# Patient Record
Sex: Male | Born: 1945
Health system: Southern US, Community
[De-identification: ages and names within clinical notes are randomized; demographics above are authoritative.]

## PROBLEM LIST (undated history)

## (undated) DIAGNOSIS — T783XXA Angioneurotic edema, initial encounter: Secondary | ICD-10-CM

## (undated) DIAGNOSIS — N400 Enlarged prostate without lower urinary tract symptoms: Secondary | ICD-10-CM

## (undated) DIAGNOSIS — H9190 Unspecified hearing loss, unspecified ear: Secondary | ICD-10-CM

## (undated) DIAGNOSIS — G8929 Other chronic pain: Secondary | ICD-10-CM

## (undated) DIAGNOSIS — M1990 Unspecified osteoarthritis, unspecified site: Secondary | ICD-10-CM

## (undated) DIAGNOSIS — Z9289 Personal history of other medical treatment: Secondary | ICD-10-CM

## (undated) DIAGNOSIS — Z9889 Other specified postprocedural states: Secondary | ICD-10-CM

## (undated) DIAGNOSIS — I639 Cerebral infarction, unspecified: Secondary | ICD-10-CM

## (undated) DIAGNOSIS — I10 Essential (primary) hypertension: Secondary | ICD-10-CM

## (undated) DIAGNOSIS — G629 Polyneuropathy, unspecified: Secondary | ICD-10-CM

## (undated) DIAGNOSIS — M549 Dorsalgia, unspecified: Secondary | ICD-10-CM

## (undated) DIAGNOSIS — I219 Acute myocardial infarction, unspecified: Secondary | ICD-10-CM

## (undated) DIAGNOSIS — N2889 Other specified disorders of kidney and ureter: Secondary | ICD-10-CM

## (undated) DIAGNOSIS — F419 Anxiety disorder, unspecified: Secondary | ICD-10-CM

## (undated) DIAGNOSIS — Z9689 Presence of other specified functional implants: Secondary | ICD-10-CM

## (undated) HISTORY — PX: ELBOW SURGERY: SHX618

## (undated) HISTORY — PX: COLONOSCOPY: SHX174

## (undated) HISTORY — PX: JOINT REPLACEMENT: SHX530

## (undated) HISTORY — PX: HERNIA REPAIR: SHX51

## (undated) HISTORY — PX: TONSILLECTOMY: SUR1361

## (undated) HISTORY — PX: VASECTOMY: SHX75

---
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
## 2002-07-25 ENCOUNTER — Encounter: Admission: RE | Admit: 2002-07-25 | Discharge: 2002-10-23 | Payer: Self-pay | Admitting: Anesthesiology

## 2012-12-13 DIAGNOSIS — Z79899 Other long term (current) drug therapy: Secondary | ICD-10-CM | POA: Insufficient documentation

## 2012-12-13 DIAGNOSIS — Z79891 Long term (current) use of opiate analgesic: Secondary | ICD-10-CM | POA: Insufficient documentation

## 2012-12-13 DIAGNOSIS — M1990 Unspecified osteoarthritis, unspecified site: Secondary | ICD-10-CM | POA: Insufficient documentation

## 2012-12-13 DIAGNOSIS — G894 Chronic pain syndrome: Secondary | ICD-10-CM | POA: Insufficient documentation

## 2012-12-30 DIAGNOSIS — M5136 Other intervertebral disc degeneration, lumbar region: Secondary | ICD-10-CM | POA: Insufficient documentation

## 2012-12-30 DIAGNOSIS — M47814 Spondylosis without myelopathy or radiculopathy, thoracic region: Secondary | ICD-10-CM | POA: Insufficient documentation

## 2014-08-14 ENCOUNTER — Other Ambulatory Visit (HOSPITAL_COMMUNITY): Payer: Self-pay | Admitting: Anesthesiology

## 2014-08-14 ENCOUNTER — Ambulatory Visit (HOSPITAL_COMMUNITY)
Admission: RE | Admit: 2014-08-14 | Discharge: 2014-08-14 | Disposition: A | Discharge status: Home / Self Care | Payer: Medicare Other | Source: Ambulatory Visit | Attending: Anesthesiology | Admitting: Anesthesiology

## 2014-08-14 DIAGNOSIS — Z4589 Encounter for adjustment and management of other implanted devices: Secondary | ICD-10-CM | POA: Diagnosis present

## 2014-08-14 DIAGNOSIS — I700 Atherosclerosis of aorta: Secondary | ICD-10-CM | POA: Diagnosis not present

## 2014-08-14 DIAGNOSIS — Z9689 Presence of other specified functional implants: Secondary | ICD-10-CM

## 2014-08-14 DIAGNOSIS — M5135 Other intervertebral disc degeneration, thoracolumbar region: Secondary | ICD-10-CM | POA: Diagnosis not present

## 2014-08-14 DIAGNOSIS — M4855XA Collapsed vertebra, not elsewhere classified, thoracolumbar region, initial encounter for fracture: Secondary | ICD-10-CM | POA: Diagnosis not present

## 2014-09-27 ENCOUNTER — Encounter (HOSPITAL_COMMUNITY): Payer: Self-pay | Admitting: *Deleted

## 2014-09-27 MED ORDER — CEFAZOLIN SODIUM-DEXTROSE 2-3 GM-% IV SOLR
2.0000 g | INTRAVENOUS | Status: AC
  Administered 2014-09-28: 2 g via INTRAVENOUS
  Filled 2014-09-27: qty 50

## 2014-09-27 MED ORDER — DEXAMETHASONE SODIUM PHOSPHATE 4 MG/ML IJ SOLN
8.0000 mg | Freq: Once | INTRAMUSCULAR | Status: AC
  Administered 2014-09-28: 8 mg via INTRAVENOUS
  Filled 2014-09-27: qty 2

## 2014-09-27 MED ORDER — MUPIROCIN 2 % EX OINT
1.0000 "application " | TOPICAL_OINTMENT | Freq: Once | CUTANEOUS | Status: AC
  Administered 2014-09-28: 1 via TOPICAL
  Filled 2014-09-27: qty 22

## 2014-09-27 MED ORDER — ACETAMINOPHEN 10 MG/ML IV SOLN: 1000.0000 mg | Freq: Four times a day (QID) | INTRAVENOUS | Status: DC

## 2014-09-27 NOTE — Progress Notes (Signed)
[place] and requested EKG that was done recently. They will fax it to [place].  Pt's PCP is Dr. [name]. Pt states medical clearance was sent to Dr. Rolena Infante' office.  Pt denies cardiac history, chest pain or sob. States he had a stress test "years ago" and it was normal.

## 2014-09-27 NOTE — Anesthesia Preprocedure Evaluation (Addendum)
Anesthesia Evaluation  Patient identified by MRN, date of birth, ID band Patient awake    Reviewed: Allergy & Precautions, NPO status , Patient's Chart, lab work & pertinent test results, reviewed documented beta blocker date and time   Airway Mallampati: II       Dental  (+) Teeth Intact   Pulmonary Current Smoker,          Cardiovascular hypertension, Pt. on medications Rhythm:Regular     Neuro/Psych Anxiety    GI/Hepatic negative GI ROS, Neg liver ROS,   Endo/Other  negative endocrine ROS  Renal/GU negative Renal ROS     Musculoskeletal   Abdominal (+)  Abdomen: soft.    Peds  Hematology   Anesthesia Other Findings   Reproductive/Obstetrics                            Anesthesia Physical Anesthesia Plan  ASA: II  Anesthesia Plan: General   Post-op Pain Management:    Induction: Intravenous  Airway Management Planned: Oral ETT  Additional Equipment:   Intra-op Plan:   Post-operative Plan: Extubation in OR  Informed Consent: I have reviewed the patients History and Physical, chart, labs and discussed the procedure including the risks, benefits and alternatives for the proposed anesthesia with the patient or authorized representative who has indicated his/her understanding and acceptance.     Plan Discussed with:   Anesthesia Plan Comments: (Check AM Labs and Am EKG)        Anesthesia Quick Evaluation

## 2014-09-28 ENCOUNTER — Encounter (HOSPITAL_COMMUNITY)
Admission: RE | Disposition: A | Discharge status: Home / Self Care | Payer: Medicare Other | Source: Ambulatory Visit | Attending: Orthopedic Surgery

## 2014-09-28 ENCOUNTER — Ambulatory Visit (HOSPITAL_COMMUNITY): Payer: Medicare Other | Admitting: Anesthesiology

## 2014-09-28 ENCOUNTER — Observation Stay (HOSPITAL_COMMUNITY)
Admission: RE | Admit: 2014-09-28 | Discharge: 2014-09-29 | Disposition: A | Discharge status: Home / Self Care | Payer: Medicare Other | Source: Ambulatory Visit | Attending: Orthopedic Surgery | Admitting: Orthopedic Surgery

## 2014-09-28 ENCOUNTER — Encounter (HOSPITAL_COMMUNITY): Payer: Self-pay | Admitting: *Deleted

## 2014-09-28 ENCOUNTER — Ambulatory Visit (HOSPITAL_COMMUNITY): Payer: Medicare Other

## 2014-09-28 DIAGNOSIS — M1990 Unspecified osteoarthritis, unspecified site: Secondary | ICD-10-CM | POA: Diagnosis not present

## 2014-09-28 DIAGNOSIS — Z87891 Personal history of nicotine dependence: Secondary | ICD-10-CM | POA: Insufficient documentation

## 2014-09-28 DIAGNOSIS — F419 Anxiety disorder, unspecified: Secondary | ICD-10-CM | POA: Insufficient documentation

## 2014-09-28 DIAGNOSIS — M545 Low back pain: Secondary | ICD-10-CM | POA: Diagnosis not present

## 2014-09-28 DIAGNOSIS — M79604 Pain in right leg: Secondary | ICD-10-CM | POA: Insufficient documentation

## 2014-09-28 DIAGNOSIS — G894 Chronic pain syndrome: Principal | ICD-10-CM | POA: Insufficient documentation

## 2014-09-28 DIAGNOSIS — I10 Essential (primary) hypertension: Secondary | ICD-10-CM | POA: Insufficient documentation

## 2014-09-28 DIAGNOSIS — G629 Polyneuropathy, unspecified: Secondary | ICD-10-CM | POA: Insufficient documentation

## 2014-09-28 DIAGNOSIS — M79605 Pain in left leg: Secondary | ICD-10-CM | POA: Insufficient documentation

## 2014-09-28 DIAGNOSIS — N400 Enlarged prostate without lower urinary tract symptoms: Secondary | ICD-10-CM | POA: Diagnosis not present

## 2014-09-28 DIAGNOSIS — Z419 Encounter for procedure for purposes other than remedying health state, unspecified: Secondary | ICD-10-CM

## 2014-09-28 DIAGNOSIS — G8929 Other chronic pain: Secondary | ICD-10-CM | POA: Diagnosis present

## 2014-09-28 HISTORY — DX: Unspecified osteoarthritis, unspecified site: M19.90

## 2014-09-28 HISTORY — DX: Anxiety disorder, unspecified: F41.9

## 2014-09-28 HISTORY — DX: Personal history of other medical treatment: Z92.89

## 2014-09-28 HISTORY — DX: Essential (primary) hypertension: I10

## 2014-09-28 HISTORY — DX: Polyneuropathy, unspecified: G62.9

## 2014-09-28 HISTORY — PX: SPINAL CORD STIMULATOR INSERTION: SHX5378

## 2014-09-28 HISTORY — DX: Benign prostatic hyperplasia without lower urinary tract symptoms: N40.0

## 2014-09-28 LAB — CBC
HCT: 39 % (ref 39.0–52.0)
Hemoglobin: 13.3 g/dL (ref 13.0–17.0)
MCH: 29.9 pg (ref 26.0–34.0)
MCHC: 34.1 g/dL (ref 30.0–36.0)
MCV: 87.6 fL (ref 78.0–100.0)
PLATELETS: 206 10*3/uL (ref 150–400)
RBC: 4.45 MIL/uL (ref 4.22–5.81)
RDW: 12.8 % (ref 11.5–15.5)
WBC: 7.2 10*3/uL (ref 4.0–10.5)

## 2014-09-28 LAB — BASIC METABOLIC PANEL
Anion gap: 10 (ref 5–15)
BUN: 14 mg/dL (ref 6–23)
CO2: 26 mmol/L (ref 19–32)
CREATININE: 1.1 mg/dL (ref 0.50–1.35)
Calcium: 9.2 mg/dL (ref 8.4–10.5)
Chloride: 102 mEq/L (ref 96–112)
GFR calc Af Amer: 79 mL/min — ABNORMAL LOW (ref >90)
GFR calc non Af Amer: 68 mL/min — ABNORMAL LOW (ref >90)
GLUCOSE: 102 mg/dL — AB (ref 70–99)
Potassium: 4 mmol/L (ref 3.5–5.1)
Sodium: 138 mmol/L (ref 135–145)

## 2014-09-28 LAB — SURGICAL PCR SCREEN
MRSA, PCR: NEGATIVE
Staphylococcus aureus: NEGATIVE

## 2014-09-28 SURGERY — INSERTION, SPINAL CORD STIMULATOR, LUMBAR
Anesthesia: General | Site: Back

## 2014-09-28 MED ORDER — DOCUSATE SODIUM 100 MG PO CAPS: 100.0000 mg | ORAL_CAPSULE | Freq: Three times a day (TID) | ORAL | Status: DC | PRN

## 2014-09-28 MED ORDER — ROCURONIUM BROMIDE 50 MG/5ML IV SOLN
INTRAVENOUS | Status: AC
  Filled 2014-09-28: qty 1

## 2014-09-28 MED ORDER — METHOCARBAMOL 500 MG PO TABS: 500.0000 mg | ORAL_TABLET | Freq: Three times a day (TID) | ORAL | Status: DC | PRN

## 2014-09-28 MED ORDER — GABAPENTIN 600 MG PO TABS
1200.0000 mg | ORAL_TABLET | Freq: Every day | ORAL | Status: DC
  Administered 2014-09-28: 1200 mg via ORAL
  Filled 2014-09-28 (×2): qty 2

## 2014-09-28 MED ORDER — PHENOL 1.4 % MT LIQD: 1.0000 | OROMUCOSAL | Status: DC | PRN

## 2014-09-28 MED ORDER — PROMETHAZINE HCL 25 MG/ML IJ SOLN: 6.2500 mg | INTRAMUSCULAR | Status: DC | PRN

## 2014-09-28 MED ORDER — FENTANYL CITRATE 0.05 MG/ML IJ SOLN
INTRAMUSCULAR | Status: AC
  Filled 2014-09-28: qty 2

## 2014-09-28 MED ORDER — SODIUM CHLORIDE 0.9 % IJ SOLN
3.0000 mL | Freq: Two times a day (BID) | INTRAMUSCULAR | Status: DC
  Administered 2014-09-28 (×2): 3 mL via INTRAVENOUS

## 2014-09-28 MED ORDER — FENTANYL CITRATE 0.05 MG/ML IJ SOLN
25.0000 ug | INTRAMUSCULAR | Status: DC | PRN
  Administered 2014-09-28 (×3): 50 ug via INTRAVENOUS

## 2014-09-28 MED ORDER — METHOCARBAMOL 500 MG PO TABS
500.0000 mg | ORAL_TABLET | Freq: Four times a day (QID) | ORAL | Status: DC | PRN
  Administered 2014-09-28 – 2014-09-29 (×2): 500 mg via ORAL
  Filled 2014-09-28 (×2): qty 1

## 2014-09-28 MED ORDER — SODIUM CHLORIDE 0.9 % IJ SOLN: 3.0000 mL | INTRAMUSCULAR | Status: DC | PRN

## 2014-09-28 MED ORDER — MIDAZOLAM HCL 5 MG/5ML IJ SOLN
INTRAMUSCULAR | Status: DC | PRN
  Administered 2014-09-28: 2 mg via INTRAVENOUS

## 2014-09-28 MED ORDER — POLYETHYLENE GLYCOL 3350 17 GM/SCOOP PO POWD: 17.0000 g | Freq: Every day | ORAL | Status: DC

## 2014-09-28 MED ORDER — EPHEDRINE SULFATE 50 MG/ML IJ SOLN
INTRAMUSCULAR | Status: DC | PRN
  Administered 2014-09-28: 10 mg via INTRAVENOUS

## 2014-09-28 MED ORDER — PROPOFOL 10 MG/ML IV BOLUS
INTRAVENOUS | Status: AC
  Filled 2014-09-28: qty 20

## 2014-09-28 MED ORDER — NEOSTIGMINE METHYLSULFATE 10 MG/10ML IV SOLN
INTRAVENOUS | Status: AC
  Filled 2014-09-28: qty 1

## 2014-09-28 MED ORDER — BUPIVACAINE-EPINEPHRINE (PF) 0.25% -1:200000 IJ SOLN
INTRAMUSCULAR | Status: AC
  Filled 2014-09-28: qty 30

## 2014-09-28 MED ORDER — NEOSTIGMINE METHYLSULFATE 10 MG/10ML IV SOLN
INTRAVENOUS | Status: DC | PRN
  Administered 2014-09-28: 3 mg via INTRAVENOUS

## 2014-09-28 MED ORDER — ACETAMINOPHEN 10 MG/ML IV SOLN
INTRAVENOUS | Status: AC
  Filled 2014-09-28: qty 100

## 2014-09-28 MED ORDER — LACTATED RINGERS IV SOLN: INTRAVENOUS | Status: DC

## 2014-09-28 MED ORDER — FENTANYL CITRATE 0.05 MG/ML IJ SOLN
INTRAMUSCULAR | Status: AC
  Filled 2014-09-28: qty 5

## 2014-09-28 MED ORDER — LACTATED RINGERS IV SOLN
INTRAVENOUS | Status: DC | PRN
  Administered 2014-09-28 (×2): via INTRAVENOUS

## 2014-09-28 MED ORDER — MEPERIDINE HCL 25 MG/ML IJ SOLN: 6.2500 mg | INTRAMUSCULAR | Status: DC | PRN

## 2014-09-28 MED ORDER — GLYCOPYRROLATE 0.2 MG/ML IJ SOLN
INTRAMUSCULAR | Status: AC
  Filled 2014-09-28: qty 1

## 2014-09-28 MED ORDER — ONDANSETRON HCL 4 MG/2ML IJ SOLN
INTRAMUSCULAR | Status: AC
  Filled 2014-09-28: qty 2

## 2014-09-28 MED ORDER — AMLODIPINE BESYLATE 5 MG PO TABS
5.0000 mg | ORAL_TABLET | Freq: Every day | ORAL | Status: DC
  Administered 2014-09-29: 5 mg via ORAL
  Filled 2014-09-28: qty 1

## 2014-09-28 MED ORDER — BENAZEPRIL HCL 20 MG PO TABS
20.0000 mg | ORAL_TABLET | Freq: Every day | ORAL | Status: DC
  Administered 2014-09-28 – 2014-09-29 (×2): 20 mg via ORAL
  Filled 2014-09-28 (×2): qty 1

## 2014-09-28 MED ORDER — PROPOFOL 10 MG/ML IV BOLUS
INTRAVENOUS | Status: DC | PRN
  Administered 2014-09-28: 130 mg via INTRAVENOUS

## 2014-09-28 MED ORDER — ARTIFICIAL TEARS OP OINT
TOPICAL_OINTMENT | OPHTHALMIC | Status: AC
  Filled 2014-09-28: qty 3.5

## 2014-09-28 MED ORDER — DEXAMETHASONE SODIUM PHOSPHATE 4 MG/ML IJ SOLN
4.0000 mg | Freq: Four times a day (QID) | INTRAMUSCULAR | Status: DC
  Administered 2014-09-28: 4 mg via INTRAVENOUS
  Filled 2014-09-28 (×4): qty 1

## 2014-09-28 MED ORDER — GLYCOPYRROLATE 0.2 MG/ML IJ SOLN
INTRAMUSCULAR | Status: AC
  Filled 2014-09-28: qty 2

## 2014-09-28 MED ORDER — BUPIVACAINE-EPINEPHRINE 0.25% -1:200000 IJ SOLN
INTRAMUSCULAR | Status: DC | PRN
  Administered 2014-09-28: 10 mL

## 2014-09-28 MED ORDER — ROCURONIUM BROMIDE 100 MG/10ML IV SOLN
INTRAVENOUS | Status: DC | PRN
  Administered 2014-09-28: 30 mg via INTRAVENOUS

## 2014-09-28 MED ORDER — MIDAZOLAM HCL 2 MG/2ML IJ SOLN
INTRAMUSCULAR | Status: AC
  Filled 2014-09-28: qty 2

## 2014-09-28 MED ORDER — ONDANSETRON HCL 4 MG/2ML IJ SOLN: 4.0000 mg | INTRAMUSCULAR | Status: DC | PRN

## 2014-09-28 MED ORDER — FENTANYL CITRATE 0.05 MG/ML IJ SOLN
INTRAMUSCULAR | Status: DC | PRN
  Administered 2014-09-28 (×5): 50 ug via INTRAVENOUS

## 2014-09-28 MED ORDER — LIDOCAINE HCL (CARDIAC) 20 MG/ML IV SOLN
INTRAVENOUS | Status: DC | PRN
  Administered 2014-09-28: 100 mg via INTRAVENOUS

## 2014-09-28 MED ORDER — SODIUM CHLORIDE 0.9 % IJ SOLN
INTRAMUSCULAR | Status: AC
  Filled 2014-09-28: qty 10

## 2014-09-28 MED ORDER — DEXTROSE 5 % IV SOLN: 500.0000 mg | Freq: Four times a day (QID) | INTRAVENOUS | Status: DC | PRN

## 2014-09-28 MED ORDER — PHENYLEPHRINE HCL 10 MG/ML IJ SOLN
INTRAMUSCULAR | Status: DC | PRN
  Administered 2014-09-28 (×4): 80 ug via INTRAVENOUS
  Administered 2014-09-28 (×2): 40 ug via INTRAVENOUS

## 2014-09-28 MED ORDER — EPHEDRINE SULFATE 50 MG/ML IJ SOLN
INTRAMUSCULAR | Status: AC
  Filled 2014-09-28: qty 1

## 2014-09-28 MED ORDER — THROMBIN 20000 UNITS EX SOLR
CUTANEOUS | Status: AC
  Filled 2014-09-28: qty 20000

## 2014-09-28 MED ORDER — ARTIFICIAL TEARS OP OINT
TOPICAL_OINTMENT | OPHTHALMIC | Status: DC | PRN
  Administered 2014-09-28: 1 via OPHTHALMIC

## 2014-09-28 MED ORDER — OXYCODONE HCL 5 MG PO TABS
10.0000 mg | ORAL_TABLET | ORAL | Status: DC | PRN
  Administered 2014-09-28 – 2014-09-29 (×3): 10 mg via ORAL
  Filled 2014-09-28 (×3): qty 2

## 2014-09-28 MED ORDER — ONDANSETRON HCL 4 MG/2ML IJ SOLN
INTRAMUSCULAR | Status: DC | PRN
  Administered 2014-09-28: 4 mg via INTRAVENOUS

## 2014-09-28 MED ORDER — CEFAZOLIN SODIUM 1-5 GM-% IV SOLN
1.0000 g | Freq: Three times a day (TID) | INTRAVENOUS | Status: AC
  Administered 2014-09-28 (×2): 1 g via INTRAVENOUS
  Filled 2014-09-28 (×2): qty 50

## 2014-09-28 MED ORDER — MENTHOL 3 MG MT LOZG: 1.0000 | LOZENGE | OROMUCOSAL | Status: DC | PRN

## 2014-09-28 MED ORDER — DEXAMETHASONE 4 MG PO TABS
4.0000 mg | ORAL_TABLET | Freq: Four times a day (QID) | ORAL | Status: DC
  Administered 2014-09-28 – 2014-09-29 (×3): 4 mg via ORAL
  Filled 2014-09-28 (×7): qty 1

## 2014-09-28 MED ORDER — GELATIN ABSORBABLE 100 EX MISC
CUTANEOUS | Status: DC | PRN
  Administered 2014-09-28: 20 mL via TOPICAL

## 2014-09-28 MED ORDER — ACETAMINOPHEN 10 MG/ML IV SOLN
1000.0000 mg | Freq: Four times a day (QID) | INTRAVENOUS | Status: AC
  Administered 2014-09-28 – 2014-09-29 (×4): 1000 mg via INTRAVENOUS
  Filled 2014-09-28 (×5): qty 100

## 2014-09-28 MED ORDER — GLYCOPYRROLATE 0.2 MG/ML IJ SOLN
INTRAMUSCULAR | Status: DC | PRN
  Administered 2014-09-28: 0.4 mg via INTRAVENOUS

## 2014-09-28 MED ORDER — DEXAMETHASONE SODIUM PHOSPHATE 4 MG/ML IJ SOLN
INTRAMUSCULAR | Status: AC
  Filled 2014-09-28: qty 1

## 2014-09-28 MED ORDER — ONDANSETRON HCL 4 MG PO TABS: 4.0000 mg | ORAL_TABLET | Freq: Three times a day (TID) | ORAL | Status: DC | PRN

## 2014-09-28 MED ORDER — GABAPENTIN 600 MG PO TABS
600.0000 mg | ORAL_TABLET | Freq: Three times a day (TID) | ORAL | Status: DC
  Administered 2014-09-28 – 2014-09-29 (×3): 600 mg via ORAL
  Filled 2014-09-28 (×6): qty 1

## 2014-09-28 MED ORDER — LIDOCAINE HCL (CARDIAC) 20 MG/ML IV SOLN
INTRAVENOUS | Status: AC
  Filled 2014-09-28: qty 5

## 2014-09-28 MED ORDER — MORPHINE SULFATE 2 MG/ML IJ SOLN
1.0000 mg | INTRAMUSCULAR | Status: DC | PRN
  Administered 2014-09-28: 2 mg via INTRAVENOUS
  Filled 2014-09-28: qty 1

## 2014-09-28 MED ORDER — ACETAMINOPHEN 10 MG/ML IV SOLN
INTRAVENOUS | Status: DC | PRN
  Administered 2014-09-28: 1000 mg via INTRAVENOUS

## 2014-09-28 MED ORDER — HYDROMORPHONE HCL 1 MG/ML IJ SOLN
INTRAMUSCULAR | Status: AC
  Filled 2014-09-28: qty 1

## 2014-09-28 MED ORDER — HYDROMORPHONE HCL 1 MG/ML IJ SOLN
0.5000 mg | INTRAMUSCULAR | Status: AC | PRN
  Administered 2014-09-28 (×2): 0.5 mg via INTRAVENOUS

## 2014-09-28 MED ORDER — CITALOPRAM HYDROBROMIDE 20 MG PO TABS
20.0000 mg | ORAL_TABLET | Freq: Every day | ORAL | Status: DC
  Administered 2014-09-29: 20 mg via ORAL
  Filled 2014-09-28: qty 1

## 2014-09-28 SURGICAL SUPPLY — 56 items
CANISTER SUCTION 2500CC (MISCELLANEOUS) ×2 IMPLANT
CLSR STERI-STRIP ANTIMIC 1/2X4 (GAUZE/BANDAGES/DRESSINGS) ×4 IMPLANT
CORDS BIPOLAR (ELECTRODE) ×2 IMPLANT
COVER PROBE W GEL 5X96 (DRAPES) IMPLANT
DRAPE C-ARM 42X72 X-RAY (DRAPES) ×2 IMPLANT
DRAPE INCISE IOBAN 85X60 (DRAPES) ×2 IMPLANT
DRAPE SURG 17X23 STRL (DRAPES) ×2 IMPLANT
DRAPE U-SHAPE 47X51 STRL (DRAPES) ×2 IMPLANT
DRSG MEPILEX BORDER 4X4 (GAUZE/BANDAGES/DRESSINGS) ×2 IMPLANT
DRSG MEPILEX BORDER 4X8 (GAUZE/BANDAGES/DRESSINGS) ×2 IMPLANT
DURAPREP 26ML APPLICATOR (WOUND CARE) ×2 IMPLANT
ELECT CAUTERY BLADE 6.4 (BLADE) ×2 IMPLANT
ELECT PENCIL ROCKER SW 15FT (MISCELLANEOUS) ×2 IMPLANT
ELECT REM PT RETURN 9FT ADLT (ELECTROSURGICAL) ×2
ELECTRODE REM PT RTRN 9FT ADLT (ELECTROSURGICAL) ×1 IMPLANT
GLOVE BIOGEL PI IND STRL 8 (GLOVE) ×1 IMPLANT
GLOVE BIOGEL PI IND STRL 8.5 (GLOVE) ×1 IMPLANT
GLOVE BIOGEL PI INDICATOR 8 (GLOVE) ×1
GLOVE BIOGEL PI INDICATOR 8.5 (GLOVE) ×1
GLOVE ECLIPSE 8.5 STRL (GLOVE) ×4 IMPLANT
GLOVE ORTHO TXT STRL SZ7.5 (GLOVE) ×2 IMPLANT
GOWN STRL REUS W/ TWL XL LVL3 (GOWN DISPOSABLE) ×2 IMPLANT
GOWN STRL REUS W/TWL 2XL LVL3 (GOWN DISPOSABLE) ×4 IMPLANT
GOWN STRL REUS W/TWL XL LVL3 (GOWN DISPOSABLE) ×2
IPG PROTEGE MRI (Neuro Prosthesis/Implant) ×2 IMPLANT
KIT BASIN OR (CUSTOM PROCEDURE TRAY) ×2 IMPLANT
KIT ROOM TURNOVER OR (KITS) ×2 IMPLANT
LAMI NARROW PRIPOLE 16CH (Orthopedic Implant) ×2 IMPLANT
NDL SUT 6 .5 CRC .975X.05 MAYO (NEEDLE) ×1 IMPLANT
NEEDLE 22X1 1/2 (OR ONLY) (NEEDLE) ×2 IMPLANT
NEEDLE MAYO TAPER (NEEDLE) ×1
NEEDLE SPNL 18GX3.5 QUINCKE PK (NEEDLE) ×6 IMPLANT
NS IRRIG 1000ML POUR BTL (IV SOLUTION) ×2 IMPLANT
PACK LAMINECTOMY ORTHO (CUSTOM PROCEDURE TRAY) ×2 IMPLANT
PACK UNIVERSAL I (CUSTOM PROCEDURE TRAY) ×2 IMPLANT
PAD ARMBOARD 7.5X6 YLW CONV (MISCELLANEOUS) ×4 IMPLANT
PATIENT PROGRAMMER PROTEGE MRI (MISCELLANEOUS) ×2 IMPLANT
SPONGE LAP 4X18 X RAY DECT (DISPOSABLE) IMPLANT
SPONGE SURGIFOAM ABS GEL 100 (HEMOSTASIS) ×2 IMPLANT
STAPLER VISISTAT 35W (STAPLE) ×2 IMPLANT
SURGIFLO TRUKIT (HEMOSTASIS) IMPLANT
SUT BONE WAX W31G (SUTURE) ×2 IMPLANT
SUT FIBERWIRE #2 38 REV NDL BL (SUTURE) ×2
SUT MON AB 3-0 SH 27 (SUTURE) ×2
SUT MON AB 3-0 SH27 (SUTURE) ×2 IMPLANT
SUT VIC AB 1 CT1 27 (SUTURE) ×3
SUT VIC AB 1 CT1 27XBRD ANBCTR (SUTURE) ×3 IMPLANT
SUT VIC AB 2-0 CT1 18 (SUTURE) ×2 IMPLANT
SUTURE FIBERWR#2 38 REV NDL BL (SUTURE) ×1 IMPLANT
SYR BULB IRRIGATION 50ML (SYRINGE) ×2 IMPLANT
SYR CONTROL 10ML LL (SYRINGE) ×2 IMPLANT
SYSTEM CHARGING PRODIGY (MISCELLANEOUS) ×2 IMPLANT
TOWEL OR 17X24 6PK STRL BLUE (TOWEL DISPOSABLE) ×2 IMPLANT
TOWEL OR 17X26 10 PK STRL BLUE (TOWEL DISPOSABLE) ×2 IMPLANT
TRAY FOLEY CATH 16FRSI W/METER (SET/KITS/TRAYS/PACK) IMPLANT
WATER STERILE IRR 1000ML POUR (IV SOLUTION) ×2 IMPLANT

## 2014-09-28 NOTE — H&P (Signed)
History of Present Illness  The patient is a 69 year old male who comes in today for a preoperative History and Physical. The patient is scheduled for a spinal cord stim placement to be performed by Dr. [name] D. Rolena Infante, MD at [facility] on 09-28-14 . Please see the hospital record for complete dictated history and physical.  Allergies  No Known Drug Allergies12/15/2015  Family History  Heart Disease Father. Cerebrovascular Accident Mother. Cancer Maternal Grandfather.  Social History  No history of drug/alcohol rehab Marital status married Tobacco / smoke exposure 08/29/2014: no Under pain contract Tobacco use Former smoker. 08/29/2014: smoke(d) 1 pack(s) per day Current work status disabled Children 3 Exercise Exercises rarely Living situation live with spouse Former drinker 08/29/2014: In the past drank  Medication History AmLODIPine Besylate (5MG  Tablet, Oral) Active. Benazepril HCl (20MG  Tablet, Oral) Active. Citalopram Hydrobromide (20MG  Tablet, Oral) Active. Gabapentin (600MG  Tablet, Oral) Active. Ibuprofen (800MG  Tablet, Oral) Active. Nucynta ER (200MG  Tablet ER 12HR, Oral) Active. Medications Reconciled  Past Surgical History Total Knee Replacement left Vasectomy Total Hip Replacement right Carpal Tunnel Repair right Other Orthopaedic Surgery  Other Problems Osteoarthritis Peripheral Neuropathy Anxiety Disorder High blood pressure  Vitals  09/25/2014 7:52 AM Weight: 188 lb Height: 68.5in Body Surface Area: 2 m Body Mass Index: 28.17 kg/m  Temp.: 64F(Oral)  Pulse: 100 (Regular)  BP: 122/96 (Sitting, Left Arm, Standard)  Physical Exam  General Mental Status -Alert and cooperative. General Appearance-Very pleasant.  Chest and Lung Exam Palpation Palpation of the chest reveals - Non-tender. Auscultation Breath sounds - Normal.  Cardiovascular Auscultation Rhythm - Regular.  Peripheral  Vascular Lower Extremity Palpation - Pulses - Bilateral - diminished. Calf - Bilateral - soft/supple to palpation, appearance is not suggestive of DVT.  Neurologic Neurologic evaluation reveals -alert and oriented x 3 with no impairment of recent or remote memory. Motor Strength - 4/5 reduced muscle strength - All Muscles(genralized weakness due to pain). Lower Extremity - Bilateral - Motor function is diminished in the lower extremity. Sensation Lower Extremity - Bilateral - sensation is diminished to light touch in the lower extremity. Reflexes 1/2 Decreased - All. Babinski - Bilateral - Babinski not present. Ankle Clonus - Bilateral - Ankle clonus not present. Clonus - Bilateral - clonus not present. Testing Hoffman's Sign - No Hoffman's sign present.   Thoracic MRI: no significant spinal stenosis Multilevel moderate to significant facet arthrosis No contraindication with placement  Assessment & Plan  Note:Plan on SCS placement for chronic pain T8 placement during trial MRI of T-spine shows multilevel facet arthrosis and mild stenosis No contra-indication to SCS placement Plan on T9 laminectomy for T8 placement  All of their questions were addressed. The risks include infection, bleeding, nerve damage, death, stroke, paralysis, migration, blood clots, ongoing or worsening pain, the need for removal of revision of the implants. All questions were encouraged and addressed. We will get preoperative medical clearance from his primary care physician and we will plan on doing surgery in the near future.

## 2014-09-28 NOTE — Transfer of Care (Signed)
Immediate Anesthesia Transfer of Care Note  Patient: William Jordan  Procedure(s) Performed: Procedure(s): LUMBAR SPINAL CORD STIMULATOR INSERTION (N/A)  Patient Location: PACU  Anesthesia Type:General  Level of Consciousness: awake, alert , oriented and sedated  Airway & Oxygen Therapy: Patient Spontanous Breathing and Patient connected to nasal cannula oxygen  Post-op Assessment: Report given to PACU RN, Post -op Vital signs reviewed and stable and Patient moving all extremities  Post vital signs: Reviewed and stable  Complications: No apparent anesthesia complications

## 2014-09-28 NOTE — Op Note (Signed)
NAMENICHOLAS, TROMPETER NO.:  [id number]  MEDICAL RECORD NO.:  [id number]  LOCATION:  3C08C                        FACILITY:  [facility]  PHYSICIAN:  Dahlia Bailiff, MD    DATE OF BIRTH:  [birth year]  DATE OF PROCEDURE:  09/28/2014 DATE OF DISCHARGE:                              OPERATIVE REPORT   PREOPERATIVE DIAGNOSIS:  Chronic pain syndrome lower back and legs.  POSTOPERATIVE DIAGNOSIS:  Chronic pain syndrome lower back and legs.  OPERATIVE PROCEDURES:  Implantation of spinal cord stimulator.  This is a St. Jude implant with a Tripole lead placed through a T9 laminotomy with right-sided battery placement.  COMPLICATIONS:  None.  CONDITION:  Stable.  HISTORY:  This is a very pleasant 69 year old gentleman, who has had chronic back, buttock, and bilateral leg pain.  Despite long-term pain medical management and conservative care, he continued to suffer.  As a result, the trial lead was placed and he had excellent response.  As a result of the positive response, we elected to convert him to a permanent implant.  All appropriate risks, benefits, and alternatives to surgery were discussed with the patient and his wife, and consent was obtained.  OPERATIVE NOTE:  The patient was brought to the operating room, placed supine on the operating table.  After successful induction of general anesthesia and endotracheal intubation, TEDs, SCDs were applied.  He was turned prone onto the Wilson frame and all bony prominences were well padded.  The back was prepped and draped in a standard fashion.  A time- out was taken, confirming the patient procedure and all other pertinent important data.  Once this was completed, I then used the C-arm to account up in the lateral plane from L5 up to T9.  Once identified the T10 vertebral body, I then infiltrated the midline from T9 to T11 and then with a battery.  The skin was actually infiltrated with 0.25% Marcaine.  A midline  incision was made.  Sharp dissection was carried out down to the deep fascia.  Deep fascia was sharply incised and I stripped the paraspinal muscles to expose the spinous process and lamina of T9, T10, and a portion of that at T11.  I then recounted up from L5 using lateral fluoroscopy to confirm this T9 level.  Once this was confirmed, I then used a double-action Leksell rongeur to remove the inferior portion of the T9 spinous process.  I then used a small curette to get a plane to develop a plane underneath the T9 lamina and then using a 2 mm Kerrison rongeur, I performed a laminotomy of T9.  I then used my Penfield 4 to gently dissect through the epidural fat to expose the dorsal surface of the thecal sac.  Once I had this exposed, I then used my trial dura spatula and was able to easily pass superiorly to the T7 vertebral body.  The patient did have a scoliotic deformity, but I was able to easily pass the dura spatula.  I then obtained the actual implant and gently inserted it.  He was able to freely pass it without any tension or difficulty.  I then confirmed  with x-ray that it was properly positioned.  Once this was done, I then secured the lead directly to the spinous process of T9 using FiberWire. I secured it through bone holes.  I then wrapped it around it.  I then dissected the T10 and T11 interspinous ligament and wrapped the leads around the spinous process of T9 up T10.  At this point, they were secured and the likelihood of migration was low now.  I then made a second incision over the gluteal site, dissected down 2.5 cm and created a pocket.  I then used the submuscular Passer to pass a tube from the thoracic to the gluteal incision and then I passed the leads.  I then secured them to the battery and then placed the battery into the pocket that I created.  The excess amount of leads was wrapped underneath the battery.  I then secured the battery to the deep fascia with  interrupted #1 Vicryl sutures.  The battery of course was tested by the [facility]. Jude's rep and it was functioning without any issues.  Both wounds were now copiously irrigated with normal saline in the deep fascia.  The deep fascia of the battery site was closed with #1 interrupted Vicryl suture, and then superficial with 2-0 Vicryl sutures, and a 3-0 Monocryl.  At the thoracic wound, it was irrigated. Hemostasis was obtained using bipolar electrocautery.  There was no active bleeding noted.  I then used the #1 __________ barbed suture to close the deep fascia.  I then used a 2-0 Vicryl suture and a 3-0 Monocryl in the skin.  Steri-Strips and a dry dressings were applied and the patient was extubated and transferred to the PACU without incident. At the end of the case, all needle and sponge counts were correct. There was no adverse intraoperative events.     Dahlia Bailiff, MD     DDB/MEDQ  D:  09/28/2014  T:  09/28/2014  Job:  [id number]

## 2014-09-28 NOTE — Progress Notes (Signed)
PT Cancellation Note  Patient Details Name: William Jordan MRN: [id number] DOB: 1945-12-27   Cancelled Treatment:    Reason Eval/Treat Not Completed: Other (comment) (pt wanted to wait until after lunch.  Return as able. )   Irwin Brakeman F 09/28/2014, 12:04 PM  [facility] [phone] (pager)

## 2014-09-28 NOTE — Anesthesia Postprocedure Evaluation (Signed)
  Anesthesia Post-op Note  Patient: William Jordan Section  Procedure(s) Performed: Procedure(s): LUMBAR SPINAL CORD STIMULATOR INSERTION (N/A)  Patient Location: PACU  Anesthesia Type:General  Level of Consciousness: awake and alert   Airway and Oxygen Therapy: Patient Spontanous Breathing and Patient connected to nasal cannula oxygen  Post-op Pain: mild  Post-op Assessment: Post-op Vital signs reviewed, Patient's Cardiovascular Status Stable, Respiratory Function Stable, Patent Airway and No signs of Nausea or vomiting  Post-op Vital Signs: Reviewed and stable  Last Vitals:  Filed Vitals:   09/28/14 1047  BP: 137/82  Pulse: 91  Temp: 36.6 C  Resp: 18    Complications: No apparent anesthesia complications

## 2014-09-28 NOTE — Plan of Care (Signed)
Problem: Consults Goal: Diagnosis - Spinal Surgery Outcome: Completed/Met Date Met:  09/28/14 LUMBAR SPINAL CORD STIMULATOR INSERTION

## 2014-09-28 NOTE — Brief Op Note (Signed)
09/28/2014  9:29 AM  PATIENT:  William Jordan  69 y.o. male  PRE-OPERATIVE DIAGNOSIS:  chronic lower extermity back pain  POST-OPERATIVE DIAGNOSIS:  chronic lower extermity back pain  PROCEDURE:  Procedure(s): LUMBAR SPINAL CORD STIMULATOR INSERTION (N/A)  SURGEON:  Surgeon(s) and Role:    * Melina Schools, MD - Primary  PHYSICIAN ASSISTANT:   ASSISTANTS: none   ANESTHESIA:   general  EBL:  Total I/O In: 1000 [I.V.:1000] Out: -   BLOOD ADMINISTERED:none  DRAINS: none   LOCAL MEDICATIONS USED:  MARCAINE     SPECIMEN:  No Specimen  DISPOSITION OF SPECIMEN:  N/A  COUNTS:  YES  TOURNIQUET:  * No tourniquets in log *  DICTATION: .Other Dictation: Dictation Number [id number]  PLAN OF CARE: Admit for overnight observation  PATIENT DISPOSITION:  PACU - hemodynamically stable.

## 2014-09-28 NOTE — Progress Notes (Signed)
   09/28/14 0631  OBSTRUCTIVE SLEEP APNEA  Have you ever been diagnosed with sleep apnea through a sleep study? No  Do you snore loudly (loud enough to be heard through closed doors)?  0  Do you often feel tired, fatigued, or sleepy during the daytime? 0  Has anyone observed you stop breathing during your sleep? 0  Do you have, or are you being treated for high blood pressure? 1  BMI more than 35 kg/m2? 0  Age over 67 years old? 1  Neck circumference greater than 40 cm/16 inches? 1  Gender: 1  Obstructive Sleep Apnea Score 4  Score 4 or greater  Results sent to PCP

## 2014-09-29 ENCOUNTER — Encounter (HOSPITAL_COMMUNITY): Payer: Self-pay | Admitting: Orthopedic Surgery

## 2014-09-29 DIAGNOSIS — G609 Hereditary and idiopathic neuropathy, unspecified: Secondary | ICD-10-CM | POA: Insufficient documentation

## 2014-09-29 DIAGNOSIS — G894 Chronic pain syndrome: Secondary | ICD-10-CM | POA: Diagnosis not present

## 2014-09-29 MED ORDER — OXYCODONE-ACETAMINOPHEN 10-325 MG PO TABS: 1.0000 | ORAL_TABLET | ORAL | Status: DC | PRN

## 2014-09-29 MED FILL — Thrombin For Soln 20000 Unit: CUTANEOUS | Qty: 1 | Status: AC

## 2014-09-29 NOTE — Progress Notes (Signed)
PT Cancellation Note  Patient Details Name: William Jordan MRN: [id number] DOB: 08/07/1946   Cancelled Treatment:    Reason Eval/Treat Not Completed: PT screened, no needs identified, will sign off.  Thanks.   Irwin Brakeman F 09/29/2014, 11:15 AM [facility] [phone] (pager)

## 2014-09-29 NOTE — Progress Notes (Signed)
IV Placement not documented. IV taken out this am location LT. Wrist. By nurse tech . Tomma Rakers RN

## 2014-09-29 NOTE — Discharge Summary (Signed)
Patient ID: William Jordan MRN: [id number] DOB/AGE: 67-20-1947 67 y.o.  Admit date: 09/28/2014 Discharge date: 09/29/2014  Admission Diagnoses:  Active Problems:   Chronic pain   Discharge Diagnoses:  Active Problems:   Chronic pain  status post Procedure(s): LUMBAR SPINAL CORD STIMULATOR INSERTION  Past Medical History  Diagnosis Date  . Hypertension   . Anxiety     takes Celexa  . Enlarged prostate   . Neuropathy   . Arthritis   . History of blood transfusion     Surgeries: Procedure(s): LUMBAR SPINAL CORD STIMULATOR INSERTION on 09/28/2014   Consultants:    Discharged Condition: Improved  Hospital Course: William Jordan is an 69 y.o. male who was admitted 09/28/2014 for operative treatment of chronic pain - placement of SCS. Patient failed conservative treatments (please see the history and physical for the specifics) and had severe unremitting pain that affects sleep, daily activities and work/hobbies. After pre-op clearance, the patient was taken to the operating room on 09/28/2014 and underwent  Procedure(s): Aten.    Patient was given perioperative antibiotics: Anti-infectives    Start     Dose/Rate Route Frequency Ordered Stop   09/28/14 1145  ceFAZolin (ANCEF) IVPB 1 g/50 mL premix     1 g100 mL/hr over 30 Minutes Intravenous Every 8 hours 09/28/14 1122 09/28/14 1941   09/27/14 1425  ceFAZolin (ANCEF) IVPB 2 g/50 mL premix     2 g100 mL/hr over 30 Minutes Intravenous 30 min pre-op 09/27/14 1425 09/28/14 0752       Patient was given sequential compression devices and early ambulation to prevent DVT.   Patient benefited maximally from hospital stay and there were no complications. At the time of discharge, the patient was urinating/moving their bowels without difficulty, tolerating a regular diet, pain is controlled with oral pain medications and they have been cleared by PT/OT.   Recent vital signs: Patient Vitals for the past  24 hrs:  BP Temp Temp src Pulse Resp SpO2  09/29/14 0812 (!) 148/85 mmHg 97.8 F (36.6 C) Oral 77 20 98 %  09/29/14 0411 130/78 mmHg 98.2 F (36.8 C) Oral 78 20 96 %  09/29/14 0027 112/63 mmHg 98.3 F (36.8 C) Oral 78 18 97 %  09/28/14 2028 125/76 mmHg 98.6 F (37 C) Oral 86 18 94 %  09/28/14 1545 122/72 mmHg 98.5 F (36.9 C) - 76 18 95 %  09/28/14 1240 132/76 mmHg 97.8 F (36.6 C) Oral 82 20 98 %  09/28/14 1047 137/82 mmHg 97.8 F (36.6 C) Axillary 91 18 99 %  09/28/14 1034 - 97 F (36.1 C) - 95 - 98 %  09/28/14 1031 131/90 mmHg - - 85 11 98 %  09/28/14 1030 - - - 88 15 95 %  09/28/14 1022 (!) 139/112 mmHg - - 90 20 96 %  09/28/14 1015 - - - 93 15 (!) 86 %  09/28/14 1010 - - - 88 13 92 %  09/28/14 1007 128/75 mmHg - - 89 (!) 24 93 %  09/28/14 1005 - - - 89 (!) 23 91 %  09/28/14 1000 - - - 91 16 92 %  09/28/14 0952 (!) 119/105 mmHg - - 92 (!) 24 95 %  09/28/14 0951 - 98 F (36.7 C) - 95 19 99 %     Recent laboratory studies:  Recent Labs  09/28/14 0628  WBC 7.2  HGB 13.3  HCT 39.0  PLT 206  NA 138  K 4.0  CL 102  CO2 26  BUN 14  CREATININE 1.10  GLUCOSE 102*  CALCIUM 9.2     Discharge Medications:     Medication List    STOP taking these medications        citalopram 20 MG tablet  Commonly known as:  CELEXA     ibuprofen 800 MG tablet  Commonly known as:  ADVIL,MOTRIN      TAKE these medications        amLODipine 5 MG tablet  Commonly known as:  NORVASC  Take 5 mg by mouth daily.     benazepril 20 MG tablet  Commonly known as:  LOTENSIN  Take 20 mg by mouth daily.     docusate sodium 100 MG capsule  Commonly known as:  COLACE  Take 1 capsule (100 mg total) by mouth 3 (three) times daily as needed for mild constipation.     gabapentin 600 MG tablet  Commonly known as:  NEURONTIN  Take 600-1,200 mg by mouth 4 (four) times daily -  before meals and at bedtime. 600mg  with meals and 1200mg  at bedtime     methocarbamol 500 MG tablet    Commonly known as:  ROBAXIN  Take 1 tablet (500 mg total) by mouth 3 (three) times daily as needed for muscle spasms.     NUCYNTA ER 200 MG Tb12  Generic drug:  Tapentadol HCl  Take 200 mg by mouth every 12 (twelve) hours.     ondansetron 4 MG tablet  Commonly known as:  ZOFRAN  Take 1 tablet (4 mg total) by mouth every 8 (eight) hours as needed for nausea or vomiting.     oxyCODONE-acetaminophen 10-325 MG per tablet  Commonly known as:  PERCOCET  Take 1 tablet by mouth every 4 (four) hours as needed for pain.     polyethylene glycol powder powder  Commonly known as:  GLYCOLAX  Take 17 g by mouth daily.     psyllium 58.6 % packet  Commonly known as:  METAMUCIL  Take 1 packet by mouth 2 (two) times daily.        Diagnostic Studies: Dg Thoracic Spine 2 View  09/28/2014   CLINICAL DATA:  T9 laminectomies for T10 stimulator placement  EXAM: DG C-ARM 61-120 MIN; THORACIC SPINE - 2 VIEW  TECHNIQUE: Two fluoroscopic spot images  FLUOROSCOPY TIME:  32 seconds  COMPARISON:  08/14/2014  FINDINGS: Spinal stimulator projects over the lower thoracic spine.  IMPRESSION: Stimulator placement   Electronically Signed   By: Skipper Cliche M.D.   On: 09/28/2014 09:51   Dg C-arm 1-60 Min  09/28/2014   CLINICAL DATA:  T9 laminectomies for T10 stimulator placement  EXAM: DG C-ARM 61-120 MIN; THORACIC SPINE - 2 VIEW  TECHNIQUE: Two fluoroscopic spot images  FLUOROSCOPY TIME:  32 seconds  COMPARISON:  08/14/2014  FINDINGS: Spinal stimulator projects over the lower thoracic spine.  IMPRESSION: Stimulator placement   Electronically Signed   By: Skipper Cliche M.D.   On: 09/28/2014 09:51          Follow-up Information    Follow up with Melina Schools D, MD. Schedule an appointment as soon as possible for a visit in 2 weeks.   Specialty:  Orthopedic Surgery   Why:  If symptoms worsen, For suture removal, For wound re-check   Contact information:   [address] [place]  [address] [phone]       Discharge Plan:  discharge to home    Disposition: stable    Signed: Daila Elbert D for Dr. Melina Schools [facility] [phone] 09/29/2014, 8:26 AM

## 2014-09-29 NOTE — Clinical Social Work Note (Signed)
Clinical Social Worker received referral for possible ST-SNF placement. Chart reviewed. PT/OT have not evaluated patient, however discharge summary is complete. Spoke with RN Case Manager who will follow up with patient to discuss home health needs if needed prior to discharge.   CSW signing off - please re consult if social work needs arise.  Barbette Or, [place]

## 2014-09-29 NOTE — Progress Notes (Signed)
Occupational Therapy Evaluation Patient Details Name: William Jordan MRN: [id number] DOB: March 09, 1946 Today's Date: 09/29/2014    History of Present Illness s/p spinal simulator placement   Clinical Impression   Completed all education. Pt verbalized understanding. Pt ready for D/C when medically stable.     Follow Up Recommendations  No OT follow up;Supervision - Intermittent    Equipment Recommendations  None recommended by OT    Recommendations for Other Services       Precautions / Restrictions Precautions Precautions: Fall Restrictions Weight Bearing Restrictions: No      Mobility Bed Mobility Overal bed mobility: Independent                Transfers Overall transfer level: Independent                    Balance Overall balance assessment: No apparent balance deficits (not formally assessed)                                          ADL Overall ADL's : At baseline                                       General ADL Comments: Educated on back precautions for comfort. discussed home safety and reducing risk of falls. Pt with apparent leg length discrepancy. Pt will address this on his own. Family will assist as needed.      Vision                     Perception     Praxis      Pertinent Vitals/Pain Pain Assessment: 0-10 Pain Score: 3  Pain Location: incision Pain Descriptors / Indicators: Aching Pain Intervention(s): Limited activity within patient's tolerance;Monitored during session     Hand Dominance Right   Extremity/Trunk Assessment Upper Extremity Assessment Upper Extremity Assessment: Overall WFL for tasks assessed   Lower Extremity Assessment Lower Extremity Assessment: Overall WFL for tasks assessed (prior weakness/numbness BLE)   Cervical / Trunk Assessment Cervical / Trunk Assessment: Normal   Communication Communication Communication: No difficulties   Cognition  Arousal/Alertness: Awake/alert Behavior During Therapy: WFL for tasks assessed/performed Overall Cognitive Status: Within Functional Limits for tasks assessed                     General Comments       Exercises       Shoulder Instructions      Home Living Family/patient expects to be discharged to:: Private residence Living Arrangements: Spouse/significant other Available Help at Discharge: Family;Available 24 hours/day Type of Home: House Home Access: Stairs to enter [employer] of Steps: 6 Entrance Stairs-Rails: Right;Left;Can reach both Home Layout: One level     Bathroom Shower/Tub: Occupational psychologist: Handicapped height Bathroom Accessibility: Yes How Accessible: Accessible via walker Home Equipment: [place] - 2 wheels;Cane - single point;Shower seat - built in;Grab bars - tub/shower;Hand held shower head          Prior Functioning/Environment Level of Independence: Independent with assistive device(s)             OT Diagnosis: Acute pain   OT Problem List: Decreased activity tolerance   OT Treatment/Interventions:      OT Goals(Current goals can be  found in the care plan section)    OT Frequency:     Barriers to D/C:            Co-evaluation              End of Session Nurse Communication: Mobility status  Activity Tolerance: Patient tolerated treatment well Patient left: in bed;with call bell/phone within reach;with family/visitor present   Time: 3662-9476 OT Time Calculation (min): 12 min Charges:  OT General Charges $OT Visit: 1 Procedure OT Evaluation $Initial OT Evaluation Tier I: 1 Procedure G-Codes: OT G-codes **NOT FOR INPATIENT CLASS** Functional Assessment Tool Used: clinical judgement Functional Limitation: Self care Self Care Current Status (L4650): At least 1 percent but less than 20 percent impaired, limited or restricted Self Care Goal Status (P5465): At least 1 percent but less than  20 percent impaired, limited or restricted Self Care Discharge Status [phone]): At least 1 percent but less than 20 percent impaired, limited or restricted  [facility] 09/29/2014, 11:21 AM   [facility], OTR/L  [phone] 09/29/2014

## 2014-09-29 NOTE — Progress Notes (Signed)
    Subjective: Procedure(s) (LRB): LUMBAR SPINAL CORD STIMULATOR INSERTION (N/A) 1 Day Post-Op  Patient reports pain as 3 on 0-10 scale.  Reports decreased leg pain reports incisional back pain   Positive void Negative bowel movement Positive flatus Negative chest pain or shortness of breath  Objective: Vital signs in last 24 hours: Temp:  [97 F (36.1 C)-98.6 F (37 C)] 97.8 F (36.6 C) (01/15 0812) Pulse Rate:  [76-95] 77 (01/15 0812) Resp:  [11-24] 20 (01/15 0812) BP: (112-148)/(63-112) 148/85 mmHg (01/15 0812) SpO2:  [86 %-99 %] 98 % (01/15 0812)  Intake/Output from previous day: 01/14 0701 - 01/15 0700 In: 3010 [P.O.:1260; I.V.:1750] Out: 35 [Blood:35]  Labs:  Recent Labs  09/28/14 0628  WBC 7.2  RBC 4.45  HCT 39.0  PLT 206    Recent Labs  09/28/14 0628  NA 138  K 4.0  CL 102  CO2 26  BUN 14  CREATININE 1.10  GLUCOSE 102*  CALCIUM 9.2   No results for input(s): LABPT, INR in the last 72 hours.  Physical Exam: Neurologically intact ABD soft Neurovascular intact Intact pulses distally Incision: dressing C/D/I Compartment soft  Assessment/Plan: Patient stable  xrays n/a Continue mobilization with physical therapy Continue care  Advance diet Up with therapy  D/c after stimulator has been activated   Melina Schools, MD [place] [phone],

## 2014-09-29 NOTE — Progress Notes (Signed)
Patient alert and oriented, mae's well, voiding adequate amount of urine, swallowing without difficulty, no c/o pain. Patient discharged home with family. Script and discharged instructions given to patient. Patient and family stated understanding of d/c instructions given and has an appointment with MD. Aisha Mallissa Lorenzen RN 

## 2015-04-03 DIAGNOSIS — M5440 Lumbago with sciatica, unspecified side: Secondary | ICD-10-CM | POA: Insufficient documentation

## 2015-04-03 DIAGNOSIS — Z96649 Presence of unspecified artificial hip joint: Secondary | ICD-10-CM | POA: Insufficient documentation

## 2015-05-24 ENCOUNTER — Encounter (HOSPITAL_COMMUNITY): Payer: Self-pay

## 2015-05-24 ENCOUNTER — Emergency Department (HOSPITAL_COMMUNITY): Payer: Medicare Other

## 2015-05-24 ENCOUNTER — Emergency Department (HOSPITAL_COMMUNITY)
Admission: EM | Admit: 2015-05-24 | Discharge: 2015-05-24 | Disposition: A | Discharge status: Home / Self Care | Payer: Medicare Other | Attending: Emergency Medicine | Admitting: Emergency Medicine

## 2015-05-24 DIAGNOSIS — M1990 Unspecified osteoarthritis, unspecified site: Secondary | ICD-10-CM | POA: Diagnosis not present

## 2015-05-24 DIAGNOSIS — Z720 Tobacco use: Secondary | ICD-10-CM | POA: Diagnosis not present

## 2015-05-24 DIAGNOSIS — Z79899 Other long term (current) drug therapy: Secondary | ICD-10-CM | POA: Insufficient documentation

## 2015-05-24 DIAGNOSIS — N410 Acute prostatitis: Secondary | ICD-10-CM

## 2015-05-24 DIAGNOSIS — F419 Anxiety disorder, unspecified: Secondary | ICD-10-CM | POA: Insufficient documentation

## 2015-05-24 DIAGNOSIS — I10 Essential (primary) hypertension: Secondary | ICD-10-CM | POA: Insufficient documentation

## 2015-05-24 DIAGNOSIS — N419 Inflammatory disease of prostate, unspecified: Secondary | ICD-10-CM | POA: Diagnosis not present

## 2015-05-24 DIAGNOSIS — G629 Polyneuropathy, unspecified: Secondary | ICD-10-CM | POA: Insufficient documentation

## 2015-05-24 DIAGNOSIS — R531 Weakness: Secondary | ICD-10-CM | POA: Diagnosis present

## 2015-05-24 LAB — COMPREHENSIVE METABOLIC PANEL
ALT: 27 U/L (ref 17–63)
ANION GAP: 10 (ref 5–15)
AST: 46 U/L — AB (ref 15–41)
Albumin: 3.7 g/dL (ref 3.5–5.0)
Alkaline Phosphatase: 103 U/L (ref 38–126)
BILIRUBIN TOTAL: 0.9 mg/dL (ref 0.3–1.2)
BUN: 8 mg/dL (ref 6–20)
CHLORIDE: 96 mmol/L — AB (ref 101–111)
CO2: 26 mmol/L (ref 22–32)
Calcium: 9.1 mg/dL (ref 8.9–10.3)
Creatinine, Ser: 0.78 mg/dL (ref 0.61–1.24)
GFR calc Af Amer: >60 mL/min (ref >60)
Glucose, Bld: 129 mg/dL — ABNORMAL HIGH (ref 65–99)
POTASSIUM: 3.7 mmol/L (ref 3.5–5.1)
Sodium: 132 mmol/L — ABNORMAL LOW (ref 135–145)
Total Protein: 6.8 g/dL (ref 6.5–8.1)

## 2015-05-24 LAB — URINALYSIS, ROUTINE W REFLEX MICROSCOPIC
Bilirubin Urine: NEGATIVE
GLUCOSE, UA: NEGATIVE mg/dL
Hgb urine dipstick: NEGATIVE
KETONES UR: 40 mg/dL — AB
LEUKOCYTES UA: NEGATIVE
Nitrite: NEGATIVE
PROTEIN: NEGATIVE mg/dL
Specific Gravity, Urine: 1.017 (ref 1.005–1.030)
Urobilinogen, UA: 1 mg/dL (ref 0.0–1.0)
pH: 8.5 — ABNORMAL HIGH (ref 5.0–8.0)

## 2015-05-24 LAB — CBC WITH DIFFERENTIAL/PLATELET
BASOS ABS: 0 10*3/uL (ref 0.0–0.1)
Basophils Relative: 0 % (ref 0–1)
Eosinophils Absolute: 0 10*3/uL (ref 0.0–0.7)
Eosinophils Relative: 0 % (ref 0–5)
HEMATOCRIT: 35.1 % — AB (ref 39.0–52.0)
Hemoglobin: 11.7 g/dL — ABNORMAL LOW (ref 13.0–17.0)
LYMPHS ABS: 0.8 10*3/uL (ref 0.7–4.0)
Lymphocytes Relative: 10 % — ABNORMAL LOW (ref 12–46)
MCH: 29 pg (ref 26.0–34.0)
MCHC: 33.3 g/dL (ref 30.0–36.0)
MCV: 87.1 fL (ref 78.0–100.0)
Monocytes Absolute: 0.7 10*3/uL (ref 0.1–1.0)
Monocytes Relative: 9 % (ref 3–12)
NEUTROS PCT: 81 % — AB (ref 43–77)
Neutro Abs: 5.9 10*3/uL (ref 1.7–7.7)
PLATELETS: 279 10*3/uL (ref 150–400)
RBC: 4.03 MIL/uL — ABNORMAL LOW (ref 4.22–5.81)
RDW: 14 % (ref 11.5–15.5)
WBC: 7.3 10*3/uL (ref 4.0–10.5)

## 2015-05-24 MED ORDER — CIPROFLOXACIN HCL 500 MG PO TABS: 500.0000 mg | ORAL_TABLET | Freq: Two times a day (BID) | ORAL | Status: DC

## 2015-05-24 MED ORDER — ONDANSETRON 4 MG PO TBDP: ORAL_TABLET | ORAL | Status: DC

## 2015-05-24 MED ORDER — CIPROFLOXACIN HCL 500 MG PO TABS
500.0000 mg | ORAL_TABLET | Freq: Once | ORAL | Status: AC
  Administered 2015-05-24: 500 mg via ORAL
  Filled 2015-05-24: qty 1

## 2015-05-24 MED ORDER — HYDROMORPHONE HCL 1 MG/ML IJ SOLN
1.0000 mg | Freq: Once | INTRAMUSCULAR | Status: AC
  Administered 2015-05-24: 1 mg via INTRAVENOUS
  Filled 2015-05-24: qty 1

## 2015-05-24 MED ORDER — SODIUM CHLORIDE 0.9 % IV BOLUS (SEPSIS)
1000.0000 mL | Freq: Once | INTRAVENOUS | Status: AC
  Administered 2015-05-24: 1000 mL via INTRAVENOUS

## 2015-05-24 MED ORDER — LORAZEPAM 2 MG/ML IJ SOLN
0.5000 mg | Freq: Once | INTRAMUSCULAR | Status: AC
  Administered 2015-05-24: 0.5 mg via INTRAVENOUS
  Filled 2015-05-24: qty 1

## 2015-05-24 NOTE — Progress Notes (Signed)
[facility] received phone call from patient's wife Judeen Hammans reporting zofran prescription not covered by patient's insurance.  EDCM called EDP Jacobowitz who ordered reglan10mg  tabs evry six hours prn 12 tablets no refills.  [facility] called prescription into [place] [phone] and spoke to pharmacist Lidia who reports reglan has been approved.  EDCM called patient's wife and informed her of above.  Patient's wife thankful for services.  No further EDCM needs at this time.

## 2015-05-24 NOTE — ED Provider Notes (Signed)
CSN: [id number]     Arrival date & time 05/24/15  1127 History   First MD Initiated Contact with Patient 05/24/15 1213     No chief complaint on file.    (Consider location/radiation/quality/duration/timing/severity/associated sxs/prior Treatment) Patient is a 69 y.o. male presenting with weakness. The history is provided by the patient (Patient complains of weakness. Also he has groin pain. Patient has not been eating or drinking much lately feels dehydrated. No fever chills).  Weakness This is a new problem. The current episode started 3 to 5 hours ago. The problem occurs constantly. The problem has not changed since onset.Pertinent negatives include no chest pain, no abdominal pain and no headaches. Nothing aggravates the symptoms. Nothing relieves the symptoms.    Past Medical History  Diagnosis Date  . Hypertension   . Anxiety     takes Celexa  . Enlarged prostate   . Neuropathy   . Arthritis   . History of blood transfusion    Past Surgical History  Procedure Laterality Date  . Joint replacement Left     knee, x 2  . Joint replacement Right     hip  . Joint replacement Left     partial shoulder  . Elbow surgery Right   . Hernia repair      umbilical  . Tonsillectomy    . Vasectomy    . Colonoscopy      one polyp removed - benign  . Spinal cord stimulator insertion N/A 09/28/2014    Procedure: LUMBAR SPINAL CORD STIMULATOR INSERTION;  Surgeon: Melina Schools, MD;  Location: [place];  Service: Orthopedics;  Laterality: N/A;   Family History  Problem Relation Age of Onset  . Heart disease Father    Social History  Substance Use Topics  . Smoking status: Current Every Day Smoker    Types: E-cigarettes  . Smokeless tobacco: Former Systems developer    Types: Snuff     Comment: does not use any tobacco products any more  . Alcohol Use: No    Review of Systems  Constitutional: Negative for appetite change and fatigue.  HENT: Negative for congestion, ear discharge and sinus  pressure.   Eyes: Negative for discharge.  Respiratory: Negative for cough.   Cardiovascular: Negative for chest pain.  Gastrointestinal: Negative for abdominal pain and diarrhea.  Genitourinary: Negative for frequency and hematuria.       Groin pain  Musculoskeletal: Negative for back pain.  Skin: Negative for rash.  Neurological: Positive for weakness. Negative for seizures and headaches.  Psychiatric/Behavioral: Negative for hallucinations.      Allergies  Review of patient's allergies indicates no known allergies.  Home Medications   Prior to Admission medications   Medication Sig Start Date End Date Taking? Authorizing Provider  amLODipine (NORVASC) 5 MG tablet Take 5 mg by mouth daily.    Historical Provider, MD  benazepril (LOTENSIN) 20 MG tablet Take 20 mg by mouth daily.    Historical Provider, MD  docusate sodium (COLACE) 100 MG capsule Take 1 capsule (100 mg total) by mouth 3 (three) times daily as needed for mild constipation. 09/28/14   Melina Schools, MD  gabapentin (NEURONTIN) 600 MG tablet Take 600-1,200 mg by mouth 4 (four) times daily -  before meals and at bedtime. 600mg  with meals and 1200mg  at bedtime    Historical Provider, MD  methocarbamol (ROBAXIN) 500 MG tablet Take 1 tablet (500 mg total) by mouth 3 (three) times daily as needed for muscle spasms. 09/28/14  Melina Schools, MD  ondansetron (ZOFRAN) 4 MG tablet Take 1 tablet (4 mg total) by mouth every 8 (eight) hours as needed for nausea or vomiting. 09/28/14   Melina Schools, MD  oxyCODONE-acetaminophen (PERCOCET) 10-325 MG per tablet Take 1 tablet by mouth every 4 (four) hours as needed for pain. 09/29/14   Melina Schools, MD  polyethylene glycol powder (GLYCOLAX) powder Take 17 g by mouth daily. 09/28/14   Melina Schools, MD  psyllium (METAMUCIL) 58.6 % packet Take 1 packet by mouth 2 (two) times daily.    Historical Provider, MD  Tapentadol HCl (NUCYNTA ER) 200 MG TB12 Take 200 mg by mouth every 12 (twelve) hours.     Historical Provider, MD   BP 133/85 mmHg  Pulse 104  Temp(Src) 97.9 F (36.6 C) (Oral)  Resp 22  Ht 5\' 9"  (1.753 m)  SpO2 93% Physical Exam  Constitutional: He is oriented to person, place, and time. He appears well-developed.  HENT:  Head: Normocephalic.  Mucous membranes dry  Eyes: Conjunctivae and EOM are normal. No scleral icterus.  Neck: Neck supple. No thyromegaly present.  Cardiovascular: Normal rate and regular rhythm.  Exam reveals no gallop and no friction rub.   No murmur heard. Pulmonary/Chest: No stridor. He has no wheezes. He has no rales. He exhibits no tenderness.  Abdominal: He exhibits no distension. There is no tenderness. There is no rebound.  Genitourinary:  Rectal,  Show tender prostate and enlarged prostate  Musculoskeletal: Normal range of motion. He exhibits no edema.  Lymphadenopathy:    He has no cervical adenopathy.  Neurological: He is oriented to person, place, and time. He exhibits normal muscle tone. Coordination normal.  Skin: No rash noted. No erythema.  Psychiatric: He has a normal mood and affect. His behavior is normal.    ED Course  Procedures (including critical care time) Labs Review Labs Reviewed  CBC WITH DIFFERENTIAL/PLATELET  COMPREHENSIVE METABOLIC PANEL  URINALYSIS, ROUTINE W REFLEX MICROSCOPIC (NOT AT [facility])    Imaging Review No results found. I have personally reviewed and evaluated these images and lab results as part of my medical decision-making.   EKG Interpretation None      MDM   Final diagnoses:  None    Diagnosis prostatitis will treat patient with Cipro and Zofran. Patient has been hydrated with 2 L of fluids. He will follow up with urology next week    Milton Ferguson, MD 05/24/15 1513

## 2015-05-24 NOTE — ED Notes (Signed)
Pt presents with 5 day h/o decreased appetite, no PO intake and generalized pain.  Pt was seen at [facility] for inability to void, had foley placed to empty bladder.  Pt denies any difficulty voiding since then, reports no energy, general malaise.

## 2015-05-24 NOTE — Discharge Instructions (Signed)
Follow up with [facility] in 1 week

## 2015-05-24 NOTE — ED Provider Notes (Signed)
Called by case management. Zofran not covered by Medicare. Switch prescription to Reglan 10 mg every 6 hours when necessary nausea dispensed 12 tablets no refills  Orlie Dakin, MD 05/24/15 1829

## 2015-05-25 ENCOUNTER — Telehealth (HOSPITAL_BASED_OUTPATIENT_CLINIC_OR_DEPARTMENT_OTHER): Payer: Self-pay | Admitting: Emergency Medicine

## 2015-06-05 ENCOUNTER — Inpatient Hospital Stay (HOSPITAL_COMMUNITY): Payer: Medicare Other

## 2015-06-05 ENCOUNTER — Emergency Department (HOSPITAL_COMMUNITY): Payer: Medicare Other

## 2015-06-05 ENCOUNTER — Inpatient Hospital Stay (HOSPITAL_COMMUNITY)
Admission: EM | Admit: 2015-06-05 | Discharge: 2015-06-12 | DRG: 871 | Disposition: A | Discharge status: Home / Self Care | Payer: Medicare Other | Attending: Internal Medicine | Admitting: Internal Medicine

## 2015-06-05 ENCOUNTER — Encounter (HOSPITAL_COMMUNITY): Payer: Self-pay | Admitting: *Deleted

## 2015-06-05 DIAGNOSIS — F17210 Nicotine dependence, cigarettes, uncomplicated: Secondary | ICD-10-CM | POA: Diagnosis present

## 2015-06-05 DIAGNOSIS — R4182 Altered mental status, unspecified: Secondary | ICD-10-CM | POA: Diagnosis present

## 2015-06-05 DIAGNOSIS — G894 Chronic pain syndrome: Secondary | ICD-10-CM | POA: Diagnosis present

## 2015-06-05 DIAGNOSIS — Z79899 Other long term (current) drug therapy: Secondary | ICD-10-CM

## 2015-06-05 DIAGNOSIS — Z8249 Family history of ischemic heart disease and other diseases of the circulatory system: Secondary | ICD-10-CM | POA: Diagnosis not present

## 2015-06-05 DIAGNOSIS — N179 Acute kidney failure, unspecified: Secondary | ICD-10-CM | POA: Diagnosis present

## 2015-06-05 DIAGNOSIS — Z781 Physical restraint status: Secondary | ICD-10-CM

## 2015-06-05 DIAGNOSIS — R918 Other nonspecific abnormal finding of lung field: Secondary | ICD-10-CM | POA: Diagnosis not present

## 2015-06-05 DIAGNOSIS — M79671 Pain in right foot: Secondary | ICD-10-CM | POA: Diagnosis not present

## 2015-06-05 DIAGNOSIS — I10 Essential (primary) hypertension: Secondary | ICD-10-CM | POA: Diagnosis present

## 2015-06-05 DIAGNOSIS — M109 Gout, unspecified: Secondary | ICD-10-CM | POA: Diagnosis present

## 2015-06-05 DIAGNOSIS — E860 Dehydration: Secondary | ICD-10-CM | POA: Diagnosis present

## 2015-06-05 DIAGNOSIS — F1120 Opioid dependence, uncomplicated: Secondary | ICD-10-CM | POA: Diagnosis present

## 2015-06-05 DIAGNOSIS — J189 Pneumonia, unspecified organism: Secondary | ICD-10-CM | POA: Diagnosis present

## 2015-06-05 DIAGNOSIS — R338 Other retention of urine: Secondary | ICD-10-CM | POA: Diagnosis present

## 2015-06-05 DIAGNOSIS — F329 Major depressive disorder, single episode, unspecified: Secondary | ICD-10-CM | POA: Diagnosis present

## 2015-06-05 DIAGNOSIS — Z96612 Presence of left artificial shoulder joint: Secondary | ICD-10-CM | POA: Diagnosis present

## 2015-06-05 DIAGNOSIS — E876 Hypokalemia: Secondary | ICD-10-CM | POA: Diagnosis present

## 2015-06-05 DIAGNOSIS — N401 Enlarged prostate with lower urinary tract symptoms: Secondary | ICD-10-CM | POA: Diagnosis present

## 2015-06-05 DIAGNOSIS — R509 Fever, unspecified: Secondary | ICD-10-CM

## 2015-06-05 DIAGNOSIS — G9340 Encephalopathy, unspecified: Secondary | ICD-10-CM | POA: Diagnosis not present

## 2015-06-05 DIAGNOSIS — K5900 Constipation, unspecified: Secondary | ICD-10-CM | POA: Diagnosis present

## 2015-06-05 DIAGNOSIS — M25571 Pain in right ankle and joints of right foot: Secondary | ICD-10-CM | POA: Diagnosis not present

## 2015-06-05 DIAGNOSIS — Z96641 Presence of right artificial hip joint: Secondary | ICD-10-CM | POA: Diagnosis present

## 2015-06-05 DIAGNOSIS — F419 Anxiety disorder, unspecified: Secondary | ICD-10-CM | POA: Diagnosis present

## 2015-06-05 DIAGNOSIS — M47814 Spondylosis without myelopathy or radiculopathy, thoracic region: Secondary | ICD-10-CM | POA: Diagnosis present

## 2015-06-05 DIAGNOSIS — M5136 Other intervertebral disc degeneration, lumbar region: Secondary | ICD-10-CM | POA: Diagnosis present

## 2015-06-05 DIAGNOSIS — T40605A Adverse effect of unspecified narcotics, initial encounter: Secondary | ICD-10-CM | POA: Diagnosis present

## 2015-06-05 DIAGNOSIS — G92 Toxic encephalopathy: Secondary | ICD-10-CM | POA: Diagnosis present

## 2015-06-05 DIAGNOSIS — A419 Sepsis, unspecified organism: Secondary | ICD-10-CM | POA: Diagnosis present

## 2015-06-05 DIAGNOSIS — E875 Hyperkalemia: Secondary | ICD-10-CM | POA: Diagnosis present

## 2015-06-05 DIAGNOSIS — Y95 Nosocomial condition: Secondary | ICD-10-CM | POA: Diagnosis present

## 2015-06-05 DIAGNOSIS — R52 Pain, unspecified: Secondary | ICD-10-CM

## 2015-06-05 DIAGNOSIS — G928 Other toxic encephalopathy: Secondary | ICD-10-CM | POA: Diagnosis present

## 2015-06-05 DIAGNOSIS — G8929 Other chronic pain: Secondary | ICD-10-CM | POA: Diagnosis not present

## 2015-06-05 DIAGNOSIS — R410 Disorientation, unspecified: Secondary | ICD-10-CM | POA: Diagnosis not present

## 2015-06-05 DIAGNOSIS — A4101 Sepsis due to Methicillin susceptible Staphylococcus aureus: Secondary | ICD-10-CM | POA: Diagnosis present

## 2015-06-05 DIAGNOSIS — M51369 Other intervertebral disc degeneration, lumbar region without mention of lumbar back pain or lower extremity pain: Secondary | ICD-10-CM | POA: Diagnosis present

## 2015-06-05 LAB — URINALYSIS, ROUTINE W REFLEX MICROSCOPIC
GLUCOSE, UA: NEGATIVE mg/dL
Glucose, UA: NEGATIVE mg/dL
HGB URINE DIPSTICK: NEGATIVE
Hgb urine dipstick: NEGATIVE
Ketones, ur: NEGATIVE mg/dL
Ketones, ur: NEGATIVE mg/dL
LEUKOCYTES UA: NEGATIVE
Leukocytes, UA: NEGATIVE
NITRITE: NEGATIVE
NITRITE: NEGATIVE
PH: 5 (ref 5.0–8.0)
PH: 5 (ref 5.0–8.0)
PROTEIN: NEGATIVE mg/dL
Protein, ur: NEGATIVE mg/dL
SPECIFIC GRAVITY, URINE: 1.022 (ref 1.005–1.030)
Specific Gravity, Urine: 1.021 (ref 1.005–1.030)
Urobilinogen, UA: 0.2 mg/dL (ref 0.0–1.0)
Urobilinogen, UA: 0.2 mg/dL (ref 0.0–1.0)

## 2015-06-05 LAB — CBC WITH DIFFERENTIAL/PLATELET
BASOS ABS: 0 10*3/uL (ref 0.0–0.1)
Basophils Absolute: 0 10*3/uL (ref 0.0–0.1)
Basophils Relative: 0 %
Basophils Relative: 0 %
EOS ABS: 0.1 10*3/uL (ref 0.0–0.7)
EOS PCT: 1 %
Eosinophils Absolute: 0.2 10*3/uL (ref 0.0–0.7)
Eosinophils Relative: 1 %
HCT: 29.4 % — ABNORMAL LOW (ref 39.0–52.0)
HCT: 27.9 % — ABNORMAL LOW (ref 39.0–52.0)
Hemoglobin: 9.7 g/dL — ABNORMAL LOW (ref 13.0–17.0)
Hemoglobin: 8.9 g/dL — ABNORMAL LOW (ref 13.0–17.0)
Lymphocytes Relative: 6 %
Lymphocytes Relative: 8 %
Lymphs Abs: 1.1 10*3/uL (ref 0.7–4.0)
Lymphs Abs: 1.4 10*3/uL (ref 0.7–4.0)
MCH: 29.7 pg (ref 26.0–34.0)
MCH: 28.6 pg (ref 26.0–34.0)
MCHC: 33 g/dL (ref 30.0–36.0)
MCHC: 31.9 g/dL (ref 30.0–36.0)
MCV: 89.9 fL (ref 78.0–100.0)
MCV: 89.7 fL (ref 78.0–100.0)
Monocytes Absolute: 1.7 10*3/uL — ABNORMAL HIGH (ref 0.1–1.0)
Monocytes Absolute: 1.3 10*3/uL — ABNORMAL HIGH (ref 0.1–1.0)
Monocytes Relative: 10 %
Monocytes Relative: 8 %
Neutro Abs: 14.5 10*3/uL — ABNORMAL HIGH (ref 1.7–7.7)
Neutro Abs: 13.8 10*3/uL — ABNORMAL HIGH (ref 1.7–7.7)
Neutrophils Relative %: 83 %
Neutrophils Relative %: 83 %
PLATELETS: 274 10*3/uL (ref 150–400)
Platelets: 346 10*3/uL (ref 150–400)
RBC: 3.27 MIL/uL — ABNORMAL LOW (ref 4.22–5.81)
RBC: 3.11 MIL/uL — AB (ref 4.22–5.81)
RDW: 14.3 % (ref 11.5–15.5)
RDW: 14.4 % (ref 11.5–15.5)
WBC: 17.5 10*3/uL — ABNORMAL HIGH (ref 4.0–10.5)
WBC: 16.7 10*3/uL — AB (ref 4.0–10.5)

## 2015-06-05 LAB — BASIC METABOLIC PANEL
ANION GAP: 6 (ref 5–15)
ANION GAP: 8 (ref 5–15)
BUN: 40 mg/dL — ABNORMAL HIGH (ref 6–20)
BUN: 36 mg/dL — ABNORMAL HIGH (ref 6–20)
CALCIUM: 7.9 mg/dL — AB (ref 8.9–10.3)
CHLORIDE: 102 mmol/L (ref 101–111)
CHLORIDE: 101 mmol/L (ref 101–111)
CO2: 25 mmol/L (ref 22–32)
CO2: 23 mmol/L (ref 22–32)
CREATININE: 2.87 mg/dL — AB (ref 0.61–1.24)
CREATININE: 2.41 mg/dL — AB (ref 0.61–1.24)
Calcium: 8 mg/dL — ABNORMAL LOW (ref 8.9–10.3)
GFR calc non Af Amer: 21 mL/min — ABNORMAL LOW (ref >60)
GFR calc non Af Amer: 26 mL/min — ABNORMAL LOW (ref >60)
GFR, EST AFRICAN AMERICAN: 25 mL/min — AB (ref >60)
GFR, EST AFRICAN AMERICAN: 30 mL/min — AB (ref >60)
Glucose, Bld: 39 mg/dL — CL (ref 65–99)
Glucose, Bld: 110 mg/dL — ABNORMAL HIGH (ref 65–99)
Potassium: 5.1 mmol/L (ref 3.5–5.1)
Potassium: 5.4 mmol/L — ABNORMAL HIGH (ref 3.5–5.1)
SODIUM: 133 mmol/L — AB (ref 135–145)
SODIUM: 132 mmol/L — AB (ref 135–145)

## 2015-06-05 LAB — PROTIME-INR
INR: 1.13 (ref 0.00–1.49)
Prothrombin Time: 14.7 seconds (ref 11.6–15.2)

## 2015-06-05 LAB — COMPREHENSIVE METABOLIC PANEL
ALBUMIN: 3.8 g/dL (ref 3.5–5.0)
ALT: 16 U/L — ABNORMAL LOW (ref 17–63)
ANION GAP: 11 (ref 5–15)
AST: 26 U/L (ref 15–41)
Alkaline Phosphatase: 90 U/L (ref 38–126)
BUN: 42 mg/dL — ABNORMAL HIGH (ref 6–20)
CHLORIDE: 96 mmol/L — AB (ref 101–111)
CO2: 23 mmol/L (ref 22–32)
Calcium: 8.6 mg/dL — ABNORMAL LOW (ref 8.9–10.3)
Creatinine, Ser: 3.8 mg/dL — ABNORMAL HIGH (ref 0.61–1.24)
GFR calc Af Amer: 17 mL/min — ABNORMAL LOW (ref >60)
GFR calc non Af Amer: 15 mL/min — ABNORMAL LOW (ref >60)
GLUCOSE: 105 mg/dL — AB (ref 65–99)
POTASSIUM: 6.9 mmol/L — AB (ref 3.5–5.1)
Sodium: 130 mmol/L — ABNORMAL LOW (ref 135–145)
Total Bilirubin: 0.4 mg/dL (ref 0.3–1.2)
Total Protein: 6.7 g/dL (ref 6.5–8.1)

## 2015-06-05 LAB — CREATININE, URINE, RANDOM: Creatinine, Urine: 233.21 mg/dL

## 2015-06-05 LAB — APTT: aPTT: 31 seconds (ref 24–37)

## 2015-06-05 LAB — LACTIC ACID, PLASMA: LACTIC ACID, VENOUS: 0.6 mmol/L (ref 0.5–2.0)

## 2015-06-05 LAB — CBG MONITORING, ED: GLUCOSE-CAPILLARY: 59 mg/dL — AB (ref 65–99)

## 2015-06-05 LAB — PROCALCITONIN: Procalcitonin: 0.11 ng/mL

## 2015-06-05 LAB — SODIUM, URINE, RANDOM: Sodium, Ur: 40 mmol/L

## 2015-06-05 LAB — I-STAT CG4 LACTIC ACID, ED: Lactic Acid, Venous: 1.02 mmol/L (ref 0.5–2.0)

## 2015-06-05 MED ORDER — ALUM & MAG HYDROXIDE-SIMETH 200-200-20 MG/5ML PO SUSP: 30.0000 mL | Freq: Four times a day (QID) | ORAL | Status: DC | PRN

## 2015-06-05 MED ORDER — SODIUM CHLORIDE 0.9 % IJ SOLN
3.0000 mL | Freq: Two times a day (BID) | INTRAMUSCULAR | Status: DC
  Administered 2015-06-06 – 2015-06-12 (×12): 3 mL via INTRAVENOUS

## 2015-06-05 MED ORDER — DEXTROSE 5 % IV SOLN
1.0000 g | Freq: Once | INTRAVENOUS | Status: AC
  Administered 2015-06-05: 1 g via INTRAVENOUS
  Filled 2015-06-05: qty 10

## 2015-06-05 MED ORDER — ONDANSETRON HCL 4 MG/2ML IJ SOLN: 4.0000 mg | Freq: Four times a day (QID) | INTRAMUSCULAR | Status: DC | PRN

## 2015-06-05 MED ORDER — SODIUM CHLORIDE 0.9 % IV BOLUS (SEPSIS)
1000.0000 mL | Freq: Once | INTRAVENOUS | Status: AC
  Administered 2015-06-05: 1000 mL via INTRAVENOUS

## 2015-06-05 MED ORDER — ONDANSETRON HCL 4 MG PO TABS: 4.0000 mg | ORAL_TABLET | Freq: Four times a day (QID) | ORAL | Status: DC | PRN

## 2015-06-05 MED ORDER — SODIUM POLYSTYRENE SULFONATE 15 GM/60ML PO SUSP: 15.0000 g | ORAL | Status: DC | PRN

## 2015-06-05 MED ORDER — HEPARIN SODIUM (PORCINE) 5000 UNIT/ML IJ SOLN
5000.0000 [IU] | Freq: Three times a day (TID) | INTRAMUSCULAR | Status: DC
  Administered 2015-06-06 – 2015-06-12 (×20): 5000 [IU] via SUBCUTANEOUS
  Filled 2015-06-05 (×22): qty 1

## 2015-06-05 MED ORDER — IOHEXOL 300 MG/ML  SOLN
50.0000 mL | INTRAMUSCULAR | Status: DC
  Administered 2015-06-05: 50 mL via ORAL

## 2015-06-05 MED ORDER — ACETAMINOPHEN 325 MG PO TABS
650.0000 mg | ORAL_TABLET | Freq: Four times a day (QID) | ORAL | Status: DC | PRN
  Administered 2015-06-08: 650 mg via ORAL
  Filled 2015-06-05: qty 2

## 2015-06-05 MED ORDER — INSULIN ASPART 100 UNIT/ML ~~LOC~~ SOLN
5.0000 [IU] | Freq: Once | SUBCUTANEOUS | Status: DC
  Filled 2015-06-05: qty 1

## 2015-06-05 MED ORDER — ACETAMINOPHEN 650 MG RE SUPP
650.0000 mg | Freq: Four times a day (QID) | RECTAL | Status: DC | PRN
  Administered 2015-06-05: 650 mg via RECTAL
  Filled 2015-06-05: qty 1

## 2015-06-05 MED ORDER — PIPERACILLIN-TAZOBACTAM 3.375 G IVPB 30 MIN
3.3750 g | Freq: Once | INTRAVENOUS | Status: AC
  Administered 2015-06-05: 3.375 g via INTRAVENOUS
  Filled 2015-06-05: qty 50

## 2015-06-05 MED ORDER — SODIUM BICARBONATE 8.4 % IV SOLN
50.0000 meq | Freq: Once | INTRAVENOUS | Status: AC
  Administered 2015-06-05: 50 meq via INTRAVENOUS
  Filled 2015-06-05: qty 50

## 2015-06-05 MED ORDER — VANCOMYCIN HCL IN DEXTROSE 1-5 GM/200ML-% IV SOLN: 1000.0000 mg | INTRAVENOUS | Status: DC

## 2015-06-05 MED ORDER — SODIUM POLYSTYRENE SULFONATE 15 GM/60ML PO SUSP
15.0000 g | Freq: Once | ORAL | Status: AC
  Administered 2015-06-05: 15 g via ORAL
  Filled 2015-06-05: qty 60

## 2015-06-05 MED ORDER — DEXTROSE 50 % IV SOLN
50.0000 mL | Freq: Once | INTRAVENOUS | Status: AC
  Administered 2015-06-05: 50 mL via INTRAVENOUS
  Filled 2015-06-05: qty 50

## 2015-06-05 MED ORDER — MORPHINE SULFATE (PF) 2 MG/ML IV SOLN
2.0000 mg | INTRAVENOUS | Status: DC | PRN
  Administered 2015-06-06 – 2015-06-10 (×5): 2 mg via INTRAVENOUS
  Filled 2015-06-05 (×5): qty 1

## 2015-06-05 MED ORDER — INSULIN ASPART 100 UNIT/ML IV SOLN
5.0000 [IU] | Freq: Once | INTRAVENOUS | Status: AC
  Administered 2015-06-05: 5 [IU] via INTRAVENOUS
  Filled 2015-06-05: qty 0.05

## 2015-06-05 MED ORDER — SODIUM CHLORIDE 0.9 % IV SOLN
1.0000 g | Freq: Once | INTRAVENOUS | Status: AC
  Administered 2015-06-05: 1 g via INTRAVENOUS
  Filled 2015-06-05: qty 10

## 2015-06-05 MED ORDER — PIPERACILLIN-TAZOBACTAM 3.375 G IVPB
3.3750 g | Freq: Three times a day (TID) | INTRAVENOUS | Status: DC
  Administered 2015-06-06 – 2015-06-07 (×5): 3.375 g via INTRAVENOUS
  Filled 2015-06-05 (×5): qty 50

## 2015-06-05 MED ORDER — SODIUM CHLORIDE 0.9 % IV SOLN
INTRAVENOUS | Status: DC
  Administered 2015-06-06 – 2015-06-09 (×6): via INTRAVENOUS

## 2015-06-05 MED ORDER — SODIUM CHLORIDE 0.9 % IV SOLN
INTRAVENOUS | Status: DC
  Administered 2015-06-05: 17:00:00 via INTRAVENOUS

## 2015-06-05 MED ORDER — VANCOMYCIN HCL IN DEXTROSE 1-5 GM/200ML-% IV SOLN
1000.0000 mg | Freq: Once | INTRAVENOUS | Status: AC
  Administered 2015-06-05: 1000 mg via INTRAVENOUS
  Filled 2015-06-05: qty 200

## 2015-06-05 MED ORDER — DEXTROSE 5 % IV SOLN
500.0000 mg | Freq: Once | INTRAVENOUS | Status: AC
  Administered 2015-06-05: 500 mg via INTRAVENOUS
  Filled 2015-06-05: qty 500

## 2015-06-05 MED ORDER — DEXTROSE 50 % IV SOLN
25.0000 mL | Freq: Once | INTRAVENOUS | Status: AC
  Administered 2015-06-05: 25 mL via INTRAVENOUS
  Filled 2015-06-05: qty 50

## 2015-06-05 MED ORDER — OXYCODONE HCL 5 MG PO TABS
5.0000 mg | ORAL_TABLET | ORAL | Status: DC | PRN
  Administered 2015-06-06: 5 mg via ORAL
  Filled 2015-06-05: qty 1

## 2015-06-05 MED ORDER — IOHEXOL 300 MG/ML  SOLN: 50.0000 mL | Freq: Once | INTRAMUSCULAR | Status: DC | PRN

## 2015-06-05 NOTE — ED Notes (Signed)
Wife states that patient was treated for UTI and started Cipro on 9/7 - pt was then seen by urology on 9/13 and was told urine was clear and to stop taking Cipro.  Wife reports pt had onset of fever and altered mental status this morning.  Wife reports that pt took motrin 800mg  this AM.

## 2015-06-05 NOTE — ED Notes (Addendum)
Judeen Hammans patient's wife number is [phone]. She would like to be called when patient is called.

## 2015-06-05 NOTE — ED Notes (Addendum)
Pt able to tell me his name and birthday month. He reports to me that he is in a hospital in [place]. Pt sleepy during this RN's assessment and has to be repeatedly woken up.

## 2015-06-05 NOTE — ED Notes (Signed)
Spoke to [facility] on 2w she reports their beds are full. She is contacting [facility] about situation since we were  Notified bed was ready.

## 2015-06-05 NOTE — Progress Notes (Signed)
ANTIBIOTIC CONSULT NOTE - INITIAL  Pharmacy Consult for Vancomycin & Zosyn Indication: Sepsis  No Known Allergies  Patient Measurements: Weight: 185 lb (83.915 kg)  Vital Signs: Temp: 101.4 F (38.6 C) (09/20 2000) Temp Source: Core (Comment) (09/20 1920) BP: 136/66 mmHg (09/20 1930) Pulse Rate: 110 (09/20 2000) Intake/Output from previous day:   Intake/Output from this shift:    Labs:  Recent Labs  06/05/15 1323 06/05/15 1806  WBC 17.5*  --   HGB 9.7*  --   PLT 346  --   CREATININE 3.80* 2.87*   Estimated Creatinine Clearance: 25 mL/min (by C-G formula based on Cr of 2.87). No results for input(s): VANCOTROUGH, VANCOPEAK, VANCORANDOM, GENTTROUGH, GENTPEAK, GENTRANDOM, TOBRATROUGH, TOBRAPEAK, TOBRARND, AMIKACINPEAK, AMIKACINTROU, AMIKACIN in the last 72 hours.   Microbiology: Recent Results (from the past 720 hour(s))  Culture, blood (routine x 2)     Status: None (Preliminary result)   Collection Time: 06/05/15 12:50 PM  Result Value Ref Range Status   Specimen Description   Final    BLOOD LEFT FOREARM Performed at [facility]    Special Requests BOTTLES DRAWN AEROBIC AND ANAEROBIC [facility]  Final   Culture PENDING  Incomplete   Report Status PENDING  Incomplete    Medical History: Past Medical History  Diagnosis Date  . Hypertension   . Anxiety     takes Celexa  . Enlarged prostate   . Neuropathy   . Arthritis   . History of blood transfusion     Medications:  Scheduled:   Infusions:  . sodium chloride 150 mL/hr at 06/05/15 1720  . piperacillin-tazobactam    . vancomycin     Assessment:  69 yr male with altered mental status.  Recently on ciprofloxacin for prostatitis.  Received Azithromycin & Ceftriaxone x 1 in ED  Patient to be admitted with sepsis with likely urinary source.  Pharmacy consulted to dose Vancomycin & Zosyn for sepsis.  CrCl ~ 25 ml/min  Blood and urine cultures ordered  Goal of Therapy:  Vancomycin trough  level 15-20 mcg/ml  Plan:  Measure antibiotic drug levels at steady state Follow up culture results  Vancomycin 1gm IV q24h Zosyn 3.375gm IV q8h Follow renal function and adjust dosages as needed  Everette Rank, PharmD 06/05/2015,8:02 PM

## 2015-06-05 NOTE — ED Notes (Addendum)
Per ems pt is from home, ems called for possible stroke, when ems arrived pt febrile, confused, dizzy, denied pain. Wife said pt was taken on 9/11 Sunday to [facility], in and out cath, was told urine was fine, followed up with pcp on Tuesday 9/13, everything was fine. On 9/8 pt was prescribed Cipro by Dr Roderic Palau, pt told ems "I had an infection", wife did not know about abx and abx half full, so pt did not complete abx.  Increased confusion, smoking an e cigarette that is not actually present, talking out of head, febrile, repetitively asking same questions.  For ems temp 102.4 oral, 18 G R AC, 512ml NS infused. Positive orthostatic vital signs 112/50 to 90/42  Pt takes 20mg  morphine BID, took dose around 0400. Wife said last time pt forgot to take meds he acted like this and was withdrawing. Pt febrile at present

## 2015-06-05 NOTE — ED Notes (Signed)
Pt AD  Erin on 2W she will call me back in  Regards to bed availability.  Will continue to monitor.

## 2015-06-05 NOTE — H&P (Signed)
[facility] History and Physical  William Jordan NAT:[id number] DOB: 19-Nov-1945 DOA: 06/05/2015  Referring physician:  PCP: [name], MD   Chief Complaint: Altered mental status  HPI: William Jordan is a 69 y.o. male with a past medical history chronic pain syndrome on narcotic analgesics, hypertension, neuropathy Who was brought to the emergency department for mental status changes. On 05/24/2015 he was seen in the emergency department where he was given a prescription for ciprofloxacin for prostatitis. Then his wife reported that he was taken to [facility] on 05/27/2015 for urinary retention where he underwent in and out catheterization. He followed up with urology on 05/29/2015 where he was instructed to discontinue ciprofloxacin. Patient having a significant decline  this morning as he developed significant mental status changes characterized by acute confusion along with significant weakness. His wife called EMS  where he was found to have a temperature of 102.4 on the field. In the emergency department he had a temperature of 101.5,   with labs showing the development of acute kidney injury with creatinine of 3.8 and BUN of 42. His potassium was also elevated at 6.9 and had a white count of 17,500. Patient having abdominal discomfort on physical exam for which emergency room provider ordered CT scan of abdomen and pelvis. Results are pending at the time of this dictation. Family members were not present, patient could not provide history. Above history was obtained from emergency room staff and medical records.                                                 Review of Systems:  I could not obtain a reliable review of systems given acute toxic metabolic encephalopathy   Past Medical History  Diagnosis Date  . Hypertension   . Anxiety     takes Celexa  . Enlarged prostate   . Neuropathy   . Arthritis   . History of blood transfusion    Past Surgical History  Procedure Laterality  Date  . Joint replacement Left     knee, x 2  . Joint replacement Right     hip  . Joint replacement Left     partial shoulder  . Elbow surgery Right   . Hernia repair      umbilical  . Tonsillectomy    . Vasectomy    . Colonoscopy      one polyp removed - benign  . Spinal cord stimulator insertion N/A 09/28/2014    Procedure: LUMBAR SPINAL CORD STIMULATOR INSERTION;  Surgeon: Melina Schools, MD;  Location: [place];  Service: Orthopedics;  Laterality: N/A;   Social History:  reports that he has been smoking E-cigarettes.  He has quit using smokeless tobacco. His smokeless tobacco use included Snuff. He reports that he does not drink alcohol or use illicit drugs.  No Known Allergies  Family History  Problem Relation Age of Onset  . Heart disease Father      Prior to Admission medications   Medication Sig Start Date End Date Taking? Authorizing Provider  amLODipine (NORVASC) 5 MG tablet Take 5 mg by mouth daily.   Yes Historical Provider, MD  benazepril (LOTENSIN) 20 MG tablet Take 20 mg by mouth daily.   Yes Historical Provider, MD  citalopram (CELEXA) 10 MG tablet Take 20 mg by mouth daily.   Yes Historical Provider,  MD  docusate sodium (COLACE) 100 MG capsule Take 1 capsule (100 mg total) by mouth 3 (three) times daily as needed for mild constipation. 09/28/14  Yes Melina Schools, MD  gabapentin (NEURONTIN) 600 MG tablet Take 600-1,200 mg by mouth 4 (four) times daily -  before meals and at bedtime. 600mg  with meals and 1200mg  at bedtime   Yes Historical Provider, MD  morphine (MS CONTIN) 60 MG 12 hr tablet Take 60 mg by mouth every 8 (eight) hours.   Yes Historical Provider, MD  psyllium (METAMUCIL) 58.6 % packet Take 1 packet by mouth 2 (two) times daily.   Yes Historical Provider, MD  ciprofloxacin (CIPRO) 500 MG tablet Take 1 tablet (500 mg total) by mouth 2 (two) times daily. Patient not taking: Reported on 06/05/2015 05/24/15   Milton Ferguson, MD  methocarbamol (ROBAXIN) 500 MG  tablet Take 1 tablet (500 mg total) by mouth 3 (three) times daily as needed for muscle spasms. Patient not taking: Reported on 06/05/2015 09/28/14   Melina Schools, MD  ondansetron (ZOFRAN ODT) 4 MG disintegrating tablet 4mg  ODT q4 hours prn nausea/vomit Patient not taking: Reported on 06/05/2015 05/24/15   Milton Ferguson, MD  ondansetron (ZOFRAN) 4 MG tablet Take 1 tablet (4 mg total) by mouth every 8 (eight) hours as needed for nausea or vomiting. Patient not taking: Reported on 06/05/2015 09/28/14   Melina Schools, MD  oxyCODONE-acetaminophen (PERCOCET) 10-325 MG per tablet Take 1 tablet by mouth every 4 (four) hours as needed for pain. Patient not taking: Reported on 06/05/2015 09/29/14   Melina Schools, MD  polyethylene glycol powder (GLYCOLAX) powder Take 17 g by mouth daily. Patient not taking: Reported on 06/05/2015 09/28/14   Melina Schools, MD   Physical Exam: Filed Vitals:   06/05/15 1315 06/05/15 1330 06/05/15 1345 06/05/15 1600  BP: 116/92 120/91 129/97 101/64  Pulse: 103 104 101 107  Temp:    101.5 F (38.6 C)  TempSrc:    Core (Comment)  Resp: 16 21 16 22   Weight:      SpO2: 88% 96% 99% 93%    Wt Readings from Last 3 Encounters:  06/05/15 83.915 kg (185 lb)  09/28/14 83.915 kg (185 lb)    General:  patient is toxic-appearing, confused, disoriented, and able to provide history. He is unable to follow commands or speak coherently. Eyes: PERRL, normal lids, irises & conjunctiva, no scleral icterus ENT: grossly normal hearing, lips & tongue, dry oral mucosa Neck: no LAD, masses or thyromegaly Cardiovascular: RRR, no m/r/g. No LE edema. Telemetry: SR, no arrhythmias  Respiratory: CTA bilaterally, no w/r/r. Normal respiratory effort. Abdomen He appears to have tenderness to palpation across generalized abdomen, positive bowel sounds, no guarding or rebound tenderness  Skin: no rash or induration seen on limited exam Musculoskeletal: grossly normal tone BUE/BLE Psychiatric: patient is  encephalopathic, unable to speak coherently or provide history.  Neurologic: I could not obtain a reliable neurologic examination because of toxic encephalopathy. He cannot follow commands or participate in neurologic exam.           Labs on Admission:  Basic Metabolic Panel:  Recent Labs Lab 06/05/15 1323  NA 130*  K 6.9*  CL 96*  CO2 23  GLUCOSE 105*  BUN 42*  CREATININE 3.80*  CALCIUM 8.6*   Liver Function Tests:  Recent Labs Lab 06/05/15 1323  AST 26  ALT 16*  ALKPHOS 90  BILITOT 0.4  PROT 6.7  ALBUMIN 3.8   No results for input(s): LIPASE,  AMYLASE in the last 168 hours. No results for input(s): AMMONIA in the last 168 hours. CBC:  Recent Labs Lab 06/05/15 1323  WBC 17.5*  NEUTROABS 14.5*  HGB 9.7*  HCT 29.4*  MCV 89.9  PLT 346   Cardiac Enzymes: No results for input(s): CKTOTAL, CKMB, CKMBINDEX, TROPONINI in the last 168 hours.  BNP (last 3 results) No results for input(s): BNP in the last 8760 hours.  ProBNP (last 3 results) No results for input(s): PROBNP in the last 8760 hours.  CBG: No results for input(s): GLUCAP in the last 168 hours.  Radiological Exams on Admission: Dg Chest Port 1 View  06/05/2015   CLINICAL DATA:  Sepsis  EXAM: PORTABLE CHEST - 1 VIEW  COMPARISON:  05/24/2015  FINDINGS: There is vascular congestion. Low lung volumes with bibasilar atelectasis. No effusions. Heart is upper limits normal in size. No acute bony abnormality.  IMPRESSION: Vascular congestion with low volumes and bibasilar atelectasis.   Electronically Signed   By: Rolm Baptise M.D.   On: 06/05/2015 15:18    EKG: Independently reviewed.   Assessment/Plan Principal Problem:   Sepsis Active Problems:   Toxic metabolic encephalopathy   AKI (acute kidney injury)   Polypharmacy   Hyperkalemia   1. Sepsis. Present on admission, evidenced by temperature of 101.4, heart rate of 103,  white count of 17,500, department of acute kidney injury with creatinine of  3.8 and BUN of 42, presenting with toxic metabolic encephalopathy. I suspect that the source of infection may be from urinary tract infection. This may have resulted from urinary retention which prompted a recent emergency room visit. In the ER after placement of a Foley catheter 500 ML's of urine were drained. Interestingly urinalysis performed in the emergency room did not show the presence of leukocyte esterase or nitrates. I think would be prudent to repeat this. Because of abdominal pain emergency room provider has ordered a CT scan of abdomen and pelvis. This may be helpful to assess for hydronephrosis or perhaps for acute intra-abdominal process. will treat with broad-spectrum empiric IV antimicrobial therapy with vancomycin and Zosyn.  2. Acute kidney injury. Patient presenting with a creatinine of 3.8 with BUN of 42, increased from 0.78 and BUN respectively on 05/24/2015. Likely secondary to combination of critical illness, profound dehydration along with ACE inhibitor use. Will provide aggressive IV fluid resuscitation, monitor ins and outs, and discontinue his benazepril. I'm also suspicious that post obstructive nephropathy is a possibility given prior issues with urinary retention. Awaiting CT scan of abdomen and pelvis at the time of this dictation.   3. Hyperkalemia. Labs showing potassium of 6.9. Case discussed with emergency room provider, will administer calcium gluconate, insulin plus D50, Kayexalate. Will repeat BMP in several hours. 4.  Delirium.  Patient presenting with acute mental status changes likely secondary to an underlying infectious process. He presents with sepsis criteria. Admitting to the step down unit. 5.  Urinary retention. I suspect this may precipitated acute pyelonephritis, status post Foley catheter placement in the emergency room with 500 mL's of urine drained. Providing empiric IV antibiotic therapy with vancomycin and Zosyn. Will follow-up on CT scan of abdomen and  pelvis.  6. History of hypertension. He presents with sepsis criteria, will hold antihypertensives agents on admission particularly ACE inhibitor therapy. He presents with acute renal failure and hyperkalemia. Blood pressures holding in the emergency department with IV fluid resuscitation.    7.  DVT prophylaxis. Subcutaneous heparin   Code Status: full code  Family Communication: family not present Disposition Plan: will admit patient to the step down unit, dyspnea require greater than 2 night hospitalization   Time spent: 70 min  Kelvin Cellar [facility] Pager [phone]

## 2015-06-05 NOTE — Consult Note (Signed)
Renal Service Consult Note [facility]  William Jordan 06/05/2015 William Jordan Requesting Physician:  Dr Coralyn Pear  Reason for Consult:  Acute renal failure, high K+ HPI: The patient is a 69 y.o. year-old with hx of HTN, anxiety, enlarged prostate, neuropathy and DJD (left knee TKR x 2, right THR, partial shoulder replacement on left).  Patient was brought to ED from home for possible stroke. On arrival by EMS pt was confused, febrile, dizzy and denied pain.  Wife noted on 9.11 pt was taken to [facility] for urinary retention, I/O cath and was told urine was "fine". He followed up on 9/13 and was doing well.  On 9/ 8 pt was prescribed Cipro, he says he " had an infection". He did not complete abx , has taken about half to date. Per ED notes the abx were stopped by the urologist after urine results were back.  Per wife pt has been very confused , smoking e-cigarette that is not there, repeating same questions over and over, febrile at home. Temp in ED 10.2.4 deg F. Pt takes po morphine 20 bid.  Wife said he acts like this when he is withdrawing.    Labs in ED showed K 6.9, EKG normal w/o QRS widening, and creat 3.80 w BUN 42.  CO2 23, Na 130.     Home meds are > norvasc, lotensin (benazepril), Celexa, colace, neurontin, MS contin, metamucil, cipro, Robaxin, zofran, Percocet, Glycolax powder  Creat two weeks ago on 05/24/15 was 0.78.   Patient very confused, not providing much helpful history.   Past Medical History  Past Medical History  Diagnosis Date  . Hypertension   . Anxiety     takes Celexa  . Enlarged prostate   . Neuropathy   . Arthritis   . History of blood transfusion    Past Surgical History  Past Surgical History  Procedure Laterality Date  . Joint replacement Left     knee, x 2  . Joint replacement Right     hip  . Joint replacement Left     partial shoulder  . Elbow surgery Right   . Hernia repair      umbilical  . Tonsillectomy    . Vasectomy     . Colonoscopy      one polyp removed - benign  . Spinal cord stimulator insertion N/A 09/28/2014    Procedure: LUMBAR SPINAL CORD STIMULATOR INSERTION;  Surgeon: Melina Schools, MD;  Location: [place];  Service: Orthopedics;  Laterality: N/A;   Family History  Family History  Problem Relation Age of Onset  . Heart disease Father    Social History  reports that he has been smoking E-cigarettes.  He has quit using smokeless tobacco. His smokeless tobacco use included Snuff. He reports that he does not drink alcohol or use illicit drugs. Allergies No Known Allergies Home medications Prior to Admission medications   Medication Sig Start Date End Date Taking? Authorizing Provider  amLODipine (NORVASC) 5 MG tablet Take 5 mg by mouth daily.   Yes Historical Provider, MD  benazepril (LOTENSIN) 20 MG tablet Take 20 mg by mouth daily.   Yes Historical Provider, MD  citalopram (CELEXA) 10 MG tablet Take 20 mg by mouth daily.   Yes Historical Provider, MD  docusate sodium (COLACE) 100 MG capsule Take 1 capsule (100 mg total) by mouth 3 (three) times daily as needed for mild constipation. 09/28/14  Yes Melina Schools, MD  gabapentin (NEURONTIN) 600 MG tablet Take  600-1,200 mg by mouth 4 (four) times daily -  before meals and at bedtime. 600mg  with meals and 1200mg  at bedtime   Yes Historical Provider, MD  morphine (MS CONTIN) 60 MG 12 hr tablet Take 60 mg by mouth every 8 (eight) hours.   Yes Historical Provider, MD  psyllium (METAMUCIL) 58.6 % packet Take 1 packet by mouth 2 (two) times daily.   Yes Historical Provider, MD  ciprofloxacin (CIPRO) 500 MG tablet Take 1 tablet (500 mg total) by mouth 2 (two) times daily. Patient not taking: Reported on 06/05/2015 05/24/15   Milton Ferguson, MD  methocarbamol (ROBAXIN) 500 MG tablet Take 1 tablet (500 mg total) by mouth 3 (three) times daily as needed for muscle spasms. Patient not taking: Reported on 06/05/2015 09/28/14   Melina Schools, MD  ondansetron (ZOFRAN ODT) 4  MG disintegrating tablet 4mg  ODT q4 hours prn nausea/vomit Patient not taking: Reported on 06/05/2015 05/24/15   Milton Ferguson, MD  ondansetron (ZOFRAN) 4 MG tablet Take 1 tablet (4 mg total) by mouth every 8 (eight) hours as needed for nausea or vomiting. Patient not taking: Reported on 06/05/2015 09/28/14   Melina Schools, MD  oxyCODONE-acetaminophen (PERCOCET) 10-325 MG per tablet Take 1 tablet by mouth every 4 (four) hours as needed for pain. Patient not taking: Reported on 06/05/2015 09/29/14   Melina Schools, MD  polyethylene glycol powder (GLYCOLAX) powder Take 17 g by mouth daily. Patient not taking: Reported on 06/05/2015 09/28/14   Melina Schools, MD   Liver Function Tests  Recent Labs Lab 06/05/15 1323  AST 26  ALT 16*  ALKPHOS 90  BILITOT 0.4  PROT 6.7  ALBUMIN 3.8   No results for input(s): LIPASE, AMYLASE in the last 168 hours. CBC  Recent Labs Lab 06/05/15 1323  WBC 17.5*  NEUTROABS 14.5*  HGB 9.7*  HCT 29.4*  MCV 89.9  PLT 536   Basic Metabolic Panel  Recent Labs Lab 06/05/15 1323  NA 130*  K 6.9*  CL 96*  CO2 23  GLUCOSE 105*  BUN 42*  CREATININE 3.80*  CALCIUM 8.6*    Filed Vitals:   06/05/15 1315 06/05/15 1330 06/05/15 1345 06/05/15 1600  BP: 116/92 120/91 129/97 101/64  Pulse: 103 104 101 107  Temp:    101.5 F (38.6 C)  TempSrc:    Core (Comment)  Resp: 16 21 16 22   Weight:      SpO2: 88% 96% 99% 93%   Exam Confused , a bit agitated, grasping for railings of stretcher No rash, cyanosis or gangrene Sclera anicteric, throat quite dry No jvd Chest is clear bilat RRR no MRG Abd is soft ntnd no mass or ascites GU normal male, foley in place draining large amount dark amber urine, clear No joint effusions No rash, no ulcers or lesions No LE edema or UE edema Neuro is alert, nf, Ox 1, confused, tremulous and possible myoclonus as well  UA > amber, 5.0, 1.022, no rbc/ wbc's CXR > low lung volumes, bibasilar atx, ?vasc  congestion  Assessment: 1. Acute renal failure - UA benign, +ACEi and vol depletion on exam. Suspect AKI functional due to vol depletion/ hypotension/ ACEi effect.  Recommend renal US, IVF"s,urine lytes,  f/u creat in am.   2. Hyperkalemia - no EKG changes, use low K renal diet when able to eat, and if pt can swallow tonight would give Kayexalate po, see orders.  If cannot swallow would just follow K. Use telemetry.  3. AMS - suspect  neurontin toxicity as he is on high doses and with AKI they will not be excreted. Also consider narcotic excess or withdrawal. Could have infection underlying [facility]). Doubt uremia with creat mid 3's.  4. HTN on norvasc/ acei at home, would hold for now 5. BPH - check [place] for hydro 6.  7. Chronic pain 8. DJD w hx multiple joint replacement surgeries    Plan- as above  Kelly Splinter MD (pgr) [phone]    ([phone] 06/05/2015, 4:18 PM

## 2015-06-05 NOTE — ED Notes (Signed)
Bed: WA03 Expected date:  Expected time:  Means of arrival:  Comments: Ems- possible sepsis

## 2015-06-05 NOTE — ED Notes (Signed)
Critical K+ = 6.9. Dr. Doy Mince notified.

## 2015-06-05 NOTE — ED Notes (Signed)
Report given to Erin RN

## 2015-06-05 NOTE — ED Provider Notes (Signed)
CSN: [id number]     Arrival date & time 06/05/15  1229 History   First MD Initiated Contact with Patient 06/05/15 1316     Chief Complaint  Patient presents with  . Altered Mental Status     (Consider location/radiation/quality/duration/timing/severity/associated sxs/prior Treatment) Patient is a 69 y.o. male presenting with altered mental status.  Altered Mental Status Presenting symptoms: confusion   Severity:  Severe Most recent episode:  Today Episode history:  Single Timing:  Constant Progression:  Unchanged Chronicity:  New Context comment:  Seen in ED about a week ago and st arted on Cipro for prostatitis.  Seen by Urology subsequently and it was discontinued.  He did well for about a week until today when he was confused Associated symptoms: abdominal pain, fever (today) and weakness   Associated symptoms: no nausea     Past Medical History  Diagnosis Date  . Hypertension   . Anxiety     takes Celexa  . Enlarged prostate   . Neuropathy   . Arthritis   . History of blood transfusion    Past Surgical History  Procedure Laterality Date  . Joint replacement Left     knee, x 2  . Joint replacement Right     hip  . Joint replacement Left     partial shoulder  . Elbow surgery Right   . Hernia repair      umbilical  . Tonsillectomy    . Vasectomy    . Colonoscopy      one polyp removed - benign  . Spinal cord stimulator insertion N/A 09/28/2014    Procedure: LUMBAR SPINAL CORD STIMULATOR INSERTION;  Surgeon: Melina Schools, MD;  Location: [place];  Service: Orthopedics;  Laterality: N/A;   Family History  Problem Relation Age of Onset  . Heart disease Father    Social History  Substance Use Topics  . Smoking status: Current Every Day Smoker    Types: E-cigarettes  . Smokeless tobacco: Former Systems developer    Types: Snuff     Comment: does not use any tobacco products any more  . Alcohol Use: No    Review of Systems  Constitutional: Positive for fever (today).   Gastrointestinal: Positive for abdominal pain. Negative for nausea.  Neurological: Positive for weakness.  Psychiatric/Behavioral: Positive for confusion.  All other systems reviewed and are negative.     Allergies  Review of patient's allergies indicates no known allergies.  Home Medications   Prior to Admission medications   Medication Sig Start Date End Date Taking? Authorizing Provider  amLODipine (NORVASC) 5 MG tablet Take 5 mg by mouth daily.   Yes Historical Provider, MD  benazepril (LOTENSIN) 20 MG tablet Take 20 mg by mouth daily.   Yes Historical Provider, MD  citalopram (CELEXA) 10 MG tablet Take 20 mg by mouth daily.   Yes Historical Provider, MD  docusate sodium (COLACE) 100 MG capsule Take 1 capsule (100 mg total) by mouth 3 (three) times daily as needed for mild constipation. 09/28/14  Yes Melina Schools, MD  gabapentin (NEURONTIN) 600 MG tablet Take 600-1,200 mg by mouth 4 (four) times daily -  before meals and at bedtime. 600mg  with meals and 1200mg  at bedtime   Yes Historical Provider, MD  morphine (MS CONTIN) 60 MG 12 hr tablet Take 60 mg by mouth every 8 (eight) hours.   Yes Historical Provider, MD  psyllium (METAMUCIL) 58.6 % packet Take 1 packet by mouth 2 (two) times daily.   Yes Historical  Provider, MD  ciprofloxacin (CIPRO) 500 MG tablet Take 1 tablet (500 mg total) by mouth 2 (two) times daily. Patient not taking: Reported on 06/05/2015 05/24/15   Milton Ferguson, MD  methocarbamol (ROBAXIN) 500 MG tablet Take 1 tablet (500 mg total) by mouth 3 (three) times daily as needed for muscle spasms. Patient not taking: Reported on 06/05/2015 09/28/14   Melina Schools, MD  ondansetron (ZOFRAN ODT) 4 MG disintegrating tablet 4mg  ODT q4 hours prn nausea/vomit Patient not taking: Reported on 06/05/2015 05/24/15   Milton Ferguson, MD  ondansetron (ZOFRAN) 4 MG tablet Take 1 tablet (4 mg total) by mouth every 8 (eight) hours as needed for nausea or vomiting. Patient not taking: Reported  on 06/05/2015 09/28/14   Melina Schools, MD  oxyCODONE-acetaminophen (PERCOCET) 10-325 MG per tablet Take 1 tablet by mouth every 4 (four) hours as needed for pain. Patient not taking: Reported on 06/05/2015 09/29/14   Melina Schools, MD  polyethylene glycol powder (GLYCOLAX) powder Take 17 g by mouth daily. Patient not taking: Reported on 06/05/2015 09/28/14   Melina Schools, MD   BP 129/97 mmHg  Pulse 101  Temp(Src) 101.4 F (38.6 C) (Rectal)  Resp 16  Wt 185 lb (83.915 kg)  SpO2 99% Physical Exam  Constitutional: He is oriented to person, place, and time. He appears well-developed and well-nourished. No distress.  HENT:  Head: Normocephalic and atraumatic.  Mouth/Throat: Mucous membranes are dry.  Eyes: Conjunctivae are normal. Pupils are equal, round, and reactive to light. No scleral icterus.  Neck: Neck supple.  Cardiovascular: Normal rate, regular rhythm, normal heart sounds and intact distal pulses.   No murmur heard. Pulmonary/Chest: Effort normal and breath sounds normal. No stridor. No respiratory distress. He has no wheezes. He has no rales.  Abdominal: Soft. He exhibits no distension. There is tenderness in the suprapubic area. There is no rigidity, no rebound, no guarding and no CVA tenderness.  Musculoskeletal: Normal range of motion. He exhibits no edema.  Neurological: He is alert and oriented to person, place, and time. GCS eye subscore is 4. GCS verbal subscore is 5. GCS motor subscore is 6.  Occasional myoclonus  Skin: Skin is warm and dry. No rash noted.  Psychiatric: He has a normal mood and affect. His behavior is normal.  Nursing note and vitals reviewed.   ED Course  CRITICAL CARE Performed by: Serita Grit Authorized by: Serita Grit Total critical care time: 40 minutes Critical care time was exclusive of separately billable procedures and treating other patients. Critical care was necessary to treat or prevent imminent or life-threatening deterioration of the  following conditions: renal failure and sepsis. Critical care was time spent personally by me on the following activities: development of treatment plan with patient or surrogate, discussions with consultants, evaluation of patient's response to treatment, examination of patient, obtaining history from patient or surrogate, ordering and performing treatments and interventions, ordering and review of laboratory studies, ordering and review of radiographic studies, pulse oximetry, re-evaluation of patient's condition and review of old charts.   (including critical care time) Labs Review Labs Reviewed  COMPREHENSIVE METABOLIC PANEL - Abnormal; Notable for the following:    Sodium 130 (*)    Potassium 6.9 (*)    Chloride 96 (*)    Glucose, Bld 105 (*)    BUN 42 (*)    Creatinine, Ser 3.80 (*)    Calcium 8.6 (*)    ALT 16 (*)    GFR calc non Af Amer 15 (*)  GFR calc Af Amer 17 (*)    All other components within normal limits  CBC WITH DIFFERENTIAL/PLATELET - Abnormal; Notable for the following:    WBC 17.5 (*)    RBC 3.27 (*)    Hemoglobin 9.7 (*)    HCT 29.4 (*)    Neutro Abs 14.5 (*)    Monocytes Absolute 1.7 (*)    All other components within normal limits  URINALYSIS, ROUTINE W REFLEX MICROSCOPIC (NOT AT [facility]) - Abnormal; Notable for the following:    Color, Urine AMBER (*)    APPearance CLOUDY (*)    Bilirubin Urine SMALL (*)    All other components within normal limits  CULTURE, BLOOD (ROUTINE X 2)  CULTURE, BLOOD (ROUTINE X 2)  URINE CULTURE  URINALYSIS, ROUTINE W REFLEX MICROSCOPIC (NOT AT [facility])  I-STAT CG4 LACTIC ACID, ED  I-STAT CG4 LACTIC ACID, ED    Imaging Review Dg Chest Port 1 View  06/05/2015   CLINICAL DATA:  Sepsis  EXAM: PORTABLE CHEST - 1 VIEW  COMPARISON:  05/24/2015  FINDINGS: There is vascular congestion. Low lung volumes with bibasilar atelectasis. No effusions. Heart is upper limits normal in size. No acute bony abnormality.  IMPRESSION: Vascular  congestion with low volumes and bibasilar atelectasis.   Electronically Signed   By: Rolm Baptise M.D.   On: 06/05/2015 15:18   I have personally reviewed and evaluated these images and lab results as part of my medical decision-making.   EKG Interpretation   Date/Time:  Tuesday June 05 2015 14:27:11 EDT Ventricular Rate:  103 PR Interval:  185 QRS Duration: 82 QT Interval:  328 QTC Calculation: 429 R Axis:   89 Text Interpretation:  Sinus tachycardia Borderline right axis deviation  Borderline low voltage, extremity leads rate increased compared to prior.  Reconfirmed by [facility]  MD, TREY [phone]) on 06/06/2015 4:17:20 PM      MDM   Final diagnoses:  Sepsis  acute renal failure Hyperkalemia   69 yo male presenting with fever and altered mental status.  Very dry on exam.  Has acute renal failure on labwork with hyperkalemia.  No peaked t waves on ekg.  Therefore, required treatment with aggressive IV fluids and close monitoring of telemetry to monitor for worsening/cardiac arryhthmias.  Will require admission to stepdown unit.      Serita Grit, MD 06/06/15 [phone]

## 2015-06-06 ENCOUNTER — Inpatient Hospital Stay (HOSPITAL_COMMUNITY): Payer: Medicare Other

## 2015-06-06 DIAGNOSIS — K5900 Constipation, unspecified: Secondary | ICD-10-CM

## 2015-06-06 DIAGNOSIS — N179 Acute kidney failure, unspecified: Secondary | ICD-10-CM | POA: Insufficient documentation

## 2015-06-06 LAB — COMPREHENSIVE METABOLIC PANEL
ALBUMIN: 3.1 g/dL — AB (ref 3.5–5.0)
ALK PHOS: 72 U/L (ref 38–126)
ALT: 16 U/L — ABNORMAL LOW (ref 17–63)
ANION GAP: 4 — AB (ref 5–15)
AST: 23 U/L (ref 15–41)
BILIRUBIN TOTAL: 0.9 mg/dL (ref 0.3–1.2)
BUN: 28 mg/dL — ABNORMAL HIGH (ref 6–20)
CALCIUM: 7.6 mg/dL — AB (ref 8.9–10.3)
CO2: 26 mmol/L (ref 22–32)
Chloride: 100 mmol/L — ABNORMAL LOW (ref 101–111)
Creatinine, Ser: 1.33 mg/dL — ABNORMAL HIGH (ref 0.61–1.24)
GFR calc non Af Amer: 54 mL/min — ABNORMAL LOW (ref >60)
GLUCOSE: 104 mg/dL — AB (ref 65–99)
POTASSIUM: 4.7 mmol/L (ref 3.5–5.1)
SODIUM: 130 mmol/L — AB (ref 135–145)
TOTAL PROTEIN: 5.9 g/dL — AB (ref 6.5–8.1)

## 2015-06-06 LAB — URINE CULTURE: CULTURE: NO GROWTH

## 2015-06-06 LAB — URINALYSIS, ROUTINE W REFLEX MICROSCOPIC
BILIRUBIN URINE: NEGATIVE
Glucose, UA: NEGATIVE mg/dL
KETONES UR: NEGATIVE mg/dL
NITRITE: NEGATIVE
PH: 5.5 (ref 5.0–8.0)
Protein, ur: NEGATIVE mg/dL
Specific Gravity, Urine: 1.016 (ref 1.005–1.030)
UROBILINOGEN UA: 0.2 mg/dL (ref 0.0–1.0)

## 2015-06-06 LAB — CBC
HCT: 26 % — ABNORMAL LOW (ref 39.0–52.0)
Hemoglobin: 8.5 g/dL — ABNORMAL LOW (ref 13.0–17.0)
MCH: 29.7 pg (ref 26.0–34.0)
MCHC: 32.7 g/dL (ref 30.0–36.0)
MCV: 90.9 fL (ref 78.0–100.0)
PLATELETS: 310 10*3/uL (ref 150–400)
RBC: 2.86 MIL/uL — ABNORMAL LOW (ref 4.22–5.81)
RDW: 14.6 % (ref 11.5–15.5)
WBC: 16.4 10*3/uL — AB (ref 4.0–10.5)

## 2015-06-06 LAB — LACTIC ACID, PLASMA: Lactic Acid, Venous: 0.9 mmol/L (ref 0.5–2.0)

## 2015-06-06 LAB — URINE MICROSCOPIC-ADD ON

## 2015-06-06 LAB — MRSA PCR SCREENING: MRSA by PCR: NEGATIVE

## 2015-06-06 MED ORDER — LORAZEPAM 2 MG/ML IJ SOLN
1.0000 mg | Freq: Once | INTRAMUSCULAR | Status: AC
  Administered 2015-06-06: 1 mg via INTRAVENOUS
  Filled 2015-06-06: qty 1

## 2015-06-06 MED ORDER — HALOPERIDOL LACTATE 5 MG/ML IJ SOLN
INTRAMUSCULAR | Status: AC
  Filled 2015-06-06: qty 1

## 2015-06-06 MED ORDER — HALOPERIDOL LACTATE 5 MG/ML IJ SOLN
5.0000 mg | Freq: Once | INTRAMUSCULAR | Status: AC
  Administered 2015-06-06: 5 mg via INTRAVENOUS

## 2015-06-06 MED ORDER — MORPHINE SULFATE ER 30 MG PO TBCR
60.0000 mg | EXTENDED_RELEASE_TABLET | Freq: Three times a day (TID) | ORAL | Status: DC
  Administered 2015-06-06 – 2015-06-12 (×17): 60 mg via ORAL
  Filled 2015-06-06 (×17): qty 2

## 2015-06-06 MED ORDER — HALOPERIDOL LACTATE 5 MG/ML IJ SOLN
10.0000 mg | Freq: Four times a day (QID) | INTRAMUSCULAR | Status: DC | PRN
  Administered 2015-06-07: 10 mg via INTRAVENOUS
  Filled 2015-06-06: qty 2

## 2015-06-06 MED ORDER — LORAZEPAM 2 MG/ML IJ SOLN
0.5000 mg | Freq: Once | INTRAMUSCULAR | Status: AC
  Administered 2015-06-06: 0.5 mg via INTRAVENOUS
  Filled 2015-06-06: qty 1

## 2015-06-06 MED ORDER — DOCUSATE SODIUM 100 MG PO CAPS: 100.0000 mg | ORAL_CAPSULE | Freq: Three times a day (TID) | ORAL | Status: DC | PRN

## 2015-06-06 MED ORDER — HYDROXYZINE HCL 25 MG PO TABS
25.0000 mg | ORAL_TABLET | Freq: Three times a day (TID) | ORAL | Status: DC | PRN
  Administered 2015-06-06: 25 mg via ORAL
  Filled 2015-06-06: qty 1

## 2015-06-06 MED ORDER — MORPHINE SULFATE ER 30 MG PO TBCR
30.0000 mg | EXTENDED_RELEASE_TABLET | Freq: Two times a day (BID) | ORAL | Status: DC
  Administered 2015-06-06: 30 mg via ORAL
  Filled 2015-06-06: qty 1

## 2015-06-06 MED ORDER — POLYETHYLENE GLYCOL 3350 17 G PO PACK
17.0000 g | PACK | Freq: Every day | ORAL | Status: DC
  Administered 2015-06-06: 17 g via ORAL
  Filled 2015-06-06: qty 1

## 2015-06-06 MED ORDER — AMLODIPINE BESYLATE 5 MG PO TABS
5.0000 mg | ORAL_TABLET | Freq: Every day | ORAL | Status: DC
  Administered 2015-06-06 – 2015-06-08 (×3): 5 mg via ORAL
  Filled 2015-06-06 (×3): qty 1

## 2015-06-06 MED ORDER — POLYETHYLENE GLYCOL 3350 17 GM/SCOOP PO POWD
17.0000 g | Freq: Every day | ORAL | Status: DC
  Filled 2015-06-06: qty 255

## 2015-06-06 MED ORDER — CITALOPRAM HYDROBROMIDE 20 MG PO TABS
20.0000 mg | ORAL_TABLET | Freq: Every day | ORAL | Status: DC
  Administered 2015-06-06 – 2015-06-12 (×6): 20 mg via ORAL
  Filled 2015-06-06 (×7): qty 1

## 2015-06-06 MED ORDER — VANCOMYCIN HCL IN DEXTROSE 1-5 GM/200ML-% IV SOLN
1000.0000 mg | Freq: Two times a day (BID) | INTRAVENOUS | Status: DC
  Administered 2015-06-06 (×2): 1000 mg via INTRAVENOUS
  Filled 2015-06-06 (×2): qty 200

## 2015-06-06 MED ORDER — MORPHINE SULFATE 15 MG PO TABS
15.0000 mg | ORAL_TABLET | ORAL | Status: DC | PRN
  Administered 2015-06-06: 15 mg via ORAL
  Filled 2015-06-06: qty 1

## 2015-06-06 MED ORDER — LIP MEDEX EX OINT
TOPICAL_OINTMENT | CUTANEOUS | Status: AC
  Administered 2015-06-06: 1
  Filled 2015-06-06: qty 7

## 2015-06-06 MED ORDER — HALOPERIDOL LACTATE 5 MG/ML IJ SOLN
5.0000 mg | Freq: Four times a day (QID) | INTRAMUSCULAR | Status: DC | PRN
  Administered 2015-06-06: 5 mg via INTRAVENOUS
  Filled 2015-06-06: qty 1

## 2015-06-06 MED ORDER — METHOCARBAMOL 500 MG PO TABS: 500.0000 mg | ORAL_TABLET | Freq: Three times a day (TID) | ORAL | Status: DC | PRN

## 2015-06-06 NOTE — Progress Notes (Signed)
[facility] PROGRESS NOTE  William Jordan:[id number] DOB: July 20, 1946 DOA: 06/05/2015 PCP: [name], MD  Assessment/Plan: #1 sepsis Patient presenting with a fever with a temp of 101.4, heart rate of 103, white count of 17.5 and in acute renal failure with a creatinine of 3.8 BUN of 42 with confusion and also noted to be borderline hypotensive. Cells of infection unknown. Chest x-ray was negative for any acute infiltrate. Urinalysis was negative. Blood cultures pending. Urine cultures pending. Will repeat a chest x-ray today. Repeat UA with cultures and sensitivities. Continue empiric IV vancomycin and IV Zosyn. Follow.  #2 acute renal failure Questionable etiology. May be secondary to a prerenal azotemia in the setting of ACE inhibitor and possible postobstructive nephropathy as patient does have a history of BPH. CT abdomen and pelvis was negative for hydronephrosis. Foley catheter in place. Recently good urine output. Renal function improving. Creatinine on admission was 3.8 from 0.78 on 05/24/2015. Creatinine today is 1.33. Follow. Nephrology following and appreciate input and recommendations. Decrease IV fluids to 125 mL per hour.  #3 hyperkalemia Patient is status post calcium gluconate, insulin and D50, Kayexalate with resolution of hyperkalemia. ACE inhibitor on hold.  #4 urinary retention May be secondary to BPH. Patient status post Foley catheter placement. Repeat UA with cultures and sensitivities. Patient on empiric IV vancomycin and Zosyn.  #5 constipation Per CT abdomen and pelvis. Will place on a soapsuds enema.Will need bowel regimen.  #6 acute encephalopathy May be secondary to sepsis versus secondary to medication induced secondary to Neurontin and pain medications in the setting of acute renal failure. Some improvement clinically. Repeat UA with cultures and sensitivities. Repeat chest x-ray. Continue hydration with IV fluids. Continue empiric IV antibiotics. Will  follow.  #7 hypertension BP meds on hold. Follow for now.  #8 chronic pain Will place on a decreased dose of home regimen of long-acting MS Contin of 30 mg twice daily. MSIR for breakthrough pain. Will hold Neurontin for now.  #9 prophylaxis Heparin for DVT prophylaxis.   Code Status: Full Family Communication: Updated wife at bedside. Disposition Plan: Remain in the step down unit.   Consultants:  Nephrology: Dr. Jonnie Finner 06/05/2015  Procedures:  CT abdomen and pelvis 06/05/2015  Chest x-ray 06/05/2015  Antibiotics:  IV Rocephin 06/05/2015>>> 06/05/2015  IV azithromycin 06/05/2015>>>>06/05/2015  IV Zosyn 06/05/2015  IV vancomycin 06/05/2015  HPI/Subjective: Patient sitting up in bed alert and following commands. Patient is alert to self. Patient thinks he's in [facility]. Patient thinks its 2017.  Objective: Filed Vitals:   06/06/15 0829  BP: 117/67  Pulse:   Temp: 99.3 F (37.4 C)  Resp: 19    Intake/Output Summary (Last 24 hours) at 06/06/15 1004 Last data filed at 06/06/15 0500  Gross per 24 hour  Intake      0 ml  Output   1802 ml  Net  -1802 ml   Filed Weights   06/05/15 1250 06/05/15 2252 06/06/15 0500  Weight: 83.915 kg (185 lb) 90.1 kg (198 lb 10.2 oz) 88.7 kg (195 lb 8.8 oz)    Exam:   General:  NAD  Cardiovascular: RRR  Respiratory: CTAB  Abdomen: Soft, nontender, nondistended, positive bowel sounds.   Musculoskeletal: No clubbing cyanosis or edema.  Data Reviewed: Basic Metabolic Panel:  Recent Labs Lab 06/05/15 1323 06/05/15 1806 06/05/15 2053 06/06/15 0600  NA 130* 133* 132* 130*  K 6.9* 5.1 5.4* 4.7  CL 96* 102 101 100*  CO2 23 25 23 26   GLUCOSE  105* 39* 110* 104*  BUN 42* 40* 36* 28*  CREATININE 3.80* 2.87* 2.41* 1.33*  CALCIUM 8.6* 8.0* 7.9* 7.6*   Liver Function Tests:  Recent Labs Lab 06/05/15 1323 06/06/15 0600  AST 26 23  ALT 16* 16*  ALKPHOS 90 72  BILITOT 0.4 0.9  PROT 6.7 5.9*  ALBUMIN 3.8  3.1*   No results for input(s): LIPASE, AMYLASE in the last 168 hours. No results for input(s): AMMONIA in the last 168 hours. CBC:  Recent Labs Lab 06/05/15 1323 06/05/15 2053 06/06/15 0220  WBC 17.5* 16.7* 16.4*  NEUTROABS 14.5* 13.8*  --   HGB 9.7* 8.9* 8.5*  HCT 29.4* 27.9* 26.0*  MCV 89.9 89.7 90.9  PLT 346 274 310   Cardiac Enzymes: No results for input(s): CKTOTAL, CKMB, CKMBINDEX, TROPONINI in the last 168 hours. BNP (last 3 results) No results for input(s): BNP in the last 8760 hours.  ProBNP (last 3 results) No results for input(s): PROBNP in the last 8760 hours.  CBG:  Recent Labs Lab 06/05/15 1912  GLUCAP 59*    Recent Results (from the past 240 hour(s))  Culture, blood (routine x 2)     Status: None (Preliminary result)   Collection Time: 06/05/15 12:50 PM  Result Value Ref Range Status   Specimen Description   Final    BLOOD LEFT FOREARM Performed at [facility]    Special Requests BOTTLES DRAWN AEROBIC AND ANAEROBIC [facility]  Final   Culture PENDING  Incomplete   Report Status PENDING  Incomplete  Urine culture     Status: None (Preliminary result)   Collection Time: 06/05/15  2:15 PM  Result Value Ref Range Status   Specimen Description URINE, CATHETERIZED  Final   Special Requests NONE  Final   Culture   Final    NO GROWTH < 24 HOURS Performed at [facility]    Report Status PENDING  Incomplete  MRSA PCR Screening     Status: None   Collection Time: 06/05/15 11:51 PM  Result Value Ref Range Status   MRSA by PCR NEGATIVE NEGATIVE Final    Comment:        The GeneXpert MRSA Assay (FDA approved for NASAL specimens only), is one component of a comprehensive MRSA colonization surveillance program. It is not intended to diagnose MRSA infection nor to guide or monitor treatment for MRSA infections.      Studies: Ct Abdomen Pelvis Wo Contrast  06/05/2015   CLINICAL DATA:  Possible stroke. Patient is febrile, confused,  and dizzy. Recent urinary retention.  EXAM: CT ABDOMEN AND PELVIS WITHOUT CONTRAST  TECHNIQUE: Multidetector CT imaging of the abdomen and pelvis was performed following the standard protocol without IV contrast.  COMPARISON:  None.  FINDINGS: Lower chest: Mild bibasilar atelectasis, right greater than left. Significant coronary artery disease. Heart size is normal.  Upper abdomen: No focal abnormality within the liver, spleen, pancreas, or adrenal glands. Within the lower pole of the right kidney there is a hyperdense 1.9 cm lesion possibly representing a cyst but not characterized on this noncontrast exam. A 1.2 cm low-attenuation lesion is identified in the upper pole left kidney, likely representing a cyst. The gallbladder is present.  Gastrointestinal tract: The stomach and small bowel loops are normal in appearance. Large stool burden. The appendix is not seen.  Pelvis: There is significant streak artifact from right hip orthopedic hardware an electronic device in the right buttock for spinal stimulator. A Foley catheter is identified  within the urinary bladder, decompressing the bladder. There is no free pelvic fluid.  Retroperitoneum: Significant atherosclerotic calcification of the abdominal aorta. No aneurysm. No retroperitoneal or mesenteric adenopathy.  Abdominal wall: Unremarkable.  Osseous structures: Right hip arthroplasty. Wedge compression deformity of L2, L1, and T12. Significant degenerative changes throughout the spine. No suspicious lytic or blastic lesions are identified.  IMPRESSION: 1.  No evidence for acute  abnormality. 2. Significant coronary artery disease. 3. Hyperdense lesion within the lower pole of the right kidney, not characterized on this exam. Consider further evaluation with contrast-enhanced exam. MRI should be performed when the patient is clinically stable and able to follow breath holding instructions (usually best performed on an outpatient basis). 4. Large stool burden. 5.  Appendix not seen. 6. Foley catheter decompresses the bladder. 7. Numerous wedge compression fractures in the lower thoracic and lumbar spine.   Electronically Signed   By: Nolon Nations M.D.   On: 06/05/2015 18:48   Dg Chest Port 1 View  06/05/2015   CLINICAL DATA:  Sepsis  EXAM: PORTABLE CHEST - 1 VIEW  COMPARISON:  05/24/2015  FINDINGS: There is vascular congestion. Low lung volumes with bibasilar atelectasis. No effusions. Heart is upper limits normal in size. No acute bony abnormality.  IMPRESSION: Vascular congestion with low volumes and bibasilar atelectasis.   Electronically Signed   By: Rolm Baptise M.D.   On: 06/05/2015 15:18    Scheduled Meds: . heparin  5,000 Units Subcutaneous 3 times per day  . piperacillin-tazobactam (ZOSYN)  IV  3.375 g Intravenous Q8H  . sodium chloride  3 mL Intravenous Q12H  . vancomycin  1,000 mg Intravenous Q24H   Continuous Infusions: . sodium chloride 150 mL/hr at 06/06/15 9326    Principal Problem:   Sepsis Active Problems:   Chronic pain   DDD (degenerative disc disease), lumbar   Polypharmacy   Degenerative arthritis of thoracic spine   Toxic metabolic encephalopathy   AKI (acute kidney injury)   Hyperkalemia   Constipation    Time spent: 62 mins    [facility] MD [facility] Pager [phone]. If 7PM-7AM, please contact night-coverage at [email or web address], password [facility] 06/06/2015, 10:04 AM  LOS: 1 day

## 2015-06-06 NOTE — Progress Notes (Signed)
Pt restless, bed alarm on, getting oob, continues to pull at tubing. Mittens on for safety, prn Ativan given. Will continue to monitor. Jeanie Sewer, RN 7:17 AM 06/06/2015

## 2015-06-06 NOTE — Care Management Note (Signed)
Case Management Note  Patient Details  Name: William Jordan MRN: [id number] Date of Birth: 1946-09-03  Subjective/Objective:                 sepsis   Action/Plan:Date:  Sept. 21, 2016 U.R. performed for needs and level of care. Will continue to follow for Case Management needs.  Velva Harman, RN, BSN, [place]   [phone]   Expected Discharge Date:   (unknown)               Expected Discharge Plan:  Home/Self Care  In-House Referral:  NA  Discharge planning Services  CM Consult  Post Acute Care Choice:  NA Choice offered to:  NA  DME Arranged:    DME Agency:     HH Arranged:    HH Agency:     Status of Service:  In process, will continue to follow  Medicare Important Message Given:    Date Medicare IM Given:    Medicare IM give by:    Date Additional Medicare IM Given:    Additional Medicare Important Message give by:     If discussed at [place] of Stay Meetings, dates discussed:    Additional Comments:  Leeroy Cha, RN 06/06/2015, 10:54 AM

## 2015-06-06 NOTE — Progress Notes (Addendum)
ANTIBIOTIC CONSULT NOTE - FOLLOW UP  Pharmacy Consult for Vancomycin, Zosyn Indication: rule out sepsis  No Known Allergies  Patient Measurements: Height: 6\' 5"  (195.6 cm) Weight: 195 lb 8.8 oz (88.7 kg) IBW/kg (Calculated) : 89.1  Vital Signs: Temp: 99.1 F (37.3 C) (09/21 1306) BP: 147/67 mmHg (09/21 1306) Pulse Rate: 98 (09/21 1306) Intake/Output from previous day: 09/20 0701 - 09/21 0700 In: -  Out: 1802 [Urine:1800; Stool:2] Intake/Output from this shift: Total I/O In: -  Out: 851 [Urine:850; Stool:1]  Labs:  Recent Labs  06/05/15 1323 06/05/15 1543 06/05/15 1806 06/05/15 2053 06/06/15 0220 06/06/15 0600  WBC 17.5*  --   --  16.7* 16.4*  --   HGB 9.7*  --   --  8.9* 8.5*  --   PLT 346  --   --  274 310  --   LABCREA  --  233.21  --   --   --   --   CREATININE 3.80*  --  2.87* 2.41*  --  1.33*   Estimated Creatinine Clearance: 67.6 mL/min (by C-G formula based on Cr of 1.33). No results for input(s): VANCOTROUGH, VANCOPEAK, VANCORANDOM, GENTTROUGH, GENTPEAK, GENTRANDOM, TOBRATROUGH, TOBRAPEAK, TOBRARND, AMIKACINPEAK, AMIKACINTROU, AMIKACIN in the last 72 hours.   Microbiology: Recent Results (from the past 720 hour(s))  Culture, blood (routine x 2)     Status: None (Preliminary result)   Collection Time: 06/05/15 12:50 PM  Result Value Ref Range Status   Specimen Description BLOOD LEFT FOREARM  Final   Special Requests BOTTLES DRAWN AEROBIC AND ANAEROBIC 5CC EACH  Final   Culture   Final    NO GROWTH < 24 HOURS Performed at [facility]    Report Status PENDING  Incomplete  Culture, blood (routine x 2)     Status: None (Preliminary result)   Collection Time: 06/05/15  1:00 PM  Result Value Ref Range Status   Specimen Description BLOOD RIGHT HAND  Final   Special Requests BOTTLES DRAWN AEROBIC ONLY [place]  Final   Culture   Final    NO GROWTH < 24 HOURS Performed at [facility]    Report Status PENDING  Incomplete  Urine culture      Status: None (Preliminary result)   Collection Time: 06/05/15  2:15 PM  Result Value Ref Range Status   Specimen Description URINE, CATHETERIZED  Final   Special Requests NONE  Final   Culture   Final    NO GROWTH < 24 HOURS Performed at [facility]    Report Status PENDING  Incomplete  MRSA PCR Screening     Status: None   Collection Time: 06/05/15 11:51 PM  Result Value Ref Range Status   MRSA by PCR NEGATIVE NEGATIVE Final    Comment:        The GeneXpert MRSA Assay (FDA approved for NASAL specimens only), is one component of a comprehensive MRSA colonization surveillance program. It is not intended to diagnose MRSA infection nor to guide or monitor treatment for MRSA infections.     Anti-infectives    Start     Dose/Rate Route Frequency Ordered Stop   06/06/15 2100  vancomycin (VANCOCIN) IVPB 1000 mg/200 mL premix  Status:  Discontinued     1,000 mg 200 mL/hr over 60 Minutes Intravenous Every 24 hours 06/05/15 2014 06/06/15 1318   06/06/15 1330  vancomycin (VANCOCIN) IVPB 1000 mg/200 mL premix     1,000 mg 200 mL/hr over 60 Minutes Intravenous Every  12 hours 06/06/15 1318     06/06/15 0400  piperacillin-tazobactam (ZOSYN) IVPB 3.375 g     3.375 g 12.5 mL/hr over 240 Minutes Intravenous Every 8 hours 06/05/15 2013     06/05/15 2000  piperacillin-tazobactam (ZOSYN) IVPB 3.375 g     3.375 g 100 mL/hr over 30 Minutes Intravenous  Once 06/05/15 1959 06/05/15 2107   06/05/15 2000  vancomycin (VANCOCIN) IVPB 1000 mg/200 mL premix     1,000 mg 200 mL/hr over 60 Minutes Intravenous  Once 06/05/15 1959 06/05/15 2222   06/05/15 1345  cefTRIAXone (ROCEPHIN) 1 g in dextrose 5 % 50 mL IVPB     1 g 100 mL/hr over 30 Minutes Intravenous  Once 06/05/15 1335 06/05/15 1449   06/05/15 1345  azithromycin (ZITHROMAX) 500 mg in dextrose 5 % 250 mL IVPB     500 mg 250 mL/hr over 60 Minutes Intravenous  Once 06/05/15 1335 06/05/15 1611     Assessment: 69 yr male with altered  mental status.  Recently on ciprofloxacin for prostatitis.  Patient to be admitted with sepsis with likely urinary source.  Pharmacy consulted to dose Vancomycin & Zosyn for sepsis.  9/20 >>Ceftriaxone x 1 >>9/20 9/20 >>Azithromycin x 1 >> 9/20 9/20 >> Vanc >> 9/20 >> Zosyn >>   9/20 blood: ngtd 9/20 urine: ngtd 9/21 urine repeat: IP  Today, 06/06/2015: Temp: now afebrile WBC: elevated; sl improved Renal: AKI improving; CrCl 68 CG PCT/LA unremarkable   Goal of Therapy:  Vancomycin trough level 15-20 mcg/ml  Eradication of infection Appropriate antibiotic dosing for indication and renal function  Plan:  Day 2 antibiotics Increase vancomycin to 1000 mg IV q12 hr with improved renal function; still with leukocytosis and recent fevers Measure vancomycin trough levels at steady state as indicated Continue Zosyn as ordered  Follow clinical course, renal function, culture results as available  Follow for de-escalation of antibiotics and LOT   Reuel Boom, PharmD, BCPS Pager: [phone] 06/06/2015, 1:19 PM

## 2015-06-06 NOTE — Progress Notes (Signed)
  [facility] Progress Note   Subjective: more alert, still confused, creat down to 1.33.  Doesn't recall any recent history. Family not sure of what meds he has taken recently. Jerking is better.   Filed Vitals:   06/06/15 0600 06/06/15 0800 06/06/15 0829 06/06/15 1306  BP: 127/65  117/67 147/67  Pulse: 89 102  98  Temp: 100 F (37.8 C) 99.5 F (37.5 C) 99.3 F (37.4 C) 99.1 F (37.3 C)  TempSrc:      Resp: 17 21 19 22   Height:      Weight:      SpO2: 91% 98%  98%   Exam: Groggy, still agitated but much less, confused, responsive No jvd Chest is clear bilat RRR no MRG Abd is soft ntnd no mass or ascites GU foley in place No LE edema or UE edema Neuro is alert, nf, Ox 1, myoclonic jerking resolved  UA > amber, 5.0, 1.022, no rbc/ wbc's CXR > low lung volumes, bibasilar atx, ?vasc congestion CT abd > multiple vertebral comp fractures (lower thor/ lumbar), no acute changes, large stool burden, no hydro Assessment: 1. Acute renal failure - functional AKI due to vol depletion/ hypotension/ ACEi effect. UA neg, CT no hydro. Much better with IVF's.  2. Hyperkalemia - resolved 3. AMS - neurontin and/or narcotic toxicity (accumulates in dehydration/ renal failure) suspected; improved, myoclonus resolved 4. HTN holding norvasc/ Lotensin. Would avoid ACEi in this patient for 6-8 weeks, use alternate agents for BP control as needed.  5. Chronic pain / spinal stimulator/ chronic narcotic dependence 6. DJD w hx multiple joint replacement surgeries  Plan - avoid ACEi, cont IVF"s. Expect continued improvement, will sign off.     Kelly Splinter MD  pager [phone]    cell [phone]  06/06/2015, 2:58 PM     Recent Labs Lab 06/05/15 1806 06/05/15 2053 06/06/15 0600  NA 133* 132* 130*  K 5.1 5.4* 4.7  CL 102 101 100*  CO2 25 23 26   GLUCOSE 39* 110* 104*  BUN 40* 36* 28*  CREATININE 2.87* 2.41* 1.33*  CALCIUM 8.0* 7.9* 7.6*    Recent Labs Lab 06/05/15 1323  06/06/15 0600  AST 26 23  ALT 16* 16*  ALKPHOS 90 72  BILITOT 0.4 0.9  PROT 6.7 5.9*  ALBUMIN 3.8 3.1*    Recent Labs Lab 06/05/15 1323 06/05/15 2053 06/06/15 0220  WBC 17.5* 16.7* 16.4*  NEUTROABS 14.5* 13.8*  --   HGB 9.7* 8.9* 8.5*  HCT 29.4* 27.9* 26.0*  MCV 89.9 89.7 90.9  PLT 346 274 310   . citalopram  20 mg Oral Daily  . heparin  5,000 Units Subcutaneous 3 times per day  . morphine  30 mg Oral Q12H  . piperacillin-tazobactam (ZOSYN)  IV  3.375 g Intravenous Q8H  . sodium chloride  3 mL Intravenous Q12H  . vancomycin  1,000 mg Intravenous Q12H   . sodium chloride 125 mL/hr at 06/06/15 1028   acetaminophen **OR** acetaminophen, alum & mag hydroxide-simeth, docusate sodium, hydrOXYzine, morphine, morphine injection, ondansetron **OR** ondansetron (ZOFRAN) IV, sodium polystyrene

## 2015-06-07 ENCOUNTER — Inpatient Hospital Stay (HOSPITAL_COMMUNITY): Payer: Medicare Other

## 2015-06-07 DIAGNOSIS — A4101 Sepsis due to Methicillin susceptible Staphylococcus aureus: Secondary | ICD-10-CM | POA: Diagnosis present

## 2015-06-07 DIAGNOSIS — G9340 Encephalopathy, unspecified: Secondary | ICD-10-CM | POA: Diagnosis present

## 2015-06-07 DIAGNOSIS — R509 Fever, unspecified: Secondary | ICD-10-CM

## 2015-06-07 DIAGNOSIS — R410 Disorientation, unspecified: Secondary | ICD-10-CM

## 2015-06-07 LAB — CBC WITH DIFFERENTIAL/PLATELET
BASOS ABS: 0 10*3/uL (ref 0.0–0.1)
Basophils Relative: 0 %
Eosinophils Absolute: 0.1 10*3/uL (ref 0.0–0.7)
Eosinophils Relative: 1 %
HEMATOCRIT: 27.1 % — AB (ref 39.0–52.0)
Hemoglobin: 9.1 g/dL — ABNORMAL LOW (ref 13.0–17.0)
LYMPHS PCT: 8 %
Lymphs Abs: 1.3 10*3/uL (ref 0.7–4.0)
MCH: 29.8 pg (ref 26.0–34.0)
MCHC: 33.6 g/dL (ref 30.0–36.0)
MCV: 88.9 fL (ref 78.0–100.0)
MONO ABS: 1.8 10*3/uL — AB (ref 0.1–1.0)
Monocytes Relative: 11 %
NEUTROS ABS: 12.9 10*3/uL — AB (ref 1.7–7.7)
Neutrophils Relative %: 80 %
Platelets: 260 10*3/uL (ref 150–400)
RBC: 3.05 MIL/uL — AB (ref 4.22–5.81)
RDW: 13.9 % (ref 11.5–15.5)
WBC: 16.1 10*3/uL — AB (ref 4.0–10.5)

## 2015-06-07 LAB — URINE CULTURE: Culture: NO GROWTH

## 2015-06-07 LAB — BASIC METABOLIC PANEL
ANION GAP: 8 (ref 5–15)
BUN: 10 mg/dL (ref 6–20)
CHLORIDE: 100 mmol/L — AB (ref 101–111)
CO2: 24 mmol/L (ref 22–32)
Calcium: 8.1 mg/dL — ABNORMAL LOW (ref 8.9–10.3)
Creatinine, Ser: 0.69 mg/dL (ref 0.61–1.24)
GFR calc Af Amer: >60 mL/min (ref >60)
GFR calc non Af Amer: >60 mL/min (ref >60)
GLUCOSE: 103 mg/dL — AB (ref 65–99)
POTASSIUM: 4 mmol/L (ref 3.5–5.1)
Sodium: 132 mmol/L — ABNORMAL LOW (ref 135–145)

## 2015-06-07 LAB — MAGNESIUM: Magnesium: 1.5 mg/dL — ABNORMAL LOW (ref 1.7–2.4)

## 2015-06-07 LAB — VANCOMYCIN, TROUGH: Vancomycin Tr: 8 ug/mL — ABNORMAL LOW (ref 10.0–20.0)

## 2015-06-07 MED ORDER — DEXTROSE 5 % IV SOLN
2.0000 g | INTRAVENOUS | Status: AC
  Administered 2015-06-07: 2 g via INTRAVENOUS
  Filled 2015-06-07: qty 2

## 2015-06-07 MED ORDER — GABAPENTIN 300 MG PO CAPS
600.0000 mg | ORAL_CAPSULE | Freq: Every day | ORAL | Status: DC
  Administered 2015-06-07 – 2015-06-11 (×5): 600 mg via ORAL
  Filled 2015-06-07 (×8): qty 2

## 2015-06-07 MED ORDER — DEXMEDETOMIDINE BOLUS VIA INFUSION
1.0000 ug/kg | Freq: Once | INTRAVENOUS | Status: AC
  Administered 2015-06-07: 87.7 ug via INTRAVENOUS
  Filled 2015-06-07: qty 88

## 2015-06-07 MED ORDER — VANCOMYCIN HCL IN DEXTROSE 1-5 GM/200ML-% IV SOLN
1000.0000 mg | Freq: Three times a day (TID) | INTRAVENOUS | Status: DC
  Administered 2015-06-07: 1000 mg via INTRAVENOUS
  Filled 2015-06-07: qty 200

## 2015-06-07 MED ORDER — MAGNESIUM SULFATE 4 GM/100ML IV SOLN
4.0000 g | Freq: Once | INTRAVENOUS | Status: AC
  Administered 2015-06-07: 4 g via INTRAVENOUS
  Filled 2015-06-07: qty 100

## 2015-06-07 MED ORDER — DEXMEDETOMIDINE HCL IN NACL 200 MCG/50ML IV SOLN
0.4000 ug/kg/h | INTRAVENOUS | Status: DC
  Administered 2015-06-07: 0.6 ug/kg/h via INTRAVENOUS
  Administered 2015-06-07: 0.5 ug/kg/h via INTRAVENOUS
  Administered 2015-06-07 (×2): 0.4 ug/kg/h via INTRAVENOUS
  Administered 2015-06-07 (×3): 0.999 ug/kg/h via INTRAVENOUS
  Administered 2015-06-08 (×2): 0.8 ug/kg/h via INTRAVENOUS
  Administered 2015-06-08: 0.5 ug/kg/h via INTRAVENOUS
  Filled 2015-06-07 (×11): qty 50

## 2015-06-07 MED ORDER — DEXTROSE 5 % IV SOLN: 10.0000 mg/kg | Freq: Three times a day (TID) | INTRAVENOUS | Status: DC

## 2015-06-07 MED ORDER — GABAPENTIN 600 MG PO TABS: 300.0000 mg | ORAL_TABLET | Freq: Three times a day (TID) | ORAL | Status: DC

## 2015-06-07 MED ORDER — DEXTROSE 5 % IV SOLN
2.0000 g | Freq: Three times a day (TID) | INTRAVENOUS | Status: DC
  Administered 2015-06-08 – 2015-06-09 (×5): 2 g via INTRAVENOUS
  Filled 2015-06-07 (×7): qty 2

## 2015-06-07 MED ORDER — ACYCLOVIR SODIUM 50 MG/ML IV SOLN
10.0000 mg/kg | INTRAVENOUS | Status: DC
  Filled 2015-06-07: qty 17.5

## 2015-06-07 MED ORDER — VANCOMYCIN HCL IN DEXTROSE 1-5 GM/200ML-% IV SOLN
1000.0000 mg | Freq: Three times a day (TID) | INTRAVENOUS | Status: DC
  Administered 2015-06-07 – 2015-06-09 (×6): 1000 mg via INTRAVENOUS
  Filled 2015-06-07 (×6): qty 200

## 2015-06-07 MED ORDER — GABAPENTIN 300 MG PO CAPS
300.0000 mg | ORAL_CAPSULE | Freq: Three times a day (TID) | ORAL | Status: DC
  Administered 2015-06-07 – 2015-06-12 (×16): 300 mg via ORAL
  Filled 2015-06-07 (×25): qty 1

## 2015-06-07 NOTE — Progress Notes (Signed)
[place] Progress Note Patient Name: William Jordan DOB: [birth year] MRN: [id number]   Date of Service  06/07/2015  HPI/Events of Note  Severe agitated delirium refractory to haloperidol  eICU Interventions  dexmedetomidine ordered     Intervention Category Major Interventions: Delirium, psychosis, severe agitation - evaluation and management  Merton Border 06/07/2015, 5:27 AM

## 2015-06-07 NOTE — Progress Notes (Signed)
[facility] PROGRESS NOTE  William Jordan CZY:[id number] DOB: October 28, 1945 DOA: 06/05/2015 PCP: [name], MD  Assessment/Plan: #1 sepsis Patient presenting with a fever with a temp of 101.4, heart rate of 103, white count of 17.5 and in acute renal failure with a creatinine of 3.8 BUN of 42 with confusion and also noted to be borderline hypotensive. Source of infection unknown. Chest x-ray was negative for any acute infiltrate. Urinalysis was negative. Blood cultures pending. Urine cultures pending.  Repeat chest x-ray yesterday was negative. Repeat urinalysis with small leukocytes negative nitrites. Urine cultures pending.  Patient with low-grade fever this morning and slight improvement with leukocytosis. Continue empiric IV vancomycin and IV Zosyn. ID consult for further evaluation and rcxs. Follow.  #2 acute renal failure Questionable etiology. May be secondary to a prerenal azotemia in the setting of ACE inhibitor and possible postobstructive nephropathy as patient does have a history of BPH. CT abdomen and pelvis was negative for hydronephrosis. Foley catheter in place.  Patient with good urine output and had 3.34 liters urine output yesterday. Renal function improving. Creatinine on admission was 3.8 from 0.78 on 05/24/2015. Creatinine today is  0.69. Follow. Nephrology following and appreciate input and recommendations. Decrease IV fluids to 75 mL per hour.  #3 hyperkalemia/ hypomagnesemia Patient is status post calcium gluconate, insulin and D50, Kayexalate with resolution of hyperkalemia. ACE inhibitor on hold. Resolved. Replete magnesium and keep greater than 2.  #4 urinary retention May be secondary to BPH. Patient status post Foley catheter placement. Repeat UA with cultures and sensitivities pending. Patient on empiric IV vancomycin and Zosyn.  #5 constipation Per CT abdomen and pelvis.  Patient status post soapsuds enema with good bowel movement yesterday. Will need bowel  regimen.  #6 acute encephalopathy May be secondary to  Withdrawal from MS Contin and Neurontin as patient's wife stated that patient suddenly stopped taking all his medications 1-2 weeks prior to admission.  Also on the differential includes sepsis versus secondary to medication induced secondary to Neurontin and pain medications in the setting of acute renal failure. Patient very agitated yesterday and delirious and confused and had to be placed in soft restraints. Patient with no improvement with soft restraints and was placed on Ativan as well as Haldol with no significant improvement with his agitation and delirium. Repeat chest x-ray negative for any acute infiltrate. Repeat urinalysis  With small leukocytes nitrite negative. Urine cultures pending. Continue empiric IV antibiotics and IV fluids. Continue Precedex drip that patient was started on overnight secondary to his delirium and continued agitation. Will consult with critical care medicine to help with management of Precedex drip until further evaluation. Will get a head CT.  #7 hypertension  Decrease IV fluids. Continue Norvasc.  #8 chronic pain Continue home regimen of long-acting MS Contin of 60 mg every 8 hours. Resume neurontin at 1/2 home dose.    #9 leukocytosis  questionable etiology. Patient has been pancultured and results are pending. Patient still with low-grade fever this morning. Continue empiric IV vancomycin and IV Zosyn.  #10 prophylaxis Heparin for DVT prophylaxis.   Code Status: Full Family Communication: Updated wife at bedside. Disposition Plan: Remain in the step down unit.   Consultants:  Nephrology: Dr. Jonnie Finner 06/05/2015  Procedures:  CT abdomen and pelvis 06/05/2015  Chest x-ray 06/05/2015, 06/06/2015  Antibiotics:  IV Rocephin 06/05/2015>>> 06/05/2015  IV azithromycin 06/05/2015>>>>06/05/2015  IV Zosyn 06/05/2015  IV vancomycin 06/05/2015  HPI/Subjective:  events overnight noted. Patient  was severely agitated and delirious  Ativan and Haldol and patient was subsequently placed on the Precedex drip per critical care.  Objective: Filed Vitals:   06/07/15 0830  BP: 168/78  Pulse: 60  Temp: 98.6 F (37 C)  Resp: 21    Intake/Output Summary (Last 24 hours) at 06/07/15 0915 Last data filed at 06/07/15 0657  Gross per 24 hour  Intake 1807.54 ml  Output   3342 ml  Net -1534.46 ml   Filed Weights   06/05/15 2252 06/06/15 0500 06/07/15 0400  Weight: 90.1 kg (198 lb 10.2 oz) 88.7 kg (195 lb 8.8 oz) 87.7 kg (193 lb 5.5 oz)    Exam:   General:  Sleeping.  Cardiovascular: RRR  Respiratory: CTAB anterior lung fields  Abdomen: Soft, nontender, nondistended, positive bowel sounds.   Musculoskeletal: No clubbing cyanosis or edema.  Data Reviewed: Basic Metabolic Panel:  Recent Labs Lab 06/05/15 1323 06/05/15 1806 06/05/15 2053 06/06/15 0600 06/07/15 0410  NA 130* 133* 132* 130* 132*  K 6.9* 5.1 5.4* 4.7 4.0  CL 96* 102 101 100* 100*  CO2 23 25 23 26 24   GLUCOSE 105* 39* 110* 104* 103*  BUN 42* 40* 36* 28* 10  CREATININE 3.80* 2.87* 2.41* 1.33* 0.69  CALCIUM 8.6* 8.0* 7.9* 7.6* 8.1*  MG  --   --   --   --  1.5*   Liver Function Tests:  Recent Labs Lab 06/05/15 1323 06/06/15 0600  AST 26 23  ALT 16* 16*  ALKPHOS 90 72  BILITOT 0.4 0.9  PROT 6.7 5.9*  ALBUMIN 3.8 3.1*   No results for input(s): LIPASE, AMYLASE in the last 168 hours. No results for input(s): AMMONIA in the last 168 hours. CBC:  Recent Labs Lab 06/05/15 1323 06/05/15 2053 06/06/15 0220 06/07/15 0410  WBC 17.5* 16.7* 16.4* 16.1*  NEUTROABS 14.5* 13.8*  --  12.9*  HGB 9.7* 8.9* 8.5* 9.1*  HCT 29.4* 27.9* 26.0* 27.1*  MCV 89.9 89.7 90.9 88.9  PLT 346 274 310 260   Cardiac Enzymes: No results for input(s): CKTOTAL, CKMB, CKMBINDEX, TROPONINI in the last 168 hours. BNP (last 3 results) No results for input(s): BNP in the last 8760 hours.  ProBNP (last 3 results) No  results for input(s): PROBNP in the last 8760 hours.  CBG:  Recent Labs Lab 06/05/15 1912  GLUCAP 59*    Recent Results (from the past 240 hour(s))  Culture, blood (routine x 2)     Status: None (Preliminary result)   Collection Time: 06/05/15 12:50 PM  Result Value Ref Range Status   Specimen Description BLOOD LEFT FOREARM  Final   Special Requests BOTTLES DRAWN AEROBIC AND ANAEROBIC 5CC EACH  Final   Culture   Final    NO GROWTH < 24 HOURS Performed at [facility]    Report Status PENDING  Incomplete  Culture, blood (routine x 2)     Status: None (Preliminary result)   Collection Time: 06/05/15  1:00 PM  Result Value Ref Range Status   Specimen Description BLOOD RIGHT HAND  Final   Special Requests BOTTLES DRAWN AEROBIC ONLY [place]  Final   Culture   Final    NO GROWTH < 24 HOURS Performed at [facility]    Report Status PENDING  Incomplete  Urine culture     Status: None   Collection Time: 06/05/15  2:15 PM  Result Value Ref Range Status   Specimen Description URINE, CATHETERIZED  Final   Special Requests NONE  Final  Culture   Final    NO GROWTH 1 DAY Performed at [facility]    Report Status 06/06/2015 FINAL  Final  MRSA PCR Screening     Status: None   Collection Time: 06/05/15 11:51 PM  Result Value Ref Range Status   MRSA by PCR NEGATIVE NEGATIVE Final    Comment:        The GeneXpert MRSA Assay (FDA approved for NASAL specimens only), is one component of a comprehensive MRSA colonization surveillance program. It is not intended to diagnose MRSA infection nor to guide or monitor treatment for MRSA infections.      Studies: Ct Abdomen Pelvis Wo Contrast  06/05/2015   CLINICAL DATA:  Possible stroke. Patient is febrile, confused, and dizzy. Recent urinary retention.  EXAM: CT ABDOMEN AND PELVIS WITHOUT CONTRAST  TECHNIQUE: Multidetector CT imaging of the abdomen and pelvis was performed following the standard protocol without IV  contrast.  COMPARISON:  None.  FINDINGS: Lower chest: Mild bibasilar atelectasis, right greater than left. Significant coronary artery disease. Heart size is normal.  Upper abdomen: No focal abnormality within the liver, spleen, pancreas, or adrenal glands. Within the lower pole of the right kidney there is a hyperdense 1.9 cm lesion possibly representing a cyst but not characterized on this noncontrast exam. A 1.2 cm low-attenuation lesion is identified in the upper pole left kidney, likely representing a cyst. The gallbladder is present.  Gastrointestinal tract: The stomach and small bowel loops are normal in appearance. Large stool burden. The appendix is not seen.  Pelvis: There is significant streak artifact from right hip orthopedic hardware an electronic device in the right buttock for spinal stimulator. A Foley catheter is identified within the urinary bladder, decompressing the bladder. There is no free pelvic fluid.  Retroperitoneum: Significant atherosclerotic calcification of the abdominal aorta. No aneurysm. No retroperitoneal or mesenteric adenopathy.  Abdominal wall: Unremarkable.  Osseous structures: Right hip arthroplasty. Wedge compression deformity of L2, L1, and T12. Significant degenerative changes throughout the spine. No suspicious lytic or blastic lesions are identified.  IMPRESSION: 1.  No evidence for acute  abnormality. 2. Significant coronary artery disease. 3. Hyperdense lesion within the lower pole of the right kidney, not characterized on this exam. Consider further evaluation with contrast-enhanced exam. MRI should be performed when the patient is clinically stable and able to follow breath holding instructions (usually best performed on an outpatient basis). 4. Large stool burden. 5. Appendix not seen. 6. Foley catheter decompresses the bladder. 7. Numerous wedge compression fractures in the lower thoracic and lumbar spine.   Electronically Signed   By: Nolon Nations M.D.   On:  06/05/2015 18:48   Dg Chest Port 1 View  06/06/2015   CLINICAL DATA:  Sepsis.  Altered mental status.  EXAM: PORTABLE CHEST - 1 VIEW  COMPARISON:  06/05/2015  FINDINGS: Dorsal column stimulator remains in place.  Left humeral prosthesis.  Atherosclerotic aortic arch. Borderline enlargement of the cardiopericardial silhouette upper zone pulmonary vascular prominence may be due to positioning.  Right mid clavicular deformity from old fracture. Degenerative right glenohumeral arthropathy.  Thoracic spondylosis. Low lung volumes are present, causing crowding of the pulmonary vasculature.  IMPRESSION: 1. Borderline enlargement of the cardiopericardial silhouette. Cephalization of blood flow could be from pulmonary venous hypertension or supine positioning. 2. Atherosclerotic aortic arch. 3. Low lung volumes.   Electronically Signed   By: Van Clines M.D.   On: 06/06/2015 10:46   Dg Chest [facility]  06/05/2015   CLINICAL DATA:  Sepsis  EXAM: PORTABLE CHEST - 1 VIEW  COMPARISON:  05/24/2015  FINDINGS: There is vascular congestion. Low lung volumes with bibasilar atelectasis. No effusions. Heart is upper limits normal in size. No acute bony abnormality.  IMPRESSION: Vascular congestion with low volumes and bibasilar atelectasis.   Electronically Signed   By: Rolm Baptise M.D.   On: 06/05/2015 15:18    Scheduled Meds: . amLODipine  5 mg Oral Daily  . citalopram  20 mg Oral Daily  . gabapentin  300 mg Oral TID AC  . gabapentin  600 mg Oral QHS  . heparin  5,000 Units Subcutaneous 3 times per day  . magnesium sulfate 1 - 4 g bolus IVPB  4 g Intravenous Once  . morphine  60 mg Oral Q8H  . piperacillin-tazobactam (ZOSYN)  IV  3.375 g Intravenous Q8H  . polyethylene glycol  17 g Oral Daily  . sodium chloride  3 mL Intravenous Q12H  . vancomycin  1,000 mg Intravenous Q12H   Continuous Infusions: . sodium chloride 100 mL/hr (06/07/15 0816)  . dexmedetomidine 0.999 mcg/kg/hr (06/07/15 2395)     Principal Problem:   Sepsis Active Problems:   Chronic pain   DDD (degenerative disc disease), lumbar   Polypharmacy   Degenerative arthritis of thoracic spine   Toxic metabolic encephalopathy   AKI (acute kidney injury)   Hyperkalemia   Constipation   Acute renal failure syndrome    Time spent: 90 mins    [facility] MD [facility] Pager [phone]. If 7PM-7AM, please contact night-coverage at [email or web address], password [facility] 06/07/2015, 9:15 AM  LOS: 2 days

## 2015-06-07 NOTE — Consult Note (Signed)
Name: William Jordan MRN: [id number] DOB: 10/31/1945    ADMISSION DATE:  06/05/2015 CONSULTATION DATE:  06/07/15  REFERRING MD :  Dr. Grandville Silos   CHIEF COMPLAINT:  Agitated Delirium   BRIEF PATIENT DESCRIPTION: 69 y/o M, former smoker, with a PMH of HTN, anxiety / depression, chronic pain on narcotics who was admitted on 9/20 with altered mental status.  He was found to have leukocytosis (17.5), fever (102.4) and acute kidney injury.  He was empirically treated for sepsis and has been culture negative thus far.  He developed worsening of agitated delirium and was placed on Precedex.  PCCM consulted for evaluation.    SIGNIFICANT EVENTS  9/20  Admit with altered mental status, fever, leukocytosis, and AKI 9/22  Worsening delirium, Rx'd with precedex   STUDIES:  9/20  CT ABD/Pelvis w/o >> no acute abnormality, no CAD, hyperdense lesion in lower pole of the R kidney, large stool burden, numerous compression fractures in the lower thoracic & lumbar spine 9/21  CXR >> mild vascular congestion, low lung volumes   HISTORY OF PRESENT ILLNESS:  69 y/o M, former smoker, with a PMH of HTN, anxiety / depression, chronic pain on narcotics who was admitted on 9/20 with altered mental status.    The patients wife reports he "has not been himself" over the past three weeks.  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  She notes he may have mixed up his medications at home, possibly forgetting doses.  His behavior at home was erratic with unusual behavior - leaving the back door open, faucet running until it almost ran out into the floor, forgetting his wifes name etc.    On 9/8, he was seen in the ER and treated with CIPRO for prostatitis.  He later was seen on 9/11 at [facility] for urinary retention and underwent in/out catheterization. He followed up with Urology and was instructed to discontinue ciprofloxacin.  He continued to have intermittent confusion and weakness.  On the am  of presentation, his wife noted him to be acutely worse and EMS found him to have a temp of 102.4.  Further work up was notable for leukocytosis (17.5), fever and acute kidney injury.  He was empirically treated for sepsis and has been culture negative thus far.  He developed worsening of agitated delirium and was placed on Precedex.  PCCM consulted for evaluation.  PAST MEDICAL HISTORY :   has a past medical history of Hypertension; Anxiety; Enlarged prostate; Neuropathy; Arthritis; and History of blood transfusion.  has past surgical history that includes Joint replacement (Left); Joint replacement (Right); Joint replacement (Left); Elbow surgery (Right); Hernia repair; Tonsillectomy; Vasectomy; Colonoscopy; and Spinal cord stimulator insertion (N/A, 09/28/2014).   Prior to Admission medications   Medication Sig Start Date End Date Taking? Authorizing Provider  amLODipine (NORVASC) 5 MG tablet Take 5 mg by mouth daily.   Yes Historical Provider, MD  benazepril (LOTENSIN) 20 MG tablet Take 20 mg by mouth daily.   Yes Historical Provider, MD  citalopram (CELEXA) 10 MG tablet Take 20 mg by mouth daily.   Yes Historical Provider, MD  docusate sodium (COLACE) 100 MG capsule Take 1 capsule (100 mg total) by mouth 3 (three) times daily as needed for mild constipation. 09/28/14  Yes Melina Schools, MD  gabapentin (NEURONTIN) 600 MG tablet Take 600-1,200 mg by mouth 4 (four) times daily -  before meals and at bedtime. 600mg  with meals and 1200mg  at bedtime  Yes Historical Provider, MD  morphine (MS CONTIN) 60 MG 12 hr tablet Take 60 mg by mouth every 8 (eight) hours.   Yes Historical Provider, MD  psyllium (METAMUCIL) 58.6 % packet Take 1 packet by mouth 2 (two) times daily.   Yes Historical Provider, MD  ciprofloxacin (CIPRO) 500 MG tablet Take 1 tablet (500 mg total) by mouth 2 (two) times daily. Patient not taking: Reported on 06/05/2015 05/24/15   Milton Ferguson, MD  methocarbamol (ROBAXIN) 500 MG tablet  Take 1 tablet (500 mg total) by mouth 3 (three) times daily as needed for muscle spasms. Patient not taking: Reported on 06/05/2015 09/28/14   Melina Schools, MD  ondansetron (ZOFRAN ODT) 4 MG disintegrating tablet 4mg  ODT q4 hours prn nausea/vomit Patient not taking: Reported on 06/05/2015 05/24/15   Milton Ferguson, MD  ondansetron (ZOFRAN) 4 MG tablet Take 1 tablet (4 mg total) by mouth every 8 (eight) hours as needed for nausea or vomiting. Patient not taking: Reported on 06/05/2015 09/28/14   Melina Schools, MD  oxyCODONE-acetaminophen (PERCOCET) 10-325 MG per tablet Take 1 tablet by mouth every 4 (four) hours as needed for pain. Patient not taking: Reported on 06/05/2015 09/29/14   Melina Schools, MD  polyethylene glycol powder (GLYCOLAX) powder Take 17 g by mouth daily. Patient not taking: Reported on 06/05/2015 09/28/14   Melina Schools, MD   No Known Allergies  FAMILY HISTORY:  family history includes Heart disease in his father.   SOCIAL HISTORY:  reports that he has been smoking E-cigarettes.  He has quit using smokeless tobacco. His smokeless tobacco use included Snuff. He reports that he does not drink alcohol or use illicit drugs.  REVIEW OF SYSTEMS:  Unable to complete as patient is altered. Information obtained from wife at bedside and prior medical documentation.   SUBJECTIVE: on precedex calmer  VITAL SIGNS: Temp:  [97.7 F (36.5 C)-100.4 F (38 C)] 97.7 F (36.5 C) (09/22 1230) Pulse Rate:  [53-107] 53 (09/22 1230) Resp:  [13-22] 17 (09/22 1230) BP: (155-177)/(69-105) 177/78 mmHg (09/22 1230) SpO2:  [93 %-100 %] 97 % (09/22 1230) Weight:  [193 lb 5.5 oz (87.7 kg)] 193 lb 5.5 oz (87.7 kg) (09/22 0400)  PHYSICAL EXAMINATION: General:  Adult male in NAD Neuro:  Asleep, arouses, speech clear, MAE, oriented to person, place, but not time  HEENT:  MM pink/moist, no jvd Cardiovascular:  s1s2 rrr, no m/r/g  Lungs:  resp's even/non-labored, lungs bilaterally clear, diminished bases   Abdomen:  Obese/soft, bsx4 active  Musculoskeletal:  No acute deformities  Skin:  Warm/dry, no edema   Recent Labs Lab 06/05/15 2053 06/06/15 0600 06/07/15 0410  NA 132* 130* 132*  K 5.4* 4.7 4.0  CL 101 100* 100*  CO2 23 26 24   BUN 36* 28* 10  CREATININE 2.41* 1.33* 0.69  GLUCOSE 110* 104* 103*    Recent Labs Lab 06/05/15 2053 06/06/15 0220 06/07/15 0410  HGB 8.9* 8.5* 9.1*  HCT 27.9* 26.0* 27.1*  WBC 16.7* 16.4* 16.1*  PLT 274 310 260   Ct Abdomen Pelvis Wo Contrast  06/05/2015   CLINICAL DATA:  Possible stroke. Patient is febrile, confused, and dizzy. Recent urinary retention.  EXAM: CT ABDOMEN AND PELVIS WITHOUT CONTRAST  TECHNIQUE: Multidetector CT imaging of the abdomen and pelvis was performed following the standard protocol without IV contrast.  COMPARISON:  None.  FINDINGS: Lower chest: Mild bibasilar atelectasis, right greater than left. Significant coronary artery disease. Heart size is normal.  Upper abdomen: No focal  abnormality within the liver, spleen, pancreas, or adrenal glands. Within the lower pole of the right kidney there is a hyperdense 1.9 cm lesion possibly representing a cyst but not characterized on this noncontrast exam. A 1.2 cm low-attenuation lesion is identified in the upper pole left kidney, likely representing a cyst. The gallbladder is present.  Gastrointestinal tract: The stomach and small bowel loops are normal in appearance. Large stool burden. The appendix is not seen.  Pelvis: There is significant streak artifact from right hip orthopedic hardware an electronic device in the right buttock for spinal stimulator. A Foley catheter is identified within the urinary bladder, decompressing the bladder. There is no free pelvic fluid.  Retroperitoneum: Significant atherosclerotic calcification of the abdominal aorta. No aneurysm. No retroperitoneal or mesenteric adenopathy.  Abdominal wall: Unremarkable.  Osseous structures: Right hip arthroplasty. Wedge  compression deformity of L2, L1, and T12. Significant degenerative changes throughout the spine. No suspicious lytic or blastic lesions are identified.  IMPRESSION: 1.  No evidence for acute  abnormality. 2. Significant coronary artery disease. 3. Hyperdense lesion within the lower pole of the right kidney, not characterized on this exam. Consider further evaluation with contrast-enhanced exam. MRI should be performed when the patient is clinically stable and able to follow breath holding instructions (usually best performed on an outpatient basis). 4. Large stool burden. 5. Appendix not seen. 6. Foley catheter decompresses the bladder. 7. Numerous wedge compression fractures in the lower thoracic and lumbar spine.   Electronically Signed   By: Nolon Nations M.D.   On: 06/05/2015 18:48   Dg Chest Port 1 View  06/06/2015   CLINICAL DATA:  Sepsis.  Altered mental status.  EXAM: PORTABLE CHEST - 1 VIEW  COMPARISON:  06/05/2015  FINDINGS: Dorsal column stimulator remains in place.  Left humeral prosthesis.  Atherosclerotic aortic arch. Borderline enlargement of the cardiopericardial silhouette upper zone pulmonary vascular prominence may be due to positioning.  Right mid clavicular deformity from old fracture. Degenerative right glenohumeral arthropathy.  Thoracic spondylosis. Low lung volumes are present, causing crowding of the pulmonary vasculature.  IMPRESSION: 1. Borderline enlargement of the cardiopericardial silhouette. Cephalization of blood flow could be from pulmonary venous hypertension or supine positioning. 2. Atherosclerotic aortic arch. 3. Low lung volumes.   Electronically Signed   By: Van Clines M.D.   On: 06/06/2015 10:46   Dg Chest Port 1 View  06/05/2015   CLINICAL DATA:  Sepsis  EXAM: PORTABLE CHEST - 1 VIEW  COMPARISON:  05/24/2015  FINDINGS: There is vascular congestion. Low lung volumes with bibasilar atelectasis. No effusions. Heart is upper limits normal in size. No acute bony  abnormality.  IMPRESSION: Vascular congestion with low volumes and bibasilar atelectasis.   Electronically Signed   By: Rolm Baptise M.D.   On: 06/05/2015 15:18    ASSESSMENT / PLAN:   Agitated Delirium   Discussion:  69 y/o M admitted with altered mental status.  Found to have fever, elevated WBC and AKI.  Suspect AMS is multi-factorial in the setting of possible infection (although, no source identified), medication withdrawal vs cumulative effect of narcotics / neurontin and acute renal failure in the setting of volume depletion & ACE-I.  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  Appears delirium is clearing.     Plan: Defer CT head imaging to primary SVC Continue to promote  sleep / wake cycle  Precedex gtt Home neurontin restarted at reduced dose per primary  Continue home narcotic regimen per primary, monitor for oversedation  Continue supportive measures.   PCCM will follow along with you to assist     Noe Gens, NP-C [facility] Pgr: [phone] or if no answer [phone] 06/07/2015, 3:56 PM   STAFF NOTE: I, Merrie Roof, MD FACP have personally reviewed patient's available data, including medical history, events of note, physical examination and test results as part of my evaluation. I have discussed with resident/NP and other care providers such as pharmacist, RN and RRT. In addition, I personally evaluated patient and elicited key findings of: Agitation slight on precedex 0.5, no rash, no swollen joints, neck supple, did have fever prior, WBC and continued MS changes that seem to wax and wane, LP would reasonable to perform, will rely on ID consult recommendations as yield 2 days into hospital stay on ABX may be limited at this stage, if he does not progress overnight, would reconsider LP, I have changes  his ABX to empiric acyclovir and BB coverage (vanc, ceftaz), ID may alter, consider testing for tick borne illness  / empiric dozy per ID, would have a low threshold to MRI the head and stimulator if not progressing, consider haldol if QTC is wnl and oral Risperdal if able to take this med inorder to reduce precedex use to off if able, I updated and d/w wife and ID, narcs to avoid WD, correct Na , continued saline   Lavon Paganini. Titus Mould, MD, [place] Pgr: [facility] 06/07/2015 4:50 PM

## 2015-06-07 NOTE — Progress Notes (Signed)
Noted ecchymosis to left lateral wrist. Not assessed earlier and concerned that area is result of injury from pt continuing pulling arm against restraint. Attempted to treat pt's agitation with reorientation, safety sitter, PRN medication however pt remains agitated and confused. NP on call made aware of pt condition and the questionable new area of injury. No orders received at this time. Will continue to monitor.

## 2015-06-07 NOTE — Progress Notes (Signed)
Patient ID: William Jordan, male   DOB: [birth year], 69 y.o.   MRN: [id number]         [facility] for Infectious Disease    Date of Admission:  06/05/2015           Day 2 vancomycin        Day 2 piperacillin tazobactam (just changed to ceftazidime)       Reason for Consult: Acute fever and delirium    Referring Physician: Dr. Irine Seal  Principal Problem:   Fever Active Problems:   Sepsis   Toxic metabolic encephalopathy   AKI (acute kidney injury)   Chronic pain   DDD (degenerative disc disease), lumbar   Polypharmacy   Degenerative arthritis of thoracic spine   Hyperkalemia   Constipation   . amLODipine  5 mg Oral Daily  . citalopram  20 mg Oral Daily  . gabapentin  300 mg Oral TID AC  . gabapentin  600 mg Oral QHS  . heparin  5,000 Units Subcutaneous 3 times per day  . morphine  60 mg Oral Q8H  . polyethylene glycol  17 g Oral Daily  . sodium chloride  3 mL Intravenous Q12H  . vancomycin  1,000 mg Intravenous Q8H    Recommendations: 1. Continue vancomycin and ceftazidime pending further observation   Assessment: The cause of his fever and delirium is unclear to me. His wife is not entirely sure how long he has been sick but it sounds like he may have not been feeling well for at least one month. I discussed the situation with his wife and Drs. Titus Mould. Although it is possible that he might have meningoencephalitis I do not feel that urgent lumbar puncture needs to be done tonight given the fact that he seems to be improving. He has defervescing and his mental status is improving. I agree with continuing vancomycin and ceftaz adding for now. As he wakes up I am hopeful that he may be able to give some more clues as to what is going on. He has not told his wife that he is having increased back pain or joint pain. He did have a lumbar nerve stimulator placed this past January but there is no obvious redness or swelling over the stimulator site.    HPI: William Jordan is a 69 y.o. male with multiple medical problems including severe degenerative arthritis and spine disease and chronic pain syndrome. His wife believes that he has not been feeling well for at least a month he has told her periodically that he was weak and did not want to eat. He's also stated at times it is very cold and she felt like he was having fever. Several weeks ago he developed acute urinary retention. He was seen at [facility] and underwent in and out catheterization. He was seen on 05/24/2015 in the emergency department here. That note indicates that he presented with groin pain. His wife recalls that he was just feeling extremely weak. A provisional diagnosis of prostatitis was made and he was started on ciprofloxacin and referred for urologic evaluation. His UA and urine culture at that time were negative. When he saw the urologist on 05/29/2015 his ciprofloxacin was stopped. More recently he's become increasingly confused leading to admission 2 days ago. He had temperature of 102.5 and acute kidney injury. He was started on empiric vancomycin and piperacillin tazobactam. Again urinalysis was unremarkable and urine cultures were negative. Blood cultures have been negative. No acute abnormalities  have been found on chest x-ray, abdominal CT scan and brain CT scan. His temperature curve is coming down and he is becoming more lucid.   Review of Systems: Review of systems not obtained due to patient factors.  Past Medical History  Diagnosis Date  . Hypertension   . Anxiety     takes Celexa  . Enlarged prostate   . Neuropathy   . Arthritis   . History of blood transfusion     Social History  Substance Use Topics  . Smoking status: Current Every Day Smoker    Types: E-cigarettes  . Smokeless tobacco: Former Systems developer    Types: Snuff     Comment: does not use any tobacco products any more  . Alcohol Use: No    Family History  Problem Relation Age of Onset  . Heart disease  Father    No Known Allergies  OBJECTIVE: Blood pressure 157/76, pulse 68, temperature 97.5 F (36.4 C), temperature source Core (Comment), resp. rate 22, height 6\' 5"  (1.956 m), weight 193 lb 5.5 oz (87.7 kg), SpO2 96 %. General: He is laying quietly in bed. His wife is at the bedside. His eyes are closed Skin: No rash Neck: Minimal nuchal rigidity Oral: Unable to examine Lungs: Clear Cor: Regular S1 and S2 with no murmur Abdomen: Soft without palpable masses. Not obviously tender Neuro: He will open his eyes and provide appropriate, short answers to questions Joints and extremities: Periodic myoclonic jerks of his upper extremities. No acute joint inflammation noted  Lab Results Lab Results  Component Value Date   WBC 16.1* 06/07/2015   HGB 9.1* 06/07/2015   HCT 27.1* 06/07/2015   MCV 88.9 06/07/2015   PLT 260 06/07/2015    Lab Results  Component Value Date   CREATININE 0.69 06/07/2015   BUN 10 06/07/2015   NA 132* 06/07/2015   K 4.0 06/07/2015   CL 100* 06/07/2015   CO2 24 06/07/2015    Lab Results  Component Value Date   ALT 16* 06/06/2015   AST 23 06/06/2015   ALKPHOS 72 06/06/2015   BILITOT 0.9 06/06/2015     Microbiology: Recent Results (from the past 240 hour(s))  Culture, blood (routine x 2)     Status: None (Preliminary result)   Collection Time: 06/05/15 12:50 PM  Result Value Ref Range Status   Specimen Description BLOOD LEFT FOREARM  Final   Special Requests BOTTLES DRAWN AEROBIC AND ANAEROBIC 5CC EACH  Final   Culture   Final    NO GROWTH 2 DAYS Performed at [facility]    Report Status PENDING  Incomplete  Culture, blood (routine x 2)     Status: None (Preliminary result)   Collection Time: 06/05/15  1:00 PM  Result Value Ref Range Status   Specimen Description BLOOD RIGHT HAND  Final   Special Requests BOTTLES DRAWN AEROBIC ONLY [place]  Final   Culture   Final    NO GROWTH 2 DAYS Performed at [facility]    Report Status  PENDING  Incomplete  Urine culture     Status: None   Collection Time: 06/05/15  2:15 PM  Result Value Ref Range Status   Specimen Description URINE, CATHETERIZED  Final   Special Requests NONE  Final   Culture   Final    NO GROWTH 1 DAY Performed at [facility]    Report Status 06/06/2015 FINAL  Final  MRSA PCR Screening     Status: None  Collection Time: 06/05/15 11:51 PM  Result Value Ref Range Status   MRSA by PCR NEGATIVE NEGATIVE Final    Comment:        The GeneXpert MRSA Assay (FDA approved for NASAL specimens only), is one component of a comprehensive MRSA colonization surveillance program. It is not intended to diagnose MRSA infection nor to guide or monitor treatment for MRSA infections.   Urine culture     Status: None   Collection Time: 06/06/15 11:33 AM  Result Value Ref Range Status   Specimen Description URINE, CATHETERIZED  Final   Special Requests NONE  Final   Culture   Final    NO GROWTH 1 DAY Performed at [facility]    Report Status 06/07/2015 FINAL  Final    Michel Bickers, MD [place] [facility] [phone] pager   [phone] cell 06/07/2015, 5:36 PM

## 2015-06-07 NOTE — Progress Notes (Signed)
Event note:  Notified by RN regarding persistent acute agitation. IV Ativan (made agitation worse) and Haldol (10 mg IV q6h) has been minimally effective. Agitation to point of self-injury as pt hits hands against side-rails w/ constant attempts to free himself from mittens and bil soft wrists restraints. RN has noted areas of what appear to be new contusions to bil wrists presumed result of pt fighting restraints. She reports pt has yelled and screamed out most of shift despite efforts to reorient and reassure pt. Sitter having to stand over pt to keep safe as pt quite impulsive and unpredictable. As I approached pt room I could hear pt yelling out from room. At bedside pt noted very confused and restless constantly tugging against restraints and pulling at mittens. Every few seconds will throw legs over side of bed. Apparent hallucinations as he speaks to people not present in room and seems to answer questions. Pt will suddenly raise up as if frightened. Unable to reorient pt. There does appear to be contusions to bil wrists and FA's likely caused by attempts by pt to violently fight wrists restraints and mittens. Low grade temp of 99.9, remaining VSS. There is no respiratory distress.  Assessment/Plan: 1. Acute delirium/agitation: Refractory to IV Ativan and IV Haldol. Agitation to point of self injury. Discussed pt with Dr Alva Garnet w/ [facility] who has recommended Precedex drip. Also suggested neurology consult (non-urgent) this am.  Consider trial out of restraints when adequate sedation achieved w/ Precedex. Will change pt's level of care to ICU status and continue to monitor closely.   Jeryl Columbia, NP-C [facility] Pager [phone]

## 2015-06-07 NOTE — Progress Notes (Addendum)
ANTIBIOTIC CONSULT NOTE - FOLLOW UP  Pharmacy Consult for Vancomycin, Zosyn Indication: rule out sepsis  No Known Allergies  Patient Measurements: Height: 6\' 5"  (195.6 cm) Weight: 193 lb 5.5 oz (87.7 kg) IBW/kg (Calculated) : 89.1  Vital Signs: Temp: 98.1 F (36.7 C) (09/22 1030) Temp Source: Core (Comment) (09/22 0400) BP: 168/92 mmHg (09/22 1030) Pulse Rate: 66 (09/22 1030) Intake/Output from previous day: 09/21 0701 - 09/22 0700 In: 1807.5 [P.O.:120; I.V.:1387.5; IV Piggyback:300] Out: 5462 [Urine:3340; Stool:2] Intake/Output from this shift:    Labs:  Recent Labs  06/05/15 1543  06/05/15 2053 06/06/15 0220 06/06/15 0600 06/07/15 0410  WBC  --   --  16.7* 16.4*  --  16.1*  HGB  --   --  8.9* 8.5*  --  9.1*  PLT  --   --  274 310  --  260  LABCREA 233.21  --   --   --   --   --   CREATININE  --   < > 2.41*  --  1.33* 0.69  < > = values in this interval not displayed. Estimated Creatinine Clearance: 111.1 mL/min (by C-G formula based on Cr of 0.69).  Recent Labs  06/07/15 1012  [place] 8*     Microbiology: Recent Results (from the past 720 hour(s))  Culture, blood (routine x 2)     Status: None (Preliminary result)   Collection Time: 06/05/15 12:50 PM  Result Value Ref Range Status   Specimen Description BLOOD LEFT FOREARM  Final   Special Requests BOTTLES DRAWN AEROBIC AND ANAEROBIC 5CC EACH  Final   Culture   Final    NO GROWTH < 24 HOURS Performed at [facility]    Report Status PENDING  Incomplete  Culture, blood (routine x 2)     Status: None (Preliminary result)   Collection Time: 06/05/15  1:00 PM  Result Value Ref Range Status   Specimen Description BLOOD RIGHT HAND  Final   Special Requests BOTTLES DRAWN AEROBIC ONLY [place]  Final   Culture   Final    NO GROWTH < 24 HOURS Performed at [facility]    Report Status PENDING  Incomplete  Urine culture     Status: None   Collection Time: 06/05/15  2:15 PM  Result Value Ref  Range Status   Specimen Description URINE, CATHETERIZED  Final   Special Requests NONE  Final   Culture   Final    NO GROWTH 1 DAY Performed at [facility]    Report Status 06/06/2015 FINAL  Final  MRSA PCR Screening     Status: None   Collection Time: 06/05/15 11:51 PM  Result Value Ref Range Status   MRSA by PCR NEGATIVE NEGATIVE Final    Comment:        The GeneXpert MRSA Assay (FDA approved for NASAL specimens only), is one component of a comprehensive MRSA colonization surveillance program. It is not intended to diagnose MRSA infection nor to guide or monitor treatment for MRSA infections.     Anti-infectives    Start     Dose/Rate Route Frequency Ordered Stop   06/06/15 2100  vancomycin (VANCOCIN) IVPB 1000 mg/200 mL premix  Status:  Discontinued     1,000 mg 200 mL/hr over 60 Minutes Intravenous Every 24 hours 06/05/15 2014 06/06/15 1318   06/06/15 1330  vancomycin (VANCOCIN) IVPB 1000 mg/200 mL premix     1,000 mg 200 mL/hr over 60 Minutes Intravenous Every 12  hours 06/06/15 1318     06/06/15 0400  piperacillin-tazobactam (ZOSYN) IVPB 3.375 g     3.375 g 12.5 mL/hr over 240 Minutes Intravenous Every 8 hours 06/05/15 2013     06/05/15 2000  piperacillin-tazobactam (ZOSYN) IVPB 3.375 g     3.375 g 100 mL/hr over 30 Minutes Intravenous  Once 06/05/15 1959 06/05/15 2107   06/05/15 2000  vancomycin (VANCOCIN) IVPB 1000 mg/200 mL premix     1,000 mg 200 mL/hr over 60 Minutes Intravenous  Once 06/05/15 1959 06/05/15 2222   06/05/15 1345  cefTRIAXone (ROCEPHIN) 1 g in dextrose 5 % 50 mL IVPB     1 g 100 mL/hr over 30 Minutes Intravenous  Once 06/05/15 1335 06/05/15 1449   06/05/15 1345  azithromycin (ZITHROMAX) 500 mg in dextrose 5 % 250 mL IVPB     500 mg 250 mL/hr over 60 Minutes Intravenous  Once 06/05/15 1335 06/05/15 1611     Assessment: 69 yr male with altered mental status.  Recently on ciprofloxacin for prostatitis.  Patient to be admitted with  sepsis with likely urinary source.  Pharmacy consulted to dose Vancomycin & Zosyn for sepsis.  9/20 >>Ceftriaxone x 1 >>9/20 9/20 >>Azithromycin x 1 >> 9/20 9/20 >> Vanc >> 9/20 >> Zosyn >>   9/20 blood: ngtd 9/20 urine: ngF 9/21 urine repeat: IP  Dose changes/levels: 9/22 1000 VT = 8 on 1g q12  Today, 06/07/2015: Temp: fever curve trending down, still some low grade temps overnight. WBC: elevated; sl improved Renal: AKI resolved; CrCl > 100 CG, N PCT/LA unremarkable   Goal of Therapy:  Vancomycin trough level 15-20 mcg/ml  Eradication of infection Appropriate antibiotic dosing for indication and renal function  Plan:  Day 3 antibiotics Increase vancomycin to 1000 mg IV q8 hr with improved renal function; still with leukocytosis and recent fevers Measure vancomycin trough levels at steady state as indicated Continue Zosyn as ordered  Follow clinical course, renal function, culture results as available  Follow for de-escalation of antibiotics and LOT   Reuel Boom, PharmD, BCPS Pager: [phone] 06/07/2015, 12:28 PM

## 2015-06-07 NOTE — Progress Notes (Signed)
ANTIBIOTIC CONSULT NOTE - FOLLOW UP  Pharmacy Consult for Vancomycin, Ceftazidime Indication: rule out sepsis, acute fever and delirium  No Known Allergies  Patient Measurements: Height: 6\' 5"  (195.6 cm) Weight: 193 lb 5.5 oz (87.7 kg) IBW/kg (Calculated) : 89.1  Vital Signs: Temp: 97.5 F (36.4 C) (09/22 1700) BP: 157/76 mmHg (09/22 1700) Pulse Rate: 68 (09/22 1700) Intake/Output from previous day: 09/21 0701 - 09/22 0700 In: 1807.5 [P.O.:120; I.V.:1387.5; IV Piggyback:300] Out: 9030 [Urine:3340; Stool:2] Intake/Output from this shift:    Labs:  Recent Labs  06/05/15 1543  06/05/15 2053 06/06/15 0220 06/06/15 0600 06/07/15 0410  WBC  --   --  16.7* 16.4*  --  16.1*  HGB  --   --  8.9* 8.5*  --  9.1*  PLT  --   --  274 310  --  260  LABCREA 233.21  --   --   --   --   --   CREATININE  --   < > 2.41*  --  1.33* 0.69  < > = values in this interval not displayed. Estimated Creatinine Clearance: 111.1 mL/min (by C-G formula based on Cr of 0.69).  Recent Labs  06/07/15 1012  [place] 8*     Microbiology: Recent Results (from the past 720 hour(s))  Culture, blood (routine x 2)     Status: None (Preliminary result)   Collection Time: 06/05/15 12:50 PM  Result Value Ref Range Status   Specimen Description BLOOD LEFT FOREARM  Final   Special Requests BOTTLES DRAWN AEROBIC AND ANAEROBIC 5CC EACH  Final   Culture   Final    NO GROWTH 2 DAYS Performed at [facility]    Report Status PENDING  Incomplete  Culture, blood (routine x 2)     Status: None (Preliminary result)   Collection Time: 06/05/15  1:00 PM  Result Value Ref Range Status   Specimen Description BLOOD RIGHT HAND  Final   Special Requests BOTTLES DRAWN AEROBIC ONLY [place]  Final   Culture   Final    NO GROWTH 2 DAYS Performed at [facility]    Report Status PENDING  Incomplete  Urine culture     Status: None   Collection Time: 06/05/15  2:15 PM  Result Value Ref Range Status   Specimen Description URINE, CATHETERIZED  Final   Special Requests NONE  Final   Culture   Final    NO GROWTH 1 DAY Performed at [facility]    Report Status 06/06/2015 FINAL  Final  MRSA PCR Screening     Status: None   Collection Time: 06/05/15 11:51 PM  Result Value Ref Range Status   MRSA by PCR NEGATIVE NEGATIVE Final    Comment:        The GeneXpert MRSA Assay (FDA approved for NASAL specimens only), is one component of a comprehensive MRSA colonization surveillance program. It is not intended to diagnose MRSA infection nor to guide or monitor treatment for MRSA infections.   Urine culture     Status: None   Collection Time: 06/06/15 11:33 AM  Result Value Ref Range Status   Specimen Description URINE, CATHETERIZED  Final   Special Requests NONE  Final   Culture   Final    NO GROWTH 1 DAY Performed at [facility]    Report Status 06/07/2015 FINAL  Final    Anti-infectives    Start     Dose/Rate Route Frequency Ordered Stop  06/08/15 0200  acyclovir (ZOVIRAX) 875 mg in dextrose 5 % 150 mL IVPB  Status:  Discontinued     10 mg/kg  87.7 kg 167.5 mL/hr over 60 Minutes Intravenous Every 8 hours 06/07/15 1736 06/07/15 1740   06/08/15 0200  cefTAZidime (FORTAZ) 2 g in dextrose 5 % 50 mL IVPB     2 g 100 mL/hr over 30 Minutes Intravenous Every 8 hours 06/07/15 1736     06/07/15 1800  vancomycin (VANCOCIN) IVPB 1000 mg/200 mL premix     1,000 mg 200 mL/hr over 60 Minutes Intravenous Every 8 hours 06/07/15 1726     06/07/15 1800  acyclovir (ZOVIRAX) 875 mg in dextrose 5 % 150 mL IVPB  Status:  Discontinued     10 mg/kg  87.7 kg 167.5 mL/hr over 60 Minutes Intravenous NOW 06/07/15 1736 06/07/15 1740   06/07/15 1800  cefTAZidime (FORTAZ) 2 g in dextrose 5 % 50 mL IVPB     2 g 100 mL/hr over 30 Minutes Intravenous NOW 06/07/15 1736 06/08/15 1800   06/07/15 1300  vancomycin (VANCOCIN) IVPB 1000 mg/200 mL premix  Status:  Discontinued     1,000 mg 200  mL/hr over 60 Minutes Intravenous 3 times per day 06/07/15 1242 06/07/15 1511   06/06/15 2100  vancomycin (VANCOCIN) IVPB 1000 mg/200 mL premix  Status:  Discontinued     1,000 mg 200 mL/hr over 60 Minutes Intravenous Every 24 hours 06/05/15 2014 06/06/15 1318   06/06/15 1330  vancomycin (VANCOCIN) IVPB 1000 mg/200 mL premix  Status:  Discontinued     1,000 mg 200 mL/hr over 60 Minutes Intravenous Every 12 hours 06/06/15 1318 06/07/15 1242   06/06/15 0400  piperacillin-tazobactam (ZOSYN) IVPB 3.375 g  Status:  Discontinued     3.375 g 12.5 mL/hr over 240 Minutes Intravenous Every 8 hours 06/05/15 2013 06/07/15 1658   06/05/15 2000  piperacillin-tazobactam (ZOSYN) IVPB 3.375 g     3.375 g 100 mL/hr over 30 Minutes Intravenous  Once 06/05/15 1959 06/05/15 2107   06/05/15 2000  vancomycin (VANCOCIN) IVPB 1000 mg/200 mL premix     1,000 mg 200 mL/hr over 60 Minutes Intravenous  Once 06/05/15 1959 06/05/15 2222   06/05/15 1345  cefTRIAXone (ROCEPHIN) 1 g in dextrose 5 % 50 mL IVPB     1 g 100 mL/hr over 30 Minutes Intravenous  Once 06/05/15 1335 06/05/15 1449   06/05/15 1345  azithromycin (ZITHROMAX) 500 mg in dextrose 5 % 250 mL IVPB     500 mg 250 mL/hr over 60 Minutes Intravenous  Once 06/05/15 1335 06/05/15 1611     Assessment: 69 yr male with altered mental status.  Recently on ciprofloxacin for prostatitis.  Patient admitted with sepsis with likely urinary source and started on Vancomycin & Zosyn.   ID consulted and antibiotics changed to Vancomycin and Ceftazidime.  Cause of fever and delirium unclear but pt is defervescing and mental status improving.  CCM notes possible meningoencephalitis.  Acyclovir initially added then discontinued - no doses given.   9/20 >>Ceftriaxone x 1 >>9/20 9/20 >>Azithromycin x 1 >> 9/20 9/20 >> Vanc >> 9/20 >> Zosyn >>9/22 9/22 >> Ceftazidime >>   9/20 blood: ngtd 9/20 urine: ngF 9/21 urine repeat: IP  Dose changes/levels: 9/22 1000 VT = 8 on 1g  q12  Today, 06/07/2015: Temp: fever curve trending down, still some low grade temps overnight. WBC: elevated; sl improved Renal: AKI resolved; CrCl > 100 CG, N PCT/LA unremarkable  Goal of Therapy:  Vancomycin trough level 15-20 mcg/ml  Eradication of infection Appropriate antibiotic dosing for indication and renal function  Plan:  Day 3 antibiotics  Restart Vancomycin 1g IV q8h based on earlier trough level  Start Ceftazidime 2g IV q8h Measure vancomycin trough levels at steady state as indicated  Follow clinical course, renal function, culture results as available  Ralene Bathe, PharmD, BCPS 06/07/2015, 5:53 PM  Pager: [phone]

## 2015-06-08 ENCOUNTER — Inpatient Hospital Stay (HOSPITAL_COMMUNITY): Payer: Medicare Other

## 2015-06-08 DIAGNOSIS — G9340 Encephalopathy, unspecified: Secondary | ICD-10-CM | POA: Diagnosis present

## 2015-06-08 DIAGNOSIS — R509 Fever, unspecified: Secondary | ICD-10-CM | POA: Insufficient documentation

## 2015-06-08 DIAGNOSIS — M5136 Other intervertebral disc degeneration, lumbar region: Secondary | ICD-10-CM

## 2015-06-08 DIAGNOSIS — N179 Acute kidney failure, unspecified: Secondary | ICD-10-CM

## 2015-06-08 LAB — BASIC METABOLIC PANEL
Anion gap: 8 (ref 5–15)
BUN: 9 mg/dL (ref 6–20)
CHLORIDE: 100 mmol/L — AB (ref 101–111)
CO2: 24 mmol/L (ref 22–32)
Calcium: 7.8 mg/dL — ABNORMAL LOW (ref 8.9–10.3)
Creatinine, Ser: 0.67 mg/dL (ref 0.61–1.24)
GFR calc Af Amer: >60 mL/min (ref >60)
GFR calc non Af Amer: >60 mL/min (ref >60)
Glucose, Bld: 116 mg/dL — ABNORMAL HIGH (ref 65–99)
POTASSIUM: 3.4 mmol/L — AB (ref 3.5–5.1)
SODIUM: 132 mmol/L — AB (ref 135–145)

## 2015-06-08 LAB — CBC WITH DIFFERENTIAL/PLATELET
Basophils Absolute: 0 10*3/uL (ref 0.0–0.1)
Basophils Relative: 0 %
EOS ABS: 0.3 10*3/uL (ref 0.0–0.7)
Eosinophils Relative: 3 %
HEMATOCRIT: 27.5 % — AB (ref 39.0–52.0)
HEMOGLOBIN: 9.2 g/dL — AB (ref 13.0–17.0)
LYMPHS ABS: 1.6 10*3/uL (ref 0.7–4.0)
LYMPHS PCT: 12 %
MCH: 29.2 pg (ref 26.0–34.0)
MCHC: 33.5 g/dL (ref 30.0–36.0)
MCV: 87.3 fL (ref 78.0–100.0)
Monocytes Absolute: 1.2 10*3/uL — ABNORMAL HIGH (ref 0.1–1.0)
Monocytes Relative: 9 %
NEUTROS ABS: 10.2 10*3/uL — AB (ref 1.7–7.7)
NEUTROS PCT: 76 %
Platelets: 274 10*3/uL (ref 150–400)
RBC: 3.15 MIL/uL — AB (ref 4.22–5.81)
RDW: 13.6 % (ref 11.5–15.5)
WBC: 13.3 10*3/uL — AB (ref 4.0–10.5)

## 2015-06-08 LAB — MAGNESIUM: MAGNESIUM: 1.7 mg/dL (ref 1.7–2.4)

## 2015-06-08 MED ORDER — AMLODIPINE BESYLATE 5 MG PO TABS
5.0000 mg | ORAL_TABLET | Freq: Once | ORAL | Status: AC
  Administered 2015-06-08: 5 mg via ORAL
  Filled 2015-06-08: qty 1

## 2015-06-08 MED ORDER — IOHEXOL 300 MG/ML  SOLN
100.0000 mL | Freq: Once | INTRAMUSCULAR | Status: AC | PRN
  Administered 2015-06-08: 100 mL via INTRAVENOUS

## 2015-06-08 MED ORDER — POTASSIUM CHLORIDE CRYS ER 20 MEQ PO TBCR
40.0000 meq | EXTENDED_RELEASE_TABLET | Freq: Once | ORAL | Status: AC
  Administered 2015-06-08: 40 meq via ORAL
  Filled 2015-06-08: qty 2

## 2015-06-08 MED ORDER — POLYETHYLENE GLYCOL 3350 17 G PO PACK: 17.0000 g | PACK | Freq: Two times a day (BID) | ORAL | Status: DC

## 2015-06-08 MED ORDER — AMLODIPINE BESYLATE 10 MG PO TABS
10.0000 mg | ORAL_TABLET | Freq: Every day | ORAL | Status: DC
  Administered 2015-06-09 – 2015-06-12 (×4): 10 mg via ORAL
  Filled 2015-06-08 (×4): qty 1

## 2015-06-08 MED ORDER — HYDRALAZINE HCL 20 MG/ML IJ SOLN
10.0000 mg | Freq: Four times a day (QID) | INTRAMUSCULAR | Status: DC | PRN
  Administered 2015-06-08 – 2015-06-10 (×3): 10 mg via INTRAVENOUS
  Filled 2015-06-08 (×3): qty 1

## 2015-06-08 MED ORDER — MAGNESIUM SULFATE 4 GM/100ML IV SOLN
4.0000 g | Freq: Once | INTRAVENOUS | Status: AC
  Administered 2015-06-08: 4 g via INTRAVENOUS
  Filled 2015-06-08: qty 100

## 2015-06-08 MED ORDER — IOHEXOL 300 MG/ML  SOLN
50.0000 mL | Freq: Once | INTRAMUSCULAR | Status: AC | PRN
  Administered 2015-06-08: 50 mL via ORAL

## 2015-06-08 MED ORDER — SENNOSIDES-DOCUSATE SODIUM 8.6-50 MG PO TABS: 1.0000 | ORAL_TABLET | Freq: Every day | ORAL | Status: DC

## 2015-06-08 NOTE — Progress Notes (Signed)
Pt is having frequent small loose dark green stools with mucus. He is having some abd pain in the last 30-45 min. aching, 5/10. Pt currently drinking contrast and will be getting a CT of the abdomen in an hour. MD notified.

## 2015-06-08 NOTE — Progress Notes (Signed)
[facility] for Infectious Disease    Subjective: No new complaints   Antibiotics:  Anti-infectives    Start     Dose/Rate Route Frequency Ordered Stop   06/08/15 0200  acyclovir (ZOVIRAX) 875 mg in dextrose 5 % 150 mL IVPB  Status:  Discontinued     10 mg/kg  87.7 kg 167.5 mL/hr over 60 Minutes Intravenous Every 8 hours 06/07/15 1736 06/07/15 1740   06/08/15 0200  cefTAZidime (FORTAZ) 2 g in dextrose 5 % 50 mL IVPB     2 g 100 mL/hr over 30 Minutes Intravenous Every 8 hours 06/07/15 1736     06/07/15 1800  vancomycin (VANCOCIN) IVPB 1000 mg/200 mL premix     1,000 mg 200 mL/hr over 60 Minutes Intravenous Every 8 hours 06/07/15 1726     06/07/15 1800  acyclovir (ZOVIRAX) 875 mg in dextrose 5 % 150 mL IVPB  Status:  Discontinued     10 mg/kg  87.7 kg 167.5 mL/hr over 60 Minutes Intravenous NOW 06/07/15 1736 06/07/15 1740   06/07/15 1800  cefTAZidime (FORTAZ) 2 g in dextrose 5 % 50 mL IVPB     2 g 100 mL/hr over 30 Minutes Intravenous NOW 06/07/15 1736 06/07/15 1837   06/07/15 1300  vancomycin (VANCOCIN) IVPB 1000 mg/200 mL premix  Status:  Discontinued     1,000 mg 200 mL/hr over 60 Minutes Intravenous 3 times per day 06/07/15 1242 06/07/15 1511   06/06/15 2100  vancomycin (VANCOCIN) IVPB 1000 mg/200 mL premix  Status:  Discontinued     1,000 mg 200 mL/hr over 60 Minutes Intravenous Every 24 hours 06/05/15 2014 06/06/15 1318   06/06/15 1330  vancomycin (VANCOCIN) IVPB 1000 mg/200 mL premix  Status:  Discontinued     1,000 mg 200 mL/hr over 60 Minutes Intravenous Every 12 hours 06/06/15 1318 06/07/15 1242   06/06/15 0400  piperacillin-tazobactam (ZOSYN) IVPB 3.375 g  Status:  Discontinued     3.375 g 12.5 mL/hr over 240 Minutes Intravenous Every 8 hours 06/05/15 2013 06/07/15 1658   06/05/15 2000  piperacillin-tazobactam (ZOSYN) IVPB 3.375 g     3.375 g 100 mL/hr over 30 Minutes Intravenous  Once 06/05/15 1959 06/05/15 2107   06/05/15 2000   vancomycin (VANCOCIN) IVPB 1000 mg/200 mL premix     1,000 mg 200 mL/hr over 60 Minutes Intravenous  Once 06/05/15 1959 06/05/15 2222   06/05/15 1345  cefTRIAXone (ROCEPHIN) 1 g in dextrose 5 % 50 mL IVPB     1 g 100 mL/hr over 30 Minutes Intravenous  Once 06/05/15 1335 06/05/15 1449   06/05/15 1345  azithromycin (ZITHROMAX) 500 mg in dextrose 5 % 250 mL IVPB     500 mg 250 mL/hr over 60 Minutes Intravenous  Once 06/05/15 1335 06/05/15 1611      Medications: Scheduled Meds: . amLODipine  5 mg Oral Daily  . cefTAZidime (FORTAZ)  IV  2 g Intravenous Q8H  . citalopram  20 mg Oral Daily  . gabapentin  300 mg Oral TID AC  . gabapentin  600 mg Oral QHS  . heparin  5,000 Units Subcutaneous 3 times per day  . morphine  60 mg Oral Q8H  . polyethylene glycol  17 g Oral BID  . senna-docusate  1 tablet Oral QHS  . sodium chloride  3 mL Intravenous Q12H  . vancomycin  1,000 mg Intravenous Q8H   Continuous Infusions: . sodium chloride 75  mL/hr at 06/08/15 1152   PRN Meds:.acetaminophen **OR** acetaminophen, alum & mag hydroxide-simeth, haloperidol lactate, morphine injection, ondansetron **OR** ondansetron (ZOFRAN) IV, sodium polystyrene    Objective: Weight change:   Intake/Output Summary (Last 24 hours) at 06/08/15 1353 Last data filed at 06/08/15 1037  Gross per 24 hour  Intake 2939.82 ml  Output   2610 ml  Net 329.82 ml   Blood pressure 172/81, pulse 81, temperature 97.9 F (36.6 C), temperature source Core (Comment), resp. rate 21, height 6\' 5"  (1.956 m), weight 193 lb 5.5 oz (87.7 kg), SpO2 97 %. Temp:  [97.5 F (36.4 C)-100.2 F (37.9 C)] 97.9 F (36.6 C) (09/23 1200) Pulse Rate:  [53-82] 81 (09/23 1200) Resp:  [15-23] 21 (09/23 1200) BP: (139-188)/(67-130) 172/81 mmHg (09/23 1200) SpO2:  [91 %-100 %] 97 % (09/23 1200)  Physical Exam: General: Alert and awake, oriented x3, not in any acute distress. HEENT: anicteric sclera,  EOMI Cardiovascular: regular rate, normal  r,  no murmur rubs or gallops Pulmonary: clear to auscultation bilaterally, no wheezing, rales or rhonchi Gastrointestinal:  soft nontender, nondistended, normal bowel sounds, Musculoskeletal:  no  clubbing or edema noted bilaterally Skin: no rashes Lymph: no new lymphadenopathy Neuro: nonfocal  CBC: CBC Latest Ref Rng 06/08/2015 06/07/2015 06/06/2015  WBC 4.0 - 10.5 K/uL 13.3(H) 16.1(H) 16.4(H)  Hemoglobin 13.0 - 17.0 g/dL 9.2(L) 9.1(L) 8.5(L)  Hematocrit 39.0 - 52.0 % 27.5(L) 27.1(L) 26.0(L)  Platelets 150 - 400 K/uL 274 260 310       BMET  Recent Labs  06/07/15 0410 06/08/15 0347  NA 132* 132*  K 4.0 3.4*  CL 100* 100*  CO2 24 24  GLUCOSE 103* 116*  BUN 10 9  CREATININE 0.69 0.67  CALCIUM 8.1* 7.8*     Liver Panel   Recent Labs  06/06/15 0600  PROT 5.9*  ALBUMIN 3.1*  AST 23  ALT 16*  ALKPHOS 72  BILITOT 0.9       Sedimentation Rate No results for input(s): ESRSEDRATE in the last 72 hours. C-Reactive Protein No results for input(s): CRP in the last 72 hours.  Micro Results: Recent Results (from the past 720 hour(s))  Culture, blood (routine x 2)     Status: None (Preliminary result)   Collection Time: 06/05/15 12:50 PM  Result Value Ref Range Status   Specimen Description BLOOD LEFT FOREARM  Final   Special Requests BOTTLES DRAWN AEROBIC AND ANAEROBIC 5CC EACH  Final   Culture   Final    NO GROWTH 2 DAYS Performed at [facility]    Report Status PENDING  Incomplete  Culture, blood (routine x 2)     Status: None (Preliminary result)   Collection Time: 06/05/15  1:00 PM  Result Value Ref Range Status   Specimen Description BLOOD RIGHT HAND  Final   Special Requests BOTTLES DRAWN AEROBIC ONLY [place]  Final   Culture   Final    NO GROWTH 2 DAYS Performed at [facility]    Report Status PENDING  Incomplete  Urine culture     Status: None   Collection Time: 06/05/15  2:15 PM  Result Value Ref Range Status   Specimen Description  URINE, CATHETERIZED  Final   Special Requests NONE  Final   Culture   Final    NO GROWTH 1 DAY Performed at [facility]    Report Status 06/06/2015 FINAL  Final  MRSA PCR Screening     Status: None  Collection Time: 06/05/15 11:51 PM  Result Value Ref Range Status   MRSA by PCR NEGATIVE NEGATIVE Final    Comment:        The GeneXpert MRSA Assay (FDA approved for NASAL specimens only), is one component of a comprehensive MRSA colonization surveillance program. It is not intended to diagnose MRSA infection nor to guide or monitor treatment for MRSA infections.   Urine culture     Status: None   Collection Time: 06/06/15 11:33 AM  Result Value Ref Range Status   Specimen Description URINE, CATHETERIZED  Final   Special Requests NONE  Final   Culture   Final    NO GROWTH 1 DAY Performed at [facility]    Report Status 06/07/2015 FINAL  Final    Studies/Results: Ct Head Wo Contrast  06/07/2015   CLINICAL DATA:  Encephalopyosis  EXAM: CT HEAD WITHOUT CONTRAST  TECHNIQUE: Contiguous axial images were obtained from the base of the skull through the vertex without intravenous contrast.  COMPARISON:  None.  FINDINGS: Ventricles and sulci are appropriate for patient's age. No evidence for acute cortically based infarct, intracranial hemorrhage, mass lesion or mass-effect. The orbits are unremarkable. Paranasal sinuses are unremarkable. Mastoid air cells are well aerated. Calvarium is intact.  IMPRESSION: No acute intracranial process.   Electronically Signed   By: Lovey Newcomer M.D.   On: 06/07/2015 15:44      Assessment/Plan:  INTERVAL HISTORY:   06/07/15:  Continues to improve Neurologically   Principal Problem:   Fever Active Problems:   Chronic pain   DDD (degenerative disc disease), lumbar   Polypharmacy   Degenerative arthritis of thoracic spine   Sepsis   Toxic metabolic encephalopathy   Hyperkalemia   Constipation   Acute renal failure  syndrome   Acute encephalopathy    William Jordan is a 69 y.o. male with admission with fever and encephalopathy now better on vancomycin and ceftazidime. He has history of feeling poorly for at least a month with episodes of freezing and then feeling hot but no measured temperature. He had an episode of urinary retention rx at [facility], then episode of weakness,  Confusion seen in our ED and given cipro for possible prostatitis. He actually IMPROVED while on the cipro but then this was stopped after he was seen by [facility] who did not feel he was suffering from prostatitis on the 13th. By the 20th he had recurrence of weakness, profound confusion and high fevers. He was admitted and treated with broad spectrum abx, first vancomycin , zosyn and now ceftazidime and vancomycin  He NEVER had HA that was of much significance along the way and I doubt he has meningoencephalitis.  I AM concerned that he has some more significant underlying infection that has been causing his ssx of at least a month and which responded to the ciprofloxacin  I will get repeat CT abdomen and pelvis WITH contrast to re-evaluate this area along with CT chest with contrast.  He has a nerve stimulator in his spine that he cannot have turned on while in an MRI but apparently he can turn it off and have an MRI. His wife is offering to retrieve the controller for this at home.  If the above imaging is unrevealing I would consider CT of head with contrast vs MRI with contrast IF he can have the stimulator turned off.   He has mx compression fracture in spine, perhaps these will be better elucidated  by repeat CT scan being done.  Dr. [name] is covering this weekend.     LOS: 3 days   William Jordan 06/08/2015, 1:53 PM

## 2015-06-08 NOTE — Progress Notes (Signed)
[facility] PCCM  S: William Jordan is feeling better today.  No headache or neck stiffness.  Hungry, wants to eat. O:  Filed Vitals:   06/08/15 0500 06/08/15 0600 06/08/15 0700 06/08/15 0800  BP: 171/83 163/78 164/76 147/80  Pulse: 62 58 56 54  Temp: 98.8 F (37.1 C) 98.4 F (36.9 C) 98.4 F (36.9 C) 98.2 F (36.8 C)  TempSrc:      Resp: 21 18 18 15   Height:      Weight:      SpO2: 92% 94% 94% 92%   Gen: well appearing HENT: OP clear, NCAT, neck supple PULM: CTA B, normal percussion CV: RRR, no mgr, trace edema GI: BS+, soft, nontender Derm: no cyanosis or rash Psyche: normal mood and affect Neuro: Awake, alert, oriented to time, date, year, location, situation  CBC    Component Value Date/Time   WBC 13.3* 06/08/2015 0347   RBC 3.15* 06/08/2015 0347   HGB 9.2* 06/08/2015 0347   HCT 27.5* 06/08/2015 0347   PLT 274 06/08/2015 0347   MCV 87.3 06/08/2015 0347   MCH 29.2 06/08/2015 0347   MCHC 33.5 06/08/2015 0347   RDW 13.6 06/08/2015 0347   LYMPHSABS 1.6 06/08/2015 0347   MONOABS 1.2* 06/08/2015 0347   EOSABS 0.3 06/08/2015 0347   BASOSABS 0.0 06/08/2015 0347    BMET    Component Value Date/Time   NA 132* 06/08/2015 0347   K 3.4* 06/08/2015 0347   CL 100* 06/08/2015 0347   CO2 24 06/08/2015 0347   GLUCOSE 116* 06/08/2015 0347   BUN 9 06/08/2015 0347   CREATININE 0.67 06/08/2015 0347   CALCIUM 7.8* 06/08/2015 0347   GFRNONAA >60 06/08/2015 0347   GFRAA >60 06/08/2015 0347   Impression: Principal Problem:   Fever Active Problems:   Chronic pain   DDD (degenerative disc disease), lumbar   Polypharmacy   Degenerative arthritis of thoracic spine   Sepsis   Toxic metabolic encephalopathy   Hyperkalemia   Constipation   Acute renal failure syndrome  Discussion: Acute encephalopathy has improved significantly as he is fully oriented and feels well this morning.  There are many reasons why he could have been encephalopathic: UTI, Cipro, AKI complicated by multiple  sedating medications that he takes at baseline.  He does not have nuchal rigidity or headache to suggest meningitis.    Plan: Discontinue Precedex Advance diet Careful adding back neuro-active medications If encephalopathy recurs, would consider neurology consult  PCCM will sign off  Roselie Awkward, MD [place] [facility] Pager: [phone] Cell: [phone] After 3pm or if no response, call [phone]

## 2015-06-08 NOTE — Progress Notes (Signed)
[facility] PROGRESS NOTE  William Jordan TTS:[id number] DOB: Apr 22, 1946 DOA: 06/05/2015 PCP: [name], MD  Assessment/Plan: #1 sepsis Patient presenting with a fever with a temp of 101.4, heart rate of 103, white count of 17.5 and in acute renal failure with a creatinine of 3.8 BUN of 42 with confusion and also noted to be borderline hypotensive. Source of infection unknown. Chest x-ray was negative for any acute infiltrate. Urinalysis was negative. Blood cultures pending. Urine cultures pending.  Repeat chest x-ray was negative. Repeat urinalysis with small leukocytes negative nitrites. Urine cultures pending.  Patient with improvement with fever and trending down of his fever curve. Leukocytosis trending down. Patient was seen in consultation by ID and IV Zosyn was changed to IV [place]. Continue empiric IV vancomycin. ID following and appreciate input and recommendations.   #2 acute renal failure Questionable etiology. May be secondary to a prerenal azotemia in the setting of ACE inhibitor and possible postobstructive nephropathy as patient does have a history of BPH. CT abdomen and pelvis was negative for hydronephrosis. Foley catheter in place.  Patient with good urine output and had 2.035 liters urine output yesterday. Renal function improved. Creatinine on admission was 3.8 from 0.78 on 05/24/2015. Creatinine today is  0.67. Follow. Nephrology following and appreciate input and recommendations. Continue gentle hydration until tolerating good oral intake. Would not resume ACE inhibitor.  #3 hyperkalemia/ hypomagnesemia Patient is status post calcium gluconate, insulin and D50, Kayexalate with resolution of hyperkalemia. ACE inhibitor on hold. Resolved. Replete magnesium and keep greater than 2.  #4 urinary retention May be secondary to BPH. Patient status post Foley catheter placement. Repeat UA with cultures and sensitivities pending. Patient on empiric IV vancomycin and Fortaz.  #5  constipation Per CT abdomen and pelvis.  Patient status post soapsuds enema with good bowel movement yesterday. Will change MiraLAX to twice a day and placed on Senokot daily at bedtime.   #6 acute encephalopathy Patient with clinical improvement. May be secondary to  Withdrawal from MS Contin and Neurontin as patient's wife stated that patient suddenly stopped taking all his medications 1-2 weeks prior to admission.  Also on the differential includes sepsis versus secondary to medication induced secondary to Neurontin and pain medications in the setting of acute renal failure. Patient very agitated 2 days ago and delirious and confused and had to be placed in soft restraints. Patient with no improvement with soft restraints and was placed on Ativan as well as Haldol with no significant improvement with his agitation and delirium. Repeat chest x-ray negative for any acute infiltrate. Repeat urinalysis  With small leukocytes nitrite negative. Urine cultures pending. Patient was subsequently placed on the Precedex drip and critical care followed the patient during Precedex drip as been titrated down. CT head negative. Patient with significant clinical improvement alert oriented to self and place. Patient following commands. Patient is not aggressive or agitated. Patient states he's feeling thirsty. Continue empiric IV antibiotics and IV fluids. PCCM and ID ff.   #7 hypertension  Continue IV fluids. Continue Norvasc.  #8 chronic pain Continue home regimen of long-acting MS Contin of 60 mg every 8 hours. Resumed neurontin at 1/2 home dose.    #9 leukocytosis  questionable etiology. Patient has been pancultured and results are pending. Patient still with fever curve trending down. Patient was seen in consultation by infectious diseases and continued on IV vancomycin. IV Zosyn has been changed to IV [place]. WBC trending down. ID following and appreciate their recommendations.  #10  prophylaxis Heparin for  DVT prophylaxis.   Code Status: Full Family Communication: Updated patient no family at bedside. Disposition Plan: Remain in the step down unit.   Consultants:  Nephrology: Dr. Jonnie Finner 06/05/2015  Infectious diseases: Dr. Megan Salon 06/07/2015  PCCM: Dr. Titus Mould  06/07/2015   Procedures:  CT abdomen and pelvis 06/05/2015  Chest x-ray 06/05/2015, 06/06/2015  CT head 06/07/2015  Antibiotics:  IV Rocephin 06/05/2015>>> 06/05/2015  IV azithromycin 06/05/2015>>>>06/05/2015  IV Zosyn 06/05/2015>>>>> 06/07/2015  IV vancomycin 06/05/2015  IV Tressie Ellis 06/07/2015  HPI/Subjective: Patient in bed alert oriented to self and place. Thinks his 2017. Knows it September. Patient following commands and answering questions appropriately. Not agitated.  Objective: Filed Vitals:   06/08/15 0800  BP: 147/80  Pulse: 54  Temp: 98.2 F (36.8 C)  Resp: 15    Intake/Output Summary (Last 24 hours) at 06/08/15 1005 Last data filed at 06/08/15 0836  Gross per 24 hour  Intake 2939.82 ml  Output   2335 ml  Net 604.82 ml   Filed Weights   06/05/15 2252 06/06/15 0500 06/07/15 0400  Weight: 90.1 kg (198 lb 10.2 oz) 88.7 kg (195 lb 8.8 oz) 87.7 kg (193 lb 5.5 oz)    Exam:   General:  NAD  Cardiovascular: RRR  Respiratory: CTAB anterior lung fields  Abdomen: Soft, nontender, nondistended, positive bowel sounds.   Musculoskeletal: No clubbing cyanosis or edema.  Data Reviewed: Basic Metabolic Panel:  Recent Labs Lab 06/05/15 1806 06/05/15 2053 06/06/15 0600 06/07/15 0410 06/08/15 0347  NA 133* 132* 130* 132* 132*  K 5.1 5.4* 4.7 4.0 3.4*  CL 102 101 100* 100* 100*  CO2 25 23 26 24 24   GLUCOSE 39* 110* 104* 103* 116*  BUN 40* 36* 28* 10 9  CREATININE 2.87* 2.41* 1.33* 0.69 0.67  CALCIUM 8.0* 7.9* 7.6* 8.1* 7.8*  MG  --   --   --  1.5* 1.7   Liver Function Tests:  Recent Labs Lab 06/05/15 1323 06/06/15 0600  AST 26 23  ALT 16* 16*  ALKPHOS 90 72  BILITOT 0.4  0.9  PROT 6.7 5.9*  ALBUMIN 3.8 3.1*   No results for input(s): LIPASE, AMYLASE in the last 168 hours. No results for input(s): AMMONIA in the last 168 hours. CBC:  Recent Labs Lab 06/05/15 1323 06/05/15 2053 06/06/15 0220 06/07/15 0410 06/08/15 0347  WBC 17.5* 16.7* 16.4* 16.1* 13.3*  NEUTROABS 14.5* 13.8*  --  12.9* 10.2*  HGB 9.7* 8.9* 8.5* 9.1* 9.2*  HCT 29.4* 27.9* 26.0* 27.1* 27.5*  MCV 89.9 89.7 90.9 88.9 87.3  PLT 346 274 310 260 274   Cardiac Enzymes: No results for input(s): CKTOTAL, CKMB, CKMBINDEX, TROPONINI in the last 168 hours. BNP (last 3 results) No results for input(s): BNP in the last 8760 hours.  ProBNP (last 3 results) No results for input(s): PROBNP in the last 8760 hours.  CBG:  Recent Labs Lab 06/05/15 1912  GLUCAP 59*    Recent Results (from the past 240 hour(s))  Culture, blood (routine x 2)     Status: None (Preliminary result)   Collection Time: 06/05/15 12:50 PM  Result Value Ref Range Status   Specimen Description BLOOD LEFT FOREARM  Final   Special Requests BOTTLES DRAWN AEROBIC AND ANAEROBIC 5CC EACH  Final   Culture   Final    NO GROWTH 2 DAYS Performed at [facility]    Report Status PENDING  Incomplete  Culture, blood (routine x 2)  Status: None (Preliminary result)   Collection Time: 06/05/15  1:00 PM  Result Value Ref Range Status   Specimen Description BLOOD RIGHT HAND  Final   Special Requests BOTTLES DRAWN AEROBIC ONLY [place]  Final   Culture   Final    NO GROWTH 2 DAYS Performed at [facility]    Report Status PENDING  Incomplete  Urine culture     Status: None   Collection Time: 06/05/15  2:15 PM  Result Value Ref Range Status   Specimen Description URINE, CATHETERIZED  Final   Special Requests NONE  Final   Culture   Final    NO GROWTH 1 DAY Performed at [facility]    Report Status 06/06/2015 FINAL  Final  MRSA PCR Screening     Status: None   Collection Time: 06/05/15 11:51 PM   Result Value Ref Range Status   MRSA by PCR NEGATIVE NEGATIVE Final    Comment:        The GeneXpert MRSA Assay (FDA approved for NASAL specimens only), is one component of a comprehensive MRSA colonization surveillance program. It is not intended to diagnose MRSA infection nor to guide or monitor treatment for MRSA infections.   Urine culture     Status: None   Collection Time: 06/06/15 11:33 AM  Result Value Ref Range Status   Specimen Description URINE, CATHETERIZED  Final   Special Requests NONE  Final   Culture   Final    NO GROWTH 1 DAY Performed at [facility]    Report Status 06/07/2015 FINAL  Final     Studies: Ct Head Wo Contrast  06/07/2015   CLINICAL DATA:  Encephalopyosis  EXAM: CT HEAD WITHOUT CONTRAST  TECHNIQUE: Contiguous axial images were obtained from the base of the skull through the vertex without intravenous contrast.  COMPARISON:  None.  FINDINGS: Ventricles and sulci are appropriate for patient's age. No evidence for acute cortically based infarct, intracranial hemorrhage, mass lesion or mass-effect. The orbits are unremarkable. Paranasal sinuses are unremarkable. Mastoid air cells are well aerated. Calvarium is intact.  IMPRESSION: No acute intracranial process.   Electronically Signed   By: Lovey Newcomer M.D.   On: 06/07/2015 15:44   Dg Chest Port 1 View  06/06/2015   CLINICAL DATA:  Sepsis.  Altered mental status.  EXAM: PORTABLE CHEST - 1 VIEW  COMPARISON:  06/05/2015  FINDINGS: Dorsal column stimulator remains in place.  Left humeral prosthesis.  Atherosclerotic aortic arch. Borderline enlargement of the cardiopericardial silhouette upper zone pulmonary vascular prominence may be due to positioning.  Right mid clavicular deformity from old fracture. Degenerative right glenohumeral arthropathy.  Thoracic spondylosis. Low lung volumes are present, causing crowding of the pulmonary vasculature.  IMPRESSION: 1. Borderline enlargement of the  cardiopericardial silhouette. Cephalization of blood flow could be from pulmonary venous hypertension or supine positioning. 2. Atherosclerotic aortic arch. 3. Low lung volumes.   Electronically Signed   By: Van Clines M.D.   On: 06/06/2015 10:46    Scheduled Meds: . amLODipine  5 mg Oral Daily  . cefTAZidime (FORTAZ)  IV  2 g Intravenous Q8H  . citalopram  20 mg Oral Daily  . gabapentin  300 mg Oral TID AC  . gabapentin  600 mg Oral QHS  . heparin  5,000 Units Subcutaneous 3 times per day  . morphine  60 mg Oral Q8H  . polyethylene glycol  17 g Oral Daily  . sodium chloride  3 mL  Intravenous Q12H  . vancomycin  1,000 mg Intravenous Q8H   Continuous Infusions: . sodium chloride 75 mL/hr at 06/08/15 0800  . dexmedetomidine 0.502 mcg/kg/hr (06/08/15 0800)    Principal Problem:   Fever Active Problems:   Chronic pain   DDD (degenerative disc disease), lumbar   Polypharmacy   Degenerative arthritis of thoracic spine   Sepsis   Toxic metabolic encephalopathy   AKI (acute kidney injury)   Hyperkalemia   Constipation    Time spent: 82 mins    [facility] MD [facility] Pager [phone]. If 7PM-7AM, please contact night-coverage at [email or web address], password [facility] 06/08/2015, 10:05 AM  LOS: 3 days

## 2015-06-09 ENCOUNTER — Inpatient Hospital Stay (HOSPITAL_COMMUNITY): Payer: Medicare Other

## 2015-06-09 DIAGNOSIS — E876 Hypokalemia: Secondary | ICD-10-CM | POA: Diagnosis not present

## 2015-06-09 DIAGNOSIS — K5909 Other constipation: Secondary | ICD-10-CM

## 2015-06-09 DIAGNOSIS — G8929 Other chronic pain: Secondary | ICD-10-CM

## 2015-06-09 DIAGNOSIS — J189 Pneumonia, unspecified organism: Secondary | ICD-10-CM

## 2015-06-09 DIAGNOSIS — G9340 Encephalopathy, unspecified: Secondary | ICD-10-CM

## 2015-06-09 DIAGNOSIS — M79671 Pain in right foot: Secondary | ICD-10-CM

## 2015-06-09 LAB — BASIC METABOLIC PANEL
Anion gap: 10 (ref 5–15)
BUN: <5 mg/dL — ABNORMAL LOW (ref 6–20)
CALCIUM: 8.6 mg/dL — AB (ref 8.9–10.3)
CO2: 27 mmol/L (ref 22–32)
CREATININE: 0.59 mg/dL — AB (ref 0.61–1.24)
Chloride: 99 mmol/L — ABNORMAL LOW (ref 101–111)
Glucose, Bld: 108 mg/dL — ABNORMAL HIGH (ref 65–99)
Potassium: 3 mmol/L — ABNORMAL LOW (ref 3.5–5.1)
SODIUM: 136 mmol/L (ref 135–145)

## 2015-06-09 LAB — CBC
HCT: 31.9 % — ABNORMAL LOW (ref 39.0–52.0)
Hemoglobin: 10.8 g/dL — ABNORMAL LOW (ref 13.0–17.0)
MCH: 29.3 pg (ref 26.0–34.0)
MCHC: 33.9 g/dL (ref 30.0–36.0)
MCV: 86.7 fL (ref 78.0–100.0)
Platelets: 343 10*3/uL (ref 150–400)
RBC: 3.68 MIL/uL — ABNORMAL LOW (ref 4.22–5.81)
RDW: 13.5 % (ref 11.5–15.5)
WBC: 11.8 10*3/uL — ABNORMAL HIGH (ref 4.0–10.5)

## 2015-06-09 LAB — STREP PNEUMONIAE URINARY ANTIGEN: Strep Pneumo Urinary Antigen: NEGATIVE

## 2015-06-09 LAB — MAGNESIUM: Magnesium: 1.6 mg/dL — ABNORMAL LOW (ref 1.7–2.4)

## 2015-06-09 MED ORDER — POTASSIUM CHLORIDE CRYS ER 20 MEQ PO TBCR
40.0000 meq | EXTENDED_RELEASE_TABLET | ORAL | Status: AC
Start: 2015-06-09 — End: 2015-06-09
  Administered 2015-06-09 (×2): 40 meq via ORAL
  Filled 2015-06-09 (×2): qty 2

## 2015-06-09 MED ORDER — IPRATROPIUM BROMIDE 0.02 % IN SOLN
0.5000 mg | Freq: Four times a day (QID) | RESPIRATORY_TRACT | Status: DC
  Administered 2015-06-09 (×3): 0.5 mg via RESPIRATORY_TRACT
  Filled 2015-06-09 (×3): qty 2.5

## 2015-06-09 MED ORDER — LEVALBUTEROL HCL 0.63 MG/3ML IN NEBU: 0.6300 mg | INHALATION_SOLUTION | RESPIRATORY_TRACT | Status: DC | PRN

## 2015-06-09 MED ORDER — METOPROLOL TARTRATE 25 MG PO TABS
25.0000 mg | ORAL_TABLET | Freq: Two times a day (BID) | ORAL | Status: DC
  Administered 2015-06-09 – 2015-06-11 (×5): 25 mg via ORAL
  Filled 2015-06-09 (×5): qty 1

## 2015-06-09 MED ORDER — MAGNESIUM SULFATE 4 GM/100ML IV SOLN
4.0000 g | Freq: Once | INTRAVENOUS | Status: AC
  Administered 2015-06-09: 4 g via INTRAVENOUS
  Filled 2015-06-09: qty 100

## 2015-06-09 MED ORDER — IPRATROPIUM BROMIDE 0.02 % IN SOLN: 0.5000 mg | RESPIRATORY_TRACT | Status: DC | PRN

## 2015-06-09 MED ORDER — POLYETHYLENE GLYCOL 3350 17 G PO PACK
17.0000 g | PACK | Freq: Every day | ORAL | Status: DC
  Administered 2015-06-10: 17 g via ORAL
  Filled 2015-06-09 (×3): qty 1

## 2015-06-09 MED ORDER — CEFTRIAXONE SODIUM 1 G IJ SOLR
1.0000 g | INTRAMUSCULAR | Status: DC
  Administered 2015-06-09 – 2015-06-11 (×3): 1 g via INTRAVENOUS
  Filled 2015-06-09 (×4): qty 10

## 2015-06-09 MED ORDER — SENNOSIDES-DOCUSATE SODIUM 8.6-50 MG PO TABS
1.0000 | ORAL_TABLET | Freq: Every day | ORAL | Status: DC
  Administered 2015-06-09 – 2015-06-11 (×3): 1 via ORAL
  Filled 2015-06-09 (×3): qty 1

## 2015-06-09 MED ORDER — LEVALBUTEROL HCL 0.63 MG/3ML IN NEBU
0.6300 mg | INHALATION_SOLUTION | Freq: Four times a day (QID) | RESPIRATORY_TRACT | Status: DC
  Administered 2015-06-09 (×3): 0.63 mg via RESPIRATORY_TRACT
  Filled 2015-06-09 (×3): qty 3

## 2015-06-09 NOTE — Progress Notes (Signed)
[facility]   Date of Admission:  06/05/2015  Antibiotics: Vancomycin and ceftazidime  Subjective: New pain in his right foot on top  Objective: Temp:  [97.9 F (36.6 C)-98.6 F (37 C)] 98.5 F (36.9 C) (09/24 1600) Pulse Rate:  [69-115] 69 (09/24 1300) Resp:  [13-27] 15 (09/24 1300) BP: (143-194)/(72-95) 162/84 mmHg (09/24 1300) SpO2:  [92 %-100 %] 96 % (09/24 1332) Weight:  [181 lb 14.1 oz (82.5 kg)] 181 lb 14.1 oz (82.5 kg) (09/24 0520)  General: awake, alert, somewhat slowed  Skin: no rashes Lungs: CTA B Cor: RRR Abdomen: soft, nt, nd Ext: right foot without erythema, no edema, tender HEENT: anicteric  A comprehensive review of systems was negative except for: Constitutional: positive for some back stiffness he attributes to the bed  Lab Results Lab Results  Component Value Date   WBC 11.8* 06/09/2015   HGB 10.8* 06/09/2015   HCT 31.9* 06/09/2015   MCV 86.7 06/09/2015   PLT 343 06/09/2015    Lab Results  Component Value Date   CREATININE 0.59* 06/09/2015   BUN <5* 06/09/2015   NA 136 06/09/2015   K 3.0* 06/09/2015   CL 99* 06/09/2015   CO2 27 06/09/2015    Lab Results  Component Value Date   ALT 16* 06/06/2015   AST 23 06/06/2015   ALKPHOS 72 06/06/2015   BILITOT 0.9 06/06/2015      Microbiology: Recent Results (from the past 240 hour(s))  Culture, blood (routine x 2)     Status: None (Preliminary result)   Collection Time: 06/05/15 12:50 PM  Result Value Ref Range Status   Specimen Description BLOOD LEFT FOREARM  Final   Special Requests BOTTLES DRAWN AEROBIC AND ANAEROBIC 5CC EACH  Final   Culture   Final    NO GROWTH 4 DAYS Performed at [facility]    Report Status PENDING  Incomplete  Culture, blood (routine x 2)     Status: None (Preliminary result)   Collection Time: 06/05/15  1:00 PM  Result Value Ref Range Status   Specimen Description BLOOD RIGHT HAND  Final   Special Requests BOTTLES DRAWN  AEROBIC ONLY [place]  Final   Culture   Final    NO GROWTH 4 DAYS Performed at [facility]    Report Status PENDING  Incomplete  Urine culture     Status: None   Collection Time: 06/05/15  2:15 PM  Result Value Ref Range Status   Specimen Description URINE, CATHETERIZED  Final   Special Requests NONE  Final   Culture   Final    NO GROWTH 1 DAY Performed at [facility]    Report Status 06/06/2015 FINAL  Final  MRSA PCR Screening     Status: None   Collection Time: 06/05/15 11:51 PM  Result Value Ref Range Status   MRSA by PCR NEGATIVE NEGATIVE Final    Comment:        The GeneXpert MRSA Assay (FDA approved for NASAL specimens only), is one component of a comprehensive MRSA colonization surveillance program. It is not intended to diagnose MRSA infection nor to guide or monitor treatment for MRSA infections.   Urine culture     Status: None   Collection Time: 06/06/15 11:33 AM  Result Value Ref Range Status   Specimen Description URINE, CATHETERIZED  Final   Special Requests NONE  Final   Culture   Final    NO GROWTH 1 DAY  Performed at [facility]    Report Status 06/07/2015 FINAL  Final    Studies/Results: Ct Chest W Contrast  06/08/2015   CLINICAL DATA:  Fever of unknown origin. Frequent small loose dark green stools with mucus. Abdominal pain.  EXAM: CT CHEST, ABDOMEN, AND PELVIS WITH CONTRAST  TECHNIQUE: Multidetector CT imaging of the chest, abdomen and pelvis was performed following the standard protocol during bolus administration of intravenous contrast.  CONTRAST:  35mL OMNIPAQUE IOHEXOL 300 MG/ML SOLN, 157mL OMNIPAQUE IOHEXOL 300 MG/ML SOLN  COMPARISON:  CT of the abdomen and pelvis on 06/05/2015  FINDINGS: CT CHEST FINDINGS  Heart: Heart size is normal. Significant coronary artery calcification. No pericardial effusion.  Vascular structures: There is atherosclerotic calcification of the thoracic aorta. Aorta is tortuous but not aneurysmal. Note  is made of bovine arch anatomy.  Mediastinum/thyroid: There is a 1.5 cm low-attenuation nodule within the left lobe of the thyroid. Small mediastinal lymph nodes are identified. Small right peritracheal lymph node is 1.1 cm. Subcarinal lymph node is 1.5 cm. Small hiatal hernia is present.  Lungs/Airways: There is are focal areas of infiltrate within the left upper lobe and right upper lobe, likely infectious. There are scattered emphysematous changes. Small bilateral pleural effusions, right greater than left.  Chest wall/osseous: Significant degenerative changes are identified in the thoracic spine. Spinal stimulator identified with tip at T7. No suspicious lytic or blastic lesions are identified. Chronic compression fracture of T12.  CT ABDOMEN AND PELVIS FINDINGS  Upper abdomen: Small amount of fluid is identified around the liver. No focal abnormality identified within the liver, spleen, pancreas, or adrenal glands. Small cyst is identified within the upper pole of the left kidney. No hydronephrosis. Right kidney is unremarkable in appearance. Gallbladder is distended without radiopaque calculi identified.  Gastrointestinal tract: The stomach and small bowel loops are normal in appearance. Colonic loops are normal in appearance. The appendix is not seen.  Pelvis: The urinary bladder is distended and contains a small amount of following recent Foley catheter. There has been development of a small amount of nonspecific fluid within the pelvis, identified within the pericolic gutters bilaterally and surrounding the base the bladder.  Retroperitoneum: There is atherosclerotic calcification of the abdominal aorta. Aorta is tortuous but not aneurysmal. No retroperitoneal or mesenteric adenopathy.  Abdominal wall: Unremarkable.  Osseous structures: Status post right hip arthroplasty. Significant degenerative changes in the lumbar spine. Wedge compression fracture of L1 and L2 chronic.  IMPRESSION: 1. Focal areas of  infiltrate within the left upper lobe and right upper lobe. Findings are likely infectious. 2. Interval development of a small amount of ascites and pelvic fluid. 3. Distended gallbladder without CT evidence for radiopaque calculi. 4. No bowel obstruction. 5. Significant coronary artery disease. 6. 1.5 cm nodule within the left lobe of the thyroid. Elective thyroid ultrasound is recommended based on consensus criteria. 7. Small hiatal hernia. 8. Spinal stimulator. 9. Numerous stable wedge compression fractures.   Electronically Signed   By: Nolon Nations M.D.   On: 06/08/2015 19:14   Ct Abdomen Pelvis W Contrast  06/08/2015   CLINICAL DATA:  Fever of unknown origin. Frequent small loose dark green stools with mucus. Abdominal pain.  EXAM: CT CHEST, ABDOMEN, AND PELVIS WITH CONTRAST  TECHNIQUE: Multidetector CT imaging of the chest, abdomen and pelvis was performed following the standard protocol during bolus administration of intravenous contrast.  CONTRAST:  67mL OMNIPAQUE IOHEXOL 300 MG/ML SOLN, 120mL OMNIPAQUE IOHEXOL 300 MG/ML SOLN  COMPARISON:  CT of the abdomen and pelvis on 06/05/2015  FINDINGS: CT CHEST FINDINGS  Heart: Heart size is normal. Significant coronary artery calcification. No pericardial effusion.  Vascular structures: There is atherosclerotic calcification of the thoracic aorta. Aorta is tortuous but not aneurysmal. Note is made of bovine arch anatomy.  Mediastinum/thyroid: There is a 1.5 cm low-attenuation nodule within the left lobe of the thyroid. Small mediastinal lymph nodes are identified. Small right peritracheal lymph node is 1.1 cm. Subcarinal lymph node is 1.5 cm. Small hiatal hernia is present.  Lungs/Airways: There is are focal areas of infiltrate within the left upper lobe and right upper lobe, likely infectious. There are scattered emphysematous changes. Small bilateral pleural effusions, right greater than left.  Chest wall/osseous: Significant degenerative changes are  identified in the thoracic spine. Spinal stimulator identified with tip at T7. No suspicious lytic or blastic lesions are identified. Chronic compression fracture of T12.  CT ABDOMEN AND PELVIS FINDINGS  Upper abdomen: Small amount of fluid is identified around the liver. No focal abnormality identified within the liver, spleen, pancreas, or adrenal glands. Small cyst is identified within the upper pole of the left kidney. No hydronephrosis. Right kidney is unremarkable in appearance. Gallbladder is distended without radiopaque calculi identified.  Gastrointestinal tract: The stomach and small bowel loops are normal in appearance. Colonic loops are normal in appearance. The appendix is not seen.  Pelvis: The urinary bladder is distended and contains a small amount of following recent Foley catheter. There has been development of a small amount of nonspecific fluid within the pelvis, identified within the pericolic gutters bilaterally and surrounding the base the bladder.  Retroperitoneum: There is atherosclerotic calcification of the abdominal aorta. Aorta is tortuous but not aneurysmal. No retroperitoneal or mesenteric adenopathy.  Abdominal wall: Unremarkable.  Osseous structures: Status post right hip arthroplasty. Significant degenerative changes in the lumbar spine. Wedge compression fracture of L1 and L2 chronic.  IMPRESSION: 1. Focal areas of infiltrate within the left upper lobe and right upper lobe. Findings are likely infectious. 2. Interval development of a small amount of ascites and pelvic fluid. 3. Distended gallbladder without CT evidence for radiopaque calculi. 4. No bowel obstruction. 5. Significant coronary artery disease. 6. 1.5 cm nodule within the left lobe of the thyroid. Elective thyroid ultrasound is recommended based on consensus criteria. 7. Small hiatal hernia. 8. Spinal stimulator. 9. Numerous stable wedge compression fractures.   Electronically Signed   By: Nolon Nations M.D.   On:  06/08/2015 19:14    Assessment/Plan:  1) Pneumonia - unclear if he really had pneumonia though since he presented with fever I would suspect he had this from the beginning and not developed during the hospitalization.  He has not been significantly hypoxic, some cough, and continues off O2. I don't feel he needs broad antibiotic coverage for this.  Will narrow.   2) encephalopathy - no obvious etiology.  He is improving.  May have been infection, could be medication effect such as SSRI cessation (apparently he often missed his medications).   3) fever - minimal (100.4 max) fever since 9/21.    4) foot pain - new.  Will check xray.    Scharlene Gloss, [place] [facility] [place] [email or web address] [phone] pager   [phone] cell 06/09/2015, 4:39 PM

## 2015-06-09 NOTE — Progress Notes (Signed)
[facility] PROGRESS NOTE  William Jordan AVW:[id number] DOB: Jun 08, 1946 DOA: 06/05/2015 PCP: [name], MD  Assessment/Plan: #1 sepsis likely secondary to HCAP/Fever likely secondary to HCAP Patient presenting with a fever with a temp of 101.4, heart rate of 103, white count of 17.5 and in acute renal failure with a creatinine of 3.8 BUN of 42 with confusion and also noted to be borderline hypotensive. Source of infection likely HCAP per CT chest 06/08/2015. Chest x-ray was negative for any acute infiltrate. Urinalysis was negative. Blood cultures pending. Urine cultures pending.  Repeat chest x-ray was negative. Repeat urinalysis with small leukocytes negative nitrites. Urine cultures pending.  Patient with improvement with fever and trending down of his fever curve. Leukocytosis trending down. Patient was seen in consultation by ID and IV Zosyn was changed to IV [place]. Continue empiric IV vancomycin. See #2. ID following and appreciate input and recommendations.   #2 healthcare associated pneumonia Per CT of the chest. Patient is currently afebrile. Fever curve trending down. Patient does state as a productive cough of brown greenish sputum. Check a sputum Gram stain and culture. Check a urine Legionella antigen. Check a urine pneumococcus antigen. Will place on Mucinex. Place on scheduled nebulizers. Continue empiric IV vancomycin and IV Fortaz. ID following.  #2 acute renal failure Questionable etiology. May be secondary to a prerenal azotemia in the setting of ACE inhibitor and possible postobstructive nephropathy as patient does have a history of BPH. CT abdomen and pelvis was negative for hydronephrosis. Foley catheter was in place and subsequently has been discontinued.  Patient with good urine output and had 4.8 liters urine output yesterday. Renal function improved. Creatinine on admission was 3.8 from 0.78 on 05/24/2015. Creatinine today is  0.59. Patient was seen in consultation by  nephrology who recommends not resume an ACE inhibitor at least 6-8 weeks. Senna lock IV fluids.  #3 hyperkalemia/ hypomagnesemia/hypokalemia Patient on admission was hyperkalemic likely secondary to acute renal failure. Patient is status post calcium gluconate, insulin and D50, Kayexalate with resolution of hyperkalemia. ACE inhibitor on hold. Patient now hypokalemic likely due to increased urine output of 4.8 L over the past 24 hours. Keep magnesium greater than 2. Replete potassium.   #4 urinary retention May be secondary to BPH. Patient status post Foley catheter placement. Repeat UA with cultures and sensitivities pending. Patient on empiric IV vancomycin and Fortaz. Foley catheter has been removed and patient with good urine output. Follow.  #5 constipation Per CT abdomen and pelvis.  Patient status post soapsuds enema with good bowel movement yesterday. MiraLAX and Senokot were held yesterday due to concern for loose stool which was likely related to contrast material. Will place back on MiraLAX daily and Senokot S at bedtime.   #6 acute encephalopathy Patient with clinical improvement. Patient alert and oriented 3. Acute encephalopathy likely multifactorial secondary to Withdrawal from MS Contin and Neurontin as patient's wife stated that patient suddenly stopped taking all his medications 1-2 weeks prior to admission.  Also secondary to healthcare associated pneumonia noted on CT chest from 06/08/2015, and secondary to medication induced secondary to Neurontin and pain medications/sedating medications in the setting of acute renal failure. Patient very agitated 3 days ago and delirious and confused and had to be placed in soft restraints. Patient with no improvement with soft restraints and was placed on Ativan as well as Haldol with no significant improvement with his agitation and delirium. Repeat chest x-ray negative for any acute infiltrate. Repeat urinalysis  With small  leukocytes nitrite  negative. Urine cultures pending. Patient was subsequently placed on the Precedex drip and critical care followed the patient during Precedex drip as been titrated down. CT head negative. Patient with significant clinical improvement alert oriented to self and place. Patient following commands. Patient is not aggressive or agitated. Continue empiric IV antibiotics. ID ff.   #7 hypertension  DC IV fluids. Norvasc has been increased to 10 mg daily. Add Lopressor 25 mg twice a day.   #8 chronic pain Continue home regimen of long-acting MS Contin of 60 mg every 8 hours. Continue neurontin at 1/2 home dose.    #9 leukocytosis  Likely secondary to healthcare associated pneumonia as noted on CT chest of 06/08/2015. WBC trending down. Patient has been pancultured and results are pending. Patient with fever curve trending down. Patient afebrile 24 hours. Check a sputum Gram stain and culture. Check a urine Legionella antigen. Check a urine pneumococcus antigen. CT chest which was done consistent with infiltrate in the left upper lobe and right upper lobe. CT abdomen and pelvis with no significant source for infection. Patient was seen in consultation by infectious diseases and continued on IV vancomycin. IV Zosyn has been changed to IV [place]. ID following and appreciate their recommendations.  #10 prophylaxis Heparin for DVT prophylaxis.   Code Status: Full Family Communication: Updated patient no family at bedside. Disposition Plan: Remain in the step down unit.   Consultants:  Nephrology: Dr. Jonnie Finner 06/05/2015  Infectious diseases: Dr. Megan Salon 06/07/2015  PCCM: Dr. Titus Mould  06/07/2015   Procedures:  CT abdomen and pelvis 06/05/2015  Chest x-ray 06/05/2015, 06/06/2015  CT head 06/07/2015  CT chest/abdomen/pelvis 06/08/2015  Antibiotics:  IV Rocephin 06/05/2015>>> 06/05/2015  IV azithromycin 06/05/2015>>>>06/05/2015  IV Zosyn 06/05/2015>>>>> 06/07/2015  IV vancomycin  06/05/2015  IV Tressie Ellis 06/07/2015  HPI/Subjective: Patient in bed alert oriented to self and place and time. Patient complaining of burning in his feet. Patient complaining of pain all over. Patient tolerating current diet. Patient states feeling better than on admission.  Objective: Filed Vitals:   06/09/15 0800  BP:   Pulse:   Temp: 97.9 F (36.6 C)  Resp:     Intake/Output Summary (Last 24 hours) at 06/09/15 0857 Last data filed at 06/09/15 0600  Gross per 24 hour  Intake 2127.5 ml  Output   4500 ml  Net -2372.5 ml   Filed Weights   06/06/15 0500 06/07/15 0400 06/09/15 0520  Weight: 88.7 kg (195 lb 8.8 oz) 87.7 kg (193 lb 5.5 oz) 82.5 kg (181 lb 14.1 oz)    Exam:   General:  NAD  Cardiovascular: RRR  Respiratory: CTAB. Fair airmovement. No wheezing.No crackles.  Abdomen: Soft, nontender, nondistended, positive bowel sounds.   Musculoskeletal: No clubbing cyanosis or edema.  Data Reviewed: Basic Metabolic Panel:  Recent Labs Lab 06/05/15 2053 06/06/15 0600 06/07/15 0410 06/08/15 0347 06/09/15 0345  NA 132* 130* 132* 132* 136  K 5.4* 4.7 4.0 3.4* 3.0*  CL 101 100* 100* 100* 99*  CO2 23 26 24 24 27   GLUCOSE 110* 104* 103* 116* 108*  BUN 36* 28* 10 9 <5*  CREATININE 2.41* 1.33* 0.69 0.67 0.59*  CALCIUM 7.9* 7.6* 8.1* 7.8* 8.6*  MG  --   --  1.5* 1.7 1.6*   Liver Function Tests:  Recent Labs Lab 06/05/15 1323 06/06/15 0600  AST 26 23  ALT 16* 16*  ALKPHOS 90 72  BILITOT 0.4 0.9  PROT 6.7 5.9*  ALBUMIN 3.8 3.1*  No results for input(s): LIPASE, AMYLASE in the last 168 hours. No results for input(s): AMMONIA in the last 168 hours. CBC:  Recent Labs Lab 06/05/15 1323 06/05/15 2053 06/06/15 0220 06/07/15 0410 06/08/15 0347 06/09/15 0345  WBC 17.5* 16.7* 16.4* 16.1* 13.3* 11.8*  NEUTROABS 14.5* 13.8*  --  12.9* 10.2*  --   HGB 9.7* 8.9* 8.5* 9.1* 9.2* 10.8*  HCT 29.4* 27.9* 26.0* 27.1* 27.5* 31.9*  MCV 89.9 89.7 90.9 88.9 87.3 86.7   PLT 346 274 310 260 274 343   Cardiac Enzymes: No results for input(s): CKTOTAL, CKMB, CKMBINDEX, TROPONINI in the last 168 hours. BNP (last 3 results) No results for input(s): BNP in the last 8760 hours.  ProBNP (last 3 results) No results for input(s): PROBNP in the last 8760 hours.  CBG:  Recent Labs Lab 06/05/15 1912  GLUCAP 59*    Recent Results (from the past 240 hour(s))  Culture, blood (routine x 2)     Status: None (Preliminary result)   Collection Time: 06/05/15 12:50 PM  Result Value Ref Range Status   Specimen Description BLOOD LEFT FOREARM  Final   Special Requests BOTTLES DRAWN AEROBIC AND ANAEROBIC 5CC EACH  Final   Culture   Final    NO GROWTH 3 DAYS Performed at [facility]    Report Status PENDING  Incomplete  Culture, blood (routine x 2)     Status: None (Preliminary result)   Collection Time: 06/05/15  1:00 PM  Result Value Ref Range Status   Specimen Description BLOOD RIGHT HAND  Final   Special Requests BOTTLES DRAWN AEROBIC ONLY [place]  Final   Culture   Final    NO GROWTH 3 DAYS Performed at [facility]    Report Status PENDING  Incomplete  Urine culture     Status: None   Collection Time: 06/05/15  2:15 PM  Result Value Ref Range Status   Specimen Description URINE, CATHETERIZED  Final   Special Requests NONE  Final   Culture   Final    NO GROWTH 1 DAY Performed at [facility]    Report Status 06/06/2015 FINAL  Final  MRSA PCR Screening     Status: None   Collection Time: 06/05/15 11:51 PM  Result Value Ref Range Status   MRSA by PCR NEGATIVE NEGATIVE Final    Comment:        The GeneXpert MRSA Assay (FDA approved for NASAL specimens only), is one component of a comprehensive MRSA colonization surveillance program. It is not intended to diagnose MRSA infection nor to guide or monitor treatment for MRSA infections.   Urine culture     Status: None   Collection Time: 06/06/15 11:33 AM  Result Value Ref  Range Status   Specimen Description URINE, CATHETERIZED  Final   Special Requests NONE  Final   Culture   Final    NO GROWTH 1 DAY Performed at [facility]    Report Status 06/07/2015 FINAL  Final     Studies: Ct Head Wo Contrast  06/07/2015   CLINICAL DATA:  Encephalopyosis  EXAM: CT HEAD WITHOUT CONTRAST  TECHNIQUE: Contiguous axial images were obtained from the base of the skull through the vertex without intravenous contrast.  COMPARISON:  None.  FINDINGS: Ventricles and sulci are appropriate for patient's age. No evidence for acute cortically based infarct, intracranial hemorrhage, mass lesion or mass-effect. The orbits are unremarkable. Paranasal sinuses are unremarkable. Mastoid air cells are well aerated.  Calvarium is intact.  IMPRESSION: No acute intracranial process.   Electronically Signed   By: Lovey Newcomer M.D.   On: 06/07/2015 15:44   Ct Chest W Contrast  06/08/2015   CLINICAL DATA:  Fever of unknown origin. Frequent small loose dark green stools with mucus. Abdominal pain.  EXAM: CT CHEST, ABDOMEN, AND PELVIS WITH CONTRAST  TECHNIQUE: Multidetector CT imaging of the chest, abdomen and pelvis was performed following the standard protocol during bolus administration of intravenous contrast.  CONTRAST:  26mL OMNIPAQUE IOHEXOL 300 MG/ML SOLN, 119mL OMNIPAQUE IOHEXOL 300 MG/ML SOLN  COMPARISON:  CT of the abdomen and pelvis on 06/05/2015  FINDINGS: CT CHEST FINDINGS  Heart: Heart size is normal. Significant coronary artery calcification. No pericardial effusion.  Vascular structures: There is atherosclerotic calcification of the thoracic aorta. Aorta is tortuous but not aneurysmal. Note is made of bovine arch anatomy.  Mediastinum/thyroid: There is a 1.5 cm low-attenuation nodule within the left lobe of the thyroid. Small mediastinal lymph nodes are identified. Small right peritracheal lymph node is 1.1 cm. Subcarinal lymph node is 1.5 cm. Small hiatal hernia is present.   Lungs/Airways: There is are focal areas of infiltrate within the left upper lobe and right upper lobe, likely infectious. There are scattered emphysematous changes. Small bilateral pleural effusions, right greater than left.  Chest wall/osseous: Significant degenerative changes are identified in the thoracic spine. Spinal stimulator identified with tip at T7. No suspicious lytic or blastic lesions are identified. Chronic compression fracture of T12.  CT ABDOMEN AND PELVIS FINDINGS  Upper abdomen: Small amount of fluid is identified around the liver. No focal abnormality identified within the liver, spleen, pancreas, or adrenal glands. Small cyst is identified within the upper pole of the left kidney. No hydronephrosis. Right kidney is unremarkable in appearance. Gallbladder is distended without radiopaque calculi identified.  Gastrointestinal tract: The stomach and small bowel loops are normal in appearance. Colonic loops are normal in appearance. The appendix is not seen.  Pelvis: The urinary bladder is distended and contains a small amount of following recent Foley catheter. There has been development of a small amount of nonspecific fluid within the pelvis, identified within the pericolic gutters bilaterally and surrounding the base the bladder.  Retroperitoneum: There is atherosclerotic calcification of the abdominal aorta. Aorta is tortuous but not aneurysmal. No retroperitoneal or mesenteric adenopathy.  Abdominal wall: Unremarkable.  Osseous structures: Status post right hip arthroplasty. Significant degenerative changes in the lumbar spine. Wedge compression fracture of L1 and L2 chronic.  IMPRESSION: 1. Focal areas of infiltrate within the left upper lobe and right upper lobe. Findings are likely infectious. 2. Interval development of a small amount of ascites and pelvic fluid. 3. Distended gallbladder without CT evidence for radiopaque calculi. 4. No bowel obstruction. 5. Significant coronary artery  disease. 6. 1.5 cm nodule within the left lobe of the thyroid. Elective thyroid ultrasound is recommended based on consensus criteria. 7. Small hiatal hernia. 8. Spinal stimulator. 9. Numerous stable wedge compression fractures.   Electronically Signed   By: Nolon Nations M.D.   On: 06/08/2015 19:14   Ct Abdomen Pelvis W Contrast  06/08/2015   CLINICAL DATA:  Fever of unknown origin. Frequent small loose dark green stools with mucus. Abdominal pain.  EXAM: CT CHEST, ABDOMEN, AND PELVIS WITH CONTRAST  TECHNIQUE: Multidetector CT imaging of the chest, abdomen and pelvis was performed following the standard protocol during bolus administration of intravenous contrast.  CONTRAST:  77mL OMNIPAQUE IOHEXOL 300 MG/ML SOLN,  153mL OMNIPAQUE IOHEXOL 300 MG/ML SOLN  COMPARISON:  CT of the abdomen and pelvis on 06/05/2015  FINDINGS: CT CHEST FINDINGS  Heart: Heart size is normal. Significant coronary artery calcification. No pericardial effusion.  Vascular structures: There is atherosclerotic calcification of the thoracic aorta. Aorta is tortuous but not aneurysmal. Note is made of bovine arch anatomy.  Mediastinum/thyroid: There is a 1.5 cm low-attenuation nodule within the left lobe of the thyroid. Small mediastinal lymph nodes are identified. Small right peritracheal lymph node is 1.1 cm. Subcarinal lymph node is 1.5 cm. Small hiatal hernia is present.  Lungs/Airways: There is are focal areas of infiltrate within the left upper lobe and right upper lobe, likely infectious. There are scattered emphysematous changes. Small bilateral pleural effusions, right greater than left.  Chest wall/osseous: Significant degenerative changes are identified in the thoracic spine. Spinal stimulator identified with tip at T7. No suspicious lytic or blastic lesions are identified. Chronic compression fracture of T12.  CT ABDOMEN AND PELVIS FINDINGS  Upper abdomen: Small amount of fluid is identified around the liver. No focal abnormality  identified within the liver, spleen, pancreas, or adrenal glands. Small cyst is identified within the upper pole of the left kidney. No hydronephrosis. Right kidney is unremarkable in appearance. Gallbladder is distended without radiopaque calculi identified.  Gastrointestinal tract: The stomach and small bowel loops are normal in appearance. Colonic loops are normal in appearance. The appendix is not seen.  Pelvis: The urinary bladder is distended and contains a small amount of following recent Foley catheter. There has been development of a small amount of nonspecific fluid within the pelvis, identified within the pericolic gutters bilaterally and surrounding the base the bladder.  Retroperitoneum: There is atherosclerotic calcification of the abdominal aorta. Aorta is tortuous but not aneurysmal. No retroperitoneal or mesenteric adenopathy.  Abdominal wall: Unremarkable.  Osseous structures: Status post right hip arthroplasty. Significant degenerative changes in the lumbar spine. Wedge compression fracture of L1 and L2 chronic.  IMPRESSION: 1. Focal areas of infiltrate within the left upper lobe and right upper lobe. Findings are likely infectious. 2. Interval development of a small amount of ascites and pelvic fluid. 3. Distended gallbladder without CT evidence for radiopaque calculi. 4. No bowel obstruction. 5. Significant coronary artery disease. 6. 1.5 cm nodule within the left lobe of the thyroid. Elective thyroid ultrasound is recommended based on consensus criteria. 7. Small hiatal hernia. 8. Spinal stimulator. 9. Numerous stable wedge compression fractures.   Electronically Signed   By: Nolon Nations M.D.   On: 06/08/2015 19:14    Scheduled Meds: . amLODipine  10 mg Oral Daily  . cefTAZidime (FORTAZ)  IV  2 g Intravenous Q8H  . citalopram  20 mg Oral Daily  . gabapentin  300 mg Oral TID AC  . gabapentin  600 mg Oral QHS  . heparin  5,000 Units Subcutaneous 3 times per day  . ipratropium  0.5  mg Nebulization Q6H  . levalbuterol  0.63 mg Nebulization Q6H  . magnesium sulfate 1 - 4 g bolus IVPB  4 g Intravenous Once  . metoprolol tartrate  25 mg Oral BID  . morphine  60 mg Oral Q8H  . polyethylene glycol  17 g Oral Daily  . potassium chloride  40 mEq Oral Q4H  . senna-docusate  1 tablet Oral QHS  . sodium chloride  3 mL Intravenous Q12H  . vancomycin  1,000 mg Intravenous Q8H   Continuous Infusions:    Principal Problem:   Fever  Active Problems:   Sepsis   HCAP (healthcare-associated pneumonia)   Chronic pain   DDD (degenerative disc disease), lumbar   Polypharmacy   Degenerative arthritis of thoracic spine   Toxic metabolic encephalopathy   Hyperkalemia   Constipation   Acute renal failure syndrome   Acute encephalopathy   Hypokalemia    Time spent: 40 mins    [facility] MD [facility] Pager [phone]. If 7PM-7AM, please contact night-coverage at [email or web address], password [facility] 06/09/2015, 8:57 AM  LOS: 4 days

## 2015-06-10 ENCOUNTER — Inpatient Hospital Stay (HOSPITAL_COMMUNITY): Payer: Medicare Other

## 2015-06-10 DIAGNOSIS — M25571 Pain in right ankle and joints of right foot: Secondary | ICD-10-CM

## 2015-06-10 LAB — CULTURE, BLOOD (ROUTINE X 2)
Culture: NO GROWTH
Culture: NO GROWTH

## 2015-06-10 LAB — URIC ACID: Uric Acid, Serum: 4.4 mg/dL (ref 4.4–7.6)

## 2015-06-10 LAB — BASIC METABOLIC PANEL
Anion gap: 10 (ref 5–15)
BUN: 6 mg/dL (ref 6–20)
CHLORIDE: 98 mmol/L — AB (ref 101–111)
CO2: 26 mmol/L (ref 22–32)
CREATININE: 0.64 mg/dL (ref 0.61–1.24)
Calcium: 8.7 mg/dL — ABNORMAL LOW (ref 8.9–10.3)
GFR calc Af Amer: >60 mL/min (ref >60)
GFR calc non Af Amer: >60 mL/min (ref >60)
Glucose, Bld: 103 mg/dL — ABNORMAL HIGH (ref 65–99)
POTASSIUM: 4 mmol/L (ref 3.5–5.1)
Sodium: 134 mmol/L — ABNORMAL LOW (ref 135–145)

## 2015-06-10 LAB — CBC
HEMATOCRIT: 32.5 % — AB (ref 39.0–52.0)
HEMOGLOBIN: 10.7 g/dL — AB (ref 13.0–17.0)
MCH: 28.8 pg (ref 26.0–34.0)
MCHC: 32.9 g/dL (ref 30.0–36.0)
MCV: 87.4 fL (ref 78.0–100.0)
Platelets: 368 10*3/uL (ref 150–400)
RBC: 3.72 MIL/uL — AB (ref 4.22–5.81)
RDW: 13.7 % (ref 11.5–15.5)
WBC: 11.9 10*3/uL — ABNORMAL HIGH (ref 4.0–10.5)

## 2015-06-10 LAB — MAGNESIUM: Magnesium: 1.8 mg/dL (ref 1.7–2.4)

## 2015-06-10 LAB — HIV ANTIBODY (ROUTINE TESTING W REFLEX): HIV SCREEN 4TH GENERATION: NONREACTIVE

## 2015-06-10 MED ORDER — METHYLPREDNISOLONE SODIUM SUCC 125 MG IJ SOLR
60.0000 mg | Freq: Once | INTRAMUSCULAR | Status: AC
  Administered 2015-06-10: 60 mg via INTRAVENOUS
  Filled 2015-06-10: qty 2

## 2015-06-10 NOTE — Progress Notes (Signed)
[facility] PROGRESS NOTE  SALMAAN PATCHIN TIW:[id number] DOB: [birth year] DOA: 06/05/2015 PCP: [name], MD  Assessment/Plan: #1 sepsis likely secondary to HCAP/Fever likely secondary to HCAP Patient presented on admission with a fever with a temp of 101.4, heart rate of 103, white count of 17.5 and in acute renal failure with a creatinine of 3.8 BUN of 42 with confusion and also noted to be borderline hypotensive. Source of infection likely HCAP per CT chest 06/08/2015. Chest x-ray was negative for any acute infiltrate. Urinalysis was negative. Blood cultures pending. Urine cultures pending.  Repeat chest x-ray was negative. Repeat urinalysis with small leukocytes negative nitrites. Urine cultures pending.  Patient with improvement with fever and trending down of his fever curve. Leukocytosis trending down. Patient was seen in consultation by ID and IV Zosyn was changed to IV [place]. IV antibiotics have been narrowed down to IV Rocephin per ID. See #2. ID following and appreciate input and recommendations.   #2 healthcare associated pneumonia Per CT of the chest. Patient is currently afebrile. Fever curve trending down. Patient does state as a productive cough of brown greenish sputum. Sputum Gram stain and culture pending . Urine Legionella antigen pending. Urine pneumococcus antigen is negative. Continue Mucinex, scheduled nebulizers.  IV antibiotics have been narrowed down to IV Rocephin per ID. ID following.   #2 acute renal failure Questionable etiology. May be secondary to a prerenal azotemia in the setting of ACE inhibitor and possible postobstructive nephropathy as patient does have a history of BPH. CT abdomen and pelvis was negative for hydronephrosis. Foley catheter was in place and subsequently has been discontinued.  Patient with good urine output. Renal function improved. Creatinine on admission was 3.8 from 0.78 on 05/24/2015. Creatinine today is  0.64. Patient was seen in consultation  by nephrology who recommends not resume an ACE inhibitor at least 6-8 weeks. Saline lock IV fluids.  #3 hyperkalemia/ hypomagnesemia/hypokalemia Patient on admission was hyperkalemic likely secondary to acute renal failure. Patient is status post calcium gluconate, insulin and D50, Kayexalate with resolution of hyperkalemia. ACE inhibitor on hold. Patient was hypokalemic yesterday, likely due to increased urine output. Keep magnesium greater than 2. Repleted potassium.   #4 urinary retention May be secondary to BPH. Patient status post Foley catheter placement. Repeat UA with cultures and sensitivities pending. Patient on empiric IV antibiotics. Foley catheter has been removed and patient with good urine output. Follow.  #5 constipation Per CT abdomen and pelvis.  Patient status post soapsuds enema with good bowel movement yesterday. MiraLAX and Senokot were held yesterday due to concern for loose stool which was likely related to contrast material. Continue current regimen of MiraLAX daily and Senokot S at bedtime.   #6 acute encephalopathy Patient with clinical improvement. Patient alert and oriented 3. Acute encephalopathy likely multifactorial secondary to Withdrawal from MS Contin and Neurontin as patient's wife stated that patient suddenly stopped taking all his medications 1-2 weeks prior to admission.  Also secondary to healthcare associated pneumonia noted on CT chest from 06/08/2015, and secondary to medication induced secondary to Neurontin and pain medications/sedating medications in the setting of acute renal failure. Patient very agitated 4 days ago and delirious and confused and had to be placed in soft restraints. Patient with no improvement with soft restraints and was placed on Ativan as well as Haldol with no significant improvement with his agitation and delirium. Repeat chest x-ray negative for any acute infiltrate. Repeat urinalysis  With small leukocytes nitrite negative. Urine  cultures  pending. Patient was subsequently placed on the Precedex drip and critical care followed the patient during Precedex drip as been titrated off. CT head negative. Patient with significant clinical improvement alert oriented to self and place. Patient following commands. Patient is not aggressive or agitated. Continue empiric IV antibiotics. ID ff.   #7 hypertension  Continue Norvasc 10 mg daily. DC IV fluids. Norvasc has been increased to 10 mg daily. Continue Lopressor 25 mg twice daily.   #8 chronic pain Continue home regimen of long-acting MS Contin of 60 mg every 8 hours. Continue neurontin at 1/2 home dose.    #9 leukocytosis  Likely secondary to healthcare associated pneumonia as noted on CT chest of 06/08/2015. WBC trending down. Patient has been pancultured and results are pending with no growth to date. Urine strep pneumococcus antigen is negative. Patient with fever curve trending down. Patient afebrile 48 hours. Sputum Gram stain and culture pending. Urine Legionella antigen pending. CT chest which was done consistent with infiltrate in the left upper lobe and right upper lobe. CT abdomen and pelvis with no significant source for infection. Patient was seen in consultation by infectious diseases and antibiotic coverage has been narrowed down from IV vancomycin and IV Fortaz to IV Rocephin. ID following and appreciate their recommendations.  #10 right ankle pain Plain films of the right foot negative. Right ankle and dorsum of foot tender to palpation with some warmth. Patient denies any history of gout. Check a uric acid. Will give a one-time dose of IV Solu-Medrol for probable gout.  #11 prophylaxis Heparin for DVT prophylaxis.   Code Status: Full Family Communication: Updated patient no family at bedside. Disposition Plan: Transfer to telemetry.   Consultants:  Nephrology: Dr. Jonnie Finner 06/05/2015  Infectious diseases: Dr. Megan Salon 06/07/2015  PCCM: Dr. Titus Mould   06/07/2015   Procedures:  CT abdomen and pelvis 06/05/2015  Chest x-ray 06/05/2015, 06/06/2015  CT head 06/07/2015  CT chest/abdomen/pelvis 06/08/2015  Plain films of the right foot 06/09/2015  Antibiotics:  IV Rocephin 06/05/2015>>> 06/05/2015  IV azithromycin 06/05/2015>>>>06/05/2015  IV Zosyn 06/05/2015>>>>> 06/07/2015  IV vancomycin 06/05/2015>>>>> 06/09/2015  IV Fortaz 06/07/2015>>>> 06/09/2015   IV Rocephin 06/09/2015    HPI/Subjective: Patient in bed alert oriented to self and place and time. Patient complaining of pain in right ankle and dorsum of right foot which she states is slightly better than it was last night. Patient states cough is improving. Patient tolerating current diet. Patient states feeling better than on admission.  Objective: Filed Vitals:   06/10/15 0800  BP:   Pulse:   Temp: 98.5 F (36.9 C)  Resp:     Intake/Output Summary (Last 24 hours) at 06/10/15 0825 Last data filed at 06/10/15 0600  Gross per 24 hour  Intake      0 ml  Output   1350 ml  Net  -1350 ml   Filed Weights   06/06/15 0500 06/07/15 0400 06/09/15 0520  Weight: 88.7 kg (195 lb 8.8 oz) 87.7 kg (193 lb 5.5 oz) 82.5 kg (181 lb 14.1 oz)    Exam:   General:  NAD  Cardiovascular: RRR  Respiratory: CTAB. Fair airmovement. No wheezing.No crackles.  Abdomen: Soft, nontender, nondistended, positive bowel sounds.   Musculoskeletal: No clubbing cyanosis or edema. Right ankle and dorsum of foot TTP, with some warmth.  Data Reviewed: Basic Metabolic Panel:  Recent Labs Lab 06/06/15 0600 06/07/15 0410 06/08/15 0347 06/09/15 0345 06/10/15 0334  NA 130* 132* 132* 136 134*  K 4.7 4.0  3.4* 3.0* 4.0  CL 100* 100* 100* 99* 98*  CO2 26 24 24 27 26   GLUCOSE 104* 103* 116* 108* 103*  BUN 28* 10 9 <5* 6  CREATININE 1.33* 0.69 0.67 0.59* 0.64  CALCIUM 7.6* 8.1* 7.8* 8.6* 8.7*  MG  --  1.5* 1.7 1.6* 1.8   Liver Function Tests:  Recent Labs Lab 06/05/15 1323  06/06/15 0600  AST 26 23  ALT 16* 16*  ALKPHOS 90 72  BILITOT 0.4 0.9  PROT 6.7 5.9*  ALBUMIN 3.8 3.1*   No results for input(s): LIPASE, AMYLASE in the last 168 hours. No results for input(s): AMMONIA in the last 168 hours. CBC:  Recent Labs Lab 06/05/15 1323 06/05/15 2053 06/06/15 0220 06/07/15 0410 06/08/15 0347 06/09/15 0345 06/10/15 0334  WBC 17.5* 16.7* 16.4* 16.1* 13.3* 11.8* 11.9*  NEUTROABS 14.5* 13.8*  --  12.9* 10.2*  --   --   HGB 9.7* 8.9* 8.5* 9.1* 9.2* 10.8* 10.7*  HCT 29.4* 27.9* 26.0* 27.1* 27.5* 31.9* 32.5*  MCV 89.9 89.7 90.9 88.9 87.3 86.7 87.4  PLT 346 274 310 260 274 343 368   Cardiac Enzymes: No results for input(s): CKTOTAL, CKMB, CKMBINDEX, TROPONINI in the last 168 hours. BNP (last 3 results) No results for input(s): BNP in the last 8760 hours.  ProBNP (last 3 results) No results for input(s): PROBNP in the last 8760 hours.  CBG:  Recent Labs Lab 06/05/15 1912  GLUCAP 59*    Recent Results (from the past 240 hour(s))  Culture, blood (routine x 2)     Status: None (Preliminary result)   Collection Time: 06/05/15 12:50 PM  Result Value Ref Range Status   Specimen Description BLOOD LEFT FOREARM  Final   Special Requests BOTTLES DRAWN AEROBIC AND ANAEROBIC 5CC EACH  Final   Culture   Final    NO GROWTH 4 DAYS Performed at [facility]    Report Status PENDING  Incomplete  Culture, blood (routine x 2)     Status: None (Preliminary result)   Collection Time: 06/05/15  1:00 PM  Result Value Ref Range Status   Specimen Description BLOOD RIGHT HAND  Final   Special Requests BOTTLES DRAWN AEROBIC ONLY [place]  Final   Culture   Final    NO GROWTH 4 DAYS Performed at [facility]    Report Status PENDING  Incomplete  Urine culture     Status: None   Collection Time: 06/05/15  2:15 PM  Result Value Ref Range Status   Specimen Description URINE, CATHETERIZED  Final   Special Requests NONE  Final   Culture   Final    NO  GROWTH 1 DAY Performed at [facility]    Report Status 06/06/2015 FINAL  Final  MRSA PCR Screening     Status: None   Collection Time: 06/05/15 11:51 PM  Result Value Ref Range Status   MRSA by PCR NEGATIVE NEGATIVE Final    Comment:        The GeneXpert MRSA Assay (FDA approved for NASAL specimens only), is one component of a comprehensive MRSA colonization surveillance program. It is not intended to diagnose MRSA infection nor to guide or monitor treatment for MRSA infections.   Urine culture     Status: None   Collection Time: 06/06/15 11:33 AM  Result Value Ref Range Status   Specimen Description URINE, CATHETERIZED  Final   Special Requests NONE  Final   Culture   Final  NO GROWTH 1 DAY Performed at [facility]    Report Status 06/07/2015 FINAL  Final     Studies: Ct Chest W Contrast  06/08/2015   CLINICAL DATA:  Fever of unknown origin. Frequent small loose dark green stools with mucus. Abdominal pain.  EXAM: CT CHEST, ABDOMEN, AND PELVIS WITH CONTRAST  TECHNIQUE: Multidetector CT imaging of the chest, abdomen and pelvis was performed following the standard protocol during bolus administration of intravenous contrast.  CONTRAST:  56mL OMNIPAQUE IOHEXOL 300 MG/ML SOLN, 151mL OMNIPAQUE IOHEXOL 300 MG/ML SOLN  COMPARISON:  CT of the abdomen and pelvis on 06/05/2015  FINDINGS: CT CHEST FINDINGS  Heart: Heart size is normal. Significant coronary artery calcification. No pericardial effusion.  Vascular structures: There is atherosclerotic calcification of the thoracic aorta. Aorta is tortuous but not aneurysmal. Note is made of bovine arch anatomy.  Mediastinum/thyroid: There is a 1.5 cm low-attenuation nodule within the left lobe of the thyroid. Small mediastinal lymph nodes are identified. Small right peritracheal lymph node is 1.1 cm. Subcarinal lymph node is 1.5 cm. Small hiatal hernia is present.  Lungs/Airways: There is are focal areas of infiltrate within the  left upper lobe and right upper lobe, likely infectious. There are scattered emphysematous changes. Small bilateral pleural effusions, right greater than left.  Chest wall/osseous: Significant degenerative changes are identified in the thoracic spine. Spinal stimulator identified with tip at T7. No suspicious lytic or blastic lesions are identified. Chronic compression fracture of T12.  CT ABDOMEN AND PELVIS FINDINGS  Upper abdomen: Small amount of fluid is identified around the liver. No focal abnormality identified within the liver, spleen, pancreas, or adrenal glands. Small cyst is identified within the upper pole of the left kidney. No hydronephrosis. Right kidney is unremarkable in appearance. Gallbladder is distended without radiopaque calculi identified.  Gastrointestinal tract: The stomach and small bowel loops are normal in appearance. Colonic loops are normal in appearance. The appendix is not seen.  Pelvis: The urinary bladder is distended and contains a small amount of following recent Foley catheter. There has been development of a small amount of nonspecific fluid within the pelvis, identified within the pericolic gutters bilaterally and surrounding the base the bladder.  Retroperitoneum: There is atherosclerotic calcification of the abdominal aorta. Aorta is tortuous but not aneurysmal. No retroperitoneal or mesenteric adenopathy.  Abdominal wall: Unremarkable.  Osseous structures: Status post right hip arthroplasty. Significant degenerative changes in the lumbar spine. Wedge compression fracture of L1 and L2 chronic.  IMPRESSION: 1. Focal areas of infiltrate within the left upper lobe and right upper lobe. Findings are likely infectious. 2. Interval development of a small amount of ascites and pelvic fluid. 3. Distended gallbladder without CT evidence for radiopaque calculi. 4. No bowel obstruction. 5. Significant coronary artery disease. 6. 1.5 cm nodule within the left lobe of the thyroid. Elective  thyroid ultrasound is recommended based on consensus criteria. 7. Small hiatal hernia. 8. Spinal stimulator. 9. Numerous stable wedge compression fractures.   Electronically Signed   By: Nolon Nations M.D.   On: 06/08/2015 19:14   Ct Abdomen Pelvis W Contrast  06/08/2015   CLINICAL DATA:  Fever of unknown origin. Frequent small loose dark green stools with mucus. Abdominal pain.  EXAM: CT CHEST, ABDOMEN, AND PELVIS WITH CONTRAST  TECHNIQUE: Multidetector CT imaging of the chest, abdomen and pelvis was performed following the standard protocol during bolus administration of intravenous contrast.  CONTRAST:  43mL OMNIPAQUE IOHEXOL 300 MG/ML SOLN, 133mL OMNIPAQUE IOHEXOL 300  MG/ML SOLN  COMPARISON:  CT of the abdomen and pelvis on 06/05/2015  FINDINGS: CT CHEST FINDINGS  Heart: Heart size is normal. Significant coronary artery calcification. No pericardial effusion.  Vascular structures: There is atherosclerotic calcification of the thoracic aorta. Aorta is tortuous but not aneurysmal. Note is made of bovine arch anatomy.  Mediastinum/thyroid: There is a 1.5 cm low-attenuation nodule within the left lobe of the thyroid. Small mediastinal lymph nodes are identified. Small right peritracheal lymph node is 1.1 cm. Subcarinal lymph node is 1.5 cm. Small hiatal hernia is present.  Lungs/Airways: There is are focal areas of infiltrate within the left upper lobe and right upper lobe, likely infectious. There are scattered emphysematous changes. Small bilateral pleural effusions, right greater than left.  Chest wall/osseous: Significant degenerative changes are identified in the thoracic spine. Spinal stimulator identified with tip at T7. No suspicious lytic or blastic lesions are identified. Chronic compression fracture of T12.  CT ABDOMEN AND PELVIS FINDINGS  Upper abdomen: Small amount of fluid is identified around the liver. No focal abnormality identified within the liver, spleen, pancreas, or adrenal glands. Small  cyst is identified within the upper pole of the left kidney. No hydronephrosis. Right kidney is unremarkable in appearance. Gallbladder is distended without radiopaque calculi identified.  Gastrointestinal tract: The stomach and small bowel loops are normal in appearance. Colonic loops are normal in appearance. The appendix is not seen.  Pelvis: The urinary bladder is distended and contains a small amount of following recent Foley catheter. There has been development of a small amount of nonspecific fluid within the pelvis, identified within the pericolic gutters bilaterally and surrounding the base the bladder.  Retroperitoneum: There is atherosclerotic calcification of the abdominal aorta. Aorta is tortuous but not aneurysmal. No retroperitoneal or mesenteric adenopathy.  Abdominal wall: Unremarkable.  Osseous structures: Status post right hip arthroplasty. Significant degenerative changes in the lumbar spine. Wedge compression fracture of L1 and L2 chronic.  IMPRESSION: 1. Focal areas of infiltrate within the left upper lobe and right upper lobe. Findings are likely infectious. 2. Interval development of a small amount of ascites and pelvic fluid. 3. Distended gallbladder without CT evidence for radiopaque calculi. 4. No bowel obstruction. 5. Significant coronary artery disease. 6. 1.5 cm nodule within the left lobe of the thyroid. Elective thyroid ultrasound is recommended based on consensus criteria. 7. Small hiatal hernia. 8. Spinal stimulator. 9. Numerous stable wedge compression fractures.   Electronically Signed   By: Nolon Nations M.D.   On: 06/08/2015 19:14   Dg Foot Complete Right  06/09/2015   CLINICAL DATA:  69 year old male with right-sided foot pain for the past 2-3 days. No known injury.  EXAM: RIGHT FOOT COMPLETE - 3+ VIEW  COMPARISON:  No priors.  FINDINGS: Degenerative changes of osteoarthritis are noted at the first MTP joint. No acute displaced fracture, subluxation or dislocation is  noted. Hammertoe deformities of toes 2 through 5.  IMPRESSION: 1. No acute radiographic abnormality of the right foot.   Electronically Signed   By: Vinnie Langton M.D.   On: 06/09/2015 17:21    Scheduled Meds: . amLODipine  10 mg Oral Daily  . cefTRIAXone (ROCEPHIN)  IV  1 g Intravenous Q24H  . citalopram  20 mg Oral Daily  . gabapentin  300 mg Oral TID AC  . gabapentin  600 mg Oral QHS  . heparin  5,000 Units Subcutaneous 3 times per day  . metoprolol tartrate  25 mg Oral BID  .  morphine  60 mg Oral Q8H  . polyethylene glycol  17 g Oral Daily  . senna-docusate  1 tablet Oral QHS  . sodium chloride  3 mL Intravenous Q12H   Continuous Infusions:    Principal Problem:   Fever Active Problems:   Sepsis   HCAP (healthcare-associated pneumonia)   Chronic pain   DDD (degenerative disc disease), lumbar   Polypharmacy   Degenerative arthritis of thoracic spine   Toxic metabolic encephalopathy   Hyperkalemia   Constipation   Acute renal failure syndrome   Acute encephalopathy   Hypokalemia   Acute right ankle pain    Time spent: 40 mins    [facility] MD [facility] Pager [phone]. If 7PM-7AM, please contact night-coverage at [email or web address], password [facility] 06/10/2015, 8:25 AM  LOS: 5 days

## 2015-06-10 NOTE — Progress Notes (Signed)
Called and gave report to [employer], Therapist, sports. Patient transported to 4W with NT. Wife at bedside.

## 2015-06-10 NOTE — Evaluation (Signed)
Occupational Therapy Evaluation Patient Details Name: William Jordan MRN: [id number] DOB: 01-Apr-1946 Today's Date: 06/10/2015    History of Present Illness Pt was admitted for fever/confusion and diagnosed with sepsis.  R ankle pain, xrays negative.  He has a h/o neuropathy and chronic pain.     Clinical Impression   This 69 year old man was admitted for the above.  He will benefit from skilled OT to increase safety and independence with ADLs.  Pt currently needs up to max A for LB adls and is limited by pain.  Goals in acute are for supervision level.  Pt was agreeable to OT evaluation, but during evaluation, he said that he thinks we are there a day too soon.  He would prefer to wait for PT until tomorrow; talked to PT.    Follow Up Recommendations  Supervision/Assistance - 24 hour;Home health OT (depending upon progress)    Equipment Recommendations  Other (comment) (likely none)    Recommendations for Other Services       Precautions / Restrictions Precautions Precautions: Fall Restrictions Weight Bearing Restrictions: No      Mobility Bed Mobility Overal bed mobility: Needs Assistance Bed Mobility: Supine to Sit     Supine to sit: Min assist     General bed mobility comments: HOB raised  Transfers Overall transfer level: Needs assistance Equipment used: Rolling walker (2 wheeled) Transfers: Sit to/from Stand Sit to Stand: Min assist;Mod assist         General transfer comment: min one trial and mod the other; cues for UE placement    Balance Overall balance assessment: Needs assistance Sitting-balance support: Bilateral upper extremity supported Sitting balance-Leahy Scale: Poor Sitting balance - Comments: when standing initially, pt had posterior LOB needing assistance to forward weight shift                                    ADL Overall ADL's : Needs assistance/impaired             Lower Body Bathing: Minimal assistance;Sit  to/from stand       Lower Body Dressing: Maximal assistance;Sit to/from stand                 General ADL Comments: Pt can perform UB adls with set up.  Stood twice, once after condom catheter came off to use urinal.  Pt able to take 2 side steps up bed with RW prior to sitting down.  Wife came in at end of session     Vision     Perception     Praxis      Pertinent Vitals/Pain Pain Assessment: 0-10 Pain Score: 5  Pain Location: R foot Pain Descriptors / Indicators: Aching Pain Intervention(s): Limited activity within patient's tolerance;Monitored during session;Repositioned;Patient requesting pain meds-RN notified     Hand Dominance     Extremity/Trunk Assessment Upper Extremity Assessment Upper Extremity Assessment: Generalized weakness           Communication Communication Communication: No difficulties   Cognition Arousal/Alertness: Awake/alert Behavior During Therapy: WFL for tasks assessed/performed Overall Cognitive Status: Within Functional Limits for tasks assessed                     General Comments       Exercises       Shoulder Instructions      Home Living Family/patient expects to be discharged to::  Private residence Living Arrangements: Spouse/significant other                 Bathroom Shower/Tub: Occupational psychologist: Handicapped height     Home Equipment: Environmental consultant - 2 wheels;Cane - single point;Shower seat - built in;Grab bars - tub/shower;Hand held shower head          Prior Functioning/Environment Level of Independence: Independent             OT Diagnosis: Generalized weakness   OT Problem List: Decreased strength;Decreased activity tolerance;Decreased knowledge of use of DME or AE;Pain;Impaired balance (sitting and/or standing);Decreased safety awareness   OT Treatment/Interventions: Self-care/ADL training;DME and/or AE instruction;Patient/family education;Balance training    OT  Goals(Current goals can be found in the care plan section) Acute Rehab OT Goals Patient Stated Goal: get strength back OT Goal Formulation: With patient Time For Goal Achievement: 06/24/15 Potential to Achieve Goals: Good ADL Goals Pt Will Perform Grooming: with supervision;standing Pt Will Perform Lower Body Bathing: with set-up;with supervision;sit to/from stand Pt Will Perform Lower Body Dressing: with set-up;with supervision;sit to/from stand Pt Will Transfer to Toilet: with supervision;ambulating (comfort height commode) Pt Will Perform Toileting - Clothing Manipulation and hygiene: with supervision;sit to/from stand Pt Will Perform Tub/Shower Transfer: Shower transfer;with supervision;ambulating;shower seat  OT Frequency: Min 2X/week   Barriers to D/C:            Co-evaluation              End of Session    Activity Tolerance: Patient tolerated treatment well Patient left: in bed;with call bell/phone within reach;with bed alarm set;with family/visitor present;with nursing/sitter in room   Time: 2919-1660 OT Time Calculation (min): 24 min Charges:  OT General Charges $OT Visit: 1 Procedure OT Evaluation $Initial OT Evaluation Tier I: 1 Procedure G-Codes:    SPENCER,MARYELLEN Jun 13, 2015, 10:49 AM  Lesle Chris, OTR/L [phone] 13-Jun-2015

## 2015-06-10 NOTE — Progress Notes (Signed)
PT Cancellation Note  Patient Details Name: RESHAD SAAB MRN: [id number] DOB: [birth year]   Cancelled Treatment:     PT eval order received but deferred at pt request until tomorrow.  Will follow in am.   BRADSHAW,HUNTER 06/10/2015, 12:54 PM

## 2015-06-11 DIAGNOSIS — R918 Other nonspecific abnormal finding of lung field: Secondary | ICD-10-CM

## 2015-06-11 LAB — CBC
HEMATOCRIT: 33.1 % — AB (ref 39.0–52.0)
Hemoglobin: 11.1 g/dL — ABNORMAL LOW (ref 13.0–17.0)
MCH: 28.8 pg (ref 26.0–34.0)
MCHC: 33.5 g/dL (ref 30.0–36.0)
MCV: 85.8 fL (ref 78.0–100.0)
Platelets: 412 10*3/uL — ABNORMAL HIGH (ref 150–400)
RBC: 3.86 MIL/uL — AB (ref 4.22–5.81)
RDW: 13.6 % (ref 11.5–15.5)
WBC: 16.5 10*3/uL — AB (ref 4.0–10.5)

## 2015-06-11 LAB — BASIC METABOLIC PANEL
ANION GAP: 11 (ref 5–15)
BUN: 18 mg/dL (ref 6–20)
CALCIUM: 9.1 mg/dL (ref 8.9–10.3)
CO2: 25 mmol/L (ref 22–32)
Chloride: 99 mmol/L — ABNORMAL LOW (ref 101–111)
Creatinine, Ser: 0.71 mg/dL (ref 0.61–1.24)
GFR calc non Af Amer: >60 mL/min (ref >60)
GLUCOSE: 132 mg/dL — AB (ref 65–99)
POTASSIUM: 4.1 mmol/L (ref 3.5–5.1)
Sodium: 135 mmol/L (ref 135–145)

## 2015-06-11 LAB — LEGIONELLA PNEUMOPHILA SEROGP 1 UR AG: L. pneumophila Serogp 1 Ur Ag: NEGATIVE

## 2015-06-11 MED ORDER — METOPROLOL TARTRATE 50 MG PO TABS
50.0000 mg | ORAL_TABLET | Freq: Two times a day (BID) | ORAL | Status: DC
  Administered 2015-06-11 – 2015-06-12 (×2): 50 mg via ORAL
  Filled 2015-06-11 (×2): qty 1

## 2015-06-11 MED ORDER — COLCHICINE 0.6 MG PO TABS
0.6000 mg | ORAL_TABLET | Freq: Two times a day (BID) | ORAL | Status: DC
Start: 2015-06-11 — End: 2015-06-12
  Administered 2015-06-11 – 2015-06-12 (×3): 0.6 mg via ORAL
  Filled 2015-06-11 (×3): qty 1

## 2015-06-11 NOTE — Care Management Important Message (Signed)
Important Message  Patient Details  Name: William Jordan MRN: [id number] Date of Birth: 1945/10/18   Medicare Important Message Given:  Yes-second notification given    Camillo Flaming 06/11/2015, 11:44 AMImportant Message  Patient Details  Name: William Jordan MRN: [id number] Date of Birth: May 28, 1946   Medicare Important Message Given:  Yes-second notification given    Camillo Flaming 06/11/2015, 11:44 AM

## 2015-06-11 NOTE — Progress Notes (Signed)
[facility] PROGRESS NOTE  William Jordan YDX:[id number] DOB: Sep 20, 1945 DOA: 06/05/2015 PCP: [name], MD  Assessment/Plan: #1 sepsis likely secondary to HCAP/Fever likely secondary to HCAP Patient presented on admission with a fever with a temp of 101.4, heart rate of 103, white count of 17.5 and in acute renal failure with a creatinine of 3.8 BUN of 42 with confusion and also noted to be borderline hypotensive. Source of infection likely HCAP per CT chest 06/08/2015. Chest x-ray was negative for any acute infiltrate. Urinalysis was negative. Blood cultures pending. Urine cultures pending.  Repeat chest x-ray was negative. Repeat urinalysis with small leukocytes negative nitrites. Urine cultures pending.  Patient with improvement with fever and trending down of his fever curve. Leukocytosis trending down. Patient was seen in consultation by ID and IV Zosyn was changed to IV [place]. IV antibiotics have been narrowed down to IV Rocephin per ID. See #2. ID following and appreciate input and recommendations.   #2 healthcare associated pneumonia Per CT of the chest. Patient is currently afebrile. Fever curve trending down. Patient does state as a productive cough of brown greenish sputum. Sputum Gram stain and culture pending . Urine Legionella antigen pending. Urine pneumococcus antigen is negative. Continue Mucinex, scheduled nebulizers.  IV antibiotics have been narrowed down to IV Rocephin per ID. ID following.   #2 acute renal failure Questionable etiology. May be secondary to a prerenal azotemia in the setting of ACE inhibitor and possible postobstructive nephropathy as patient does have a history of BPH. CT abdomen and pelvis was negative for hydronephrosis. Foley catheter was in place and subsequently has been discontinued.  Patient with good urine output. Renal function improved. Creatinine on admission was 3.8 from 0.78 on 05/24/2015. Creatinine today is  0.71. Patient was seen in consultation  by nephrology who recommended not resume an ACE inhibitor at least 6-8 weeks. Saline lock IV fluids.  #3 hyperkalemia/ hypomagnesemia/hypokalemia Patient on admission was hyperkalemic likely secondary to acute renal failure. Patient is status post calcium gluconate, insulin and D50, Kayexalate with resolution of hyperkalemia. ACE inhibitor on hold. Patient was hypokalemic yesterday, likely due to increased urine output. Keep magnesium greater than 2. Repleted potassium.   #4 urinary retention May be secondary to BPH. Patient status post Foley catheter placement. Repeat UA with cultures and sensitivities pending. Patient on empiric IV antibiotics. Foley catheter has been removed and patient with good urine output. Follow.  #5 constipation Per CT abdomen and pelvis.  Patient status post soapsuds enema with good bowel movement. Continue current regimen of MiraLAX daily and Senokot S at bedtime.   #6 acute encephalopathy Patient with clinical improvement and back to baseline. Patient alert and oriented 3. Acute encephalopathy likely multifactorial secondary to Withdrawal from MS Contin and Neurontin as patient's wife stated that patient suddenly stopped taking all his medications 1-2 weeks prior to admission.  Also secondary to healthcare associated pneumonia noted on CT chest from 06/08/2015, and secondary to medication induced secondary to Neurontin and pain medications/sedating medications in the setting of acute renal failure. Patient very agitated 5 days ago and delirious and confused and had to be placed in soft restraints. Patient with no improvement with soft restraints and was placed on Ativan as well as Haldol with no significant improvement with his agitation and delirium. Repeat chest x-ray negative for any acute infiltrate. Repeat urinalysis  With small leukocytes nitrite negative. Urine cultures pending. Patient was subsequently placed on the Precedex drip and critical care followed the  patient during  Precedex drip as been titrated off. CT head negative. Patient with significant clinical improvement alert oriented to self and place. Patient following commands. Patient is not aggressive or agitated. Continue empiric IV antibiotics. ID ff.   #7 hypertension  Continue Norvasc 10 mg daily. Increase lopressor to 50mg  BID.   #8 chronic pain Continue home regimen of long-acting MS Contin of 60 mg every 8 hours. Continue neurontin at 1/2 home dose.    #9 leukocytosis  Likely secondary to healthcare associated pneumonia as noted on CT chest of 06/08/2015. WBC trended back up likely secondary to dose of IV steriods given yesterday. Patient has been pancultured and results  with no growth to date. Urine strep pneumococcus antigen is negative. Patient with fever curve trending down. Patient afebrile 48 hours. Sputum Gram stain and culture pending. Urine Legionella antigen pending. CT chest which was done consistent with infiltrate in the left upper lobe and right upper lobe. CT abdomen and pelvis with no significant source for infection. Patient was seen in consultation by infectious diseases and antibiotic coverage has been narrowed down from IV vancomycin and IV Fortaz to IV Rocephin. ID following and appreciate their recommendations.  #10 right ankle pain likely gout Plain films of the right foot negative. Uric acid at 4.4. Patient however noted to have uric acid crystals in UA on 06/06/2015. No further foot pain after dose of steriods. Place on colchicine BID.  #11 prophylaxis Heparin for DVT prophylaxis.   Code Status: Full Family Communication: Updated patient no family at bedside. Disposition Plan: Hopefully home tomorrow.   Consultants:  Nephrology: Dr. Jonnie Finner 06/05/2015  Infectious diseases: Dr. Megan Salon 06/07/2015  PCCM: Dr. Titus Mould  06/07/2015   Procedures:  CT abdomen and pelvis 06/05/2015  Chest x-ray 06/05/2015, 06/06/2015  CT head 06/07/2015  CT  chest/abdomen/pelvis 06/08/2015  Plain films of the right foot 06/09/2015  Antibiotics:  IV Rocephin 06/05/2015>>> 06/05/2015  IV azithromycin 06/05/2015>>>>06/05/2015  IV Zosyn 06/05/2015>>>>> 06/07/2015  IV vancomycin 06/05/2015>>>>> 06/09/2015  IV Fortaz 06/07/2015>>>> 06/09/2015   IV Rocephin 06/09/2015    HPI/Subjective: Patient in bed alert oriented to self and place and time. Patient states pain in ankle resolved after dose of IV steriod.  Objective: Filed Vitals:   06/11/15 0941  BP: 152/82  Pulse: 67  Temp:   Resp:     Intake/Output Summary (Last 24 hours) at 06/11/15 1141 Last data filed at 06/11/15 0700  Gross per 24 hour  Intake   1060 ml  Output    500 ml  Net    560 ml   Filed Weights   06/06/15 0500 06/07/15 0400 06/09/15 0520  Weight: 88.7 kg (195 lb 8.8 oz) 87.7 kg (193 lb 5.5 oz) 82.5 kg (181 lb 14.1 oz)    Exam:   General:  NAD  Cardiovascular: RRR  Respiratory: CTAB. Fair airmovement. No wheezing.No crackles.  Abdomen: Soft, nontender, nondistended, positive bowel sounds.   Musculoskeletal: No clubbing cyanosis or edema. No pain in right foot.  Data Reviewed: Basic Metabolic Panel:  Recent Labs Lab 06/07/15 0410 06/08/15 0347 06/09/15 0345 06/10/15 0334 06/11/15 0547  NA 132* 132* 136 134* 135  K 4.0 3.4* 3.0* 4.0 4.1  CL 100* 100* 99* 98* 99*  CO2 24 24 27 26 25   GLUCOSE 103* 116* 108* 103* 132*  BUN 10 9 <5* 6 18  CREATININE 0.69 0.67 0.59* 0.64 0.71  CALCIUM 8.1* 7.8* 8.6* 8.7* 9.1  MG 1.5* 1.7 1.6* 1.8  --    Liver Function  Tests:  Recent Labs Lab 06/05/15 1323 06/06/15 0600  AST 26 23  ALT 16* 16*  ALKPHOS 90 72  BILITOT 0.4 0.9  PROT 6.7 5.9*  ALBUMIN 3.8 3.1*   No results for input(s): LIPASE, AMYLASE in the last 168 hours. No results for input(s): AMMONIA in the last 168 hours. CBC:  Recent Labs Lab 06/05/15 1323 06/05/15 2053  06/07/15 0410 06/08/15 0347 06/09/15 0345 06/10/15 0334  06/11/15 0547  WBC 17.5* 16.7*  < > 16.1* 13.3* 11.8* 11.9* 16.5*  NEUTROABS 14.5* 13.8*  --  12.9* 10.2*  --   --   --   HGB 9.7* 8.9*  < > 9.1* 9.2* 10.8* 10.7* 11.1*  HCT 29.4* 27.9*  < > 27.1* 27.5* 31.9* 32.5* 33.1*  MCV 89.9 89.7  < > 88.9 87.3 86.7 87.4 85.8  PLT 346 274  < > 260 274 343 368 412*  < > = values in this interval not displayed. Cardiac Enzymes: No results for input(s): CKTOTAL, CKMB, CKMBINDEX, TROPONINI in the last 168 hours. BNP (last 3 results) No results for input(s): BNP in the last 8760 hours.  ProBNP (last 3 results) No results for input(s): PROBNP in the last 8760 hours.  CBG:  Recent Labs Lab 06/05/15 1912  GLUCAP 59*    Recent Results (from the past 240 hour(s))  Culture, blood (routine x 2)     Status: None   Collection Time: 06/05/15 12:50 PM  Result Value Ref Range Status   Specimen Description BLOOD LEFT FOREARM  Final   Special Requests BOTTLES DRAWN AEROBIC AND ANAEROBIC 5CC EACH  Final   Culture   Final    NO GROWTH 5 DAYS Performed at [facility]    Report Status 06/10/2015 FINAL  Final  Culture, blood (routine x 2)     Status: None   Collection Time: 06/05/15  1:00 PM  Result Value Ref Range Status   Specimen Description BLOOD RIGHT HAND  Final   Special Requests BOTTLES DRAWN AEROBIC ONLY [place]  Final   Culture   Final    NO GROWTH 5 DAYS Performed at [facility]    Report Status 06/10/2015 FINAL  Final  Urine culture     Status: None   Collection Time: 06/05/15  2:15 PM  Result Value Ref Range Status   Specimen Description URINE, CATHETERIZED  Final   Special Requests NONE  Final   Culture   Final    NO GROWTH 1 DAY Performed at [facility]    Report Status 06/06/2015 FINAL  Final  MRSA PCR Screening     Status: None   Collection Time: 06/05/15 11:51 PM  Result Value Ref Range Status   MRSA by PCR NEGATIVE NEGATIVE Final    Comment:        The GeneXpert MRSA Assay (FDA approved for NASAL  specimens only), is one component of a comprehensive MRSA colonization surveillance program. It is not intended to diagnose MRSA infection nor to guide or monitor treatment for MRSA infections.   Urine culture     Status: None   Collection Time: 06/06/15 11:33 AM  Result Value Ref Range Status   Specimen Description URINE, CATHETERIZED  Final   Special Requests NONE  Final   Culture   Final    NO GROWTH 1 DAY Performed at [facility]    Report Status 06/07/2015 FINAL  Final     Studies: Dg Ankle Complete Right  06/10/2015  CLINICAL DATA:  Right anterior foot and ankle pain for 1 week.  EXAM: RIGHT ANKLE - COMPLETE 3+ VIEW  COMPARISON:  None.  FINDINGS: Mild degenerative changes in the right ankle. No acute bony abnormality. Specifically, no fracture, subluxation, or dislocation. Soft tissues are intact.  IMPRESSION: No acute bony abnormality.   Electronically Signed   By: Rolm Baptise M.D.   On: 06/10/2015 08:24   Dg Foot Complete Right  06/09/2015   CLINICAL DATA:  69 year old male with right-sided foot pain for the past 2-3 days. No known injury.  EXAM: RIGHT FOOT COMPLETE - 3+ VIEW  COMPARISON:  No priors.  FINDINGS: Degenerative changes of osteoarthritis are noted at the first MTP joint. No acute displaced fracture, subluxation or dislocation is noted. Hammertoe deformities of toes 2 through 5.  IMPRESSION: 1. No acute radiographic abnormality of the right foot.   Electronically Signed   By: Vinnie Langton M.D.   On: 06/09/2015 17:21    Scheduled Meds: . amLODipine  10 mg Oral Daily  . cefTRIAXone (ROCEPHIN)  IV  1 g Intravenous Q24H  . citalopram  20 mg Oral Daily  . gabapentin  300 mg Oral TID AC  . gabapentin  600 mg Oral QHS  . heparin  5,000 Units Subcutaneous 3 times per day  . metoprolol tartrate  50 mg Oral BID  . morphine  60 mg Oral Q8H  . polyethylene glycol  17 g Oral Daily  . senna-docusate  1 tablet Oral QHS  . sodium chloride  3 mL Intravenous  Q12H   Continuous Infusions:    Principal Problem:   Fever Active Problems:   Sepsis   HCAP (healthcare-associated pneumonia)   Chronic pain   DDD (degenerative disc disease), lumbar   Polypharmacy   Degenerative arthritis of thoracic spine   Toxic metabolic encephalopathy   Hyperkalemia   Constipation   Acute renal failure syndrome   Acute encephalopathy   Hypokalemia   Acute right ankle pain    Time spent: 40 mins    [facility] MD [facility] Pager [phone]. If 7PM-7AM, please contact night-coverage at [email or web address], password [facility] 06/11/2015, 11:41 AM  LOS: 6 days

## 2015-06-11 NOTE — Evaluation (Signed)
Physical Therapy Evaluation Patient Details Name: William Jordan MRN: [id number] DOB: April 28, 1946 Today's Date: 06/11/2015   History of Present Illness  Pt was admitted for fever/confusion and diagnosed with sepsis.  R ankle pain, xrays negative.  He has a h/o neuropathy and chronic pain.    Clinical Impression  Pt independently ambulated 170' in hall with RW without loss of balance. From PT standpoint he's ready to DC home. He states R foot pain has significantly decreased since admission. No further acute PT needed as pt is modified independent with activity.     Follow Up Recommendations No PT follow up    Equipment Recommendations  None recommended by PT    Recommendations for Other Services       Precautions / Restrictions Precautions Precautions: Fall Precaution Comments: 8-10 falls in past year, mostly related to dogs tripping him per pt (dog is now deceased) Restrictions Weight Bearing Restrictions: No      Mobility  Bed Mobility               General bed mobility comments: NT-up on EOB  Transfers Overall transfer level: Modified independent Equipment used: Rolling walker (2 wheeled) Transfers: Sit to/from Stand Sit to Stand: Modified independent (Device/Increase time)         General transfer comment: with RW  Ambulation/Gait   Ambulation Distance (Feet): 170 Feet Assistive device: Rolling walker (2 wheeled) Gait Pattern/deviations: WFL(Within Functional Limits)   Gait velocity interpretation: at or above normal speed for age/gender General Gait Details: steady, no LOB  Stairs            Wheelchair Mobility    Modified Rankin (Stroke Patients Only)       Balance Overall balance assessment: Modified Independent   Sitting balance-Leahy Scale: Good                                       Pertinent Vitals/Pain Pain Score: 1  Pain Location: R foot Pain Descriptors / Indicators: Sore Pain Intervention(s): Monitored during  session    Home Living Family/patient expects to be discharged to:: Private residence Living Arrangements: Spouse/significant other Available Help at Discharge: Family;Available 24 hours/day Type of Home: House Home Access: Stairs to enter Entrance Stairs-Rails: Right;Left;Can reach both Entrance Stairs-Number of Steps: 6 Home Layout: One level Home Equipment: Walker - 2 wheels;Cane - single point;Shower seat - built in;Grab bars - tub/shower;Hand held shower head      Prior Function Level of Independence: Independent with assistive device(s)         Comments: was using RW due to back pain     Hand Dominance        Extremity/Trunk Assessment   Upper Extremity Assessment: Defer to OT evaluation           Lower Extremity Assessment: Overall WFL for tasks assessed      Cervical / Trunk Assessment: Normal  Communication   Communication: No difficulties  Cognition Arousal/Alertness: Awake/alert Behavior During Therapy: WFL for tasks assessed/performed Overall Cognitive Status: Within Functional Limits for tasks assessed                      General Comments      Exercises        Assessment/Plan    PT Assessment Patent does not need any further PT services  PT Diagnosis     PT Problem List  PT Treatment Interventions     PT Goals (Current goals can be found in the Care Plan section) Acute Rehab PT Goals PT Goal Formulation: All assessment and education complete, DC therapy    Frequency     Barriers to discharge        Co-evaluation               End of Session Equipment Utilized During Treatment: Gait belt Activity Tolerance: Patient tolerated treatment well Patient left: in chair;with restraints reapplied Nurse Communication: Mobility status         Time: 7209-4709 PT Time Calculation (min) (ACUTE ONLY): 19 min   Charges:   PT Evaluation $Initial PT Evaluation Tier I: 1 Procedure     PT G CodesPhilomena Jordan 06/11/2015, 12:14 PM [phone]

## 2015-06-11 NOTE — Progress Notes (Signed)
Patient ID: William Jordan, male   DOB: 12/20/1945, 69 y.o.   MRN: [id number]         [facility]    Date of Admission:  06/05/2015   Total days of antibiotics 7        Day 3 ceftriaxone         Principal Problem:   Fever Active Problems:   Sepsis   Toxic metabolic encephalopathy   Chronic pain   DDD (degenerative disc disease), lumbar   Polypharmacy   Degenerative arthritis of thoracic spine   Hyperkalemia   Constipation   Acute renal failure syndrome   Acute encephalopathy   HCAP (healthcare-associated pneumonia)   Hypokalemia   Acute right ankle pain   . amLODipine  10 mg Oral Daily  . cefTRIAXone (ROCEPHIN)  IV  1 g Intravenous Q24H  . citalopram  20 mg Oral Daily  . colchicine  0.6 mg Oral BID  . gabapentin  300 mg Oral TID AC  . gabapentin  600 mg Oral QHS  . heparin  5,000 Units Subcutaneous 3 times per day  . metoprolol tartrate  50 mg Oral BID  . morphine  60 mg Oral Q8H  . polyethylene glycol  17 g Oral Daily  . senna-docusate  1 tablet Oral QHS  . sodium chloride  3 mL Intravenous Q12H    SUBJECTIVE: He is feeling much better and back to normal. He has no recall of the events leading to this admission. The last thing he remembers is drinking coffee and sitting at the table watching his morning news program. He has had a chronic cough intermittently productive of yellow to brown sputum and some chronic, stable dyspnea on exertion. His chronic back pain has been unchanged recently. He is eager to go home.  Review of Systems: Pertinent items are noted in HPI.  Past Medical History  Diagnosis Date  . Hypertension   . Anxiety     takes Celexa  . Enlarged prostate   . Neuropathy   . Arthritis   . History of blood transfusion     Social History  Substance Use Topics  . Smoking status: Current Every Day Smoker    Types: E-cigarettes  . Smokeless tobacco: Former Systems developer    Types: Snuff     Comment: does not use any tobacco  products any more  . Alcohol Use: No    Family History  Problem Relation Age of Onset  . Heart disease Father    No Known Allergies  OBJECTIVE: Filed Vitals:   06/10/15 2201 06/11/15 0523 06/11/15 0941 06/11/15 1510  BP: 151/86 153/90 152/82 139/76  Pulse: 74 66 67 70  Temp: 97.8 F (36.6 C) 98.1 F (36.7 C)  98.4 F (36.9 C)  TempSrc: Oral Oral  Oral  Resp: 20 20  20   Height:      Weight:      SpO2: 97% 100%  99%   Body mass index is 21.56 kg/(m^2).  General:  he is smiling, talkative and in good spirits. His wife is present. They are joking with each other. SkNo rash Lungs:  Clear CRegular S1 and S2 with no murmurs Abdomen:Soft and nontender Joints and extremities: No acute abnormalities. He no longer has any pain in his feet.   Lab Results Lab Results  Component Value Date   WBC 16.5* 06/11/2015   HGB 11.1* 06/11/2015   HCT 33.1* 06/11/2015   MCV 85.8 06/11/2015   PLT 412* 06/11/2015  Lab Results  Component Value Date   CREATININE 0.71 06/11/2015   BUN 18 06/11/2015   NA 135 06/11/2015   K 4.1 06/11/2015   CL 99* 06/11/2015   CO2 25 06/11/2015    Lab Results  Component Value Date   ALT 16* 06/06/2015   AST 23 06/06/2015   ALKPHOS 72 06/06/2015   BILITOT 0.9 06/06/2015     Microbiology: Recent Results (from the past 240 hour(s))  Culture, blood (routine x 2)     Status: None   Collection Time: 06/05/15 12:50 PM  Result Value Ref Range Status   Specimen Description BLOOD LEFT FOREARM  Final   Special Requests BOTTLES DRAWN AEROBIC AND ANAEROBIC 5CC EACH  Final   Culture   Final    NO GROWTH 5 DAYS Performed at [facility]    Report Status 06/10/2015 FINAL  Final  Culture, blood (routine x 2)     Status: None   Collection Time: 06/05/15  1:00 PM  Result Value Ref Range Status   Specimen Description BLOOD RIGHT HAND  Final   Special Requests BOTTLES DRAWN AEROBIC ONLY [place]  Final   Culture   Final    NO GROWTH 5 DAYS Performed at  [facility]    Report Status 06/10/2015 FINAL  Final  Urine culture     Status: None   Collection Time: 06/05/15  2:15 PM  Result Value Ref Range Status   Specimen Description URINE, CATHETERIZED  Final   Special Requests NONE  Final   Culture   Final    NO GROWTH 1 DAY Performed at [facility]    Report Status 06/06/2015 FINAL  Final  MRSA PCR Screening     Status: None   Collection Time: 06/05/15 11:51 PM  Result Value Ref Range Status   MRSA by PCR NEGATIVE NEGATIVE Final    Comment:        The GeneXpert MRSA Assay (FDA approved for NASAL specimens only), is one component of a comprehensive MRSA colonization surveillance program. It is not intended to diagnose MRSA infection nor to guide or monitor treatment for MRSA infections.   Urine culture     Status: None   Collection Time: 06/06/15 11:33 AM  Result Value Ref Range Status   Specimen Description URINE, CATHETERIZED  Final   Special Requests NONE  Final   Culture   Final    NO GROWTH 1 DAY Performed at [facility]    Report Status 06/07/2015 FINAL  Final     ASSESSMENT: The source of his recent fever and delirium is unclear to me. His CT scans showed some wispy, groundglass infiltrates in his upper lobes but he does not have any clinical evidence to suggest acute pneumonia. He has improved with empiric antibiotic therapy. I would recommend stopping ceftriaxone tomorrow and discharging home off of antibiotics. He needs no specific follow-up with [place] as long as he is doing well.  PLAN: 1. Recommend stopping ceftriaxone tomorrow and discharging home   Michel Bickers, MD [facility] for [place] [phone] pager   [phone] cell 06/11/2015, 5:09 PM

## 2015-06-12 LAB — BASIC METABOLIC PANEL
ANION GAP: 8 (ref 5–15)
BUN: 20 mg/dL (ref 6–20)
CHLORIDE: 100 mmol/L — AB (ref 101–111)
CO2: 26 mmol/L (ref 22–32)
CREATININE: 0.63 mg/dL (ref 0.61–1.24)
Calcium: 8.6 mg/dL — ABNORMAL LOW (ref 8.9–10.3)
GFR calc non Af Amer: >60 mL/min (ref >60)
Glucose, Bld: 99 mg/dL (ref 65–99)
POTASSIUM: 3.6 mmol/L (ref 3.5–5.1)
SODIUM: 134 mmol/L — AB (ref 135–145)

## 2015-06-12 LAB — CBC
HEMATOCRIT: 31.8 % — AB (ref 39.0–52.0)
HEMOGLOBIN: 10.6 g/dL — AB (ref 13.0–17.0)
MCH: 29.2 pg (ref 26.0–34.0)
MCHC: 33.3 g/dL (ref 30.0–36.0)
MCV: 87.6 fL (ref 78.0–100.0)
Platelets: 436 10*3/uL — ABNORMAL HIGH (ref 150–400)
RBC: 3.63 MIL/uL — AB (ref 4.22–5.81)
RDW: 13.8 % (ref 11.5–15.5)
WBC: 14.9 10*3/uL — AB (ref 4.0–10.5)

## 2015-06-12 MED ORDER — GABAPENTIN 300 MG PO CAPS: 600.0000 mg | ORAL_CAPSULE | Freq: Every day | ORAL | Status: DC

## 2015-06-12 MED ORDER — AMLODIPINE BESYLATE 10 MG PO TABS: 10.0000 mg | ORAL_TABLET | Freq: Every day | ORAL | Status: DC

## 2015-06-12 MED ORDER — COLCHICINE 0.6 MG PO TABS: 0.6000 mg | ORAL_TABLET | Freq: Two times a day (BID) | ORAL | Status: DC

## 2015-06-12 MED ORDER — LEVOFLOXACIN 750 MG PO TABS
750.0000 mg | ORAL_TABLET | Freq: Once | ORAL | Status: AC
Start: 2015-06-12 — End: 2015-06-12
  Administered 2015-06-12: 750 mg via ORAL
  Filled 2015-06-12: qty 1

## 2015-06-12 MED ORDER — SENNOSIDES-DOCUSATE SODIUM 8.6-50 MG PO TABS: 1.0000 | ORAL_TABLET | Freq: Every day | ORAL | Status: DC

## 2015-06-12 MED ORDER — GABAPENTIN 300 MG PO CAPS: 300.0000 mg | ORAL_CAPSULE | Freq: Three times a day (TID) | ORAL | Status: DC

## 2015-06-12 MED ORDER — POLYETHYLENE GLYCOL 3350 17 G PO PACK: 17.0000 g | PACK | Freq: Every day | ORAL | Status: DC

## 2015-06-12 MED ORDER — METOPROLOL TARTRATE 50 MG PO TABS: 50.0000 mg | ORAL_TABLET | Freq: Two times a day (BID) | ORAL | Status: DC

## 2015-06-12 NOTE — Discharge Summary (Signed)
Physician Discharge Summary  William Jordan MWU:[id number] DOB: Dec 11, 1945 DOA: 06/05/2015  PCP: [name], MD  Admit date: 06/05/2015 Discharge date: 06/12/2015  Time spent: 65 minutes  Recommendations for Outpatient Follow-up:  1. Follow up with PCP in 1 week. On Follow up patient will need BMET, CBC. Follow up on BP and probable gout of ankle.  Discharge Diagnoses:  Principal Problem:   Fever Active Problems:   Sepsis   HCAP (healthcare-associated pneumonia)   Chronic pain   DDD (degenerative disc disease), lumbar   Polypharmacy   Degenerative arthritis of thoracic spine   Toxic metabolic encephalopathy   Hyperkalemia   Constipation   Acute renal failure syndrome   Acute encephalopathy   Hypokalemia   Acute right ankle pain   Discharge Condition: stable and improved.  Diet recommendation: regular  Filed Weights   06/06/15 0500 06/07/15 0400 06/09/15 0520  Weight: 88.7 kg (195 lb 8.8 oz) 87.7 kg (193 lb 5.5 oz) 82.5 kg (181 lb 14.1 oz)    History of present illness:  Per Dr Melodie Bouillon is a 69 y.o. male with a past medical history chronic pain syndrome on narcotic analgesics, hypertension, neuropathy who was brought to the emergency department for mental status changes. On 05/24/2015 he was seen in the emergency department where he was given a prescription for ciprofloxacin for prostatitis. Then his wife reported that he was taken to [facility] on 05/27/2015 for urinary retention where he underwent in and out catheterization. He followed up with urology on 05/29/2015 where he was instructed to discontinue ciprofloxacin. Patient having a significant declineon the morning of admission,as he developed significant mental status changes characterized by acute confusion along with significant weakness. His wife called EMS where he was found to have a temperature of 102.4 on the field. In the emergency department he had a temperature of 101.5, with labs showing the  development of acute kidney injury with creatinine of 3.8 and BUN of 42. His potassium was also elevated at 6.9 and had a white count of 17,500. Patient was having abdominal discomfort on physical exam for which emergency room provider ordered CT scan of abdomen and pelvis. Results were pending at the time of this dictation. Family members were not present, patient could not provide history. Above history was obtained from emergency room staff and medical records.    Hospital Course:  #1 sepsis likely secondary to HCAP/Fever likely secondary to HCAP Patient presented on admission with a fever with a temp of 101.4, heart rate of 103, white count of 17.5 and in acute renal failure with a creatinine of 3.8 BUN of 42 with confusion and also noted to be borderline hypotensive. Source of infection likely HCAP per CT chest 06/08/2015. Chest x-ray was negative for any acute infiltrate. Urinalysis was negative. Blood cultures pending. Urine cultures pending. Repeat chest x-ray was negative. Repeat urinalysis with small leukocytes negative nitrites. Urine cultures pending. Patient with improvement with fever. Leukocytosis trended down. Patient was empirically started on IV Vancomycin and IV Zosyn on admission. Patient was seen in consultation by ID and IV Zosyn was changed to IV [place]. IV antibiotics were subsequently narrowed down to IV Rocephin per ID as patient improved. See #2. Patient improved clinically and was back to baseline by day of discharge.   #2 healthcare associated pneumonia Per CT of the chest. Fever curve trended down. Patient did have a productive cough of brown greenish sputum during the hospitalization. Sputum Gram stain and culture pending . Urine  Legionella antigen pending. Urine pneumococcus antigen is negative. Patient was placed empirically on IV antibiotics, Mucinex, scheduled nebulizers. IV antibiotics were narrowed down to IV Rocephin per ID.  Patient received a 8 day course of antibiotics during the hospitalization. ID followed during the hospitalization.   #2 acute renal failure Questionable etiology. May be secondary to a prerenal azotemia in the setting of ACE inhibitor and possible postobstructive nephropathy as patient does have a history of BPH. CT abdomen and pelvis was negative for hydronephrosis. Foley catheter was in place and subsequently has been discontinued. Patient with good urine output. Renal function improved. Creatinine on admission was 3.8 from 0.78 on 05/24/2015. Patient was seen in consultation by nephrology who recommended not resume an ACE inhibitor at least 6-8 weeks.   #3 hyperkalemia/ hypomagnesemia/hypokalemia Patient on admission was hyperkalemic likely secondary to acute renal failure. Patient is status post calcium gluconate, insulin and D50, Kayexalate with resolution of hyperkalemia. ACE inhibitor on hold. Patient was hypokalemic, likely due to increased urine output. Keep magnesium greater than 2. Repleted potassium.   #4 urinary retention May be secondary to BPH. Patient status post Foley catheter placement. Repeat UA with cultures and sensitivities pending. Patient on empiric IV antibiotics. Foley catheter has been removed and patient with good urine output. Follow.  #5 constipation Per CT abdomen and pelvis. Patient status post soapsuds enema with good bowel movement. Patient was placed on a bowel regimen of MiraLAX daily and Senokot S at bedtime, with resolution of constipation.  #6 acute encephalopathy Patient had presented with acute encephalopathy, with agiation. Acute encephalopathy likely multifactorial secondary to Withdrawal from MS Contin and Neurontin as patient's wife stated that patient suddenly stopped taking all his medications 1-2 weeks prior to admission. Also secondary to healthcare associated pneumonia noted on CT chest from 06/08/2015, and secondary to medication induced secondary  to Neurontin and pain medications/sedating medications in the setting of acute renal failure. Patient very agitated and delirious and confused and had to be placed in soft restraints during first few days of hospitalization. Patient with no improvement with soft restraints and was placed on Ativan as well as Haldol with no significant improvement with his agitation and delirium. Repeat chest x-ray negative for any acute infiltrate. Repeat urinalysis With small leukocytes nitrite negative. Urine cultures negative. Patient was subsequently placed on the Precedex drip and critical care followed the patient during Precedex drip as been titrated off. CT head negative. Patient was placed on empiric antibiotics and home pain regimen resumed as well as 1/2 home dose neurontin. Patient improved clinically and was back to baseline by day of discharge.   #7 hypertension Patient's ACE inhibitor was discontinued on admission secondary to ARF. Patient was placed on Norvasc 10 mg daily and lopressor added to his regimen. Outpatient follow up.  #8 chronic pain Continued home regimen of long-acting MS Contin of 60 mg every 8 hours. Continued on neurontin at 1/2 home dose.   #9 leukocytosis Likely secondary to healthcare associated pneumonia as noted on CT chest of 06/08/2015. WBC trended back up likely secondary to dose of IV steriods given for rpresumed gout. Patient was pancultured and results with no growth to date. Urine strep pneumococcus antigen is negative. Patient with fever curve trended down. Patient afebrile 48 hours. Sputum Gram stain and culture with no growth to date. Urine Legionella antigen pending. CT chest which was done consistent with infiltrate in the left upper lobe and right upper lobe. CT abdomen and pelvis with no significant source  for infection. Patient was seen in consultation by infectious diseases and antibiotic coverage was narrowed down from IV vancomycin and IV Fortaz to IV Rocephin.  See #1 and #6.  #10 right ankle pain likely gout Plain films of the right foot negative. Uric acid at 4.4. Patient however noted to have uric acid crystals in UA on 06/06/2015. No further foot pain after dose of steriods. Place on colchicine BID.Outpatient follow up.   Procedures:  CT abdomen and pelvis 06/05/2015  Chest x-ray 06/05/2015, 06/06/2015  CT head 06/07/2015  CT chest/abdomen/pelvis 06/08/2015  Plain films of the right foot 06/09/2015  Consultations:  Nephrology: Dr. Jonnie Finner 06/05/2015  Infectious diseases: Dr. Megan Salon 06/07/2015  PCCM: Dr. Titus Mould 06/07/2015    Discharge Exam: Filed Vitals:   06/12/15 0941  BP: 162/78  Pulse: 62  Temp:   Resp:     General: NAD Cardiovascular: RRR Respiratory: CTAB  Discharge Instructions   Discharge Instructions    Diet general    Complete by:  As directed      Discharge instructions    Complete by:  As directed   Follow up with PCP in 1 week.     Increase activity slowly    Complete by:  As directed           Current Discharge Medication List    START taking these medications   Details  colchicine (COLCRYS) 0.6 MG tablet Take 1 tablet (0.6 mg total) by mouth 2 (two) times daily. Qty: 62 tablet, Refills: 2    !! gabapentin (NEURONTIN) 300 MG capsule Take 1 capsule (300 mg total) by mouth 3 (three) times daily before meals. Qty: 90 capsule, Refills: 0    !! gabapentin (NEURONTIN) 300 MG capsule Take 2 capsules (600 mg total) by mouth at bedtime. Qty: 60 capsule, Refills: 0    metoprolol (LOPRESSOR) 50 MG tablet Take 1 tablet (50 mg total) by mouth 2 (two) times daily. Qty: 60 tablet, Refills: 0    polyethylene glycol (MIRALAX / GLYCOLAX) packet Take 17 g by mouth daily. Qty: 14 each, Refills: 0    senna-docusate (SENOKOT-S) 8.6-50 MG tablet Take 1 tablet by mouth at bedtime.     !! - Potential duplicate medications found. Please discuss with provider.    CONTINUE these medications which have  CHANGED   Details  amLODipine (NORVASC) 10 MG tablet Take 1 tablet (10 mg total) by mouth daily. Qty: 30 tablet, Refills: 0      CONTINUE these medications which have NOT CHANGED   Details  citalopram (CELEXA) 10 MG tablet Take 20 mg by mouth daily.    docusate sodium (COLACE) 100 MG capsule Take 1 capsule (100 mg total) by mouth 3 (three) times daily as needed for mild constipation. Qty: 30 capsule, Refills: 0    morphine (MS CONTIN) 60 MG 12 hr tablet Take 60 mg by mouth every 8 (eight) hours.    psyllium (METAMUCIL) 58.6 % packet Take 1 packet by mouth 2 (two) times daily.      STOP taking these medications     benazepril (LOTENSIN) 20 MG tablet      gabapentin (NEURONTIN) 600 MG tablet      ciprofloxacin (CIPRO) 500 MG tablet      methocarbamol (ROBAXIN) 500 MG tablet      ondansetron (ZOFRAN ODT) 4 MG disintegrating tablet      ondansetron (ZOFRAN) 4 MG tablet      oxyCODONE-acetaminophen (PERCOCET) 10-325 MG per tablet  polyethylene glycol powder (GLYCOLAX) powder        No Known Allergies Follow-up Information    Schedule an appointment as soon as possible for a visit in 1 week to follow up.   Why:  f/u in 1-2 weeks       The results of significant diagnostics from this hospitalization (including imaging, microbiology, ancillary and laboratory) are listed below for reference.    Significant Diagnostic Studies: Ct Abdomen Pelvis Wo Contrast  06/05/2015   CLINICAL DATA:  Possible stroke. Patient is febrile, confused, and dizzy. Recent urinary retention.  EXAM: CT ABDOMEN AND PELVIS WITHOUT CONTRAST  TECHNIQUE: Multidetector CT imaging of the abdomen and pelvis was performed following the standard protocol without IV contrast.  COMPARISON:  None.  FINDINGS: Lower chest: Mild bibasilar atelectasis, right greater than left. Significant coronary artery disease. Heart size is normal.  Upper abdomen: No focal abnormality within the liver, spleen, pancreas, or  adrenal glands. Within the lower pole of the right kidney there is a hyperdense 1.9 cm lesion possibly representing a cyst but not characterized on this noncontrast exam. A 1.2 cm low-attenuation lesion is identified in the upper pole left kidney, likely representing a cyst. The gallbladder is present.  Gastrointestinal tract: The stomach and small bowel loops are normal in appearance. Large stool burden. The appendix is not seen.  Pelvis: There is significant streak artifact from right hip orthopedic hardware an electronic device in the right buttock for spinal stimulator. A Foley catheter is identified within the urinary bladder, decompressing the bladder. There is no free pelvic fluid.  Retroperitoneum: Significant atherosclerotic calcification of the abdominal aorta. No aneurysm. No retroperitoneal or mesenteric adenopathy.  Abdominal wall: Unremarkable.  Osseous structures: Right hip arthroplasty. Wedge compression deformity of L2, L1, and T12. Significant degenerative changes throughout the spine. No suspicious lytic or blastic lesions are identified.  IMPRESSION: 1.  No evidence for acute  abnormality. 2. Significant coronary artery disease. 3. Hyperdense lesion within the lower pole of the right kidney, not characterized on this exam. Consider further evaluation with contrast-enhanced exam. MRI should be performed when the patient is clinically stable and able to follow breath holding instructions (usually best performed on an outpatient basis). 4. Large stool burden. 5. Appendix not seen. 6. Foley catheter decompresses the bladder. 7. Numerous wedge compression fractures in the lower thoracic and lumbar spine.   Electronically Signed   By: Nolon Nations M.D.   On: 06/05/2015 18:48   Dg Chest 2 View  05/24/2015   CLINICAL DATA:  Shortness of Breath  EXAM: CHEST  2 VIEW  COMPARISON:  None.  FINDINGS: There is no edema or consolidation. The heart size and pulmonary vascularity are normal. No adenopathy.  There is a stimulator with the tip in the mid thoracic region. No pneumothorax. There is a total shoulder replacement on the left. There are multiple wedge compression fractures of the lower thoracic and upper lumbar spine regions. There is evidence of old healed fracture of the right clavicle. There old healed fractures of the left posterior fourth, fifth, and sixth ribs. Bones appear osteoporotic.  IMPRESSION: No edema or consolidation. Bones osteoporotic. Heart size within normal limits.   Electronically Signed   By: Lowella Grip III M.D.   On: 05/24/2015 13:31   Dg Ankle Complete Right  06/10/2015   CLINICAL DATA:  Right anterior foot and ankle pain for 1 week.  EXAM: RIGHT ANKLE - COMPLETE 3+ VIEW  COMPARISON:  None.  FINDINGS: Mild degenerative  changes in the right ankle. No acute bony abnormality. Specifically, no fracture, subluxation, or dislocation. Soft tissues are intact.  IMPRESSION: No acute bony abnormality.   Electronically Signed   By: Rolm Baptise M.D.   On: 06/10/2015 08:24   Ct Head Wo Contrast  06/07/2015   CLINICAL DATA:  Encephalopyosis  EXAM: CT HEAD WITHOUT CONTRAST  TECHNIQUE: Contiguous axial images were obtained from the base of the skull through the vertex without intravenous contrast.  COMPARISON:  None.  FINDINGS: Ventricles and sulci are appropriate for patient's age. No evidence for acute cortically based infarct, intracranial hemorrhage, mass lesion or mass-effect. The orbits are unremarkable. Paranasal sinuses are unremarkable. Mastoid air cells are well aerated. Calvarium is intact.  IMPRESSION: No acute intracranial process.   Electronically Signed   By: Lovey Newcomer M.D.   On: 06/07/2015 15:44   Ct Chest W Contrast  06/08/2015   CLINICAL DATA:  Fever of unknown origin. Frequent small loose dark green stools with mucus. Abdominal pain.  EXAM: CT CHEST, ABDOMEN, AND PELVIS WITH CONTRAST  TECHNIQUE: Multidetector CT imaging of the chest, abdomen and pelvis was  performed following the standard protocol during bolus administration of intravenous contrast.  CONTRAST:  8mL OMNIPAQUE IOHEXOL 300 MG/ML SOLN, 184mL OMNIPAQUE IOHEXOL 300 MG/ML SOLN  COMPARISON:  CT of the abdomen and pelvis on 06/05/2015  FINDINGS: CT CHEST FINDINGS  Heart: Heart size is normal. Significant coronary artery calcification. No pericardial effusion.  Vascular structures: There is atherosclerotic calcification of the thoracic aorta. Aorta is tortuous but not aneurysmal. Note is made of bovine arch anatomy.  Mediastinum/thyroid: There is a 1.5 cm low-attenuation nodule within the left lobe of the thyroid. Small mediastinal lymph nodes are identified. Small right peritracheal lymph node is 1.1 cm. Subcarinal lymph node is 1.5 cm. Small hiatal hernia is present.  Lungs/Airways: There is are focal areas of infiltrate within the left upper lobe and right upper lobe, likely infectious. There are scattered emphysematous changes. Small bilateral pleural effusions, right greater than left.  Chest wall/osseous: Significant degenerative changes are identified in the thoracic spine. Spinal stimulator identified with tip at T7. No suspicious lytic or blastic lesions are identified. Chronic compression fracture of T12.  CT ABDOMEN AND PELVIS FINDINGS  Upper abdomen: Small amount of fluid is identified around the liver. No focal abnormality identified within the liver, spleen, pancreas, or adrenal glands. Small cyst is identified within the upper pole of the left kidney. No hydronephrosis. Right kidney is unremarkable in appearance. Gallbladder is distended without radiopaque calculi identified.  Gastrointestinal tract: The stomach and small bowel loops are normal in appearance. Colonic loops are normal in appearance. The appendix is not seen.  Pelvis: The urinary bladder is distended and contains a small amount of following recent Foley catheter. There has been development of a small amount of nonspecific fluid  within the pelvis, identified within the pericolic gutters bilaterally and surrounding the base the bladder.  Retroperitoneum: There is atherosclerotic calcification of the abdominal aorta. Aorta is tortuous but not aneurysmal. No retroperitoneal or mesenteric adenopathy.  Abdominal wall: Unremarkable.  Osseous structures: Status post right hip arthroplasty. Significant degenerative changes in the lumbar spine. Wedge compression fracture of L1 and L2 chronic.  IMPRESSION: 1. Focal areas of infiltrate within the left upper lobe and right upper lobe. Findings are likely infectious. 2. Interval development of a small amount of ascites and pelvic fluid. 3. Distended gallbladder without CT evidence for radiopaque calculi. 4. No bowel obstruction. 5. Significant  coronary artery disease. 6. 1.5 cm nodule within the left lobe of the thyroid. Elective thyroid ultrasound is recommended based on consensus criteria. 7. Small hiatal hernia. 8. Spinal stimulator. 9. Numerous stable wedge compression fractures.   Electronically Signed   By: Nolon Nations M.D.   On: 06/08/2015 19:14   Ct Abdomen Pelvis W Contrast  06/08/2015   CLINICAL DATA:  Fever of unknown origin. Frequent small loose dark green stools with mucus. Abdominal pain.  EXAM: CT CHEST, ABDOMEN, AND PELVIS WITH CONTRAST  TECHNIQUE: Multidetector CT imaging of the chest, abdomen and pelvis was performed following the standard protocol during bolus administration of intravenous contrast.  CONTRAST:  65mL OMNIPAQUE IOHEXOL 300 MG/ML SOLN, 168mL OMNIPAQUE IOHEXOL 300 MG/ML SOLN  COMPARISON:  CT of the abdomen and pelvis on 06/05/2015  FINDINGS: CT CHEST FINDINGS  Heart: Heart size is normal. Significant coronary artery calcification. No pericardial effusion.  Vascular structures: There is atherosclerotic calcification of the thoracic aorta. Aorta is tortuous but not aneurysmal. Note is made of bovine arch anatomy.  Mediastinum/thyroid: There is a 1.5 cm  low-attenuation nodule within the left lobe of the thyroid. Small mediastinal lymph nodes are identified. Small right peritracheal lymph node is 1.1 cm. Subcarinal lymph node is 1.5 cm. Small hiatal hernia is present.  Lungs/Airways: There is are focal areas of infiltrate within the left upper lobe and right upper lobe, likely infectious. There are scattered emphysematous changes. Small bilateral pleural effusions, right greater than left.  Chest wall/osseous: Significant degenerative changes are identified in the thoracic spine. Spinal stimulator identified with tip at T7. No suspicious lytic or blastic lesions are identified. Chronic compression fracture of T12.  CT ABDOMEN AND PELVIS FINDINGS  Upper abdomen: Small amount of fluid is identified around the liver. No focal abnormality identified within the liver, spleen, pancreas, or adrenal glands. Small cyst is identified within the upper pole of the left kidney. No hydronephrosis. Right kidney is unremarkable in appearance. Gallbladder is distended without radiopaque calculi identified.  Gastrointestinal tract: The stomach and small bowel loops are normal in appearance. Colonic loops are normal in appearance. The appendix is not seen.  Pelvis: The urinary bladder is distended and contains a small amount of following recent Foley catheter. There has been development of a small amount of nonspecific fluid within the pelvis, identified within the pericolic gutters bilaterally and surrounding the base the bladder.  Retroperitoneum: There is atherosclerotic calcification of the abdominal aorta. Aorta is tortuous but not aneurysmal. No retroperitoneal or mesenteric adenopathy.  Abdominal wall: Unremarkable.  Osseous structures: Status post right hip arthroplasty. Significant degenerative changes in the lumbar spine. Wedge compression fracture of L1 and L2 chronic.  IMPRESSION: 1. Focal areas of infiltrate within the left upper lobe and right upper lobe. Findings are  likely infectious. 2. Interval development of a small amount of ascites and pelvic fluid. 3. Distended gallbladder without CT evidence for radiopaque calculi. 4. No bowel obstruction. 5. Significant coronary artery disease. 6. 1.5 cm nodule within the left lobe of the thyroid. Elective thyroid ultrasound is recommended based on consensus criteria. 7. Small hiatal hernia. 8. Spinal stimulator. 9. Numerous stable wedge compression fractures.   Electronically Signed   By: Nolon Nations M.D.   On: 06/08/2015 19:14   Dg Chest Port 1 View  06/06/2015   CLINICAL DATA:  Sepsis.  Altered mental status.  EXAM: PORTABLE CHEST - 1 VIEW  COMPARISON:  06/05/2015  FINDINGS: Dorsal column stimulator remains in place.  Left humeral  prosthesis.  Atherosclerotic aortic arch. Borderline enlargement of the cardiopericardial silhouette upper zone pulmonary vascular prominence may be due to positioning.  Right mid clavicular deformity from old fracture. Degenerative right glenohumeral arthropathy.  Thoracic spondylosis. Low lung volumes are present, causing crowding of the pulmonary vasculature.  IMPRESSION: 1. Borderline enlargement of the cardiopericardial silhouette. Cephalization of blood flow could be from pulmonary venous hypertension or supine positioning. 2. Atherosclerotic aortic arch. 3. Low lung volumes.   Electronically Signed   By: Van Clines M.D.   On: 06/06/2015 10:46   Dg Chest Port 1 View  06/05/2015   CLINICAL DATA:  Sepsis  EXAM: PORTABLE CHEST - 1 VIEW  COMPARISON:  05/24/2015  FINDINGS: There is vascular congestion. Low lung volumes with bibasilar atelectasis. No effusions. Heart is upper limits normal in size. No acute bony abnormality.  IMPRESSION: Vascular congestion with low volumes and bibasilar atelectasis.   Electronically Signed   By: Rolm Baptise M.D.   On: 06/05/2015 15:18   Dg Foot Complete Right  06/09/2015   CLINICAL DATA:  69 year old male with right-sided foot pain for the past 2-3  days. No known injury.  EXAM: RIGHT FOOT COMPLETE - 3+ VIEW  COMPARISON:  No priors.  FINDINGS: Degenerative changes of osteoarthritis are noted at the first MTP joint. No acute displaced fracture, subluxation or dislocation is noted. Hammertoe deformities of toes 2 through 5.  IMPRESSION: 1. No acute radiographic abnormality of the right foot.   Electronically Signed   By: Vinnie Langton M.D.   On: 06/09/2015 17:21    Microbiology: Recent Results (from the past 240 hour(s))  Culture, blood (routine x 2)     Status: None   Collection Time: 06/05/15 12:50 PM  Result Value Ref Range Status   Specimen Description BLOOD LEFT FOREARM  Final   Special Requests BOTTLES DRAWN AEROBIC AND ANAEROBIC 5CC EACH  Final   Culture   Final    NO GROWTH 5 DAYS Performed at [facility]    Report Status 06/10/2015 FINAL  Final  Culture, blood (routine x 2)     Status: None   Collection Time: 06/05/15  1:00 PM  Result Value Ref Range Status   Specimen Description BLOOD RIGHT HAND  Final   Special Requests BOTTLES DRAWN AEROBIC ONLY [place]  Final   Culture   Final    NO GROWTH 5 DAYS Performed at [facility]    Report Status 06/10/2015 FINAL  Final  Urine culture     Status: None   Collection Time: 06/05/15  2:15 PM  Result Value Ref Range Status   Specimen Description URINE, CATHETERIZED  Final   Special Requests NONE  Final   Culture   Final    NO GROWTH 1 DAY Performed at [facility]    Report Status 06/06/2015 FINAL  Final  MRSA PCR Screening     Status: None   Collection Time: 06/05/15 11:51 PM  Result Value Ref Range Status   MRSA by PCR NEGATIVE NEGATIVE Final    Comment:        The GeneXpert MRSA Assay (FDA approved for NASAL specimens only), is one component of a comprehensive MRSA colonization surveillance program. It is not intended to diagnose MRSA infection nor to guide or monitor treatment for MRSA infections.   Urine culture     Status: None    Collection Time: 06/06/15 11:33 AM  Result Value Ref Range Status   Specimen Description URINE, CATHETERIZED  Final   Special Requests NONE  Final   Culture   Final    NO GROWTH 1 DAY Performed at [facility]    Report Status 06/07/2015 FINAL  Final     Labs: Basic Metabolic Panel:  Recent Labs Lab 06/07/15 0410 06/08/15 0347 06/09/15 0345 06/10/15 0334 06/11/15 0547 06/12/15 0454  NA 132* 132* 136 134* 135 134*  K 4.0 3.4* 3.0* 4.0 4.1 3.6  CL 100* 100* 99* 98* 99* 100*  CO2 24 24 27 26 25 26   GLUCOSE 103* 116* 108* 103* 132* 99  BUN 10 9 <5* 6 18 20   CREATININE 0.69 0.67 0.59* 0.64 0.71 0.63  CALCIUM 8.1* 7.8* 8.6* 8.7* 9.1 8.6*  MG 1.5* 1.7 1.6* 1.8  --   --    Liver Function Tests:  Recent Labs Lab 06/06/15 0600  AST 23  ALT 16*  ALKPHOS 72  BILITOT 0.9  PROT 5.9*  ALBUMIN 3.1*   No results for input(s): LIPASE, AMYLASE in the last 168 hours. No results for input(s): AMMONIA in the last 168 hours. CBC:  Recent Labs Lab 06/05/15 2053  06/07/15 0410 06/08/15 0347 06/09/15 0345 06/10/15 0334 06/11/15 0547 06/12/15 0454  WBC 16.7*  < > 16.1* 13.3* 11.8* 11.9* 16.5* 14.9*  NEUTROABS 13.8*  --  12.9* 10.2*  --   --   --   --   HGB 8.9*  < > 9.1* 9.2* 10.8* 10.7* 11.1* 10.6*  HCT 27.9*  < > 27.1* 27.5* 31.9* 32.5* 33.1* 31.8*  MCV 89.7  < > 88.9 87.3 86.7 87.4 85.8 87.6  PLT 274  < > 260 274 343 368 412* 436*  < > = values in this interval not displayed. Cardiac Enzymes: No results for input(s): CKTOTAL, CKMB, CKMBINDEX, TROPONINI in the last 168 hours. BNP: BNP (last 3 results) No results for input(s): BNP in the last 8760 hours.  ProBNP (last 3 results) No results for input(s): PROBNP in the last 8760 hours.  CBG:  Recent Labs Lab 06/05/15 1912  GLUCAP 49*       SignedIrine Seal MD [facility] 06/12/2015, 2:45 PM

## 2015-06-12 NOTE — Progress Notes (Signed)
Spoke with pt concerning Home Health needs.  Pt declined [facility] at present time, he states, "my wife is at home with me and she was in the medical field."

## 2015-06-12 NOTE — Progress Notes (Signed)
D/C to home via W/c w/ wife  Voices no c/o

## 2015-06-13 LAB — GLUCOSE, CAPILLARY: Glucose-Capillary: 113 mg/dL — ABNORMAL HIGH (ref 65–99)

## 2015-06-15 ENCOUNTER — Emergency Department (HOSPITAL_COMMUNITY): Payer: Medicare Other

## 2015-06-15 ENCOUNTER — Encounter (HOSPITAL_COMMUNITY): Payer: Self-pay

## 2015-06-15 ENCOUNTER — Inpatient Hospital Stay (HOSPITAL_COMMUNITY)
Admission: EM | Admit: 2015-06-15 | Discharge: 2015-06-18 | DRG: 554 | Disposition: A | Discharge status: Home / Self Care | Payer: Medicare Other | Attending: Family Medicine | Admitting: Family Medicine

## 2015-06-15 DIAGNOSIS — Z8249 Family history of ischemic heart disease and other diseases of the circulatory system: Secondary | ICD-10-CM

## 2015-06-15 DIAGNOSIS — N400 Enlarged prostate without lower urinary tract symptoms: Secondary | ICD-10-CM | POA: Diagnosis present

## 2015-06-15 DIAGNOSIS — M009 Pyogenic arthritis, unspecified: Secondary | ICD-10-CM | POA: Diagnosis present

## 2015-06-15 DIAGNOSIS — M109 Gout, unspecified: Secondary | ICD-10-CM

## 2015-06-15 DIAGNOSIS — Z96612 Presence of left artificial shoulder joint: Secondary | ICD-10-CM | POA: Diagnosis present

## 2015-06-15 DIAGNOSIS — M10032 Idiopathic gout, left wrist: Principal | ICD-10-CM | POA: Diagnosis present

## 2015-06-15 DIAGNOSIS — D72828 Other elevated white blood cell count: Secondary | ICD-10-CM | POA: Diagnosis present

## 2015-06-15 DIAGNOSIS — M25532 Pain in left wrist: Secondary | ICD-10-CM | POA: Insufficient documentation

## 2015-06-15 DIAGNOSIS — Z8679 Personal history of other diseases of the circulatory system: Secondary | ICD-10-CM | POA: Diagnosis present

## 2015-06-15 DIAGNOSIS — Z79899 Other long term (current) drug therapy: Secondary | ICD-10-CM

## 2015-06-15 DIAGNOSIS — F419 Anxiety disorder, unspecified: Secondary | ICD-10-CM | POA: Diagnosis present

## 2015-06-15 DIAGNOSIS — Z96652 Presence of left artificial knee joint: Secondary | ICD-10-CM | POA: Diagnosis present

## 2015-06-15 DIAGNOSIS — G629 Polyneuropathy, unspecified: Secondary | ICD-10-CM | POA: Diagnosis present

## 2015-06-15 DIAGNOSIS — I10 Essential (primary) hypertension: Secondary | ICD-10-CM | POA: Diagnosis present

## 2015-06-15 DIAGNOSIS — F17200 Nicotine dependence, unspecified, uncomplicated: Secondary | ICD-10-CM | POA: Diagnosis present

## 2015-06-15 DIAGNOSIS — Z96641 Presence of right artificial hip joint: Secondary | ICD-10-CM | POA: Diagnosis present

## 2015-06-15 DIAGNOSIS — G9340 Encephalopathy, unspecified: Secondary | ICD-10-CM | POA: Diagnosis present

## 2015-06-15 DIAGNOSIS — T380X5A Adverse effect of glucocorticoids and synthetic analogues, initial encounter: Secondary | ICD-10-CM | POA: Diagnosis present

## 2015-06-15 DIAGNOSIS — D72829 Elevated white blood cell count, unspecified: Secondary | ICD-10-CM | POA: Diagnosis present

## 2015-06-15 LAB — CBC WITH DIFFERENTIAL/PLATELET
BASOS ABS: 0.1 10*3/uL (ref 0.0–0.1)
BASOS PCT: 0 %
EOS ABS: 0.4 10*3/uL (ref 0.0–0.7)
Eosinophils Relative: 2 %
HEMATOCRIT: 34.7 % — AB (ref 39.0–52.0)
HEMOGLOBIN: 11 g/dL — AB (ref 13.0–17.0)
Lymphocytes Relative: 9 %
Lymphs Abs: 2 10*3/uL (ref 0.7–4.0)
MCH: 28.2 pg (ref 26.0–34.0)
MCHC: 31.7 g/dL (ref 30.0–36.0)
MCV: 89 fL (ref 78.0–100.0)
Monocytes Absolute: 2.3 10*3/uL — ABNORMAL HIGH (ref 0.1–1.0)
Monocytes Relative: 11 %
NEUTROS ABS: 16.4 10*3/uL — AB (ref 1.7–7.7)
NEUTROS PCT: 78 %
Platelets: 414 10*3/uL — ABNORMAL HIGH (ref 150–400)
RBC: 3.9 MIL/uL — AB (ref 4.22–5.81)
RDW: 14 % (ref 11.5–15.5)
WBC: 21.1 10*3/uL — AB (ref 4.0–10.5)

## 2015-06-15 LAB — COMPREHENSIVE METABOLIC PANEL
ALK PHOS: 83 U/L (ref 38–126)
ALT: 27 U/L (ref 17–63)
ANION GAP: 10 (ref 5–15)
AST: 23 U/L (ref 15–41)
Albumin: 3.4 g/dL — ABNORMAL LOW (ref 3.5–5.0)
BUN: 19 mg/dL (ref 6–20)
CALCIUM: 8.6 mg/dL — AB (ref 8.9–10.3)
CO2: 24 mmol/L (ref 22–32)
CREATININE: 1.75 mg/dL — AB (ref 0.61–1.24)
Chloride: 98 mmol/L — ABNORMAL LOW (ref 101–111)
GFR, EST AFRICAN AMERICAN: 45 mL/min — AB (ref >60)
GFR, EST NON AFRICAN AMERICAN: 39 mL/min — AB (ref >60)
Glucose, Bld: 116 mg/dL — ABNORMAL HIGH (ref 65–99)
Potassium: 4.6 mmol/L (ref 3.5–5.1)
SODIUM: 132 mmol/L — AB (ref 135–145)
Total Bilirubin: 0.3 mg/dL (ref 0.3–1.2)
Total Protein: 6.5 g/dL (ref 6.5–8.1)

## 2015-06-15 LAB — I-STAT CG4 LACTIC ACID, ED
LACTIC ACID, VENOUS: 1.23 mmol/L (ref 0.5–2.0)
LACTIC ACID, VENOUS: 0.47 mmol/L — AB (ref 0.5–2.0)

## 2015-06-15 MED ORDER — MORPHINE SULFATE (PF) 4 MG/ML IV SOLN
4.0000 mg | Freq: Once | INTRAVENOUS | Status: AC
  Administered 2015-06-15: 4 mg via INTRAVENOUS
  Filled 2015-06-15: qty 1

## 2015-06-15 MED ORDER — LIDOCAINE HCL (PF) 1 % IJ SOLN
30.0000 mL | Freq: Once | INTRAMUSCULAR | Status: AC
  Administered 2015-06-16: 30 mL via INTRADERMAL
  Filled 2015-06-15: qty 30

## 2015-06-15 NOTE — ED Provider Notes (Signed)
CSN: [id number]     Arrival date & time 06/15/15  2019 History  By signing my name below, I, Starleen Arms, attest that this documentation has been prepared under the direction and in the presence of Julianne Rice, MD. Electronically Signed: Starleen Arms, ED Scribe. 06/15/2015. 11:37 PM.    Chief Complaint  Patient presents with  . Fever    The history is provided by the patient and the spouse. No language interpreter was used.   HPI Comments: William Jordan is a 69 y.o. male who presents to the Emergency Department complaining of drowsiness onset today.  The patient reports he was seen in the ED on 9/20 for AMS and was ultimately admitted until discharge two days ago with fever felt to be due to possible pneumonia.  During that admission, he was given IV antibiotics for sepsis with unknown source.  Since discharge, he reports general improvement in his condition.   1 week prior to the admission, the patient was seen in the ED, dx'd with prostatitis, and prescribed ciprofloxacin.  The ciprofloxacin was discontinued after f/u with [facility] and he reports general improvement until his admission. Patient is currently prescribed morphine for neuropathy with no recent changes in medication or dosage.  Patient denies hx of gout and is right handed.  He denies cough, abdominal pain.  Patient states stay he's had left wrist swelling, redness and pain. Was restrained with wrist restraints when hospitalized but reported no pain during that time. Past Medical History  Diagnosis Date  . Hypertension   . Anxiety     takes Celexa  . Enlarged prostate   . Neuropathy   . Arthritis   . History of blood transfusion    Past Surgical History  Procedure Laterality Date  . Joint replacement Left     knee, x 2  . Joint replacement Right     hip  . Joint replacement Left     partial shoulder  . Elbow surgery Right   . Hernia repair      umbilical  . Tonsillectomy    . Vasectomy    . Colonoscopy       one polyp removed - benign  . Spinal cord stimulator insertion N/A 09/28/2014    Procedure: LUMBAR SPINAL CORD STIMULATOR INSERTION;  Surgeon: Melina Schools, MD;  Location: [place];  Service: Orthopedics;  Laterality: N/A;   Family History  Problem Relation Age of Onset  . Heart disease Father    Social History  Substance Use Topics  . Smoking status: Current Every Day Smoker    Types: E-cigarettes  . Smokeless tobacco: Former Systems developer    Types: Snuff     Comment: does not use any tobacco products any more  . Alcohol Use: No    Review of Systems  Constitutional: Positive for fever and fatigue. Negative for chills.  Respiratory: Negative for cough.   Gastrointestinal: Negative for nausea, vomiting, abdominal pain and diarrhea.  Genitourinary: Negative for dysuria and frequency.  Musculoskeletal: Positive for arthralgias. Negative for neck pain and neck stiffness.  Skin: Positive for color change. Negative for rash and wound.  Neurological: Positive for weakness (generalized). Negative for dizziness and numbness.  All other systems reviewed and are negative.     Allergies  Review of patient's allergies indicates no known allergies.  Home Medications   Prior to Admission medications   Medication Sig Start Date End Date Taking? Authorizing Provider  amLODipine (NORVASC) 10 MG tablet Take 1 tablet (10 mg  total) by mouth daily. 06/12/15  Yes Eugenie Filler, MD  citalopram (CELEXA) 10 MG tablet Take 20 mg by mouth daily.   Yes Historical Provider, MD  colchicine (COLCRYS) 0.6 MG tablet Take 1 tablet (0.6 mg total) by mouth 2 (two) times daily. 06/12/15  Yes Eugenie Filler, MD  docusate sodium (COLACE) 100 MG capsule Take 1 capsule (100 mg total) by mouth 3 (three) times daily as needed for mild constipation. 09/28/14  Yes Melina Schools, MD  gabapentin (NEURONTIN) 300 MG capsule Take 1 capsule (300 mg total) by mouth 3 (three) times daily before meals. 06/12/15  Yes Eugenie Filler,  MD  gabapentin (NEURONTIN) 300 MG capsule Take 2 capsules (600 mg total) by mouth at bedtime. 06/12/15  Yes Eugenie Filler, MD  metoprolol (LOPRESSOR) 50 MG tablet Take 1 tablet (50 mg total) by mouth 2 (two) times daily. 06/12/15  Yes Eugenie Filler, MD  morphine (MS CONTIN) 60 MG 12 hr tablet Take 60 mg by mouth every 8 (eight) hours.   Yes Historical Provider, MD  polyethylene glycol (MIRALAX / GLYCOLAX) packet Take 17 g by mouth daily. 06/12/15  Yes Eugenie Filler, MD  psyllium (METAMUCIL) 58.6 % packet Take 1 packet by mouth 2 (two) times daily.   Yes Historical Provider, MD  senna-docusate (SENOKOT-S) 8.6-50 MG tablet Take 1 tablet by mouth at bedtime. 06/12/15  Yes Eugenie Filler, MD  tamsulosin (FLOMAX) 0.4 MG CAPS capsule Take 0.4 mg by mouth daily.   Yes Historical Provider, MD   BP 112/57 mmHg  Pulse 79  Temp(Src) 100.4 F (38 C) (Rectal)  Resp 12  Ht 5\' 10"  (1.778 m)  Wt 181 lb 14.4 oz (82.509 kg)  BMI 26.10 kg/m2  SpO2 95% Physical Exam  Constitutional: He is oriented to person, place, and time. He appears well-developed and well-nourished. No distress.  HENT:  Head: Normocephalic and atraumatic.  Mouth/Throat: Oropharynx is clear and moist. No oropharyngeal exudate.  Pinpoint pupils bilaterally.  Eyes: EOM are normal. Pupils are equal, round, and reactive to light.  Neck: Normal range of motion. Neck supple.  No meningismus  Cardiovascular: Normal rate and regular rhythm.  Exam reveals no gallop and no friction rub.   No murmur heard. Pulmonary/Chest: Effort normal and breath sounds normal. No respiratory distress. He has no wheezes. He has no rales. He exhibits no tenderness.  Abdominal: Soft. Bowel sounds are normal. He exhibits no distension and no mass. There is no tenderness. There is no rebound and no guarding.  Musculoskeletal: He exhibits tenderness. He exhibits no edema.  Left wrist diffuse swelling, warmth and erythema. Decreased range of motion due to  pain.  Neurological: He is alert and oriented to person, place, and time.  Moves all extremities without deficit. Sensation intact.  Skin: Skin is warm and dry. No rash noted. No erythema.  Psychiatric: He has a normal mood and affect. His behavior is normal.  Nursing note and vitals reviewed.   ED Course  ARTHOCENTESIS Date/Time: 06/16/2015 12:53 AM Performed by: Julianne Rice Authorized by: Julianne Rice Consent: Verbal consent obtained. Patient identity confirmed: verbally with patient Indications: joint swelling,  pain and possible septic joint  Body area: wrist Local anesthesia used: yes Local anesthetic: lidocaine 1% without epinephrine Anesthetic total: 3 ml Patient sedated: no Needle gauge: 20 G Ultrasound guidance: no Approach: posterior Aspirate: purulent Aspirate amount: 1 mL Patient tolerance: Patient tolerated the procedure well with no immediate complications   (including critical care  time)  DIAGNOSTIC STUDIES: Oxygen Saturation is 96% on RA, adequate by my interpretation.    COORDINATION OF CARE:  11:38 PM Discussed treatment plan with patient at bedside.  Patient acknowledges and agrees with plan.    Labs Review Labs Reviewed  COMPREHENSIVE METABOLIC PANEL - Abnormal; Notable for the following:    Sodium 132 (*)    Chloride 98 (*)    Glucose, Bld 116 (*)    Creatinine, Ser 1.75 (*)    Calcium 8.6 (*)    Albumin 3.4 (*)    GFR calc non Af Amer 39 (*)    GFR calc Af Amer 45 (*)    All other components within normal limits  CBC WITH DIFFERENTIAL/PLATELET - Abnormal; Notable for the following:    WBC 21.1 (*)    RBC 3.90 (*)    Hemoglobin 11.0 (*)    HCT 34.7 (*)    Platelets 414 (*)    Neutro Abs 16.4 (*)    Monocytes Absolute 2.3 (*)    All other components within normal limits  I-STAT CG4 LACTIC ACID, ED - Abnormal; Notable for the following:    Lactic Acid, Venous 0.47 (*)    All other components within normal limits  BODY FLUID  CULTURE  CULTURE, BLOOD (ROUTINE X 2)  CULTURE, BLOOD (ROUTINE X 2)  URINE CULTURE  URIC ACID  URINALYSIS, ROUTINE W REFLEX MICROSCOPIC (NOT AT [facility])  I-STAT CG4 LACTIC ACID, ED    Imaging Review Dg Chest 2 View  06/15/2015   CLINICAL DATA:  Shortness of breath and fever. Diagnosed with pneumonia 1 week ago  EXAM: CHEST  2 VIEW  COMPARISON:  06/05/2015  FINDINGS: Normal heart size and stable aortic tortuosity.  Previously seen pulmonary edema is resolved. Airspace opacity on chest CT 7 days ago is not visualized. There is no effusion or pneumothorax.  Remote mid right clavicle and L2 body fractures. Left glenohumeral arthroplasty. Dorsal column stimulator.  IMPRESSION: No evidence of active cardiopulmonary disease.   Electronically Signed   By: Monte Fantasia M.D.   On: 06/15/2015 21:43   Dg Wrist Complete Left  06/15/2015   CLINICAL DATA:  Acute left wrist pain for 1 day.  No known injury.  EXAM: LEFT WRIST - COMPLETE 3+ VIEW  COMPARISON:  None.  FINDINGS: There is no evidence of acute fracture, subluxation or dislocation.  Degenerative changes within the wrist are noted with subluxation at the lunate- triquetrum reticulation.  Only small fragments of the trapezium are present.  Dorsal soft tissue swelling is noted.  IMPRESSION: Soft tissue swelling without acute bony abnormality.  Degenerative changes within the wrist with subluxation at the lunate -trapezium articulation and only small bony fragments of the trapezium remaining.   Electronically Signed   By: Margarette Canada M.D.   On: 06/15/2015 21:45   I have personally reviewed and evaluated these images and lab results as part of my medical decision-making.   EKG Interpretation None      MDM   Final diagnoses:  Pyogenic arthritis of left wrist, due to unspecified organism [facility])    I personally performed the services described in this documentation, which was scribed in my presence. The recorded information has been reviewed and is  accurate.  Patient recently admitted for confusion and fever of unknown origin. Presents again with fever and confusion. Denies shortness of breath or cough. No nausea, vomiting, diarrhea or abdominal pain. Denies urinary symptoms. The increased swelling and redness to the left wrist  for the past day.  Left wrist aspiration performed in the emergency department. Small amount appearing material obtained. Discussed with Dr. Grandville Silos. We'll see patient in the morning. Recommends giving patient nothing by mouth with no anticoagulation. Agrees with coverage with vancomycin.  Discussed with Dr. Arnoldo Morale. Will admit to MedSurg bed.  Julianne Rice, MD 06/16/15 [id number]

## 2015-06-15 NOTE — ED Notes (Signed)
Pt was at [facility] recently for PNA. Had a fever while there. Pt still not feeling well. Pt presents with left wrist swelling. States he was so altered while there and had been restrained.

## 2015-06-16 ENCOUNTER — Inpatient Hospital Stay (HOSPITAL_COMMUNITY): Payer: Medicare Other

## 2015-06-16 DIAGNOSIS — M10032 Idiopathic gout, left wrist: Secondary | ICD-10-CM | POA: Diagnosis present

## 2015-06-16 DIAGNOSIS — F17200 Nicotine dependence, unspecified, uncomplicated: Secondary | ICD-10-CM | POA: Diagnosis not present

## 2015-06-16 DIAGNOSIS — T380X5A Adverse effect of glucocorticoids and synthetic analogues, initial encounter: Secondary | ICD-10-CM | POA: Diagnosis not present

## 2015-06-16 DIAGNOSIS — Z8249 Family history of ischemic heart disease and other diseases of the circulatory system: Secondary | ICD-10-CM | POA: Diagnosis not present

## 2015-06-16 DIAGNOSIS — F419 Anxiety disorder, unspecified: Secondary | ICD-10-CM | POA: Diagnosis not present

## 2015-06-16 DIAGNOSIS — D72828 Other elevated white blood cell count: Secondary | ICD-10-CM | POA: Diagnosis not present

## 2015-06-16 DIAGNOSIS — Z79899 Other long term (current) drug therapy: Secondary | ICD-10-CM | POA: Diagnosis not present

## 2015-06-16 DIAGNOSIS — M25532 Pain in left wrist: Secondary | ICD-10-CM

## 2015-06-16 DIAGNOSIS — N400 Enlarged prostate without lower urinary tract symptoms: Secondary | ICD-10-CM | POA: Diagnosis not present

## 2015-06-16 DIAGNOSIS — I10 Essential (primary) hypertension: Secondary | ICD-10-CM | POA: Diagnosis not present

## 2015-06-16 DIAGNOSIS — I150 Renovascular hypertension: Secondary | ICD-10-CM | POA: Diagnosis not present

## 2015-06-16 DIAGNOSIS — M009 Pyogenic arthritis, unspecified: Secondary | ICD-10-CM | POA: Diagnosis present

## 2015-06-16 DIAGNOSIS — Z96641 Presence of right artificial hip joint: Secondary | ICD-10-CM | POA: Diagnosis not present

## 2015-06-16 DIAGNOSIS — Z96612 Presence of left artificial shoulder joint: Secondary | ICD-10-CM | POA: Diagnosis not present

## 2015-06-16 DIAGNOSIS — Z8679 Personal history of other diseases of the circulatory system: Secondary | ICD-10-CM | POA: Diagnosis present

## 2015-06-16 DIAGNOSIS — D72829 Elevated white blood cell count, unspecified: Secondary | ICD-10-CM | POA: Diagnosis present

## 2015-06-16 DIAGNOSIS — G629 Polyneuropathy, unspecified: Secondary | ICD-10-CM | POA: Diagnosis not present

## 2015-06-16 DIAGNOSIS — M10019 Idiopathic gout, unspecified shoulder: Secondary | ICD-10-CM | POA: Diagnosis not present

## 2015-06-16 DIAGNOSIS — Z96652 Presence of left artificial knee joint: Secondary | ICD-10-CM | POA: Diagnosis not present

## 2015-06-16 LAB — BASIC METABOLIC PANEL
ANION GAP: 7 (ref 5–15)
BUN: 17 mg/dL (ref 6–20)
CALCIUM: 8.8 mg/dL — AB (ref 8.9–10.3)
CO2: 24 mmol/L (ref 22–32)
CREATININE: 1.04 mg/dL (ref 0.61–1.24)
Chloride: 103 mmol/L (ref 101–111)
GLUCOSE: 99 mg/dL (ref 65–99)
Potassium: 4.4 mmol/L (ref 3.5–5.1)
Sodium: 134 mmol/L — ABNORMAL LOW (ref 135–145)

## 2015-06-16 LAB — CBC
HCT: 32.9 % — ABNORMAL LOW (ref 39.0–52.0)
HEMOGLOBIN: 10.5 g/dL — AB (ref 13.0–17.0)
MCH: 28.2 pg (ref 26.0–34.0)
MCHC: 31.9 g/dL (ref 30.0–36.0)
MCV: 88.4 fL (ref 78.0–100.0)
PLATELETS: 381 10*3/uL (ref 150–400)
RBC: 3.72 MIL/uL — ABNORMAL LOW (ref 4.22–5.81)
RDW: 14.2 % (ref 11.5–15.5)
WBC: 17.9 10*3/uL — ABNORMAL HIGH (ref 4.0–10.5)

## 2015-06-16 LAB — URIC ACID: URIC ACID, SERUM: 6.4 mg/dL (ref 4.4–7.6)

## 2015-06-16 LAB — URINALYSIS, ROUTINE W REFLEX MICROSCOPIC
BILIRUBIN URINE: NEGATIVE
Glucose, UA: NEGATIVE mg/dL
HGB URINE DIPSTICK: NEGATIVE
KETONES UR: NEGATIVE mg/dL
Leukocytes, UA: NEGATIVE
NITRITE: NEGATIVE
PROTEIN: NEGATIVE mg/dL
Specific Gravity, Urine: 1.013 (ref 1.005–1.030)
UROBILINOGEN UA: 0.2 mg/dL (ref 0.0–1.0)
pH: 5.5 (ref 5.0–8.0)

## 2015-06-16 LAB — SYNOVIAL CELL COUNT + DIFF, W/ CRYSTALS
LYMPHOCYTES-SYNOVIAL FLD: 7 % (ref 0–20)
MONOCYTE-MACROPHAGE-SYNOVIAL FLUID: 4 % — AB (ref 50–90)
NEUTROPHIL, SYNOVIAL: 89 % — AB (ref 0–25)
WBC, SYNOVIAL: 15600 /mm3 — AB (ref 0–200)

## 2015-06-16 MED ORDER — VANCOMYCIN HCL 10 G IV SOLR
1250.0000 mg | INTRAVENOUS | Status: DC
  Administered 2015-06-16: 1250 mg via INTRAVENOUS
  Filled 2015-06-16 (×2): qty 1250

## 2015-06-16 MED ORDER — CITALOPRAM HYDROBROMIDE 20 MG PO TABS
20.0000 mg | ORAL_TABLET | Freq: Every day | ORAL | Status: DC
  Administered 2015-06-16 – 2015-06-18 (×3): 20 mg via ORAL
  Filled 2015-06-16 (×3): qty 1

## 2015-06-16 MED ORDER — METOPROLOL TARTRATE 50 MG PO TABS
50.0000 mg | ORAL_TABLET | Freq: Two times a day (BID) | ORAL | Status: DC
  Administered 2015-06-16 – 2015-06-18 (×5): 50 mg via ORAL
  Filled 2015-06-16 (×5): qty 1

## 2015-06-16 MED ORDER — ACETAMINOPHEN 325 MG PO TABS
650.0000 mg | ORAL_TABLET | Freq: Four times a day (QID) | ORAL | Status: DC | PRN
  Administered 2015-06-16 – 2015-06-18 (×2): 650 mg via ORAL
  Filled 2015-06-16 (×2): qty 2

## 2015-06-16 MED ORDER — VANCOMYCIN HCL IN DEXTROSE 1-5 GM/200ML-% IV SOLN
1000.0000 mg | Freq: Once | INTRAVENOUS | Status: AC
  Administered 2015-06-16: 1000 mg via INTRAVENOUS
  Filled 2015-06-16: qty 200

## 2015-06-16 MED ORDER — MORPHINE SULFATE ER 60 MG PO TBCR
60.0000 mg | EXTENDED_RELEASE_TABLET | Freq: Three times a day (TID) | ORAL | Status: DC
  Administered 2015-06-16 – 2015-06-18 (×8): 60 mg via ORAL
  Filled 2015-06-16 (×8): qty 1

## 2015-06-16 MED ORDER — ACETAMINOPHEN 650 MG RE SUPP: 650.0000 mg | Freq: Four times a day (QID) | RECTAL | Status: DC | PRN

## 2015-06-16 MED ORDER — GABAPENTIN 300 MG PO CAPS
600.0000 mg | ORAL_CAPSULE | Freq: Every day | ORAL | Status: DC
  Administered 2015-06-16 – 2015-06-17 (×2): 600 mg via ORAL
  Filled 2015-06-16: qty 2
  Filled 2015-06-16: qty 6

## 2015-06-16 MED ORDER — COLCHICINE 0.6 MG PO TABS
0.6000 mg | ORAL_TABLET | Freq: Two times a day (BID) | ORAL | Status: DC
  Administered 2015-06-16 – 2015-06-18 (×5): 0.6 mg via ORAL
  Filled 2015-06-16 (×5): qty 1

## 2015-06-16 MED ORDER — DOCUSATE SODIUM 100 MG PO CAPS: 100.0000 mg | ORAL_CAPSULE | Freq: Three times a day (TID) | ORAL | Status: DC | PRN

## 2015-06-16 MED ORDER — SODIUM CHLORIDE 0.9 % IV SOLN
INTRAVENOUS | Status: DC
  Administered 2015-06-16: 18:00:00 via INTRAVENOUS

## 2015-06-16 MED ORDER — GABAPENTIN 300 MG PO CAPS
300.0000 mg | ORAL_CAPSULE | Freq: Three times a day (TID) | ORAL | Status: DC
  Administered 2015-06-16 – 2015-06-18 (×8): 300 mg via ORAL
  Filled 2015-06-16 (×7): qty 1

## 2015-06-16 MED ORDER — METHYLPREDNISOLONE SODIUM SUCC 125 MG IJ SOLR
60.0000 mg | Freq: Two times a day (BID) | INTRAMUSCULAR | Status: DC
  Administered 2015-06-16 – 2015-06-18 (×4): 60 mg via INTRAVENOUS
  Filled 2015-06-16 (×4): qty 2

## 2015-06-16 MED ORDER — HYDROMORPHONE HCL 1 MG/ML IJ SOLN
1.0000 mg | INTRAMUSCULAR | Status: DC | PRN
  Administered 2015-06-16 (×2): 1 mg via INTRAVENOUS
  Filled 2015-06-16 (×2): qty 1

## 2015-06-16 MED ORDER — ALUM & MAG HYDROXIDE-SIMETH 200-200-20 MG/5ML PO SUSP: 30.0000 mL | Freq: Four times a day (QID) | ORAL | Status: DC | PRN

## 2015-06-16 MED ORDER — POLYETHYLENE GLYCOL 3350 17 G PO PACK
17.0000 g | PACK | Freq: Every day | ORAL | Status: DC
  Administered 2015-06-17: 17 g via ORAL
  Filled 2015-06-16 (×3): qty 1

## 2015-06-16 MED ORDER — HYDROMORPHONE HCL 1 MG/ML IJ SOLN
0.5000 mg | INTRAMUSCULAR | Status: DC | PRN
  Administered 2015-06-16 (×3): 1 mg via INTRAVENOUS
  Filled 2015-06-16 (×3): qty 1

## 2015-06-16 MED ORDER — AMLODIPINE BESYLATE 10 MG PO TABS
10.0000 mg | ORAL_TABLET | Freq: Every day | ORAL | Status: DC
  Administered 2015-06-16 – 2015-06-18 (×3): 10 mg via ORAL
  Filled 2015-06-16 (×3): qty 1

## 2015-06-16 MED ORDER — TAMSULOSIN HCL 0.4 MG PO CAPS
0.4000 mg | ORAL_CAPSULE | Freq: Every day | ORAL | Status: DC
  Administered 2015-06-16 – 2015-06-18 (×3): 0.4 mg via ORAL
  Filled 2015-06-16 (×3): qty 1

## 2015-06-16 MED ORDER — SENNOSIDES-DOCUSATE SODIUM 8.6-50 MG PO TABS
1.0000 | ORAL_TABLET | Freq: Every day | ORAL | Status: DC
  Administered 2015-06-16: 1 via ORAL
  Filled 2015-06-16 (×2): qty 1

## 2015-06-16 MED ORDER — ONDANSETRON HCL 4 MG PO TABS: 4.0000 mg | ORAL_TABLET | Freq: Four times a day (QID) | ORAL | Status: DC | PRN

## 2015-06-16 MED ORDER — INFLUENZA VAC SPLIT QUAD 0.5 ML IM SUSY
0.5000 mL | PREFILLED_SYRINGE | INTRAMUSCULAR | Status: AC
  Administered 2015-06-17: 0.5 mL via INTRAMUSCULAR
  Filled 2015-06-16: qty 0.5

## 2015-06-16 MED ORDER — SODIUM CHLORIDE 0.9 % IV BOLUS (SEPSIS)
1000.0000 mL | Freq: Once | INTRAVENOUS | Status: AC
  Administered 2015-06-16: 1000 mL via INTRAVENOUS

## 2015-06-16 MED ORDER — ONDANSETRON HCL 4 MG/2ML IJ SOLN: 4.0000 mg | Freq: Four times a day (QID) | INTRAMUSCULAR | Status: DC | PRN

## 2015-06-16 MED ORDER — SODIUM CHLORIDE 0.9 % IV SOLN
INTRAVENOUS | Status: AC
  Administered 2015-06-16: 03:00:00 via INTRAVENOUS

## 2015-06-16 NOTE — Progress Notes (Addendum)
Left wrist aspirate positive for intra- and extra-cellular CPPD crystals. Patient already started on steroids. Will keep eye on wrist aspirate cultures, but Gram stain negative for organisms.  I have conveyed to patient that he has pseudogout, and indicated he would need outpatient management with his PCP for this problem as this flare resolves and for any future flares.  Please re-consult if wrist cultures become positive or for any other surgical issues that might arise. I will now order this patient a diet.  Thanks, Micheline Rough

## 2015-06-16 NOTE — Progress Notes (Signed)
ANTIBIOTIC CONSULT NOTE - INITIAL  Pharmacy Consult for Vancomycin  Indication: Left wrist infection  No Known Allergies  Patient Measurements: Height: 5\' 10"  (177.8 cm) Weight: 181 lb 14.4 oz (82.509 kg) IBW/kg (Calculated) : 73 Vital Signs: Temp: 100.4 F (38 C) (09/30 2307) Temp Source: Rectal (09/30 2307) BP: 112/57 mmHg (10/01 0130) Pulse Rate: 79 (10/01 0130)  Labs:  Recent Labs  06/15/15 2108  WBC 21.1*  HGB 11.0*  PLT 414*  CREATININE 1.75*   Estimated Creatinine Clearance: 42.3 mL/min (by C-G formula based on Cr of 1.75).   Medical History: Past Medical History  Diagnosis Date  . Hypertension   . Anxiety     takes Celexa  . Enlarged prostate   . Neuropathy   . Arthritis   . History of blood transfusion     Assessment: Recent DC from [facility] for PNA, back to ED with possible left wrist infection, WBC is elevated at 21, noted renal dysfunction, other labs/meds reviewed. Back in acute renal failure (had previous ARF last week that resolved before DC at [facility]).   Goal of Therapy:  Vancomycin trough level 15-20 mcg/ml  Plan:  -Vancomycin 1250 mg IV q24h, increase dose is SCr trends down -Trend WBC, temp, renal function  -Drug levels as indicated   Narda Bonds 06/16/2015,2:31 AM

## 2015-06-16 NOTE — H&P (Addendum)
[facility] Admission History and Physical       William Jordan WUJ:[id number] DOB: 1946-04-07 DOA: 06/15/2015  Referring physician: EDP PCP: [name], MD  Specialists:   Chief Complaint:  Left Wrist Pain   HPI: William Jordan is a 69 y.o. male with a history of HTN, BPH, who presents to the ED with complaints of confusion, and increased pain, redness, and swelling of his left wrist x 2-3 days.  He reports having fevers and chills and was found to have a temperature to 100.4 in the ED.   An I+D of the Left Wrist area was aspirated and removed purulent liquid.   Ortho/Hand was consulted and is to see him in the ED.  He was placed on IV Vancomycin and Zosyn.     Review of Systems:  Constitutional: No Weight Loss, No Weight Gain, Night Sweats, Fevers, Chills, Dizziness, Light Headedness, Fatigue, or Generalized Weakness HEENT: No Headaches, Difficulty Swallowing,Tooth/Dental Problems,Sore Throat,  No Sneezing, Rhinitis, Ear Ache, Nasal Congestion, or Post Nasal Drip,  Cardio-vascular:  No Chest pain, Orthopnea, PND, Edema in Lower Extremities, Anasarca, Dizziness, Palpitations  Resp: No Dyspnea, No DOE, No Productive Cough, No Non-Productive Cough, No Hemoptysis, No Wheezing.    GI: No Heartburn, Indigestion, Abdominal Pain, Nausea, Vomiting, Diarrhea, Constipation, Hematemesis, Hematochezia, Melena, Change in Bowel Habits,  Loss of Appetite  GU: No Dysuria, No Change in Color of Urine, No Urgency or Urinary Frequency, No Flank pain.  Musculoskeletal: +Left Wrist Pain and Swelling,  No Decreased Range of Motion, No Back Pain.  Neurologic: No Syncope, No Seizures, Muscle Weakness, Paresthesia, Vision Disturbance or Loss, No Diplopia, No Vertigo, No Difficulty Walking,  Skin: No Rash or Lesions. Psych: No Change in Mood or Affect, No Depression or Anxiety, No Memory loss, +Confusion, or Hallucinations   Past Medical History  Diagnosis Date  . Hypertension   . Anxiety     takes Celexa    . Enlarged prostate   . Neuropathy   . Arthritis   . History of blood transfusion      Past Surgical History  Procedure Laterality Date  . Joint replacement Left     knee, x 2  . Joint replacement Right     hip  . Joint replacement Left     partial shoulder  . Elbow surgery Right   . Hernia repair      umbilical  . Tonsillectomy    . Vasectomy    . Colonoscopy      one polyp removed - benign  . Spinal cord stimulator insertion N/A 09/28/2014    Procedure: LUMBAR SPINAL CORD STIMULATOR INSERTION;  Surgeon: Melina Schools, MD;  Location: [place];  Service: Orthopedics;  Laterality: N/A;      Prior to Admission medications   Medication Sig Start Date End Date Taking? Authorizing Provider  amLODipine (NORVASC) 10 MG tablet Take 1 tablet (10 mg total) by mouth daily. 06/12/15  Yes Eugenie Filler, MD  citalopram (CELEXA) 10 MG tablet Take 20 mg by mouth daily.   Yes Historical Provider, MD  colchicine (COLCRYS) 0.6 MG tablet Take 1 tablet (0.6 mg total) by mouth 2 (two) times daily. 06/12/15  Yes Eugenie Filler, MD  docusate sodium (COLACE) 100 MG capsule Take 1 capsule (100 mg total) by mouth 3 (three) times daily as needed for mild constipation. 09/28/14  Yes Melina Schools, MD  gabapentin (NEURONTIN) 300 MG capsule Take 1 capsule (300 mg total) by mouth 3 (three) times daily  before meals. 06/12/15  Yes Eugenie Filler, MD  gabapentin (NEURONTIN) 300 MG capsule Take 2 capsules (600 mg total) by mouth at bedtime. 06/12/15  Yes Eugenie Filler, MD  metoprolol (LOPRESSOR) 50 MG tablet Take 1 tablet (50 mg total) by mouth 2 (two) times daily. 06/12/15  Yes Eugenie Filler, MD  morphine (MS CONTIN) 60 MG 12 hr tablet Take 60 mg by mouth every 8 (eight) hours.   Yes Historical Provider, MD  polyethylene glycol (MIRALAX / GLYCOLAX) packet Take 17 g by mouth daily. 06/12/15  Yes Eugenie Filler, MD  psyllium (METAMUCIL) 58.6 % packet Take 1 packet by mouth 2 (two) times daily.   Yes  Historical Provider, MD  senna-docusate (SENOKOT-S) 8.6-50 MG tablet Take 1 tablet by mouth at bedtime. 06/12/15  Yes Eugenie Filler, MD  tamsulosin (FLOMAX) 0.4 MG CAPS capsule Take 0.4 mg by mouth daily.   Yes Historical Provider, MD     No Known Allergies  Social History:  reports that he has been smoking E-cigarettes.  He has quit using smokeless tobacco. His smokeless tobacco use included Snuff. He reports that he does not drink alcohol or use illicit drugs.    Family History  Problem Relation Age of Onset  . Heart disease Father        Physical Exam:  GEN:  Pleasant Obese 69 y.o. Caucasian male examined and in no acute distress; cooperative with exam Filed Vitals:   06/15/15 2330 06/16/15 0030 06/16/15 0100 06/16/15 0130  BP: 113/71 116/59 119/72 112/57  Pulse: 83 80 80 79  Temp:      TempSrc:      Resp: 9 10 11 12   Height:      Weight:      SpO2: 95% 96% 97% 95%   Blood pressure 112/57, pulse 79, temperature 100.4 F (38 C), temperature source Rectal, resp. rate 12, height 5\' 10"  (1.778 m), weight 82.509 kg (181 lb 14.4 oz), SpO2 95 %. PSYCH: He is alert and oriented x4; does not appear anxious does not appear depressed; affect is normal HEENT: Normocephalic and Atraumatic, Mucous membranes pink; PERRLA; EOM intact; Fundi:  Benign;  No scleral icterus, Nares: Patent, Oropharynx: Clear, Fair Dentition,    Neck:  FROM, No Cervical Lymphadenopathy nor Thyromegaly or Carotid Bruit; No JVD; Breasts:: Not examined CHEST WALL: No tenderness CHEST: Normal respiration, clear to auscultation bilaterally HEART: Regular rate and rhythm; no murmurs rubs or gallops BACK: No kyphosis or scoliosis; No CVA tenderness ABDOMEN: Positive Bowel Sounds, Obese, Soft Non-Tender, No Rebound or Guarding; No Masses, No Organomegaly. Rectal Exam: Not done EXTREMITIES: No Cyanosis, Clubbing, or Edema; No Ulcerations. Genitalia: not examined PULSES: 2+ and symmetric SKIN: Normal hydration no  rash or ulceration CNS:  Alert and Oriented x 4, No Focal Deficits Vascular: pulses palpable throughout    Labs on Admission:  Basic Metabolic Panel:  Recent Labs Lab 06/09/15 0345 06/10/15 0334 06/11/15 0547 06/12/15 0454 06/15/15 2108  NA 136 134* 135 134* 132*  K 3.0* 4.0 4.1 3.6 4.6  CL 99* 98* 99* 100* 98*  CO2 27 26 25 26 24   GLUCOSE 108* 103* 132* 99 116*  BUN <5* 6 18 20 19   CREATININE 0.59* 0.64 0.71 0.63 1.75*  CALCIUM 8.6* 8.7* 9.1 8.6* 8.6*  MG 1.6* 1.8  --   --   --    Liver Function Tests:  Recent Labs Lab 06/15/15 2108  AST 23  ALT 27  ALKPHOS 83  BILITOT 0.3  PROT 6.5  ALBUMIN 3.4*   No results for input(s): LIPASE, AMYLASE in the last 168 hours. No results for input(s): AMMONIA in the last 168 hours. CBC:  Recent Labs Lab 06/09/15 0345 06/10/15 0334 06/11/15 0547 06/12/15 0454 06/15/15 2108  WBC 11.8* 11.9* 16.5* 14.9* 21.1*  NEUTROABS  --   --   --   --  16.4*  HGB 10.8* 10.7* 11.1* 10.6* 11.0*  HCT 31.9* 32.5* 33.1* 31.8* 34.7*  MCV 86.7 87.4 85.8 87.6 89.0  PLT 343 368 412* 436* 414*   Cardiac Enzymes: No results for input(s): CKTOTAL, CKMB, CKMBINDEX, TROPONINI in the last 168 hours.  BNP (last 3 results) No results for input(s): BNP in the last 8760 hours.  ProBNP (last 3 results) No results for input(s): PROBNP in the last 8760 hours.  CBG:  Recent Labs Lab 06/11/15 2258  GLUCAP 113*    Radiological Exams on Admission: Dg Chest 2 View  06/15/2015   CLINICAL DATA:  Shortness of breath and fever. Diagnosed with pneumonia 1 week ago  EXAM: CHEST  2 VIEW  COMPARISON:  06/05/2015  FINDINGS: Normal heart size and stable aortic tortuosity.  Previously seen pulmonary edema is resolved. Airspace opacity on chest CT 7 days ago is not visualized. There is no effusion or pneumothorax.  Remote mid right clavicle and L2 body fractures. Left glenohumeral arthroplasty. Dorsal column stimulator.  IMPRESSION: No evidence of active  cardiopulmonary disease.   Electronically Signed   By: Monte Fantasia M.D.   On: 06/15/2015 21:43   Dg Wrist Complete Left  06/15/2015   CLINICAL DATA:  Acute left wrist pain for 1 day.  No known injury.  EXAM: LEFT WRIST - COMPLETE 3+ VIEW  COMPARISON:  None.  FINDINGS: There is no evidence of acute fracture, subluxation or dislocation.  Degenerative changes within the wrist are noted with subluxation at the lunate- triquetrum reticulation.  Only small fragments of the trapezium are present.  Dorsal soft tissue swelling is noted.  IMPRESSION: Soft tissue swelling without acute bony abnormality.  Degenerative changes within the wrist with subluxation at the lunate -trapezium articulation and only small bony fragments of the trapezium remaining.   Electronically Signed   By: Margarette Canada M.D.   On: 06/15/2015 21:45     EKG: Independently reviewed.     Assessment/Plan:      69 y.o. male with  Principal Problem:   1.     Septic joint of left wrist (HCC)   IV Vancomycin    IVFs   Ortho: Hand to see in AM   Active Problems:   2.     Acute encephalopathy- due to #1     3.     Hypertension   Monitor BPs   contiue Amlodipine and Lopressor as BP tolerates     4.     Leukocytosis- due to #1     5.     DVT Prophylaxis   SCDs   Code Status:     FULL CODE      Family Communication:  No Family Present    Disposition Plan:    Inpatient  Observation Status        Time spent:  70 Minutes      Theressa Millard [facility] Pager [phone]   If [place] Please Contact the Day Rounding Team MD for [facility]  If 7PM-7AM, Please Contact Night-Floor Coverage  [email or web address] Password [email or web address] 06/16/2015, 2:03 AM  ADDENDUM:   Patient was seen and examined on 06/16/2015

## 2015-06-16 NOTE — Procedures (Signed)
L wrist aspiration under fluoro: 1.5 ml cloudy yellow fluid, submitted for requested studies No complication No blood loss. See complete dictation in [facility].

## 2015-06-16 NOTE — Consult Note (Signed)
ORTHOPAEDIC CONSULTATION HISTORY & PHYSICAL REQUESTING PHYSICIAN: Nita Sells, MD  Chief Complaint: left wrist pain  HPI: William Jordan is a 69 y.o. male who was admitted overnight for acute left wrist pain. He reports that it has been developing over the past week or so. He reports that he presented yesterday to the hospital only because of pain in the left wrist and for no other reasons. He reports that from time to time in the past, he would have some left wrist pain, and even once had a steroidal injection into it. He reports that he has never had a bout as painful as this however. He was recently hospitalized and had an extensive workup for possible infectious sources. At that time, he was noted to have ankle pain and swelling. The following was lifted from his discharge summary on 06-12-15:   #10 right ankle pain likely gout Plain films of the right foot negative. Uric acid at 4.4. Patient however noted to have uric acid crystals in UA on 06/06/2015. No further foot pain after dose of steriods. Place on colchicine BID.Outpatient follow up.  Nonetheless, when he presented last night, the emergency room physician aspirated the swollen wrist blindly, obtaining by his report 1 mL of purulent appearing fluid which was sent for Gram stain and culture. Gram stain reveals abundant white blood cells with no organisms. There was apparently not enough fluid for cell count/crystal analysis.  Past Medical History  Diagnosis Date  . Hypertension   . Anxiety     takes Celexa  . Enlarged prostate   . Neuropathy   . Arthritis   . History of blood transfusion    Past Surgical History  Procedure Laterality Date  . Joint replacement Left     knee, x 2  . Joint replacement Right     hip  . Joint replacement Left     partial shoulder  . Elbow surgery Right   . Hernia repair      umbilical  . Tonsillectomy    . Vasectomy    . Colonoscopy      one polyp removed - benign  . Spinal cord  stimulator insertion N/A 09/28/2014    Procedure: LUMBAR SPINAL CORD STIMULATOR INSERTION;  Surgeon: Melina Schools, MD;  Location: [place];  Service: Orthopedics;  Laterality: N/A;   Social History   Social History  . Marital Status: Married    Spouse Name: N/A  . Number of Children: N/A  . Years of Education: N/A   Social History Main Topics  . Smoking status: Current Every Day Smoker    Types: E-cigarettes  . Smokeless tobacco: Former Systems developer    Types: Snuff     Comment: does not use any tobacco products any more  . Alcohol Use: No  . Drug Use: No  . Sexual Activity: No   Other Topics Concern  . None   Social History Narrative   Family History  Problem Relation Age of Onset  . Heart disease Father    No Known Allergies Prior to Admission medications   Medication Sig Start Date End Date Taking? Authorizing Provider  amLODipine (NORVASC) 10 MG tablet Take 1 tablet (10 mg total) by mouth daily. 06/12/15  Yes Eugenie Filler, MD  citalopram (CELEXA) 10 MG tablet Take 20 mg by mouth daily.   Yes Historical Provider, MD  colchicine (COLCRYS) 0.6 MG tablet Take 1 tablet (0.6 mg total) by mouth 2 (two) times daily. 06/12/15  Yes Eugenie Filler,  MD  docusate sodium (COLACE) 100 MG capsule Take 1 capsule (100 mg total) by mouth 3 (three) times daily as needed for mild constipation. 09/28/14  Yes Melina Schools, MD  gabapentin (NEURONTIN) 300 MG capsule Take 1 capsule (300 mg total) by mouth 3 (three) times daily before meals. 06/12/15  Yes Eugenie Filler, MD  gabapentin (NEURONTIN) 300 MG capsule Take 2 capsules (600 mg total) by mouth at bedtime. 06/12/15  Yes Eugenie Filler, MD  metoprolol (LOPRESSOR) 50 MG tablet Take 1 tablet (50 mg total) by mouth 2 (two) times daily. 06/12/15  Yes Eugenie Filler, MD  morphine (MS CONTIN) 60 MG 12 hr tablet Take 60 mg by mouth every 8 (eight) hours.   Yes Historical Provider, MD  polyethylene glycol (MIRALAX / GLYCOLAX) packet Take 17 g by  mouth daily. 06/12/15  Yes Eugenie Filler, MD  psyllium (METAMUCIL) 58.6 % packet Take 1 packet by mouth 2 (two) times daily.   Yes Historical Provider, MD  senna-docusate (SENOKOT-S) 8.6-50 MG tablet Take 1 tablet by mouth at bedtime. 06/12/15  Yes Eugenie Filler, MD  tamsulosin (FLOMAX) 0.4 MG CAPS capsule Take 0.4 mg by mouth daily.   Yes Historical Provider, MD   Dg Chest 2 View  06/15/2015   CLINICAL DATA:  Shortness of breath and fever. Diagnosed with pneumonia 1 week ago  EXAM: CHEST  2 VIEW  COMPARISON:  06/05/2015  FINDINGS: Normal heart size and stable aortic tortuosity.  Previously seen pulmonary edema is resolved. Airspace opacity on chest CT 7 days ago is not visualized. There is no effusion or pneumothorax.  Remote mid right clavicle and L2 body fractures. Left glenohumeral arthroplasty. Dorsal column stimulator.  IMPRESSION: No evidence of active cardiopulmonary disease.   Electronically Signed   By: Monte Fantasia M.D.   On: 06/15/2015 21:43   Dg Wrist Complete Left  06/15/2015   CLINICAL DATA:  Acute left wrist pain for 1 day.  No known injury.  EXAM: LEFT WRIST - COMPLETE 3+ VIEW  COMPARISON:  None.  FINDINGS: There is no evidence of acute fracture, subluxation or dislocation.  Degenerative changes within the wrist are noted with subluxation at the lunate- triquetrum reticulation.  Only small fragments of the trapezium are present.  Dorsal soft tissue swelling is noted.  IMPRESSION: Soft tissue swelling without acute bony abnormality.  Degenerative changes within the wrist with subluxation at the lunate -trapezium articulation and only small bony fragments of the trapezium remaining.   Electronically Signed   By: Margarette Canada M.D.   On: 06/15/2015 21:45    Positive ROS: All other systems have been reviewed and were otherwise negative with the exception of those mentioned in the HPI and as above.  Physical Exam: Vitals: Refer to EMR. Constitutional:  WD, WN, NAD HEENT:  NCAT,  EOMI Neuro/Psych:  Alert & oriented to person, place, and time; appropriate mood & affect Lymphatic: No generalized extremity edema or lymphadenopathy Extremities / MSK:  The extremities are normal with respect to appearance, ranges of motion, joint stability, muscle strength/tone, sensation, & perfusion except as otherwise noted:  The left wrist region is globally swollen and a little warm. He has pain with passive movement of the wrist in the plane of flexion extension, but also with radial and ulnar deviation. However at the wrist is stabilized, he also has pain with passive stretch applied to the extensor tendons such that he tends to keep them extended.  The remainder of the hand,  fingers, and forearm is fairly unremarkable.  Xrays:  Wrist x-rays performed yesterday reveal previous thumb suspension arthroplasty with trapezium ectomy, but also evidence for calcinosis of soft tissues such as the TFCC.  Assessment: Acute left wrist extensor tenosynovitis and synovitis of unclear etiology. Given the fact that the extensor tendons are painful with stretch, even when the wrist joint is held completely still, I suspect the inflammation involves more than just the wrist joint, which would be less likely with a hematogenous septic arthritis of the wrist. With all of the evidence presently available, I suspect this represents an inflammatory/crystalline arthropathy Such as gout or pseudogout. However, infection cannot be categorically discounted.  Recommendations: I discussed these findings with him. I think that it is time for a tissue fluid based diagnosis to put the issue to rest. The previous aspiration was sent for culture, which is still pending, but demonstrated no organisms on Gram stain. Unfortunately, there was not enough fluid for crystal analysis. I have requested that interventional radiology perform a fluoroscopically guided left wrist aspiration to obtain fluid for cell count/crystal  analysis. In the interim, treatment with antibiotics is reasonable. I recommend keeping him NPO and without pharmacologic anticoagulation until after the cellcount/crystal analysis results are returned.  Rayvon Char [address], [place], [place]  [address] Office: [phone] Mobile: [phone]

## 2015-06-16 NOTE — Progress Notes (Signed)
William Jordan:[id number] DOB: Apr 02, 1946 DOA: 06/15/2015 PCP: [name], MD  Brief narrative: 69 y/o ? Recent admission 9/56--2/13 Metabolic encephalopathy/AKI with hyperkalemia +sepsis thought to be secondary to healthcare associated pneumonia-he was palced on precedex that admission as had metabolic encephalopathy Chr Pain Prostatic hypertrophy DJD L TKR x2 Possible gout in ankle  Re-admitted to hospital 9/30 with pain and swelling int eh L hand and Tmax >101 He is not sure if he has been taking medication for gout or not    Past medical history-As per Problem list Chart reviewed as below-   Consultants:  Ortho-[facility]  Procedures:    Antibiotics: Vancomycin 9/30    Subjective   very tender LUE and hand, Even slight touching it causes pain No cp nop sob No chills   Objective    Interim History:   Telemetry:    Objective: Filed Vitals:   06/16/15 0230 06/16/15 0245 06/16/15 0322 06/16/15 0600  BP: 130/67 122/68 128/64 127/76  Pulse: 77 80 76 65  Temp:   98.2 F (36.8 C) 97.6 F (36.4 C)  TempSrc:      Resp: 11 11 16 18   Height:      Weight:      SpO2: 96% 98% 98% 97%    Intake/Output Summary (Last 24 hours) at 06/16/15 1141 Last data filed at 06/16/15 0900  Gross per 24 hour  Intake    225 ml  Output   1200 ml  Net   -975 ml    Exam:  General: eomi ncat Cardiovascular: s1 s 2no m/r/g Respiratory: clear no added sound Abdomen: soft nt nd no rebound nor gaurd Skin very swollen L wrist and hand.  Passive movement of the wrist joint causes pain touching the skin causes pain Neuro intact sensory is hyperesthetic however over left wrist   Data Reviewed: Basic Metabolic Panel:  Recent Labs Lab 06/10/15 0334 06/11/15 0547 06/12/15 0454 06/15/15 2108 06/16/15 0740  NA 134* 135 134* 132* 134*  K 4.0 4.1 3.6 4.6 4.4  CL 98* 99* 100* 98* 103  CO2 26 25 26 24 24   GLUCOSE 103* 132* 99 116* 99  BUN 6 18 20 19 17   CREATININE 0.64 0.71  0.63 1.75* 1.04  CALCIUM 8.7* 9.1 8.6* 8.6* 8.8*  MG 1.8  --   --   --   --    Liver Function Tests:  Recent Labs Lab 06/15/15 2108  AST 23  ALT 27  ALKPHOS 83  BILITOT 0.3  PROT 6.5  ALBUMIN 3.4*   No results for input(s): LIPASE, AMYLASE in the last 168 hours. No results for input(s): AMMONIA in the last 168 hours. CBC:  Recent Labs Lab 06/10/15 0334 06/11/15 0547 06/12/15 0454 06/15/15 2108 06/16/15 0740  WBC 11.9* 16.5* 14.9* 21.1* 17.9*  NEUTROABS  --   --   --  16.4*  --   HGB 10.7* 11.1* 10.6* 11.0* 10.5*  HCT 32.5* 33.1* 31.8* 34.7* 32.9*  MCV 87.4 85.8 87.6 89.0 88.4  PLT 368 412* 436* 414* 381   Cardiac Enzymes: No results for input(s): CKTOTAL, CKMB, CKMBINDEX, TROPONINI in the last 168 hours. BNP: Invalid input(s): POCBNP CBG:  Recent Labs Lab 06/11/15 2258  GLUCAP 113*    Recent Results (from the past 240 hour(s))  Body fluid culture     Status: None (Preliminary result)   Collection Time: 06/16/15 12:59 AM  Result Value Ref Range Status   Specimen Description SYNOVIAL LEFT WRIST  Final  Special Requests NONE  Final   Gram Stain   Final    ABUNDANT WBC PRESENT,BOTH PMN AND MONONUCLEAR NO ORGANISMS SEEN    Culture PENDING  Incomplete   Report Status PENDING  Incomplete     Studies:              All Imaging reviewed and is as per above notation   Scheduled Meds: . sodium chloride   Intravenous STAT  . amLODipine  10 mg Oral Daily  . citalopram  20 mg Oral Daily  . colchicine  0.6 mg Oral BID  . gabapentin  300 mg Oral TID AC  . gabapentin  600 mg Oral QHS  . [START ON 06/17/2015] Influenza vac split quadrivalent PF  0.5 mL Intramuscular Tomorrow-1000  . methylPREDNISolone (SOLU-MEDROL) injection  60 mg Intravenous Q12H  . metoprolol  50 mg Oral BID  . morphine  60 mg Oral Q8H  . polyethylene glycol  17 g Oral Daily  . senna-docusate  1 tablet Oral QHS  . tamsulosin  0.4 mg Oral Daily  . vancomycin  1,250 mg Intravenous Q24H    Continuous Infusions: . sodium chloride       Assessment/Plan: Agree with plan of care as dictated per Dr. Houston Siren that the patient has an infectious etiology  I've started Solu-Medrol 60 every 12 IV as I'm on clear as to if this is some infection I do not think that this will turn out to be the case   interventional radiology is supposed to sample the area on the wrist for [place] to determine if this is gout We will continue vancomycin empirically but it is once again doubtful that this infection  I've discussed with patient alone and will discuss with his wife when I know more   Verneita Griffes, MD  [facility] Pager [phone] 06/16/2015, 11:41 AM    LOS: 0 days

## 2015-06-17 DIAGNOSIS — M10032 Idiopathic gout, left wrist: Secondary | ICD-10-CM | POA: Diagnosis not present

## 2015-06-17 LAB — BASIC METABOLIC PANEL
ANION GAP: 8 (ref 5–15)
BUN: 13 mg/dL (ref 6–20)
CALCIUM: 8.6 mg/dL — AB (ref 8.9–10.3)
CO2: 25 mmol/L (ref 22–32)
Chloride: 100 mmol/L — ABNORMAL LOW (ref 101–111)
Creatinine, Ser: 0.76 mg/dL (ref 0.61–1.24)
GFR calc non Af Amer: >60 mL/min (ref >60)
GLUCOSE: 165 mg/dL — AB (ref 65–99)
Potassium: 3.8 mmol/L (ref 3.5–5.1)
Sodium: 133 mmol/L — ABNORMAL LOW (ref 135–145)

## 2015-06-17 LAB — CBC
HEMATOCRIT: 32.5 % — AB (ref 39.0–52.0)
HEMOGLOBIN: 10.6 g/dL — AB (ref 13.0–17.0)
MCH: 28.7 pg (ref 26.0–34.0)
MCHC: 32.6 g/dL (ref 30.0–36.0)
MCV: 88.1 fL (ref 78.0–100.0)
Platelets: 351 10*3/uL (ref 150–400)
RBC: 3.69 MIL/uL — AB (ref 4.22–5.81)
RDW: 13.9 % (ref 11.5–15.5)
WBC: 18.2 10*3/uL — ABNORMAL HIGH (ref 4.0–10.5)

## 2015-06-17 LAB — URINE CULTURE: Culture: NO GROWTH

## 2015-06-17 NOTE — Progress Notes (Signed)
William Jordan ZOX:[id number] DOB: [birth year] DOA: 06/15/2015 PCP: [name], MD  Brief narrative: 69 y/o ? Recent admission 8/11--9/14 Metabolic encephalopathy/AKI with hyperkalemia +sepsis thought to be secondary to healthcare associated pneumonia-he was palced on precedex that admission as had metabolic encephalopathy Chr Pain Prostatic hypertrophy DJD L TKR x2 Possible gout in ankle  Re-admitted to hospital 9/30 with pain and swelling int eh L hand and Tmax >101 He is not sure if he has been taking medication for gout or not    Past medical history-As per Problem list Chart reviewed as below-   Consultants:  Ortho-[facility]  Procedures:    Antibiotics: Vancomycin 9/30-10/2   Subjective   Pain improved Still tender somewhat More mobility No fever nor chills Overall improved  tol diet   Objective    Interim History:   Telemetry:    Objective: Filed Vitals:   06/17/15 0001 06/17/15 0200 06/17/15 0300 06/17/15 0532  BP:    167/90  Pulse:    73  Temp:    97.8 F (36.6 C)  TempSrc:    Oral  Resp: 16 16 18 18   Height:      Weight:      SpO2:    99%    Intake/Output Summary (Last 24 hours) at 06/17/15 1032 Last data filed at 06/17/15 0900  Gross per 24 hour  Intake 1972.5 ml  Output    400 ml  Net 1572.5 ml    Exam:  General: eomi ncat Cardiovascular: s1 s 2no m/r/g Respiratory: clear no added sound Abdomen: soft nt nd no rebound nor gaurd Skin very swollen L wrist and hand.  Passive movement to wrist is less painful Neuro intact   Data Reviewed: Basic Metabolic Panel:  Recent Labs Lab 06/11/15 0547 06/12/15 0454 06/15/15 2108 06/16/15 0740 06/17/15 0629  NA 135 134* 132* 134* 133*  K 4.1 3.6 4.6 4.4 3.8  CL 99* 100* 98* 103 100*  CO2 25 26 24 24 25   GLUCOSE 132* 99 116* 99 165*  BUN 18 20 19 17 13   CREATININE 0.71 0.63 1.75* 1.04 0.76  CALCIUM 9.1 8.6* 8.6* 8.8* 8.6*   Liver Function Tests:  Recent Labs Lab  06/15/15 2108  AST 23  ALT 27  ALKPHOS 83  BILITOT 0.3  PROT 6.5  ALBUMIN 3.4*   No results for input(s): LIPASE, AMYLASE in the last 168 hours. No results for input(s): AMMONIA in the last 168 hours. CBC:  Recent Labs Lab 06/11/15 0547 06/12/15 0454 06/15/15 2108 06/16/15 0740 06/17/15 0629  WBC 16.5* 14.9* 21.1* 17.9* 18.2*  NEUTROABS  --   --  16.4*  --   --   HGB 11.1* 10.6* 11.0* 10.5* 10.6*  HCT 33.1* 31.8* 34.7* 32.9* 32.5*  MCV 85.8 87.6 89.0 88.4 88.1  PLT 412* 436* 414* 381 351   Cardiac Enzymes: No results for input(s): CKTOTAL, CKMB, CKMBINDEX, TROPONINI in the last 168 hours. BNP: Invalid input(s): POCBNP CBG:  Recent Labs Lab 06/11/15 2258  GLUCAP 113*    Recent Results (from the past 240 hour(s))  Culture, blood (routine x 2)     Status: None (Preliminary result)   Collection Time: 06/15/15  8:05 PM  Result Value Ref Range Status   Specimen Description BLOOD RIGHT HAND  Final   Special Requests BOTTLES DRAWN AEROBIC AND ANAEROBIC 5CC  Final   Culture NO GROWTH < 24 HOURS  Final   Report Status PENDING  Incomplete  Body fluid culture  Status: None (Preliminary result)   Collection Time: 06/16/15 12:59 AM  Result Value Ref Range Status   Specimen Description SYNOVIAL LEFT WRIST  Final   Special Requests NONE  Final   Gram Stain   Final    ABUNDANT WBC PRESENT,BOTH PMN AND MONONUCLEAR NO ORGANISMS SEEN    Culture PENDING  Incomplete   Report Status PENDING  Incomplete  Urine culture     Status: None   Collection Time: 06/16/15  2:22 AM  Result Value Ref Range Status   Specimen Description URINE, CLEAN CATCH  Final   Special Requests NONE  Final   Culture NO GROWTH 1 DAY  Final   Report Status 06/17/2015 FINAL  Final  Body fluid culture     Status: None (Preliminary result)   Collection Time: 06/16/15  1:33 PM  Result Value Ref Range Status   Specimen Description SYNOVIAL LEFT WRIST  Final   Special Requests NONE  Final   Gram Stain  ABUNDANT POLYMORPHONUCLEAR NO ORGANISMS SEEN   Final   Culture PENDING  Incomplete   Report Status PENDING  Incomplete     Studies:              All Imaging reviewed and is as per above notation   Scheduled Meds: . amLODipine  10 mg Oral Daily  . citalopram  20 mg Oral Daily  . colchicine  0.6 mg Oral BID  . gabapentin  300 mg Oral TID AC  . gabapentin  600 mg Oral QHS  . methylPREDNISolone (SOLU-MEDROL) injection  60 mg Intravenous Q12H  . metoprolol  50 mg Oral BID  . morphine  60 mg Oral Q8H  . polyethylene glycol  17 g Oral Daily  . senna-docusate  1 tablet Oral QHS  . tamsulosin  0.4 mg Oral Daily   Continuous Infusions:     Assessment/Plan:  Gouty arthritis -continue solumedrol till 10/3 -transition to PO steroids then -Expect will resolve slowly -Continue colchicine 0.2 bid  Leukocytosis -combination of gout and steroids -monitor  Prostate hypertrophy -cont flomax 0.4 daily  Chr Pain -states is from neuropathy -he will need to follow with pcp for chr pain meds of morphione 60q8 -contionue Gabapentin 300 tid and 600 qhs  htn continue amlodipine 10 daily  Metoprolol 50 bid Only mod controlled 2/2 to severe pain  Likely can d/c 1-2 days if all resolved  Verneita Griffes, MD  [facility] Pager [phone] 06/17/2015, 10:32 AM    LOS: 1 day

## 2015-06-17 NOTE — Progress Notes (Signed)
Utilization Review Completed.Loden Laurent T10/10/2014  

## 2015-06-18 DIAGNOSIS — I150 Renovascular hypertension: Secondary | ICD-10-CM

## 2015-06-18 DIAGNOSIS — M109 Gout, unspecified: Secondary | ICD-10-CM

## 2015-06-18 DIAGNOSIS — D72829 Elevated white blood cell count, unspecified: Secondary | ICD-10-CM

## 2015-06-18 DIAGNOSIS — I10 Essential (primary) hypertension: Secondary | ICD-10-CM

## 2015-06-18 DIAGNOSIS — M10019 Idiopathic gout, unspecified shoulder: Secondary | ICD-10-CM

## 2015-06-18 MED ORDER — PREDNISONE 20 MG PO TABS: 40.0000 mg | ORAL_TABLET | Freq: Every day | ORAL | Status: DC

## 2015-06-18 MED ORDER — PREDNISONE 20 MG PO TABS: 20.0000 mg | ORAL_TABLET | Freq: Every day | ORAL | Status: DC

## 2015-06-18 MED ORDER — PREDNISONE 20 MG PO TABS
20.0000 mg | ORAL_TABLET | Freq: Once | ORAL | Status: AC
  Administered 2015-06-18: 20 mg via ORAL
  Filled 2015-06-18: qty 1

## 2015-06-18 MED ORDER — PREDNISONE 20 MG PO TABS
20.0000 mg | ORAL_TABLET | Freq: Every day | ORAL | Status: DC
  Administered 2015-06-18: 20 mg via ORAL
  Filled 2015-06-18: qty 1

## 2015-06-18 NOTE — Progress Notes (Signed)
Physician Discharge Summary  William Jordan WIO:[id number] DOB: Jul 16, 1946 DOA: 06/15/2015  PCP: [name], MD  Admit date: 06/15/2015 Discharge date: 06/18/2015  Time spent: 25 minutes  Recommendations for Outpatient Follow-up:  1. Steroids to be given in tapering fashion as dictated below 2. Needs complete metabolic panel and CBC maybe in about 2 weeks 3. Interval follow-up with primary care physician  4. No therapy needs on discharge 5. Suggest follow up of culture results from time of hand aspiration  Discharge Diagnoses:  Principal Problem:   Gout attack Active Problems:   Acute encephalopathy   Septic joint of left wrist ([place])   Hypertension   Leukocytosis   Septic joint ([place])   Discharge Condition: Fair  Diet recommendation: Heart healthy  Filed Weights   06/15/15 2044  Weight: 82.509 kg (181 lb 14.4 oz)    History of present illness:  69 y/o ? Recent admission 4/26--8/34 Metabolic encephalopathy/AKI with hyperkalemia +sepsis thought to be secondary to healthcare associated pneumonia-he was palced on precedex that admission as had metabolic encephalopathy Chr Pain Prostatic hypertrophy DJD L TKR x2 Possible gout in ankle  Re-admitted to hospital 9/30 with pain and swelling int eh L hand and Tmax >101 He is not sure if he has been taking medication for gout or not He was admitted and his wrist was aspirated on the left side where he had pain and significant swelling hand surgery saw the patient in consult and did not feel based on those results this was an infected joint although there was consideration of the same   Hospital Course:  Gouty arthritis -continue solumedrol till 10/3 -transition to PO steroids then -Expect will resolve slowly -Continue colchicine 0.2 bid -On discharge have placed him on prednisone 40 mg for 5 days and then a taper to 20 mg for 5 days and then stop -He should follow-up with his primary care physician as an  outpatient  Leukocytosis -combination of gout and steroids -monitor  Prostate hypertrophy -cont flomax 0.4 daily  Chr Pain -states is from neuropathy -he will need to follow with pcp for chr pain meds of morphione 60q8 -contionue Gabapentin 300 tid and 600 qhs  htn continue amlodipine 10 daily  Metoprolol 50 bid Only mod controlled 2/2 to severe pain  Recent community-acquired pneumonia-completed all antibiotics does not need any further     Discharge Exam: Filed Vitals:   06/18/15 0519  BP: 168/86  Pulse: 63  Temp: 97.9 F (36.6 C)  Resp: 18    General: EOMI NCAT Cardiovascular: S1-S2 no murmur rub or gallop Respiratory: Clinically clear  Discharge Instructions   Discharge Instructions    Diet - low sodium heart healthy    Complete by:  As directed      Discharge instructions    Complete by:  As directed   Take 40 mg dose of prednisone for 5 days until 10/8  then take 30 mg dose for 5 days until 10/12 Then stopped the steroids Continue colchicine please Follow-up with her primary physician     Increase activity slowly    Complete by:  As directed           Current Discharge Medication List    START taking these medications   Details  !! predniSONE (DELTASONE) 20 MG tablet Take 1 tablet (20 mg total) by mouth daily before breakfast. Qty: 5 tablet, Refills: 0    !! predniSONE (DELTASONE) 20 MG tablet Take 2 tablets (40 mg total) by mouth daily before breakfast.  Qty: 10 tablet, Refills: 0     !! - Potential duplicate medications found. Please discuss with provider.    CONTINUE these medications which have NOT CHANGED   Details  amLODipine (NORVASC) 10 MG tablet Take 1 tablet (10 mg total) by mouth daily. Qty: 30 tablet, Refills: 0    citalopram (CELEXA) 10 MG tablet Take 20 mg by mouth daily.    colchicine (COLCRYS) 0.6 MG tablet Take 1 tablet (0.6 mg total) by mouth 2 (two) times daily. Qty: 62 tablet, Refills: 2    docusate sodium (COLACE)  100 MG capsule Take 1 capsule (100 mg total) by mouth 3 (three) times daily as needed for mild constipation. Qty: 30 capsule, Refills: 0    !! gabapentin (NEURONTIN) 300 MG capsule Take 1 capsule (300 mg total) by mouth 3 (three) times daily before meals. Qty: 90 capsule, Refills: 0    !! gabapentin (NEURONTIN) 300 MG capsule Take 2 capsules (600 mg total) by mouth at bedtime. Qty: 60 capsule, Refills: 0    metoprolol (LOPRESSOR) 50 MG tablet Take 1 tablet (50 mg total) by mouth 2 (two) times daily. Qty: 60 tablet, Refills: 0    morphine (MS CONTIN) 60 MG 12 hr tablet Take 60 mg by mouth every 8 (eight) hours.    polyethylene glycol (MIRALAX / GLYCOLAX) packet Take 17 g by mouth daily. Qty: 14 each, Refills: 0    psyllium (METAMUCIL) 58.6 % packet Take 1 packet by mouth 2 (two) times daily.    senna-docusate (SENOKOT-S) 8.6-50 MG tablet Take 1 tablet by mouth at bedtime.    tamsulosin (FLOMAX) 0.4 MG CAPS capsule Take 0.4 mg by mouth daily.     !! - Potential duplicate medications found. Please discuss with provider.     No Known Allergies    The results of significant diagnostics from this hospitalization (including imaging, microbiology, ancillary and laboratory) are listed below for reference.    Significant Diagnostic Studies: Ct Abdomen Pelvis Wo Contrast  06/05/2015   CLINICAL DATA:  Possible stroke. Patient is febrile, confused, and dizzy. Recent urinary retention.  EXAM: CT ABDOMEN AND PELVIS WITHOUT CONTRAST  TECHNIQUE: Multidetector CT imaging of the abdomen and pelvis was performed following the standard protocol without IV contrast.  COMPARISON:  None.  FINDINGS: Lower chest: Mild bibasilar atelectasis, right greater than left. Significant coronary artery disease. Heart size is normal.  Upper abdomen: No focal abnormality within the liver, spleen, pancreas, or adrenal glands. Within the lower pole of the right kidney there is a hyperdense 1.9 cm lesion possibly  representing a cyst but not characterized on this noncontrast exam. A 1.2 cm low-attenuation lesion is identified in the upper pole left kidney, likely representing a cyst. The gallbladder is present.  Gastrointestinal tract: The stomach and small bowel loops are normal in appearance. Large stool burden. The appendix is not seen.  Pelvis: There is significant streak artifact from right hip orthopedic hardware an electronic device in the right buttock for spinal stimulator. A Foley catheter is identified within the urinary bladder, decompressing the bladder. There is no free pelvic fluid.  Retroperitoneum: Significant atherosclerotic calcification of the abdominal aorta. No aneurysm. No retroperitoneal or mesenteric adenopathy.  Abdominal wall: Unremarkable.  Osseous structures: Right hip arthroplasty. Wedge compression deformity of L2, L1, and T12. Significant degenerative changes throughout the spine. No suspicious lytic or blastic lesions are identified.  IMPRESSION: 1.  No evidence for acute  abnormality. 2. Significant coronary artery disease. 3. Hyperdense lesion within the  lower pole of the right kidney, not characterized on this exam. Consider further evaluation with contrast-enhanced exam. MRI should be performed when the patient is clinically stable and able to follow breath holding instructions (usually best performed on an outpatient basis). 4. Large stool burden. 5. Appendix not seen. 6. Foley catheter decompresses the bladder. 7. Numerous wedge compression fractures in the lower thoracic and lumbar spine.   Electronically Signed   By: Nolon Nations M.D.   On: 06/05/2015 18:48   Dg Chest 2 View  06/15/2015   CLINICAL DATA:  Shortness of breath and fever. Diagnosed with pneumonia 1 week ago  EXAM: CHEST  2 VIEW  COMPARISON:  06/05/2015  FINDINGS: Normal heart size and stable aortic tortuosity.  Previously seen pulmonary edema is resolved. Airspace opacity on chest CT 7 days ago is not visualized.  There is no effusion or pneumothorax.  Remote mid right clavicle and L2 body fractures. Left glenohumeral arthroplasty. Dorsal column stimulator.  IMPRESSION: No evidence of active cardiopulmonary disease.   Electronically Signed   By: Monte Fantasia M.D.   On: 06/15/2015 21:43   Dg Chest 2 View  05/24/2015   CLINICAL DATA:  Shortness of Breath  EXAM: CHEST  2 VIEW  COMPARISON:  None.  FINDINGS: There is no edema or consolidation. The heart size and pulmonary vascularity are normal. No adenopathy. There is a stimulator with the tip in the mid thoracic region. No pneumothorax. There is a total shoulder replacement on the left. There are multiple wedge compression fractures of the lower thoracic and upper lumbar spine regions. There is evidence of old healed fracture of the right clavicle. There old healed fractures of the left posterior fourth, fifth, and sixth ribs. Bones appear osteoporotic.  IMPRESSION: No edema or consolidation. Bones osteoporotic. Heart size within normal limits.   Electronically Signed   By: Lowella Grip III M.D.   On: 05/24/2015 13:31   Dg Wrist Complete Left  06/15/2015   CLINICAL DATA:  Acute left wrist pain for 1 day.  No known injury.  EXAM: LEFT WRIST - COMPLETE 3+ VIEW  COMPARISON:  None.  FINDINGS: There is no evidence of acute fracture, subluxation or dislocation.  Degenerative changes within the wrist are noted with subluxation at the lunate- triquetrum reticulation.  Only small fragments of the trapezium are present.  Dorsal soft tissue swelling is noted.  IMPRESSION: Soft tissue swelling without acute bony abnormality.  Degenerative changes within the wrist with subluxation at the lunate -trapezium articulation and only small bony fragments of the trapezium remaining.   Electronically Signed   By: Margarette Canada M.D.   On: 06/15/2015 21:45   Dg Arthro Wrist Left  06/16/2015   CLINICAL DATA:  Severe left wrist pain  EXAM: EXAM LEFT WRIST ASPIRATION UNDER FLUOROSCOPY   FLUOROSCOPY TIME:  1.8 seconds  TECHNIQUE: The procedure, risks (including but not limited to bleeding, infection, organ damage ), benefits, and alternatives were explained to the patient. Questions regarding the procedure were encouraged and answered. The patient understands and consents to the procedure.  An appropriate skin entry site was determined under fluoroscopy. Skin site was marked, prepped with Betadine, and draped in usual sterile fashion, and infiltrated locally with 1% lidocaine.  21-gauge spinal needle advanced into the radiocarpal joint under intermittent fluoroscopy. 1.5 ml of cloudy yellow thin fluid were aspirated, sent for the requested laboratory studies. Needle was removed. The patient tolerated the procedure well.  COMPLICATIONS: COMPLICATIONS none  IMPRESSION: 1. Technically successful  left wrist aspiration under fluoroscopy   Electronically Signed   By: Lucrezia Europe M.D.   On: 06/16/2015 15:01   Dg Ankle Complete Right  06/10/2015   CLINICAL DATA:  Right anterior foot and ankle pain for 1 week.  EXAM: RIGHT ANKLE - COMPLETE 3+ VIEW  COMPARISON:  None.  FINDINGS: Mild degenerative changes in the right ankle. No acute bony abnormality. Specifically, no fracture, subluxation, or dislocation. Soft tissues are intact.  IMPRESSION: No acute bony abnormality.   Electronically Signed   By: Rolm Baptise M.D.   On: 06/10/2015 08:24   Ct Head Wo Contrast  06/07/2015   CLINICAL DATA:  Encephalopyosis  EXAM: CT HEAD WITHOUT CONTRAST  TECHNIQUE: Contiguous axial images were obtained from the base of the skull through the vertex without intravenous contrast.  COMPARISON:  None.  FINDINGS: Ventricles and sulci are appropriate for patient's age. No evidence for acute cortically based infarct, intracranial hemorrhage, mass lesion or mass-effect. The orbits are unremarkable. Paranasal sinuses are unremarkable. Mastoid air cells are well aerated. Calvarium is intact.  IMPRESSION: No acute intracranial  process.   Electronically Signed   By: Lovey Newcomer M.D.   On: 06/07/2015 15:44   Ct Chest W Contrast  06/08/2015   CLINICAL DATA:  Fever of unknown origin. Frequent small loose dark green stools with mucus. Abdominal pain.  EXAM: CT CHEST, ABDOMEN, AND PELVIS WITH CONTRAST  TECHNIQUE: Multidetector CT imaging of the chest, abdomen and pelvis was performed following the standard protocol during bolus administration of intravenous contrast.  CONTRAST:  77mL OMNIPAQUE IOHEXOL 300 MG/ML SOLN, 160mL OMNIPAQUE IOHEXOL 300 MG/ML SOLN  COMPARISON:  CT of the abdomen and pelvis on 06/05/2015  FINDINGS: CT CHEST FINDINGS  Heart: Heart size is normal. Significant coronary artery calcification. No pericardial effusion.  Vascular structures: There is atherosclerotic calcification of the thoracic aorta. Aorta is tortuous but not aneurysmal. Note is made of bovine arch anatomy.  Mediastinum/thyroid: There is a 1.5 cm low-attenuation nodule within the left lobe of the thyroid. Small mediastinal lymph nodes are identified. Small right peritracheal lymph node is 1.1 cm. Subcarinal lymph node is 1.5 cm. Small hiatal hernia is present.  Lungs/Airways: There is are focal areas of infiltrate within the left upper lobe and right upper lobe, likely infectious. There are scattered emphysematous changes. Small bilateral pleural effusions, right greater than left.  Chest wall/osseous: Significant degenerative changes are identified in the thoracic spine. Spinal stimulator identified with tip at T7. No suspicious lytic or blastic lesions are identified. Chronic compression fracture of T12.  CT ABDOMEN AND PELVIS FINDINGS  Upper abdomen: Small amount of fluid is identified around the liver. No focal abnormality identified within the liver, spleen, pancreas, or adrenal glands. Small cyst is identified within the upper pole of the left kidney. No hydronephrosis. Right kidney is unremarkable in appearance. Gallbladder is distended without  radiopaque calculi identified.  Gastrointestinal tract: The stomach and small bowel loops are normal in appearance. Colonic loops are normal in appearance. The appendix is not seen.  Pelvis: The urinary bladder is distended and contains a small amount of following recent Foley catheter. There has been development of a small amount of nonspecific fluid within the pelvis, identified within the pericolic gutters bilaterally and surrounding the base the bladder.  Retroperitoneum: There is atherosclerotic calcification of the abdominal aorta. Aorta is tortuous but not aneurysmal. No retroperitoneal or mesenteric adenopathy.  Abdominal wall: Unremarkable.  Osseous structures: Status post right hip arthroplasty. Significant degenerative changes  in the lumbar spine. Wedge compression fracture of L1 and L2 chronic.  IMPRESSION: 1. Focal areas of infiltrate within the left upper lobe and right upper lobe. Findings are likely infectious. 2. Interval development of a small amount of ascites and pelvic fluid. 3. Distended gallbladder without CT evidence for radiopaque calculi. 4. No bowel obstruction. 5. Significant coronary artery disease. 6. 1.5 cm nodule within the left lobe of the thyroid. Elective thyroid ultrasound is recommended based on consensus criteria. 7. Small hiatal hernia. 8. Spinal stimulator. 9. Numerous stable wedge compression fractures.   Electronically Signed   By: Nolon Nations M.D.   On: 06/08/2015 19:14   Ct Abdomen Pelvis W Contrast  06/08/2015   CLINICAL DATA:  Fever of unknown origin. Frequent small loose dark green stools with mucus. Abdominal pain.  EXAM: CT CHEST, ABDOMEN, AND PELVIS WITH CONTRAST  TECHNIQUE: Multidetector CT imaging of the chest, abdomen and pelvis was performed following the standard protocol during bolus administration of intravenous contrast.  CONTRAST:  84mL OMNIPAQUE IOHEXOL 300 MG/ML SOLN, 156mL OMNIPAQUE IOHEXOL 300 MG/ML SOLN  COMPARISON:  CT of the abdomen and pelvis  on 06/05/2015  FINDINGS: CT CHEST FINDINGS  Heart: Heart size is normal. Significant coronary artery calcification. No pericardial effusion.  Vascular structures: There is atherosclerotic calcification of the thoracic aorta. Aorta is tortuous but not aneurysmal. Note is made of bovine arch anatomy.  Mediastinum/thyroid: There is a 1.5 cm low-attenuation nodule within the left lobe of the thyroid. Small mediastinal lymph nodes are identified. Small right peritracheal lymph node is 1.1 cm. Subcarinal lymph node is 1.5 cm. Small hiatal hernia is present.  Lungs/Airways: There is are focal areas of infiltrate within the left upper lobe and right upper lobe, likely infectious. There are scattered emphysematous changes. Small bilateral pleural effusions, right greater than left.  Chest wall/osseous: Significant degenerative changes are identified in the thoracic spine. Spinal stimulator identified with tip at T7. No suspicious lytic or blastic lesions are identified. Chronic compression fracture of T12.  CT ABDOMEN AND PELVIS FINDINGS  Upper abdomen: Small amount of fluid is identified around the liver. No focal abnormality identified within the liver, spleen, pancreas, or adrenal glands. Small cyst is identified within the upper pole of the left kidney. No hydronephrosis. Right kidney is unremarkable in appearance. Gallbladder is distended without radiopaque calculi identified.  Gastrointestinal tract: The stomach and small bowel loops are normal in appearance. Colonic loops are normal in appearance. The appendix is not seen.  Pelvis: The urinary bladder is distended and contains a small amount of following recent Foley catheter. There has been development of a small amount of nonspecific fluid within the pelvis, identified within the pericolic gutters bilaterally and surrounding the base the bladder.  Retroperitoneum: There is atherosclerotic calcification of the abdominal aorta. Aorta is tortuous but not aneurysmal. No  retroperitoneal or mesenteric adenopathy.  Abdominal wall: Unremarkable.  Osseous structures: Status post right hip arthroplasty. Significant degenerative changes in the lumbar spine. Wedge compression fracture of L1 and L2 chronic.  IMPRESSION: 1. Focal areas of infiltrate within the left upper lobe and right upper lobe. Findings are likely infectious. 2. Interval development of a small amount of ascites and pelvic fluid. 3. Distended gallbladder without CT evidence for radiopaque calculi. 4. No bowel obstruction. 5. Significant coronary artery disease. 6. 1.5 cm nodule within the left lobe of the thyroid. Elective thyroid ultrasound is recommended based on consensus criteria. 7. Small hiatal hernia. 8. Spinal stimulator. 9. Numerous stable wedge  compression fractures.   Electronically Signed   By: Nolon Nations M.D.   On: 06/08/2015 19:14   Dg Chest Port 1 View  06/06/2015   CLINICAL DATA:  Sepsis.  Altered mental status.  EXAM: PORTABLE CHEST - 1 VIEW  COMPARISON:  06/05/2015  FINDINGS: Dorsal column stimulator remains in place.  Left humeral prosthesis.  Atherosclerotic aortic arch. Borderline enlargement of the cardiopericardial silhouette upper zone pulmonary vascular prominence may be due to positioning.  Right mid clavicular deformity from old fracture. Degenerative right glenohumeral arthropathy.  Thoracic spondylosis. Low lung volumes are present, causing crowding of the pulmonary vasculature.  IMPRESSION: 1. Borderline enlargement of the cardiopericardial silhouette. Cephalization of blood flow could be from pulmonary venous hypertension or supine positioning. 2. Atherosclerotic aortic arch. 3. Low lung volumes.   Electronically Signed   By: Van Clines M.D.   On: 06/06/2015 10:46   Dg Chest Port 1 View  06/05/2015   CLINICAL DATA:  Sepsis  EXAM: PORTABLE CHEST - 1 VIEW  COMPARISON:  05/24/2015  FINDINGS: There is vascular congestion. Low lung volumes with bibasilar atelectasis. No  effusions. Heart is upper limits normal in size. No acute bony abnormality.  IMPRESSION: Vascular congestion with low volumes and bibasilar atelectasis.   Electronically Signed   By: Rolm Baptise M.D.   On: 06/05/2015 15:18   Dg Foot Complete Right  06/09/2015   CLINICAL DATA:  69 year old male with right-sided foot pain for the past 2-3 days. No known injury.  EXAM: RIGHT FOOT COMPLETE - 3+ VIEW  COMPARISON:  No priors.  FINDINGS: Degenerative changes of osteoarthritis are noted at the first MTP joint. No acute displaced fracture, subluxation or dislocation is noted. Hammertoe deformities of toes 2 through 5.  IMPRESSION: 1. No acute radiographic abnormality of the right foot.   Electronically Signed   By: Vinnie Langton M.D.   On: 06/09/2015 17:21    Microbiology: Recent Results (from the past 240 hour(s))  Culture, blood (routine x 2)     Status: None (Preliminary result)   Collection Time: 06/15/15  8:05 PM  Result Value Ref Range Status   Specimen Description BLOOD RIGHT HAND  Final   Special Requests BOTTLES DRAWN AEROBIC AND ANAEROBIC 5CC  Final   Culture NO GROWTH 2 DAYS  Final   Report Status PENDING  Incomplete  Culture, blood (routine x 2)     Status: None (Preliminary result)   Collection Time: 06/15/15  9:00 PM  Result Value Ref Range Status   Specimen Description BLOOD RIGHT HAND  Final   Special Requests BOTTLES DRAWN AEROBIC ONLY 10CC  Final   Culture NO GROWTH 2 DAYS  Final   Report Status PENDING  Incomplete  Body fluid culture     Status: None (Preliminary result)   Collection Time: 06/16/15 12:59 AM  Result Value Ref Range Status   Specimen Description SYNOVIAL LEFT WRIST  Final   Special Requests NONE  Final   Gram Stain   Final    ABUNDANT WBC PRESENT,BOTH PMN AND MONONUCLEAR NO ORGANISMS SEEN    Culture NO GROWTH 1 DAY  Final   Report Status PENDING  Incomplete  Urine culture     Status: None   Collection Time: 06/16/15  2:22 AM  Result Value Ref Range  Status   Specimen Description URINE, CLEAN CATCH  Final   Special Requests NONE  Final   Culture NO GROWTH 1 DAY  Final   Report Status 06/17/2015 FINAL  Final  Body fluid culture     Status: None (Preliminary result)   Collection Time: 06/16/15  1:33 PM  Result Value Ref Range Status   Specimen Description SYNOVIAL LEFT WRIST  Final   Special Requests NONE  Final   Gram Stain ABUNDANT POLYMORPHONUCLEAR NO ORGANISMS SEEN   Final   Culture NO GROWTH < 24 HOURS  Final   Report Status PENDING  Incomplete     Labs: Basic Metabolic Panel:  Recent Labs Lab 06/12/15 0454 06/15/15 2108 06/16/15 0740 06/17/15 0629  NA 134* 132* 134* 133*  K 3.6 4.6 4.4 3.8  CL 100* 98* 103 100*  CO2 26 24 24 25   GLUCOSE 99 116* 99 165*  BUN 20 19 17 13   CREATININE 0.63 1.75* 1.04 0.76  CALCIUM 8.6* 8.6* 8.8* 8.6*   Liver Function Tests:  Recent Labs Lab 06/15/15 2108  AST 23  ALT 27  ALKPHOS 83  BILITOT 0.3  PROT 6.5  ALBUMIN 3.4*   No results for input(s): LIPASE, AMYLASE in the last 168 hours. No results for input(s): AMMONIA in the last 168 hours. CBC:  Recent Labs Lab 06/12/15 0454 06/15/15 2108 06/16/15 0740 06/17/15 0629  WBC 14.9* 21.1* 17.9* 18.2*  NEUTROABS  --  16.4*  --   --   HGB 10.6* 11.0* 10.5* 10.6*  HCT 31.8* 34.7* 32.9* 32.5*  MCV 87.6 89.0 88.4 88.1  PLT 436* 414* 381 351   Cardiac Enzymes: No results for input(s): CKTOTAL, CKMB, CKMBINDEX, TROPONINI in the last 168 hours. BNP: BNP (last 3 results) No results for input(s): BNP in the last 8760 hours.  ProBNP (last 3 results) No results for input(s): PROBNP in the last 8760 hours.  CBG:  Recent Labs Lab 06/11/15 2258  GLUCAP 113*       SignedNita Sells  [facility] 06/18/2015, 7:57 AM

## 2015-06-19 LAB — BODY FLUID CULTURE: CULTURE: NO GROWTH

## 2015-06-20 LAB — BODY FLUID CULTURE: Culture: NO GROWTH

## 2015-06-20 LAB — CULTURE, BLOOD (ROUTINE X 2)
CULTURE: NO GROWTH
Culture: NO GROWTH

## 2015-06-24 NOTE — Discharge Summary (Signed)
Physician Discharge Summary  William Jordan IRS:[id number] DOB: [birth year] DOA: 06/15/2015  PCP: [name], MD  Admit date: 06/15/2015 Discharge date: 06/24/2015  Time spent: 25 minutes  Recommendations for Outpatient Follow-up:  1. Steroids to be given in tapering fashion as dictated below 2. Needs complete metabolic panel and CBC maybe in about 2 weeks 3. Interval follow-up with primary care physician  4. No therapy needs on discharge 5. Suggest follow up of culture results from time of hand aspiration  Discharge Diagnoses:  Principal Problem:   Gout attack Active Problems:   Acute encephalopathy   Septic joint of left wrist ([place])   Hypertension   Leukocytosis   Septic joint ([place])   Discharge Condition: Fair  Diet recommendation: Heart healthy  Filed Weights   06/15/15 2044  Weight: 82.509 kg (181 lb 14.4 oz)    History of present illness:  69 y/o ? Recent admission 0/09--3/81 Metabolic encephalopathy/AKI with hyperkalemia +sepsis thought to be secondary to healthcare associated pneumonia-he was palced on precedex that admission as had metabolic encephalopathy Chr Pain Prostatic hypertrophy DJD L TKR x2 Possible gout in ankle  Re-admitted to hospital 9/30 with pain and swelling int eh L hand and Tmax >101 He is not sure if he has been taking medication for gout or not He was admitted and his wrist was aspirated on the left side where he had pain and significant swelling hand surgery saw the patient in consult and did not feel based on those results this was an infected joint although there was consideration of the same   Hospital Course:  Gouty arthritis -continue solumedrol till 10/3 -transition to PO steroids then -Expect will resolve slowly -Continue colchicine 0.2 bid -On discharge have placed him on prednisone 40 mg for 5 days and then a taper to 20 mg for 5 days and then stop -He should follow-up with his primary care physician as an  outpatient  Leukocytosis -combination of gout and steroids -monitor  Prostate hypertrophy -cont flomax 0.4 daily  Chr Pain -states is from neuropathy -he will need to follow with pcp for chr pain meds of morphione 60q8 -contionue Gabapentin 300 tid and 600 qhs  htn continue amlodipine 10 daily  Metoprolol 50 bid Only mod controlled 2/2 to severe pain  Recent community-acquired pneumonia-completed all antibiotics does not need any further     Discharge Exam: Filed Vitals:   06/18/15 1300  BP: 161/81  Pulse: 61  Temp: 97.5 F (36.4 C)  Resp:     General: EOMI NCAT Cardiovascular: S1-S2 no murmur rub or gallop Respiratory: Clinically clear  Discharge Instructions   Discharge Instructions    Diet - low sodium heart healthy    Complete by:  As directed      Discharge instructions    Complete by:  As directed   Take 40 mg dose of prednisone for 5 days until 10/8  then take 30 mg dose for 5 days until 10/12 Then stopped the steroids Continue colchicine please Follow-up with her primary physician     Increase activity slowly    Complete by:  As directed           Discharge Medication List as of 06/18/2015  6:01 PM    START taking these medications   Details  !! predniSONE (DELTASONE) 20 MG tablet Take 1 tablet (20 mg total) by mouth daily before breakfast., Starting 06/23/2015, Until Discontinued, Normal    !! predniSONE (DELTASONE) 20 MG tablet Take 2 tablets (40 mg total)  by mouth daily before breakfast., Starting 06/18/2015, Until Discontinued, Normal     !! - Potential duplicate medications found. Please discuss with provider.    CONTINUE these medications which have NOT CHANGED   Details  citalopram (CELEXA) 10 MG tablet Take 20 mg by mouth daily., Until Discontinued, Historical Med    docusate sodium (COLACE) 100 MG capsule Take 1 capsule (100 mg total) by mouth 3 (three) times daily as needed for mild constipation., Starting 09/28/2014, Until  Discontinued, Print    morphine (MS CONTIN) 60 MG 12 hr tablet Take 60 mg by mouth every 8 (eight) hours., Until Discontinued, Historical Med    psyllium (METAMUCIL) 58.6 % packet Take 1 packet by mouth 2 (two) times daily., Until Discontinued, Historical Med    amLODipine (NORVASC) 10 MG tablet Take 1 tablet (10 mg total) by mouth daily., Starting 06/12/2015, Until Discontinued, Print    colchicine (COLCRYS) 0.6 MG tablet Take 1 tablet (0.6 mg total) by mouth 2 (two) times daily., Starting 06/12/2015, Until Discontinued, Print    !! gabapentin (NEURONTIN) 300 MG capsule Take 1 capsule (300 mg total) by mouth 3 (three) times daily before meals., Starting 06/12/2015, Until Discontinued, Print    !! gabapentin (NEURONTIN) 300 MG capsule Take 2 capsules (600 mg total) by mouth at bedtime., Starting 06/12/2015, Until Discontinued, Print    metoprolol (LOPRESSOR) 50 MG tablet Take 1 tablet (50 mg total) by mouth 2 (two) times daily., Starting 06/12/2015, Until Discontinued, Print    polyethylene glycol (MIRALAX / GLYCOLAX) packet Take 17 g by mouth daily., Starting 06/12/2015, Until Discontinued, Print    senna-docusate (SENOKOT-S) 8.6-50 MG tablet Take 1 tablet by mouth at bedtime., Starting 06/12/2015, Until Discontinued, No Print    tamsulosin (FLOMAX) 0.4 MG CAPS capsule Take 0.4 mg by mouth daily., Until Discontinued, Historical Med     !! - Potential duplicate medications found. Please discuss with provider.     No Known Allergies    The results of significant diagnostics from this hospitalization (including imaging, microbiology, ancillary and laboratory) are listed below for reference.    Significant Diagnostic Studies: Ct Abdomen Pelvis Wo Contrast  06/05/2015   CLINICAL DATA:  Possible stroke. Patient is febrile, confused, and dizzy. Recent urinary retention.  EXAM: CT ABDOMEN AND PELVIS WITHOUT CONTRAST  TECHNIQUE: Multidetector CT imaging of the abdomen and pelvis was performed  following the standard protocol without IV contrast.  COMPARISON:  None.  FINDINGS: Lower chest: Mild bibasilar atelectasis, right greater than left. Significant coronary artery disease. Heart size is normal.  Upper abdomen: No focal abnormality within the liver, spleen, pancreas, or adrenal glands. Within the lower pole of the right kidney there is a hyperdense 1.9 cm lesion possibly representing a cyst but not characterized on this noncontrast exam. A 1.2 cm low-attenuation lesion is identified in the upper pole left kidney, likely representing a cyst. The gallbladder is present.  Gastrointestinal tract: The stomach and small bowel loops are normal in appearance. Large stool burden. The appendix is not seen.  Pelvis: There is significant streak artifact from right hip orthopedic hardware an electronic device in the right buttock for spinal stimulator. A Foley catheter is identified within the urinary bladder, decompressing the bladder. There is no free pelvic fluid.  Retroperitoneum: Significant atherosclerotic calcification of the abdominal aorta. No aneurysm. No retroperitoneal or mesenteric adenopathy.  Abdominal wall: Unremarkable.  Osseous structures: Right hip arthroplasty. Wedge compression deformity of L2, L1, and T12. Significant degenerative changes throughout the spine. No  suspicious lytic or blastic lesions are identified.  IMPRESSION: 1.  No evidence for acute  abnormality. 2. Significant coronary artery disease. 3. Hyperdense lesion within the lower pole of the right kidney, not characterized on this exam. Consider further evaluation with contrast-enhanced exam. MRI should be performed when the patient is clinically stable and able to follow breath holding instructions (usually best performed on an outpatient basis). 4. Large stool burden. 5. Appendix not seen. 6. Foley catheter decompresses the bladder. 7. Numerous wedge compression fractures in the lower thoracic and lumbar spine.   Electronically  Signed   By: Nolon Nations M.D.   On: 06/05/2015 18:48   Dg Chest 2 View  06/15/2015   CLINICAL DATA:  Shortness of breath and fever. Diagnosed with pneumonia 1 week ago  EXAM: CHEST  2 VIEW  COMPARISON:  06/05/2015  FINDINGS: Normal heart size and stable aortic tortuosity.  Previously seen pulmonary edema is resolved. Airspace opacity on chest CT 7 days ago is not visualized. There is no effusion or pneumothorax.  Remote mid right clavicle and L2 body fractures. Left glenohumeral arthroplasty. Dorsal column stimulator.  IMPRESSION: No evidence of active cardiopulmonary disease.   Electronically Signed   By: Monte Fantasia M.D.   On: 06/15/2015 21:43   Dg Wrist Complete Left  06/15/2015   CLINICAL DATA:  Acute left wrist pain for 1 day.  No known injury.  EXAM: LEFT WRIST - COMPLETE 3+ VIEW  COMPARISON:  None.  FINDINGS: There is no evidence of acute fracture, subluxation or dislocation.  Degenerative changes within the wrist are noted with subluxation at the lunate- triquetrum reticulation.  Only small fragments of the trapezium are present.  Dorsal soft tissue swelling is noted.  IMPRESSION: Soft tissue swelling without acute bony abnormality.  Degenerative changes within the wrist with subluxation at the lunate -trapezium articulation and only small bony fragments of the trapezium remaining.   Electronically Signed   By: Margarette Canada M.D.   On: 06/15/2015 21:45   Dg Arthro Wrist Left  06/16/2015   CLINICAL DATA:  Severe left wrist pain  EXAM: EXAM LEFT WRIST ASPIRATION UNDER FLUOROSCOPY  FLUOROSCOPY TIME:  1.8 seconds  TECHNIQUE: The procedure, risks (including but not limited to bleeding, infection, organ damage ), benefits, and alternatives were explained to the patient. Questions regarding the procedure were encouraged and answered. The patient understands and consents to the procedure.  An appropriate skin entry site was determined under fluoroscopy. Skin site was marked, prepped with Betadine,  and draped in usual sterile fashion, and infiltrated locally with 1% lidocaine.  21-gauge spinal needle advanced into the radiocarpal joint under intermittent fluoroscopy. 1.5 ml of cloudy yellow thin fluid were aspirated, sent for the requested laboratory studies. Needle was removed. The patient tolerated the procedure well.  COMPLICATIONS: COMPLICATIONS none  IMPRESSION: 1. Technically successful left wrist aspiration under fluoroscopy   Electronically Signed   By: Lucrezia Europe M.D.   On: 06/16/2015 15:01   Dg Ankle Complete Right  06/10/2015   CLINICAL DATA:  Right anterior foot and ankle pain for 1 week.  EXAM: RIGHT ANKLE - COMPLETE 3+ VIEW  COMPARISON:  None.  FINDINGS: Mild degenerative changes in the right ankle. No acute bony abnormality. Specifically, no fracture, subluxation, or dislocation. Soft tissues are intact.  IMPRESSION: No acute bony abnormality.   Electronically Signed   By: Rolm Baptise M.D.   On: 06/10/2015 08:24   Ct Head Wo Contrast  06/07/2015   CLINICAL DATA:  Encephalopyosis  EXAM: CT HEAD WITHOUT CONTRAST  TECHNIQUE: Contiguous axial images were obtained from the base of the skull through the vertex without intravenous contrast.  COMPARISON:  None.  FINDINGS: Ventricles and sulci are appropriate for patient's age. No evidence for acute cortically based infarct, intracranial hemorrhage, mass lesion or mass-effect. The orbits are unremarkable. Paranasal sinuses are unremarkable. Mastoid air cells are well aerated. Calvarium is intact.  IMPRESSION: No acute intracranial process.   Electronically Signed   By: Lovey Newcomer M.D.   On: 06/07/2015 15:44   Ct Chest W Contrast  06/08/2015   CLINICAL DATA:  Fever of unknown origin. Frequent small loose dark green stools with mucus. Abdominal pain.  EXAM: CT CHEST, ABDOMEN, AND PELVIS WITH CONTRAST  TECHNIQUE: Multidetector CT imaging of the chest, abdomen and pelvis was performed following the standard protocol during bolus administration of  intravenous contrast.  CONTRAST:  53mL OMNIPAQUE IOHEXOL 300 MG/ML SOLN, 115mL OMNIPAQUE IOHEXOL 300 MG/ML SOLN  COMPARISON:  CT of the abdomen and pelvis on 06/05/2015  FINDINGS: CT CHEST FINDINGS  Heart: Heart size is normal. Significant coronary artery calcification. No pericardial effusion.  Vascular structures: There is atherosclerotic calcification of the thoracic aorta. Aorta is tortuous but not aneurysmal. Note is made of bovine arch anatomy.  Mediastinum/thyroid: There is a 1.5 cm low-attenuation nodule within the left lobe of the thyroid. Small mediastinal lymph nodes are identified. Small right peritracheal lymph node is 1.1 cm. Subcarinal lymph node is 1.5 cm. Small hiatal hernia is present.  Lungs/Airways: There is are focal areas of infiltrate within the left upper lobe and right upper lobe, likely infectious. There are scattered emphysematous changes. Small bilateral pleural effusions, right greater than left.  Chest wall/osseous: Significant degenerative changes are identified in the thoracic spine. Spinal stimulator identified with tip at T7. No suspicious lytic or blastic lesions are identified. Chronic compression fracture of T12.  CT ABDOMEN AND PELVIS FINDINGS  Upper abdomen: Small amount of fluid is identified around the liver. No focal abnormality identified within the liver, spleen, pancreas, or adrenal glands. Small cyst is identified within the upper pole of the left kidney. No hydronephrosis. Right kidney is unremarkable in appearance. Gallbladder is distended without radiopaque calculi identified.  Gastrointestinal tract: The stomach and small bowel loops are normal in appearance. Colonic loops are normal in appearance. The appendix is not seen.  Pelvis: The urinary bladder is distended and contains a small amount of following recent Foley catheter. There has been development of a small amount of nonspecific fluid within the pelvis, identified within the pericolic gutters bilaterally and  surrounding the base the bladder.  Retroperitoneum: There is atherosclerotic calcification of the abdominal aorta. Aorta is tortuous but not aneurysmal. No retroperitoneal or mesenteric adenopathy.  Abdominal wall: Unremarkable.  Osseous structures: Status post right hip arthroplasty. Significant degenerative changes in the lumbar spine. Wedge compression fracture of L1 and L2 chronic.  IMPRESSION: 1. Focal areas of infiltrate within the left upper lobe and right upper lobe. Findings are likely infectious. 2. Interval development of a small amount of ascites and pelvic fluid. 3. Distended gallbladder without CT evidence for radiopaque calculi. 4. No bowel obstruction. 5. Significant coronary artery disease. 6. 1.5 cm nodule within the left lobe of the thyroid. Elective thyroid ultrasound is recommended based on consensus criteria. 7. Small hiatal hernia. 8. Spinal stimulator. 9. Numerous stable wedge compression fractures.   Electronically Signed   By: Nolon Nations M.D.   On: 06/08/2015 19:14  Ct Abdomen Pelvis W Contrast  06/08/2015   CLINICAL DATA:  Fever of unknown origin. Frequent small loose dark green stools with mucus. Abdominal pain.  EXAM: CT CHEST, ABDOMEN, AND PELVIS WITH CONTRAST  TECHNIQUE: Multidetector CT imaging of the chest, abdomen and pelvis was performed following the standard protocol during bolus administration of intravenous contrast.  CONTRAST:  51mL OMNIPAQUE IOHEXOL 300 MG/ML SOLN, 136mL OMNIPAQUE IOHEXOL 300 MG/ML SOLN  COMPARISON:  CT of the abdomen and pelvis on 06/05/2015  FINDINGS: CT CHEST FINDINGS  Heart: Heart size is normal. Significant coronary artery calcification. No pericardial effusion.  Vascular structures: There is atherosclerotic calcification of the thoracic aorta. Aorta is tortuous but not aneurysmal. Note is made of bovine arch anatomy.  Mediastinum/thyroid: There is a 1.5 cm low-attenuation nodule within the left lobe of the thyroid. Small mediastinal lymph  nodes are identified. Small right peritracheal lymph node is 1.1 cm. Subcarinal lymph node is 1.5 cm. Small hiatal hernia is present.  Lungs/Airways: There is are focal areas of infiltrate within the left upper lobe and right upper lobe, likely infectious. There are scattered emphysematous changes. Small bilateral pleural effusions, right greater than left.  Chest wall/osseous: Significant degenerative changes are identified in the thoracic spine. Spinal stimulator identified with tip at T7. No suspicious lytic or blastic lesions are identified. Chronic compression fracture of T12.  CT ABDOMEN AND PELVIS FINDINGS  Upper abdomen: Small amount of fluid is identified around the liver. No focal abnormality identified within the liver, spleen, pancreas, or adrenal glands. Small cyst is identified within the upper pole of the left kidney. No hydronephrosis. Right kidney is unremarkable in appearance. Gallbladder is distended without radiopaque calculi identified.  Gastrointestinal tract: The stomach and small bowel loops are normal in appearance. Colonic loops are normal in appearance. The appendix is not seen.  Pelvis: The urinary bladder is distended and contains a small amount of following recent Foley catheter. There has been development of a small amount of nonspecific fluid within the pelvis, identified within the pericolic gutters bilaterally and surrounding the base the bladder.  Retroperitoneum: There is atherosclerotic calcification of the abdominal aorta. Aorta is tortuous but not aneurysmal. No retroperitoneal or mesenteric adenopathy.  Abdominal wall: Unremarkable.  Osseous structures: Status post right hip arthroplasty. Significant degenerative changes in the lumbar spine. Wedge compression fracture of L1 and L2 chronic.  IMPRESSION: 1. Focal areas of infiltrate within the left upper lobe and right upper lobe. Findings are likely infectious. 2. Interval development of a small amount of ascites and pelvic  fluid. 3. Distended gallbladder without CT evidence for radiopaque calculi. 4. No bowel obstruction. 5. Significant coronary artery disease. 6. 1.5 cm nodule within the left lobe of the thyroid. Elective thyroid ultrasound is recommended based on consensus criteria. 7. Small hiatal hernia. 8. Spinal stimulator. 9. Numerous stable wedge compression fractures.   Electronically Signed   By: Nolon Nations M.D.   On: 06/08/2015 19:14   Dg Chest Port 1 View  06/06/2015   CLINICAL DATA:  Sepsis.  Altered mental status.  EXAM: PORTABLE CHEST - 1 VIEW  COMPARISON:  06/05/2015  FINDINGS: Dorsal column stimulator remains in place.  Left humeral prosthesis.  Atherosclerotic aortic arch. Borderline enlargement of the cardiopericardial silhouette upper zone pulmonary vascular prominence may be due to positioning.  Right mid clavicular deformity from old fracture. Degenerative right glenohumeral arthropathy.  Thoracic spondylosis. Low lung volumes are present, causing crowding of the pulmonary vasculature.  IMPRESSION: 1. Borderline enlargement of the  cardiopericardial silhouette. Cephalization of blood flow could be from pulmonary venous hypertension or supine positioning. 2. Atherosclerotic aortic arch. 3. Low lung volumes.   Electronically Signed   By: Van Clines M.D.   On: 06/06/2015 10:46   Dg Chest Port 1 View  06/05/2015   CLINICAL DATA:  Sepsis  EXAM: PORTABLE CHEST - 1 VIEW  COMPARISON:  05/24/2015  FINDINGS: There is vascular congestion. Low lung volumes with bibasilar atelectasis. No effusions. Heart is upper limits normal in size. No acute bony abnormality.  IMPRESSION: Vascular congestion with low volumes and bibasilar atelectasis.   Electronically Signed   By: Rolm Baptise M.D.   On: 06/05/2015 15:18   Dg Foot Complete Right  06/09/2015   CLINICAL DATA:  69 year old male with right-sided foot pain for the past 2-3 days. No known injury.  EXAM: RIGHT FOOT COMPLETE - 3+ VIEW  COMPARISON:  No priors.   FINDINGS: Degenerative changes of osteoarthritis are noted at the first MTP joint. No acute displaced fracture, subluxation or dislocation is noted. Hammertoe deformities of toes 2 through 5.  IMPRESSION: 1. No acute radiographic abnormality of the right foot.   Electronically Signed   By: Vinnie Langton M.D.   On: 06/09/2015 17:21    Microbiology: Recent Results (from the past 240 hour(s))  Culture, blood (routine x 2)     Status: None   Collection Time: 06/15/15  8:05 PM  Result Value Ref Range Status   Specimen Description BLOOD RIGHT HAND  Final   Special Requests BOTTLES DRAWN AEROBIC AND ANAEROBIC 5CC  Final   Culture NO GROWTH 5 DAYS  Final   Report Status 06/20/2015 FINAL  Final  Culture, blood (routine x 2)     Status: None   Collection Time: 06/15/15  9:00 PM  Result Value Ref Range Status   Specimen Description BLOOD RIGHT HAND  Final   Special Requests BOTTLES DRAWN AEROBIC ONLY 10CC  Final   Culture NO GROWTH 5 DAYS  Final   Report Status 06/20/2015 FINAL  Final  Body fluid culture     Status: None   Collection Time: 06/16/15 12:59 AM  Result Value Ref Range Status   Specimen Description SYNOVIAL LEFT WRIST  Final   Special Requests NONE  Final   Gram Stain   Final    ABUNDANT WBC PRESENT,BOTH PMN AND MONONUCLEAR NO ORGANISMS SEEN    Culture NO GROWTH 3 DAYS  Final   Report Status 06/19/2015 FINAL  Final  Urine culture     Status: None   Collection Time: 06/16/15  2:22 AM  Result Value Ref Range Status   Specimen Description URINE, CLEAN CATCH  Final   Special Requests NONE  Final   Culture NO GROWTH 1 DAY  Final   Report Status 06/17/2015 FINAL  Final  Body fluid culture     Status: None   Collection Time: 06/16/15  1:33 PM  Result Value Ref Range Status   Specimen Description SYNOVIAL LEFT WRIST  Final   Special Requests NONE  Final   Gram Stain ABUNDANT POLYMORPHONUCLEAR NO ORGANISMS SEEN   Final   Culture NO GROWTH 3 DAYS  Final   Report Status  06/20/2015 FINAL  Final     Labs: Basic Metabolic Panel: No results for input(s): NA, K, CL, CO2, GLUCOSE, BUN, CREATININE, CALCIUM, MG, PHOS in the last 168 hours. Liver Function Tests: No results for input(s): AST, ALT, ALKPHOS, BILITOT, PROT, ALBUMIN in the last 168 hours. No results  for input(s): LIPASE, AMYLASE in the last 168 hours. No results for input(s): AMMONIA in the last 168 hours. CBC: No results for input(s): WBC, NEUTROABS, HGB, HCT, MCV, PLT in the last 168 hours. Cardiac Enzymes: No results for input(s): CKTOTAL, CKMB, CKMBINDEX, TROPONINI in the last 168 hours. BNP: BNP (last 3 results) No results for input(s): BNP in the last 8760 hours.  ProBNP (last 3 results) No results for input(s): PROBNP in the last 8760 hours.  CBG: No results for input(s): GLUCAP in the last 168 hours.     SignedNita Sells  [facility] 06/24/2015, 4:36 PM

## 2015-07-06 DIAGNOSIS — M25532 Pain in left wrist: Secondary | ICD-10-CM | POA: Insufficient documentation

## 2015-08-14 ENCOUNTER — Emergency Department (HOSPITAL_COMMUNITY): Payer: Medicare Other

## 2015-08-14 ENCOUNTER — Encounter (HOSPITAL_COMMUNITY): Payer: Self-pay | Admitting: Emergency Medicine

## 2015-08-14 ENCOUNTER — Inpatient Hospital Stay (HOSPITAL_COMMUNITY)
Admission: EM | Admit: 2015-08-14 | Discharge: 2015-08-17 | DRG: 193 | Disposition: A | Discharge status: Home Health | Payer: Medicare Other | Attending: Internal Medicine | Admitting: Internal Medicine

## 2015-08-14 DIAGNOSIS — G8929 Other chronic pain: Secondary | ICD-10-CM | POA: Diagnosis present

## 2015-08-14 DIAGNOSIS — M62838 Other muscle spasm: Secondary | ICD-10-CM | POA: Diagnosis not present

## 2015-08-14 DIAGNOSIS — R0902 Hypoxemia: Secondary | ICD-10-CM | POA: Diagnosis not present

## 2015-08-14 DIAGNOSIS — Z8679 Personal history of other diseases of the circulatory system: Secondary | ICD-10-CM | POA: Diagnosis present

## 2015-08-14 DIAGNOSIS — M545 Low back pain: Secondary | ICD-10-CM | POA: Diagnosis not present

## 2015-08-14 DIAGNOSIS — Z96641 Presence of right artificial hip joint: Secondary | ICD-10-CM | POA: Diagnosis present

## 2015-08-14 DIAGNOSIS — Z79891 Long term (current) use of opiate analgesic: Secondary | ICD-10-CM | POA: Diagnosis not present

## 2015-08-14 DIAGNOSIS — J189 Pneumonia, unspecified organism: Principal | ICD-10-CM | POA: Diagnosis present

## 2015-08-14 DIAGNOSIS — E875 Hyperkalemia: Secondary | ICD-10-CM | POA: Diagnosis present

## 2015-08-14 DIAGNOSIS — N179 Acute kidney failure, unspecified: Secondary | ICD-10-CM | POA: Diagnosis present

## 2015-08-14 DIAGNOSIS — Y95 Nosocomial condition: Secondary | ICD-10-CM | POA: Diagnosis not present

## 2015-08-14 DIAGNOSIS — G9341 Metabolic encephalopathy: Secondary | ICD-10-CM | POA: Diagnosis not present

## 2015-08-14 DIAGNOSIS — I10 Essential (primary) hypertension: Secondary | ICD-10-CM | POA: Diagnosis present

## 2015-08-14 DIAGNOSIS — E611 Iron deficiency: Secondary | ICD-10-CM | POA: Diagnosis present

## 2015-08-14 DIAGNOSIS — G9340 Encephalopathy, unspecified: Secondary | ICD-10-CM | POA: Diagnosis not present

## 2015-08-14 DIAGNOSIS — Z96612 Presence of left artificial shoulder joint: Secondary | ICD-10-CM | POA: Diagnosis present

## 2015-08-14 DIAGNOSIS — Z7952 Long term (current) use of systemic steroids: Secondary | ICD-10-CM | POA: Diagnosis not present

## 2015-08-14 DIAGNOSIS — M549 Dorsalgia, unspecified: Secondary | ICD-10-CM | POA: Diagnosis not present

## 2015-08-14 DIAGNOSIS — M62830 Muscle spasm of back: Secondary | ICD-10-CM | POA: Diagnosis present

## 2015-08-14 DIAGNOSIS — F329 Major depressive disorder, single episode, unspecified: Secondary | ICD-10-CM | POA: Diagnosis not present

## 2015-08-14 DIAGNOSIS — F419 Anxiety disorder, unspecified: Secondary | ICD-10-CM | POA: Diagnosis not present

## 2015-08-14 DIAGNOSIS — F17210 Nicotine dependence, cigarettes, uncomplicated: Secondary | ICD-10-CM | POA: Diagnosis present

## 2015-08-14 DIAGNOSIS — T50905A Adverse effect of unspecified drugs, medicaments and biological substances, initial encounter: Secondary | ICD-10-CM | POA: Diagnosis present

## 2015-08-14 DIAGNOSIS — D649 Anemia, unspecified: Secondary | ICD-10-CM | POA: Diagnosis present

## 2015-08-14 DIAGNOSIS — E860 Dehydration: Secondary | ICD-10-CM | POA: Diagnosis present

## 2015-08-14 DIAGNOSIS — E876 Hypokalemia: Secondary | ICD-10-CM | POA: Diagnosis not present

## 2015-08-14 DIAGNOSIS — Z79899 Other long term (current) drug therapy: Secondary | ICD-10-CM

## 2015-08-14 DIAGNOSIS — Z96652 Presence of left artificial knee joint: Secondary | ICD-10-CM | POA: Diagnosis present

## 2015-08-14 DIAGNOSIS — R4182 Altered mental status, unspecified: Secondary | ICD-10-CM | POA: Diagnosis present

## 2015-08-14 DIAGNOSIS — R400 Somnolence: Secondary | ICD-10-CM | POA: Diagnosis not present

## 2015-08-14 DIAGNOSIS — R1011 Right upper quadrant pain: Secondary | ICD-10-CM

## 2015-08-14 HISTORY — DX: Dorsalgia, unspecified: M54.9

## 2015-08-14 HISTORY — DX: Other chronic pain: G89.29

## 2015-08-14 LAB — COMPREHENSIVE METABOLIC PANEL
ALBUMIN: 3.4 g/dL — AB (ref 3.5–5.0)
ALK PHOS: 65 U/L (ref 38–126)
ALT: 19 U/L (ref 17–63)
AST: 42 U/L — AB (ref 15–41)
Anion gap: 13 (ref 5–15)
BUN: 47 mg/dL — ABNORMAL HIGH (ref 6–20)
CALCIUM: 8.8 mg/dL — AB (ref 8.9–10.3)
CHLORIDE: 91 mmol/L — AB (ref 101–111)
CO2: 27 mmol/L (ref 22–32)
CREATININE: 2.26 mg/dL — AB (ref 0.61–1.24)
GFR calc non Af Amer: 28 mL/min — ABNORMAL LOW (ref >60)
GFR, EST AFRICAN AMERICAN: 33 mL/min — AB (ref >60)
GLUCOSE: 102 mg/dL — AB (ref 65–99)
Potassium: 5.6 mmol/L — ABNORMAL HIGH (ref 3.5–5.1)
SODIUM: 131 mmol/L — AB (ref 135–145)
Total Bilirubin: 0.6 mg/dL (ref 0.3–1.2)
Total Protein: 6.1 g/dL — ABNORMAL LOW (ref 6.5–8.1)

## 2015-08-14 LAB — GLUCOSE, CAPILLARY: Glucose-Capillary: 115 mg/dL — ABNORMAL HIGH (ref 65–99)

## 2015-08-14 LAB — RAPID URINE DRUG SCREEN, HOSP PERFORMED
AMPHETAMINES: NOT DETECTED
BARBITURATES: NOT DETECTED
Benzodiazepines: NOT DETECTED
COCAINE: NOT DETECTED
OPIATES: POSITIVE — AB
TETRAHYDROCANNABINOL: NOT DETECTED

## 2015-08-14 LAB — CBC WITH DIFFERENTIAL/PLATELET
BASOS ABS: 0 10*3/uL (ref 0.0–0.1)
BASOS PCT: 0 %
EOS ABS: 0 10*3/uL (ref 0.0–0.7)
EOS PCT: 0 %
HCT: 29.8 % — ABNORMAL LOW (ref 39.0–52.0)
HEMOGLOBIN: 9.6 g/dL — AB (ref 13.0–17.0)
LYMPHS ABS: 1.7 10*3/uL (ref 0.7–4.0)
Lymphocytes Relative: 13 %
MCH: 27.9 pg (ref 26.0–34.0)
MCHC: 32.2 g/dL (ref 30.0–36.0)
MCV: 86.6 fL (ref 78.0–100.0)
Monocytes Absolute: 0.7 10*3/uL (ref 0.1–1.0)
Monocytes Relative: 6 %
NEUTROS PCT: 81 %
Neutro Abs: 10.7 10*3/uL — ABNORMAL HIGH (ref 1.7–7.7)
PLATELETS: 210 10*3/uL (ref 150–400)
RBC: 3.44 MIL/uL — AB (ref 4.22–5.81)
RDW: 15 % (ref 11.5–15.5)
WBC: 13.1 10*3/uL — AB (ref 4.0–10.5)

## 2015-08-14 LAB — URINALYSIS, ROUTINE W REFLEX MICROSCOPIC
BILIRUBIN URINE: NEGATIVE
Glucose, UA: NEGATIVE mg/dL
HGB URINE DIPSTICK: NEGATIVE
Ketones, ur: 15 mg/dL — AB
Leukocytes, UA: NEGATIVE
NITRITE: NEGATIVE
PH: 5 (ref 5.0–8.0)
Protein, ur: NEGATIVE mg/dL
SPECIFIC GRAVITY, URINE: 1.017 (ref 1.005–1.030)

## 2015-08-14 LAB — I-STAT CG4 LACTIC ACID, ED: Lactic Acid, Venous: 0.95 mmol/L (ref 0.5–2.0)

## 2015-08-14 MED ORDER — CEFTRIAXONE SODIUM 1 G IJ SOLR: 1.0000 g | Freq: Once | INTRAMUSCULAR | Status: DC

## 2015-08-14 MED ORDER — ACETAMINOPHEN 650 MG RE SUPP: 650.0000 mg | Freq: Four times a day (QID) | RECTAL | Status: DC | PRN

## 2015-08-14 MED ORDER — FOLIC ACID 1 MG PO TABS
1.0000 mg | ORAL_TABLET | Freq: Every day | ORAL | Status: DC
  Administered 2015-08-15 – 2015-08-17 (×3): 1 mg via ORAL
  Filled 2015-08-14 (×3): qty 1

## 2015-08-14 MED ORDER — ADULT MULTIVITAMIN W/MINERALS CH
1.0000 | ORAL_TABLET | Freq: Every day | ORAL | Status: DC
  Administered 2015-08-15 – 2015-08-17 (×3): 1 via ORAL
  Filled 2015-08-14 (×3): qty 1

## 2015-08-14 MED ORDER — PIPERACILLIN-TAZOBACTAM IN DEX 2-0.25 GM/50ML IV SOLN: 2.2500 g | Freq: Once | INTRAVENOUS | Status: DC

## 2015-08-14 MED ORDER — VITAMIN B-1 100 MG PO TABS
100.0000 mg | ORAL_TABLET | Freq: Every day | ORAL | Status: DC
  Administered 2015-08-15 – 2015-08-17 (×3): 100 mg via ORAL
  Filled 2015-08-14 (×3): qty 1

## 2015-08-14 MED ORDER — VANCOMYCIN HCL 10 G IV SOLR
1250.0000 mg | INTRAVENOUS | Status: DC
  Administered 2015-08-14: 1250 mg via INTRAVENOUS
  Filled 2015-08-14 (×2): qty 1250

## 2015-08-14 MED ORDER — SODIUM CHLORIDE 0.9 % IV SOLN
INTRAVENOUS | Status: DC
  Administered 2015-08-14: 1000 mL via INTRAVENOUS

## 2015-08-14 MED ORDER — MORPHINE SULFATE ER 60 MG PO TBCR
60.0000 mg | EXTENDED_RELEASE_TABLET | Freq: Three times a day (TID) | ORAL | Status: DC
  Administered 2015-08-14 – 2015-08-17 (×8): 60 mg via ORAL
  Filled 2015-08-14 (×8): qty 1

## 2015-08-14 MED ORDER — PIPERACILLIN-TAZOBACTAM 3.375 G IVPB 30 MIN
3.3750 g | Freq: Once | INTRAVENOUS | Status: AC
  Administered 2015-08-14: 3.375 g via INTRAVENOUS
  Filled 2015-08-14: qty 50

## 2015-08-14 MED ORDER — DOCUSATE SODIUM 100 MG PO CAPS
100.0000 mg | ORAL_CAPSULE | Freq: Three times a day (TID) | ORAL | Status: DC | PRN
  Administered 2015-08-14: 100 mg via ORAL
  Filled 2015-08-14: qty 1

## 2015-08-14 MED ORDER — GABAPENTIN 600 MG PO TABS
600.0000 mg | ORAL_TABLET | Freq: Every day | ORAL | Status: DC
  Administered 2015-08-14 – 2015-08-17 (×13): 600 mg via ORAL
  Filled 2015-08-14 (×13): qty 1

## 2015-08-14 MED ORDER — TAMSULOSIN HCL 0.4 MG PO CAPS
0.4000 mg | ORAL_CAPSULE | Freq: Every day | ORAL | Status: DC
  Administered 2015-08-15 – 2015-08-17 (×3): 0.4 mg via ORAL
  Filled 2015-08-14 (×3): qty 1

## 2015-08-14 MED ORDER — PIPERACILLIN-TAZOBACTAM 3.375 G IVPB
3.3750 g | Freq: Three times a day (TID) | INTRAVENOUS | Status: DC
  Administered 2015-08-15 – 2015-08-17 (×6): 3.375 g via INTRAVENOUS
  Filled 2015-08-14 (×10): qty 50

## 2015-08-14 MED ORDER — ONDANSETRON HCL 4 MG PO TABS: 4.0000 mg | ORAL_TABLET | Freq: Four times a day (QID) | ORAL | Status: DC | PRN

## 2015-08-14 MED ORDER — SODIUM CHLORIDE 0.9 % IV BOLUS (SEPSIS)
1000.0000 mL | Freq: Once | INTRAVENOUS | Status: AC
  Administered 2015-08-14: 1000 mL via INTRAVENOUS

## 2015-08-14 MED ORDER — HEPARIN SODIUM (PORCINE) 5000 UNIT/ML IJ SOLN
5000.0000 [IU] | Freq: Three times a day (TID) | INTRAMUSCULAR | Status: DC
  Administered 2015-08-15 – 2015-08-17 (×6): 5000 [IU] via SUBCUTANEOUS
  Filled 2015-08-14 (×6): qty 1

## 2015-08-14 MED ORDER — SODIUM POLYSTYRENE SULFONATE 15 GM/60ML PO SUSP
15.0000 g | Freq: Once | ORAL | Status: AC
  Administered 2015-08-14: 15 g via ORAL
  Filled 2015-08-14: qty 60

## 2015-08-14 MED ORDER — ONDANSETRON HCL 4 MG/2ML IJ SOLN: 4.0000 mg | Freq: Four times a day (QID) | INTRAMUSCULAR | Status: DC | PRN

## 2015-08-14 MED ORDER — ACETAMINOPHEN 325 MG PO TABS
650.0000 mg | ORAL_TABLET | Freq: Four times a day (QID) | ORAL | Status: DC | PRN
  Administered 2015-08-15: 650 mg via ORAL
  Filled 2015-08-14: qty 2

## 2015-08-14 MED ORDER — SODIUM CHLORIDE 0.9 % IJ SOLN
3.0000 mL | Freq: Two times a day (BID) | INTRAMUSCULAR | Status: DC
  Administered 2015-08-14 – 2015-08-15 (×2): 3 mL via INTRAVENOUS

## 2015-08-14 MED ORDER — AMLODIPINE BESYLATE 10 MG PO TABS
10.0000 mg | ORAL_TABLET | Freq: Every day | ORAL | Status: DC
  Administered 2015-08-15 – 2015-08-17 (×3): 10 mg via ORAL
  Filled 2015-08-14 (×4): qty 1

## 2015-08-14 MED ORDER — POLYETHYLENE GLYCOL 3350 17 G PO PACK: 17.0000 g | PACK | Freq: Every day | ORAL | Status: DC | PRN

## 2015-08-14 MED ORDER — METOPROLOL TARTRATE 50 MG PO TABS
50.0000 mg | ORAL_TABLET | Freq: Two times a day (BID) | ORAL | Status: DC
  Administered 2015-08-15 – 2015-08-17 (×5): 50 mg via ORAL
  Filled 2015-08-14 (×6): qty 1

## 2015-08-14 MED ORDER — CITALOPRAM HYDROBROMIDE 20 MG PO TABS
20.0000 mg | ORAL_TABLET | Freq: Every day | ORAL | Status: DC
  Administered 2015-08-15 – 2015-08-17 (×3): 20 mg via ORAL
  Filled 2015-08-14 (×3): qty 1

## 2015-08-14 MED ORDER — DEXTROSE 5 % IV SOLN: 500.0000 mg | Freq: Once | INTRAVENOUS | Status: DC

## 2015-08-14 NOTE — ED Notes (Signed)
Pt sts AMS today and twitching; pt sts hx of same x 1 unknown cause; pt with swelling to left foot; pt on large amounts of pain medicine; pt noted to have low O2 sats and placed on 4L

## 2015-08-14 NOTE — Progress Notes (Signed)
ANTIBIOTIC CONSULT NOTE - INITIAL  Pharmacy Consult for Vancomycin and Zosyn Indication: rule out pneumonia  No Known Allergies  Patient Measurements: Weight: 192 lb 8 oz (87.317 kg) Adjusted Body Weight:   Vital Signs: Temp: 98.5 F (36.9 C) (11/29 1754) Temp Source: Oral (11/29 1754) BP: 112/67 mmHg (11/29 2145) Pulse Rate: 97 (11/29 2145) Intake/Output from previous day:   Intake/Output from this shift: Total I/O In: 2000 [I.V.:2000] Out: -   Labs:  Recent Labs  08/14/15 1906  WBC 13.1*  HGB 9.6*  PLT 210  CREATININE 2.26*   Estimated Creatinine Clearance: 32.7 mL/min (by C-G formula based on Cr of 2.26). No results for input(s): VANCOTROUGH, VANCOPEAK, VANCORANDOM, GENTTROUGH, GENTPEAK, GENTRANDOM, TOBRATROUGH, TOBRAPEAK, TOBRARND, AMIKACINPEAK, AMIKACINTROU, AMIKACIN in the last 72 hours.   Microbiology: No results found for this or any previous visit (from the past 720 hour(s)).  Medical History: Past Medical History  Diagnosis Date  . Hypertension   . Anxiety     takes Celexa  . Enlarged prostate   . Neuropathy ([place])   . Arthritis   . History of blood transfusion     Medications:   (Not in a hospital admission) Scheduled:  Infusions:   Assessment: 69yo male with history of HTN, neuropathy and arthritis presents with AMS. Pharmacy is consulted to dose vancomycin and zosyn for suspected pneumonia. Pt is afebrile, WBC 13.1, sCr 2.26.  Goal of Therapy:  Vancomycin trough level 15-20 mcg/ml  Plan:  Vancomycin 1250mg  IV q24h Zosyn 3.375g IV q8h Expected duration 7 days with resolution of temperature and/or normalization of WBC Measure antibiotic drug levels at steady state Follow up culture results, renal function and clinical course  Andrey Cota. Diona Foley, PharmD, [place] Clinical Pharmacist Pager [phone] 08/14/2015,10:05 PM

## 2015-08-14 NOTE — ED Notes (Signed)
Patient unable to produce sample for Urinalysis.

## 2015-08-14 NOTE — H&P (Signed)
[facility] History and Physical  William Jordan [id number] DOB: 03/25/1946 DOA: 08/14/2015  Referring physician: Gustavus Bryant, MD PCP: [name], MD   Chief Complaint: Altered Mental Status  HPI: William Jordan is a 69 y.o. male with prior history of HTN Anxiety neuropathy arthritis presents with altered mental status. He states that he has been having some cough going on for about a week. He also noted a loss of appetite. Patient was admitted about a month ago for a gout flare. Patient states that since then he had a cough. Patient states that he has been also bringin up sputum which is dark. No blood in it. He has been short of breath also His wife states that she has noted he was more confused. Now she states he seems to be more clear. Patient had no headaches no dizziness noted. He has not had any syncope. He has had no chest pain noted. He has no edema noted. He has no nausea or vomiting. He states that he does have chronic neuropathy not from diabetes mellitus.   Review of Systems:  Complete 12 point ROS performed and is unremarkable.   Past Medical History  Diagnosis Date  . Hypertension   . Anxiety     takes Celexa  . Enlarged prostate   . Neuropathy ([place])   . Arthritis   . History of blood transfusion    Past Surgical History  Procedure Laterality Date  . Joint replacement Left     knee, x 2  . Joint replacement Right     hip  . Joint replacement Left     partial shoulder  . Elbow surgery Right   . Hernia repair      umbilical  . Tonsillectomy    . Vasectomy    . Colonoscopy      one polyp removed - benign  . Spinal cord stimulator insertion N/A 09/28/2014    Procedure: LUMBAR SPINAL CORD STIMULATOR INSERTION;  Surgeon: Melina Schools, MD;  Location: [facility];  Service: Orthopedics;  Laterality: N/A;   Social History:  reports that he has been smoking E-cigarettes.  He has quit using smokeless tobacco. His smokeless tobacco use included Snuff. He reports that he  does not drink alcohol or use illicit drugs.  No Known Allergies  Family History  Problem Relation Age of Onset  . Heart disease Father      Prior to Admission medications   Medication Sig Start Date End Date Taking? Authorizing Provider  amLODipine (NORVASC) 10 MG tablet Take 1 tablet (10 mg total) by mouth daily. 06/12/15  Yes Eugenie Filler, MD  citalopram (CELEXA) 20 MG tablet Take 20 mg by mouth daily.   Yes Historical Provider, MD  colchicine (COLCRYS) 0.6 MG tablet Take 1 tablet (0.6 mg total) by mouth 2 (two) times daily. Patient taking differently: Take 0.6 mg by mouth 2 (two) times daily as needed (gout).  06/12/15  Yes Eugenie Filler, MD  docusate sodium (COLACE) 100 MG capsule Take 1 capsule (100 mg total) by mouth 3 (three) times daily as needed for mild constipation. 09/28/14  Yes Melina Schools, MD  gabapentin (NEURONTIN) 600 MG tablet Take 600 mg by mouth 5 (five) times daily.   Yes Historical Provider, MD  metoprolol (LOPRESSOR) 50 MG tablet Take 1 tablet (50 mg total) by mouth 2 (two) times daily. 06/12/15  Yes Eugenie Filler, MD  morphine (MS CONTIN) 60 MG 12 hr tablet Take 60 mg by mouth every 8 (  eight) hours.   Yes Historical Provider, MD  polyethylene glycol (MIRALAX / GLYCOLAX) packet Take 17 g by mouth daily. Patient taking differently: Take 17 g by mouth daily as needed for mild constipation or moderate constipation.  06/12/15  Yes Eugenie Filler, MD  tamsulosin (FLOMAX) 0.4 MG CAPS capsule Take 0.4 mg by mouth daily.   Yes Historical Provider, MD  gabapentin (NEURONTIN) 300 MG capsule Take 1 capsule (300 mg total) by mouth 3 (three) times daily before meals. Patient not taking: Reported on 08/14/2015 06/12/15   Eugenie Filler, MD  gabapentin (NEURONTIN) 300 MG capsule Take 2 capsules (600 mg total) by mouth at bedtime. Patient not taking: Reported on 08/14/2015 06/12/15   Eugenie Filler, MD  predniSONE (DELTASONE) 20 MG tablet Take 1 tablet (20 mg total)  by mouth daily before breakfast. Patient not taking: Reported on 08/14/2015 06/23/15   Nita Sells, MD  predniSONE (DELTASONE) 20 MG tablet Take 2 tablets (40 mg total) by mouth daily before breakfast. Patient not taking: Reported on 08/14/2015 06/18/15   Nita Sells, MD  senna-docusate (SENOKOT-S) 8.6-50 MG tablet Take 1 tablet by mouth at bedtime. Patient not taking: Reported on 08/14/2015 06/12/15   Eugenie Filler, MD   Physical Exam: Filed Vitals:   08/14/15 1754 08/14/15 1845 08/14/15 2000 08/14/15 2030  BP: 122/63 125/103 121/68 90/75  Pulse: 110 105 109 102  Temp: 98.5 F (36.9 C)     TempSrc: Oral     Resp: 18 20 19  40  Weight: 87.317 kg (192 lb 8 oz)     SpO2: 94% 95% 98% 88%    Wt Readings from Last 3 Encounters:  08/14/15 87.317 kg (192 lb 8 oz)  06/15/15 82.509 kg (181 lb 14.4 oz)  06/09/15 82.5 kg (181 lb 14.1 oz)    General:  Appears calm and comfortable Eyes: PERRL, normal lids, irises & conjunctiva ENT: grossly normal hearing, lips & tongue Neck: no LAD, masses or thyromegaly Cardiovascular: RRR, no m/r/g. No LE edema. Respiratory: CTA bilaterally, no w/r/r. Normal respiratory effort. Abdomen: soft, ntnd Skin: no rash or induration seen on limited exam Musculoskeletal: grossly normal tone BUE/BLE Psychiatric: grossly normal mood and affect Neurologic: grossly non-focal.          Labs on Admission:  Basic Metabolic Panel:  Recent Labs Lab 08/14/15 1906  NA 131*  K 5.6*  CL 91*  CO2 27  GLUCOSE 102*  BUN 47*  CREATININE 2.26*  CALCIUM 8.8*   Liver Function Tests:  Recent Labs Lab 08/14/15 1906  AST 42*  ALT 19  ALKPHOS 65  BILITOT 0.6  PROT 6.1*  ALBUMIN 3.4*   No results for input(s): LIPASE, AMYLASE in the last 168 hours. No results for input(s): AMMONIA in the last 168 hours. CBC:  Recent Labs Lab 08/14/15 1906  WBC 13.1*  NEUTROABS 10.7*  HGB 9.6*  HCT 29.8*  MCV 86.6  PLT 210   Cardiac Enzymes: No  results for input(s): CKTOTAL, CKMB, CKMBINDEX, TROPONINI in the last 168 hours.  BNP (last 3 results) No results for input(s): BNP in the last 8760 hours.  ProBNP (last 3 results) No results for input(s): PROBNP in the last 8760 hours.  CBG: No results for input(s): GLUCAP in the last 168 hours.  Radiological Exams on Admission: Dg Chest 2 View  08/14/2015  CLINICAL DATA:  69 year old male with shortness of breath x1 day EXAM: CHEST  2 VIEW COMPARISON:  Radiograph dated 06/15/2015 FINDINGS: Single-view of  the chest demonstrate minimal increased vascular and interstitial markings which may represent mild edema. An area of minimally increased opacity is noted in the right mid lung field which may be related to subsegmental atelectatic changes. Developing pneumonia is not excluded. There is no focal consolidation, pleural effusion, or pneumothorax. The left costophrenic angle is excluded from the image. The cardiac silhouette appears similar to the prior study. There is osteopenia with multilevel degenerative changes of the spine. Multilevel mid and lower thoracic as well as upper vertebral compression fractures again noted. A spinal stimulator is is seen. There is a left shoulder arthroplasty. Old healed right clavicular fracture. No acute fracture identified. IMPRESSION: No focal consolidation. Right mid lung field focal subsegmental atelectasis versus less likely developing pneumonia. Clinical correlation and follow-up recommended. Electronically Signed   By: Anner Crete M.D.   On: 08/14/2015 20:12      Assessment/Plan Principal Problem:   Altered mental status Active Problems:   Hyperkalemia   HCAP (healthcare-associated pneumonia)   Hypertension   AKI (acute kidney injury) ([place])   Anemia   1. Altered Mental Status -due to underlying metabolic derangement and pneumonia now clearing up -will monitor overnight -will check lactate and cultures  2. Hyperkalemia -kayexalate  now -repeat labs tonight  3. HCAP -recent hospital admission -will start on Vanc and zosyn empirically with pharmacy to dose adjust for renal function  4. HTN -cotninue with norvasc -continue with lopressor  5. AKI -likely dehydrated -start on IVF -repeat labs -hold colchicine  6. Anemia -will check iron studies -monitor labs  7. Depression -will continue with celexa     Code Status: full code DVT Prophylaxis:heparin Family Communication: none  Disposition Plan: home    [facility] Pager [phone]

## 2015-08-14 NOTE — ED Notes (Signed)
Dr. Kathi Simpers stated he turned patient's O2 from 4L down to 2L and pts O2 decreased to 87%. Pt placed back on 4L [place].

## 2015-08-14 NOTE — ED Provider Notes (Signed)
CSN: [id number]     Arrival date & time 08/14/15  1717 History   First MD Initiated Contact with Patient 08/14/15 1817     Chief Complaint  Patient presents with  . Altered Mental Status    Patient is a 69 y.o. male presenting with altered mental status. The history is provided by the EMS personnel, a relative and the patient.  Altered Mental Status Presenting symptoms: behavior changes and confusion   Severity:  Moderate Most recent episode:  Today Episode history:  Multiple Duration:  1 day Timing:  Constant Progression:  Worsening Chronicity:  New Context: recent change in medication   Associated symptoms: agitation   Associated symptoms: no abdominal pain, no fever, no headaches, no nausea, no rash and no vomiting     Past Medical History  Diagnosis Date  . Hypertension   . Anxiety     takes Celexa  . Enlarged prostate   . Neuropathy ([place])   . Arthritis   . History of blood transfusion    Past Surgical History  Procedure Laterality Date  . Joint replacement Left     knee, x 2  . Joint replacement Right     hip  . Joint replacement Left     partial shoulder  . Elbow surgery Right   . Hernia repair      umbilical  . Tonsillectomy    . Vasectomy    . Colonoscopy      one polyp removed - benign  . Spinal cord stimulator insertion N/A 09/28/2014    Procedure: LUMBAR SPINAL CORD STIMULATOR INSERTION;  Surgeon: Melina Schools, MD;  Location: [facility];  Service: Orthopedics;  Laterality: N/A;   Family History  Problem Relation Age of Onset  . Heart disease Father    Social History  Substance Use Topics  . Smoking status: Current Every Day Smoker    Types: E-cigarettes  . Smokeless tobacco: Former Systems developer    Types: Snuff     Comment: does not use any tobacco products any more  . Alcohol Use: No    Review of Systems  Constitutional: Negative for fever and chills.  HENT: Negative for rhinorrhea and sore throat.   Eyes: Negative for visual disturbance.   Respiratory: Negative for cough and shortness of breath.   Cardiovascular: Negative for chest pain.  Gastrointestinal: Negative for nausea, vomiting, abdominal pain, diarrhea and constipation.  Genitourinary: Negative for dysuria and hematuria.  Musculoskeletal: Negative for back pain and neck pain.  Skin: Negative for rash.  Neurological: Negative for syncope and headaches.  Psychiatric/Behavioral: Positive for confusion and agitation. The patient is hyperactive.        Delusions  All other systems reviewed and are negative.  Allergies  Review of patient's allergies indicates no known allergies.  Home Medications   Prior to Admission medications   Medication Sig Start Date End Date Taking? Authorizing Provider  amLODipine (NORVASC) 10 MG tablet Take 1 tablet (10 mg total) by mouth daily. 06/12/15  Yes Eugenie Filler, MD  citalopram (CELEXA) 20 MG tablet Take 20 mg by mouth daily.   Yes Historical Provider, MD  colchicine (COLCRYS) 0.6 MG tablet Take 1 tablet (0.6 mg total) by mouth 2 (two) times daily. Patient taking differently: Take 0.6 mg by mouth 2 (two) times daily as needed (gout).  06/12/15  Yes Eugenie Filler, MD  docusate sodium (COLACE) 100 MG capsule Take 1 capsule (100 mg total) by mouth 3 (three) times daily as needed for mild  constipation. 09/28/14  Yes Melina Schools, MD  gabapentin (NEURONTIN) 600 MG tablet Take 600 mg by mouth 5 (five) times daily.   Yes Historical Provider, MD  metoprolol (LOPRESSOR) 50 MG tablet Take 1 tablet (50 mg total) by mouth 2 (two) times daily. 06/12/15  Yes Eugenie Filler, MD  morphine (MS CONTIN) 60 MG 12 hr tablet Take 60 mg by mouth every 8 (eight) hours.   Yes Historical Provider, MD  polyethylene glycol (MIRALAX / GLYCOLAX) packet Take 17 g by mouth daily. Patient taking differently: Take 17 g by mouth daily as needed for mild constipation or moderate constipation.  06/12/15  Yes Eugenie Filler, MD  tamsulosin (FLOMAX) 0.4 MG CAPS  capsule Take 0.4 mg by mouth daily.   Yes Historical Provider, MD  gabapentin (NEURONTIN) 300 MG capsule Take 1 capsule (300 mg total) by mouth 3 (three) times daily before meals. Patient not taking: Reported on 08/14/2015 06/12/15   Eugenie Filler, MD  gabapentin (NEURONTIN) 300 MG capsule Take 2 capsules (600 mg total) by mouth at bedtime. Patient not taking: Reported on 08/14/2015 06/12/15   Eugenie Filler, MD  predniSONE (DELTASONE) 20 MG tablet Take 1 tablet (20 mg total) by mouth daily before breakfast. Patient not taking: Reported on 08/14/2015 06/23/15   Nita Sells, MD  predniSONE (DELTASONE) 20 MG tablet Take 2 tablets (40 mg total) by mouth daily before breakfast. Patient not taking: Reported on 08/14/2015 06/18/15   Nita Sells, MD  senna-docusate (SENOKOT-S) 8.6-50 MG tablet Take 1 tablet by mouth at bedtime. Patient not taking: Reported on 08/14/2015 06/12/15   Eugenie Filler, MD   BP 112/67 mmHg  Pulse 97  Temp(Src) 98.5 F (36.9 C) (Oral)  Resp 16  Wt 87.317 kg  SpO2 92% Physical Exam  Constitutional: He appears well-developed and well-nourished. No distress.  HENT:  Head: Normocephalic and atraumatic.  Mouth/Throat: Oropharynx is clear and moist.  Eyes: EOM are normal.  Neck: Neck supple. No JVD present.  Cardiovascular: Regular rhythm, normal heart sounds and intact distal pulses.  Tachycardia present.   Pulmonary/Chest: Effort normal. He has rales in the right middle field and the right lower field.  Abdominal: Soft. He exhibits no distension. There is no tenderness.  Musculoskeletal: Normal range of motion. He exhibits no edema.  Neurological: He is alert. He has normal strength. He is disoriented. No cranial nerve deficit or sensory deficit. GCS eye subscore is 4. GCS verbal subscore is 5. GCS motor subscore is 6.  Reflex Scores:      Bicep reflexes are 2+ on the right side and 2+ on the left side.      Brachioradialis reflexes are 2+ on the  right side and 2+ on the left side.      Patellar reflexes are 2+ on the right side and 2+ on the left side. Normal finger-nose exam  Skin: Skin is warm and dry.  Psychiatric: His mood appears anxious. His speech is rapid and/or pressured. He is hyperactive. Thought content is not paranoid. He expresses no homicidal and no suicidal ideation.    ED Course  Procedures  None   Labs Review Labs Reviewed  CBC WITH DIFFERENTIAL/PLATELET - Abnormal; Notable for the following:    WBC 13.1 (*)    RBC 3.44 (*)    Hemoglobin 9.6 (*)    HCT 29.8 (*)    Neutro Abs 10.7 (*)    All other components within normal limits  COMPREHENSIVE METABOLIC PANEL - Abnormal;  Notable for the following:    Sodium 131 (*)    Potassium 5.6 (*)    Chloride 91 (*)    Glucose, Bld 102 (*)    BUN 47 (*)    Creatinine, Ser 2.26 (*)    Calcium 8.8 (*)    Total Protein 6.1 (*)    Albumin 3.4 (*)    AST 42 (*)    GFR calc non Af Amer 28 (*)    GFR calc Af Amer 33 (*)    All other components within normal limits  URINALYSIS, ROUTINE W REFLEX MICROSCOPIC (NOT AT [facility]) - Abnormal; Notable for the following:    Ketones, ur 15 (*)    All other components within normal limits  URINE RAPID DRUG SCREEN, HOSP PERFORMED - Abnormal; Notable for the following:    Opiates POSITIVE (*)    All other components within normal limits  I-STAT CG4 LACTIC ACID, ED  I-STAT CG4 LACTIC ACID, ED    Imaging Review Dg Chest 2 View  08/14/2015  CLINICAL DATA:  69 year old male with shortness of breath x1 day EXAM: CHEST  2 VIEW COMPARISON:  Radiograph dated 06/15/2015 FINDINGS: Single-view of the chest demonstrate minimal increased vascular and interstitial markings which may represent mild edema. An area of minimally increased opacity is noted in the right mid lung field which may be related to subsegmental atelectatic changes. Developing pneumonia is not excluded. There is no focal consolidation, pleural effusion, or pneumothorax. The  left costophrenic angle is excluded from the image. The cardiac silhouette appears similar to the prior study. There is osteopenia with multilevel degenerative changes of the spine. Multilevel mid and lower thoracic as well as upper vertebral compression fractures again noted. A spinal stimulator is is seen. There is a left shoulder arthroplasty. Old healed right clavicular fracture. No acute fracture identified. IMPRESSION: No focal consolidation. Right mid lung field focal subsegmental atelectasis versus less likely developing pneumonia. Clinical correlation and follow-up recommended. Electronically Signed   By: Anner Crete M.D.   On: 08/14/2015 20:12   I have personally reviewed and evaluated these images and lab results as part of my medical decision-making.  MDM   Final diagnoses:  Metabolic encephalopathy  Acute renal failure, unspecified acute renal failure type (HCC)  RUQ pain   69 yo M with a PMH of HTN, anxiety, BPH and pseudogout who presents with cough and altered mental status. Wife reports last normal was yesterday. Today he has been "fidgety," confused/disoriented and having delusions that someone is breaking into the house. Patient knows year but not month, know current president and name and DOB. Wet sounding cough on exam. Patient initially placed on 4L O2 in triage. Turned down to 2L and O2 sat was 87%. Placed back on 4L. Feel this could be delirium from PNA. Will get CXR, labs, urine studies. Patient tachycardic, normotensive.   I independently reviewed and interpreted available labs and imaging studies. Hyperkalemic K 5.6. Acute renal failure, creat 2.2 from 0.7 one month ago.   Acute renal failure, hypoxia requiring 4L O2 [place], leukocytosis, hyperkalemia without concerning EKG changes. vanc and zosyn given for HCAP. Will admit for PNA and ARF.   Discussed with Dr. Ashok Cordia.  Gustavus Bryant, MD 08/15/15 [id number]  Lajean Saver, MD 08/15/15 [phone]

## 2015-08-14 NOTE — ED Notes (Signed)
Admitting at bedside 

## 2015-08-15 DIAGNOSIS — J189 Pneumonia, unspecified organism: Principal | ICD-10-CM

## 2015-08-15 LAB — IRON AND TIBC
Iron: 34 ug/dL — ABNORMAL LOW (ref 45–182)
Saturation Ratios: 13 % — ABNORMAL LOW (ref 17.9–39.5)
TIBC: 265 ug/dL (ref 250–450)
UIBC: 231 ug/dL

## 2015-08-15 LAB — COMPREHENSIVE METABOLIC PANEL
ALBUMIN: 3.2 g/dL — AB (ref 3.5–5.0)
ALK PHOS: 57 U/L (ref 38–126)
ALT: 18 U/L (ref 17–63)
ALT: 19 U/L (ref 17–63)
AST: 42 U/L — ABNORMAL HIGH (ref 15–41)
AST: 41 U/L (ref 15–41)
Albumin: 3.4 g/dL — ABNORMAL LOW (ref 3.5–5.0)
Alkaline Phosphatase: 59 U/L (ref 38–126)
Anion gap: 10 (ref 5–15)
Anion gap: 10 (ref 5–15)
BILIRUBIN TOTAL: 0.8 mg/dL (ref 0.3–1.2)
BILIRUBIN TOTAL: 0.9 mg/dL (ref 0.3–1.2)
BUN: 37 mg/dL — ABNORMAL HIGH (ref 6–20)
BUN: 39 mg/dL — AB (ref 6–20)
CALCIUM: 8.4 mg/dL — AB (ref 8.9–10.3)
CHLORIDE: 98 mmol/L — AB (ref 101–111)
CO2: 26 mmol/L (ref 22–32)
CO2: 25 mmol/L (ref 22–32)
CREATININE: 1.31 mg/dL — AB (ref 0.61–1.24)
Calcium: 8.2 mg/dL — ABNORMAL LOW (ref 8.9–10.3)
Chloride: 97 mmol/L — ABNORMAL LOW (ref 101–111)
Creatinine, Ser: 1.59 mg/dL — ABNORMAL HIGH (ref 0.61–1.24)
GFR calc Af Amer: 50 mL/min — ABNORMAL LOW (ref >60)
GFR calc non Af Amer: 43 mL/min — ABNORMAL LOW (ref >60)
GFR, EST NON AFRICAN AMERICAN: 55 mL/min — AB (ref >60)
GLUCOSE: 104 mg/dL — AB (ref 65–99)
Glucose, Bld: 101 mg/dL — ABNORMAL HIGH (ref 65–99)
POTASSIUM: 4.5 mmol/L (ref 3.5–5.1)
Potassium: 4.4 mmol/L (ref 3.5–5.1)
Sodium: 133 mmol/L — ABNORMAL LOW (ref 135–145)
Sodium: 133 mmol/L — ABNORMAL LOW (ref 135–145)
TOTAL PROTEIN: 6 g/dL — AB (ref 6.5–8.1)
TOTAL PROTEIN: 6.2 g/dL — AB (ref 6.5–8.1)

## 2015-08-15 LAB — CBC WITH DIFFERENTIAL/PLATELET
BASOS ABS: 0 10*3/uL (ref 0.0–0.1)
BASOS PCT: 0 %
Eosinophils Absolute: 0.1 10*3/uL (ref 0.0–0.7)
Eosinophils Relative: 1 %
HEMATOCRIT: 28.4 % — AB (ref 39.0–52.0)
HEMOGLOBIN: 9.2 g/dL — AB (ref 13.0–17.0)
Lymphocytes Relative: 14 %
Lymphs Abs: 1.3 10*3/uL (ref 0.7–4.0)
MCH: 27.7 pg (ref 26.0–34.0)
MCHC: 32.4 g/dL (ref 30.0–36.0)
MCV: 85.5 fL (ref 78.0–100.0)
Monocytes Absolute: 1.1 10*3/uL — ABNORMAL HIGH (ref 0.1–1.0)
Monocytes Relative: 11 %
NEUTROS ABS: 6.9 10*3/uL (ref 1.7–7.7)
NEUTROS PCT: 74 %
Platelets: 183 10*3/uL (ref 150–400)
RBC: 3.32 MIL/uL — ABNORMAL LOW (ref 4.22–5.81)
RDW: 15 % (ref 11.5–15.5)
WBC: 9.3 10*3/uL (ref 4.0–10.5)

## 2015-08-15 LAB — FERRITIN: FERRITIN: 122 ng/mL (ref 24–336)

## 2015-08-15 LAB — LACTIC ACID, PLASMA
Lactic Acid, Venous: 1.1 mmol/L (ref 0.5–2.0)
Lactic Acid, Venous: 1.3 mmol/L (ref 0.5–2.0)

## 2015-08-15 LAB — CBC
HCT: 29.8 % — ABNORMAL LOW (ref 39.0–52.0)
Hemoglobin: 9.7 g/dL — ABNORMAL LOW (ref 13.0–17.0)
MCH: 27.9 pg (ref 26.0–34.0)
MCHC: 32.6 g/dL (ref 30.0–36.0)
MCV: 85.6 fL (ref 78.0–100.0)
PLATELETS: 180 10*3/uL (ref 150–400)
RBC: 3.48 MIL/uL — AB (ref 4.22–5.81)
RDW: 15 % (ref 11.5–15.5)
WBC: 9.3 10*3/uL (ref 4.0–10.5)

## 2015-08-15 LAB — PROTIME-INR
INR: 1.2 (ref 0.00–1.49)
INR: 1.23 (ref 0.00–1.49)
PROTHROMBIN TIME: 15.4 s — AB (ref 11.6–15.2)
PROTHROMBIN TIME: 15.7 s — AB (ref 11.6–15.2)

## 2015-08-15 LAB — APTT
APTT: 35 s (ref 24–37)
aPTT: 35 seconds (ref 24–37)

## 2015-08-15 LAB — PROCALCITONIN: Procalcitonin: 2.16 ng/mL

## 2015-08-15 LAB — GLUCOSE, CAPILLARY: Glucose-Capillary: 91 mg/dL (ref 65–99)

## 2015-08-15 LAB — VITAMIN B12: VITAMIN B 12: 232 pg/mL (ref 180–914)

## 2015-08-15 LAB — TSH: TSH: 1.074 u[IU]/mL (ref 0.350–4.500)

## 2015-08-15 MED ORDER — VANCOMYCIN HCL IN DEXTROSE 1-5 GM/200ML-% IV SOLN
1000.0000 mg | Freq: Two times a day (BID) | INTRAVENOUS | Status: DC
  Administered 2015-08-15 – 2015-08-17 (×4): 1000 mg via INTRAVENOUS
  Filled 2015-08-15 (×6): qty 200

## 2015-08-15 MED ORDER — TRAZODONE HCL 50 MG PO TABS
50.0000 mg | ORAL_TABLET | Freq: Every day | ORAL | Status: DC
  Administered 2015-08-15 – 2015-08-16 (×2): 50 mg via ORAL
  Filled 2015-08-15 (×2): qty 1

## 2015-08-15 NOTE — Progress Notes (Signed)
New Admission Note:   Arrival Method: stretcher Mental Orientation: alert and oriented x2 Telemetry:15 Assessment: Completed Skin: IV:r ac Pain:denies Tubes:none Safety Measures: Safety Fall Prevention Plan has been given Admission:confusion  [place] Orientation: Patient has been orientated to the room, unit and staff.  Family:  Orders have been reviewed and implemented. Will continue to monitor the patient. Call light has been placed within reach and bed alarm has been activated.   Amado Coe, RN Phone number: [phone]

## 2015-08-15 NOTE — Progress Notes (Signed)
ANTIBIOTIC CONSULT NOTE - FOLLOW UP  Pharmacy Consult for Vancomycin and Zosyn Indication: pneumonia  No Known Allergies  Patient Measurements: Height: 5\' 10"  (177.8 cm) Weight: 195 lb 1.7 oz (88.5 kg) IBW/kg (Calculated) : 73  Vital Signs: Temp: 98.4 F (36.9 C) (11/30 0945) Temp Source: Oral (11/30 0945) BP: 168/85 mmHg (11/30 0945) Pulse Rate: 96 (11/30 0945) Labs:  Recent Labs  08/14/15 1906 08/15/15 0016 08/15/15 0218  WBC 13.1* 9.3 9.3  HGB 9.6* 9.2* 9.7*  PLT 210 183 180  CREATININE 2.26* 1.59* 1.31*   Estimated Creatinine Clearance: 61.3 mL/min (by C-G formula based on Cr of 1.31).  Assessment:   Day # 2 Vanc and Zosyn. Received Vanc 1250 mg IV x 1 last night, with plans to continue q24hrs. Creatinine 2.26 ->1.31 after hydration. Off Colchicine and Ibuprofen.   Tmax 99.1, WBC 9.3  Blood cultures sent.    Goal of Therapy:  Vancomycin trough level 15-20 mcg/ml appropriate Zosyn dose for renal function and infection  Plan:   Modify Vancomycin to 1gm IV q12hrs.  Continue Zosyn 3.375 gm IV q8hrs (each over 4 hours)  Follow renal function, culture data, progress.  May transition to [place] if cultures remain negative.  If iron added, separate doses from Levaquin by at least 2 hours to avoid potential interaction.  Arty Baumgartner, [place] Pager: [phone] 08/15/2015,2:50 PM

## 2015-08-15 NOTE — Progress Notes (Signed)
Pt refuses to keep cardiac monitor in place

## 2015-08-15 NOTE — Progress Notes (Signed)
PROGRESS NOTE  William Jordan [id number] DOB: December 24, 1945 DOA: 08/14/2015 PCP: [name], MD   HPI: 69 y.o. male with prior history of HTN Anxiety neuropathy arthritis presents with altered mental status. He states that he has been having some cough going on for about a week  Subjective / 24 H Interval events - no complaints today, feels back to normal, asking about going home  Assessment/Plan: Principal Problem:   Altered mental status Active Problems:   Hyperkalemia   HCAP (healthcare-associated pneumonia)   Hypertension   AKI (acute kidney injury) ([place])   Anemia   Altered mental state  Altered Mental Status - due to underlying metabolic derangement and pneumonia now clearing up, as well as renal failure with potential accumulation of home medications - improving  Hyperkalemia - s/p kayexalate - resolved this morning  HCAP - recent hospital admission - continue Vanc and zosyn empirically with pharmacy to dose adjust for renal function, if cultures remain negative transition to Levaquin  HTN - cotninue with norvasc - continue with lopressor  AKI - likely dehydrated; also has been using Ibuprofen 800 mg and Colchicine - improved with fluids  Anemia - iron deficient, start iron supplementation  Depression - will continue with celexa   Diet: Diet heart healthy/carb modified Room service appropriate?: Yes; Fluid consistency:: Thin Fluids: NS DVT Prophylaxis: heparin  Code Status: Full Code Family Communication: d/w wife bedside  Disposition Plan: home when ready, hopefully 1 day  Barriers to discharge: IV antibiotics, renal failure  Consultants:  None   Procedures:  None    Antibiotics  Anti-infectives    Start     Dose/Rate Route Frequency Ordered Stop   08/15/15 0600  piperacillin-tazobactam (ZOSYN) IVPB 3.375 g     3.375 g 12.5 mL/hr over 240 Minutes Intravenous Every 8 hours 08/14/15 2316     08/14/15 2230  vancomycin (VANCOCIN)  1,250 mg in sodium chloride 0.9 % 250 mL IVPB     1,250 mg 166.7 mL/hr over 90 Minutes Intravenous Every 24 hours 08/14/15 2208     08/14/15 2045  piperacillin-tazobactam (ZOSYN) IVPB 3.375 g     3.375 g 100 mL/hr over 30 Minutes Intravenous  Once 08/14/15 2031 08/14/15 2113   08/14/15 2030  cefTRIAXone (ROCEPHIN) 1 g in dextrose 5 % 50 mL IVPB  Status:  Discontinued     1 g 100 mL/hr over 30 Minutes Intravenous  Once 08/14/15 2026 08/14/15 2029   08/14/15 2030  azithromycin (ZITHROMAX) 500 mg in dextrose 5 % 250 mL IVPB  Status:  Discontinued     500 mg 250 mL/hr over 60 Minutes Intravenous  Once 08/14/15 2026 08/14/15 2031   08/14/15 2030  piperacillin-tazobactam (ZOSYN) IVPB 2.25 g  Status:  Discontinued     2.25 g 100 mL/hr over 30 Minutes Intravenous  Once 08/14/15 2029 08/14/15 2031       Studies  Dg Chest 2 View  08/14/2015  CLINICAL DATA:  69 year old male with shortness of breath x1 day EXAM: CHEST  2 VIEW COMPARISON:  Radiograph dated 06/15/2015 FINDINGS: Single-view of the chest demonstrate minimal increased vascular and interstitial markings which may represent mild edema. An area of minimally increased opacity is noted in the right mid lung field which may be related to subsegmental atelectatic changes. Developing pneumonia is not excluded. There is no focal consolidation, pleural effusion, or pneumothorax. The left costophrenic angle is excluded from the image. The cardiac silhouette appears similar to the prior study. There is osteopenia with multilevel  degenerative changes of the spine. Multilevel mid and lower thoracic as well as upper vertebral compression fractures again noted. A spinal stimulator is is seen. There is a left shoulder arthroplasty. Old healed right clavicular fracture. No acute fracture identified. IMPRESSION: No focal consolidation. Right mid lung field focal subsegmental atelectasis versus less likely developing pneumonia. Clinical correlation and follow-up  recommended. Electronically Signed   By: Anner Crete M.D.   On: 08/14/2015 20:12    Objective  Filed Vitals:   08/14/15 2145 08/14/15 2337 08/15/15 0603 08/15/15 0945  BP: 112/67 99/53 155/73 168/85  Pulse: 97 105 87 96  Temp:  99.1 F (37.3 C) 98.7 F (37.1 C) 98.4 F (36.9 C)  TempSrc:  Oral Oral Oral  Resp: 16 17 16 18   Height:  5\' 10"  (1.778 m)    Weight:  88.5 kg (195 lb 1.7 oz)    SpO2: 92% 94% 91% 99%    Intake/Output Summary (Last 24 hours) at 08/15/15 1309 Last data filed at 08/15/15 1100  Gross per 24 hour  Intake   2120 ml  Output      2 ml  Net   2118 ml   Filed Weights   08/14/15 1754 08/14/15 2337  Weight: 87.317 kg (192 lb 8 oz) 88.5 kg (195 lb 1.7 oz)    Exam:  GENERAL: NAD, anxious  HEENT: no scleral icterus, PERRL  NECK: supple, no LAD  LUNGS: CTA biL, no wheezing  HEART: RRR without MRG  ABDOMEN: soft, non tender  EXTREMITIES: no clubbing/cyanosis  NEUROLOGIC: non focal  PSYCHIATRIC: anxious  Data Reviewed: Basic Metabolic Panel:  Recent Labs Lab 08/14/15 1906 08/15/15 0016 08/15/15 0218  NA 131* 133* 133*  K 5.6* 4.5 4.4  CL 91* 97* 98*  CO2 27 26 25   GLUCOSE 102* 104* 101*  BUN 47* 39* 37*  CREATININE 2.26* 1.59* 1.31*  CALCIUM 8.8* 8.2* 8.4*   Liver Function Tests:  Recent Labs Lab 08/14/15 1906 08/15/15 0016 08/15/15 0218  AST 42* 41 42*  ALT 19 18 19   ALKPHOS 65 57 59  BILITOT 0.6 0.8 0.9  PROT 6.1* 6.0* 6.2*  ALBUMIN 3.4* 3.2* 3.4*   CBC:  Recent Labs Lab 08/14/15 1906 08/15/15 0016 08/15/15 0218  WBC 13.1* 9.3 9.3  NEUTROABS 10.7* 6.9  --   HGB 9.6* 9.2* 9.7*  HCT 29.8* 28.4* 29.8*  MCV 86.6 85.5 85.6  PLT 210 183 180   CBG:  Recent Labs Lab 08/14/15 2302 08/15/15 0742  GLUCAP 115* 91    Scheduled Meds: . amLODipine  10 mg Oral Daily  . citalopram  20 mg Oral Daily  . folic acid  1 mg Oral Daily  . gabapentin  600 mg Oral 5 X Daily  . heparin  5,000 Units Subcutaneous 3 times  per day  . metoprolol  50 mg Oral BID  . morphine  60 mg Oral 3 times per day  . multivitamin with minerals  1 tablet Oral Daily  . piperacillin-tazobactam (ZOSYN)  IV  3.375 g Intravenous Q8H  . sodium chloride  3 mL Intravenous Q12H  . tamsulosin  0.4 mg Oral Daily  . thiamine  100 mg Oral Daily  . vancomycin  1,250 mg Intravenous Q24H   Continuous Infusions: . sodium chloride 1,000 mL (08/14/15 2352)    Marzetta Board, MD [facility] Pager [phone]. If 7 PM - 7 AM, please contact night-coverage at [email or web address], password [facility] 08/15/2015, 1:09 PM  LOS: 1 day

## 2015-08-15 NOTE — Progress Notes (Signed)
Pt persistent with exiting bed, leaving room to hallway. Disorientation except to self mostly. Spoke to wife per phone and she has offered to come in at this time. Spoke with MD regarding pt. No hostility noted. No risk for self harm observed.

## 2015-08-16 ENCOUNTER — Inpatient Hospital Stay (HOSPITAL_COMMUNITY): Payer: Medicare Other

## 2015-08-16 ENCOUNTER — Encounter (HOSPITAL_COMMUNITY): Payer: Self-pay | Admitting: *Deleted

## 2015-08-16 DIAGNOSIS — R4182 Altered mental status, unspecified: Secondary | ICD-10-CM

## 2015-08-16 DIAGNOSIS — R400 Somnolence: Secondary | ICD-10-CM

## 2015-08-16 DIAGNOSIS — G9340 Encephalopathy, unspecified: Secondary | ICD-10-CM

## 2015-08-16 LAB — BASIC METABOLIC PANEL
ANION GAP: 10 (ref 5–15)
BUN: 10 mg/dL (ref 6–20)
CALCIUM: 8.8 mg/dL — AB (ref 8.9–10.3)
CO2: 29 mmol/L (ref 22–32)
CREATININE: 0.79 mg/dL (ref 0.61–1.24)
Chloride: 95 mmol/L — ABNORMAL LOW (ref 101–111)
Glucose, Bld: 109 mg/dL — ABNORMAL HIGH (ref 65–99)
Potassium: 3.5 mmol/L (ref 3.5–5.1)
SODIUM: 134 mmol/L — AB (ref 135–145)

## 2015-08-16 LAB — FOLATE RBC
FOLATE, HEMOLYSATE: 421.6 ng/mL
Folate, RBC: 1506 ng/mL (ref >498)
Hematocrit: 28 % — ABNORMAL LOW (ref 37.5–51.0)

## 2015-08-16 LAB — HEMOGLOBIN A1C
HEMOGLOBIN A1C: 6.1 % — AB (ref 4.8–5.6)
MEAN PLASMA GLUCOSE: 128 mg/dL

## 2015-08-16 LAB — GLUCOSE, CAPILLARY: GLUCOSE-CAPILLARY: 95 mg/dL (ref 65–99)

## 2015-08-16 MED ORDER — PNEUMOCOCCAL VAC POLYVALENT 25 MCG/0.5ML IJ INJ
0.5000 mL | INJECTION | INTRAMUSCULAR | Status: AC
  Administered 2015-08-17: 0.5 mL via INTRAMUSCULAR
  Filled 2015-08-16: qty 0.5

## 2015-08-16 MED ORDER — VITAMIN B-12 100 MCG PO TABS
100.0000 ug | ORAL_TABLET | Freq: Every day | ORAL | Status: DC
  Administered 2015-08-17: 100 ug via ORAL
  Filled 2015-08-16: qty 1

## 2015-08-16 NOTE — Progress Notes (Signed)
PROGRESS NOTE  William Jordan [id number] DOB: 16-Aug-1946 DOA: 08/14/2015 PCP: [name], MD   HPI: 69 y.o. male with prior history of HTN Anxiety neuropathy arthritis presents with altered mental status. He states that he has been having some cough going on for about a week  Subjective / 24 H Interval events - no complaints today, with intermittent confusion still throughout the day - patient seen also in the afternoon, walking naked in the room  Assessment/Plan: Principal Problem:   Altered mental status Active Problems:   Hyperkalemia   HCAP (healthcare-associated pneumonia)   Hypertension   AKI (acute kidney injury) ([place])   Anemia   Altered mental state  Altered Mental Status - due to underlying metabolic derangement and pneumonia now clearing up, as well as renal failure with potential accumulation of home medications - improving but still with some confusion - neurology consult today - EEG negative - suspect polypharmacy although somewhat acute and he has been on gabapentin and morphine for > 1 year  Hyperkalemia - s/p kayexalate - resolved  HCAP - recent hospital admission - continue Vanc and zosyn empirically with pharmacy to dose adjust for renal function, if cultures remain negative transition to Levaquin  HTN - cotninue with norvasc - continue with lopressor  AKI - likely dehydrated; also has been using Ibuprofen 800 mg and Colchicine - improved with fluids  Anemia - iron deficient, start iron supplementation - start B12 as well as on the low side  Depression - will continue with celexa   Diet: Diet heart healthy/carb modified Room service appropriate?: Yes; Fluid consistency:: Thin Fluids: NS DVT Prophylaxis: heparin  Code Status: Full Code Family Communication: d/w wife bedside  Disposition Plan: home when ready, hopefully 1 day  Barriers to discharge: IV antibiotics, ongoing confusion  Consultants:  None    Procedures:  None    Antibiotics  Anti-infectives    Start     Dose/Rate Route Frequency Ordered Stop   08/15/15 1800  vancomycin (VANCOCIN) IVPB 1000 mg/200 mL premix     1,000 mg 200 mL/hr over 60 Minutes Intravenous Every 12 hours 08/15/15 1450     08/15/15 0600  piperacillin-tazobactam (ZOSYN) IVPB 3.375 g     3.375 g 12.5 mL/hr over 240 Minutes Intravenous Every 8 hours 08/14/15 2316     08/14/15 2230  vancomycin (VANCOCIN) 1,250 mg in sodium chloride 0.9 % 250 mL IVPB  Status:  Discontinued     1,250 mg 166.7 mL/hr over 90 Minutes Intravenous Every 24 hours 08/14/15 2208 08/15/15 1450   08/14/15 2045  piperacillin-tazobactam (ZOSYN) IVPB 3.375 g     3.375 g 100 mL/hr over 30 Minutes Intravenous  Once 08/14/15 2031 08/14/15 2113   08/14/15 2030  cefTRIAXone (ROCEPHIN) 1 g in dextrose 5 % 50 mL IVPB  Status:  Discontinued     1 g 100 mL/hr over 30 Minutes Intravenous  Once 08/14/15 2026 08/14/15 2029   08/14/15 2030  azithromycin (ZITHROMAX) 500 mg in dextrose 5 % 250 mL IVPB  Status:  Discontinued     500 mg 250 mL/hr over 60 Minutes Intravenous  Once 08/14/15 2026 08/14/15 2031   08/14/15 2030  piperacillin-tazobactam (ZOSYN) IVPB 2.25 g  Status:  Discontinued     2.25 g 100 mL/hr over 30 Minutes Intravenous  Once 08/14/15 2029 08/14/15 2031       Studies  Dg Chest 2 View  08/14/2015  CLINICAL DATA:  69 year old male with shortness of breath x1 day EXAM: CHEST  2 VIEW COMPARISON:  Radiograph dated 06/15/2015 FINDINGS: Single-view of the chest demonstrate minimal increased vascular and interstitial markings which may represent mild edema. An area of minimally increased opacity is noted in the right mid lung field which may be related to subsegmental atelectatic changes. Developing pneumonia is not excluded. There is no focal consolidation, pleural effusion, or pneumothorax. The left costophrenic angle is excluded from the image. The cardiac silhouette appears similar to  the prior study. There is osteopenia with multilevel degenerative changes of the spine. Multilevel mid and lower thoracic as well as upper vertebral compression fractures again noted. A spinal stimulator is is seen. There is a left shoulder arthroplasty. Old healed right clavicular fracture. No acute fracture identified. IMPRESSION: No focal consolidation. Right mid lung field focal subsegmental atelectasis versus less likely developing pneumonia. Clinical correlation and follow-up recommended. Electronically Signed   By: Anner Crete M.D.   On: 08/14/2015 20:12    Objective  Filed Vitals:   08/15/15 0603 08/15/15 0945 08/15/15 2002 08/16/15 0447  BP: 155/73 168/85 149/72 156/82  Pulse: 87 96 83 80  Temp: 98.7 F (37.1 C) 98.4 F (36.9 C) 99 F (37.2 C) 98.7 F (37.1 C)  TempSrc: Oral Oral    Resp: 16 18 18 18   Height:      Weight:   84.823 kg (187 lb)   SpO2: 91% 99% 95% 98%    Intake/Output Summary (Last 24 hours) at 08/16/15 1703 Last data filed at 08/16/15 1500  Gross per 24 hour  Intake   1620 ml  Output      3 ml  Net   1617 ml   Filed Weights   08/14/15 1754 08/14/15 2337 08/15/15 2002  Weight: 87.317 kg (192 lb 8 oz) 88.5 kg (195 lb 1.7 oz) 84.823 kg (187 lb)    Exam:  GENERAL: NAD, anxious  HEENT: no scleral icterus, PERRL  NECK: supple, no LAD  LUNGS: CTA biL, no wheezing  HEART: RRR without MRG  ABDOMEN: soft, non tender  EXTREMITIES: no clubbing/cyanosis  Data Reviewed: Basic Metabolic Panel:  Recent Labs Lab 08/14/15 1906 08/15/15 0016 08/15/15 0218 08/16/15 0644  NA 131* 133* 133* 134*  K 5.6* 4.5 4.4 3.5  CL 91* 97* 98* 95*  CO2 27 26 25 29   GLUCOSE 102* 104* 101* 109*  BUN 47* 39* 37* 10  CREATININE 2.26* 1.59* 1.31* 0.79  CALCIUM 8.8* 8.2* 8.4* 8.8*   Liver Function Tests:  Recent Labs Lab 08/14/15 1906 08/15/15 0016 08/15/15 0218  AST 42* 41 42*  ALT 19 18 19   ALKPHOS 65 57 59  BILITOT 0.6 0.8 0.9  PROT 6.1* 6.0* 6.2*   ALBUMIN 3.4* 3.2* 3.4*   CBC:  Recent Labs Lab 08/14/15 1906 08/15/15 0016 08/15/15 0218  WBC 13.1* 9.3 9.3  NEUTROABS 10.7* 6.9  --   HGB 9.6* 9.2* 9.7*  HCT 29.8* 28.4*  28.0* 29.8*  MCV 86.6 85.5 85.6  PLT 210 183 180   CBG:  Recent Labs Lab 08/14/15 2302 08/15/15 0742 08/16/15 0826  GLUCAP 115* 91 95    Scheduled Meds: . amLODipine  10 mg Oral Daily  . citalopram  20 mg Oral Daily  . folic acid  1 mg Oral Daily  . gabapentin  600 mg Oral 5 X Daily  . heparin  5,000 Units Subcutaneous 3 times per day  . metoprolol  50 mg Oral BID  . morphine  60 mg Oral 3 times per day  . multivitamin  with minerals  1 tablet Oral Daily  . piperacillin-tazobactam (ZOSYN)  IV  3.375 g Intravenous Q8H  . [START ON 08/17/2015] pneumococcal 23 valent vaccine  0.5 mL Intramuscular Tomorrow-1000  . sodium chloride  3 mL Intravenous Q12H  . tamsulosin  0.4 mg Oral Daily  . thiamine  100 mg Oral Daily  . traZODone  50 mg Oral QHS  . vancomycin  1,000 mg Intravenous Q12H   Continuous Infusions: . sodium chloride 1,000 mL (08/14/15 2352)    Marzetta Board, MD [facility] Pager [phone]. If 7 PM - 7 AM, please contact night-coverage at [email or web address], password [facility] 08/16/2015, 5:03 PM  LOS: 2 days

## 2015-08-16 NOTE — Consult Note (Signed)
NEURO HOSPITALIST CONSULT NOTE   Requestig physician: Dr. Letta Median   Reason for Consult:Acute encephalopahty  HPI:                                                                                                                                          William Jordan is an 69 y.o. male with PMH of HTN, Anxiety, Neuropathy, OA of multiple sites status post multiple orthopaedic Sx, chronic low back pain s/p spinal stimulator who present to [facility] 2 days ago with CC of confusion.  His wife is present at bedside and assist with the history.  They report that over the past few months he has had a few episodes of altered mental status.  In September he was admitted for a similar presentation that was ultimately attributed to sepsis due to HCAP with associated acute renal failure.  His wife reports that after returning home from that episode he returned to his normal self without any cognitive limitations.  However she notes that on the day of admission he was increasingly "figety" and confused with a cough so she called EMS to bring him to the hospital.  He reports that he felt very "off" and that he is still not feeling "quite right" but that he is feeling much better than when he first arrived. On arrival he was felt to have a new PNA as well as acute renal failure.  He was started on broad spectrum antibiotics and admitted.  Of note patient takes MS Contin 60mg  (BID and occasionally TID), Gabapentin 600mg  five times daily.  He reports that the gabapentin has been long standing and the MS contin was added earlier this year.  He manages his own medications at home without assistance.  Past Medical History  Diagnosis Date  . Hypertension   . Anxiety     takes Celexa  . Enlarged prostate   . Neuropathy ([place])   . Arthritis   . History of blood transfusion     Past Surgical History  Procedure Laterality Date  . Joint replacement Left     knee, x 2  . Joint replacement Right     hip   . Joint replacement Left     partial shoulder  . Elbow surgery Right   . Hernia repair      umbilical  . Tonsillectomy    . Vasectomy    . Colonoscopy      one polyp removed - benign  . Spinal cord stimulator insertion N/A 09/28/2014    Procedure: LUMBAR SPINAL CORD STIMULATOR INSERTION;  Surgeon: Melina Schools, MD;  Location: [facility];  Service: Orthopedics;  Laterality: N/A;    Family History  Problem Relation Age of Onset  . Heart disease Father     Family History:  Mother deceased Father Heart disease, deceased  Social History:  reports that he has been smoking E-cigarettes.  He has quit using smokeless tobacco. His smokeless tobacco use included Snuff. He reports that he does not drink alcohol or use illicit drugs.  No Known Allergies  MEDICATIONS:                                                                                                                     I have reviewed the patient's current medications.   ROS:                                                                                                                                       History obtained from the patient  General ROS: negative for - chills, fatigue, fever, night sweats, weight gain or weight loss Psychological ROS: negative for - behavioral disorder, hallucinations, memory difficulties, mood swings or suicidal ideation Ophthalmic ROS: negative for - blurry vision, double vision, eye pain or loss of vision ENT ROS: negative for - epistaxis, nasal discharge, oral lesions, sore throat, tinnitus or vertigo Allergy and Immunology ROS: negative for - hives or itchy/watery eyes Hematological and Lymphatic ROS: negative for - bleeding problems, bruising or swollen lymph nodes Endocrine ROS: negative for - galactorrhea, hair pattern changes, polydipsia/polyuria or temperature intolerance Respiratory ROS: negative for - cough, hemoptysis, shortness of breath or wheezing Cardiovascular ROS: negative for -  chest pain, dyspnea on exertion, edema or irregular heartbeat Gastrointestinal ROS: negative for - abdominal pain, diarrhea, hematemesis, nausea/vomiting or stool incontinence Genito-Urinary ROS: negative for - dysuria, hematuria, incontinence or urinary frequency/urgency Musculoskeletal ROS: negative for - joint swelling or muscular weakness Neurological ROS: as noted in HPI Dermatological ROS: negative for rash and skin lesion changes   Blood pressure 156/82, pulse 80, temperature 98.7 F (37.1 C), temperature source Oral, resp. rate 18, height 5\' 10"  (1.778 m), weight 187 lb (84.823 kg), SpO2 98 %.   Neurologic Examination:  HEENT-  Normocephalic, no lesions, without obvious abnormality.  Normal external eye and conjunctiva.  Normal TM's bilaterally.  Normal auditory canals and external ears. Normal external nose, mucus membranes and septum.  Normal pharynx. Cardiovascular- regular rate and rhythm, S1, S2 normal, no murmur, click, rub or gallop, pulses palpable throughout   Lungs- chest clear, no wheezing, rales, normal symmetric air entry, Heart exam - S1, S2 normal, no murmur, no gallop, rate regular Abdomen- soft, non-tender; bowel sounds normal; no masses,  no organomegaly Extremities- less then 2 second capillary refill Lymph-no adenopathy palpable Musculoskeletal-no muscular tenderness noted, multiple surgical scars noted over both knees, left shoulder Skin-warm and dry, no hyperpigmentation, vitiligo, or suspicious lesions  Neurological Examination Mental Status: Alert, oriented to person place, and Month/year/ current events but not date, thought content appropriate.  Speech fluent without evidence of aphasia.  Able to follow 3 step commands without difficulty. Cranial Nerves: II: Discs flat bilaterally; Visual fields grossly normal, pupils equal, round, reactive to light and  accommodation III,IV, VI: ptosis not present, extra-ocular motions intact bilaterally V,VII: smile symmetric, facial light touch sensation normal bilaterally VIII: hearing normal bilaterally IX,X: uvula rises symmetrically XI: bilateral shoulder shrug XII: midline tongue extension Motor: Right : Upper extremity   5/5    Left:     Upper extremity   5/5  Lower extremity   5/5     Lower extremity   5/5 Tone and bulk:normal tone throughout; no atrophy noted Sensory: Pinprick  intact throughout, bilaterally, light touch decreased to left lower extremity (per patient chronic) Deep Tendon Reflexes: 1+ and symmetric throughout, absent DTR Left patellar (previous knee replacement Plantars: Right: downgoing   Left: downgoing Cerebellar: normal finger-to-nose, normal rapid alternating movements and normal heel-to-shin test Gait: normal gait and station      Lab Results: Basic Metabolic Panel:  Recent Labs Lab 08/14/15 1906 08/15/15 0016 08/15/15 0218 08/16/15 0644  NA 131* 133* 133* 134*  K 5.6* 4.5 4.4 3.5  CL 91* 97* 98* 95*  CO2 27 26 25 29   GLUCOSE 102* 104* 101* 109*  BUN 47* 39* 37* 10  CREATININE 2.26* 1.59* 1.31* 0.79  CALCIUM 8.8* 8.2* 8.4* 8.8*    Liver Function Tests:  Recent Labs Lab 08/14/15 1906 08/15/15 0016 08/15/15 0218  AST 42* 41 42*  ALT 19 18 19   ALKPHOS 65 57 59  BILITOT 0.6 0.8 0.9  PROT 6.1* 6.0* 6.2*  ALBUMIN 3.4* 3.2* 3.4*   No results for input(s): LIPASE, AMYLASE in the last 168 hours. No results for input(s): AMMONIA in the last 168 hours.  CBC:  Recent Labs Lab 08/14/15 1906 08/15/15 0016 08/15/15 0218  WBC 13.1* 9.3 9.3  NEUTROABS 10.7* 6.9  --   HGB 9.6* 9.2* 9.7*  HCT 29.8* 28.4* 29.8*  MCV 86.6 85.5 85.6  PLT 210 183 180    Cardiac Enzymes: No results for input(s): CKTOTAL, CKMB, CKMBINDEX, TROPONINI in the last 168 hours.  Lipid Panel: No results for input(s): CHOL, TRIG, HDL, CHOLHDL, VLDL, LDLCALC in the last 168  hours.  CBG:  Recent Labs Lab 08/14/15 2302 08/15/15 0742 08/16/15 0826  GLUCAP 115* 91 95    Microbiology: Results for orders placed or performed during the hospital encounter of 06/15/15  Culture, blood (routine x 2)     Status: None   Collection Time: 06/15/15  8:05 PM  Result Value Ref Range Status   Specimen Description BLOOD RIGHT HAND  Final   Special Requests BOTTLES DRAWN AEROBIC  AND ANAEROBIC 5CC  Final   Culture NO GROWTH 5 DAYS  Final   Report Status 06/20/2015 FINAL  Final  Culture, blood (routine x 2)     Status: None   Collection Time: 06/15/15  9:00 PM  Result Value Ref Range Status   Specimen Description BLOOD RIGHT HAND  Final   Special Requests BOTTLES DRAWN AEROBIC ONLY 10CC  Final   Culture NO GROWTH 5 DAYS  Final   Report Status 06/20/2015 FINAL  Final  Body fluid culture     Status: None   Collection Time: 06/16/15 12:59 AM  Result Value Ref Range Status   Specimen Description SYNOVIAL LEFT WRIST  Final   Special Requests NONE  Final   Gram Stain   Final    ABUNDANT WBC PRESENT,BOTH PMN AND MONONUCLEAR NO ORGANISMS SEEN    Culture NO GROWTH 3 DAYS  Final   Report Status 06/19/2015 FINAL  Final  Urine culture     Status: None   Collection Time: 06/16/15  2:22 AM  Result Value Ref Range Status   Specimen Description URINE, CLEAN CATCH  Final   Special Requests NONE  Final   Culture NO GROWTH 1 DAY  Final   Report Status 06/17/2015 FINAL  Final  Body fluid culture     Status: None   Collection Time: 06/16/15  1:33 PM  Result Value Ref Range Status   Specimen Description SYNOVIAL LEFT WRIST  Final   Special Requests NONE  Final   Gram Stain ABUNDANT POLYMORPHONUCLEAR NO ORGANISMS SEEN   Final   Culture NO GROWTH 3 DAYS  Final   Report Status 06/20/2015 FINAL  Final    Coagulation Studies:  Recent Labs  08/15/15 0016 08/15/15 0218  LABPROT 15.4* 15.7*  INR 1.20 1.23    Imaging: Dg Chest 2 View  08/14/2015  CLINICAL DATA:   69 year old male with shortness of breath x1 day EXAM: CHEST  2 VIEW COMPARISON:  Radiograph dated 06/15/2015 FINDINGS: Single-view of the chest demonstrate minimal increased vascular and interstitial markings which may represent mild edema. An area of minimally increased opacity is noted in the right mid lung field which may be related to subsegmental atelectatic changes. Developing pneumonia is not excluded. There is no focal consolidation, pleural effusion, or pneumothorax. The left costophrenic angle is excluded from the image. The cardiac silhouette appears similar to the prior study. There is osteopenia with multilevel degenerative changes of the spine. Multilevel mid and lower thoracic as well as upper vertebral compression fractures again noted. A spinal stimulator is is seen. There is a left shoulder arthroplasty. Old healed right clavicular fracture. No acute fracture identified. IMPRESSION: No focal consolidation. Right mid lung field focal subsegmental atelectasis versus less likely developing pneumonia. Clinical correlation and follow-up recommended. Electronically Signed   By: Anner Crete M.D.   On: 08/14/2015 20:12    Assessment/Plan: Acute encephalopathy likely due to metabolic derangement/ HCAP/ polypharmacy - EEG shows no epileptiform activity - Unable to obtain MRI due to spinal stimulator - Patient is clinically improving with improvement of renal function/ treatment of pneumonia/ holding narcotics - Continue supportive care, suspect component of polypharmacy/ accumulation of metabolites in the setting of acute renal failure (gabapentin and MS Cotin), reintroduce at lower dose (recommend half of MS Cotin, reduce gabapentin to 600mg  TID).  Patient is on a SSRI and as outpatient may consider change from celexa to duloxetine for added neuropathic pain treatment.  WOULD recommend adding muscule relaxant>> baclofen - Vitamin  B12: 232 >>> start oral supplementation  Acute renal  failure HCAP - right middle lobe, ? Aspiration due to polypharmacy Vitamin B12 deficency Normocytic anemia    Lucious Groves, DO [facility] PGY-2 Pager: [phone] 08/16/2015 11:26 AM

## 2015-08-16 NOTE — Procedures (Signed)
ELECTROENCEPHALOGRAM REPORT   Patient: William Jordan        Age: 70 y.o.        Sex: male Referring Physician: Dr Mikle Bosworth Report Date:  08/16/2015        Interpreting Physician: Hulen Luster  History: William Jordan is an 69 y.o. male on multiple medications admitted with AMS  Medications:  I have reviewed the patient's current medications.  Conditions of Recording:  This is a 16 channel EEG carried out with the patient in the drowsy state.  Description:  The waking background activity consists of a low voltage, symmetrical,poorly organized, mix of theta and delta activity. There are briefly periods of poorly sustained posterior alpha rhythm noted. No focal slowing or epileptiform activity noted Hyperventilation was not performed. Intermittent photic stimulation was not performed.    IMPRESSION: Abnormal EEG due to the presence of generalized slowing indicating a mild to moderate cerebral disturbance (encephalopathy). No epileptiform activity noted.    Jim Like, DO [facility] [phone]  If 7pm- 7am, please page neurology on call as listed in [place]. 08/16/2015, 10:33 AM

## 2015-08-16 NOTE — Progress Notes (Signed)
EEG completed; results pending.    

## 2015-08-16 NOTE — Care Management Note (Addendum)
Case Management Note  Patient Details  Name: William Jordan MRN: [id number] Date of Birth: 05/12/1946  Subjective/Objective:            CM following for progression and d/c planning.        Action/Plan: Pt from home with wife, eval for possible candidate for pneumonia study, message sent to Eliott Nine RN with [facility] for possible followup. Pt does not qualify for the Pneumonia due to his insurance provider and his PCP is in [facility]. This was validated with V Brewer of [place].  Expected Discharge Date:                  Expected Discharge Plan:  [place]  In-House Referral:  NA  Discharge planning Services  CM Consult  Post Acute Care Choice:  Home Health Choice offered to:     DME Arranged:    DME Agency:     HH Arranged:  RN, PT HH Agency:     Status of Service:  In process, will continue to follow  Medicare Important Message Given:    Date Medicare IM Given:    Medicare IM give by:    Date Additional Medicare IM Given:    Additional Medicare Important Message give by:     If discussed at [place] of Stay Meetings, dates discussed:    Additional Comments:  Adron Bene, RN 08/16/2015, 11:09 AM

## 2015-08-17 DIAGNOSIS — M549 Dorsalgia, unspecified: Secondary | ICD-10-CM

## 2015-08-17 DIAGNOSIS — J189 Pneumonia, unspecified organism: Secondary | ICD-10-CM | POA: Diagnosis not present

## 2015-08-17 DIAGNOSIS — M62830 Muscle spasm of back: Secondary | ICD-10-CM

## 2015-08-17 DIAGNOSIS — G8929 Other chronic pain: Secondary | ICD-10-CM

## 2015-08-17 LAB — BASIC METABOLIC PANEL
Anion gap: 7 (ref 5–15)
BUN: 6 mg/dL (ref 6–20)
CO2: 28 mmol/L (ref 22–32)
Calcium: 8.3 mg/dL — ABNORMAL LOW (ref 8.9–10.3)
Chloride: 100 mmol/L — ABNORMAL LOW (ref 101–111)
Creatinine, Ser: 0.73 mg/dL (ref 0.61–1.24)
GFR calc Af Amer: >60 mL/min (ref >60)
GLUCOSE: 115 mg/dL — AB (ref 65–99)
POTASSIUM: 2.9 mmol/L — AB (ref 3.5–5.1)
Sodium: 135 mmol/L (ref 135–145)

## 2015-08-17 LAB — GLUCOSE, CAPILLARY: Glucose-Capillary: 99 mg/dL (ref 65–99)

## 2015-08-17 MED ORDER — GABAPENTIN 600 MG PO TABS: 600.0000 mg | ORAL_TABLET | Freq: Three times a day (TID) | ORAL | Status: DC

## 2015-08-17 MED ORDER — MORPHINE SULFATE ER 60 MG PO TBCR: 60.0000 mg | EXTENDED_RELEASE_TABLET | Freq: Two times a day (BID) | ORAL | Status: DC

## 2015-08-17 MED ORDER — GABAPENTIN 600 MG PO TABS: 600.0000 mg | ORAL_TABLET | Freq: Two times a day (BID) | ORAL | Status: DC

## 2015-08-17 MED ORDER — VITAMIN B-12 100 MCG PO TABS: 100.0000 ug | ORAL_TABLET | Freq: Every day | ORAL | Status: DC

## 2015-08-17 MED ORDER — LEVOFLOXACIN 500 MG PO TABS: 500.0000 mg | ORAL_TABLET | Freq: Every day | ORAL | Status: DC

## 2015-08-17 MED ORDER — BACLOFEN 10 MG PO TABS: 10.0000 mg | ORAL_TABLET | Freq: Every day | ORAL | Status: DC

## 2015-08-17 MED ORDER — POTASSIUM CHLORIDE CRYS ER 20 MEQ PO TBCR
40.0000 meq | EXTENDED_RELEASE_TABLET | ORAL | Status: AC
  Administered 2015-08-17 (×2): 40 meq via ORAL
  Filled 2015-08-17 (×2): qty 2

## 2015-08-17 MED ORDER — GABAPENTIN 600 MG PO TABS: 1200.0000 mg | ORAL_TABLET | Freq: Every day | ORAL | Status: DC

## 2015-08-17 NOTE — Discharge Instructions (Signed)
Follow with [facility], MD in 5-7 days  Monitor for signs of dehydration including weight loss Ask your PCP to recheck renal function in 1 week Decrease your Morphine to twice daily Decrease Gabapentin to 600 mg three times daily, can take 1200 mg at night is pain is severe.  Please get a complete blood count and chemistry panel checked by your Primary MD at your next visit, and again as instructed by your Primary MD. Please get your medications reviewed and adjusted by your Primary MD.  Please request your Primary MD to go over all Hospital Tests and Procedure/Radiological results at the follow up, please get all Hospital records sent to your Prim MD by signing hospital release before you go home.  If you had Pneumonia of Lung problems at the Hospital: Please get a 2 view Chest X ray done in 6-8 weeks after hospital discharge or sooner if instructed by your Primary MD.  If you have Congestive Heart Failure: Please call your Cardiologist or Primary MD anytime you have any of the following symptoms:  1) 3 pound weight gain in 24 hours or 5 pounds in 1 week  2) shortness of breath, with or without a dry hacking cough  3) swelling in the hands, feet or stomach  4) if you have to sleep on extra pillows at night in order to breathe  Follow cardiac low salt diet and 1.5 lit/day fluid restriction.  If you have diabetes Accuchecks 4 times/day, Once in AM empty stomach and then before each meal. Log in all results and show them to your primary doctor at your next visit. If any glucose reading is under 80 or above 300 call your primary MD immediately.  If you have Seizure/Convulsions/Epilepsy: Please do not drive, operate heavy machinery, participate in activities at heights or participate in high speed sports until you have seen by Primary MD or a Neurologist and advised to do so again.  If you had Gastrointestinal Bleeding: Please ask your Primary MD to check a complete blood count within one  week of discharge or at your next visit. Your endoscopic/colonoscopic biopsies that are pending at the time of discharge, will also need to followed by your Primary MD.  Get Medicines reviewed and adjusted. Please take all your medications with you for your next visit with your Primary MD  Please request your Primary MD to go over all hospital tests and procedure/radiological results at the follow up, please ask your Primary MD to get all Hospital records sent to his/her office.  If you experience worsening of your admission symptoms, develop shortness of breath, life threatening emergency, suicidal or homicidal thoughts you must seek medical attention immediately by calling 911 or calling your MD immediately  if symptoms less severe.  You must read complete instructions/literature along with all the possible adverse reactions/side effects for all the Medicines you take and that have been prescribed to you. Take any new Medicines after you have completely understood and accpet all the possible adverse reactions/side effects.   Do not drive or operate heavy machinery when taking Pain medications.   Do not take more than prescribed Pain, Sleep and Anxiety Medications  Special Instructions: If you have smoked or chewed Tobacco  in the last 2 yrs please stop smoking, stop any regular Alcohol  and or any Recreational drug use.  Wear Seat belts while driving.  Please note You were cared for by a hospitalist during your hospital stay. If you have any questions about your discharge  medications or the care you received while you were in the hospital after you are discharged, you can call the unit and asked to speak with the hospitalist on call if the hospitalist that took care of you is not available. Once you are discharged, your primary care physician will handle any further medical issues. Please note that NO REFILLS for any discharge medications will be authorized once you are discharged, as it is  imperative that you return to your primary care physician (or establish a relationship with a primary care physician if you do not have one) for your aftercare needs so that they can reassess your need for medications and monitor your lab values.  You can reach the hospitalist office at phone [phone] or fax [phone]   If you do not have a primary care physician, you can call [phone] for a physician referral.  Activity: As tolerated with Full fall precautions use walker/cane & assistance as needed  Diet: regular  Disposition Home

## 2015-08-17 NOTE — Discharge Summary (Addendum)
Physician Discharge Summary  William Jordan [id number] DOB: 1946-01-11 DOA: 08/14/2015  PCP: [name], MD  Admit date: 08/14/2015 Discharge date: 08/17/2015  Time spent: > 30 minutes  Recommendations for Outpatient Follow-up:  1. Follow up with Dr. Truman Hayward in 1 week 2. Repeat renal function in 1 week 3. Continue Levofloxacin for 4 additional days 4. Decreased Morphine from 60 mg TID to 60 mg BID 5. Decrease gabapentin from 600 mg 5 times daily to 600 mg in am, lunch and 1200 pm per neurology recs  Discharge Diagnoses:  Principal Problem:   Acute encephalopathy Active Problems:   Hyperkalemia   HCAP (healthcare-associated pneumonia)   Hypertension   AKI (acute kidney injury) ([place])   Anemia   Altered mental state   Encephalopathy, unspecified   Chronic back pain   Muscle spasm of back  Discharge Condition: stable  Diet recommendation: regular  Filed Weights   08/14/15 2337 08/15/15 2002 08/16/15 2100  Weight: 88.5 kg (195 lb 1.7 oz) 84.823 kg (187 lb) 84.596 kg (186 lb 8 oz)    History of present illness:  See H&P, Labs, Consult and Test reports for all details in brief, patient is a 69 y.o. male with prior history of HTN Anxiety neuropathy arthritis presents with altered mental status.  Hospital Course:  Acute encephalopathy, metabolic - multifactorial, due to underlying metabolic derangement, pneumonia now clearing up, as well as renal failure with potential accumulation of home medications. With IVF his renal function has normalized and with antibiotics his respiratory symptoms have improved, patient's mental status has returned to baseline. Neurology was consulted and have followed patient while hospitalized. EEG was negative.  Hyperkalemia - s/p kayexalate, resolved. Hypokalemia on discharge was repleted. Repeat BMP in 1 week as above.  HCAP - recent hospital admission, started on Vanc and zosyn empirically, upon clinical improvement his antibiotics were transitioned  to levaquin which is to be continued to 4 additional days to complete a 7 day course.  HTN  AKI - likely dehydrated; also has been using Ibuprofen 800 mg and Colchicine which were recommended to be discontinued at home. Improved with fluids, renal function normalized on discharge Anemia - start B12 as well as on the low side Depression - will continue with celexa  Procedures:  None    Consultations:  Neurology   Discharge Exam: Filed Vitals:   08/16/15 1753 08/16/15 2100 08/17/15 0500 08/17/15 1238  BP: 150/76 124/68 134/75 145/77  Pulse: 64 65 55 52  Temp: 98.9 F (37.2 C) 98.1 F (36.7 C) 97.9 F (36.6 C) 97.7 F (36.5 C)  TempSrc: Oral Oral Oral Oral  Resp: 16 18 18 18   Height:      Weight:  84.596 kg (186 lb 8 oz)    SpO2: 97% 95% 95% 97%   General: NAD Cardiovascular: RRR Respiratory: CTA biL  Discharge Instructions Activity:  As tolerated   Get Medicines reviewed and adjusted: Please take all your medications with you for your next visit with your Primary MD  Please request your Primary MD to go over all hospital tests and procedure/radiological results at the follow up, please ask your Primary MD to get all Hospital records sent to his/her office.  If you experience worsening of your admission symptoms, develop shortness of breath, life threatening emergency, suicidal or homicidal thoughts you must seek medical attention immediately by calling 911 or calling your MD immediately if symptoms less severe.  You must read complete instructions/literature along with all the possible adverse reactions/side  effects for all the Medicines you take and that have been prescribed to you. Take any new Medicines after you have completely understood and accpet all the possible adverse reactions/side effects.   Do not drive when taking Pain medications.   Do not take more than prescribed Pain, Sleep and Anxiety Medications  Special Instructions: If you have smoked or  chewed Tobacco in the last 2 yrs please stop smoking, stop any regular Alcohol and or any Recreational drug use.  Wear Seat belts while driving.  Please note  You were cared for by a hospitalist during your hospital stay. Once you are discharged, your primary care physician will handle any further medical issues. Please note that NO REFILLS for any discharge medications will be authorized once you are discharged, as it is imperative that you return to your primary care physician (or establish a relationship with a primary care physician if you do not have one) for your aftercare needs so that they can reassess your need for medications and monitor your lab values.    Medication List    STOP taking these medications        colchicine 0.6 MG tablet  Commonly known as:  COLCRYS     gabapentin 300 MG capsule  Commonly known as:  NEURONTIN  Replaced by:  gabapentin 600 MG tablet  You also have another medication with the same name that you need to continue taking as instructed.     predniSONE 20 MG tablet  Commonly known as:  DELTASONE     senna-docusate 8.6-50 MG tablet  Commonly known as:  Senokot-S      TAKE these medications        amLODipine 10 MG tablet  Commonly known as:  NORVASC  Take 1 tablet (10 mg total) by mouth daily.     baclofen 10 MG tablet  Commonly known as:  LIORESAL  Take 1 tablet (10 mg total) by mouth at bedtime.     citalopram 20 MG tablet  Commonly known as:  CELEXA  Take 20 mg by mouth daily.     docusate sodium 100 MG capsule  Commonly known as:  COLACE  Take 1 capsule (100 mg total) by mouth 3 (three) times daily as needed for mild constipation.     gabapentin 600 MG tablet  Commonly known as:  NEURONTIN  Take 2 tablets (1,200 mg total) by mouth at bedtime.     gabapentin 600 MG tablet  Commonly known as:  NEURONTIN  Take 1 tablet (600 mg total) by mouth 2 (two) times daily. Morning and Lunch  Start taking on:  08/18/2015     levofloxacin  500 MG tablet  Commonly known as:  LEVAQUIN  Take 1 tablet (500 mg total) by mouth daily.     metoprolol 50 MG tablet  Commonly known as:  LOPRESSOR  Take 1 tablet (50 mg total) by mouth 2 (two) times daily.     morphine 60 MG 12 hr tablet  Commonly known as:  MS CONTIN  Take 1 tablet (60 mg total) by mouth every 12 (twelve) hours.     polyethylene glycol packet  Commonly known as:  MIRALAX / GLYCOLAX  Take 17 g by mouth daily.     tamsulosin 0.4 MG Caps capsule  Commonly known as:  FLOMAX  Take 0.4 mg by mouth daily.     vitamin B-12 100 MCG tablet  Commonly known as:  CYANOCOBALAMIN  Take 1 tablet (100 mcg total) by mouth  daily.           Follow-up Information    Follow up with [facility], MD. Schedule an appointment as soon as possible for a visit in 3 days.   Specialty:  Internal Medicine   Why:  Appointment date: 08/20/15@10 :15am. Please arrive at least 90minutes early. Thank you.    Contact information:   [place], [place] [address] [phone]      The results of significant diagnostics from this hospitalization (including imaging, microbiology, ancillary and laboratory) are listed below for reference.    Significant Diagnostic Studies: Dg Chest 2 View  08/14/2015  CLINICAL DATA:  69 year old male with shortness of breath x1 day EXAM: CHEST  2 VIEW COMPARISON:  Radiograph dated 06/15/2015 FINDINGS: Single-view of the chest demonstrate minimal increased vascular and interstitial markings which may represent mild edema. An area of minimally increased opacity is noted in the right mid lung field which may be related to subsegmental atelectatic changes. Developing pneumonia is not excluded. There is no focal consolidation, pleural effusion, or pneumothorax. The left costophrenic angle is excluded from the image. The cardiac silhouette appears similar to the prior study. There is osteopenia with multilevel degenerative changes of the spine.  Multilevel mid and lower thoracic as well as upper vertebral compression fractures again noted. A spinal stimulator is is seen. There is a left shoulder arthroplasty. Old healed right clavicular fracture. No acute fracture identified. IMPRESSION: No focal consolidation. Right mid lung field focal subsegmental atelectasis versus less likely developing pneumonia. Clinical correlation and follow-up recommended. Electronically Signed   By: Anner Crete M.D.   On: 08/14/2015 20:12   Microbiology: Recent Results (from the past 240 hour(s))  Culture, blood (x 2)     Status: None (Preliminary result)   Collection Time: 08/15/15 12:26 AM  Result Value Ref Range Status   Specimen Description BLOOD RIGHT HAND  Final   Special Requests   Final    BOTTLES DRAWN AEROBIC AND ANAEROBIC 6CC BLUE 5CC PURPLE   Culture NO GROWTH 2 DAYS  Final   Report Status PENDING  Incomplete  Culture, blood (x 2)     Status: None (Preliminary result)   Collection Time: 08/15/15 12:33 AM  Result Value Ref Range Status   Specimen Description BLOOD LEFT ARM  Final   Special Requests BOTTLES DRAWN AEROBIC AND ANAEROBIC 6CC  Final   Culture NO GROWTH 2 DAYS  Final   Report Status PENDING  Incomplete   Labs: Basic Metabolic Panel:  Recent Labs Lab 08/14/15 1906 08/15/15 0016 08/15/15 0218 08/16/15 0644 08/17/15 0650  NA 131* 133* 133* 134* 135  K 5.6* 4.5 4.4 3.5 2.9*  CL 91* 97* 98* 95* 100*  CO2 27 26 25 29 28   GLUCOSE 102* 104* 101* 109* 115*  BUN 47* 39* 37* 10 6  CREATININE 2.26* 1.59* 1.31* 0.79 0.73  CALCIUM 8.8* 8.2* 8.4* 8.8* 8.3*   Liver Function Tests:  Recent Labs Lab 08/14/15 1906 08/15/15 0016 08/15/15 0218  AST 42* 41 42*  ALT 19 18 19   ALKPHOS 65 57 59  BILITOT 0.6 0.8 0.9  PROT 6.1* 6.0* 6.2*  ALBUMIN 3.4* 3.2* 3.4*   CBC:  Recent Labs Lab 08/14/15 1906 08/15/15 0016 08/15/15 0218  WBC 13.1* 9.3 9.3  NEUTROABS 10.7* 6.9  --   HGB 9.6* 9.2* 9.7*  HCT 29.8* 28.4*  28.0*  29.8*  MCV 86.6 85.5 85.6  PLT 210 183 180   CBG:  Recent Labs  Lab 08/14/15 2302 08/15/15 0742 08/16/15 0826 08/17/15 0809  GLUCAP 115* 91 95 99     Signed:  Pleshette Tomasini  [facility] 08/17/2015, 3:56 PM

## 2015-08-17 NOTE — Progress Notes (Signed)
[name] to be D/C'd Home per MD order.  Discussed prescriptions and follow up appointments with the patient. Prescriptions given to patient, medication list explained in detail. Pt verbalized understanding.    Medication List    STOP taking these medications        colchicine 0.6 MG tablet  Commonly known as:  COLCRYS     gabapentin 300 MG capsule  Commonly known as:  NEURONTIN  Replaced by:  gabapentin 600 MG tablet  You also have another medication with the same name that you need to continue taking as instructed.     predniSONE 20 MG tablet  Commonly known as:  DELTASONE     senna-docusate 8.6-50 MG tablet  Commonly known as:  Senokot-S      TAKE these medications        amLODipine 10 MG tablet  Commonly known as:  NORVASC  Take 1 tablet (10 mg total) by mouth daily.     baclofen 10 MG tablet  Commonly known as:  LIORESAL  Take 1 tablet (10 mg total) by mouth at bedtime.     citalopram 20 MG tablet  Commonly known as:  CELEXA  Take 20 mg by mouth daily.     docusate sodium 100 MG capsule  Commonly known as:  COLACE  Take 1 capsule (100 mg total) by mouth 3 (three) times daily as needed for mild constipation.     gabapentin 600 MG tablet  Commonly known as:  NEURONTIN  Take 2 tablets (1,200 mg total) by mouth at bedtime.     gabapentin 600 MG tablet  Commonly known as:  NEURONTIN  Take 1 tablet (600 mg total) by mouth 2 (two) times daily. Morning and Lunch  Start taking on:  08/18/2015     levofloxacin 500 MG tablet  Commonly known as:  LEVAQUIN  Take 1 tablet (500 mg total) by mouth daily.     metoprolol 50 MG tablet  Commonly known as:  LOPRESSOR  Take 1 tablet (50 mg total) by mouth 2 (two) times daily.     morphine 60 MG 12 hr tablet  Commonly known as:  MS CONTIN  Take 1 tablet (60 mg total) by mouth every 12 (twelve) hours.     polyethylene glycol packet  Commonly known as:  MIRALAX / GLYCOLAX  Take 17 g by mouth daily.     tamsulosin 0.4 MG  Caps capsule  Commonly known as:  FLOMAX  Take 0.4 mg by mouth daily.     vitamin B-12 100 MCG tablet  Commonly known as:  CYANOCOBALAMIN  Take 1 tablet (100 mcg total) by mouth daily.        Filed Vitals:   08/17/15 0500 08/17/15 1238  BP: 134/75 145/77  Pulse: 55 52  Temp: 97.9 F (36.6 C) 97.7 F (36.5 C)  Resp: 18 18    Skin clean, dry and intact without evidence of skin break down, no evidence of skin tears noted. IV catheter discontinued intact. Site without signs and symptoms of complications. Dressing and pressure applied. Pt denies pain at this time. No complaints noted.  An After Visit Summary was printed and given to the patient. Patient escorted via [place], and D/C home via private auto.  Carole Civil RN [facility] Phone [phone]

## 2015-08-17 NOTE — Care Management Important Message (Signed)
Important Message  Patient Details  Name: William Jordan MRN: [id number] Date of Birth: 02-18-1946   Medicare Important Message Given:  Yes    Marice Angelino P [place] 08/17/2015, 2:23 PM

## 2015-08-17 NOTE — Progress Notes (Signed)
Subjective: Feels back to normal.  Reports usually back pain is worst in AM, main reason he takes pain medications   Objective: Current vital signs: BP 134/75 mmHg  Pulse 55  Temp(Src) 97.9 F (36.6 C) (Oral)  Resp 18  Ht 5\' 10"  (1.778 m)  Wt 186 lb 8 oz (84.596 kg)  BMI 26.76 kg/m2  SpO2 95% Vital signs in last 24 hours: Temp:  [97.9 F (36.6 C)-98.9 F (37.2 C)] 97.9 F (36.6 C) (12/02 0500) Pulse Rate:  [55-65] 55 (12/02 0500) Resp:  [16-18] 18 (12/02 0500) BP: (124-150)/(68-76) 134/75 mmHg (12/02 0500) SpO2:  [95 %-97 %] 95 % (12/02 0500) Weight:  [186 lb 8 oz (84.596 kg)] 186 lb 8 oz (84.596 kg) (12/01 2100)  Intake/Output from previous day: 12/01 0701 - 12/02 0700 In: 2250 [P.O.:600; I.V.:1150; IV Piggyback:500] Out: 3 [Urine:3] Intake/Output this shift:   Nutritional status: Diet heart healthy/carb modified Room service appropriate?: Yes; Fluid consistency:: Thin  Neurologic Exam: General: NAD, sitting in chair CV: RRR Mental Status: Alert, oriented x4, thought content appropriate.  Speech fluent without evidence of aphasia.  Cranial Nerves: No obvious deficit  Motor: Right : Upper extremity   5/5    Left:     Upper extremity   5/5  Lower extremity   5/5     Lower extremity   5/5 Tone and bulk:normal tone throughout; no atrophy noted Sensory: not assessed Deep Tendon Reflexes:  Not assessed  CV: pulses palpable throughout     Lab Results: Basic Metabolic Panel:  Recent Labs Lab 08/14/15 1906 08/15/15 0016 08/15/15 0218 08/16/15 0644 08/17/15 0650  NA 131* 133* 133* 134* 135  K 5.6* 4.5 4.4 3.5 2.9*  CL 91* 97* 98* 95* 100*  CO2 27 26 25 29 28   GLUCOSE 102* 104* 101* 109* 115*  BUN 47* 39* 37* 10 6  CREATININE 2.26* 1.59* 1.31* 0.79 0.73  CALCIUM 8.8* 8.2* 8.4* 8.8* 8.3*    Liver Function Tests:  Recent Labs Lab 08/14/15 1906 08/15/15 0016 08/15/15 0218  AST 42* 41 42*  ALT 19 18 19   ALKPHOS 65 57 59  BILITOT 0.6 0.8 0.9  PROT  6.1* 6.0* 6.2*  ALBUMIN 3.4* 3.2* 3.4*   No results for input(s): LIPASE, AMYLASE in the last 168 hours. No results for input(s): AMMONIA in the last 168 hours.  CBC:  Recent Labs Lab 08/14/15 1906 08/15/15 0016 08/15/15 0218  WBC 13.1* 9.3 9.3  NEUTROABS 10.7* 6.9  --   HGB 9.6* 9.2* 9.7*  HCT 29.8* 28.4*  28.0* 29.8*  MCV 86.6 85.5 85.6  PLT 210 183 180    Cardiac Enzymes: No results for input(s): CKTOTAL, CKMB, CKMBINDEX, TROPONINI in the last 168 hours.  Lipid Panel: No results for input(s): CHOL, TRIG, HDL, CHOLHDL, VLDL, LDLCALC in the last 168 hours.  CBG:  Recent Labs Lab 08/14/15 2302 08/15/15 0742 08/16/15 0826 08/17/15 0809  GLUCAP 115* 91 95 99    Microbiology: Results for orders placed or performed during the hospital encounter of 08/14/15  Culture, blood (x 2)     Status: None (Preliminary result)   Collection Time: 08/15/15 12:26 AM  Result Value Ref Range Status   Specimen Description BLOOD RIGHT HAND  Final   Special Requests   Final    BOTTLES DRAWN AEROBIC AND ANAEROBIC 6CC BLUE 5CC PURPLE   Culture NO GROWTH 1 DAY  Final   Report Status PENDING  Incomplete  Culture, blood (x 2)  Status: None (Preliminary result)   Collection Time: 08/15/15 12:33 AM  Result Value Ref Range Status   Specimen Description BLOOD LEFT ARM  Final   Special Requests BOTTLES DRAWN AEROBIC AND ANAEROBIC 6CC  Final   Culture NO GROWTH 1 DAY  Final   Report Status PENDING  Incomplete    Coagulation Studies:  Recent Labs  08/15/15 0016 08/15/15 0218  LABPROT 15.4* 15.7*  INR 1.20 1.23    Imaging: No results found.  Medications: I have reviewed the patient's current medications.  Assessment/Plan: Acute encephalopathy due to polypharmacy in setting of acute renal failure - Reduced MS Contin 60 BID - Reduce Gabapentin 600 in AM, 600in afternoon, 1200 in PM - ADD Bacolofen 10mg  QHS for muscle spasms    Lucious Groves, DO IMTS PGY-3 Pager:  [phone] 08/17/2015, 11:33 AM

## 2015-08-17 NOTE — Progress Notes (Signed)
CRITICAL VALUE ALERT  Critical value received:  K 2.9  Date of notification:  08/17/15  Time of notification:  0810  Critical value read back:Yes.    Nurse who received alert:  Carole Civil, RN  MD notified (1st page):  Dr. Cruzita Lederer  Time of first page:  0815  Responding MD:  Dr. Cruzita Lederer  Time MD responded: [phone]

## 2015-08-20 LAB — CULTURE, BLOOD (ROUTINE X 2)
CULTURE: NO GROWTH
Culture: NO GROWTH

## 2015-08-24 ENCOUNTER — Encounter (HOSPITAL_COMMUNITY): Payer: Self-pay | Admitting: Neurology

## 2015-08-24 DIAGNOSIS — M62830 Muscle spasm of back: Secondary | ICD-10-CM | POA: Insufficient documentation

## 2015-08-24 DIAGNOSIS — M549 Dorsalgia, unspecified: Secondary | ICD-10-CM

## 2015-08-24 DIAGNOSIS — G8929 Other chronic pain: Secondary | ICD-10-CM | POA: Insufficient documentation

## 2015-08-24 DIAGNOSIS — G9340 Encephalopathy, unspecified: Secondary | ICD-10-CM | POA: Insufficient documentation

## 2015-09-26 ENCOUNTER — Inpatient Hospital Stay (HOSPITAL_COMMUNITY)
Admission: EM | Admit: 2015-09-26 | Discharge: 2015-10-02 | DRG: 871 | Disposition: A | Discharge status: Home Health | Payer: PPO | Attending: Internal Medicine | Admitting: Internal Medicine

## 2015-09-26 ENCOUNTER — Encounter (HOSPITAL_COMMUNITY): Payer: Self-pay | Admitting: Emergency Medicine

## 2015-09-26 ENCOUNTER — Inpatient Hospital Stay (HOSPITAL_COMMUNITY): Payer: PPO

## 2015-09-26 ENCOUNTER — Emergency Department (HOSPITAL_COMMUNITY): Payer: PPO

## 2015-09-26 DIAGNOSIS — K219 Gastro-esophageal reflux disease without esophagitis: Secondary | ICD-10-CM | POA: Diagnosis not present

## 2015-09-26 DIAGNOSIS — F419 Anxiety disorder, unspecified: Secondary | ICD-10-CM | POA: Diagnosis not present

## 2015-09-26 DIAGNOSIS — Z96653 Presence of artificial knee joint, bilateral: Secondary | ICD-10-CM | POA: Diagnosis not present

## 2015-09-26 DIAGNOSIS — I514 Myocarditis, unspecified: Secondary | ICD-10-CM | POA: Diagnosis not present

## 2015-09-26 DIAGNOSIS — J690 Pneumonitis due to inhalation of food and vomit: Secondary | ICD-10-CM | POA: Diagnosis present

## 2015-09-26 DIAGNOSIS — Z79891 Long term (current) use of opiate analgesic: Secondary | ICD-10-CM | POA: Diagnosis not present

## 2015-09-26 DIAGNOSIS — D649 Anemia, unspecified: Secondary | ICD-10-CM | POA: Diagnosis not present

## 2015-09-26 DIAGNOSIS — J9601 Acute respiratory failure with hypoxia: Secondary | ICD-10-CM | POA: Insufficient documentation

## 2015-09-26 DIAGNOSIS — R338 Other retention of urine: Secondary | ICD-10-CM | POA: Diagnosis not present

## 2015-09-26 DIAGNOSIS — Z79899 Other long term (current) drug therapy: Secondary | ICD-10-CM | POA: Diagnosis not present

## 2015-09-26 DIAGNOSIS — E876 Hypokalemia: Secondary | ICD-10-CM | POA: Diagnosis present

## 2015-09-26 DIAGNOSIS — I10 Essential (primary) hypertension: Secondary | ICD-10-CM | POA: Diagnosis not present

## 2015-09-26 DIAGNOSIS — I959 Hypotension, unspecified: Secondary | ICD-10-CM

## 2015-09-26 DIAGNOSIS — E875 Hyperkalemia: Secondary | ICD-10-CM | POA: Diagnosis present

## 2015-09-26 DIAGNOSIS — M549 Dorsalgia, unspecified: Secondary | ICD-10-CM | POA: Diagnosis present

## 2015-09-26 DIAGNOSIS — Z96643 Presence of artificial hip joint, bilateral: Secondary | ICD-10-CM | POA: Diagnosis present

## 2015-09-26 DIAGNOSIS — Z8711 Personal history of peptic ulcer disease: Secondary | ICD-10-CM

## 2015-09-26 DIAGNOSIS — F17200 Nicotine dependence, unspecified, uncomplicated: Secondary | ICD-10-CM | POA: Diagnosis not present

## 2015-09-26 DIAGNOSIS — M109 Gout, unspecified: Secondary | ICD-10-CM | POA: Diagnosis not present

## 2015-09-26 DIAGNOSIS — T40604D Poisoning by unspecified narcotics, undetermined, subsequent encounter: Secondary | ICD-10-CM | POA: Diagnosis not present

## 2015-09-26 DIAGNOSIS — K5900 Constipation, unspecified: Secondary | ICD-10-CM | POA: Diagnosis not present

## 2015-09-26 DIAGNOSIS — R451 Restlessness and agitation: Secondary | ICD-10-CM | POA: Diagnosis present

## 2015-09-26 DIAGNOSIS — F329 Major depressive disorder, single episode, unspecified: Secondary | ICD-10-CM | POA: Diagnosis present

## 2015-09-26 DIAGNOSIS — N179 Acute kidney failure, unspecified: Secondary | ICD-10-CM | POA: Diagnosis present

## 2015-09-26 DIAGNOSIS — N401 Enlarged prostate with lower urinary tract symptoms: Secondary | ICD-10-CM | POA: Diagnosis present

## 2015-09-26 DIAGNOSIS — T40601A Poisoning by unspecified narcotics, accidental (unintentional), initial encounter: Secondary | ICD-10-CM | POA: Diagnosis present

## 2015-09-26 DIAGNOSIS — Z96612 Presence of left artificial shoulder joint: Secondary | ICD-10-CM | POA: Diagnosis present

## 2015-09-26 DIAGNOSIS — K920 Hematemesis: Secondary | ICD-10-CM | POA: Insufficient documentation

## 2015-09-26 DIAGNOSIS — T402X1A Poisoning by other opioids, accidental (unintentional), initial encounter: Secondary | ICD-10-CM | POA: Diagnosis present

## 2015-09-26 DIAGNOSIS — G894 Chronic pain syndrome: Secondary | ICD-10-CM | POA: Diagnosis not present

## 2015-09-26 DIAGNOSIS — G629 Polyneuropathy, unspecified: Secondary | ICD-10-CM | POA: Diagnosis present

## 2015-09-26 DIAGNOSIS — E872 Acidosis: Secondary | ICD-10-CM | POA: Diagnosis not present

## 2015-09-26 DIAGNOSIS — A4901 Methicillin susceptible Staphylococcus aureus infection, unspecified site: Secondary | ICD-10-CM | POA: Diagnosis not present

## 2015-09-26 DIAGNOSIS — E860 Dehydration: Secondary | ICD-10-CM | POA: Diagnosis present

## 2015-09-26 DIAGNOSIS — G9340 Encephalopathy, unspecified: Secondary | ICD-10-CM | POA: Diagnosis present

## 2015-09-26 DIAGNOSIS — J9690 Respiratory failure, unspecified, unspecified whether with hypoxia or hypercapnia: Secondary | ICD-10-CM | POA: Diagnosis present

## 2015-09-26 DIAGNOSIS — T80219A Unspecified infection due to central venous catheter, initial encounter: Secondary | ICD-10-CM

## 2015-09-26 DIAGNOSIS — T40601D Poisoning by unspecified narcotics, accidental (unintentional), subsequent encounter: Secondary | ICD-10-CM | POA: Diagnosis not present

## 2015-09-26 DIAGNOSIS — A4101 Sepsis due to Methicillin susceptible Staphylococcus aureus: Secondary | ICD-10-CM | POA: Diagnosis not present

## 2015-09-26 DIAGNOSIS — R197 Diarrhea, unspecified: Secondary | ICD-10-CM | POA: Diagnosis not present

## 2015-09-26 LAB — COMPREHENSIVE METABOLIC PANEL
ALK PHOS: 87 U/L (ref 38–126)
ALT: 11 U/L — ABNORMAL LOW (ref 17–63)
ALT: 21 U/L (ref 17–63)
ANION GAP: 14 (ref 5–15)
AST: 22 U/L (ref 15–41)
AST: 41 U/L (ref 15–41)
Albumin: 3.3 g/dL — ABNORMAL LOW (ref 3.5–5.0)
Albumin: 2.5 g/dL — ABNORMAL LOW (ref 3.5–5.0)
Alkaline Phosphatase: 111 U/L (ref 38–126)
Anion gap: 8 (ref 5–15)
BILIRUBIN TOTAL: 0.8 mg/dL (ref 0.3–1.2)
BUN: 42 mg/dL — ABNORMAL HIGH (ref 6–20)
BUN: 37 mg/dL — AB (ref 6–20)
CALCIUM: 7.4 mg/dL — AB (ref 8.9–10.3)
CHLORIDE: 97 mmol/L — AB (ref 101–111)
CHLORIDE: 107 mmol/L (ref 101–111)
CO2: 22 mmol/L (ref 22–32)
CO2: 20 mmol/L — ABNORMAL LOW (ref 22–32)
CREATININE: 2.05 mg/dL — AB (ref 0.61–1.24)
Calcium: 8.8 mg/dL — ABNORMAL LOW (ref 8.9–10.3)
Creatinine, Ser: 2.97 mg/dL — ABNORMAL HIGH (ref 0.61–1.24)
GFR calc Af Amer: 37 mL/min — ABNORMAL LOW (ref >60)
GFR calc non Af Amer: 20 mL/min — ABNORMAL LOW (ref >60)
GFR, EST AFRICAN AMERICAN: 24 mL/min — AB (ref >60)
GFR, EST NON AFRICAN AMERICAN: 32 mL/min — AB (ref >60)
Glucose, Bld: 132 mg/dL — ABNORMAL HIGH (ref 65–99)
Glucose, Bld: 152 mg/dL — ABNORMAL HIGH (ref 65–99)
Potassium: 5.6 mmol/L — ABNORMAL HIGH (ref 3.5–5.1)
Potassium: 5.8 mmol/L — ABNORMAL HIGH (ref 3.5–5.1)
SODIUM: 133 mmol/L — AB (ref 135–145)
Sodium: 135 mmol/L (ref 135–145)
TOTAL PROTEIN: 5.2 g/dL — AB (ref 6.5–8.1)
Total Bilirubin: 0.6 mg/dL (ref 0.3–1.2)
Total Protein: 6.7 g/dL (ref 6.5–8.1)

## 2015-09-26 LAB — I-STAT ARTERIAL BLOOD GAS, ED
ACID-BASE DEFICIT: 5 mmol/L — AB (ref 0.0–2.0)
ACID-BASE DEFICIT: 11 mmol/L — AB (ref 0.0–2.0)
BICARBONATE: 23 meq/L (ref 20.0–24.0)
BICARBONATE: 15.3 meq/L — AB (ref 20.0–24.0)
O2 SAT: 90 %
O2 SAT: 95 %
PH ART: 7.254 — AB (ref 7.350–7.450)
PO2 ART: 68 mmHg — AB (ref 80.0–100.0)
PO2 ART: 86 mmHg (ref 80.0–100.0)
Patient temperature: 98.6
Patient temperature: 98.6
TCO2: 25 mmol/L (ref 0–100)
TCO2: 16 mmol/L (ref 0–100)
pCO2 arterial: 51.9 mmHg — ABNORMAL HIGH (ref 35.0–45.0)
pCO2 arterial: 33.8 mmHg — ABNORMAL LOW (ref 35.0–45.0)
pH, Arterial: 7.264 — ABNORMAL LOW (ref 7.350–7.450)

## 2015-09-26 LAB — URINALYSIS, ROUTINE W REFLEX MICROSCOPIC
BILIRUBIN URINE: NEGATIVE
GLUCOSE, UA: NEGATIVE mg/dL
Hgb urine dipstick: NEGATIVE
KETONES UR: NEGATIVE mg/dL
LEUKOCYTES UA: NEGATIVE
Nitrite: NEGATIVE
PH: 5 (ref 5.0–8.0)
Protein, ur: NEGATIVE mg/dL
Specific Gravity, Urine: 1.015 (ref 1.005–1.030)

## 2015-09-26 LAB — RAPID URINE DRUG SCREEN, HOSP PERFORMED
Amphetamines: NOT DETECTED
BARBITURATES: NOT DETECTED
BENZODIAZEPINES: POSITIVE — AB
COCAINE: NOT DETECTED
Opiates: POSITIVE — AB
Tetrahydrocannabinol: NOT DETECTED

## 2015-09-26 LAB — CBC
HEMATOCRIT: 30.5 % — AB (ref 39.0–52.0)
HEMOGLOBIN: 9.6 g/dL — AB (ref 13.0–17.0)
MCH: 27 pg (ref 26.0–34.0)
MCHC: 31.5 g/dL (ref 30.0–36.0)
MCV: 85.9 fL (ref 78.0–100.0)
Platelets: 327 10*3/uL (ref 150–400)
RBC: 3.55 MIL/uL — ABNORMAL LOW (ref 4.22–5.81)
RDW: 14.3 % (ref 11.5–15.5)
WBC: 13.8 10*3/uL — ABNORMAL HIGH (ref 4.0–10.5)

## 2015-09-26 LAB — CBC WITH DIFFERENTIAL/PLATELET
Basophils Absolute: 0 10*3/uL (ref 0.0–0.1)
Basophils Relative: 0 %
EOS ABS: 0 10*3/uL (ref 0.0–0.7)
EOS PCT: 0 %
HCT: 34.5 % — ABNORMAL LOW (ref 39.0–52.0)
Hemoglobin: 10.7 g/dL — ABNORMAL LOW (ref 13.0–17.0)
LYMPHS ABS: 0.4 10*3/uL — AB (ref 0.7–4.0)
Lymphocytes Relative: 3 %
MCH: 27.3 pg (ref 26.0–34.0)
MCHC: 31 g/dL (ref 30.0–36.0)
MCV: 88 fL (ref 78.0–100.0)
MONOS PCT: 6 %
Monocytes Absolute: 0.7 10*3/uL (ref 0.1–1.0)
Neutro Abs: 11.4 10*3/uL — ABNORMAL HIGH (ref 1.7–7.7)
Neutrophils Relative %: 91 %
PLATELETS: 333 10*3/uL (ref 150–400)
RBC: 3.92 MIL/uL — ABNORMAL LOW (ref 4.22–5.81)
RDW: 14.4 % (ref 11.5–15.5)
WBC: 12.5 10*3/uL — AB (ref 4.0–10.5)

## 2015-09-26 LAB — CORTISOL: CORTISOL PLASMA: 14.2 ug/dL

## 2015-09-26 LAB — APTT: aPTT: 30 seconds (ref 24–37)

## 2015-09-26 LAB — MAGNESIUM: MAGNESIUM: 1.5 mg/dL — AB (ref 1.7–2.4)

## 2015-09-26 LAB — TYPE AND SCREEN
ABO/RH(D): O POS
ANTIBODY SCREEN: NEGATIVE

## 2015-09-26 LAB — GLUCOSE, CAPILLARY: GLUCOSE-CAPILLARY: 107 mg/dL — AB (ref 65–99)

## 2015-09-26 LAB — MRSA PCR SCREENING: MRSA by PCR: NEGATIVE

## 2015-09-26 LAB — PROCALCITONIN: Procalcitonin: 5.92 ng/mL

## 2015-09-26 LAB — LACTIC ACID, PLASMA: Lactic Acid, Venous: 2 mmol/L (ref 0.5–2.0)

## 2015-09-26 LAB — PROTIME-INR
INR: 1.17 (ref 0.00–1.49)
Prothrombin Time: 15.1 seconds (ref 11.6–15.2)

## 2015-09-26 LAB — ETHANOL: Alcohol, Ethyl (B): <5 mg/dL (ref <5)

## 2015-09-26 LAB — PHOSPHORUS: Phosphorus: 3.5 mg/dL (ref 2.5–4.6)

## 2015-09-26 LAB — AMMONIA: AMMONIA: 35 umol/L (ref 9–35)

## 2015-09-26 LAB — I-STAT CG4 LACTIC ACID, ED: LACTIC ACID, VENOUS: 3.44 mmol/L — AB (ref 0.5–2.0)

## 2015-09-26 LAB — CBG MONITORING, ED: GLUCOSE-CAPILLARY: 100 mg/dL — AB (ref 65–99)

## 2015-09-26 LAB — ABO/RH: ABO/RH(D): O POS

## 2015-09-26 MED ORDER — CLINDAMYCIN PHOSPHATE 900 MG/50ML IV SOLN: 900.0000 mg | Freq: Once | INTRAVENOUS | Status: DC

## 2015-09-26 MED ORDER — VANCOMYCIN HCL IN DEXTROSE 1-5 GM/200ML-% IV SOLN
1000.0000 mg | INTRAVENOUS | Status: DC
  Administered 2015-09-27: 1000 mg via INTRAVENOUS
  Filled 2015-09-26 (×2): qty 200

## 2015-09-26 MED ORDER — PIPERACILLIN-TAZOBACTAM 3.375 G IVPB
3.3750 g | Freq: Three times a day (TID) | INTRAVENOUS | Status: DC
  Administered 2015-09-26 – 2015-09-28 (×6): 3.375 g via INTRAVENOUS
  Filled 2015-09-26 (×7): qty 50

## 2015-09-26 MED ORDER — HYDROMORPHONE HCL 1 MG/ML IJ SOLN
1.0000 mg | Freq: Once | INTRAMUSCULAR | Status: AC
  Administered 2015-09-26: 1 mg via INTRAVENOUS

## 2015-09-26 MED ORDER — PANTOPRAZOLE SODIUM 40 MG IV SOLR: 40.0000 mg | Freq: Every day | INTRAVENOUS | Status: DC

## 2015-09-26 MED ORDER — FENTANYL CITRATE (PF) 100 MCG/2ML IJ SOLN
INTRAMUSCULAR | Status: AC
  Filled 2015-09-26: qty 2

## 2015-09-26 MED ORDER — DEXTROSE 5 % IV SOLN: 1.0000 g | Freq: Once | INTRAVENOUS | Status: DC

## 2015-09-26 MED ORDER — ONDANSETRON HCL 4 MG/2ML IJ SOLN
INTRAMUSCULAR | Status: AC
  Administered 2015-09-26: 4 mg
  Filled 2015-09-26: qty 2

## 2015-09-26 MED ORDER — SODIUM CHLORIDE 0.9 % IV BOLUS (SEPSIS)
1000.0000 mL | Freq: Once | INTRAVENOUS | Status: AC
  Administered 2015-09-26: 1000 mL via INTRAVENOUS

## 2015-09-26 MED ORDER — FENTANYL CITRATE (PF) 100 MCG/2ML IJ SOLN
50.0000 ug | INTRAMUSCULAR | Status: DC | PRN
  Administered 2015-09-27 (×3): 100 ug via INTRAVENOUS
  Filled 2015-09-26 (×3): qty 2

## 2015-09-26 MED ORDER — NALOXONE HCL 2 MG/2ML IJ SOSY
1.0000 mg | PREFILLED_SYRINGE | Freq: Once | INTRAMUSCULAR | Status: AC
  Administered 2015-09-26: 1 mg via INTRAVENOUS
  Filled 2015-09-26: qty 2

## 2015-09-26 MED ORDER — SODIUM CHLORIDE 0.9 % IV SOLN
INTRAVENOUS | Status: DC
  Administered 2015-09-26: 14:00:00 via INTRAVENOUS
  Administered 2015-09-28: 50 mL/h via INTRAVENOUS
  Administered 2015-09-29: 01:00:00 via INTRAVENOUS

## 2015-09-26 MED ORDER — FENTANYL CITRATE (PF) 100 MCG/2ML IJ SOLN
INTRAMUSCULAR | Status: DC | PRN
  Administered 2015-09-26 (×2): 100 ug via INTRAVENOUS

## 2015-09-26 MED ORDER — BACLOFEN 10 MG PO TABS: 10.0000 mg | ORAL_TABLET | Freq: Every day | ORAL | Status: DC

## 2015-09-26 MED ORDER — SODIUM CHLORIDE 0.9 % IV BOLUS (SEPSIS)
10.0000 mL/kg | Freq: Once | INTRAVENOUS | Status: AC
  Administered 2015-09-26: 846 mL via INTRAVENOUS

## 2015-09-26 MED ORDER — PIPERACILLIN-TAZOBACTAM 3.375 G IVPB 30 MIN
3.3750 g | Freq: Once | INTRAVENOUS | Status: AC
  Administered 2015-09-26: 3.375 g via INTRAVENOUS
  Filled 2015-09-26: qty 50

## 2015-09-26 MED ORDER — MIDAZOLAM HCL 2 MG/2ML IJ SOLN
INTRAMUSCULAR | Status: AC
  Filled 2015-09-26: qty 4

## 2015-09-26 MED ORDER — ETOMIDATE 2 MG/ML IV SOLN
INTRAVENOUS | Status: DC | PRN
  Administered 2015-09-26 (×2): 20 mg via INTRAVENOUS

## 2015-09-26 MED ORDER — NOREPINEPHRINE BITARTRATE 1 MG/ML IV SOLN
2.0000 ug/min | INTRAVENOUS | Status: DC
  Administered 2015-09-26: 5 ug/min via INTRAVENOUS
  Filled 2015-09-26 (×2): qty 4

## 2015-09-26 MED ORDER — VANCOMYCIN HCL 10 G IV SOLR
1250.0000 mg | Freq: Once | INTRAVENOUS | Status: AC
  Administered 2015-09-26: 1250 mg via INTRAVENOUS
  Filled 2015-09-26: qty 1250

## 2015-09-26 MED ORDER — SODIUM CHLORIDE 0.9 % IV BOLUS (SEPSIS)
20.0000 mL/kg | Freq: Once | INTRAVENOUS | Status: AC
  Administered 2015-09-26: 1692 mL via INTRAVENOUS

## 2015-09-26 MED ORDER — FENTANYL CITRATE (PF) 100 MCG/2ML IJ SOLN
INTRAMUSCULAR | Status: AC
  Administered 2015-09-26: 100 ug via INTRAVENOUS
  Filled 2015-09-26: qty 2

## 2015-09-26 MED ORDER — MIDAZOLAM HCL 2 MG/2ML IJ SOLN
1.0000 mg | INTRAMUSCULAR | Status: DC | PRN
  Administered 2015-09-27 (×5): 1 mg via INTRAVENOUS
  Filled 2015-09-26 (×4): qty 2

## 2015-09-26 MED ORDER — PANTOPRAZOLE SODIUM 40 MG IV SOLR
40.0000 mg | Freq: Two times a day (BID) | INTRAVENOUS | Status: DC
  Administered 2015-09-26 – 2015-09-28 (×6): 40 mg via INTRAVENOUS
  Filled 2015-09-26 (×7): qty 40

## 2015-09-26 MED ORDER — MIDAZOLAM HCL 5 MG/5ML IJ SOLN
INTRAMUSCULAR | Status: DC | PRN
  Administered 2015-09-26: 4 mg via INTRAVENOUS

## 2015-09-26 MED ORDER — HYDROMORPHONE HCL 1 MG/ML IJ SOLN: 0.5000 mg | INTRAMUSCULAR | Status: DC | PRN

## 2015-09-26 MED ORDER — HYDROMORPHONE HCL 1 MG/ML IJ SOLN
INTRAMUSCULAR | Status: AC
  Filled 2015-09-26: qty 1

## 2015-09-26 MED ORDER — MIDAZOLAM HCL 2 MG/2ML IJ SOLN
4.0000 mg | INTRAMUSCULAR | Status: DC | PRN
  Administered 2015-09-26: 4 mg via INTRAVENOUS

## 2015-09-26 MED ORDER — DEXTROSE 5 % IV SOLN: 500.0000 mg | Freq: Once | INTRAVENOUS | Status: DC

## 2015-09-26 MED ORDER — SODIUM CHLORIDE 0.9 % IV BOLUS (SEPSIS)
750.0000 mL | Freq: Once | INTRAVENOUS | Status: AC
  Administered 2015-09-26: 750 mL via INTRAVENOUS

## 2015-09-26 MED ORDER — BACLOFEN 10 MG PO TABS
10.0000 mg | ORAL_TABLET | Freq: Every day | ORAL | Status: DC
  Administered 2015-09-26: 10 mg
  Filled 2015-09-26 (×3): qty 1

## 2015-09-26 MED ORDER — ACETAMINOPHEN 160 MG/5ML PO SOLN
650.0000 mg | Freq: Four times a day (QID) | ORAL | Status: DC | PRN
  Administered 2015-09-26: 650 mg
  Filled 2015-09-26: qty 20.3

## 2015-09-26 MED ORDER — TAMSULOSIN HCL 0.4 MG PO CAPS
0.4000 mg | ORAL_CAPSULE | Freq: Every day | ORAL | Status: DC
  Administered 2015-09-27 – 2015-10-02 (×6): 0.4 mg via ORAL
  Filled 2015-09-26 (×6): qty 1

## 2015-09-26 MED ORDER — SUCCINYLCHOLINE CHLORIDE 20 MG/ML IJ SOLN
INTRAMUSCULAR | Status: DC | PRN
  Administered 2015-09-26 (×2): 100 mg via INTRAVENOUS

## 2015-09-26 NOTE — Progress Notes (Signed)
Patient transported to room 2M08 from ED without any apparent complications.

## 2015-09-26 NOTE — ED Notes (Signed)
Pt CBG, 100.

## 2015-09-26 NOTE — Code Documentation (Signed)
Family updated as to patient's status.

## 2015-09-26 NOTE — Progress Notes (Signed)
ANTIBIOTIC CONSULT NOTE - INITIAL  Pharmacy Consult for Zosyn and vancomycin Indication: HAP  No Known Allergies  Patient Measurements: Weight: 186 lb 8.2 oz (84.6 kg)  Vital Signs: Temp: 98.6 F (37 C) (01/11 1015) Temp Source: Rectal (01/11 1015) BP: 83/64 mmHg (01/11 1130) Pulse Rate: 88 (01/11 1130) Intake/Output from previous day:   Intake/Output from this shift:    Labs:  Recent Labs  09/26/15 0917  WBC 12.5*  HGB 10.7*  PLT 333  CREATININE 2.97*   Estimated Creatinine Clearance: 24.9 mL/min (by C-G formula based on Cr of 2.97). No results for input(s): VANCOTROUGH, VANCOPEAK, VANCORANDOM, GENTTROUGH, GENTPEAK, GENTRANDOM, TOBRATROUGH, TOBRAPEAK, TOBRARND, AMIKACINPEAK, AMIKACINTROU, AMIKACIN in the last 72 hours.   Microbiology: No results found for this or any previous visit (from the past 720 hour(s)).  Medical History: Past Medical History  Diagnosis Date  . Hypertension   . Anxiety     takes Celexa  . Enlarged prostate   . Neuropathy ([place])   . Arthritis   . History of blood transfusion   . Chronic back pain     Assessment: 70 yo M presents on 1/11 for AMS. Given vanc and zosyn in the ED. Pharmacy consulted to dose abx for HAP. Afebrile, WBC 12.5. SCr elevated at 2.97, CrCl ~63ml/min.  Goal of Therapy:  Vancomycin trough level 15-20 mcg/ml  Resolution of infection  Plan:  Continue Zosyn 3.375 gm IV q8h (4 hour infusion) Start vancomycin 1g IV Q24 Monitor clinical picture, renal function, VT prn F/U C&S, abx deescalation / LOT  Elenor Quinones, PharmD, BCPS Clinical Pharmacist Pager [phone] 09/26/2015 12:50 PM

## 2015-09-26 NOTE — Progress Notes (Signed)
Tube exchanged due to having a hole in the balloon. 8.0 tube, 25 at teeth. Xray called to check tube placement. RT will continue to monitor.

## 2015-09-26 NOTE — Progress Notes (Signed)
Responded to page from [place] to provide support to patient who had presented to  ED after possibly oversedating on pain medication. Patient is currently intubated and going to ICU -21M. Patient is supported by wife, son, in-laws ,brother and sister. Family was updated by Dr. Ronald Pippins .  I provided ministry of presence and emotional support.  Will pass on to unit Chaplain for continued support.   09/26/15 1100  Clinical Encounter Type  Visited With Patient;Family;Patient and family together;Health care provider  Visit Type Initial;Spiritual support;ED;Trauma  Referral From Physician  Spiritual Encounters  Spiritual Needs Emotional  Stress Factors  Family Stress Factors Health changes  Jaclynn Major [place], [place]

## 2015-09-26 NOTE — Progress Notes (Signed)
eLink Physician-Brief Progress Note Patient Name: William Jordan DOB: [birth year] MRN: [id number]   Date of Service  09/26/2015  HPI/Events of Note  Multiple issues: 1. Fever to 102.3 F. Patient pan cultured today and started on Vancomycin and Zosyn. 2. CVP = 20.  eICU Interventions  Will order: 1. Tylenol Liquid 650 mg per tube Q 6 hours PRN Fever > 101.5 F. 2. Decrease IV fluid to 50 mL/hour.      Intervention Category Major Interventions: Infection - evaluation and management;Other:  Lysle Dingwall 09/26/2015, 9:53 PM

## 2015-09-26 NOTE — Code Documentation (Signed)
Vital signs stable. 

## 2015-09-26 NOTE — ED Provider Notes (Signed)
INTUBATION Performed by: Daralene Milch, MD  Required items: required blood products, implants, devices, and special equipment available Patient identity confirmed: provided demographic data and hospital-assigned identification number Time out: Immediately prior to procedure a "time out" was called to verify the correct patient, procedure, equipment, support staff and site/side marked as required.  Indications: aspiration pna, respiratory distress  Intubation method: Glidescope Laryngoscopy   Preoxygenation: BVM  Sedatives: Propofol Paralytic: Succinylcholine  Tube Size: 8 cuffed  Post-procedure assessment: chest rise and ETCO2 monitor Breath sounds: equal and absent over the epigastrium Tube secured with: ETT holder Chest x-ray interpreted by radiologist and me.  Chest x-ray findings: endotracheal tube in appropriate position  Patient tolerated the procedure well with no immediate complications.    Domenic Moras, PA-C 09/26/15 [place], MD 09/26/15 [phone]

## 2015-09-26 NOTE — Procedures (Signed)
Central Venous Catheter Insertion Procedure Note William Jordan [id number] [birth year]  Procedure: Insertion of Central Venous Catheter Indications: Assessment of intravascular volume, Drug and/or fluid administration and Frequent blood sampling  Procedure Details Consent: Unable to obtain consent because of emergent medical necessity. Time Out: Verified patient identification, verified procedure, site/side was marked, verified correct patient position, special equipment/implants available, medications/allergies/relevent history reviewed, required imaging and test results available.  Performed  Maximum sterile technique was used including antiseptics, cap, gloves, gown, hand hygiene, mask and sheet. Skin prep: Chlorhexidine; local anesthetic administered A antimicrobial bonded/coated triple lumen catheter was placed in the left internal jugular vein using the Seldinger technique. Ultrasound guidance used.Yes.   Catheter placed to 20 cm. Blood aspirated via all 3 ports and then flushed x 3. Line sutured x 2 and dressing applied.  Evaluation Blood flow good Complications: No apparent complications Patient did tolerate procedure well. Chest X-ray ordered to verify placement.  CXR: pending.  Richardson Landry Minor ACNP Maryanna Shape PCCM Pager [phone] till 3 pm If no answer page [phone] 09/26/2015, 12:23 PM

## 2015-09-26 NOTE — H&P (Signed)
PULMONARY / CRITICAL CARE MEDICINE   Name: William Jordan MRN: [id number] DOB: 14-Jan-1946    ADMISSION DATE:  09/26/2015   REFERRING MD: EDDP  CHIEF COMPLAINT: AMS  HISTORY OF PRESENT ILLNESS:   70 yo with chronic pain from DJD/Gout om extended release ms and was found down with bloody vomitus by wife. She started CPR but he woke up. EMS was called and was transported to ER. Reintubated due to blown cuff and had witnessed aspiration. PCCM called to admit. He is hypotensive and is proving refractory to IVF. CVL will be placed and pressors started.  PAST MEDICAL HISTORY :  He  has a past medical history of Hypertension; Anxiety; Enlarged prostate; Neuropathy ([place]); Arthritis; History of blood transfusion; and Chronic back pain.  PAST SURGICAL HISTORY: He  has past surgical history that includes Joint replacement (Left); Joint replacement (Right); Joint replacement (Left); Elbow surgery (Right); Hernia repair; Tonsillectomy; Vasectomy; Colonoscopy; and Spinal cord stimulator insertion (N/A, 09/28/2014).  No Known Allergies  No current facility-administered medications on file prior to encounter.   Current Outpatient Prescriptions on File Prior to Encounter  Medication Sig  . amLODipine (NORVASC) 10 MG tablet Take 1 tablet (10 mg total) by mouth daily.  . baclofen (LIORESAL) 10 MG tablet Take 1 tablet (10 mg total) by mouth at bedtime.  . citalopram (CELEXA) 20 MG tablet Take 20 mg by mouth daily.  . metoprolol (LOPRESSOR) 50 MG tablet Take 1 tablet (50 mg total) by mouth 2 (two) times daily.  . tamsulosin (FLOMAX) 0.4 MG CAPS capsule Take 0.4 mg by mouth daily.  . vitamin B-12 (CYANOCOBALAMIN) 100 MCG tablet Take 1 tablet (100 mcg total) by mouth daily.  Marland Kitchen docusate sodium (COLACE) 100 MG capsule Take 1 capsule (100 mg total) by mouth 3 (three) times daily as needed for mild constipation.  . gabapentin (NEURONTIN) 600 MG tablet Take 1 tablet (600 mg total) by mouth 2 (two) times daily.  Morning and Lunch  . gabapentin (NEURONTIN) 600 MG tablet Take 2 tablets (1,200 mg total) by mouth at bedtime.  Marland Kitchen levofloxacin (LEVAQUIN) 500 MG tablet Take 1 tablet (500 mg total) by mouth daily. (Patient not taking: Reported on 09/26/2015)  . morphine (MS CONTIN) 60 MG 12 hr tablet Take 1 tablet (60 mg total) by mouth every 12 (twelve) hours.  . polyethylene glycol (MIRALAX / GLYCOLAX) packet Take 17 g by mouth daily. (Patient taking differently: Take 17 g by mouth daily as needed for mild constipation or moderate constipation. )    FAMILY HISTORY:  His indicated that his mother is deceased. He indicated that his father is deceased.   SOCIAL HISTORY: He  reports that he has been smoking E-cigarettes.  He has quit using smokeless tobacco. His smokeless tobacco use included Snuff. He reports that he does not drink alcohol or use illicit drugs.  REVIEW OF SYSTEMS:   NA  SUBJECTIVE:  Sedated on vent  VITAL SIGNS: BP 93/63 mmHg  Pulse 90  Temp(Src) 98.6 F (37 C) (Rectal)  Resp 16  Wt 186 lb 8.2 oz (84.6 kg)  SpO2 100%  HEMODYNAMICS:    VENTILATOR SETTINGS: Vent Mode:  [-] PRVC FiO2 (%):  [80 %] 80 % Set Rate:  [16 bmp] 16 bmp Vt Set:  [600 mL] 600 mL PEEP:  [5 cmH20] 5 cmH20 Plateau Pressure:  [17 cmH20] 17 cmH20  INTAKE / OUTPUT:    PHYSICAL EXAMINATION: General: Obtunded wm Neuro: Sedated and NMB on board HEENT:  Ott-> vent  Cardiovascular: HSR RRR Lungs: coarse rhonchi bilat Abdomen: soft + bs Musculoskeletal:  intact Skin:  cool  LABS:  BMET  Recent Labs Lab 09/26/15 0917  NA 133*  K 5.6*  CL 97*  CO2 22  BUN 42*  CREATININE 2.97*  GLUCOSE 132*    Electrolytes  Recent Labs Lab 09/26/15 0917  CALCIUM 8.8*    CBC  Recent Labs Lab 09/26/15 0917  WBC 12.5*  HGB 10.7*  HCT 34.5*  PLT 333    Coag's No results for input(s): APTT, INR in the last 168 hours.  Sepsis Markers  Recent Labs Lab 09/26/15 0933  LATICACIDVEN 3.44*     ABG  Recent Labs Lab 09/26/15 1010  PHART 7.254*  PCO2ART 51.9*  PO2ART 68.0*    Liver Enzymes  Recent Labs Lab 09/26/15 0917  AST 22  ALT 11*  ALKPHOS 111  BILITOT 0.6  ALBUMIN 3.3*    Cardiac Enzymes No results for input(s): TROPONINI, PROBNP in the last 168 hours.  Glucose  Recent Labs Lab 09/26/15 1002  GLUCAP 100*    Imaging Dg Chest Portable 1 View  09/26/2015  CLINICAL DATA:  Intubation, altered mental status, hypertension, smoker EXAM: PORTABLE CHEST 1 VIEW COMPARISON:  Portable exam 1047 hours compared to 09/26/2015 FINDINGS: Tip of endotracheal tube projects 2.5 cm above carina. Nasogastric tube coiled in stomach. Spinal stimulator leads project over mid chest. Minimal enlargement of cardiac silhouette. Atherosclerotic calcification aorta. Low lung volumes with LEFT perihilar and lower lobe infiltrate, questionably involving lingula as well. Mild RIGHT basilar atelectasis. No gross pleural effusion or pneumothorax. Bones demineralized with note of old RIGHT clavicular fracture and LEFT shoulder arthroplasty. IMPRESSION: Persistent LEFT perihilar and lower lobe infiltrates question pneumonia. Minimal RIGHT basilar atelectasis. Low lung volumes. Electronically Signed   By: Lavonia Dana M.D.   On: 09/26/2015 11:02   Dg Chest Portable 1 View  09/26/2015  CLINICAL DATA:  Hypoxia.  Altered mental status. EXAM: PORTABLE CHEST 1 VIEW COMPARISON:  PA and lateral chest 08/14/2015 and 05/15/2015. CT chest 06/08/2015. FINDINGS: Patchy airspace disease is seen in the lingula. The right lung is clear. Heart size is normal. No pneumothorax or pleural effusion. Spinal stimulator and left shoulder replacement are noted. The patient has remote right clavicle fracture. IMPRESSION: Patchy airspace disease in the lingula worrisome for pneumonia. Electronically Signed   By: Inge Rise M.D.   On: 09/26/2015 09:40     STUDIES:    CULTURES: 1/99 bc x 2>> 1/11 uc>> 1/11  sputum>>  ANTIBIOTICS: 1/11 unasyn>>  SIGNIFICANT EVENTS: 1/11 found down  LINES/TUBES: 1/11 ett>> 11/11 cvl>>  DISCUSSION: 70 yo od on vent  ASSESSMENT / PLAN:  PULMONARY A: VDRF secondary to narcotics Aspirations  P:   Vent bundle 'BD's Abx  CARDIOVASCULAR A:  Hypotension   P:  Hold antihypertensives Fluid resuscitation  Allow narcotics to clear system  RENAL A:   Acute renal failure, most likely dehydration  P:   IVF CVL for CVP Avoid nephrotoxins   GASTROINTESTINAL A:   GERD Bloody emesis P:   PPI q 12h May need GI in future  HEMATOLOGIC A:   Anemia Bloody emesis  P:  Follow cbc Hold heparin   INFECTIOUS A:   Witnessed aspiration of vomitus and blood  P:   1/11 Unasyn  ENDOCRINE A:   No acute issue P:     NEUROLOGIC A:   Obtunded secondary to narcotics. No falls or trauma Chronic pain from DJD/Gout P:  RASS goal:0 Limited narcotics Allow narcotics to clear system  FAMILY  - Updates: Family updated per Dr. Halford Chessman  - Inter-disciplinary family meet or Palliative Care meeting due by:  day 7    Pulmonary and [place] Pager: [phone]  09/26/2015, 11:39 AM

## 2015-09-26 NOTE — ED Notes (Signed)
From home via EMS, altered - apparent overdose, responded to Narcan, sats low, other vitals stable

## 2015-09-26 NOTE — ED Provider Notes (Signed)
CSN: [id number]     Arrival date & time 09/26/15  0907 History   First MD Initiated Contact with Patient 09/26/15 0914     Chief Complaint  Patient presents with  . Altered Mental Status  . Drug Overdose    Level V caveat: Altered mental status  HPI Patient is brought to the emergency department from home by EMS.  The initial call was for cardiac arrest.  Family reports that they found him on the couch with dark vomit around his mouth and unresponsive.  Wife began CPR but reports when she was doing CPR he began moaning and moving.  On EMS arrival the patient was minimally responsive but did have a pulse.  He was given Narcan as he does have a history of chronic pain for which she is on extended release morphine.  Family reports that he does not typically take extra medication but sometimes he falls asleep and they believe that he forgets that he talk his medicine.  They report they doubt any intentional self-harm was involved.  Family reports prior alcohol abuse but reports no longer drinking.  Family reports he was slightly nauseated yesterday without vomiting and stated that he did not feel well.  He was hospitalized in December for healthcare associated pneumonia and acute encephalopathy which resolved with antibiotics and admission to the hospital.  No reports of recent fever.  No reports of recent decreased oral intake.  No recent trauma or falls.  EMS gave Narcan and they state he does have some improvement in his mental status.  No vomiting in route.  Patient was given Zofran by EMS.  On arrival to the emergency department he is arousable to voice and tactile stimuli only.    Past Medical History  Diagnosis Date  . Hypertension   . Anxiety     takes Celexa  . Enlarged prostate   . Neuropathy ([place])   . Arthritis   . History of blood transfusion   . Chronic back pain    Past Surgical History  Procedure Laterality Date  . Joint replacement Left     knee, x 2  . Joint replacement  Right     hip  . Joint replacement Left     partial shoulder  . Elbow surgery Right   . Hernia repair      umbilical  . Tonsillectomy    . Vasectomy    . Colonoscopy      one polyp removed - benign  . Spinal cord stimulator insertion N/A 09/28/2014    Procedure: LUMBAR SPINAL CORD STIMULATOR INSERTION;  Surgeon: Melina Schools, MD;  Location: [place];  Service: Orthopedics;  Laterality: N/A;   Family History  Problem Relation Age of Onset  . Heart disease Father    Social History  Substance Use Topics  . Smoking status: Current Every Day Smoker    Types: E-cigarettes  . Smokeless tobacco: Former Systems developer    Types: Snuff     Comment: does not use any tobacco products any more  . Alcohol Use: No    Review of Systems  Unable to perform ROS: Mental status change      Allergies  Review of patient's allergies indicates no known allergies.  Home Medications   Prior to Admission medications   Medication Sig Start Date End Date Taking? Authorizing Provider  amLODipine (NORVASC) 10 MG tablet Take 1 tablet (10 mg total) by mouth daily. 06/12/15   Eugenie Filler, MD  baclofen (LIORESAL) 10 MG  tablet Take 1 tablet (10 mg total) by mouth at bedtime. 08/17/15   Costin Karlyne Greenspan, MD  citalopram (CELEXA) 20 MG tablet Take 20 mg by mouth daily.    Historical Provider, MD  docusate sodium (COLACE) 100 MG capsule Take 1 capsule (100 mg total) by mouth 3 (three) times daily as needed for mild constipation. 09/28/14   Melina Schools, MD  gabapentin (NEURONTIN) 600 MG tablet Take 1 tablet (600 mg total) by mouth 2 (two) times daily. Morning and Lunch 08/18/15   Costin Karlyne Greenspan, MD  gabapentin (NEURONTIN) 600 MG tablet Take 2 tablets (1,200 mg total) by mouth at bedtime. 08/17/15   Costin Karlyne Greenspan, MD  levofloxacin (LEVAQUIN) 500 MG tablet Take 1 tablet (500 mg total) by mouth daily. 08/17/15   Costin Karlyne Greenspan, MD  metoprolol (LOPRESSOR) 50 MG tablet Take 1 tablet (50 mg total) by mouth 2 (two) times  daily. 06/12/15   Eugenie Filler, MD  morphine (MS CONTIN) 60 MG 12 hr tablet Take 1 tablet (60 mg total) by mouth every 12 (twelve) hours. 08/17/15   Costin Karlyne Greenspan, MD  polyethylene glycol (MIRALAX / GLYCOLAX) packet Take 17 g by mouth daily. Patient taking differently: Take 17 g by mouth daily as needed for mild constipation or moderate constipation.  06/12/15   Eugenie Filler, MD  tamsulosin (FLOMAX) 0.4 MG CAPS capsule Take 0.4 mg by mouth daily.    Historical Provider, MD  vitamin B-12 (CYANOCOBALAMIN) 100 MCG tablet Take 1 tablet (100 mcg total) by mouth daily. 08/17/15   Costin Karlyne Greenspan, MD   BP 92/57 mmHg  Pulse 106  Temp(Src) 99.5 F (37.5 C) (Axillary)  Resp 22  Wt 186 lb 8.2 oz (84.6 kg)  SpO2 95% Physical Exam  Constitutional: He appears well-developed and well-nourished.  HENT:  Head: Normocephalic and atraumatic.  Eyes: Pupils are equal, round, and reactive to light.  Neck: Neck supple.  Cardiovascular: Normal rate, regular rhythm and normal heart sounds.   Pulmonary/Chest:  Mild tachypnea.  Rhonchi bilaterally.  Abdominal: Soft. He exhibits no distension. There is no tenderness.  Musculoskeletal: Normal range of motion.  Neurological:  Arousable to tactile stimuli.  Does not follow simple commands.  Does appear to move all 4 extremities localizes to pain  Skin: Skin is warm and dry.  Psychiatric: He has a normal mood and affect. Judgment normal.  Nursing note and vitals reviewed.   ED Course  Procedures (including critical care time)  CRITICAL CARE Performed by: Hoy Morn Total critical care time: 35 minutes Critical care time was exclusive of separately billable procedures and treating other patients. Critical care was necessary to treat or prevent imminent or life-threatening deterioration. Critical care was time spent personally by me on the following activities: development of treatment plan with patient and/or surrogate as well as nursing,  discussions with consultants, evaluation of patient's response to treatment, examination of patient, obtaining history from patient or surrogate, ordering and performing treatments and interventions, ordering and review of laboratory studies, ordering and review of radiographic studies, pulse oximetry and re-evaluation of patient's condition.  INTUBATION Performed by: Hoy Morn and Domenic Moras, PA Required items: required blood products, implants, devices, and special equipment available Patient identity confirmed: provided demographic data and hospital-assigned identification number Time out: Immediately prior to procedure a "time out" was called to verify the correct patient, procedure, equipment, support staff and site/side marked as required. Indications: respiratory failure Intubation method: Glidescope Laryngoscopy  Preoxygenation: BVM Sedatives: Etomidate  Paralytic: Succinylcholine Tube Size: 8-0 cuffed Post-procedure assessment: chest rise and ETCO2 monitor Breath sounds: equal and absent over the epigastrium Tube secured with: ETT holder Chest x-ray interpreted by radiologist and me. Chest x-ray findings: endotracheal tube in appropriate position Patient tolerated the procedure well with no immediate complications.     Labs Review Labs Reviewed  CBC WITH DIFFERENTIAL/PLATELET - Abnormal; Notable for the following:    WBC 12.5 (*)    RBC 3.92 (*)    Hemoglobin 10.7 (*)    HCT 34.5 (*)    Neutro Abs 11.4 (*)    Lymphs Abs 0.4 (*)    All other components within normal limits  COMPREHENSIVE METABOLIC PANEL - Abnormal; Notable for the following:    Sodium 133 (*)    Potassium 5.6 (*)    Chloride 97 (*)    Glucose, Bld 132 (*)    BUN 42 (*)    Creatinine, Ser 2.97 (*)    Calcium 8.8 (*)    Albumin 3.3 (*)    ALT 11 (*)    GFR calc non Af Amer 20 (*)    GFR calc Af Amer 24 (*)    All other components within normal limits  CBG MONITORING, ED - Abnormal; Notable for  the following:    Glucose-Capillary 100 (*)    All other components within normal limits  I-STAT ARTERIAL BLOOD GAS, ED - Abnormal; Notable for the following:    pH, Arterial 7.254 (*)    pCO2 arterial 51.9 (*)    pO2, Arterial 68.0 (*)    Acid-base deficit 5.0 (*)    All other components within normal limits  I-STAT CG4 LACTIC ACID, ED - Abnormal; Notable for the following:    Lactic Acid, Venous 3.44 (*)    All other components within normal limits  CULTURE, BLOOD (ROUTINE X 2)  CULTURE, BLOOD (ROUTINE X 2)  URINE CULTURE  ETHANOL  URINE RAPID DRUG SCREEN, HOSP PERFORMED  AMMONIA  URINALYSIS, ROUTINE W REFLEX MICROSCOPIC (NOT AT [facility])  OCCULT BLOOD X 1 CARD TO LAB, STOOL  TYPE AND SCREEN  ABO/RH   BUN  Date Value Ref Range Status  09/26/2015 42* 6 - 20 mg/dL Final  08/17/2015 6 6 - 20 mg/dL Final  08/16/2015 10 6 - 20 mg/dL Final  08/15/2015 37* 6 - 20 mg/dL Final   CREATININE, SER  Date Value Ref Range Status  09/26/2015 2.97* 0.61 - 1.24 mg/dL Final  08/17/2015 0.73 0.61 - 1.24 mg/dL Final  08/16/2015 0.79 0.61 - 1.24 mg/dL Final  08/15/2015 1.31* 0.61 - 1.24 mg/dL Final       Imaging Review Dg Chest Portable 1 View  09/26/2015  CLINICAL DATA:  Intubation, altered mental status, hypertension, smoker EXAM: PORTABLE CHEST 1 VIEW COMPARISON:  Portable exam 1047 hours compared to 09/26/2015 FINDINGS: Tip of endotracheal tube projects 2.5 cm above carina. Nasogastric tube coiled in stomach. Spinal stimulator leads project over mid chest. Minimal enlargement of cardiac silhouette. Atherosclerotic calcification aorta. Low lung volumes with LEFT perihilar and lower lobe infiltrate, questionably involving lingula as well. Mild RIGHT basilar atelectasis. No gross pleural effusion or pneumothorax. Bones demineralized with note of old RIGHT clavicular fracture and LEFT shoulder arthroplasty. IMPRESSION: Persistent LEFT perihilar and lower lobe infiltrates question pneumonia.  Minimal RIGHT basilar atelectasis. Low lung volumes. Electronically Signed   By: Lavonia Dana M.D.   On: 09/26/2015 11:02   Dg Chest Portable 1 View  09/26/2015  CLINICAL DATA:  Hypoxia.  Altered mental status. EXAM: PORTABLE CHEST 1 VIEW COMPARISON:  PA and lateral chest 08/14/2015 and 05/15/2015. CT chest 06/08/2015. FINDINGS: Patchy airspace disease is seen in the lingula. The right lung is clear. Heart size is normal. No pneumothorax or pleural effusion. Spinal stimulator and left shoulder replacement are noted. The patient has remote right clavicle fracture. IMPRESSION: Patchy airspace disease in the lingula worrisome for pneumonia. Electronically Signed   By: Inge Rise M.D.   On: 09/26/2015 09:40   I have personally reviewed and evaluated these images and lab results as part of my medical decision-making.   EKG Interpretation   Date/Time:  Wednesday September 26 2015 09:09:23 EST Ventricular Rate:  98 PR Interval:  182 QRS Duration: 84 QT Interval:  345 QTC Calculation: 440 R Axis:   88 Text Interpretation:  Sinus rhythm Borderline right axis deviation No  significant change was found Confirmed by Shagun Wordell  MD, Rawson Minix ([id number]) on  09/26/2015 9:52:25 AM      MDM   Final diagnoses:  None    Patient presents with what sounds like aspiration pneumonia.  I suspect he may have been oversedated with his home pain medication and vomited.  Profound hypoxia that improves with significant supplemental oxygen however his mental status does not support BiPAP at this time and after long discussion with the family a decision was made to mechanically then laid the patient.  He was given Narcan initially with some improvement in his middle status for 1-2 minutes but then began becoming difficult to arouse again.  Given his significant oxygen requirement and is subtle infiltrate.  This is aspiration pneumonia.  He was given thank and Zosyn and thus covered for both hospital-acquired pneumonia and  aspiration pneumonia.  His lactate is elevated at 3.4.  He received 30 cc/kg bolus for his hypotension on arrival.  His rectal temperature is 98.7.  He did have several episodes of vomiting while in the emergency department and this was nonbloody and non-coffee-ground per my nursing staff.  Stool was brown.  Abdominal exam is benign.  Patient has acute kidney injury as well.  Foley catheter will be placed to monitor urine output.  Family updated.  Chaplain involved to support the family.  I spoke with pulmonary critical care who will see the patient in the emergency department for admission to the ICU.    Jola Schmidt, MD 09/26/15 [phone]

## 2015-09-27 ENCOUNTER — Inpatient Hospital Stay (HOSPITAL_COMMUNITY): Payer: PPO

## 2015-09-27 DIAGNOSIS — J9601 Acute respiratory failure with hypoxia: Secondary | ICD-10-CM | POA: Diagnosis not present

## 2015-09-27 DIAGNOSIS — N179 Acute kidney failure, unspecified: Secondary | ICD-10-CM

## 2015-09-27 DIAGNOSIS — J690 Pneumonitis due to inhalation of food and vomit: Secondary | ICD-10-CM | POA: Diagnosis not present

## 2015-09-27 DIAGNOSIS — K920 Hematemesis: Secondary | ICD-10-CM | POA: Insufficient documentation

## 2015-09-27 DIAGNOSIS — G9340 Encephalopathy, unspecified: Secondary | ICD-10-CM

## 2015-09-27 LAB — GLUCOSE, CAPILLARY
GLUCOSE-CAPILLARY: 137 mg/dL — AB (ref 65–99)
GLUCOSE-CAPILLARY: 117 mg/dL — AB (ref 65–99)
Glucose-Capillary: 83 mg/dL (ref 65–99)
Glucose-Capillary: 121 mg/dL — ABNORMAL HIGH (ref 65–99)

## 2015-09-27 LAB — POCT I-STAT 3, ART BLOOD GAS (G3+)
Acid-base deficit: 2 mmol/L (ref 0.0–2.0)
Bicarbonate: 19.9 mEq/L — ABNORMAL LOW (ref 20.0–24.0)
O2 SAT: 99 %
PH ART: 7.527 — AB (ref 7.350–7.450)
TCO2: 21 mmol/L (ref 0–100)
pCO2 arterial: 24 mmHg — ABNORMAL LOW (ref 35.0–45.0)
pO2, Arterial: 111 mmHg — ABNORMAL HIGH (ref 80.0–100.0)

## 2015-09-27 LAB — URINE CULTURE: Culture: NO GROWTH

## 2015-09-27 LAB — CBC
HCT: 25.4 % — ABNORMAL LOW (ref 39.0–52.0)
HEMOGLOBIN: 8.3 g/dL — AB (ref 13.0–17.0)
MCH: 27.4 pg (ref 26.0–34.0)
MCHC: 32.7 g/dL (ref 30.0–36.0)
MCV: 83.8 fL (ref 78.0–100.0)
Platelets: 227 10*3/uL (ref 150–400)
RBC: 3.03 MIL/uL — ABNORMAL LOW (ref 4.22–5.81)
RDW: 14.6 % (ref 11.5–15.5)
WBC: 12.4 10*3/uL — ABNORMAL HIGH (ref 4.0–10.5)

## 2015-09-27 LAB — BASIC METABOLIC PANEL
Anion gap: 4 — ABNORMAL LOW (ref 5–15)
BUN: 28 mg/dL — AB (ref 6–20)
CO2: 23 mmol/L (ref 22–32)
Calcium: 7.8 mg/dL — ABNORMAL LOW (ref 8.9–10.3)
Chloride: 110 mmol/L (ref 101–111)
Creatinine, Ser: 1.45 mg/dL — ABNORMAL HIGH (ref 0.61–1.24)
GFR, EST AFRICAN AMERICAN: 56 mL/min — AB (ref >60)
GFR, EST NON AFRICAN AMERICAN: 48 mL/min — AB (ref >60)
Glucose, Bld: 125 mg/dL — ABNORMAL HIGH (ref 65–99)
POTASSIUM: 4 mmol/L (ref 3.5–5.1)
SODIUM: 137 mmol/L (ref 135–145)

## 2015-09-27 LAB — STREP PNEUMONIAE URINARY ANTIGEN: STREP PNEUMO URINARY ANTIGEN: NEGATIVE

## 2015-09-27 LAB — PROCALCITONIN: PROCALCITONIN: 3.79 ng/mL

## 2015-09-27 MED ORDER — FENTANYL BOLUS VIA INFUSION
75.0000 ug | INTRAVENOUS | Status: DC | PRN
  Filled 2015-09-27: qty 75

## 2015-09-27 MED ORDER — SODIUM CHLORIDE 0.9 % IV SOLN
25.0000 ug/h | INTRAVENOUS | Status: DC
  Administered 2015-09-27 (×2): 200 ug/h via INTRAVENOUS
  Filled 2015-09-27: qty 50

## 2015-09-27 MED ORDER — FENTANYL CITRATE (PF) 100 MCG/2ML IJ SOLN
50.0000 ug | Freq: Once | INTRAMUSCULAR | Status: DC
Start: 2015-09-27 — End: 2015-09-27

## 2015-09-27 MED ORDER — FENTANYL CITRATE (PF) 100 MCG/2ML IJ SOLN
INTRAMUSCULAR | Status: AC
  Administered 2015-09-27: 75 ug
  Filled 2015-09-27: qty 2

## 2015-09-27 MED ORDER — FENTANYL BOLUS VIA INFUSION
25.0000 ug | INTRAVENOUS | Status: DC | PRN
  Filled 2015-09-27: qty 25

## 2015-09-27 MED ORDER — DEXMEDETOMIDINE HCL IN NACL 200 MCG/50ML IV SOLN
0.4000 ug/kg/h | INTRAVENOUS | Status: DC
  Administered 2015-09-27: 0.5 ug/kg/h via INTRAVENOUS
  Administered 2015-09-27: 1.2 ug/kg/h via INTRAVENOUS
  Filled 2015-09-27: qty 100

## 2015-09-27 MED ORDER — CETYLPYRIDINIUM CHLORIDE 0.05 % MT LIQD
7.0000 mL | Freq: Two times a day (BID) | OROMUCOSAL | Status: DC
  Administered 2015-09-27: 7 mL via OROMUCOSAL

## 2015-09-27 MED ORDER — HALOPERIDOL LACTATE 5 MG/ML IJ SOLN
1.0000 mg | INTRAMUSCULAR | Status: DC | PRN
  Administered 2015-09-27 – 2015-09-28 (×2): 4 mg via INTRAVENOUS
  Filled 2015-09-27 (×2): qty 1

## 2015-09-27 MED ORDER — ANTISEPTIC ORAL RINSE SOLUTION (CORINZ)
7.0000 mL | Freq: Four times a day (QID) | OROMUCOSAL | Status: DC
  Administered 2015-09-27 – 2015-09-28 (×4): 7 mL via OROMUCOSAL

## 2015-09-27 MED ORDER — NICOTINE 21 MG/24HR TD PT24
21.0000 mg | MEDICATED_PATCH | Freq: Every day | TRANSDERMAL | Status: DC
  Administered 2015-09-27 – 2015-09-28 (×2): 21 mg via TRANSDERMAL
  Filled 2015-09-27 (×2): qty 1

## 2015-09-27 MED ORDER — MIDAZOLAM HCL 2 MG/2ML IJ SOLN: 1.0000 mg | Freq: Once | INTRAMUSCULAR | Status: DC

## 2015-09-27 MED ORDER — DEXMEDETOMIDINE HCL IN NACL 400 MCG/100ML IV SOLN
0.4000 ug/kg/h | INTRAVENOUS | Status: DC
  Administered 2015-09-27 (×2): 1.7 ug/kg/h via INTRAVENOUS
  Administered 2015-09-28: 0.5 ug/kg/h via INTRAVENOUS
  Filled 2015-09-27 (×4): qty 100

## 2015-09-27 MED ORDER — CHLORHEXIDINE GLUCONATE 0.12% ORAL RINSE (MEDLINE KIT)
15.0000 mL | Freq: Two times a day (BID) | OROMUCOSAL | Status: DC
  Administered 2015-09-27 – 2015-09-28 (×3): 15 mL via OROMUCOSAL

## 2015-09-27 NOTE — Progress Notes (Signed)
PULMONARY / CRITICAL CARE MEDICINE   Name: William Jordan MRN: [id number] DOB: [birth year]    ADMISSION DATE:  09/26/2015  REFERRING MD:  ED  CHIEF COMPLAINT:  Altered Mental Status  HISTORY OF PRESENT ILLNESS:   [name] is a 70 y.o. male w/ PMHx of chronic pain on chronic narcotics, presented to the ED after wife found him down at home with hematemesis. The wife started CPR but the patient woke up during this. Transported to the ED by EMS. Had witnessed aspiration event in ED per chart review.    SUBJECTIVE:  Quite agitated during wean, significant secretions in ETT. For extubation this AM.   VITAL SIGNS: BP 103/74 mmHg  Pulse 81  Temp(Src) 98.3 F (36.8 C) (Oral)  Resp 25  Ht 5\' 10"  (1.778 m)  Wt 200 lb 9.9 oz (91 kg)  BMI 28.79 kg/m2  SpO2 97%  HEMODYNAMICS: CVP:  [8 mmHg-20 mmHg] 8 mmHg  VENTILATOR SETTINGS: Vent Mode:  [-] PRVC FiO2 (%):  [40 %-80 %] 40 % Set Rate:  [16 bmp-24 bmp] 16 bmp Vt Set:  [600 mL] 600 mL PEEP:  [5 cmH20] 5 cmH20 Plateau Pressure:  [17 cmH20-23 cmH20] 19 cmH20  INTAKE / OUTPUT: I/O last 3 completed shifts: In: 5930.5 [I.V.:5755.5; NG/GT:100; IV Piggyback:75] Out: 2635 [Urine:2485; Emesis/NG output:150]  PHYSICAL EXAMINATION: General:  Alert, follows commands. In mild distress on wean.  Neuro:  Moves all extremities spontaneously. No focal deficits.  HEENT:  ETT/OGT in place. Moist mucus membranes.  Cardiovascular:  Tachycardic, no murmurs, gallops, rubs.  Lungs:  Coarse breath sounds, no wheezes or rales.  Abdomen:  Soft, non-tender, non-distended. BS present.  Musculoskeletal:  No edema, distal pulses intact.  Skin:  No rashes  LABS:  BMET  Recent Labs Lab 09/26/15 0917 09/26/15 1454 09/27/15 0600  NA 133* 135 137  K 5.6* 5.8* 4.0  CL 97* 107 110  CO2 22 20* 23  BUN 42* 37* 28*  CREATININE 2.97* 2.05* 1.45*  GLUCOSE 132* 152* 125*    Electrolytes  Recent Labs Lab 09/26/15 0917 09/26/15 1454 09/27/15 0600   CALCIUM 8.8* 7.4* 7.8*  MG  --  1.5*  --   PHOS  --  3.5  --     CBC  Recent Labs Lab 09/26/15 0917 09/26/15 1454 09/27/15 0730  WBC 12.5* 13.8* 12.4*  HGB 10.7* 9.6* 8.3*  HCT 34.5* 30.5* 25.4*  PLT 333 327 227    Coag's  Recent Labs Lab 09/26/15 1454  APTT 30  INR 1.17    Sepsis Markers  Recent Labs Lab 09/26/15 0933 09/26/15 1454 09/27/15 0600  LATICACIDVEN 3.44* 2.0  --   PROCALCITON  --  5.92 3.79    ABG  Recent Labs Lab 09/26/15 1010 09/26/15 1251 09/27/15 0431  PHART 7.254* 7.264* 7.527*  PCO2ART 51.9* 33.8* 24.0*  PO2ART 68.0* 86.0 111.0*    Liver Enzymes  Recent Labs Lab 09/26/15 0917 09/26/15 1454  AST 22 41  ALT 11* 21  ALKPHOS 111 87  BILITOT 0.6 0.8  ALBUMIN 3.3* 2.5*    Glucose  Recent Labs Lab 09/26/15 1002 09/26/15 1328  GLUCAP 100* 107*    Imaging Dg Chest Port 1 View  09/27/2015  CLINICAL DATA:  Respiratory failure, sepsis, current smoker. EXAM: PORTABLE CHEST 1 VIEW COMPARISON:  Portable chest x-ray of July 26, 2016 FINDINGS: The lungs are mildly hypoinflated. The retrocardiac region on the left remains dense in the left hemidiaphragm obscured. The cardiac silhouette  is mildly enlarged. The central pulmonary vascularity is mildly prominent. The endotracheal tube tip projects 3.2 cm above the carina. The left internal jugular venous catheter tip projects over the proximal SVC. The esophagogastric tube tip projects below the inferior margin of the image. IMPRESSION: Allowing for differences in positioning there has not been dramatic interval change since yesterday's study. There is persistent left lower lobe atelectasis or pneumonia with small left pleural effusion. Mild right basilar atelectasis is suspected as well. The support tubes are in reasonable position. Electronically Signed   By: David  Martinique M.D.   On: 09/27/2015 07:24   Dg Chest Port 1 View  09/26/2015  CLINICAL DATA:  Central line infection.  ETT  placement. EXAM: PORTABLE CHEST 1 VIEW COMPARISON:  08/14/2015 FINDINGS: There is an endotracheal tube with the tip 2.4 cm above the carina. There is a nasogastric tube coursing below the diaphragm. There is a left-sided central venous catheter with the tip at the confluence of the brachiocephalic vein and SVC. Small left pleural effusion. Left basilar airspace disease likely reflecting atelectasis versus pneumonia. There is no pneumothorax. The heart and mediastinal contours are unremarkable. Left shoulder arthroplasty. IMPRESSION: 1. Endotracheal tube with the tip 2.4 cm above the carina. 2. Small left pleural effusion with left basilar airspace disease which may reflect atelectasis versus pneumonia. Electronically Signed   By: Kathreen Devoid   On: 09/26/2015 12:44   Dg Chest Portable 1 View  09/26/2015  CLINICAL DATA:  Status post exchange of endotracheal tube. EXAM: PORTABLE CHEST 1 VIEW COMPARISON:  Single view of the chest earlier today. FINDINGS: Endotracheal tube is in place with the tip in good position at the level of the clavicular heads. NG tube is in the stomach. Airspace disease in left mid and lower lung zones is unchanged. No new abnormality. IMPRESSION: ET tube is in good position.  No other change. Electronically Signed   By: Inge Rise M.D.   On: 09/26/2015 11:53   Dg Chest Portable 1 View  09/26/2015  CLINICAL DATA:  Intubation, altered mental status, hypertension, smoker EXAM: PORTABLE CHEST 1 VIEW COMPARISON:  Portable exam 1047 hours compared to 09/26/2015 FINDINGS: Tip of endotracheal tube projects 2.5 cm above carina. Nasogastric tube coiled in stomach. Spinal stimulator leads project over mid chest. Minimal enlargement of cardiac silhouette. Atherosclerotic calcification aorta. Low lung volumes with LEFT perihilar and lower lobe infiltrate, questionably involving lingula as well. Mild RIGHT basilar atelectasis. No gross pleural effusion or pneumothorax. Bones demineralized with  note of old RIGHT clavicular fracture and LEFT shoulder arthroplasty. IMPRESSION: Persistent LEFT perihilar and lower lobe infiltrates question pneumonia. Minimal RIGHT basilar atelectasis. Low lung volumes. Electronically Signed   By: Lavonia Dana M.D.   On: 09/26/2015 11:02   Dg Chest Portable 1 View  09/26/2015  CLINICAL DATA:  Hypoxia.  Altered mental status. EXAM: PORTABLE CHEST 1 VIEW COMPARISON:  PA and lateral chest 08/14/2015 and 05/15/2015. CT chest 06/08/2015. FINDINGS: Patchy airspace disease is seen in the lingula. The right lung is clear. Heart size is normal. No pneumothorax or pleural effusion. Spinal stimulator and left shoulder replacement are noted. The patient has remote right clavicle fracture. IMPRESSION: Patchy airspace disease in the lingula worrisome for pneumonia. Electronically Signed   By: Inge Rise M.D.   On: 09/26/2015 09:40     STUDIES:  None  CULTURES: Blood 1/11 >> Urine 1/11 >> Sputum 1/11 >>  ANTIBIOTICS: Unasyn 1/11 >> 1/11 Vanc 1/11 >>  Zosyn 1/11 >>  SIGNIFICANT EVENTS: 1/11 Found down, admitted to ICU 1/12 Extuabted  LINES/TUBES: ETT 1/11 >> 1/12 LIJ CVL 1/11 >>  DISCUSSION: 70 y.o. male w/ PMHx of chronic pain on chronic narcotics, admitted for likely accidental narcotic overdose, respiratory failure, and aspiration event.   ASSESSMENT / PLAN:  PULMONARY A: VDRF 2/2 Narcotics Aspiration Pneumonia P:   Extubated this AM See ID  CARDIOVASCULAR A:  Hypotension; septic vs hypovolemic H/o HTN P:  CVP 20 noted IVF decreased to 50 cc/hr Levophed for MAP < 65; off currently  RENAL A:   AKI most likely 2/2 dehydration/hypotension Hyperkalemia; resolved P:   IVF as above Pressors  Repeat BMP in AM  GASTROINTESTINAL A:   H/o GERD, PUD Hematemesis Nutrition SUP  P:   Protonix NPO for now, advance as tolerated  HEMATOLOGIC A:   Normocytic Anemia Leukocytosis DVT PPx P:  CBC in AM SCD's  INFECTIOUS A:    Witnessed Aspiration event Septic Shock? P:   Continue Vanc/Zosyn Follow cultures  ENDOCRINE A:   No acute issues   P:   Glucose on BMP  NEUROLOGIC A:   Chronic Narcotic use P:   Hold home narcotics for now   FAMILY  - Updates: Family updated 1/11    Natasha Bence, MD PGY-3, Internal Medicine Pager: [phone]   09/27/2015, 8:14 AM

## 2015-09-27 NOTE — Clinical Documentation Improvement (Signed)
Internal Medicine  Can the diagnosis of encephalopathy be further specified?   Encephalopathy - Alcoholic, Anoxic/Hypoxia, Drug Induced/Toxic (specify drug), Hepatic, Hypertensive, Hypoglycemic, Metabolic/Septic, Traumatic/post concussive, Wernicke, Other  Other  Clinically Undetermined  Document any associated diagnoses/conditions. VDRF 2/2 Narcotics, Aspiration pneumonia, hypotension  Supporting Information: 09/27/15 :CHIEF COMPLAINT: Altered Mental Status PMH chronic pain on chronic narcotics, admitted for likely accidental narcotic overdose, respiratory failure, and aspiration event.   Please exercise your independent, professional judgment when responding. A specific answer is not anticipated or expected. Please update your documentation within the medical record to reflect your response to this query. Thank you  Thank You,  [place] [phone]

## 2015-09-27 NOTE — Procedures (Signed)
Extubation Procedure Note  Patient Details:   Name: William Jordan DOB: 08-13-1946 MRN: [id number]   Airway Documentation:     Evaluation  O2 sats: stable throughout Complications: No apparent complications Patient did tolerate procedure well. Bilateral Breath Sounds: Rhonchi Suctioning: Airway Yes   Patient extubated to 4L nasal cannula per MD order.  Positive cuff leak noted.  No evidence of stridor.  Patient able to speak post extubation.  Sats currently 94%.  Vitals are stable.  No apparent complications.   Philomena Doheny 09/27/2015, 8:57 AM

## 2015-09-27 NOTE — Progress Notes (Signed)
Wasted 150 ml of fentanyl in the sink. Witnessed by [employer]

## 2015-09-28 ENCOUNTER — Inpatient Hospital Stay (HOSPITAL_COMMUNITY): Payer: PPO

## 2015-09-28 DIAGNOSIS — I514 Myocarditis, unspecified: Secondary | ICD-10-CM | POA: Diagnosis not present

## 2015-09-28 DIAGNOSIS — T40604D Poisoning by unspecified narcotics, undetermined, subsequent encounter: Secondary | ICD-10-CM

## 2015-09-28 DIAGNOSIS — N179 Acute kidney failure, unspecified: Secondary | ICD-10-CM | POA: Diagnosis not present

## 2015-09-28 DIAGNOSIS — J690 Pneumonitis due to inhalation of food and vomit: Secondary | ICD-10-CM | POA: Diagnosis not present

## 2015-09-28 DIAGNOSIS — G9340 Encephalopathy, unspecified: Secondary | ICD-10-CM | POA: Diagnosis not present

## 2015-09-28 DIAGNOSIS — A4901 Methicillin susceptible Staphylococcus aureus infection, unspecified site: Secondary | ICD-10-CM | POA: Diagnosis not present

## 2015-09-28 DIAGNOSIS — T40601D Poisoning by unspecified narcotics, accidental (unintentional), subsequent encounter: Secondary | ICD-10-CM

## 2015-09-28 DIAGNOSIS — T40601A Poisoning by unspecified narcotics, accidental (unintentional), initial encounter: Secondary | ICD-10-CM | POA: Diagnosis present

## 2015-09-28 LAB — GLUCOSE, CAPILLARY
GLUCOSE-CAPILLARY: 106 mg/dL — AB (ref 65–99)
GLUCOSE-CAPILLARY: 94 mg/dL (ref 65–99)
GLUCOSE-CAPILLARY: 95 mg/dL (ref 65–99)
Glucose-Capillary: 92 mg/dL (ref 65–99)
Glucose-Capillary: 104 mg/dL — ABNORMAL HIGH (ref 65–99)
Glucose-Capillary: 80 mg/dL (ref 65–99)

## 2015-09-28 LAB — PROCALCITONIN: PROCALCITONIN: 1.27 ng/mL

## 2015-09-28 MED ORDER — SODIUM CHLORIDE 0.9 % IV SOLN
3.0000 g | Freq: Three times a day (TID) | INTRAVENOUS | Status: DC
  Administered 2015-09-28 – 2015-09-29 (×3): 3 g via INTRAVENOUS
  Filled 2015-09-28 (×5): qty 3

## 2015-09-28 MED ORDER — VANCOMYCIN HCL IN DEXTROSE 750-5 MG/150ML-% IV SOLN
750.0000 mg | Freq: Two times a day (BID) | INTRAVENOUS | Status: DC
  Administered 2015-09-28 – 2015-09-29 (×2): 750 mg via INTRAVENOUS
  Filled 2015-09-28 (×5): qty 150

## 2015-09-28 MED ORDER — HALOPERIDOL LACTATE 5 MG/ML IJ SOLN
5.0000 mg | Freq: Once | INTRAMUSCULAR | Status: AC
  Administered 2015-09-28: 5 mg via INTRAVENOUS
  Filled 2015-09-28: qty 1

## 2015-09-28 MED ORDER — MORPHINE SULFATE ER 15 MG PO TBCR
15.0000 mg | EXTENDED_RELEASE_TABLET | Freq: Two times a day (BID) | ORAL | Status: DC
  Administered 2015-09-28 – 2015-10-02 (×9): 15 mg via ORAL
  Filled 2015-09-28 (×9): qty 1

## 2015-09-28 MED ORDER — BACLOFEN 10 MG PO TABS
10.0000 mg | ORAL_TABLET | Freq: Every day | ORAL | Status: DC
  Administered 2015-09-28 – 2015-10-01 (×4): 10 mg via ORAL
  Filled 2015-09-28 (×4): qty 1

## 2015-09-28 MED ORDER — GABAPENTIN 300 MG PO CAPS
600.0000 mg | ORAL_CAPSULE | Freq: Two times a day (BID) | ORAL | Status: DC
  Administered 2015-09-28 – 2015-10-02 (×9): 600 mg via ORAL
  Filled 2015-09-28: qty 6
  Filled 2015-09-28 (×2): qty 2
  Filled 2015-09-28: qty 6
  Filled 2015-09-28: qty 2
  Filled 2015-09-28: qty 6
  Filled 2015-09-28 (×5): qty 2

## 2015-09-28 MED ORDER — CETYLPYRIDINIUM CHLORIDE 0.05 % MT LIQD
7.0000 mL | Freq: Two times a day (BID) | OROMUCOSAL | Status: DC
  Administered 2015-09-28 – 2015-10-02 (×8): 7 mL via OROMUCOSAL

## 2015-09-28 MED ORDER — SENNOSIDES-DOCUSATE SODIUM 8.6-50 MG PO TABS
1.0000 | ORAL_TABLET | Freq: Two times a day (BID) | ORAL | Status: DC
  Administered 2015-09-28 – 2015-10-01 (×7): 1 via ORAL
  Filled 2015-09-28 (×8): qty 1

## 2015-09-28 MED ORDER — MORPHINE SULFATE ER 30 MG PO TBCR: 30.0000 mg | EXTENDED_RELEASE_TABLET | Freq: Two times a day (BID) | ORAL | Status: DC

## 2015-09-28 NOTE — Progress Notes (Signed)
eLink Physician-Brief Progress Note Patient Name: MILBURN HUSEBY DOB: [birth year] MRN: [id number]   Date of Service  09/28/2015  HPI/Events of Note  Patient transferred out of ICU today to stepdown.  Now attempting to get OOB, some confusion/delirium.  Had been on precedex.  Has issues with chronic pain on MS Contin and Baclofen.    eICU Interventions  Plan: Haldol 5 mg IV now.  Repeat in 30 minutes if patient remains agitated.  QTc on EKG on 1/11 less than 500.     Intervention Category Intermediate Interventions: Other:  Faatimah Spielberg 09/28/2015, 9:43 PM

## 2015-09-28 NOTE — Progress Notes (Signed)
Echocardiogram 2D Echocardiogram has been performed.  William Jordan 09/28/2015, 4:19 PM

## 2015-09-28 NOTE — Progress Notes (Signed)
Patients wife called and updated on patient's status and change in unit/room.

## 2015-09-28 NOTE — Consult Note (Signed)
[facility] for Infectious Disease    Date of Admission:  09/26/2015   Total days of antibiotics 3              Reason for Consult: Automatic consultation for staph aureus bacteremia   Principal Problem:   Staphylococcus aureus bacteremia with sepsis [facility]) Active Problems:   Aspiration pneumonia ([place])   Narcotic overdose   Respiratory failure ([place])   Acute respiratory failure with hypoxia ([facility])   Hematemesis   . ampicillin-sulbactam (UNASYN) IV  3 g Intravenous Q8H  . antiseptic oral rinse  7 mL Mouth Rinse BID  . baclofen  10 mg Per Tube QHS  . gabapentin  600 mg Oral BID  . midazolam  1 mg Intravenous Once  . midazolam  1 mg Intravenous Once  . morphine  15 mg Oral Q12H  . nicotine  21 mg Transdermal Daily  . pantoprazole (PROTONIX) IV  40 mg Intravenous Q12H  . senna-docusate  1 tablet Oral BID  . tamsulosin  0.4 mg Oral Daily  . vancomycin  750 mg Intravenous Q12H    Recommendations: 1. Continue vancomycin and ampicillin sulbactam 2. Await results of repeat blood cultures 3. Transthoracic echocardiogram   Assessment: He has transient staph aureus bacteremia in the setting of overdose and aspiration pneumonia. I agree with current antibiotic therapy to cover both him MRSA, MSSA and aspiration pneumonia pending antibiotic susceptibility results. Repeat blood cultures done on the day of admission are negative to date. He needs a transthoracic echocardiogram.    HPI: William Jordan is a 70 y.o. male  who was admitted to the hospital 2 days ago with acute respiratory failure related to narcotic overdose. He was febrile and appears to have aspiration pneumonia. One of 2 admission blood cultures is growing staph aureus. 2 more sets later that day are negative to date. He was intubated initially but has been successfully extubated.  Review of Systems: Review of Systems  Unable to perform ROS: mental acuity    Past Medical History  Diagnosis Date  .  Hypertension   . Anxiety     takes Celexa  . Enlarged prostate   . Neuropathy ([place])   . Arthritis   . History of blood transfusion   . Chronic back pain     Social History  Substance Use Topics  . Smoking status: Current Every Day Smoker    Types: E-cigarettes  . Smokeless tobacco: Former Systems developer    Types: Snuff     Comment: does not use any tobacco products any more  . Alcohol Use: No    Family History  Problem Relation Age of Onset  . Heart disease Father    No Known Allergies  OBJECTIVE: Blood pressure 171/94, pulse 75, temperature 98 F (36.7 C), temperature source Oral, resp. rate 18, height 5\' 10"  (1.778 m), weight 199 lb 15.3 oz (90.7 kg), SpO2 100 %.  Physical Exam  Constitutional:  He is alert and talkative but confused.  Eyes: Conjunctivae are normal.  Neck: Neck supple.  Cardiovascular: Normal rate and regular rhythm.   Very distant heart sounds. No murmur heard.  Pulmonary/Chest: Effort normal and breath sounds normal.  His lungs are clear anteriorly.  Abdominal: Soft. He exhibits no mass. There is no tenderness.  Musculoskeletal: He exhibits no edema or tenderness.  Healed surgical incisions over left shoulder and left knee.  Neurological: He is alert.  Skin: No rash noted.  Lab Results Lab Results  Component Value Date   WBC 12.4* 09/27/2015   HGB 8.3* 09/27/2015   HCT 25.4* 09/27/2015   MCV 83.8 09/27/2015   PLT 227 09/27/2015    Lab Results  Component Value Date   CREATININE 1.45* 09/27/2015   BUN 28* 09/27/2015   NA 137 09/27/2015   K 4.0 09/27/2015   CL 110 09/27/2015   CO2 23 09/27/2015    Lab Results  Component Value Date   ALT 21 09/26/2015   AST 41 09/26/2015   ALKPHOS 87 09/26/2015   BILITOT 0.8 09/26/2015     Microbiology: Recent Results (from the past 240 hour(s))  Blood culture (routine x 2)     Status: None (Preliminary result)   Collection Time: 09/26/15  9:17 AM  Result Value Ref Range Status   Specimen  Description BLOOD LEFT ANTECUBITAL  Final   Special Requests BOTTLES DRAWN AEROBIC AND ANAEROBIC 6MLS  Final   Culture  Setup Time   Final    GRAM POSITIVE COCCI IN CLUSTERS AEROBIC BOTTLE ONLY CRITICAL RESULT CALLED TO, READ BACK BY AND VERIFIED WITH: ROBIN,RN @0441  09/27/15 MKELLY    Culture   Final    STAPHYLOCOCCUS AUREUS SUSCEPTIBILITIES TO FOLLOW    Report Status PENDING  Incomplete  Blood culture (routine x 2)     Status: None (Preliminary result)   Collection Time: 09/26/15 10:03 AM  Result Value Ref Range Status   Specimen Description BLOOD RIGHT ANTECUBITAL  Final   Special Requests BOTTLES DRAWN AEROBIC AND ANAEROBIC 6CC  Final   Culture NO GROWTH 2 DAYS  Final   Report Status PENDING  Incomplete  Urine culture     Status: None   Collection Time: 09/26/15 12:00 PM  Result Value Ref Range Status   Specimen Description URINE, CLEAN CATCH  Final   Special Requests NONE  Final   Culture NO GROWTH 1 DAY  Final   Report Status 09/27/2015 FINAL  Final  MRSA PCR Screening     Status: None   Collection Time: 09/26/15  1:22 PM  Result Value Ref Range Status   MRSA by PCR NEGATIVE NEGATIVE Final    Comment:        The GeneXpert MRSA Assay (FDA approved for NASAL specimens only), is one component of a comprehensive MRSA colonization surveillance program. It is not intended to diagnose MRSA infection nor to guide or monitor treatment for MRSA infections.   Culture, blood (routine x 2)     Status: None (Preliminary result)   Collection Time: 09/26/15  2:25 PM  Result Value Ref Range Status   Specimen Description BLOOD RIGHT HAND  Final   Special Requests BOTTLES DRAWN AEROBIC ONLY 2CC  Final   Culture NO GROWTH 2 DAYS  Final   Report Status PENDING  Incomplete  Culture, blood (routine x 2)     Status: None (Preliminary result)   Collection Time: 09/26/15  2:30 PM  Result Value Ref Range Status   Specimen Description BLOOD LEFT HAND  Final   Special Requests BOTTLES  DRAWN AEROBIC ONLY 3CC  Final   Culture NO GROWTH 2 DAYS  Final   Report Status PENDING  Incomplete    Michel Bickers, MD [facility] [phone] pager   [phone] cell 09/28/2015, 2:28 PM

## 2015-09-28 NOTE — Progress Notes (Signed)
Pharmacy Antibiotic Follow-up Note  William Jordan is a 70 y.o. year-old male admitted on 09/26/2015.  The patient is currently on day 3 of vancomycin and day 1 of unasyn for aspiration pneumonia. Patient is afebrile, WBC 12.4 antibiotics narrowed today from Zosyn to Unasyn. Vancomycin will be continued due to 1 of 2 positive blood cultures with staph aureus.   Assessment/Plan: - Change vancomycin to 750 mg IV q12h - Change Zosyn to Unasyn 3g IV q8h - Monitor renal function, clinical progression and VT at steady state as needed - F/U C&S and LOT  Temp (24hrs), Avg:98.3 F (36.8 C), Min:97.6 F (36.4 C), Max:99.3 F (37.4 C)   Recent Labs Lab 09/26/15 0917 09/26/15 1454 09/27/15 0730  WBC 12.5* 13.8* 12.4*    Recent Labs Lab 09/26/15 0917 09/26/15 1454 09/27/15 0600  CREATININE 2.97* 2.05* 1.45*   Estimated Creatinine Clearance: 56 mL/min (by C-G formula based on Cr of 1.45).    No Known Allergies  Antimicrobials this admission: Vanc 1/11>>1/13 Zosyn 1/11>>1/13 Unasyn 1/13>>  Microbiology results: 1/11 UA - negative 1/11 BCx - 1 of 2 staph aureus 1/11 UCx - ngtd 1/11 MRSA PCR - negative  Thank you for allowing pharmacy to be a part of this patient's care.  Dimitri Ped, PharmD. PGY-1 Pharmacy Resident Pager: [phone] 09/28/2015 12:44 PM

## 2015-09-28 NOTE — Progress Notes (Signed)
PULMONARY / CRITICAL CARE MEDICINE   Name: William Jordan MRN: [id number] DOB: October 20, 1945    ADMISSION DATE:  09/26/2015 CONSULTATION DATE:  09/26/15  BRIEF PATIENT DESCRIPTION:  70 y.o. male with pmh of chronic pain on chronic narcotics therapy who was admitted for accidental narcotic overdose with consequent respiratory failure and aspiration pneumonia.  SIGNIFICANT EVENTS / STUDIES:   CULTURES: Blood 1/11 >> 1/2  Staph aureus   Urine 1/11 >> Sputum 1/11 >>  ANTIBIOTICS: Unasyn 1/11 >> 1/11 Vanc 1/11 >>  Zosyn 1/11 >>  SIGNIFICANT EVENTS: 1/11 Found down, admitted to ICU 1/12 Extuabted  LINES/TUBES: ETT 1/11 >> 1/12 [facility] CVL 1/11 >>    SUBJECTIVE: Pt with urinary retention of 950 mL overnight requiring I & O cath. He reports constipation and lower abdominal pain. He is not at baseline mental status per family at bedside. He required haldol last night and currently on precedex.   VITAL SIGNS: Temp:  [97.6 F (36.4 C)-99.3 F (37.4 C)] 97.6 F (36.4 C) (01/13 0807) Pulse Rate:  [52-127] 88 (01/13 0600) Resp:  [13-29] 22 (01/13 0600) BP: (82-164)/(56-126) 128/100 mmHg (01/13 0600) SpO2:  [90 %-99 %] 97 % (01/13 0600) Weight:  [199 lb 15.3 oz (90.7 kg)] 199 lb 15.3 oz (90.7 kg) (01/13 0351) HEMODYNAMICS:   VENTILATOR SETTINGS:   INTAKE / OUTPUT: Intake/Output      01/12 0701 - 01/13 0700 01/13 0701 - 01/14 0700   I.V. (mL/kg) 1592.6 (17.6)    NG/GT     IV Piggyback 300    Total Intake(mL/kg) 1892.6 (20.9)    Urine (mL/kg/hr) 1475 (0.7)    Emesis/NG output 50 (0)    Total Output 1525     Net +367.6            PHYSICAL EXAMINATION: General: NAD, in wrist restraints Neuro: Alert, difficulty with word finding  HEENT:  Normocephalic  Cardiovascular:  Normal rate and rhythm  Lungs: Clear to auscultation anteriorly  Abdomen:  Mildly lower abdominal tenderness, non-distended, normal BS Musculoskeletal: b/l SCD's  Skin:  No rashes  LABS:  CBC  Recent  Labs Lab 09/26/15 0917 09/26/15 1454 09/27/15 0730  WBC 12.5* 13.8* 12.4*  HGB 10.7* 9.6* 8.3*  HCT 34.5* 30.5* 25.4*  PLT 333 327 227   Coag's  Recent Labs Lab 09/26/15 1454  APTT 30  INR 1.17   BMET  Recent Labs Lab 09/26/15 0917 09/26/15 1454 09/27/15 0600  NA 133* 135 137  K 5.6* 5.8* 4.0  CL 97* 107 110  CO2 22 20* 23  BUN 42* 37* 28*  CREATININE 2.97* 2.05* 1.45*  GLUCOSE 132* 152* 125*   Electrolytes  Recent Labs Lab 09/26/15 0917 09/26/15 1454 09/27/15 0600  CALCIUM 8.8* 7.4* 7.8*  MG  --  1.5*  --   PHOS  --  3.5  --    Sepsis Markers  Recent Labs Lab 09/26/15 0933 09/26/15 1454 09/27/15 0600  LATICACIDVEN 3.44* 2.0  --   PROCALCITON  --  5.92 3.79   ABG  Recent Labs Lab 09/26/15 1010 09/26/15 1251 09/27/15 0431  PHART 7.254* 7.264* 7.527*  PCO2ART 51.9* 33.8* 24.0*  PO2ART 68.0* 86.0 111.0*   Liver Enzymes  Recent Labs Lab 09/26/15 0917 09/26/15 1454  AST 22 41  ALT 11* 21  ALKPHOS 111 87  BILITOT 0.6 0.8  ALBUMIN 3.3* 2.5*   Cardiac Enzymes No results for input(s): TROPONINI, PROBNP in the last 168 hours. Glucose  Recent Labs Lab 09/27/15 1149  09/27/15 1545 09/27/15 1949 09/27/15 2351 09/28/15 0354 09/28/15 0807  GLUCAP 83 117* 121* 106* 92 104*    Imaging Dg Chest Port 1 View  09/27/2015  CLINICAL DATA:  Respiratory failure, sepsis, current smoker. EXAM: PORTABLE CHEST 1 VIEW COMPARISON:  Portable chest x-ray of July 26, 2016 FINDINGS: The lungs are mildly hypoinflated. The retrocardiac region on the left remains dense in the left hemidiaphragm obscured. The cardiac silhouette is mildly enlarged. The central pulmonary vascularity is mildly prominent. The endotracheal tube tip projects 3.2 cm above the carina. The left internal jugular venous catheter tip projects over the proximal SVC. The esophagogastric tube tip projects below the inferior margin of the image. IMPRESSION: Allowing for differences in  positioning there has not been dramatic interval change since yesterday's study. There is persistent left lower lobe atelectasis or pneumonia with small left pleural effusion. Mild right basilar atelectasis is suspected as well. The support tubes are in reasonable position. Electronically Signed   By: David  Martinique M.D.   On: 09/27/2015 07:24   Dg Chest Port 1 View  09/26/2015  CLINICAL DATA:  Central line infection.  ETT placement. EXAM: PORTABLE CHEST 1 VIEW COMPARISON:  08/14/2015 FINDINGS: There is an endotracheal tube with the tip 2.4 cm above the carina. There is a nasogastric tube coursing below the diaphragm. There is a left-sided central venous catheter with the tip at the confluence of the brachiocephalic vein and SVC. Small left pleural effusion. Left basilar airspace disease likely reflecting atelectasis versus pneumonia. There is no pneumothorax. The heart and mediastinal contours are unremarkable. Left shoulder arthroplasty. IMPRESSION: 1. Endotracheal tube with the tip 2.4 cm above the carina. 2. Small left pleural effusion with left basilar airspace disease which may reflect atelectasis versus pneumonia. Electronically Signed   By: Kathreen Devoid   On: 09/26/2015 12:44   Dg Chest Portable 1 View  09/26/2015  CLINICAL DATA:  Status post exchange of endotracheal tube. EXAM: PORTABLE CHEST 1 VIEW COMPARISON:  Single view of the chest earlier today. FINDINGS: Endotracheal tube is in place with the tip in good position at the level of the clavicular heads. NG tube is in the stomach. Airspace disease in left mid and lower lung zones is unchanged. No new abnormality. IMPRESSION: ET tube is in good position.  No other change. Electronically Signed   By: Inge Rise M.D.   On: 09/26/2015 11:53   Dg Chest Portable 1 View  09/26/2015  CLINICAL DATA:  Intubation, altered mental status, hypertension, smoker EXAM: PORTABLE CHEST 1 VIEW COMPARISON:  Portable exam 1047 hours compared to 09/26/2015  FINDINGS: Tip of endotracheal tube projects 2.5 cm above carina. Nasogastric tube coiled in stomach. Spinal stimulator leads project over mid chest. Minimal enlargement of cardiac silhouette. Atherosclerotic calcification aorta. Low lung volumes with LEFT perihilar and lower lobe infiltrate, questionably involving lingula as well. Mild RIGHT basilar atelectasis. No gross pleural effusion or pneumothorax. Bones demineralized with note of old RIGHT clavicular fracture and LEFT shoulder arthroplasty. IMPRESSION: Persistent LEFT perihilar and lower lobe infiltrates question pneumonia. Minimal RIGHT basilar atelectasis. Low lung volumes. Electronically Signed   By: Lavonia Dana M.D.   On: 09/26/2015 11:02   Dg Chest Portable 1 View  09/26/2015  CLINICAL DATA:  Hypoxia.  Altered mental status. EXAM: PORTABLE CHEST 1 VIEW COMPARISON:  PA and lateral chest 08/14/2015 and 05/15/2015. CT chest 06/08/2015. FINDINGS: Patchy airspace disease is seen in the lingula. The right lung is clear. Heart size is normal. No pneumothorax  or pleural effusion. Spinal stimulator and left shoulder replacement are noted. The patient has remote right clavicle fracture. IMPRESSION: Patchy airspace disease in the lingula worrisome for pneumonia. Electronically Signed   By: Inge Rise M.D.   On: 09/26/2015 09:40      ASSESSMENT / PLAN:   PULMONARY A: VDRF due to narcotic overdose, extubated on 1/12 Aspiration Pneumonia Tobacco Abuse P:  Oxygen therapy to keep SpO2 >92% See ID Nicotine patch daily Counsel on cessation   CARDIOVASCULAR A:  Hypotension resolved History of hypertension  P:  Hold home BP meds lasix, lotensin, lopressor, and amlodipine  Monitor BP, goal <140/90  RENAL A:  Non-oliguric AKI, pre-renal vs ATN in setting of hypotension, Cr improving  Urinary retention in setting of BPH Hypomagnesemia  P:  NS 50 mL/hr until PO intake Repeat BMP and Mg in AM Continue home flomax May need  foley if >300cc urinary retention   GASTROINTESTINAL A:  H/o GERD, PUD Hematemesis? Nutrition Constipation P:  IV Protonix daily  Advance to clears Senokot-S BID  HEMATOLOGIC A:  Chronic Normocytic Anemia DVT PPx P:  Monitor CBC SCD's Needs outpatient colonoscopy   INFECTIOUS A:  Aspiration pneumonia PCT down to 1.27 Leukocytosis in setting of aspiration pneumonia  P:  Discontinue vanc and zosyn Start unasyn  Follow up cultures Check HIV and HCV  ENDOCRINE A:  Elevated fasting glucose P:  Continue to monitor  SSI if CBG>180  NEUROLOGIC A:  Chronic Narcotic use Agitation  Depression P:  Start 15 mg BID MS Contin  Start renally adjusted gabapentin  Baclofen 10 mg daily at bedtime  Wean precedex Haldol PRN agitation  Hold home celexa   Blakely Gluth  PGY-III IMTS [id number]   TODAY'S SUMMARY:   I have personally obtained a history, examined the patient, evaluated laboratory and imaging results, formulated the assessment and plan and placed orders. CRITICAL CARE: The patient is critically ill with multiple organ systems failure and requires high complexity decision making for assessment and support, frequent evaluation and titration of therapies, application of advanced monitoring technologies and extensive interpretation of multiple databases. Critical Care Time devoted to patient care services described in this note is  minutes.    Pulmonary and [place] Pager: [phone]  09/28/2015, 9:15 AM

## 2015-09-28 NOTE — Progress Notes (Signed)
Patient transferred to 2C02 by West Carbo, RN and Damita Dunnings, RN. No belongings at bedside.

## 2015-09-28 NOTE — Progress Notes (Signed)
Patient had not voided post foley removal, 3 bladder scans were done showing < 200, in and out catheter was done, 950 ml of urine out.

## 2015-09-29 DIAGNOSIS — J9601 Acute respiratory failure with hypoxia: Secondary | ICD-10-CM | POA: Diagnosis not present

## 2015-09-29 DIAGNOSIS — J690 Pneumonitis due to inhalation of food and vomit: Secondary | ICD-10-CM | POA: Diagnosis not present

## 2015-09-29 DIAGNOSIS — A4901 Methicillin susceptible Staphylococcus aureus infection, unspecified site: Secondary | ICD-10-CM | POA: Diagnosis not present

## 2015-09-29 DIAGNOSIS — A4101 Sepsis due to Methicillin susceptible Staphylococcus aureus: Principal | ICD-10-CM

## 2015-09-29 DIAGNOSIS — N179 Acute kidney failure, unspecified: Secondary | ICD-10-CM | POA: Diagnosis not present

## 2015-09-29 DIAGNOSIS — K920 Hematemesis: Secondary | ICD-10-CM | POA: Diagnosis not present

## 2015-09-29 DIAGNOSIS — T40601D Poisoning by unspecified narcotics, accidental (unintentional), subsequent encounter: Secondary | ICD-10-CM | POA: Diagnosis not present

## 2015-09-29 DIAGNOSIS — G894 Chronic pain syndrome: Secondary | ICD-10-CM | POA: Diagnosis not present

## 2015-09-29 DIAGNOSIS — T40601S Poisoning by unspecified narcotics, accidental (unintentional), sequela: Secondary | ICD-10-CM

## 2015-09-29 LAB — BASIC METABOLIC PANEL
Anion gap: 11 (ref 5–15)
CHLORIDE: 105 mmol/L (ref 101–111)
CO2: 24 mmol/L (ref 22–32)
CREATININE: 0.73 mg/dL (ref 0.61–1.24)
Calcium: 8.4 mg/dL — ABNORMAL LOW (ref 8.9–10.3)
GFR calc non Af Amer: >60 mL/min (ref >60)
Glucose, Bld: 91 mg/dL (ref 65–99)
POTASSIUM: 2.9 mmol/L — AB (ref 3.5–5.1)
Sodium: 140 mmol/L (ref 135–145)

## 2015-09-29 LAB — CULTURE, BLOOD (ROUTINE X 2)

## 2015-09-29 LAB — CBC WITH DIFFERENTIAL/PLATELET
BASOS ABS: 0 10*3/uL (ref 0.0–0.1)
BASOS PCT: 0 %
Eosinophils Absolute: 0 10*3/uL (ref 0.0–0.7)
Eosinophils Relative: 0 %
HEMATOCRIT: 25.6 % — AB (ref 39.0–52.0)
HEMOGLOBIN: 8.5 g/dL — AB (ref 13.0–17.0)
LYMPHS PCT: 14 %
Lymphs Abs: 1.5 10*3/uL (ref 0.7–4.0)
MCH: 28.1 pg (ref 26.0–34.0)
MCHC: 33.2 g/dL (ref 30.0–36.0)
MCV: 84.8 fL (ref 78.0–100.0)
MONO ABS: 0.7 10*3/uL (ref 0.1–1.0)
Monocytes Relative: 7 %
NEUTROS ABS: 8.1 10*3/uL — AB (ref 1.7–7.7)
NEUTROS PCT: 79 %
Platelets: 242 10*3/uL (ref 150–400)
RBC: 3.02 MIL/uL — ABNORMAL LOW (ref 4.22–5.81)
RDW: 14.5 % (ref 11.5–15.5)
WBC: 10.3 10*3/uL (ref 4.0–10.5)

## 2015-09-29 LAB — OCCULT BLOOD X 1 CARD TO LAB, STOOL: Fecal Occult Bld: NEGATIVE

## 2015-09-29 LAB — GLUCOSE, CAPILLARY
GLUCOSE-CAPILLARY: 96 mg/dL (ref 65–99)
GLUCOSE-CAPILLARY: 115 mg/dL — AB (ref 65–99)
GLUCOSE-CAPILLARY: 88 mg/dL (ref 65–99)
Glucose-Capillary: 105 mg/dL — ABNORMAL HIGH (ref 65–99)
Glucose-Capillary: 93 mg/dL (ref 65–99)
Glucose-Capillary: 83 mg/dL (ref 65–99)

## 2015-09-29 LAB — MAGNESIUM: Magnesium: 1.2 mg/dL — ABNORMAL LOW (ref 1.7–2.4)

## 2015-09-29 LAB — HIV ANTIBODY (ROUTINE TESTING W REFLEX): HIV SCREEN 4TH GENERATION: NONREACTIVE

## 2015-09-29 LAB — PROCALCITONIN: Procalcitonin: 0.77 ng/mL

## 2015-09-29 MED ORDER — SODIUM CHLORIDE 0.9 % IV SOLN
3.0000 g | Freq: Four times a day (QID) | INTRAVENOUS | Status: DC
  Administered 2015-09-29 – 2015-10-02 (×11): 3 g via INTRAVENOUS
  Filled 2015-09-29 (×15): qty 3

## 2015-09-29 MED ORDER — MAGNESIUM SULFATE 4 GM/100ML IV SOLN
4.0000 g | Freq: Once | INTRAVENOUS | Status: AC
  Administered 2015-09-29: 4 g via INTRAVENOUS
  Filled 2015-09-29: qty 100

## 2015-09-29 MED ORDER — NICOTINE 7 MG/24HR TD PT24
7.0000 mg | MEDICATED_PATCH | Freq: Every day | TRANSDERMAL | Status: DC
  Administered 2015-09-29 – 2015-10-02 (×4): 7 mg via TRANSDERMAL
  Filled 2015-09-29 (×4): qty 1

## 2015-09-29 MED ORDER — POTASSIUM CHLORIDE CRYS ER 20 MEQ PO TBCR
40.0000 meq | EXTENDED_RELEASE_TABLET | ORAL | Status: AC
  Administered 2015-09-29 (×2): 40 meq via ORAL
  Filled 2015-09-29 (×2): qty 2

## 2015-09-29 MED ORDER — FENTANYL CITRATE (PF) 100 MCG/2ML IJ SOLN
25.0000 ug | Freq: Once | INTRAMUSCULAR | Status: AC
  Administered 2015-09-29: 25 ug via INTRAVENOUS
  Filled 2015-09-29: qty 2

## 2015-09-29 NOTE — Progress Notes (Signed)
Pt transferred to 6 East bed 21 per bed Accompanied by NT and RN with all belongings Wife aware of pending transfer to another floor.

## 2015-09-29 NOTE — Progress Notes (Signed)
eLink Physician-Brief Progress Note Patient Name: William Jordan DOB: [birth year] MRN: [id number]   Date of Service  09/29/2015  HPI/Events of Note  Hypokalemia and hypomag  eICU Interventions  Potassium and Mag replaced     Intervention Category Intermediate Interventions: Electrolyte abnormality - evaluation and management  Suzanne Garbers 09/29/2015, 4:51 AM

## 2015-09-29 NOTE — Progress Notes (Signed)
Called report o 6 [place] - spoke to  [employer] will go to bed 21.

## 2015-09-29 NOTE — Progress Notes (Signed)
Patient ID: William Jordan, male   DOB: August 14, 1946, 70 y.o.   MRN: [id number]         [facility] for Infectious Disease    Date of Admission:  09/26/2015   Total days of antibiotics 4          Principal Problem:   Staphylococcus aureus bacteremia with sepsis [facility]) Active Problems:   Aspiration pneumonia (HCC)   Narcotic overdose   Respiratory failure ([place])   Acute respiratory failure with hypoxia (HCC)   Hematemesis   . ampicillin-sulbactam (UNASYN) IV  3 g Intravenous Q6H  . antiseptic oral rinse  7 mL Mouth Rinse BID  . baclofen  10 mg Oral QHS  . gabapentin  600 mg Oral BID  . morphine  15 mg Oral Q12H  . nicotine  7 mg Transdermal Daily  . senna-docusate  1 tablet Oral BID  . tamsulosin  0.4 mg Oral Daily    SUBJECTIVE: He is feeling much better. He has little recall of the events surrounding his admission. He has been up walking in the hallway today.  Review of Systems: Review of Systems  Unable to perform ROS: mental acuity    Past Medical History  Diagnosis Date  . Hypertension   . Anxiety     takes Celexa  . Enlarged prostate   . Neuropathy ([place])   . Arthritis   . History of blood transfusion   . Chronic back pain     Social History  Substance Use Topics  . Smoking status: Current Every Day Smoker    Types: E-cigarettes  . Smokeless tobacco: Former Systems developer    Types: Snuff     Comment: does not use any tobacco products any more  . Alcohol Use: No    Family History  Problem Relation Age of Onset  . Heart disease Father    No Known Allergies  OBJECTIVE: Filed Vitals:   09/29/15 0752 09/29/15 1203 09/29/15 1225 09/29/15 1300  BP: 159/88 174/94  160/97  Pulse: 62  60   Temp: 98.2 F (36.8 C)  97.7 F (36.5 C)   TempSrc: Oral  Oral   Resp: 23   20  Height:      Weight:      SpO2: 96%   97%   Body mass index is 34.19 kg/(m^2).  Physical Exam  Constitutional:  He is more alert but still slightly confused.  Cardiovascular: Normal rate  and regular rhythm.   No murmur heard. Pulmonary/Chest: Effort normal. He has no wheezes. He has rales.  Left base crackles  Skin: No rash noted.    Lab Results Lab Results  Component Value Date   WBC 10.3 09/29/2015   HGB 8.5* 09/29/2015   HCT 25.6* 09/29/2015   MCV 84.8 09/29/2015   PLT 242 09/29/2015    Lab Results  Component Value Date   CREATININE 0.73 09/29/2015   BUN <5* 09/29/2015   NA 140 09/29/2015   K 2.9* 09/29/2015   CL 105 09/29/2015   CO2 24 09/29/2015    Lab Results  Component Value Date   ALT 21 09/26/2015   AST 41 09/26/2015   ALKPHOS 87 09/26/2015   BILITOT 0.8 09/26/2015     Microbiology: Recent Results (from the past 240 hour(s))  Blood culture (routine x 2)     Status: None   Collection Time: 09/26/15  9:17 AM  Result Value Ref Range Status   Specimen Description BLOOD LEFT ANTECUBITAL  Final   Special  Requests BOTTLES DRAWN AEROBIC AND ANAEROBIC 6MLS  Final   Culture  Setup Time   Final    GRAM POSITIVE COCCI IN CLUSTERS AEROBIC BOTTLE ONLY CRITICAL RESULT CALLED TO, READ BACK BY AND VERIFIED WITH: ROBIN,RN @0441  09/27/15 MKELLY    Culture STAPHYLOCOCCUS AUREUS  Final   Report Status 09/29/2015 FINAL  Final   Organism ID, Bacteria STAPHYLOCOCCUS AUREUS  Final      Susceptibility   Staphylococcus aureus - MIC*    CIPROFLOXACIN <=0.5 SENSITIVE Sensitive     ERYTHROMYCIN <=0.25 SENSITIVE Sensitive     GENTAMICIN <=0.5 SENSITIVE Sensitive     OXACILLIN 0.5 SENSITIVE Sensitive     TETRACYCLINE <=1 SENSITIVE Sensitive     VANCOMYCIN <=0.5 SENSITIVE Sensitive     TRIMETH/SULFA <=10 SENSITIVE Sensitive     CLINDAMYCIN <=0.25 SENSITIVE Sensitive     RIFAMPIN <=0.5 SENSITIVE Sensitive     Inducible Clindamycin NEGATIVE Sensitive     * STAPHYLOCOCCUS AUREUS  Blood culture (routine x 2)     Status: None (Preliminary result)   Collection Time: 09/26/15 10:03 AM  Result Value Ref Range Status   Specimen Description BLOOD RIGHT ANTECUBITAL   Final   Special Requests BOTTLES DRAWN AEROBIC AND ANAEROBIC 6CC  Final   Culture NO GROWTH 3 DAYS  Final   Report Status PENDING  Incomplete  Urine culture     Status: None   Collection Time: 09/26/15 12:00 PM  Result Value Ref Range Status   Specimen Description URINE, CLEAN CATCH  Final   Special Requests NONE  Final   Culture NO GROWTH 1 DAY  Final   Report Status 09/27/2015 FINAL  Final  MRSA PCR Screening     Status: None   Collection Time: 09/26/15  1:22 PM  Result Value Ref Range Status   MRSA by PCR NEGATIVE NEGATIVE Final    Comment:        The GeneXpert MRSA Assay (FDA approved for NASAL specimens only), is one component of a comprehensive MRSA colonization surveillance program. It is not intended to diagnose MRSA infection nor to guide or monitor treatment for MRSA infections.   Culture, blood (routine x 2)     Status: None (Preliminary result)   Collection Time: 09/26/15  2:25 PM  Result Value Ref Range Status   Specimen Description BLOOD RIGHT HAND  Final   Special Requests BOTTLES DRAWN AEROBIC ONLY 2CC  Final   Culture NO GROWTH 3 DAYS  Final   Report Status PENDING  Incomplete  Culture, blood (routine x 2)     Status: None (Preliminary result)   Collection Time: 09/26/15  2:30 PM  Result Value Ref Range Status   Specimen Description BLOOD LEFT HAND  Final   Special Requests BOTTLES DRAWN AEROBIC ONLY 3CC  Final   Culture NO GROWTH 3 DAYS  Final   Report Status PENDING  Incomplete     ASSESSMENT: He has probable left lower lobe aspiration pneumonia and transient MSSA bacteremia in the setting of a drug overdose. He is improving. I would continue Unasyn alone for another 3 days then changed to IV cefazolin. Repeat blood cultures have been negative. He can have a PICC placed at any time. His transthoracic echocardiogram was of poor technical quality. He probably needs a TEE.  PLAN: 1. Continue Unasyn for 3 more days then switch to IV cefazolin 2. He can  have a PICC placed 3. We will follow-up on Monday  Michel Bickers, MD [facility] for Infectious  Disease [facility] [phone] pager   [phone] cell 09/29/2015, 3:02 PM

## 2015-09-29 NOTE — Evaluation (Signed)
Clinical/Bedside Swallow Evaluation Patient Details  Name: William Jordan MRN: [id number] Date of Birth: January 11, 1946  Today's Date: 09/29/2015 Time: SLP Start Time (ACUTE ONLY): 46 SLP Stop Time (ACUTE ONLY): 1038 SLP Time Calculation (min) (ACUTE ONLY): 18 min  Past Medical History:  Past Medical History  Diagnosis Date  . Hypertension   . Anxiety     takes Celexa  . Enlarged prostate   . Neuropathy ([place])   . Arthritis   . History of blood transfusion   . Chronic back pain    Past Surgical History:  Past Surgical History  Procedure Laterality Date  . Joint replacement Left     knee, x 2  . Joint replacement Right     hip  . Joint replacement Left     partial shoulder  . Elbow surgery Right   . Hernia repair      umbilical  . Tonsillectomy    . Vasectomy    . Colonoscopy      one polyp removed - benign  . Spinal cord stimulator insertion N/A 09/28/2014    Procedure: LUMBAR SPINAL CORD STIMULATOR INSERTION;  Surgeon: Melina Schools, MD;  Location: [place];  Service: Orthopedics;  Laterality: N/A;   HPI:  Pt is a 70 yo male with recurrent episodes of pneumonia was found by family unresponsive and vomiting. He has hx of chronic pain, family states he gets sleepy and forgets how much medicine he has taken. He was brought to ER and was hypoxia. He required intubation. CXR showed pneumonia.Pt had witnessed aspiration event in ED per chart review.    Assessment / Plan / Recommendation Clinical Impression  Pts swallow appearing functional this date without overt signs or symptoms of reduced airway protection with any PO (though pt was not agreeable to multiple PO trials). Note episodic aspiration event in ED this visit, (nursing reports prior narcan administration and that the pt was more lethargic at that time). Pt does exhibit confusion which puts him at increased risk for reduced airway protection. Recommend continue regular thin diet with full supervision with all PO to ensure  aspiration precautions and safe swallow strategy implementation. ST to follow up x1 for diet tolerance.       Aspiration Risk  Mild aspiration risk    Diet Recommendation     Medication Administration: Whole meds with liquid    Other  Recommendations Oral Care Recommendations: Oral care BID   Follow up Recommendations  24 hour supervision/assistance    Frequency and Duration min 1 x/week  1 week       Prognosis Prognosis for Safe Diet Advancement: Good      Swallow Study   General Date of Onset: 09/26/15 HPI: Pt is a 70 yo male with recurrent episodes of pneumonia was found by family unresponsive and vomiting. He has hx of chronic pain, family states he gets sleepy and forgets how much medicine he has taken. He was brought to ER and was hypoxia. He required intubation. CXR showed pneumonia.Pt had witnessed aspiration event in ED per chart review.  Type of Study: Bedside Swallow Evaluation Previous Swallow Assessment: none on file Diet Prior to this Study: Regular;Thin liquids Temperature Spikes Noted: No Respiratory Status: Nasal cannula History of Recent Intubation: Yes Length of Intubations (days): 1 days Date extubated: 09/27/15 Behavior/Cognition: Confused;Alert Oral Cavity Assessment: Within Functional Limits Oral Cavity - Dentition: Adequate natural dentition Vision: Functional for self-feeding Self-Feeding Abilities: Needs assist Patient Positioning: Upright in bed Baseline Vocal Quality: Normal Volitional  Cough: Strong Volitional Swallow: Able to elicit    Oral/Motor/Sensory Function Overall Oral Motor/Sensory Function: Within functional limits   Ice Chips Ice chips: Within functional limits   Thin Liquid Thin Liquid: Within functional limits    Nectar Thick Nectar Thick Liquid: Not tested   Honey Thick Honey Thick Liquid: Not tested   Puree Puree: Within functional limits   Solid   GO   Solid: Within functional limits       Arvil Chaco MA,  CCC-SLP Acute Care Speech Language Pathologist    Arvil Chaco E 09/29/2015,10:47 AM

## 2015-09-29 NOTE — Progress Notes (Signed)
[facility] Progress Note   BARAN KLEMZ  [id number]  DOB: November 16, 1945  DOA: 09/26/2015 PCP: [name], MD  Brief narrative: William Jordan is a 70 y.o. male who was admitted to the hospital with acute resp failure suspected to be from accidental narcotic OD. He was found down in bloody vomitus per wife and was found to have aspiration pneumonia and subsequently was intubated on 1/11. He was extubated the following day. One set of blood cultures has grown MSSA.    Subjective: Appears a bit restless but is oriented. Has no complaints of cough, dyspnea, nausea, vomiting or diarrhea.   Assessment/Plan: Principal Problem:   Staphylococcus aureus bacteremia with sepsis - cont Unasyn to cover for this along with aspiration pneumonia - TTE was not adequate to rule out endocarditis - ID following   Active Problems:     Acute respiratory failure with hypoxia -  Aspiration pneumonia  - now extubated- wean O2 as able- cont Unasyn  Upper GI bleed?  - on Protonix- follow for recurrence   Acute encephalopathy - due to sepsis, OD on narcotics - cont to follow     Narcotic overdose/ chronic pain - resumed MS Contin at much lower dose of 15 mg BID- uses 60 mg BID at home - resumed Baclofen and Gabapentin  AKI  - suspected to be prerenal and now resolved.   Nicotine abuse - smoke E cigarettes - Nicotine patch    Antibiotics: Anti-infectives    Start     Dose/Rate Route Frequency Ordered Stop   09/28/15 1630  Ampicillin-Sulbactam (UNASYN) 3 g in sodium chloride 0.9 % 100 mL IVPB     3 g 100 mL/hr over 60 Minutes Intravenous Every 8 hours 09/28/15 1030     09/28/15 1300  vancomycin (VANCOCIN) IVPB 750 mg/150 ml premix  Status:  Discontinued     750 mg 150 mL/hr over 60 Minutes Intravenous Every 12 hours 09/28/15 1256 09/29/15 0756   09/27/15 1200  vancomycin (VANCOCIN) IVPB 1000 mg/200 mL premix  Status:  Discontinued     1,000 mg 200 mL/hr over 60 Minutes Intravenous Every  24 hours 09/26/15 1307 09/28/15 1030   09/26/15 1700  piperacillin-tazobactam (ZOSYN) IVPB 3.375 g  Status:  Discontinued     3.375 g 12.5 mL/hr over 240 Minutes Intravenous Every 8 hours 09/26/15 1233 09/28/15 1030   09/26/15 1100  vancomycin (VANCOCIN) 1,250 mg in sodium chloride 0.9 % 250 mL IVPB     1,250 mg 166.7 mL/hr over 90 Minutes Intravenous  Once 09/26/15 1054 09/26/15 1314   09/26/15 1100  piperacillin-tazobactam (ZOSYN) IVPB 3.375 g     3.375 g 100 mL/hr over 30 Minutes Intravenous  Once 09/26/15 1054 09/26/15 1150   09/26/15 1015  clindamycin (CLEOCIN) IVPB 900 mg  Status:  Discontinued     900 mg 100 mL/hr over 30 Minutes Intravenous  Once 09/26/15 1001 09/26/15 1054   09/26/15 1000  cefTRIAXone (ROCEPHIN) 1 g in dextrose 5 % 50 mL IVPB  Status:  Discontinued     1 g 100 mL/hr over 30 Minutes Intravenous  Once 09/26/15 0951 09/26/15 1054   09/26/15 1000  azithromycin (ZITHROMAX) 500 mg in dextrose 5 % 250 mL IVPB  Status:  Discontinued     500 mg 250 mL/hr over 60 Minutes Intravenous  Once 09/26/15 0951 09/26/15 1054     Code Status:     Code Status Orders        Start  Ordered   09/26/15 1201  Full code   Continuous     09/26/15 1204    Code Status History    Date Active Date Inactive Code Status Order ID Comments User Context   08/14/2015 11:16 PM 08/17/2015  6:25 PM Full Code [id number]  Allyne Gee, MD Inpatient   06/16/2015  3:19 AM 06/18/2015  9:56 PM Full Code [id number]  Theressa Millard, MD Inpatient   06/05/2015  8:17 PM 06/12/2015  6:36 PM Full Code [id number]  Kelvin Cellar, MD ED   09/28/2014 11:23 AM 09/29/2014  2:50 PM Full Code [id number]  Melina Schools, MD Inpatient     Family Communication:  Disposition Plan: await PT eval to determine if he can go home DVT prophylaxis: SCDs due to possible GI bleed Consultants: PCCM, ID Procedures:  ECHO    Objective: Filed Weights   09/28/15 0351 09/28/15 1837 09/29/15 0337  Weight: 90.7 kg (199 lb  15.3 oz) 91.2 kg (201 lb 1 oz) 93.2 kg (205 lb 7.5 oz)    Intake/Output Summary (Last 24 hours) at 09/29/15 1039 Last data filed at 09/29/15 0400  Gross per 24 hour  Intake 1352.89 ml  Output    800 ml  Net 552.89 ml     Vitals Filed Vitals:   09/29/15 0100 09/29/15 0337 09/29/15 0403 09/29/15 0752  BP: 168/89  169/86 159/88  Pulse: 70  82 62  Temp: 98 F (36.7 C)  98.7 F (37.1 C) 98.2 F (36.8 C)  TempSrc: Oral  Oral Oral  Resp: 24  28 23   Height:      Weight:  93.2 kg (205 lb 7.5 oz)    SpO2: 98%  96% 96%    Exam:  General:  Pt is alert, not in acute distress  HEENT: No icterus, No thrush, oral mucosa moist  Cardiovascular: regular rate and rhythm, S1/S2 No murmur  Respiratory: clear to auscultation bilaterally   Abdomen: Soft, +Bowel sounds, non tender, non distended, no guarding  MSK: No cyanosis or clubbing- no pedal edema   Data Reviewed: Basic Metabolic Panel:  Recent Labs Lab 09/26/15 0917 09/26/15 1454 09/27/15 0600 09/29/15 0325  NA 133* 135 137 140  K 5.6* 5.8* 4.0 2.9*  CL 97* 107 110 105  CO2 22 20* 23 24  GLUCOSE 132* 152* 125* 91  BUN 42* 37* 28* <5*  CREATININE 2.97* 2.05* 1.45* 0.73  CALCIUM 8.8* 7.4* 7.8* 8.4*  MG  --  1.5*  --  1.2*  PHOS  --  3.5  --   --    Liver Function Tests:  Recent Labs Lab 09/26/15 0917 09/26/15 1454  AST 22 41  ALT 11* 21  ALKPHOS 111 87  BILITOT 0.6 0.8  PROT 6.7 5.2*  ALBUMIN 3.3* 2.5*   No results for input(s): LIPASE, AMYLASE in the last 168 hours.  Recent Labs Lab 09/26/15 1003  AMMONIA 35   CBC:  Recent Labs Lab 09/26/15 0917 09/26/15 1454 09/27/15 0730 09/29/15 0325  WBC 12.5* 13.8* 12.4* 10.3  NEUTROABS 11.4*  --   --  8.1*  HGB 10.7* 9.6* 8.3* 8.5*  HCT 34.5* 30.5* 25.4* 25.6*  MCV 88.0 85.9 83.8 84.8  PLT 333 327 227 242   Cardiac Enzymes: No results for input(s): CKTOTAL, CKMB, CKMBINDEX, TROPONINI in the last 168 hours. BNP (last 3 results) No results for  input(s): BNP in the last 8760 hours.  ProBNP (last 3 results) No results for input(s): PROBNP in the last  8760 hours.  CBG:  Recent Labs Lab 09/28/15 1550 09/28/15 2032 09/29/15 0104 09/29/15 0406 09/29/15 0750  GLUCAP 95 94 96 115* 105*    Recent Results (from the past 240 hour(s))  Blood culture (routine x 2)     Status: None   Collection Time: 09/26/15  9:17 AM  Result Value Ref Range Status   Specimen Description BLOOD LEFT ANTECUBITAL  Final   Special Requests BOTTLES DRAWN AEROBIC AND ANAEROBIC 6MLS  Final   Culture  Setup Time   Final    GRAM POSITIVE COCCI IN CLUSTERS AEROBIC BOTTLE ONLY CRITICAL RESULT CALLED TO, READ BACK BY AND VERIFIED WITH: ROBIN,RN @0441  09/27/15 MKELLY    Culture STAPHYLOCOCCUS AUREUS  Final   Report Status 09/29/2015 FINAL  Final   Organism ID, Bacteria STAPHYLOCOCCUS AUREUS  Final      Susceptibility   Staphylococcus aureus - MIC*    CIPROFLOXACIN <=0.5 SENSITIVE Sensitive     ERYTHROMYCIN <=0.25 SENSITIVE Sensitive     GENTAMICIN <=0.5 SENSITIVE Sensitive     OXACILLIN 0.5 SENSITIVE Sensitive     TETRACYCLINE <=1 SENSITIVE Sensitive     VANCOMYCIN <=0.5 SENSITIVE Sensitive     TRIMETH/SULFA <=10 SENSITIVE Sensitive     CLINDAMYCIN <=0.25 SENSITIVE Sensitive     RIFAMPIN <=0.5 SENSITIVE Sensitive     Inducible Clindamycin NEGATIVE Sensitive     * STAPHYLOCOCCUS AUREUS  Blood culture (routine x 2)     Status: None (Preliminary result)   Collection Time: 09/26/15 10:03 AM  Result Value Ref Range Status   Specimen Description BLOOD RIGHT ANTECUBITAL  Final   Special Requests BOTTLES DRAWN AEROBIC AND ANAEROBIC 6CC  Final   Culture NO GROWTH 2 DAYS  Final   Report Status PENDING  Incomplete  Urine culture     Status: None   Collection Time: 09/26/15 12:00 PM  Result Value Ref Range Status   Specimen Description URINE, CLEAN CATCH  Final   Special Requests NONE  Final   Culture NO GROWTH 1 DAY  Final   Report Status 09/27/2015  FINAL  Final  MRSA PCR Screening     Status: None   Collection Time: 09/26/15  1:22 PM  Result Value Ref Range Status   MRSA by PCR NEGATIVE NEGATIVE Final    Comment:        The GeneXpert MRSA Assay (FDA approved for NASAL specimens only), is one component of a comprehensive MRSA colonization surveillance program. It is not intended to diagnose MRSA infection nor to guide or monitor treatment for MRSA infections.   Culture, blood (routine x 2)     Status: None (Preliminary result)   Collection Time: 09/26/15  2:25 PM  Result Value Ref Range Status   Specimen Description BLOOD RIGHT HAND  Final   Special Requests BOTTLES DRAWN AEROBIC ONLY 2CC  Final   Culture NO GROWTH 2 DAYS  Final   Report Status PENDING  Incomplete  Culture, blood (routine x 2)     Status: None (Preliminary result)   Collection Time: 09/26/15  2:30 PM  Result Value Ref Range Status   Specimen Description BLOOD LEFT HAND  Final   Special Requests BOTTLES DRAWN AEROBIC ONLY 3CC  Final   Culture NO GROWTH 2 DAYS  Final   Report Status PENDING  Incomplete     Studies: No results found.  Scheduled Meds:  Scheduled Meds: . ampicillin-sulbactam (UNASYN) IV  3 g Intravenous Q8H  . antiseptic oral rinse  7 mL Mouth  Rinse BID  . baclofen  10 mg Oral QHS  . gabapentin  600 mg Oral BID  . morphine  15 mg Oral Q12H  . nicotine  7 mg Transdermal Daily  . senna-docusate  1 tablet Oral BID  . tamsulosin  0.4 mg Oral Daily   Continuous Infusions: . sodium chloride 50 mL/hr at 09/29/15 0100    Time spent on care of this patient: 35 min   [place], MD 09/29/2015, 10:39 AM  LOS: 3 days   [facility] Office  [phone] Pager - Text Page per [email or web address] If 7PM-7AM, please contact night-coverage [email or web address]

## 2015-09-30 DIAGNOSIS — A4101 Sepsis due to Methicillin susceptible Staphylococcus aureus: Secondary | ICD-10-CM | POA: Diagnosis not present

## 2015-09-30 DIAGNOSIS — J690 Pneumonitis due to inhalation of food and vomit: Secondary | ICD-10-CM | POA: Diagnosis not present

## 2015-09-30 DIAGNOSIS — K920 Hematemesis: Secondary | ICD-10-CM | POA: Diagnosis not present

## 2015-09-30 DIAGNOSIS — J9601 Acute respiratory failure with hypoxia: Secondary | ICD-10-CM | POA: Diagnosis not present

## 2015-09-30 DIAGNOSIS — N179 Acute kidney failure, unspecified: Secondary | ICD-10-CM | POA: Diagnosis not present

## 2015-09-30 DIAGNOSIS — G894 Chronic pain syndrome: Secondary | ICD-10-CM | POA: Diagnosis not present

## 2015-09-30 LAB — BASIC METABOLIC PANEL
ANION GAP: 16 — AB (ref 5–15)
BUN: <5 mg/dL — ABNORMAL LOW (ref 6–20)
CALCIUM: 9 mg/dL (ref 8.9–10.3)
CO2: 25 mmol/L (ref 22–32)
Chloride: 99 mmol/L — ABNORMAL LOW (ref 101–111)
Creatinine, Ser: 0.75 mg/dL (ref 0.61–1.24)
GFR calc non Af Amer: >60 mL/min (ref >60)
Glucose, Bld: 96 mg/dL (ref 65–99)
Potassium: 3.1 mmol/L — ABNORMAL LOW (ref 3.5–5.1)
Sodium: 140 mmol/L (ref 135–145)

## 2015-09-30 LAB — GLUCOSE, CAPILLARY
GLUCOSE-CAPILLARY: 91 mg/dL (ref 65–99)
GLUCOSE-CAPILLARY: 103 mg/dL — AB (ref 65–99)
GLUCOSE-CAPILLARY: 90 mg/dL (ref 65–99)
Glucose-Capillary: 105 mg/dL — ABNORMAL HIGH (ref 65–99)
Glucose-Capillary: 83 mg/dL (ref 65–99)
Glucose-Capillary: 87 mg/dL (ref 65–99)

## 2015-09-30 LAB — HCV COMMENT:

## 2015-09-30 LAB — HEPATITIS C ANTIBODY (REFLEX): HCV Ab: 0.2 s/co ratio (ref 0.0–0.9)

## 2015-09-30 MED ORDER — MAGNESIUM SULFATE 2 GM/50ML IV SOLN
2.0000 g | Freq: Once | INTRAVENOUS | Status: AC
  Administered 2015-09-30: 2 g via INTRAVENOUS
  Filled 2015-09-30: qty 50

## 2015-09-30 MED ORDER — HYDRALAZINE HCL 20 MG/ML IJ SOLN
5.0000 mg | Freq: Once | INTRAMUSCULAR | Status: AC
  Administered 2015-09-30: 5 mg via INTRAVENOUS
  Filled 2015-09-30: qty 1

## 2015-09-30 NOTE — Progress Notes (Signed)
Patient's Bp 178/81. On call NP notified. 5mg  of Hydralazine ordered and implemented. Patient's Bp rechecked: 177/88. MD notified. Awaiting orders. RN will inform Public house manager.   Ermalinda Memos, RN

## 2015-09-30 NOTE — Progress Notes (Signed)
RN spoke with patient's wife on the phone. She expressed her concern about patient receiving Morphine for chronic pain. Patient has been receiving medication since 09/28/2015. Patient's wife thinks that the patient is allergic to Morphine. Wife asked if she would prefer RN to hold medication for the night until she speaks to the MD. Wife expressed that RN should do what is best for patient. RN notifies on call NP. On call NP recommended for RN to administer medication for the night. Patient shows no signs of shortness of breath, difficulty swallowing, itching or hives. RN will continue to monitor patient.   Ermalinda Memos, RN

## 2015-09-30 NOTE — Progress Notes (Signed)
[facility] Progress Note   VAHIN DISCHER  [id number]  DOB: 1946/01/22  DOA: 09/26/2015 PCP: [name], MD  Brief narrative: William Jordan is a 70 y.o. male who was admitted to the hospital with acute resp failure suspected to be from accidental narcotic OD. He was found down in bloody vomitus per wife and was found to have aspiration pneumonia and subsequently was intubated on 1/11. He was extubated the following day. One set of blood cultures has grown MSSA.    Subjective: No nausea, vomiting, abdominal pain or diarrhea. Mild cough.   Assessment/Plan: Principal Problem:   Staphylococcus aureus bacteremia with sepsis - cont Unasyn to cover for this along with aspiration pneumonia - TTE was not adequate to rule out endocarditis - ID following   Active Problems:     Acute respiratory failure with hypoxia -  Aspiration pneumonia  - now extubated and on room air - cont Unasyn  Upper GI bleed?  - on Protonix- follow for recurrence   Acute encephalopathy - due to sepsis, OD on narcotics - cont to follow     Narcotic overdose/ chronic pain - resumed MS Contin at much lower dose of 15 mg BID- uses 60 mg BID at home - resumed Baclofen and Gabapentin  AKI  - suspected to be prerenal and now resolved.   Nicotine abuse - smoke E cigarettes - Nicotine patch    Antibiotics: Anti-infectives    Start     Dose/Rate Route Frequency Ordered Stop   09/29/15 1600  Ampicillin-Sulbactam (UNASYN) 3 g in sodium chloride 0.9 % 100 mL IVPB     3 g 100 mL/hr over 60 Minutes Intravenous Every 6 hours 09/29/15 1101     09/28/15 1630  Ampicillin-Sulbactam (UNASYN) 3 g in sodium chloride 0.9 % 100 mL IVPB  Status:  Discontinued     3 g 100 mL/hr over 60 Minutes Intravenous Every 8 hours 09/28/15 1030 09/29/15 1101   09/28/15 1300  vancomycin (VANCOCIN) IVPB 750 mg/150 ml premix  Status:  Discontinued     750 mg 150 mL/hr over 60 Minutes Intravenous Every 12 hours 09/28/15 1256  09/29/15 0756   09/27/15 1200  vancomycin (VANCOCIN) IVPB 1000 mg/200 mL premix  Status:  Discontinued     1,000 mg 200 mL/hr over 60 Minutes Intravenous Every 24 hours 09/26/15 1307 09/28/15 1030   09/26/15 1700  piperacillin-tazobactam (ZOSYN) IVPB 3.375 g  Status:  Discontinued     3.375 g 12.5 mL/hr over 240 Minutes Intravenous Every 8 hours 09/26/15 1233 09/28/15 1030   09/26/15 1100  vancomycin (VANCOCIN) 1,250 mg in sodium chloride 0.9 % 250 mL IVPB     1,250 mg 166.7 mL/hr over 90 Minutes Intravenous  Once 09/26/15 1054 09/26/15 1314   09/26/15 1100  piperacillin-tazobactam (ZOSYN) IVPB 3.375 g     3.375 g 100 mL/hr over 30 Minutes Intravenous  Once 09/26/15 1054 09/26/15 1150   09/26/15 1015  clindamycin (CLEOCIN) IVPB 900 mg  Status:  Discontinued     900 mg 100 mL/hr over 30 Minutes Intravenous  Once 09/26/15 1001 09/26/15 1054   09/26/15 1000  cefTRIAXone (ROCEPHIN) 1 g in dextrose 5 % 50 mL IVPB  Status:  Discontinued     1 g 100 mL/hr over 30 Minutes Intravenous  Once 09/26/15 0951 09/26/15 1054   09/26/15 1000  azithromycin (ZITHROMAX) 500 mg in dextrose 5 % 250 mL IVPB  Status:  Discontinued     500 mg 250 mL/hr over 60  Minutes Intravenous  Once 09/26/15 0951 09/26/15 1054     Code Status:     Code Status Orders        Start     Ordered   09/26/15 1201  Full code   Continuous     09/26/15 1204    Code Status History    Date Active Date Inactive Code Status Order ID Comments User Context   08/14/2015 11:16 PM 08/17/2015  6:25 PM Full Code [id number]  Allyne Gee, MD Inpatient   06/16/2015  3:19 AM 06/18/2015  9:56 PM Full Code [id number]  Theressa Millard, MD Inpatient   06/05/2015  8:17 PM 06/12/2015  6:36 PM Full Code [id number]  Kelvin Cellar, MD ED   09/28/2014 11:23 AM 09/29/2014  2:50 PM Full Code [id number]  Melina Schools, MD Inpatient     Family Communication:  Disposition Plan: await PT eval to determine if he can go home DVT prophylaxis: SCDs due to  possible GI bleed Consultants: PCCM, ID Procedures:  ECHO    Objective: Filed Weights   09/28/15 1837 09/29/15 0337 09/29/15 2003  Weight: 91.2 kg (201 lb 1 oz) 93.2 kg (205 lb 7.5 oz) 89.359 kg (197 lb)    Intake/Output Summary (Last 24 hours) at 09/30/15 1116 Last data filed at 09/30/15 1046  Gross per 24 hour  Intake    470 ml  Output   3150 ml  Net  -2680 ml     Vitals Filed Vitals:   09/30/15 0447 09/30/15 0502 09/30/15 0659 09/30/15 0841  BP: 182/86 178/81 177/88 179/99  Pulse: 61 68  78  Temp: 98 F (36.7 C)   97.7 F (36.5 C)  TempSrc:    Oral  Resp: 21   18  Height:      Weight:      SpO2: 97%   97%    Exam:  General:  Pt is alert, not in acute distress  HEENT: No icterus, No thrush, oral mucosa moist  Cardiovascular: regular rate and rhythm, S1/S2 No murmur  Respiratory: clear to auscultation bilaterally   Abdomen: Soft, +Bowel sounds, non tender, non distended, no guarding  MSK: No cyanosis or clubbing- no pedal edema   Data Reviewed: Basic Metabolic Panel:  Recent Labs Lab 09/26/15 0917 09/26/15 1454 09/27/15 0600 09/29/15 0325 09/30/15 0830  NA 133* 135 137 140 140  K 5.6* 5.8* 4.0 2.9* 3.1*  CL 97* 107 110 105 99*  CO2 22 20* 23 24 25   GLUCOSE 132* 152* 125* 91 96  BUN 42* 37* 28* <5* <5*  CREATININE 2.97* 2.05* 1.45* 0.73 0.75  CALCIUM 8.8* 7.4* 7.8* 8.4* 9.0  MG  --  1.5*  --  1.2*  --   PHOS  --  3.5  --   --   --    Liver Function Tests:  Recent Labs Lab 09/26/15 0917 09/26/15 1454  AST 22 41  ALT 11* 21  ALKPHOS 111 87  BILITOT 0.6 0.8  PROT 6.7 5.2*  ALBUMIN 3.3* 2.5*   No results for input(s): LIPASE, AMYLASE in the last 168 hours.  Recent Labs Lab 09/26/15 1003  AMMONIA 35   CBC:  Recent Labs Lab 09/26/15 0917 09/26/15 1454 09/27/15 0730 09/29/15 0325  WBC 12.5* 13.8* 12.4* 10.3  NEUTROABS 11.4*  --   --  8.1*  HGB 10.7* 9.6* 8.3* 8.5*  HCT 34.5* 30.5* 25.4* 25.6*  MCV 88.0 85.9 83.8 84.8   PLT 333 327 227 242  Cardiac Enzymes: No results for input(s): CKTOTAL, CKMB, CKMBINDEX, TROPONINI in the last 168 hours. BNP (last 3 results) No results for input(s): BNP in the last 8760 hours.  ProBNP (last 3 results) No results for input(s): PROBNP in the last 8760 hours.  CBG:  Recent Labs Lab 09/29/15 1629 09/29/15 2112 09/30/15 0024 09/30/15 0430 09/30/15 0744  GLUCAP 83 88 83 91 105*    Recent Results (from the past 240 hour(s))  Blood culture (routine x 2)     Status: None   Collection Time: 09/26/15  9:17 AM  Result Value Ref Range Status   Specimen Description BLOOD LEFT ANTECUBITAL  Final   Special Requests BOTTLES DRAWN AEROBIC AND ANAEROBIC 6MLS  Final   Culture  Setup Time   Final    GRAM POSITIVE COCCI IN CLUSTERS AEROBIC BOTTLE ONLY CRITICAL RESULT CALLED TO, READ BACK BY AND VERIFIED WITH: ROBIN,RN @0441  09/27/15 MKELLY    Culture STAPHYLOCOCCUS AUREUS  Final   Report Status 09/29/2015 FINAL  Final   Organism ID, Bacteria STAPHYLOCOCCUS AUREUS  Final      Susceptibility   Staphylococcus aureus - MIC*    CIPROFLOXACIN <=0.5 SENSITIVE Sensitive     ERYTHROMYCIN <=0.25 SENSITIVE Sensitive     GENTAMICIN <=0.5 SENSITIVE Sensitive     OXACILLIN 0.5 SENSITIVE Sensitive     TETRACYCLINE <=1 SENSITIVE Sensitive     VANCOMYCIN <=0.5 SENSITIVE Sensitive     TRIMETH/SULFA <=10 SENSITIVE Sensitive     CLINDAMYCIN <=0.25 SENSITIVE Sensitive     RIFAMPIN <=0.5 SENSITIVE Sensitive     Inducible Clindamycin NEGATIVE Sensitive     * STAPHYLOCOCCUS AUREUS  Blood culture (routine x 2)     Status: None (Preliminary result)   Collection Time: 09/26/15 10:03 AM  Result Value Ref Range Status   Specimen Description BLOOD RIGHT ANTECUBITAL  Final   Special Requests BOTTLES DRAWN AEROBIC AND ANAEROBIC 6CC  Final   Culture NO GROWTH 3 DAYS  Final   Report Status PENDING  Incomplete  Urine culture     Status: None   Collection Time: 09/26/15 12:00 PM  Result Value  Ref Range Status   Specimen Description URINE, CLEAN CATCH  Final   Special Requests NONE  Final   Culture NO GROWTH 1 DAY  Final   Report Status 09/27/2015 FINAL  Final  MRSA PCR Screening     Status: None   Collection Time: 09/26/15  1:22 PM  Result Value Ref Range Status   MRSA by PCR NEGATIVE NEGATIVE Final    Comment:        The GeneXpert MRSA Assay (FDA approved for NASAL specimens only), is one component of a comprehensive MRSA colonization surveillance program. It is not intended to diagnose MRSA infection nor to guide or monitor treatment for MRSA infections.   Culture, blood (routine x 2)     Status: None (Preliminary result)   Collection Time: 09/26/15  2:25 PM  Result Value Ref Range Status   Specimen Description BLOOD RIGHT HAND  Final   Special Requests BOTTLES DRAWN AEROBIC ONLY 2CC  Final   Culture NO GROWTH 3 DAYS  Final   Report Status PENDING  Incomplete  Culture, blood (routine x 2)     Status: None (Preliminary result)   Collection Time: 09/26/15  2:30 PM  Result Value Ref Range Status   Specimen Description BLOOD LEFT HAND  Final   Special Requests BOTTLES DRAWN AEROBIC ONLY 3CC  Final   Culture NO GROWTH 3  DAYS  Final   Report Status PENDING  Incomplete     Studies: No results found.  Scheduled Meds:  Scheduled Meds: . ampicillin-sulbactam (UNASYN) IV  3 g Intravenous Q6H  . antiseptic oral rinse  7 mL Mouth Rinse BID  . baclofen  10 mg Oral QHS  . gabapentin  600 mg Oral BID  . magnesium sulfate 1 - 4 g bolus IVPB  2 g Intravenous Once  . morphine  15 mg Oral Q12H  . nicotine  7 mg Transdermal Daily  . senna-docusate  1 tablet Oral BID  . tamsulosin  0.4 mg Oral Daily   Continuous Infusions:    Time spent on care of this patient: 47 min   [place], MD 09/30/2015, 11:16 AM  LOS: 4 days   [facility] Office  [phone] Pager - Text Page per [email or web address] If 7PM-7AM, please contact night-coverage  [email or web address]

## 2015-09-30 NOTE — Progress Notes (Signed)
MD called this RN regarding BP. MD requested for nothing to be done until MD sees patient. RN will inform Public house manager.   Ermalinda Memos, RN

## 2015-10-01 ENCOUNTER — Encounter (HOSPITAL_COMMUNITY): Payer: Self-pay | Admitting: *Deleted

## 2015-10-01 DIAGNOSIS — R197 Diarrhea, unspecified: Secondary | ICD-10-CM

## 2015-10-01 DIAGNOSIS — G894 Chronic pain syndrome: Secondary | ICD-10-CM | POA: Diagnosis not present

## 2015-10-01 DIAGNOSIS — A4101 Sepsis due to Methicillin susceptible Staphylococcus aureus: Secondary | ICD-10-CM | POA: Diagnosis not present

## 2015-10-01 DIAGNOSIS — A4901 Methicillin susceptible Staphylococcus aureus infection, unspecified site: Secondary | ICD-10-CM | POA: Diagnosis not present

## 2015-10-01 DIAGNOSIS — J690 Pneumonitis due to inhalation of food and vomit: Secondary | ICD-10-CM | POA: Diagnosis not present

## 2015-10-01 DIAGNOSIS — N179 Acute kidney failure, unspecified: Secondary | ICD-10-CM | POA: Diagnosis not present

## 2015-10-01 DIAGNOSIS — K920 Hematemesis: Secondary | ICD-10-CM | POA: Diagnosis not present

## 2015-10-01 DIAGNOSIS — J9601 Acute respiratory failure with hypoxia: Secondary | ICD-10-CM | POA: Diagnosis not present

## 2015-10-01 LAB — BASIC METABOLIC PANEL
Anion gap: 12 (ref 5–15)
BUN: 6 mg/dL (ref 6–20)
CHLORIDE: 106 mmol/L (ref 101–111)
CO2: 22 mmol/L (ref 22–32)
Calcium: 8.7 mg/dL — ABNORMAL LOW (ref 8.9–10.3)
Creatinine, Ser: 0.76 mg/dL (ref 0.61–1.24)
GFR calc Af Amer: >60 mL/min (ref >60)
GLUCOSE: 97 mg/dL (ref 65–99)
POTASSIUM: 3.2 mmol/L — AB (ref 3.5–5.1)
Sodium: 140 mmol/L (ref 135–145)

## 2015-10-01 LAB — GLUCOSE, CAPILLARY
GLUCOSE-CAPILLARY: 109 mg/dL — AB (ref 65–99)
GLUCOSE-CAPILLARY: 109 mg/dL — AB (ref 65–99)
GLUCOSE-CAPILLARY: 112 mg/dL — AB (ref 65–99)
Glucose-Capillary: 79 mg/dL (ref 65–99)
Glucose-Capillary: 113 mg/dL — ABNORMAL HIGH (ref 65–99)

## 2015-10-01 LAB — CULTURE, BLOOD (ROUTINE X 2)
CULTURE: NO GROWTH
Culture: NO GROWTH
Culture: NO GROWTH

## 2015-10-01 LAB — MAGNESIUM
MAGNESIUM: 1.6 mg/dL — AB (ref 1.7–2.4)
Magnesium: 1.7 mg/dL (ref 1.7–2.4)

## 2015-10-01 MED ORDER — TAMSULOSIN HCL 0.4 MG PO CAPS: 0.4000 mg | ORAL_CAPSULE | Freq: Every day | ORAL | Status: DC

## 2015-10-01 MED ORDER — MAGNESIUM SULFATE 2 GM/50ML IV SOLN
2.0000 g | Freq: Once | INTRAVENOUS | Status: AC
  Administered 2015-10-01: 2 g via INTRAVENOUS
  Filled 2015-10-01: qty 50

## 2015-10-01 MED ORDER — DOCUSATE SODIUM 100 MG PO CAPS
200.0000 mg | ORAL_CAPSULE | Freq: Every day | ORAL | Status: DC
  Administered 2015-10-01: 200 mg via ORAL
  Filled 2015-10-01: qty 2

## 2015-10-01 MED ORDER — POTASSIUM CHLORIDE CRYS ER 20 MEQ PO TBCR
40.0000 meq | EXTENDED_RELEASE_TABLET | ORAL | Status: AC
  Administered 2015-10-01 (×2): 40 meq via ORAL
  Filled 2015-10-01 (×2): qty 2

## 2015-10-01 NOTE — Care Management Note (Addendum)
Case Management Note  Patient Details  Name: William Jordan MRN: [id number] Date of Birth: Oct 17, 1945  Subjective/Objective:                CM following for progression and d/c planning.    Action/Plan: 10/01/2015 Met with pt and wife, they selected [facility] for [facility] and [place] services. Oval Linsey will obtain IV antibiotics from [facility] . Will notify [facility] and await prescription.   Expected Discharge Date:                  Expected Discharge Plan:  [place] 10/01/15 Notified pt and wife of $25 copay for [facility] and [place] per visit.  In-House Referral:     Discharge planning Services  CM Consult  Post Acute Care Choice:  Durable Medical Equipment, Home Health Choice offered to:  Spouse  DME Arranged:  IV pump/equipment DME Agency:  [place] Arranged:  RN, PT [facility] Agency:  [facility]  Status of Service:  In process, will continue to follow  Medicare Important Message Given:  Yes Date Medicare IM Given:    Medicare IM give by:    Date Additional Medicare IM Given:    Additional Medicare Important Message give by:     If discussed at [place] of Stay Meetings, dates discussed:    Additional Comments:  Adron Bene, RN 10/01/2015, 11:51 AM

## 2015-10-01 NOTE — Care Management Important Message (Signed)
Important Message  Patient Details  Name: ELLERY WIDER MRN: [id number] Date of Birth: Oct 28, 1945   Medicare Important Message Given:  Yes    Linell Shawn, Rory Percy, RN 10/01/2015, 10:37 AM

## 2015-10-01 NOTE — Progress Notes (Signed)
PHYSICAL THERAPY EVALUATION  Clinical Impression: Pt presents with the above medical history. PTA pt was independent with ambulation and ADLs. Pt lives with very supportive wife. Today pt required Min A for ambulation in hallway w/o AD. No LOB but slightly unsteady and relying on IV pole and hall railings for support. Spoke with wife and pt about using RW at home for a short period until HHPT conducts a safety assessment and falls risk since pt has a history of falls in the home, pt and wife agreeable. Pt will benefit from continued skilled care to improve strength and balance for safety and independence.    10/01/15 1000  PT Visit Information  Last PT Received On 10/01/15  Assistance Needed +1  History of Present Illness 69 yo male with recurrent episodes of pneumonia was found by family unresponsive and vomiting. He has hx of chronic pain. He was brought to ER and was hypoxic. He  has a past medical history of Hypertension; Anxiety; Enlarged prostate; Neuropathy ([place]); Arthritis; History of blood transfusion; and Chronic back pain.  Precautions  Precautions Fall  Restrictions  Weight Bearing Restrictions No  Home Living  Family/patient expects to be discharged to: Private residence  Living Arrangements Spouse/significant other  Available Help at Discharge Family;Available 24 hours/day  Type of Home House  Home Access Stairs to enter  Entrance Stairs-Number of Steps 2 in front, 6 in back  Entrance Stairs-Rails Right;Left;Can reach both (can only reach 1 in the front)  Home Layout One level  Print production planner Handicapped height  Home Equipment Walker - 2 wheels;BSC  Prior Function  Level of Independence Independent  Comments drives, cooks  Communication  Communication No difficulties  Pain Assessment  Pain Assessment 0-10  Pain Score 8  Pain Location Left leg  Pain Descriptors / Indicators Burning  Pain Intervention(s) Monitored during  session;Limited activity within patient's tolerance  Cognition  Arousal/Alertness Awake/alert (but sleepy, just layed down)  Behavior During Therapy [facility] for tasks assessed/performed  Overall Cognitive Status Within Functional Limits for tasks assessed  Upper Extremity Assessment  Upper Extremity Assessment Overall WFL for tasks assessed  Lower Extremity Assessment  Lower Extremity Assessment Generalized weakness  RLE Sensation history of peripheral neuropathy  LLE Sensation history of peripheral neuropathy  Bed Mobility  Overal bed mobility Needs Assistance  Bed Mobility Supine to Sit  Supine to sit Mod assist;HOB elevated  General bed mobility comments Required Mod A and utilized pulling up on PT to achieve upright posture. May be due to pt being asleep prior to session  Transfers  Overall transfer level Modified independent  Equipment used None  General transfer comment Required increased time for transitions into standing but able to safety achieve full upright posture as well as return to seated following VC's  Ambulation/Gait  Ambulation/Gait assistance Min guard  Ambulation Distance (Feet) 100 Feet  Assistive device None (IV pole vs none)  Gait Pattern/deviations Step-through pattern;Decreased stride length;Drifts right/left  General Gait Details Pt requiring use of hall rail for balance and IV pole. Pt progressed towards more independence with ambulating but quickly fatigues and c/o increase in B leg pain  Gait velocity interpretation Below normal speed for age/gender  Balance  Overall balance assessment Needs assistance;History of Falls (wife reports two falls a month at home)  Sitting-balance support No upper extremity supported;Feet supported  Sitting balance-Leahy Scale Fair  Standing balance support No upper extremity supported  Standing balance-Leahy Scale Fair  Standing balance comment no  LOB but requires HHA/rail/IV pole while ambulating  PT - End of Session   Equipment Utilized During Treatment Gait belt  Activity Tolerance Patient tolerated treatment well;Patient limited by fatigue;Patient limited by pain  Patient left in bed;with call bell/phone within reach;with family/visitor present  Nurse Communication Mobility status  PT Assessment  PT Therapy Diagnosis  Difficulty walking;Generalized weakness  PT Recommendation/Assessment Patient needs continued PT services  PT Problem List Decreased strength;Decreased activity tolerance;Decreased balance;Decreased coordination;Impaired sensation  PT Plan  PT Frequency (ACUTE ONLY) Min 2X/week  PT Treatment/Interventions (ACUTE ONLY) Gait training;Stair training;Functional mobility training;Therapeutic activities;Balance training;DME instruction  PT Recommendation  Follow Up Recommendations Home health PT;Supervision - Intermittent  PT equipment None recommended by PT  Individuals Consulted  Consulted and Agree with Results and Recommendations Patient;Family member/caregiver  Family Member Consulted wife  Acute Rehab PT Goals  Patient Stated Goal get stronger  PT Goal Formulation With patient  Time For Goal Achievement 10/15/15  Potential to Achieve Goals Good  PT Time Calculation  PT Start Time (ACUTE ONLY) 1052  PT Stop Time (ACUTE ONLY) 1116  PT Time Calculation (min) (ACUTE ONLY) 24 min  PT General Charges  $$ ACUTE PT VISIT 1 Procedure  PT Evaluation  $PT Eval Low Complexity 1 Procedure  PT Treatments  $Gait Training 8-22 mins  Written Expression  Dominant Hand Right   William Jordan, Student Physical Therapist Acute Rehab [phone]

## 2015-10-01 NOTE — Progress Notes (Signed)
[facility] Progress Note   William Jordan  [id number]  DOB: 11-May-1946  DOA: 09/26/2015 PCP: [name], MD  Brief narrative: William Jordan is a 70 y.o. male who was admitted to the hospital with acute resp failure suspected to be from accidental narcotic OD. He was found down in bloody vomitus per wife and was found to have aspiration pneumonia and subsequently was intubated on 1/11. He was extubated the following day. One set of blood cultures has grown MSSA.    Subjective: No complaints. Had a long discussion with him and his wife today about his progress and his discharge plans.   Assessment/Plan: Principal Problem:   Staphylococcus aureus bacteremia with sepsis - cont Unasyn to cover for this along with aspiration pneumonia- can transition to Ancef after 1 wk total of Unasyn and complete a 14 day course - have ordered PICC line - TTE was not adequate to rule out endocarditis - ID following   Active Problems:     Acute respiratory failure with hypoxia -  Aspiration pneumonia  - now extubated and on room air - cont Unasyn  Upper GI bleed?  - on Protonix- no recurrence in the hospital  Acute encephalopathy - due to sepsis, OD on narcotics - cont to follow     Narcotic overdose/ chronic pain - resumed MS Contin at much lower dose of 15 mg BID- uses 60 mg BID at home - resumed Baclofen and Gabapentin  AKI  - suspected to be prerenal and now resolved.   Nicotine abuse - smoke E cigarettes - Nicotine patch    Antibiotics: Anti-infectives    Start     Dose/Rate Route Frequency Ordered Stop   09/29/15 1600  Ampicillin-Sulbactam (UNASYN) 3 g in sodium chloride 0.9 % 100 mL IVPB     3 g 100 mL/hr over 60 Minutes Intravenous Every 6 hours 09/29/15 1101     09/28/15 1630  Ampicillin-Sulbactam (UNASYN) 3 g in sodium chloride 0.9 % 100 mL IVPB  Status:  Discontinued     3 g 100 mL/hr over 60 Minutes Intravenous Every 8 hours 09/28/15 1030 09/29/15 1101   09/28/15  1300  vancomycin (VANCOCIN) IVPB 750 mg/150 ml premix  Status:  Discontinued     750 mg 150 mL/hr over 60 Minutes Intravenous Every 12 hours 09/28/15 1256 09/29/15 0756   09/27/15 1200  vancomycin (VANCOCIN) IVPB 1000 mg/200 mL premix  Status:  Discontinued     1,000 mg 200 mL/hr over 60 Minutes Intravenous Every 24 hours 09/26/15 1307 09/28/15 1030   09/26/15 1700  piperacillin-tazobactam (ZOSYN) IVPB 3.375 g  Status:  Discontinued     3.375 g 12.5 mL/hr over 240 Minutes Intravenous Every 8 hours 09/26/15 1233 09/28/15 1030   09/26/15 1100  vancomycin (VANCOCIN) 1,250 mg in sodium chloride 0.9 % 250 mL IVPB     1,250 mg 166.7 mL/hr over 90 Minutes Intravenous  Once 09/26/15 1054 09/26/15 1314   09/26/15 1100  piperacillin-tazobactam (ZOSYN) IVPB 3.375 g     3.375 g 100 mL/hr over 30 Minutes Intravenous  Once 09/26/15 1054 09/26/15 1150   09/26/15 1015  clindamycin (CLEOCIN) IVPB 900 mg  Status:  Discontinued     900 mg 100 mL/hr over 30 Minutes Intravenous  Once 09/26/15 1001 09/26/15 1054   09/26/15 1000  cefTRIAXone (ROCEPHIN) 1 g in dextrose 5 % 50 mL IVPB  Status:  Discontinued     1 g 100 mL/hr over 30 Minutes Intravenous  Once 09/26/15 0951  09/26/15 1054   09/26/15 1000  azithromycin (ZITHROMAX) 500 mg in dextrose 5 % 250 mL IVPB  Status:  Discontinued     500 mg 250 mL/hr over 60 Minutes Intravenous  Once 09/26/15 0951 09/26/15 1054     Code Status:     Code Status Orders        Start     Ordered   09/26/15 1201  Full code   Continuous     09/26/15 1204    Code Status History    Date Active Date Inactive Code Status Order ID Comments User Context   08/14/2015 11:16 PM 08/17/2015  6:25 PM Full Code [id number]  Allyne Gee, MD Inpatient   06/16/2015  3:19 AM 06/18/2015  9:56 PM Full Code [id number]  Theressa Millard, MD Inpatient   06/05/2015  8:17 PM 06/12/2015  6:36 PM Full Code [id number]  Kelvin Cellar, MD ED   09/28/2014 11:23 AM 09/29/2014  2:50 PM Full Code  [id number]  Melina Schools, MD Inpatient     Family Communication: wife  Disposition Plan: await PT eval to determine if he can go home DVT prophylaxis: SCDs due to possible GI bleed Consultants: PCCM, ID Procedures:  ECHO    Objective: Filed Weights   09/29/15 0337 09/29/15 2003 09/30/15 2044  Weight: 93.2 kg (205 lb 7.5 oz) 89.359 kg (197 lb) 89.5 kg (197 lb 5 oz)    Intake/Output Summary (Last 24 hours) at 10/01/15 1144 Last data filed at 10/01/15 0600  Gross per 24 hour  Intake    750 ml  Output    600 ml  Net    150 ml     Vitals Filed Vitals:   09/30/15 0841 09/30/15 1616 09/30/15 2044 10/01/15 0530  BP: 179/99 167/87 170/79 159/95  Pulse: 78 69 70 61  Temp: 97.7 F (36.5 C) 98.9 F (37.2 C) 98.8 F (37.1 C) 97.4 F (36.3 C)  TempSrc: Oral Oral Oral Oral  Resp: 18 17 18 18   Height:      Weight:   89.5 kg (197 lb 5 oz)   SpO2: 97% 96% 97% 96%    Exam:  General:  Pt is alert, not in acute distress  HEENT: No icterus, No thrush, oral mucosa moist  Cardiovascular: regular rate and rhythm, S1/S2 No murmur  Respiratory: clear to auscultation bilaterally   Abdomen: Soft, +Bowel sounds, non tender, non distended, no guarding  MSK: No cyanosis or clubbing- no pedal edema   Data Reviewed: Basic Metabolic Panel:  Recent Labs Lab 09/26/15 1454 09/27/15 0600 09/29/15 0325 09/30/15 0830 10/01/15 0615  NA 135 137 140 140 140  K 5.8* 4.0 2.9* 3.1* 3.2*  CL 107 110 105 99* 106  CO2 20* 23 24 25 22   GLUCOSE 152* 125* 91 96 97  BUN 37* 28* <5* <5* 6  CREATININE 2.05* 1.45* 0.73 0.75 0.76  CALCIUM 7.4* 7.8* 8.4* 9.0 8.7*  MG 1.5*  --  1.2*  --  1.6*  1.7  PHOS 3.5  --   --   --   --    Liver Function Tests:  Recent Labs Lab 09/26/15 0917 09/26/15 1454  AST 22 41  ALT 11* 21  ALKPHOS 111 87  BILITOT 0.6 0.8  PROT 6.7 5.2*  ALBUMIN 3.3* 2.5*   No results for input(s): LIPASE, AMYLASE in the last 168 hours.  Recent Labs Lab 09/26/15 1003   AMMONIA 35   CBC:  Recent Labs Lab 09/26/15 0917 09/26/15  1454 09/27/15 0730 09/29/15 0325  WBC 12.5* 13.8* 12.4* 10.3  NEUTROABS 11.4*  --   --  8.1*  HGB 10.7* 9.6* 8.3* 8.5*  HCT 34.5* 30.5* 25.4* 25.6*  MCV 88.0 85.9 83.8 84.8  PLT 333 327 227 242   Cardiac Enzymes: No results for input(s): CKTOTAL, CKMB, CKMBINDEX, TROPONINI in the last 168 hours. BNP (last 3 results) No results for input(s): BNP in the last 8760 hours.  ProBNP (last 3 results) No results for input(s): PROBNP in the last 8760 hours.  CBG:  Recent Labs Lab 09/30/15 0744 09/30/15 1144 09/30/15 1613 09/30/15 2028 10/01/15 0813  GLUCAP 105* 87 103* 90 79    Recent Results (from the past 240 hour(s))  Blood culture (routine x 2)     Status: None   Collection Time: 09/26/15  9:17 AM  Result Value Ref Range Status   Specimen Description BLOOD LEFT ANTECUBITAL  Final   Special Requests BOTTLES DRAWN AEROBIC AND ANAEROBIC 6MLS  Final   Culture  Setup Time   Final    GRAM POSITIVE COCCI IN CLUSTERS AEROBIC BOTTLE ONLY CRITICAL RESULT CALLED TO, READ BACK BY AND VERIFIED WITH: ROBIN,RN @0441  09/27/15 MKELLY    Culture STAPHYLOCOCCUS AUREUS  Final   Report Status 09/29/2015 FINAL  Final   Organism ID, Bacteria STAPHYLOCOCCUS AUREUS  Final      Susceptibility   Staphylococcus aureus - MIC*    CIPROFLOXACIN <=0.5 SENSITIVE Sensitive     ERYTHROMYCIN <=0.25 SENSITIVE Sensitive     GENTAMICIN <=0.5 SENSITIVE Sensitive     OXACILLIN 0.5 SENSITIVE Sensitive     TETRACYCLINE <=1 SENSITIVE Sensitive     VANCOMYCIN <=0.5 SENSITIVE Sensitive     TRIMETH/SULFA <=10 SENSITIVE Sensitive     CLINDAMYCIN <=0.25 SENSITIVE Sensitive     RIFAMPIN <=0.5 SENSITIVE Sensitive     Inducible Clindamycin NEGATIVE Sensitive     * STAPHYLOCOCCUS AUREUS  Blood culture (routine x 2)     Status: None   Collection Time: 09/26/15 10:03 AM  Result Value Ref Range Status   Specimen Description BLOOD RIGHT ANTECUBITAL   Final   Special Requests BOTTLES DRAWN AEROBIC AND ANAEROBIC 6CC  Final   Culture NO GROWTH 5 DAYS  Final   Report Status 10/01/2015 FINAL  Final  Urine culture     Status: None   Collection Time: 09/26/15 12:00 PM  Result Value Ref Range Status   Specimen Description URINE, CLEAN CATCH  Final   Special Requests NONE  Final   Culture NO GROWTH 1 DAY  Final   Report Status 09/27/2015 FINAL  Final  MRSA PCR Screening     Status: None   Collection Time: 09/26/15  1:22 PM  Result Value Ref Range Status   MRSA by PCR NEGATIVE NEGATIVE Final    Comment:        The GeneXpert MRSA Assay (FDA approved for NASAL specimens only), is one component of a comprehensive MRSA colonization surveillance program. It is not intended to diagnose MRSA infection nor to guide or monitor treatment for MRSA infections.   Culture, blood (routine x 2)     Status: None   Collection Time: 09/26/15  2:25 PM  Result Value Ref Range Status   Specimen Description BLOOD RIGHT HAND  Final   Special Requests BOTTLES DRAWN AEROBIC ONLY 2CC  Final   Culture NO GROWTH 5 DAYS  Final   Report Status 10/01/2015 FINAL  Final  Culture, blood (routine x 2)  Status: None   Collection Time: 09/26/15  2:30 PM  Result Value Ref Range Status   Specimen Description BLOOD LEFT HAND  Final   Special Requests BOTTLES DRAWN AEROBIC ONLY 3CC  Final   Culture NO GROWTH 5 DAYS  Final   Report Status 10/01/2015 FINAL  Final     Studies: No results found.  Scheduled Meds:  Scheduled Meds: . ampicillin-sulbactam (UNASYN) IV  3 g Intravenous Q6H  . antiseptic oral rinse  7 mL Mouth Rinse BID  . baclofen  10 mg Oral QHS  . docusate sodium  200 mg Oral Daily  . gabapentin  600 mg Oral BID  . magnesium sulfate 1 - 4 g bolus IVPB  2 g Intravenous Once  . morphine  15 mg Oral Q12H  . nicotine  7 mg Transdermal Daily  . potassium chloride  40 mEq Oral Q4H  . senna-docusate  1 tablet Oral BID  . tamsulosin  0.4 mg Oral Daily    Continuous Infusions:    Time spent on care of this patient: 31 min   [place], MD 10/01/2015, 11:44 AM  LOS: 5 days   [facility] Office  [phone] Pager - Text Page per [email or web address] If 7PM-7AM, please contact night-coverage [email or web address]

## 2015-10-01 NOTE — Progress Notes (Signed)
[facility] for Infectious Disease   Reason for visit: Follow up on MSSA bacteremia  Interval History: repeat blood cultures the same day are negative, just 1/2 positive earlier in the day. No fever, feels better.  Eating.   Physical Exam: Constitutional:  Filed Vitals:   10/01/15 0530 10/01/15 1152  BP: 159/95 178/93  Pulse: 61 68  Temp: 97.4 F (36.3 C) 97.3 F (36.3 C)  Resp: 18 20   patient appears in NAD Respiratory: Normal respiratory effort; CTA B Cardiovascular: RRR GI: soft, nt  Review of Systems: Constitutional: negative for fevers and chills Musculoskeletal: negative for myalgias and arthralgias  Lab Results  Component Value Date   WBC 10.3 09/29/2015   HGB 8.5* 09/29/2015   HCT 25.6* 09/29/2015   MCV 84.8 09/29/2015   PLT 242 09/29/2015    Lab Results  Component Value Date   CREATININE 0.76 10/01/2015   BUN 6 10/01/2015   NA 140 10/01/2015   K 3.2* 10/01/2015   CL 106 10/01/2015   CO2 22 10/01/2015    Lab Results  Component Value Date   ALT 21 09/26/2015   AST 41 09/26/2015   ALKPHOS 87 09/26/2015     Microbiology: Recent Results (from the past 240 hour(s))  Blood culture (routine x 2)     Status: None   Collection Time: 09/26/15  9:17 AM  Result Value Ref Range Status   Specimen Description BLOOD LEFT ANTECUBITAL  Final   Special Requests BOTTLES DRAWN AEROBIC AND ANAEROBIC 6MLS  Final   Culture  Setup Time   Final    GRAM POSITIVE COCCI IN CLUSTERS AEROBIC BOTTLE ONLY CRITICAL RESULT CALLED TO, READ BACK BY AND VERIFIED WITH: ROBIN,RN @0441  09/27/15 MKELLY    Culture STAPHYLOCOCCUS AUREUS  Final   Report Status 09/29/2015 FINAL  Final   Organism ID, Bacteria STAPHYLOCOCCUS AUREUS  Final      Susceptibility   Staphylococcus aureus - MIC*    CIPROFLOXACIN <=0.5 SENSITIVE Sensitive     ERYTHROMYCIN <=0.25 SENSITIVE Sensitive     GENTAMICIN <=0.5 SENSITIVE Sensitive     OXACILLIN 0.5 SENSITIVE Sensitive     TETRACYCLINE <=1  SENSITIVE Sensitive     VANCOMYCIN <=0.5 SENSITIVE Sensitive     TRIMETH/SULFA <=10 SENSITIVE Sensitive     CLINDAMYCIN <=0.25 SENSITIVE Sensitive     RIFAMPIN <=0.5 SENSITIVE Sensitive     Inducible Clindamycin NEGATIVE Sensitive     * STAPHYLOCOCCUS AUREUS  Blood culture (routine x 2)     Status: None   Collection Time: 09/26/15 10:03 AM  Result Value Ref Range Status   Specimen Description BLOOD RIGHT ANTECUBITAL  Final   Special Requests BOTTLES DRAWN AEROBIC AND ANAEROBIC 6CC  Final   Culture NO GROWTH 5 DAYS  Final   Report Status 10/01/2015 FINAL  Final  Urine culture     Status: None   Collection Time: 09/26/15 12:00 PM  Result Value Ref Range Status   Specimen Description URINE, CLEAN CATCH  Final   Special Requests NONE  Final   Culture NO GROWTH 1 DAY  Final   Report Status 09/27/2015 FINAL  Final  MRSA PCR Screening     Status: None   Collection Time: 09/26/15  1:22 PM  Result Value Ref Range Status   MRSA by PCR NEGATIVE NEGATIVE Final    Comment:        The GeneXpert MRSA Assay (FDA approved for NASAL specimens only), is one component of a comprehensive MRSA  colonization surveillance program. It is not intended to diagnose MRSA infection nor to guide or monitor treatment for MRSA infections.   Culture, blood (routine x 2)     Status: None   Collection Time: 09/26/15  2:25 PM  Result Value Ref Range Status   Specimen Description BLOOD RIGHT HAND  Final   Special Requests BOTTLES DRAWN AEROBIC ONLY 2CC  Final   Culture NO GROWTH 5 DAYS  Final   Report Status 10/01/2015 FINAL  Final  Culture, blood (routine x 2)     Status: None   Collection Time: 09/26/15  2:30 PM  Result Value Ref Range Status   Specimen Description BLOOD LEFT HAND  Final   Special Requests BOTTLES DRAWN AEROBIC ONLY 3CC  Final   Culture NO GROWTH 5 DAYS  Final   Report Status 10/01/2015 FINAL  Final    Impression/Plan:  1. MSSA bacteremia - clearing.  OK for picc line.  I do think  TEE indicated, can do a shorter duration if positive.  2.  Diarrhea - C diff test ordered but pt is on 2 laxitives.  Only 3 stools reported yesterday.  Not c/w with C diff.  I will stop laxitives, stop C diff test and Enteric precautions.

## 2015-10-01 NOTE — Progress Notes (Signed)
10/01/2015 1:29 PM  Spoke to patient regarding care concerns and assisted in service recovery.  [employer], RN-BC, [employer] [facility] Phone [phone]

## 2015-10-02 ENCOUNTER — Inpatient Hospital Stay (HOSPITAL_COMMUNITY): Payer: PPO

## 2015-10-02 DIAGNOSIS — K920 Hematemesis: Secondary | ICD-10-CM | POA: Diagnosis not present

## 2015-10-02 DIAGNOSIS — Z452 Encounter for adjustment and management of vascular access device: Secondary | ICD-10-CM | POA: Diagnosis not present

## 2015-10-02 DIAGNOSIS — T402X1D Poisoning by other opioids, accidental (unintentional), subsequent encounter: Secondary | ICD-10-CM | POA: Diagnosis not present

## 2015-10-02 DIAGNOSIS — J9601 Acute respiratory failure with hypoxia: Secondary | ICD-10-CM | POA: Diagnosis not present

## 2015-10-02 DIAGNOSIS — F419 Anxiety disorder, unspecified: Secondary | ICD-10-CM | POA: Diagnosis not present

## 2015-10-02 DIAGNOSIS — G894 Chronic pain syndrome: Secondary | ICD-10-CM

## 2015-10-02 DIAGNOSIS — M1990 Unspecified osteoarthritis, unspecified site: Secondary | ICD-10-CM | POA: Diagnosis not present

## 2015-10-02 DIAGNOSIS — G609 Hereditary and idiopathic neuropathy, unspecified: Secondary | ICD-10-CM | POA: Diagnosis not present

## 2015-10-02 DIAGNOSIS — R197 Diarrhea, unspecified: Secondary | ICD-10-CM | POA: Diagnosis not present

## 2015-10-02 DIAGNOSIS — Z79891 Long term (current) use of opiate analgesic: Secondary | ICD-10-CM | POA: Diagnosis not present

## 2015-10-02 DIAGNOSIS — Z720 Tobacco use: Secondary | ICD-10-CM | POA: Diagnosis not present

## 2015-10-02 DIAGNOSIS — Z9181 History of falling: Secondary | ICD-10-CM | POA: Diagnosis not present

## 2015-10-02 DIAGNOSIS — A4101 Sepsis due to Methicillin susceptible Staphylococcus aureus: Secondary | ICD-10-CM | POA: Diagnosis not present

## 2015-10-02 DIAGNOSIS — A4901 Methicillin susceptible Staphylococcus aureus infection, unspecified site: Secondary | ICD-10-CM | POA: Diagnosis not present

## 2015-10-02 DIAGNOSIS — M5104 Intervertebral disc disorders with myelopathy, thoracic region: Secondary | ICD-10-CM | POA: Diagnosis not present

## 2015-10-02 DIAGNOSIS — J690 Pneumonitis due to inhalation of food and vomit: Secondary | ICD-10-CM | POA: Diagnosis not present

## 2015-10-02 DIAGNOSIS — I10 Essential (primary) hypertension: Secondary | ICD-10-CM | POA: Diagnosis not present

## 2015-10-02 DIAGNOSIS — Z792 Long term (current) use of antibiotics: Secondary | ICD-10-CM | POA: Diagnosis not present

## 2015-10-02 DIAGNOSIS — N179 Acute kidney failure, unspecified: Secondary | ICD-10-CM | POA: Diagnosis not present

## 2015-10-02 DIAGNOSIS — M6281 Muscle weakness (generalized): Secondary | ICD-10-CM | POA: Diagnosis not present

## 2015-10-02 LAB — GLUCOSE, CAPILLARY
GLUCOSE-CAPILLARY: 129 mg/dL — AB (ref 65–99)
GLUCOSE-CAPILLARY: 96 mg/dL (ref 65–99)
Glucose-Capillary: 95 mg/dL (ref 65–99)

## 2015-10-02 MED ORDER — CEFAZOLIN SODIUM 1 G IV SOLR: INTRAVENOUS | Status: DC

## 2015-10-02 MED ORDER — MORPHINE SULFATE ER 15 MG PO TBCR: 15.0000 mg | EXTENDED_RELEASE_TABLET | Freq: Two times a day (BID) | ORAL | Status: DC

## 2015-10-02 MED ORDER — CEFAZOLIN SODIUM-DEXTROSE 2-3 GM-% IV SOLR
2.0000 g | Freq: Three times a day (TID) | INTRAVENOUS | Status: DC
  Administered 2015-10-02: 2 g via INTRAVENOUS
  Filled 2015-10-02 (×4): qty 50

## 2015-10-02 MED ORDER — SODIUM CHLORIDE 0.9 % IJ SOLN: 10.0000 mL | INTRAMUSCULAR | Status: DC | PRN

## 2015-10-02 MED ORDER — DOCUSATE SODIUM 100 MG PO CAPS: 200.0000 mg | ORAL_CAPSULE | Freq: Every day | ORAL | Status: DC

## 2015-10-02 NOTE — Care Management Note (Signed)
Case Management Note  Patient Details  Name: BAYLOR KRITZER MRN: [id number] Date of Birth: 01-Mar-1946  Subjective/Objective:    Staphylococcus aureus bacteremia with sepsis                 Action/Plan: [facility] and they do have updated Rx for IV abx for scheduled dc home today with HH. [facility] rep with fax dc summary to [facility]. [place] RN scheduled to come this evening for IV abx dose.    Expected Discharge Date:  10/02/2015               Expected Discharge Plan:  [place]  In-House Referral:  NA  Discharge planning Services  CM Consult  Post Acute Care Choice:  Durable Medical Equipment, Home Health Choice offered to:  Spouse  DME Arranged:  IV pump/equipment DME Agency:  [place] Arranged:  RN, PT [facility] Agency:  [place] [facility]  Status of Service:  Completed, signed off  Medicare Important Message Given:  Yes Date Medicare IM Given:    Medicare IM give by:    Date Additional Medicare IM Given:    Additional Medicare Important Message give by:     If discussed at [place] of Stay Meetings, dates discussed:    Additional Comments:  Erenest Rasher, RN 10/02/2015, 4:30 PM

## 2015-10-02 NOTE — Progress Notes (Signed)
[facility]  Patient Status: New pt for [facility] this admissoin  [facility] is providing the following services:   [facility] services for home IV ABX in partnership with [facility]. [place] infusion coordinator will support in hospital pre discharge teaching regarding IV ABX administration to support independence at home.   If patient discharges after hours, please call [phone].   Larry Sierras 10/02/2015, 10:10 AM

## 2015-10-02 NOTE — Progress Notes (Signed)
Discharge instructions given. Pt verbalized understanding and all questions were answered.  

## 2015-10-02 NOTE — Progress Notes (Signed)
[facility] for Infectious Disease   Reason for visit: Follow up on MSSA bacteremia  Interval History: repeat blood cultures the same day are negative, just 1/2 positive earlier in the day. No fever, feels better.  Eating. Discharged today.   Physical Exam: Constitutional:  Filed Vitals:   10/02/15 0517 10/02/15 0930  BP: 174/96 158/86  Pulse: 70 66  Temp: 97.9 F (36.6 C) 98.2 F (36.8 C)  Resp: 16 18   patient appears in NAD Respiratory: Normal respiratory effort; CTA B Cardiovascular: RRR GI: soft, nt  Review of Systems: Constitutional: negative for fevers and chills Musculoskeletal: negative for myalgias and arthralgias  Lab Results  Component Value Date   WBC 10.3 09/29/2015   HGB 8.5* 09/29/2015   HCT 25.6* 09/29/2015   MCV 84.8 09/29/2015   PLT 242 09/29/2015    Lab Results  Component Value Date   CREATININE 0.76 10/01/2015   BUN 6 10/01/2015   NA 140 10/01/2015   K 3.2* 10/01/2015   CL 106 10/01/2015   CO2 22 10/01/2015    Lab Results  Component Value Date   ALT 21 09/26/2015   AST 41 09/26/2015   ALKPHOS 87 09/26/2015     Microbiology: Recent Results (from the past 240 hour(s))  Blood culture (routine x 2)     Status: None   Collection Time: 09/26/15  9:17 AM  Result Value Ref Range Status   Specimen Description BLOOD LEFT ANTECUBITAL  Final   Special Requests BOTTLES DRAWN AEROBIC AND ANAEROBIC 6MLS  Final   Culture  Setup Time   Final    GRAM POSITIVE COCCI IN CLUSTERS AEROBIC BOTTLE ONLY CRITICAL RESULT CALLED TO, READ BACK BY AND VERIFIED WITH: ROBIN,RN @0441  09/27/15 MKELLY    Culture STAPHYLOCOCCUS AUREUS  Final   Report Status 09/29/2015 FINAL  Final   Organism ID, Bacteria STAPHYLOCOCCUS AUREUS  Final      Susceptibility   Staphylococcus aureus - MIC*    CIPROFLOXACIN <=0.5 SENSITIVE Sensitive     ERYTHROMYCIN <=0.25 SENSITIVE Sensitive     GENTAMICIN <=0.5 SENSITIVE Sensitive     OXACILLIN 0.5 SENSITIVE Sensitive    TETRACYCLINE <=1 SENSITIVE Sensitive     VANCOMYCIN <=0.5 SENSITIVE Sensitive     TRIMETH/SULFA <=10 SENSITIVE Sensitive     CLINDAMYCIN <=0.25 SENSITIVE Sensitive     RIFAMPIN <=0.5 SENSITIVE Sensitive     Inducible Clindamycin NEGATIVE Sensitive     * STAPHYLOCOCCUS AUREUS  Blood culture (routine x 2)     Status: None   Collection Time: 09/26/15 10:03 AM  Result Value Ref Range Status   Specimen Description BLOOD RIGHT ANTECUBITAL  Final   Special Requests BOTTLES DRAWN AEROBIC AND ANAEROBIC 6CC  Final   Culture NO GROWTH 5 DAYS  Final   Report Status 10/01/2015 FINAL  Final  Urine culture     Status: None   Collection Time: 09/26/15 12:00 PM  Result Value Ref Range Status   Specimen Description URINE, CLEAN CATCH  Final   Special Requests NONE  Final   Culture NO GROWTH 1 DAY  Final   Report Status 09/27/2015 FINAL  Final  MRSA PCR Screening     Status: None   Collection Time: 09/26/15  1:22 PM  Result Value Ref Range Status   MRSA by PCR NEGATIVE NEGATIVE Final    Comment:        The GeneXpert MRSA Assay (FDA approved for NASAL specimens only), is one component of a comprehensive MRSA  colonization surveillance program. It is not intended to diagnose MRSA infection nor to guide or monitor treatment for MRSA infections.   Culture, blood (routine x 2)     Status: None   Collection Time: 09/26/15  2:25 PM  Result Value Ref Range Status   Specimen Description BLOOD RIGHT HAND  Final   Special Requests BOTTLES DRAWN AEROBIC ONLY 2CC  Final   Culture NO GROWTH 5 DAYS  Final   Report Status 10/01/2015 FINAL  Final  Culture, blood (routine x 2)     Status: None   Collection Time: 09/26/15  2:30 PM  Result Value Ref Range Status   Specimen Description BLOOD LEFT HAND  Final   Special Requests BOTTLES DRAWN AEROBIC ONLY 3CC  Final   Culture NO GROWTH 5 DAYS  Final   Report Status 10/01/2015 FINAL  Final    Impression/Plan:  1. MSSA bacteremia - clearing.  OK for picc  line.  No TEE done, patient wants to leave today.  I recommend IV cefazolin for 4 weeks through February 7th Antibiotics per home health protocol Weekly cbc, cmp to [facility] We will arrange follow up before stopping antibiotics  2.  Diarrhea - no further concerns for C diff.

## 2015-10-02 NOTE — Discharge Summary (Signed)
Physician Discharge Summary  William Jordan [id number] DOB: 1946/09/10 DOA: 09/26/2015  PCP: [name], MD  Admit date: 09/26/2015 Discharge date: 10/02/2015  Time spent: 55 minutes  Recommendations for Outpatient Follow-up:  1. Hb in 1 -2 wks  Discharge Condition: stable    Discharge Diagnoses:  Principal Problem:   Staphylococcus aureus bacteremia with sepsis ([place]) Active Problems:   Respiratory failure ([place])   Acute respiratory failure with hypoxia (H[facility])   Aspiration pneumonia (H[facility])   Hematemesis   Chronic pain syndrome   History of present illness:  William Jordan is a 70 y.o. male who was admitted to the hospital with acute resp failure suspected to be from aspiration.  He was found down in possibly coffee ground vomitus per wife and was found to have aspiration pneumonia and subsequently was intubated on 1/11. He was extubated the following day. One set of blood cultures has grown MSSA.    Hospital Course:  Principal Problem:  Staphylococcus aureus bacteremia with sepsis - one set of blood cx + for staph- repeat cultures negative - per ID recommendations, after 1 wk total of Unasyn, have transitioned to Ancef to complete a 14 day course  PICC line placed - TTE was not adequate to rule out endocarditis  Active Problems:   Acute respiratory failure with hypoxia - Aspiration pneumonia  - now extubated and on room air - symptoms improved- has completed a course of Unasyn  Upper GI bleed ?? - family thought he had coffee ground emesis at home - he was kept on PPI in the hospital - no recurrence in the hospital- Hb has been stable as well- recommend rechecking Hb in 1-2 wk?  Acute encephalopathy - due to sepsis- resolved  Chronic pain - resumed MS Contin at much lower dose of 15 mg BID- uses 60 mg BID at home- he is agreement with decreasing his dose and also feels he was on a very high dose at home - resumed Baclofen and Gabapentin  AKI  - suspected to be  prerenal and now resolved.   Hypokalemia and Hypomagnesemia - has been replaced  Nicotine abuse - smoked E cigarettes- states he will quit and does not need prescriptions for Nicotine patches  Procedures:  ECHO  Intubation  PICC line  Consultations:  ID, PCCM  Discharge Exam: [facility]   09/29/15 2003 09/30/15 2044 10/01/15 2010  Weight: 89.359 kg (197 lb) 89.5 kg (197 lb 5 oz) 85.775 kg (189 lb 1.6 oz)   Filed Vitals:   10/02/15 0517 10/02/15 0930  BP: 174/96 158/86  Pulse: 70 66  Temp: 97.9 F (36.6 C) 98.2 F (36.8 C)  Resp: 16 18    General: AAO x 3, no distress Cardiovascular: RRR, no murmurs  Respiratory: clear to auscultation bilaterally GI: soft, non-tender, non-distended, bowel sound positive  Discharge Instructions You were cared for by a hospitalist during your hospital stay. If you have any questions about your discharge medications or the care you received while you were in the hospital after you are discharged, you can call the unit and asked to speak with the hospitalist on call if the hospitalist that took care of you is not available. Once you are discharged, your primary care physician will handle any further medical issues. Please note that NO REFILLS for any discharge medications will be authorized once you are discharged, as it is imperative that you return to your primary care physician (or establish a relationship with a primary care physician if you  do not have one) for your aftercare needs so that they can reassess your need for medications and monitor your lab values.      Discharge Instructions    Diet - low sodium heart healthy    Complete by:  As directed      Increase activity slowly    Complete by:  As directed             Medication List    TAKE these medications        acetaminophen 500 MG tablet  Commonly known as:  TYLENOL  Take 500 mg by mouth every 6 (six) hours as needed for mild pain or moderate pain.      amLODipine 10 MG tablet  Commonly known as:  NORVASC  Take 1 tablet (10 mg total) by mouth daily.     baclofen 10 MG tablet  Commonly known as:  LIORESAL  Take 1 tablet (10 mg total) by mouth at bedtime.     benazepril 20 MG tablet  Commonly known as:  LOTENSIN  Take 20 mg by mouth daily.     ceFAZolin 1 g injection  Commonly known as:  ANCEF  PLEASE DISPENSE 2GM EVERY 8 HRS FOR 7 DAYS     citalopram 20 MG tablet  Commonly known as:  CELEXA  Take 20 mg by mouth daily.     docusate sodium 100 MG capsule  Commonly known as:  COLACE  Take 2 capsules (200 mg total) by mouth at bedtime.     furosemide 20 MG tablet  Commonly known as:  LASIX  Take 20 mg by mouth daily as needed for fluid or edema.     gabapentin 600 MG tablet  Commonly known as:  NEURONTIN  Take 2 tablets (1,200 mg total) by mouth at bedtime.     gabapentin 600 MG tablet  Commonly known as:  NEURONTIN  Take 1 tablet (600 mg total) by mouth 2 (two) times daily. Morning and Lunch     loperamide 2 MG tablet  Commonly known as:  IMODIUM A-D  Take 2 mg by mouth 4 (four) times daily as needed for diarrhea or loose stools.     metoprolol 50 MG tablet  Commonly known as:  LOPRESSOR  Take 1 tablet (50 mg total) by mouth 2 (two) times daily.     morphine 15 MG 12 hr tablet  Commonly known as:  MS CONTIN  Take 1 tablet (15 mg total) by mouth every 12 (twelve) hours.     polyethylene glycol packet  Commonly known as:  MIRALAX / GLYCOLAX  Take 17 g by mouth daily.     tamsulosin 0.4 MG Caps capsule  Commonly known as:  FLOMAX  Take 0.4 mg by mouth daily.     vitamin B-12 100 MCG tablet  Commonly known as:  CYANOCOBALAMIN  Take 1 tablet (100 mcg total) by mouth daily.       No Known Allergies    The results of significant diagnostics from this hospitalization (including imaging, microbiology, ancillary and laboratory) are listed below for reference.    Significant Diagnostic Studies: Dg Chest Port 1  View  10/02/2015  CLINICAL DATA:  Confirm PICC line placement EXAM: PORTABLE CHEST 1 VIEW COMPARISON:  09/27/2015 FINDINGS: Interval removal of the endotracheal tube and nasogastric tube. Right-sided PICC line with the tip projecting over the cavoatrial junction. Spinal stimulator probe is noted. Bilateral mild interstitial thickening. No pleural effusion or pneumothorax. Low lung volumes. Stable cardiomediastinal  silhouette. Osteoarthritis of the right glenohumeral joint. Left glenohumeral arthroplasty. IMPRESSION: 1. Right-sided PICC line with the tip projecting over the cavoatrial junction. Electronically Signed   By: Kathreen Devoid   On: 10/02/2015 10:08   Dg Chest Port 1 View  09/27/2015  CLINICAL DATA:  Respiratory failure, sepsis, current smoker. EXAM: PORTABLE CHEST 1 VIEW COMPARISON:  Portable chest x-ray of July 26, 2016 FINDINGS: The lungs are mildly hypoinflated. The retrocardiac region on the left remains dense in the left hemidiaphragm obscured. The cardiac silhouette is mildly enlarged. The central pulmonary vascularity is mildly prominent. The endotracheal tube tip projects 3.2 cm above the carina. The left internal jugular venous catheter tip projects over the proximal SVC. The esophagogastric tube tip projects below the inferior margin of the image. IMPRESSION: Allowing for differences in positioning there has not been dramatic interval change since yesterday's study. There is persistent left lower lobe atelectasis or pneumonia with small left pleural effusion. Mild right basilar atelectasis is suspected as well. The support tubes are in reasonable position. Electronically Signed   By: David  Martinique M.D.   On: 09/27/2015 07:24   Dg Chest Port 1 View  09/26/2015  CLINICAL DATA:  Central line infection.  ETT placement. EXAM: PORTABLE CHEST 1 VIEW COMPARISON:  08/14/2015 FINDINGS: There is an endotracheal tube with the tip 2.4 cm above the carina. There is a nasogastric tube coursing below  the diaphragm. There is a left-sided central venous catheter with the tip at the confluence of the brachiocephalic vein and SVC. Small left pleural effusion. Left basilar airspace disease likely reflecting atelectasis versus pneumonia. There is no pneumothorax. The heart and mediastinal contours are unremarkable. Left shoulder arthroplasty. IMPRESSION: 1. Endotracheal tube with the tip 2.4 cm above the carina. 2. Small left pleural effusion with left basilar airspace disease which may reflect atelectasis versus pneumonia. Electronically Signed   By: Kathreen Devoid   On: 09/26/2015 12:44   Dg Chest Portable 1 View  09/26/2015  CLINICAL DATA:  Status post exchange of endotracheal tube. EXAM: PORTABLE CHEST 1 VIEW COMPARISON:  Single view of the chest earlier today. FINDINGS: Endotracheal tube is in place with the tip in good position at the level of the clavicular heads. NG tube is in the stomach. Airspace disease in left mid and lower lung zones is unchanged. No new abnormality. IMPRESSION: ET tube is in good position.  No other change. Electronically Signed   By: Inge Rise M.D.   On: 09/26/2015 11:53   Dg Chest Portable 1 View  09/26/2015  CLINICAL DATA:  Intubation, altered mental status, hypertension, smoker EXAM: PORTABLE CHEST 1 VIEW COMPARISON:  Portable exam 1047 hours compared to 09/26/2015 FINDINGS: Tip of endotracheal tube projects 2.5 cm above carina. Nasogastric tube coiled in stomach. Spinal stimulator leads project over mid chest. Minimal enlargement of cardiac silhouette. Atherosclerotic calcification aorta. Low lung volumes with LEFT perihilar and lower lobe infiltrate, questionably involving lingula as well. Mild RIGHT basilar atelectasis. No gross pleural effusion or pneumothorax. Bones demineralized with note of old RIGHT clavicular fracture and LEFT shoulder arthroplasty. IMPRESSION: Persistent LEFT perihilar and lower lobe infiltrates question pneumonia. Minimal RIGHT basilar  atelectasis. Low lung volumes. Electronically Signed   By: Lavonia Dana M.D.   On: 09/26/2015 11:02   Dg Chest Portable 1 View  09/26/2015  CLINICAL DATA:  Hypoxia.  Altered mental status. EXAM: PORTABLE CHEST 1 VIEW COMPARISON:  PA and lateral chest 08/14/2015 and 05/15/2015. CT chest 06/08/2015. FINDINGS: Patchy airspace disease  is seen in the lingula. The right lung is clear. Heart size is normal. No pneumothorax or pleural effusion. Spinal stimulator and left shoulder replacement are noted. The patient has remote right clavicle fracture. IMPRESSION: Patchy airspace disease in the lingula worrisome for pneumonia. Electronically Signed   By: Inge Rise M.D.   On: 09/26/2015 09:40    Microbiology: Recent Results (from the past 240 hour(s))  Blood culture (routine x 2)     Status: None   Collection Time: 09/26/15  9:17 AM  Result Value Ref Range Status   Specimen Description BLOOD LEFT ANTECUBITAL  Final   Special Requests BOTTLES DRAWN AEROBIC AND ANAEROBIC 6MLS  Final   Culture  Setup Time   Final    GRAM POSITIVE COCCI IN CLUSTERS AEROBIC BOTTLE ONLY CRITICAL RESULT CALLED TO, READ BACK BY AND VERIFIED WITH: ROBIN,RN @0441  09/27/15 MKELLY    Culture STAPHYLOCOCCUS AUREUS  Final   Report Status 09/29/2015 FINAL  Final   Organism ID, Bacteria STAPHYLOCOCCUS AUREUS  Final      Susceptibility   Staphylococcus aureus - MIC*    CIPROFLOXACIN <=0.5 SENSITIVE Sensitive     ERYTHROMYCIN <=0.25 SENSITIVE Sensitive     GENTAMICIN <=0.5 SENSITIVE Sensitive     OXACILLIN 0.5 SENSITIVE Sensitive     TETRACYCLINE <=1 SENSITIVE Sensitive     VANCOMYCIN <=0.5 SENSITIVE Sensitive     TRIMETH/SULFA <=10 SENSITIVE Sensitive     CLINDAMYCIN <=0.25 SENSITIVE Sensitive     RIFAMPIN <=0.5 SENSITIVE Sensitive     Inducible Clindamycin NEGATIVE Sensitive     * STAPHYLOCOCCUS AUREUS  Blood culture (routine x 2)     Status: None   Collection Time: 09/26/15 10:03 AM  Result Value Ref Range Status    Specimen Description BLOOD RIGHT ANTECUBITAL  Final   Special Requests BOTTLES DRAWN AEROBIC AND ANAEROBIC 6CC  Final   Culture NO GROWTH 5 DAYS  Final   Report Status 10/01/2015 FINAL  Final  Urine culture     Status: None   Collection Time: 09/26/15 12:00 PM  Result Value Ref Range Status   Specimen Description URINE, CLEAN CATCH  Final   Special Requests NONE  Final   Culture NO GROWTH 1 DAY  Final   Report Status 09/27/2015 FINAL  Final  MRSA PCR Screening     Status: None   Collection Time: 09/26/15  1:22 PM  Result Value Ref Range Status   MRSA by PCR NEGATIVE NEGATIVE Final    Comment:        The GeneXpert MRSA Assay (FDA approved for NASAL specimens only), is one component of a comprehensive MRSA colonization surveillance program. It is not intended to diagnose MRSA infection nor to guide or monitor treatment for MRSA infections.   Culture, blood (routine x 2)     Status: None   Collection Time: 09/26/15  2:25 PM  Result Value Ref Range Status   Specimen Description BLOOD RIGHT HAND  Final   Special Requests BOTTLES DRAWN AEROBIC ONLY 2CC  Final   Culture NO GROWTH 5 DAYS  Final   Report Status 10/01/2015 FINAL  Final  Culture, blood (routine x 2)     Status: None   Collection Time: 09/26/15  2:30 PM  Result Value Ref Range Status   Specimen Description BLOOD LEFT HAND  Final   Special Requests BOTTLES DRAWN AEROBIC ONLY 3CC  Final   Culture NO GROWTH 5 DAYS  Final   Report Status 10/01/2015 FINAL  Final  Labs: Basic Metabolic Panel:  Recent Labs Lab 09/26/15 1454 09/27/15 0600 09/29/15 0325 09/30/15 0830 10/01/15 0615  NA 135 137 140 140 140  K 5.8* 4.0 2.9* 3.1* 3.2*  CL 107 110 105 99* 106  CO2 20* 23 24 25 22   GLUCOSE 152* 125* 91 96 97  BUN 37* 28* <5* <5* 6  CREATININE 2.05* 1.45* 0.73 0.75 0.76  CALCIUM 7.4* 7.8* 8.4* 9.0 8.7*  MG 1.5*  --  1.2*  --  1.6*  1.7  PHOS 3.5  --   --   --   --    Liver Function Tests:  Recent Labs Lab  09/26/15 0917 09/26/15 1454  AST 22 41  ALT 11* 21  ALKPHOS 111 87  BILITOT 0.6 0.8  PROT 6.7 5.2*  ALBUMIN 3.3* 2.5*   No results for input(s): LIPASE, AMYLASE in the last 168 hours.  Recent Labs Lab 09/26/15 1003  AMMONIA 35   CBC:  Recent Labs Lab 09/26/15 0917 09/26/15 1454 09/27/15 0730 09/29/15 0325  WBC 12.5* 13.8* 12.4* 10.3  NEUTROABS 11.4*  --   --  8.1*  HGB 10.7* 9.6* 8.3* 8.5*  HCT 34.5* 30.5* 25.4* 25.6*  MCV 88.0 85.9 83.8 84.8  PLT 333 327 227 242   Cardiac Enzymes: No results for input(s): CKTOTAL, CKMB, CKMBINDEX, TROPONINI in the last 168 hours. BNP: BNP (last 3 results) No results for input(s): BNP in the last 8760 hours.  ProBNP (last 3 results) No results for input(s): PROBNP in the last 8760 hours.  CBG:  Recent Labs Lab 10/01/15 1649 10/01/15 2008 10/01/15 2356 10/02/15 0400 10/02/15 0809  GLUCAP 109* 113* 112* 129* 96       Signed:  Debbe Odea, MD [facility] 10/02/2015, 11:25 AM

## 2015-10-02 NOTE — Progress Notes (Signed)
Peripherally Inserted Central Catheter/Midline Placement  The IV Nurse has discussed with the patient and/or persons authorized to consent for the patient, the purpose of this procedure and the potential benefits and risks involved with this procedure.  The benefits include less needle sticks, lab draws from the catheter and patient may be discharged home with the catheter.  Risks include, but not limited to, infection, bleeding, blood clot (thrombus formation), and puncture of an artery; nerve damage and irregular heat beat.  Alternatives to this procedure were also discussed.  PICC/Midline Placement Documentation  PICC Single Lumen 10/02/15 PICC Right Brachial 40 cm 0 cm (Active)  Indication for Insertion or Continuance of Line Home intravenous therapies (PICC only) 10/02/2015  9:00 AM  Exposed Catheter (cm) 0 cm 10/02/2015  9:00 AM  Dressing Change Due 10/09/15 10/02/2015  9:00 AM       Jule Economy Horton 10/02/2015, 9:33 AM

## 2015-10-05 DIAGNOSIS — F14129 Cocaine abuse with intoxication, unspecified: Secondary | ICD-10-CM | POA: Diagnosis not present

## 2015-10-05 DIAGNOSIS — K521 Toxic gastroenteritis and colitis: Secondary | ICD-10-CM | POA: Diagnosis not present

## 2015-10-05 DIAGNOSIS — Z79899 Other long term (current) drug therapy: Secondary | ICD-10-CM | POA: Diagnosis not present

## 2015-10-05 DIAGNOSIS — R52 Pain, unspecified: Secondary | ICD-10-CM | POA: Diagnosis not present

## 2015-10-05 DIAGNOSIS — J189 Pneumonia, unspecified organism: Secondary | ICD-10-CM | POA: Diagnosis not present

## 2015-10-05 DIAGNOSIS — R197 Diarrhea, unspecified: Secondary | ICD-10-CM | POA: Diagnosis not present

## 2015-10-05 DIAGNOSIS — F1123 Opioid dependence with withdrawal: Secondary | ICD-10-CM | POA: Diagnosis not present

## 2015-10-05 DIAGNOSIS — J690 Pneumonitis due to inhalation of food and vomit: Secondary | ICD-10-CM | POA: Diagnosis not present

## 2015-10-05 DIAGNOSIS — B9561 Methicillin susceptible Staphylococcus aureus infection as the cause of diseases classified elsewhere: Secondary | ICD-10-CM | POA: Diagnosis not present

## 2015-10-05 DIAGNOSIS — R7881 Bacteremia: Secondary | ICD-10-CM | POA: Insufficient documentation

## 2015-10-05 DIAGNOSIS — R112 Nausea with vomiting, unspecified: Secondary | ICD-10-CM | POA: Diagnosis not present

## 2015-10-05 DIAGNOSIS — R109 Unspecified abdominal pain: Secondary | ICD-10-CM | POA: Diagnosis not present

## 2015-10-05 DIAGNOSIS — N400 Enlarged prostate without lower urinary tract symptoms: Secondary | ICD-10-CM | POA: Diagnosis not present

## 2015-10-05 DIAGNOSIS — T402X5A Adverse effect of other opioids, initial encounter: Secondary | ICD-10-CM | POA: Diagnosis not present

## 2015-10-05 DIAGNOSIS — Z87891 Personal history of nicotine dependence: Secondary | ICD-10-CM | POA: Diagnosis not present

## 2015-10-05 DIAGNOSIS — D72829 Elevated white blood cell count, unspecified: Secondary | ICD-10-CM | POA: Diagnosis not present

## 2015-10-05 DIAGNOSIS — J9600 Acute respiratory failure, unspecified whether with hypoxia or hypercapnia: Secondary | ICD-10-CM | POA: Diagnosis not present

## 2015-10-05 DIAGNOSIS — A419 Sepsis, unspecified organism: Secondary | ICD-10-CM | POA: Diagnosis not present

## 2015-10-05 DIAGNOSIS — F1910 Other psychoactive substance abuse, uncomplicated: Secondary | ICD-10-CM | POA: Insufficient documentation

## 2015-10-05 DIAGNOSIS — J984 Other disorders of lung: Secondary | ICD-10-CM | POA: Diagnosis not present

## 2015-10-05 DIAGNOSIS — I10 Essential (primary) hypertension: Secondary | ICD-10-CM | POA: Diagnosis not present

## 2015-10-05 DIAGNOSIS — Z959 Presence of cardiac and vascular implant and graft, unspecified: Secondary | ICD-10-CM | POA: Diagnosis not present

## 2015-10-05 DIAGNOSIS — G8929 Other chronic pain: Secondary | ICD-10-CM | POA: Diagnosis not present

## 2015-10-05 DIAGNOSIS — G9340 Encephalopathy, unspecified: Secondary | ICD-10-CM | POA: Diagnosis not present

## 2015-10-05 DIAGNOSIS — I159 Secondary hypertension, unspecified: Secondary | ICD-10-CM | POA: Diagnosis not present

## 2015-10-05 DIAGNOSIS — G92 Toxic encephalopathy: Secondary | ICD-10-CM | POA: Diagnosis not present

## 2015-10-05 DIAGNOSIS — R4182 Altered mental status, unspecified: Secondary | ICD-10-CM | POA: Diagnosis not present

## 2015-10-05 DIAGNOSIS — J90 Pleural effusion, not elsewhere classified: Secondary | ICD-10-CM | POA: Diagnosis not present

## 2015-10-05 DIAGNOSIS — G629 Polyneuropathy, unspecified: Secondary | ICD-10-CM | POA: Diagnosis not present

## 2015-10-05 DIAGNOSIS — F329 Major depressive disorder, single episode, unspecified: Secondary | ICD-10-CM | POA: Diagnosis not present

## 2015-10-06 DIAGNOSIS — R197 Diarrhea, unspecified: Secondary | ICD-10-CM | POA: Diagnosis not present

## 2015-10-06 DIAGNOSIS — Z96641 Presence of right artificial hip joint: Secondary | ICD-10-CM | POA: Diagnosis not present

## 2015-10-06 DIAGNOSIS — F1910 Other psychoactive substance abuse, uncomplicated: Secondary | ICD-10-CM | POA: Diagnosis not present

## 2015-10-06 DIAGNOSIS — B9561 Methicillin susceptible Staphylococcus aureus infection as the cause of diseases classified elsewhere: Secondary | ICD-10-CM | POA: Diagnosis not present

## 2015-10-06 DIAGNOSIS — R52 Pain, unspecified: Secondary | ICD-10-CM | POA: Diagnosis not present

## 2015-10-06 DIAGNOSIS — I159 Secondary hypertension, unspecified: Secondary | ICD-10-CM | POA: Diagnosis not present

## 2015-10-06 DIAGNOSIS — M129 Arthropathy, unspecified: Secondary | ICD-10-CM | POA: Diagnosis not present

## 2015-10-06 DIAGNOSIS — R112 Nausea with vomiting, unspecified: Secondary | ICD-10-CM | POA: Diagnosis not present

## 2015-10-06 DIAGNOSIS — R109 Unspecified abdominal pain: Secondary | ICD-10-CM | POA: Diagnosis not present

## 2015-10-06 DIAGNOSIS — G9340 Encephalopathy, unspecified: Secondary | ICD-10-CM | POA: Diagnosis not present

## 2015-10-06 DIAGNOSIS — J189 Pneumonia, unspecified organism: Secondary | ICD-10-CM | POA: Diagnosis not present

## 2015-10-07 DIAGNOSIS — Z452 Encounter for adjustment and management of vascular access device: Secondary | ICD-10-CM | POA: Diagnosis not present

## 2015-10-07 DIAGNOSIS — F1910 Other psychoactive substance abuse, uncomplicated: Secondary | ICD-10-CM | POA: Diagnosis not present

## 2015-10-07 DIAGNOSIS — R197 Diarrhea, unspecified: Secondary | ICD-10-CM | POA: Diagnosis not present

## 2015-10-07 DIAGNOSIS — R918 Other nonspecific abnormal finding of lung field: Secondary | ICD-10-CM | POA: Diagnosis not present

## 2015-10-07 DIAGNOSIS — R112 Nausea with vomiting, unspecified: Secondary | ICD-10-CM | POA: Diagnosis not present

## 2015-10-07 DIAGNOSIS — I159 Secondary hypertension, unspecified: Secondary | ICD-10-CM | POA: Diagnosis not present

## 2015-10-07 DIAGNOSIS — J189 Pneumonia, unspecified organism: Secondary | ICD-10-CM | POA: Diagnosis not present

## 2015-10-07 DIAGNOSIS — B9561 Methicillin susceptible Staphylococcus aureus infection as the cause of diseases classified elsewhere: Secondary | ICD-10-CM | POA: Diagnosis not present

## 2015-10-07 DIAGNOSIS — R52 Pain, unspecified: Secondary | ICD-10-CM | POA: Diagnosis not present

## 2015-10-08 DIAGNOSIS — F1910 Other psychoactive substance abuse, uncomplicated: Secondary | ICD-10-CM | POA: Diagnosis not present

## 2015-10-08 DIAGNOSIS — R52 Pain, unspecified: Secondary | ICD-10-CM | POA: Diagnosis not present

## 2015-10-08 DIAGNOSIS — I159 Secondary hypertension, unspecified: Secondary | ICD-10-CM | POA: Diagnosis not present

## 2015-10-08 DIAGNOSIS — J189 Pneumonia, unspecified organism: Secondary | ICD-10-CM | POA: Diagnosis not present

## 2015-10-08 DIAGNOSIS — R112 Nausea with vomiting, unspecified: Secondary | ICD-10-CM | POA: Diagnosis not present

## 2015-10-08 DIAGNOSIS — R197 Diarrhea, unspecified: Secondary | ICD-10-CM | POA: Diagnosis not present

## 2015-10-08 DIAGNOSIS — B9561 Methicillin susceptible Staphylococcus aureus infection as the cause of diseases classified elsewhere: Secondary | ICD-10-CM | POA: Diagnosis not present

## 2015-10-09 DIAGNOSIS — J189 Pneumonia, unspecified organism: Secondary | ICD-10-CM | POA: Diagnosis not present

## 2015-10-09 DIAGNOSIS — R112 Nausea with vomiting, unspecified: Secondary | ICD-10-CM | POA: Diagnosis not present

## 2015-10-09 DIAGNOSIS — R197 Diarrhea, unspecified: Secondary | ICD-10-CM | POA: Diagnosis not present

## 2015-10-09 DIAGNOSIS — B9561 Methicillin susceptible Staphylococcus aureus infection as the cause of diseases classified elsewhere: Secondary | ICD-10-CM | POA: Diagnosis not present

## 2015-10-09 DIAGNOSIS — F1910 Other psychoactive substance abuse, uncomplicated: Secondary | ICD-10-CM | POA: Diagnosis not present

## 2015-10-09 DIAGNOSIS — R52 Pain, unspecified: Secondary | ICD-10-CM | POA: Diagnosis not present

## 2015-10-09 DIAGNOSIS — I159 Secondary hypertension, unspecified: Secondary | ICD-10-CM | POA: Diagnosis not present

## 2015-10-10 ENCOUNTER — Telehealth: Payer: Self-pay | Admitting: *Deleted

## 2015-10-10 DIAGNOSIS — R5381 Other malaise: Secondary | ICD-10-CM | POA: Diagnosis not present

## 2015-10-10 DIAGNOSIS — R2681 Unsteadiness on feet: Secondary | ICD-10-CM | POA: Diagnosis not present

## 2015-10-10 DIAGNOSIS — F99 Mental disorder, not otherwise specified: Secondary | ICD-10-CM | POA: Diagnosis not present

## 2015-10-10 DIAGNOSIS — R279 Unspecified lack of coordination: Secondary | ICD-10-CM | POA: Diagnosis not present

## 2015-10-10 DIAGNOSIS — F1990 Other psychoactive substance use, unspecified, uncomplicated: Secondary | ICD-10-CM | POA: Diagnosis not present

## 2015-10-10 DIAGNOSIS — J690 Pneumonitis due to inhalation of food and vomit: Secondary | ICD-10-CM | POA: Diagnosis not present

## 2015-10-10 DIAGNOSIS — R7881 Bacteremia: Secondary | ICD-10-CM | POA: Diagnosis not present

## 2015-10-10 DIAGNOSIS — R262 Difficulty in walking, not elsewhere classified: Secondary | ICD-10-CM | POA: Diagnosis not present

## 2015-10-10 DIAGNOSIS — B9561 Methicillin susceptible Staphylococcus aureus infection as the cause of diseases classified elsewhere: Secondary | ICD-10-CM | POA: Diagnosis not present

## 2015-10-10 DIAGNOSIS — M6281 Muscle weakness (generalized): Secondary | ICD-10-CM | POA: Diagnosis not present

## 2015-10-10 DIAGNOSIS — I10 Essential (primary) hypertension: Secondary | ICD-10-CM | POA: Diagnosis not present

## 2015-10-10 DIAGNOSIS — G8929 Other chronic pain: Secondary | ICD-10-CM | POA: Diagnosis not present

## 2015-10-10 DIAGNOSIS — R293 Abnormal posture: Secondary | ICD-10-CM | POA: Diagnosis not present

## 2015-10-10 NOTE — Telephone Encounter (Signed)
[place], thanks.  Hopefully picc is out.

## 2015-10-10 NOTE — Telephone Encounter (Signed)
Received message in Triage from [facility] at [facility], called back and spoke with Amy.  Melissa called the patient today for her weekly check in.  The patient stated "I'm not taking those any more. Who are you? (Melissa answered [place]) Now I know who to sue."  Per Amy, the patient was admitted over the weekend to [facility] for cocaine overdose.  The PICC was supposed to be removed during hospitalization, Amy will confirm this with [place] and [facility] will not resume care.  Patient is currently scheduled for follow up 2/2, per [facility] it is unlikely he will come. Landis Gandy, RN

## 2015-10-10 NOTE — Telephone Encounter (Signed)
Melissa at [facility] followed up.  Patient was actually hospitalized in [place]. They determined he needed more IV antibiotics, pt was discharged directly to a SNF for completion.  Patient is now being managed by [facility] in [place].  They have their own provider to prescribe and pharmacy to manage the antibiotics.  Patient will be following up with Dr. Nona Dell in [place].  He is discharged from [facility], will not be following up at [facility].  Appointment cancelled.

## 2015-10-16 DIAGNOSIS — R5381 Other malaise: Secondary | ICD-10-CM | POA: Diagnosis not present

## 2015-10-16 DIAGNOSIS — J690 Pneumonitis due to inhalation of food and vomit: Secondary | ICD-10-CM | POA: Diagnosis not present

## 2015-10-16 DIAGNOSIS — R7881 Bacteremia: Secondary | ICD-10-CM | POA: Diagnosis not present

## 2015-10-16 DIAGNOSIS — G894 Chronic pain syndrome: Secondary | ICD-10-CM | POA: Diagnosis not present

## 2015-10-16 DIAGNOSIS — Z79899 Other long term (current) drug therapy: Secondary | ICD-10-CM | POA: Diagnosis not present

## 2015-10-17 DIAGNOSIS — R2681 Unsteadiness on feet: Secondary | ICD-10-CM | POA: Diagnosis not present

## 2015-10-17 DIAGNOSIS — F1990 Other psychoactive substance use, unspecified, uncomplicated: Secondary | ICD-10-CM | POA: Diagnosis not present

## 2015-10-17 DIAGNOSIS — R262 Difficulty in walking, not elsewhere classified: Secondary | ICD-10-CM | POA: Diagnosis not present

## 2015-10-17 DIAGNOSIS — B9561 Methicillin susceptible Staphylococcus aureus infection as the cause of diseases classified elsewhere: Secondary | ICD-10-CM | POA: Diagnosis not present

## 2015-10-17 DIAGNOSIS — R7881 Bacteremia: Secondary | ICD-10-CM | POA: Diagnosis not present

## 2015-10-17 DIAGNOSIS — J690 Pneumonitis due to inhalation of food and vomit: Secondary | ICD-10-CM | POA: Diagnosis not present

## 2015-10-17 DIAGNOSIS — I10 Essential (primary) hypertension: Secondary | ICD-10-CM | POA: Diagnosis not present

## 2015-10-17 DIAGNOSIS — R293 Abnormal posture: Secondary | ICD-10-CM | POA: Diagnosis not present

## 2015-10-17 DIAGNOSIS — G8929 Other chronic pain: Secondary | ICD-10-CM | POA: Diagnosis not present

## 2015-10-18 ENCOUNTER — Inpatient Hospital Stay: Payer: PPO | Admitting: Infectious Diseases

## 2015-10-23 DIAGNOSIS — Z79899 Other long term (current) drug therapy: Secondary | ICD-10-CM | POA: Diagnosis not present

## 2015-11-05 DIAGNOSIS — Z79899 Other long term (current) drug therapy: Secondary | ICD-10-CM | POA: Diagnosis not present

## 2015-11-05 DIAGNOSIS — G894 Chronic pain syndrome: Secondary | ICD-10-CM | POA: Diagnosis not present

## 2015-11-05 DIAGNOSIS — Z5181 Encounter for therapeutic drug level monitoring: Secondary | ICD-10-CM | POA: Diagnosis not present

## 2015-11-07 DIAGNOSIS — G603 Idiopathic progressive neuropathy: Secondary | ICD-10-CM | POA: Diagnosis not present

## 2015-11-07 DIAGNOSIS — M1991 Primary osteoarthritis, unspecified site: Secondary | ICD-10-CM | POA: Diagnosis not present

## 2015-11-07 DIAGNOSIS — Z79891 Long term (current) use of opiate analgesic: Secondary | ICD-10-CM | POA: Diagnosis not present

## 2015-11-07 DIAGNOSIS — G894 Chronic pain syndrome: Secondary | ICD-10-CM | POA: Diagnosis not present

## 2015-12-05 DIAGNOSIS — G603 Idiopathic progressive neuropathy: Secondary | ICD-10-CM | POA: Diagnosis not present

## 2015-12-05 DIAGNOSIS — M1991 Primary osteoarthritis, unspecified site: Secondary | ICD-10-CM | POA: Diagnosis not present

## 2015-12-05 DIAGNOSIS — Z79891 Long term (current) use of opiate analgesic: Secondary | ICD-10-CM | POA: Diagnosis not present

## 2015-12-05 DIAGNOSIS — G894 Chronic pain syndrome: Secondary | ICD-10-CM | POA: Diagnosis not present

## 2015-12-17 DIAGNOSIS — G894 Chronic pain syndrome: Secondary | ICD-10-CM | POA: Diagnosis not present

## 2015-12-17 DIAGNOSIS — Z79891 Long term (current) use of opiate analgesic: Secondary | ICD-10-CM | POA: Diagnosis not present

## 2015-12-17 DIAGNOSIS — G603 Idiopathic progressive neuropathy: Secondary | ICD-10-CM | POA: Diagnosis not present

## 2015-12-17 DIAGNOSIS — M1991 Primary osteoarthritis, unspecified site: Secondary | ICD-10-CM | POA: Diagnosis not present

## 2016-01-02 DIAGNOSIS — Z79891 Long term (current) use of opiate analgesic: Secondary | ICD-10-CM | POA: Diagnosis not present

## 2016-01-02 DIAGNOSIS — M1991 Primary osteoarthritis, unspecified site: Secondary | ICD-10-CM | POA: Diagnosis not present

## 2016-01-02 DIAGNOSIS — G603 Idiopathic progressive neuropathy: Secondary | ICD-10-CM | POA: Diagnosis not present

## 2016-01-02 DIAGNOSIS — G894 Chronic pain syndrome: Secondary | ICD-10-CM | POA: Diagnosis not present

## 2016-01-07 DIAGNOSIS — I10 Essential (primary) hypertension: Secondary | ICD-10-CM | POA: Diagnosis not present

## 2016-01-07 DIAGNOSIS — M4806 Spinal stenosis, lumbar region: Secondary | ICD-10-CM | POA: Diagnosis not present

## 2016-01-30 DIAGNOSIS — Z87448 Personal history of other diseases of urinary system: Secondary | ICD-10-CM | POA: Diagnosis not present

## 2016-01-30 DIAGNOSIS — G603 Idiopathic progressive neuropathy: Secondary | ICD-10-CM | POA: Diagnosis not present

## 2016-01-30 DIAGNOSIS — N50819 Testicular pain, unspecified: Secondary | ICD-10-CM | POA: Diagnosis not present

## 2016-01-30 DIAGNOSIS — G894 Chronic pain syndrome: Secondary | ICD-10-CM | POA: Diagnosis not present

## 2016-01-30 DIAGNOSIS — N401 Enlarged prostate with lower urinary tract symptoms: Secondary | ICD-10-CM | POA: Diagnosis not present

## 2016-01-30 DIAGNOSIS — Z79891 Long term (current) use of opiate analgesic: Secondary | ICD-10-CM | POA: Diagnosis not present

## 2016-01-30 DIAGNOSIS — M1991 Primary osteoarthritis, unspecified site: Secondary | ICD-10-CM | POA: Diagnosis not present

## 2016-01-30 DIAGNOSIS — Z125 Encounter for screening for malignant neoplasm of prostate: Secondary | ICD-10-CM | POA: Diagnosis not present

## 2016-01-31 DIAGNOSIS — N50811 Right testicular pain: Secondary | ICD-10-CM | POA: Diagnosis not present

## 2016-01-31 DIAGNOSIS — N50819 Testicular pain, unspecified: Secondary | ICD-10-CM | POA: Diagnosis not present

## 2016-01-31 DIAGNOSIS — N401 Enlarged prostate with lower urinary tract symptoms: Secondary | ICD-10-CM | POA: Diagnosis not present

## 2016-01-31 DIAGNOSIS — R938 Abnormal findings on diagnostic imaging of other specified body structures: Secondary | ICD-10-CM | POA: Diagnosis not present

## 2016-02-08 DIAGNOSIS — I10 Essential (primary) hypertension: Secondary | ICD-10-CM | POA: Diagnosis not present

## 2016-02-22 DIAGNOSIS — M4306 Spondylolysis, lumbar region: Secondary | ICD-10-CM | POA: Diagnosis not present

## 2016-02-22 DIAGNOSIS — M4806 Spinal stenosis, lumbar region: Secondary | ICD-10-CM | POA: Diagnosis not present

## 2016-02-22 DIAGNOSIS — M5136 Other intervertebral disc degeneration, lumbar region: Secondary | ICD-10-CM | POA: Diagnosis not present

## 2016-02-26 DIAGNOSIS — N400 Enlarged prostate without lower urinary tract symptoms: Secondary | ICD-10-CM | POA: Diagnosis not present

## 2016-02-26 DIAGNOSIS — G609 Hereditary and idiopathic neuropathy, unspecified: Secondary | ICD-10-CM | POA: Diagnosis not present

## 2016-02-26 DIAGNOSIS — E559 Vitamin D deficiency, unspecified: Secondary | ICD-10-CM | POA: Diagnosis not present

## 2016-02-26 DIAGNOSIS — I10 Essential (primary) hypertension: Secondary | ICD-10-CM | POA: Diagnosis not present

## 2016-02-26 DIAGNOSIS — Z1389 Encounter for screening for other disorder: Secondary | ICD-10-CM | POA: Diagnosis not present

## 2016-02-26 DIAGNOSIS — E7800 Pure hypercholesterolemia, unspecified: Secondary | ICD-10-CM | POA: Diagnosis not present

## 2016-02-26 DIAGNOSIS — G894 Chronic pain syndrome: Secondary | ICD-10-CM | POA: Diagnosis not present

## 2016-02-26 DIAGNOSIS — M150 Primary generalized (osteo)arthritis: Secondary | ICD-10-CM | POA: Diagnosis not present

## 2016-02-26 DIAGNOSIS — Z0000 Encounter for general adult medical examination without abnormal findings: Secondary | ICD-10-CM | POA: Diagnosis not present

## 2016-02-26 DIAGNOSIS — F411 Generalized anxiety disorder: Secondary | ICD-10-CM | POA: Diagnosis not present

## 2016-02-28 DIAGNOSIS — M1991 Primary osteoarthritis, unspecified site: Secondary | ICD-10-CM | POA: Diagnosis not present

## 2016-02-28 DIAGNOSIS — G603 Idiopathic progressive neuropathy: Secondary | ICD-10-CM | POA: Diagnosis not present

## 2016-02-28 DIAGNOSIS — Z79891 Long term (current) use of opiate analgesic: Secondary | ICD-10-CM | POA: Diagnosis not present

## 2016-02-28 DIAGNOSIS — G894 Chronic pain syndrome: Secondary | ICD-10-CM | POA: Diagnosis not present

## 2016-03-04 ENCOUNTER — Other Ambulatory Visit: Payer: Self-pay | Admitting: Orthopedic Surgery

## 2016-03-04 DIAGNOSIS — M5136 Other intervertebral disc degeneration, lumbar region: Secondary | ICD-10-CM

## 2016-03-10 DIAGNOSIS — Z87310 Personal history of (healed) osteoporosis fracture: Secondary | ICD-10-CM | POA: Diagnosis not present

## 2016-03-10 DIAGNOSIS — M150 Primary generalized (osteo)arthritis: Secondary | ICD-10-CM | POA: Diagnosis not present

## 2016-03-10 DIAGNOSIS — M8580 Other specified disorders of bone density and structure, unspecified site: Secondary | ICD-10-CM | POA: Diagnosis not present

## 2016-03-10 DIAGNOSIS — M8589 Other specified disorders of bone density and structure, multiple sites: Secondary | ICD-10-CM | POA: Diagnosis not present

## 2016-03-11 ENCOUNTER — Other Ambulatory Visit: Payer: PPO

## 2016-03-11 ENCOUNTER — Inpatient Hospital Stay
Admission: RE | Admit: 2016-03-11 | Discharge: 2016-03-11 | Disposition: A | Discharge status: Home / Self Care | Payer: PPO | Source: Ambulatory Visit | Attending: Orthopedic Surgery | Admitting: Orthopedic Surgery

## 2016-03-11 NOTE — Discharge Instructions (Signed)
Myelogram Discharge Instructions  1. Go home and rest quietly for the next 24 hours.  It is important to lie flat for the next 24 hours.  Get up only to go to the restroom.  You may lie in the bed or on a couch on your back, your stomach, your left side or your right side.  You may have one pillow under your head.  You may have pillows between your knees while you are on your side or under your knees while you are on your back.  2. DO NOT drive today.  Recline the seat as far back as it will go, while still wearing your seat belt, on the way home.  3. You may get up to go to the bathroom as needed.  You may sit up for 10 minutes to eat.  You may resume your normal diet and medications unless otherwise indicated.  Drink lots of extra fluids today and tomorrow.  4. The incidence of headache, nausea, or vomiting is about 5% (one in 20 patients).  If you develop a headache, lie flat and drink plenty of fluids until the headache goes away.  Caffeinated beverages may be helpful.  If you develop severe nausea and vomiting or a headache that does not go away with flat bed rest, call [phone].  5. You may resume normal activities after your 24 hours of bed rest is over; however, do not exert yourself strongly or do any heavy lifting tomorrow. If when you get up you have a headache when standing, go back to bed and force fluids for another 24 hours.  6. Call your physician for a follow-up appointment.  The results of your myelogram will be sent directly to your physician by the following day.  7. If you have any questions or if complications develop after you arrive home, please call [phone].  Discharge instructions have been explained to the patient.  The patient, or the person responsible for the patient, fully understands these instructions.       May resume Celexa on March 12, 2016, after 11:00 am.

## 2016-03-19 ENCOUNTER — Ambulatory Visit
Admission: RE | Admit: 2016-03-19 | Discharge: 2016-03-19 | Disposition: A | Discharge status: Home / Self Care | Payer: PPO | Source: Ambulatory Visit | Attending: Orthopedic Surgery | Admitting: Orthopedic Surgery

## 2016-03-19 DIAGNOSIS — M5136 Other intervertebral disc degeneration, lumbar region: Secondary | ICD-10-CM

## 2016-03-19 DIAGNOSIS — M4806 Spinal stenosis, lumbar region: Secondary | ICD-10-CM | POA: Diagnosis not present

## 2016-03-19 MED ORDER — DIAZEPAM 5 MG PO TABS
5.0000 mg | ORAL_TABLET | Freq: Once | ORAL | Status: AC
  Administered 2016-03-19: 5 mg via ORAL

## 2016-03-19 MED ORDER — IOPAMIDOL (ISOVUE-M 200) INJECTION 41%
15.0000 mL | Freq: Once | INTRAMUSCULAR | Status: AC
  Administered 2016-03-19: 15 mL via INTRATHECAL

## 2016-03-19 NOTE — Progress Notes (Signed)
Patient states he has been off Celexa for the past two days.  Brita Romp, RN

## 2016-03-19 NOTE — Discharge Instructions (Addendum)
[facility] Discharge Instructions  1. Go home and rest quietly for the next 24 hours.  It is important to lie flat for the next 24 hours.  Get up only to go to the restroom.  You may lie in the bed or on a couch on your back, your stomach, your left side or your right side.  You may have one pillow under your head.  You may have pillows between your knees while you are on your side or under your knees while you are on your back.  2. DO NOT drive today.  Recline the seat as far back as it will go, while still wearing your seat belt, on the way home.  3. You may get up to go to the bathroom as needed.  You may sit up for 10 minutes to eat.  You may resume your normal diet and medications unless otherwise indicated.  Drink plenty of extra fluids today and tomorrow.  4. The incidence of a spinal headache with nausea and/or vomiting is about 5% (one in 20 patients).  If you develop a headache, lie flat and drink plenty of fluids until the headache goes away.  Caffeinated beverages may be helpful.  If you develop severe nausea and vomiting or a headache that does not go away with flat bed rest, call [phone].  5. You may resume normal activities after your 24 hours of bed rest is over; however, do not exert yourself strongly or do any heavy lifting tomorrow.  6. Call your physician for a follow-up appointment.   You may resume Celexa on Thursday, March 20, 2016.

## 2016-03-24 DIAGNOSIS — Z79891 Long term (current) use of opiate analgesic: Secondary | ICD-10-CM | POA: Diagnosis not present

## 2016-03-24 DIAGNOSIS — G894 Chronic pain syndrome: Secondary | ICD-10-CM | POA: Diagnosis not present

## 2016-03-24 DIAGNOSIS — G603 Idiopathic progressive neuropathy: Secondary | ICD-10-CM | POA: Diagnosis not present

## 2016-03-24 DIAGNOSIS — M1991 Primary osteoarthritis, unspecified site: Secondary | ICD-10-CM | POA: Diagnosis not present

## 2016-03-25 DIAGNOSIS — H2513 Age-related nuclear cataract, bilateral: Secondary | ICD-10-CM | POA: Diagnosis not present

## 2016-04-01 DIAGNOSIS — Z4789 Encounter for other orthopedic aftercare: Secondary | ICD-10-CM | POA: Diagnosis not present

## 2016-04-01 DIAGNOSIS — M4806 Spinal stenosis, lumbar region: Secondary | ICD-10-CM | POA: Diagnosis not present

## 2016-04-01 DIAGNOSIS — M4306 Spondylolysis, lumbar region: Secondary | ICD-10-CM | POA: Diagnosis not present

## 2016-04-01 DIAGNOSIS — M5136 Other intervertebral disc degeneration, lumbar region: Secondary | ICD-10-CM | POA: Diagnosis not present

## 2016-04-21 DIAGNOSIS — G894 Chronic pain syndrome: Secondary | ICD-10-CM | POA: Diagnosis not present

## 2016-04-21 DIAGNOSIS — M1991 Primary osteoarthritis, unspecified site: Secondary | ICD-10-CM | POA: Diagnosis not present

## 2016-04-21 DIAGNOSIS — G603 Idiopathic progressive neuropathy: Secondary | ICD-10-CM | POA: Diagnosis not present

## 2016-04-21 DIAGNOSIS — Z79891 Long term (current) use of opiate analgesic: Secondary | ICD-10-CM | POA: Diagnosis not present

## 2016-04-24 DIAGNOSIS — F329 Major depressive disorder, single episode, unspecified: Secondary | ICD-10-CM | POA: Diagnosis not present

## 2016-04-24 DIAGNOSIS — W19XXXA Unspecified fall, initial encounter: Secondary | ICD-10-CM | POA: Diagnosis not present

## 2016-04-24 DIAGNOSIS — E871 Hypo-osmolality and hyponatremia: Secondary | ICD-10-CM | POA: Diagnosis not present

## 2016-04-24 DIAGNOSIS — F1390 Sedative, hypnotic, or anxiolytic use, unspecified, uncomplicated: Secondary | ICD-10-CM | POA: Diagnosis not present

## 2016-04-24 DIAGNOSIS — R339 Retention of urine, unspecified: Secondary | ICD-10-CM | POA: Diagnosis not present

## 2016-04-24 DIAGNOSIS — N401 Enlarged prostate with lower urinary tract symptoms: Secondary | ICD-10-CM | POA: Diagnosis not present

## 2016-04-24 DIAGNOSIS — M549 Dorsalgia, unspecified: Secondary | ICD-10-CM | POA: Diagnosis not present

## 2016-04-24 DIAGNOSIS — F1910 Other psychoactive substance abuse, uncomplicated: Secondary | ICD-10-CM | POA: Diagnosis not present

## 2016-04-24 DIAGNOSIS — G9340 Encephalopathy, unspecified: Secondary | ICD-10-CM | POA: Diagnosis not present

## 2016-04-24 DIAGNOSIS — R42 Dizziness and giddiness: Secondary | ICD-10-CM | POA: Diagnosis not present

## 2016-04-24 DIAGNOSIS — G629 Polyneuropathy, unspecified: Secondary | ICD-10-CM | POA: Diagnosis not present

## 2016-04-24 DIAGNOSIS — N179 Acute kidney failure, unspecified: Secondary | ICD-10-CM | POA: Diagnosis not present

## 2016-04-24 DIAGNOSIS — R140 Abdominal distension (gaseous): Secondary | ICD-10-CM | POA: Diagnosis not present

## 2016-04-24 DIAGNOSIS — S80211A Abrasion, right knee, initial encounter: Secondary | ICD-10-CM | POA: Diagnosis not present

## 2016-04-24 DIAGNOSIS — F17210 Nicotine dependence, cigarettes, uncomplicated: Secondary | ICD-10-CM | POA: Diagnosis not present

## 2016-04-24 DIAGNOSIS — I959 Hypotension, unspecified: Secondary | ICD-10-CM | POA: Diagnosis not present

## 2016-04-24 DIAGNOSIS — G8929 Other chronic pain: Secondary | ICD-10-CM | POA: Diagnosis not present

## 2016-04-24 DIAGNOSIS — I10 Essential (primary) hypertension: Secondary | ICD-10-CM | POA: Diagnosis not present

## 2016-04-24 DIAGNOSIS — S0093XA Contusion of unspecified part of head, initial encounter: Secondary | ICD-10-CM | POA: Diagnosis not present

## 2016-04-24 DIAGNOSIS — F1490 Cocaine use, unspecified, uncomplicated: Secondary | ICD-10-CM | POA: Diagnosis not present

## 2016-04-25 DIAGNOSIS — F1910 Other psychoactive substance abuse, uncomplicated: Secondary | ICD-10-CM | POA: Diagnosis not present

## 2016-04-25 DIAGNOSIS — I959 Hypotension, unspecified: Secondary | ICD-10-CM | POA: Diagnosis not present

## 2016-04-25 DIAGNOSIS — R339 Retention of urine, unspecified: Secondary | ICD-10-CM | POA: Insufficient documentation

## 2016-04-25 DIAGNOSIS — G9340 Encephalopathy, unspecified: Secondary | ICD-10-CM | POA: Diagnosis not present

## 2016-04-25 DIAGNOSIS — N179 Acute kidney failure, unspecified: Secondary | ICD-10-CM | POA: Diagnosis not present

## 2016-04-25 DIAGNOSIS — G8929 Other chronic pain: Secondary | ICD-10-CM | POA: Diagnosis not present

## 2016-05-01 DIAGNOSIS — G609 Hereditary and idiopathic neuropathy, unspecified: Secondary | ICD-10-CM | POA: Diagnosis not present

## 2016-05-01 DIAGNOSIS — Z6824 Body mass index (BMI) 24.0-24.9, adult: Secondary | ICD-10-CM | POA: Diagnosis not present

## 2016-05-01 DIAGNOSIS — Z1389 Encounter for screening for other disorder: Secondary | ICD-10-CM | POA: Diagnosis not present

## 2016-05-01 DIAGNOSIS — I10 Essential (primary) hypertension: Secondary | ICD-10-CM | POA: Diagnosis not present

## 2016-05-01 DIAGNOSIS — R413 Other amnesia: Secondary | ICD-10-CM | POA: Diagnosis not present

## 2016-05-05 DIAGNOSIS — N401 Enlarged prostate with lower urinary tract symptoms: Secondary | ICD-10-CM | POA: Diagnosis not present

## 2016-05-05 DIAGNOSIS — N529 Male erectile dysfunction, unspecified: Secondary | ICD-10-CM | POA: Diagnosis not present

## 2016-05-20 DIAGNOSIS — M1991 Primary osteoarthritis, unspecified site: Secondary | ICD-10-CM | POA: Diagnosis not present

## 2016-05-20 DIAGNOSIS — G894 Chronic pain syndrome: Secondary | ICD-10-CM | POA: Diagnosis not present

## 2016-05-20 DIAGNOSIS — G603 Idiopathic progressive neuropathy: Secondary | ICD-10-CM | POA: Diagnosis not present

## 2016-05-20 DIAGNOSIS — Z79891 Long term (current) use of opiate analgesic: Secondary | ICD-10-CM | POA: Diagnosis not present

## 2016-05-28 DIAGNOSIS — R21 Rash and other nonspecific skin eruption: Secondary | ICD-10-CM | POA: Diagnosis not present

## 2016-05-28 DIAGNOSIS — I10 Essential (primary) hypertension: Secondary | ICD-10-CM | POA: Diagnosis not present

## 2016-05-28 DIAGNOSIS — Z23 Encounter for immunization: Secondary | ICD-10-CM | POA: Diagnosis not present

## 2016-06-03 DIAGNOSIS — Z79891 Long term (current) use of opiate analgesic: Secondary | ICD-10-CM | POA: Diagnosis not present

## 2016-06-03 DIAGNOSIS — M1991 Primary osteoarthritis, unspecified site: Secondary | ICD-10-CM | POA: Diagnosis not present

## 2016-06-03 DIAGNOSIS — G603 Idiopathic progressive neuropathy: Secondary | ICD-10-CM | POA: Diagnosis not present

## 2016-06-03 DIAGNOSIS — G894 Chronic pain syndrome: Secondary | ICD-10-CM | POA: Diagnosis not present

## 2016-06-17 DIAGNOSIS — Z79891 Long term (current) use of opiate analgesic: Secondary | ICD-10-CM | POA: Diagnosis not present

## 2016-06-17 DIAGNOSIS — G603 Idiopathic progressive neuropathy: Secondary | ICD-10-CM | POA: Diagnosis not present

## 2016-06-17 DIAGNOSIS — M1991 Primary osteoarthritis, unspecified site: Secondary | ICD-10-CM | POA: Diagnosis not present

## 2016-06-17 DIAGNOSIS — G894 Chronic pain syndrome: Secondary | ICD-10-CM | POA: Diagnosis not present

## 2016-07-01 DIAGNOSIS — M1991 Primary osteoarthritis, unspecified site: Secondary | ICD-10-CM | POA: Diagnosis not present

## 2016-07-01 DIAGNOSIS — G894 Chronic pain syndrome: Secondary | ICD-10-CM | POA: Diagnosis not present

## 2016-07-01 DIAGNOSIS — Z79891 Long term (current) use of opiate analgesic: Secondary | ICD-10-CM | POA: Diagnosis not present

## 2016-07-01 DIAGNOSIS — G603 Idiopathic progressive neuropathy: Secondary | ICD-10-CM | POA: Diagnosis not present

## 2016-07-29 DIAGNOSIS — Z79891 Long term (current) use of opiate analgesic: Secondary | ICD-10-CM | POA: Diagnosis not present

## 2016-07-29 DIAGNOSIS — M1991 Primary osteoarthritis, unspecified site: Secondary | ICD-10-CM | POA: Diagnosis not present

## 2016-07-29 DIAGNOSIS — G894 Chronic pain syndrome: Secondary | ICD-10-CM | POA: Diagnosis not present

## 2016-07-29 DIAGNOSIS — G603 Idiopathic progressive neuropathy: Secondary | ICD-10-CM | POA: Diagnosis not present

## 2016-08-20 DIAGNOSIS — N401 Enlarged prostate with lower urinary tract symptoms: Secondary | ICD-10-CM | POA: Diagnosis not present

## 2016-08-20 DIAGNOSIS — N529 Male erectile dysfunction, unspecified: Secondary | ICD-10-CM | POA: Diagnosis not present

## 2016-08-21 DIAGNOSIS — N401 Enlarged prostate with lower urinary tract symptoms: Secondary | ICD-10-CM | POA: Diagnosis not present

## 2016-09-23 DIAGNOSIS — G894 Chronic pain syndrome: Secondary | ICD-10-CM | POA: Diagnosis not present

## 2016-09-23 DIAGNOSIS — M1991 Primary osteoarthritis, unspecified site: Secondary | ICD-10-CM | POA: Diagnosis not present

## 2016-09-23 DIAGNOSIS — Z79891 Long term (current) use of opiate analgesic: Secondary | ICD-10-CM | POA: Diagnosis not present

## 2016-09-23 DIAGNOSIS — G603 Idiopathic progressive neuropathy: Secondary | ICD-10-CM | POA: Diagnosis not present

## 2016-10-07 DIAGNOSIS — I10 Essential (primary) hypertension: Secondary | ICD-10-CM | POA: Diagnosis not present

## 2016-10-07 DIAGNOSIS — F411 Generalized anxiety disorder: Secondary | ICD-10-CM | POA: Diagnosis not present

## 2016-11-18 DIAGNOSIS — G603 Idiopathic progressive neuropathy: Secondary | ICD-10-CM | POA: Diagnosis not present

## 2016-11-18 DIAGNOSIS — G894 Chronic pain syndrome: Secondary | ICD-10-CM | POA: Diagnosis not present

## 2016-11-18 DIAGNOSIS — M1991 Primary osteoarthritis, unspecified site: Secondary | ICD-10-CM | POA: Diagnosis not present

## 2016-11-18 DIAGNOSIS — Z79891 Long term (current) use of opiate analgesic: Secondary | ICD-10-CM | POA: Diagnosis not present

## 2016-11-25 DIAGNOSIS — M1991 Primary osteoarthritis, unspecified site: Secondary | ICD-10-CM | POA: Diagnosis not present

## 2016-11-25 DIAGNOSIS — Z79891 Long term (current) use of opiate analgesic: Secondary | ICD-10-CM | POA: Diagnosis not present

## 2016-11-25 DIAGNOSIS — G894 Chronic pain syndrome: Secondary | ICD-10-CM | POA: Diagnosis not present

## 2016-11-25 DIAGNOSIS — G603 Idiopathic progressive neuropathy: Secondary | ICD-10-CM | POA: Diagnosis not present

## 2017-01-20 DIAGNOSIS — Z79891 Long term (current) use of opiate analgesic: Secondary | ICD-10-CM | POA: Diagnosis not present

## 2017-01-20 DIAGNOSIS — G603 Idiopathic progressive neuropathy: Secondary | ICD-10-CM | POA: Diagnosis not present

## 2017-01-20 DIAGNOSIS — M1991 Primary osteoarthritis, unspecified site: Secondary | ICD-10-CM | POA: Diagnosis not present

## 2017-01-20 DIAGNOSIS — G894 Chronic pain syndrome: Secondary | ICD-10-CM | POA: Diagnosis not present

## 2017-01-29 DIAGNOSIS — H25813 Combined forms of age-related cataract, bilateral: Secondary | ICD-10-CM | POA: Diagnosis not present

## 2017-02-16 DIAGNOSIS — N401 Enlarged prostate with lower urinary tract symptoms: Secondary | ICD-10-CM | POA: Diagnosis not present

## 2017-02-16 DIAGNOSIS — N529 Male erectile dysfunction, unspecified: Secondary | ICD-10-CM | POA: Diagnosis not present

## 2017-02-16 DIAGNOSIS — Z125 Encounter for screening for malignant neoplasm of prostate: Secondary | ICD-10-CM | POA: Diagnosis not present

## 2017-02-25 DIAGNOSIS — I10 Essential (primary) hypertension: Secondary | ICD-10-CM | POA: Diagnosis not present

## 2017-02-25 DIAGNOSIS — E7800 Pure hypercholesterolemia, unspecified: Secondary | ICD-10-CM | POA: Diagnosis not present

## 2017-02-25 DIAGNOSIS — G894 Chronic pain syndrome: Secondary | ICD-10-CM | POA: Diagnosis not present

## 2017-02-25 DIAGNOSIS — Z0000 Encounter for general adult medical examination without abnormal findings: Secondary | ICD-10-CM | POA: Diagnosis not present

## 2017-02-25 DIAGNOSIS — G629 Polyneuropathy, unspecified: Secondary | ICD-10-CM | POA: Diagnosis not present

## 2017-02-25 DIAGNOSIS — Z1389 Encounter for screening for other disorder: Secondary | ICD-10-CM | POA: Diagnosis not present

## 2017-02-25 DIAGNOSIS — M810 Age-related osteoporosis without current pathological fracture: Secondary | ICD-10-CM | POA: Diagnosis not present

## 2017-02-25 DIAGNOSIS — F411 Generalized anxiety disorder: Secondary | ICD-10-CM | POA: Diagnosis not present

## 2017-02-25 DIAGNOSIS — Z6825 Body mass index (BMI) 25.0-25.9, adult: Secondary | ICD-10-CM | POA: Diagnosis not present

## 2017-03-16 DIAGNOSIS — M810 Age-related osteoporosis without current pathological fracture: Secondary | ICD-10-CM | POA: Diagnosis not present

## 2017-03-17 DIAGNOSIS — G894 Chronic pain syndrome: Secondary | ICD-10-CM | POA: Diagnosis not present

## 2017-03-17 DIAGNOSIS — Z79891 Long term (current) use of opiate analgesic: Secondary | ICD-10-CM | POA: Diagnosis not present

## 2017-03-17 DIAGNOSIS — M1991 Primary osteoarthritis, unspecified site: Secondary | ICD-10-CM | POA: Diagnosis not present

## 2017-03-17 DIAGNOSIS — G603 Idiopathic progressive neuropathy: Secondary | ICD-10-CM | POA: Diagnosis not present

## 2017-05-12 DIAGNOSIS — G603 Idiopathic progressive neuropathy: Secondary | ICD-10-CM | POA: Diagnosis not present

## 2017-05-12 DIAGNOSIS — M1991 Primary osteoarthritis, unspecified site: Secondary | ICD-10-CM | POA: Diagnosis not present

## 2017-05-12 DIAGNOSIS — Z79891 Long term (current) use of opiate analgesic: Secondary | ICD-10-CM | POA: Diagnosis not present

## 2017-05-12 DIAGNOSIS — G894 Chronic pain syndrome: Secondary | ICD-10-CM | POA: Diagnosis not present

## 2017-05-21 DIAGNOSIS — I10 Essential (primary) hypertension: Secondary | ICD-10-CM | POA: Diagnosis not present

## 2017-05-21 DIAGNOSIS — M810 Age-related osteoporosis without current pathological fracture: Secondary | ICD-10-CM | POA: Diagnosis not present

## 2017-05-21 DIAGNOSIS — M1990 Unspecified osteoarthritis, unspecified site: Secondary | ICD-10-CM | POA: Diagnosis not present

## 2017-06-17 DIAGNOSIS — R252 Cramp and spasm: Secondary | ICD-10-CM | POA: Diagnosis not present

## 2017-06-17 DIAGNOSIS — Z7689 Persons encountering health services in other specified circumstances: Secondary | ICD-10-CM | POA: Diagnosis not present

## 2017-06-23 DIAGNOSIS — J690 Pneumonitis due to inhalation of food and vomit: Secondary | ICD-10-CM | POA: Diagnosis not present

## 2017-06-23 DIAGNOSIS — M549 Dorsalgia, unspecified: Secondary | ICD-10-CM | POA: Diagnosis not present

## 2017-06-23 DIAGNOSIS — N189 Chronic kidney disease, unspecified: Secondary | ICD-10-CM | POA: Diagnosis not present

## 2017-06-23 DIAGNOSIS — G629 Polyneuropathy, unspecified: Secondary | ICD-10-CM | POA: Diagnosis not present

## 2017-06-23 DIAGNOSIS — J9811 Atelectasis: Secondary | ICD-10-CM | POA: Diagnosis not present

## 2017-06-23 DIAGNOSIS — R0902 Hypoxemia: Secondary | ICD-10-CM | POA: Diagnosis not present

## 2017-06-23 DIAGNOSIS — R05 Cough: Secondary | ICD-10-CM | POA: Diagnosis not present

## 2017-06-23 DIAGNOSIS — R402441 Other coma, without documented Glasgow coma scale score, or with partial score reported, in the field [EMT or ambulance]: Secondary | ICD-10-CM | POA: Diagnosis not present

## 2017-06-23 DIAGNOSIS — R918 Other nonspecific abnormal finding of lung field: Secondary | ICD-10-CM | POA: Diagnosis not present

## 2017-06-23 DIAGNOSIS — F329 Major depressive disorder, single episode, unspecified: Secondary | ICD-10-CM | POA: Diagnosis not present

## 2017-06-23 DIAGNOSIS — N401 Enlarged prostate with lower urinary tract symptoms: Secondary | ICD-10-CM | POA: Diagnosis not present

## 2017-06-23 DIAGNOSIS — G92 Toxic encephalopathy: Secondary | ICD-10-CM | POA: Diagnosis not present

## 2017-06-23 DIAGNOSIS — Z86718 Personal history of other venous thrombosis and embolism: Secondary | ICD-10-CM | POA: Diagnosis not present

## 2017-06-23 DIAGNOSIS — G9340 Encephalopathy, unspecified: Secondary | ICD-10-CM | POA: Diagnosis not present

## 2017-06-23 DIAGNOSIS — F17210 Nicotine dependence, cigarettes, uncomplicated: Secondary | ICD-10-CM | POA: Diagnosis not present

## 2017-06-23 DIAGNOSIS — Z9689 Presence of other specified functional implants: Secondary | ICD-10-CM | POA: Diagnosis not present

## 2017-06-23 DIAGNOSIS — N179 Acute kidney failure, unspecified: Secondary | ICD-10-CM | POA: Diagnosis not present

## 2017-06-23 DIAGNOSIS — T40601A Poisoning by unspecified narcotics, accidental (unintentional), initial encounter: Secondary | ICD-10-CM | POA: Diagnosis not present

## 2017-06-23 DIAGNOSIS — I10 Essential (primary) hypertension: Secondary | ICD-10-CM | POA: Diagnosis not present

## 2017-06-23 DIAGNOSIS — N400 Enlarged prostate without lower urinary tract symptoms: Secondary | ICD-10-CM | POA: Diagnosis not present

## 2017-06-23 DIAGNOSIS — R339 Retention of urine, unspecified: Secondary | ICD-10-CM | POA: Diagnosis not present

## 2017-06-23 DIAGNOSIS — G8929 Other chronic pain: Secondary | ICD-10-CM | POA: Diagnosis not present

## 2017-06-23 DIAGNOSIS — T402X1A Poisoning by other opioids, accidental (unintentional), initial encounter: Secondary | ICD-10-CM | POA: Diagnosis not present

## 2017-06-23 DIAGNOSIS — E860 Dehydration: Secondary | ICD-10-CM | POA: Diagnosis not present

## 2017-06-23 DIAGNOSIS — R4182 Altered mental status, unspecified: Secondary | ICD-10-CM | POA: Diagnosis not present

## 2017-06-23 DIAGNOSIS — I129 Hypertensive chronic kidney disease with stage 1 through stage 4 chronic kidney disease, or unspecified chronic kidney disease: Secondary | ICD-10-CM | POA: Diagnosis not present

## 2017-06-23 DIAGNOSIS — R296 Repeated falls: Secondary | ICD-10-CM | POA: Diagnosis not present

## 2017-06-23 DIAGNOSIS — E7800 Pure hypercholesterolemia, unspecified: Secondary | ICD-10-CM | POA: Diagnosis not present

## 2017-07-01 ENCOUNTER — Other Ambulatory Visit: Payer: Self-pay | Admitting: *Deleted

## 2017-07-01 ENCOUNTER — Encounter: Payer: Self-pay | Admitting: *Deleted

## 2017-07-01 NOTE — Patient Outreach (Signed)
[facility] [facility]) Care Management  07/01/2017  William Jordan 19-May-1946 [id number]  Referral via [employer] insurance; discharged from [facility] 06/26/2017-determine if transition needs:  Telephone call to patient who was advised of reason for call & [facility] care management services. HIPPA verification received from patient.   Patient voices that he was recently in [facility] because of confusion & disorientation. States he is clear now. Voices he thinks problem was caused by new medication that he was taking-Flexeril. States he is no longer taking medication now,  States he has follow up appointments scheduled with pain management office-Dr. Nicholaus Bloom.  States he has talked with primary care office-Dr. Nona Dell and has recently has appointment with them. Advised patient of importance of following with primary care provider following hospital stay. He voices understanding. States spouse will provide transportation to medical appointments.   Patient states he is doing fine now & does not need case management services. Patient agrees to accept contact information.  Plan: Send contact [place] contact information to patient. Send MD closure letter.  Sherrin Daisy, RN BSN [place] Management Coordinator [facility] Care Management  [phone]

## 2017-07-02 DIAGNOSIS — G894 Chronic pain syndrome: Secondary | ICD-10-CM | POA: Diagnosis not present

## 2017-07-02 DIAGNOSIS — Z79891 Long term (current) use of opiate analgesic: Secondary | ICD-10-CM | POA: Diagnosis not present

## 2017-07-02 DIAGNOSIS — G603 Idiopathic progressive neuropathy: Secondary | ICD-10-CM | POA: Diagnosis not present

## 2017-07-02 DIAGNOSIS — M1991 Primary osteoarthritis, unspecified site: Secondary | ICD-10-CM | POA: Diagnosis not present

## 2017-07-14 DIAGNOSIS — Z4542 Encounter for adjustment and management of neuropacemaker (brain) (peripheral nerve) (spinal cord): Secondary | ICD-10-CM | POA: Diagnosis not present

## 2017-07-14 DIAGNOSIS — G894 Chronic pain syndrome: Secondary | ICD-10-CM | POA: Diagnosis not present

## 2017-08-13 DIAGNOSIS — M1991 Primary osteoarthritis, unspecified site: Secondary | ICD-10-CM | POA: Diagnosis not present

## 2017-08-13 DIAGNOSIS — Z79891 Long term (current) use of opiate analgesic: Secondary | ICD-10-CM | POA: Diagnosis not present

## 2017-08-13 DIAGNOSIS — G894 Chronic pain syndrome: Secondary | ICD-10-CM | POA: Diagnosis not present

## 2017-08-13 DIAGNOSIS — G603 Idiopathic progressive neuropathy: Secondary | ICD-10-CM | POA: Diagnosis not present

## 2017-08-26 DIAGNOSIS — G894 Chronic pain syndrome: Secondary | ICD-10-CM | POA: Diagnosis not present

## 2017-10-13 DIAGNOSIS — G894 Chronic pain syndrome: Secondary | ICD-10-CM | POA: Diagnosis not present

## 2017-10-13 DIAGNOSIS — M1991 Primary osteoarthritis, unspecified site: Secondary | ICD-10-CM | POA: Diagnosis not present

## 2017-10-13 DIAGNOSIS — Z79891 Long term (current) use of opiate analgesic: Secondary | ICD-10-CM | POA: Diagnosis not present

## 2017-10-13 DIAGNOSIS — G603 Idiopathic progressive neuropathy: Secondary | ICD-10-CM | POA: Diagnosis not present

## 2017-10-26 DIAGNOSIS — T85193A Other mechanical complication of implanted electronic neurostimulator, generator, initial encounter: Secondary | ICD-10-CM | POA: Diagnosis not present

## 2017-10-26 DIAGNOSIS — M961 Postlaminectomy syndrome, not elsewhere classified: Secondary | ICD-10-CM | POA: Diagnosis not present

## 2017-10-26 DIAGNOSIS — G894 Chronic pain syndrome: Secondary | ICD-10-CM | POA: Diagnosis not present

## 2017-10-26 DIAGNOSIS — T85695A Other mechanical complication of other nervous system device, implant or graft, initial encounter: Secondary | ICD-10-CM | POA: Diagnosis not present

## 2017-10-26 DIAGNOSIS — T85199A Other mechanical complication of other implanted electronic stimulator of nervous system, initial encounter: Secondary | ICD-10-CM | POA: Diagnosis not present

## 2017-11-09 DIAGNOSIS — G603 Idiopathic progressive neuropathy: Secondary | ICD-10-CM | POA: Diagnosis not present

## 2017-11-09 DIAGNOSIS — Z79891 Long term (current) use of opiate analgesic: Secondary | ICD-10-CM | POA: Diagnosis not present

## 2017-11-09 DIAGNOSIS — M1991 Primary osteoarthritis, unspecified site: Secondary | ICD-10-CM | POA: Diagnosis not present

## 2017-11-09 DIAGNOSIS — G894 Chronic pain syndrome: Secondary | ICD-10-CM | POA: Diagnosis not present

## 2017-12-07 DIAGNOSIS — Z79891 Long term (current) use of opiate analgesic: Secondary | ICD-10-CM | POA: Diagnosis not present

## 2017-12-07 DIAGNOSIS — M1991 Primary osteoarthritis, unspecified site: Secondary | ICD-10-CM | POA: Diagnosis not present

## 2017-12-07 DIAGNOSIS — G894 Chronic pain syndrome: Secondary | ICD-10-CM | POA: Diagnosis not present

## 2017-12-07 DIAGNOSIS — G603 Idiopathic progressive neuropathy: Secondary | ICD-10-CM | POA: Diagnosis not present

## 2017-12-10 ENCOUNTER — Emergency Department (HOSPITAL_COMMUNITY): Payer: PPO

## 2017-12-10 ENCOUNTER — Encounter (HOSPITAL_COMMUNITY): Payer: Self-pay | Admitting: Emergency Medicine

## 2017-12-10 ENCOUNTER — Other Ambulatory Visit: Payer: Self-pay

## 2017-12-10 ENCOUNTER — Inpatient Hospital Stay (HOSPITAL_COMMUNITY)
Admission: EM | Admit: 2017-12-10 | Discharge: 2017-12-12 | DRG: 682 | Disposition: A | Discharge status: Home / Self Care | Payer: PPO | Attending: Family Medicine | Admitting: Family Medicine

## 2017-12-10 DIAGNOSIS — F1110 Opioid abuse, uncomplicated: Secondary | ICD-10-CM | POA: Diagnosis not present

## 2017-12-10 DIAGNOSIS — Z96641 Presence of right artificial hip joint: Secondary | ICD-10-CM | POA: Diagnosis not present

## 2017-12-10 DIAGNOSIS — R197 Diarrhea, unspecified: Secondary | ICD-10-CM | POA: Insufficient documentation

## 2017-12-10 DIAGNOSIS — D649 Anemia, unspecified: Secondary | ICD-10-CM | POA: Diagnosis present

## 2017-12-10 DIAGNOSIS — J9601 Acute respiratory failure with hypoxia: Secondary | ICD-10-CM | POA: Diagnosis not present

## 2017-12-10 DIAGNOSIS — R1031 Right lower quadrant pain: Secondary | ICD-10-CM | POA: Diagnosis not present

## 2017-12-10 DIAGNOSIS — G894 Chronic pain syndrome: Secondary | ICD-10-CM | POA: Diagnosis not present

## 2017-12-10 DIAGNOSIS — F419 Anxiety disorder, unspecified: Secondary | ICD-10-CM | POA: Diagnosis present

## 2017-12-10 DIAGNOSIS — G9341 Metabolic encephalopathy: Secondary | ICD-10-CM | POA: Diagnosis not present

## 2017-12-10 DIAGNOSIS — M549 Dorsalgia, unspecified: Secondary | ICD-10-CM | POA: Diagnosis not present

## 2017-12-10 DIAGNOSIS — Z96611 Presence of right artificial shoulder joint: Secondary | ICD-10-CM | POA: Diagnosis not present

## 2017-12-10 DIAGNOSIS — G9340 Encephalopathy, unspecified: Secondary | ICD-10-CM | POA: Diagnosis present

## 2017-12-10 DIAGNOSIS — N289 Disorder of kidney and ureter, unspecified: Secondary | ICD-10-CM

## 2017-12-10 DIAGNOSIS — Z8679 Personal history of other diseases of the circulatory system: Secondary | ICD-10-CM

## 2017-12-10 DIAGNOSIS — N179 Acute kidney failure, unspecified: Secondary | ICD-10-CM | POA: Diagnosis present

## 2017-12-10 DIAGNOSIS — Z885 Allergy status to narcotic agent status: Secondary | ICD-10-CM | POA: Diagnosis not present

## 2017-12-10 DIAGNOSIS — Z79899 Other long term (current) drug therapy: Secondary | ICD-10-CM

## 2017-12-10 DIAGNOSIS — J9811 Atelectasis: Secondary | ICD-10-CM | POA: Diagnosis not present

## 2017-12-10 DIAGNOSIS — Z96652 Presence of left artificial knee joint: Secondary | ICD-10-CM | POA: Diagnosis present

## 2017-12-10 DIAGNOSIS — F17290 Nicotine dependence, other tobacco product, uncomplicated: Secondary | ICD-10-CM | POA: Diagnosis present

## 2017-12-10 DIAGNOSIS — T40601A Poisoning by unspecified narcotics, accidental (unintentional), initial encounter: Secondary | ICD-10-CM | POA: Diagnosis present

## 2017-12-10 DIAGNOSIS — Y92009 Unspecified place in unspecified non-institutional (private) residence as the place of occurrence of the external cause: Secondary | ICD-10-CM

## 2017-12-10 DIAGNOSIS — I10 Essential (primary) hypertension: Secondary | ICD-10-CM | POA: Diagnosis present

## 2017-12-10 DIAGNOSIS — R5383 Other fatigue: Secondary | ICD-10-CM | POA: Diagnosis not present

## 2017-12-10 DIAGNOSIS — R404 Transient alteration of awareness: Secondary | ICD-10-CM | POA: Diagnosis not present

## 2017-12-10 DIAGNOSIS — R4182 Altered mental status, unspecified: Secondary | ICD-10-CM | POA: Diagnosis not present

## 2017-12-10 DIAGNOSIS — R531 Weakness: Secondary | ICD-10-CM | POA: Diagnosis not present

## 2017-12-10 DIAGNOSIS — T424X1A Poisoning by benzodiazepines, accidental (unintentional), initial encounter: Secondary | ICD-10-CM | POA: Diagnosis present

## 2017-12-10 LAB — COMPREHENSIVE METABOLIC PANEL
ALBUMIN: 3.6 g/dL (ref 3.5–5.0)
ALK PHOS: 83 U/L (ref 38–126)
ALT: 16 U/L — AB (ref 17–63)
AST: 38 U/L (ref 15–41)
Anion gap: 13 (ref 5–15)
BUN: 34 mg/dL — AB (ref 6–20)
CALCIUM: 8.3 mg/dL — AB (ref 8.9–10.3)
CO2: 24 mmol/L (ref 22–32)
CREATININE: 4.35 mg/dL — AB (ref 0.61–1.24)
Chloride: 93 mmol/L — ABNORMAL LOW (ref 101–111)
GFR calc Af Amer: 15 mL/min — ABNORMAL LOW (ref >60)
GFR calc non Af Amer: 13 mL/min — ABNORMAL LOW (ref >60)
GLUCOSE: 103 mg/dL — AB (ref 65–99)
Potassium: 4.9 mmol/L (ref 3.5–5.1)
SODIUM: 130 mmol/L — AB (ref 135–145)
Total Bilirubin: 0.9 mg/dL (ref 0.3–1.2)
Total Protein: 6.1 g/dL — ABNORMAL LOW (ref 6.5–8.1)

## 2017-12-10 LAB — RAPID URINE DRUG SCREEN, HOSP PERFORMED
AMPHETAMINES: NOT DETECTED
BENZODIAZEPINES: POSITIVE — AB
Barbiturates: NOT DETECTED
COCAINE: NOT DETECTED
OPIATES: POSITIVE — AB
TETRAHYDROCANNABINOL: NOT DETECTED

## 2017-12-10 LAB — I-STAT CG4 LACTIC ACID, ED
LACTIC ACID, VENOUS: 0.93 mmol/L (ref 0.5–1.9)
Lactic Acid, Venous: 1.29 mmol/L (ref 0.5–1.9)

## 2017-12-10 LAB — CBC
HCT: 31.3 % — ABNORMAL LOW (ref 39.0–52.0)
Hemoglobin: 10.1 g/dL — ABNORMAL LOW (ref 13.0–17.0)
MCH: 28.9 pg (ref 26.0–34.0)
MCHC: 32.3 g/dL (ref 30.0–36.0)
MCV: 89.7 fL (ref 78.0–100.0)
PLATELETS: 197 10*3/uL (ref 150–400)
RBC: 3.49 MIL/uL — ABNORMAL LOW (ref 4.22–5.81)
RDW: 14 % (ref 11.5–15.5)
WBC: 9.2 10*3/uL (ref 4.0–10.5)

## 2017-12-10 LAB — ACETAMINOPHEN LEVEL: Acetaminophen (Tylenol), Serum: <10 ug/mL — ABNORMAL LOW (ref 10–30)

## 2017-12-10 LAB — URINALYSIS, ROUTINE W REFLEX MICROSCOPIC
Bilirubin Urine: NEGATIVE
Glucose, UA: NEGATIVE mg/dL
HGB URINE DIPSTICK: NEGATIVE
KETONES UR: NEGATIVE mg/dL
LEUKOCYTES UA: NEGATIVE
Nitrite: NEGATIVE
PH: 5 (ref 5.0–8.0)
Protein, ur: NEGATIVE mg/dL
Specific Gravity, Urine: 1.018 (ref 1.005–1.030)

## 2017-12-10 LAB — I-STAT TROPONIN, ED: TROPONIN I, POC: 0 ng/mL (ref 0.00–0.08)

## 2017-12-10 LAB — ETHANOL

## 2017-12-10 LAB — SALICYLATE LEVEL: Salicylate Lvl: <7 mg/dL (ref 2.8–30.0)

## 2017-12-10 LAB — BRAIN NATRIURETIC PEPTIDE: B Natriuretic Peptide: 138.2 pg/mL — ABNORMAL HIGH (ref 0.0–100.0)

## 2017-12-10 LAB — CBG MONITORING, ED: GLUCOSE-CAPILLARY: 101 mg/dL — AB (ref 65–99)

## 2017-12-10 MED ORDER — BISACODYL 5 MG PO TBEC: 5.0000 mg | DELAYED_RELEASE_TABLET | Freq: Every day | ORAL | Status: DC | PRN

## 2017-12-10 MED ORDER — SODIUM CHLORIDE 0.9% FLUSH
3.0000 mL | Freq: Two times a day (BID) | INTRAVENOUS | Status: DC
  Administered 2017-12-12: 3 mL via INTRAVENOUS

## 2017-12-10 MED ORDER — GABAPENTIN 100 MG PO CAPS
200.0000 mg | ORAL_CAPSULE | Freq: Every day | ORAL | Status: DC
  Administered 2017-12-11 – 2017-12-12 (×2): 200 mg via ORAL
  Filled 2017-12-10 (×2): qty 2

## 2017-12-10 MED ORDER — NALOXONE HCL 0.4 MG/ML IJ SOLN
INTRAMUSCULAR | Status: AC
  Filled 2017-12-10: qty 1

## 2017-12-10 MED ORDER — HEPARIN SODIUM (PORCINE) 5000 UNIT/ML IJ SOLN
5000.0000 [IU] | Freq: Three times a day (TID) | INTRAMUSCULAR | Status: DC
  Administered 2017-12-10 – 2017-12-12 (×4): 5000 [IU] via SUBCUTANEOUS
  Filled 2017-12-10 (×4): qty 1

## 2017-12-10 MED ORDER — NALOXONE HCL 0.4 MG/ML IJ SOLN
0.2000 mg | Freq: Once | INTRAMUSCULAR | Status: AC
  Administered 2017-12-10: 0.2 mg via INTRAVENOUS
  Filled 2017-12-10: qty 1

## 2017-12-10 MED ORDER — SODIUM CHLORIDE 0.9 % IV BOLUS
1000.0000 mL | Freq: Once | INTRAVENOUS | Status: AC
  Administered 2017-12-10: 1000 mL via INTRAVENOUS

## 2017-12-10 MED ORDER — SENNOSIDES-DOCUSATE SODIUM 8.6-50 MG PO TABS: 1.0000 | ORAL_TABLET | Freq: Every evening | ORAL | Status: DC | PRN

## 2017-12-10 MED ORDER — ONDANSETRON HCL 4 MG PO TABS: 4.0000 mg | ORAL_TABLET | Freq: Four times a day (QID) | ORAL | Status: DC | PRN

## 2017-12-10 MED ORDER — LACTATED RINGERS IV SOLN
INTRAVENOUS | Status: DC
  Administered 2017-12-10: 17:00:00 via INTRAVENOUS

## 2017-12-10 MED ORDER — PANTOPRAZOLE SODIUM 40 MG PO TBEC
40.0000 mg | DELAYED_RELEASE_TABLET | Freq: Every day | ORAL | Status: DC
  Administered 2017-12-11 – 2017-12-12 (×2): 40 mg via ORAL
  Filled 2017-12-10 (×2): qty 1

## 2017-12-10 MED ORDER — NALOXONE HCL 0.4 MG/ML IJ SOLN: 0.2000 mg | INTRAMUSCULAR | Status: DC | PRN

## 2017-12-10 MED ORDER — ONDANSETRON HCL 4 MG/2ML IJ SOLN: 4.0000 mg | Freq: Four times a day (QID) | INTRAMUSCULAR | Status: DC | PRN

## 2017-12-10 MED ORDER — CITALOPRAM HYDROBROMIDE 20 MG PO TABS
20.0000 mg | ORAL_TABLET | Freq: Every day | ORAL | Status: DC
  Administered 2017-12-11 – 2017-12-12 (×2): 20 mg via ORAL
  Filled 2017-12-10: qty 1
  Filled 2017-12-10: qty 2

## 2017-12-10 MED ORDER — ACETAMINOPHEN 325 MG PO TABS: 650.0000 mg | ORAL_TABLET | Freq: Four times a day (QID) | ORAL | Status: DC | PRN

## 2017-12-10 MED ORDER — NALOXONE HCL 0.4 MG/ML IJ SOLN
0.4000 mg | Freq: Once | INTRAMUSCULAR | Status: AC
  Administered 2017-12-10: 0.4 mg via INTRAVENOUS

## 2017-12-10 MED ORDER — SODIUM CHLORIDE 0.9 % IV SOLN
INTRAVENOUS | Status: AC
  Administered 2017-12-10 – 2017-12-11 (×2): 1000 mL via INTRAVENOUS

## 2017-12-10 MED ORDER — ACETAMINOPHEN 650 MG RE SUPP: 650.0000 mg | Freq: Four times a day (QID) | RECTAL | Status: DC | PRN

## 2017-12-10 NOTE — ED Triage Notes (Signed)
Pt in from home via [facility] with increased sleepiness and lethargy x 2 days per wife. EMS states the pt takes Oxycodone - reports taking 2 today. Sats were 70% on RA per EMS - placed on 3LNC, sats now 94%. Pupils pinpoint, nonreactive. Pt reporting diarrhea x 2 days, c/o RLQ pain BP 102/60

## 2017-12-10 NOTE — ED Notes (Signed)
MD at bedside. 

## 2017-12-10 NOTE — ED Notes (Signed)
Pt's "stimulator" (nerve) taken out of pt's pocket and given to pt's wife

## 2017-12-10 NOTE — ED Notes (Signed)
Pt's sats noted to be in the 80's; pt placed back on 2 L O2 via [place]; sats now in the 90s

## 2017-12-10 NOTE — ED Notes (Signed)
Pt alert after being given narcan

## 2017-12-10 NOTE — ED Notes (Signed)
CBG 101 

## 2017-12-10 NOTE — H&P (Signed)
History and Physical    BABATUNDE SEAGO WUJ:[id number] DOB: 01-19-1946 DOA: 12/10/2017  PCP: [name], MD   Patient coming from: Home  Chief Complaint: Lethargy   HPI: MICHEIL KLAUS is a 72 y.o. male with medical history significant for chronic pain, anxiety, and hypertension, now presenting to the emergency department with 2 days of lethargy.  Patient is accompanied by family who assist with the history.  He had reportedly been in his usual state of health until becoming somnolent 2 days ago.  He has been experiencing some intermittent right lower quadrant pain, but denies vomiting or diarrhea despite an earlier note suggesting this; his family has also not seen any vomiting or diarrhea.  Patient has been sleeping for the majority of the past 48 hours, has woken briefly a few times, ate some, took his oxycodone, and go back to sleep.  Family has been able to wake him, but he will not stay awake for long.  Patient denies any chest pain, palpitations, cough, shortness of breath, fevers, or chills.  He denies any abdominal pain at this time, but reports intermittent pains in the right lower quadrant.  EMS was called by the patient's family and they report finding the patient to be saturating 70% on room air with pinpoint pupils.  ED Course: Upon arrival to the ED, patient is found to be slightly tachycardic, mildly tachypneic, blood pressure 94/40, and saturating low 90s on 2 L/min of supplemental oxygen.  Patient was lethargic with pinpoint pupils, and had a dramatic recovery with 0.4 mg of Narcan.  EKG features a sinus rhythm with low voltage QRS and nonspecific T wave abnormalities.  Chest x-ray is notable for hypoventilation and bibasilar atelectasis.  CT the abdomen pelvis is negative for acute intra-abdominal or pelvic abnormalities, but notable for subtle increased attenuation at the lower pole of the right kidney, possibly reflecting a hemorrhagic cyst or neoplasia.  Chemistry panel features sodium 130  and creatinine of 4.35, up from 0.8 last October.  CBC features a chronic stable normocytic anemia with hemoglobin of 10.1.  Lactic acid is reassuring, troponin is undetectable, BNP slightly elevated, UDS positive for benzodiazepines and opiates, and urinalysis unremarkable.  Acetaminophen, salicylate, and ethanol levels are undetectable.  Patient became somnolent again in the emergency department and was treated with a repeat dose of Narcan, again with improvement.  He will be admitted to the stepdown unit for ongoing evaluation and management of suspected opiate overdose and acute renal failure.  Review of Systems:  All other systems reviewed and apart from HPI, are negative.  Past Medical History:  Diagnosis Date  . Anxiety    takes Celexa  . Arthritis   . Chronic back pain   . Enlarged prostate   . History of blood transfusion   . Hypertension   . Neuropathy     Past Surgical History:  Procedure Laterality Date  . COLONOSCOPY     one polyp removed - benign  . ELBOW SURGERY Right   . HERNIA REPAIR     umbilical  . JOINT REPLACEMENT Left    knee, x 2  . JOINT REPLACEMENT Right    hip  . JOINT REPLACEMENT Left    partial shoulder  . SPINAL CORD STIMULATOR INSERTION N/A 09/28/2014   Procedure: LUMBAR SPINAL CORD STIMULATOR INSERTION;  Surgeon: Melina Schools, MD;  Location: [facility];  Service: Orthopedics;  Laterality: N/A;  . TONSILLECTOMY    . VASECTOMY       reports  that he has been smoking e-cigarettes.  He has quit using smokeless tobacco. His smokeless tobacco use included snuff. He reports that he does not drink alcohol or use drugs.  Allergies  Allergen Reactions  . Morphine Other (See Comments)    Causes confusion and "makes me crazy," per the patient    Family History  Problem Relation Age of Onset  . Heart disease Father      Prior to Admission medications   Medication Sig Start Date End Date Taking? Authorizing Provider  benazepril (LOTENSIN) 20 MG tablet  Take 20 mg by mouth daily.   Yes [provider]  citalopram (CELEXA) 20 MG tablet Take 20 mg by mouth daily.   Yes [provider]  colchicine 0.6 MG tablet Take 0.6 mg by mouth 3 (three) times daily as needed (as directed FOR GOUT FLARES).    Yes [provider]  docusate sodium (COLACE) 100 MG capsule Take 2 capsules (200 mg total) by mouth at bedtime. Patient taking differently: Take 100 mg by mouth at bedtime.  10/02/15  Yes Debbe Odea, MD  gabapentin (NEURONTIN) 300 MG capsule Take 300 mg by mouth 3 (three) times daily.    Yes [provider]  ibuprofen (ADVIL,MOTRIN) 800 MG tablet Take 800 mg by mouth every 6 (six) hours as needed (for pain).    Yes [provider]  omeprazole (PRILOSEC) 20 MG capsule Take 20 mg by mouth daily as needed for indigestion.   Yes [provider]  Oxycodone HCl 20 MG TABS Take 20 mg by mouth every 4-6 hours as needed for pain   Yes [provider]  polyethylene glycol (MIRALAX / GLYCOLAX) packet Take 17 g by mouth daily. Patient taking differently: Take 17 g by mouth daily as needed for mild constipation or moderate constipation.  06/12/15  Yes Eugenie Filler, MD  Saw Palmetto, Serenoa repens, (SAW PALMETTO PO) Take 1 capsule by mouth daily.   Yes [provider]  vitamin B-12 (CYANOCOBALAMIN) 100 MCG tablet Take 1 tablet (100 mcg total) by mouth daily. Patient taking differently: Take 100 mcg by mouth 2 (two) times a week.  08/17/15  Yes Caren Griffins, MD  celecoxib (CELEBREX) 100 MG capsule Take 100 mg by mouth 2 (two) times daily as needed (FOR INFLAMMATION).     [provider]  morphine (MS CONTIN) 15 MG 12 hr tablet Take 1 tablet (15 mg total) by mouth every 12 (twelve) hours. Patient not taking: Reported on 12/10/2017 10/02/15   Debbe Odea, MD    Physical Exam: Vitals:   12/10/17 1645 12/10/17 1715 12/10/17 1745 12/10/17 1800  BP: 97/62 120/65 112/76 (!) 119/59    Pulse: (!) 103 (!) 114 98 (!) 108  Resp: 19 (!) 23 14 13   SpO2: 90% 91% 91% 94%  Weight:          Constitutional: NAD, calm  Eyes: PERTLA, lids and conjunctivae normal ENMT: Mucous membranes are moist. Posterior pharynx clear of any exudate or lesions.   Neck: normal, supple, no masses, no thyromegaly Respiratory: clear to auscultation bilaterally, no wheezing, no crackles. Normal respiratory effort.   Cardiovascular: S1 & S2 heard, regular rate and rhythm. No extremity edema. No significant JVD. Abdomen: No distension, no tenderness, soft. Bowel sounds active.  Musculoskeletal: no clubbing / cyanosis. No joint deformity upper and lower extremities.   Skin: no significant rashes, lesions, ulcers. Warm, dry, well-perfused. Neurologic: No facial asymmetry. Sensation to light touch intact, patellar DTR normal.  Strength 5/5 in all 4 limbs.  Psychiatric: Alert after Narcan, oriented x 3. Calm and cooperative.     Labs on Admission: I have personally reviewed following labs and imaging studies  CBC: Recent Labs  Lab 12/10/17 1507  WBC 9.2  HGB 10.1*  HCT 31.3*  MCV 89.7  PLT 824   Basic Metabolic Panel: Recent Labs  Lab 12/10/17 1507  NA 130*  K 4.9  CL 93*  CO2 24  GLUCOSE 103*  BUN 34*  CREATININE 4.35*  CALCIUM 8.3*   GFR: CrCl cannot be calculated (Unknown ideal weight.). Liver Function Tests: Recent Labs  Lab 12/10/17 1507  AST 38  ALT 16*  ALKPHOS 83  BILITOT 0.9  PROT 6.1*  ALBUMIN 3.6   No results for input(s): LIPASE, AMYLASE in the last 168 hours. No results for input(s): AMMONIA in the last 168 hours. Coagulation Profile: No results for input(s): INR, PROTIME in the last 168 hours. Cardiac Enzymes: No results for input(s): CKTOTAL, CKMB, CKMBINDEX, TROPONINI in the last 168 hours. BNP (last 3 results) No results for input(s): PROBNP in the last 8760 hours. HbA1C: No results for input(s): HGBA1C in the last 72 hours. CBG: Recent Labs  Lab  12/10/17 1517  GLUCAP 101*   Lipid Profile: No results for input(s): CHOL, HDL, LDLCALC, TRIG, CHOLHDL, LDLDIRECT in the last 72 hours. Thyroid Function Tests: No results for input(s): TSH, T4TOTAL, FREET4, T3FREE, THYROIDAB in the last 72 hours. Anemia Panel: No results for input(s): VITAMINB12, FOLATE, FERRITIN, TIBC, IRON, RETICCTPCT in the last 72 hours. Urine analysis:    Component Value Date/Time   COLORURINE YELLOW 12/10/2017 1822   APPEARANCEUR HAZY (A) 12/10/2017 1822   LABSPEC 1.018 12/10/2017 1822   PHURINE 5.0 12/10/2017 1822   GLUCOSEU NEGATIVE 12/10/2017 1822   HGBUR NEGATIVE 12/10/2017 1822   BILIRUBINUR NEGATIVE 12/10/2017 1822   KETONESUR NEGATIVE 12/10/2017 1822   PROTEINUR NEGATIVE 12/10/2017 1822   UROBILINOGEN 0.2 06/16/2015 0222   NITRITE NEGATIVE 12/10/2017 1822   LEUKOCYTESUR NEGATIVE 12/10/2017 1822   Sepsis Labs: @LABRCNTIP (procalcitonin:4,lacticidven:4) ) Recent Results (from the past 240 hour(s))  Blood culture (routine x 2)     Status: None (Preliminary result)   Collection Time: 12/10/17  4:15 PM  Result Value Ref Range Status   Specimen Description BLOOD RIGHT HAND  Final   Special Requests   Final    BOTTLES DRAWN AEROBIC AND ANAEROBIC Blood Culture results may not be optimal due to an excessive volume of blood received in culture bottles   Culture PENDING  Incomplete   Report Status PENDING  Incomplete     Radiological Exams on Admission: Ct Abdomen Pelvis Wo Contrast  Result Date: 12/10/2017 CLINICAL DATA:  Right lower quadrant pain and diarrhea for 2 days. Clinical suspicion for appendicitis. EXAM: CT ABDOMEN AND PELVIS WITHOUT CONTRAST TECHNIQUE: Multidetector CT imaging of the abdomen and pelvis was performed following the standard protocol without IV contrast. COMPARISON:  06/08/2015 FINDINGS: Lower chest: No acute findings. Hepatobiliary: No mass visualized on this unenhanced exam. Gallbladder is unremarkable. Pancreas: No mass or  inflammatory process visualized on this unenhanced exam. Spleen:  Within normal limits in size. Adrenals/Urinary tract: No evidence of urolithiasis or hydronephrosis. A subtle area of increased attenuation is seen in the lower pole the right kidney measuring approximately 3 cm. This could represent a hemorrhagic cyst or solid renal neoplasm. Unremarkable unopacified urinary bladder. Stomach/Bowel: No evidence of obstruction, inflammatory process, or abnormal fluid collections. The appendix is not visualized, and  there appear to be surgical clips at site of prior appendectomy. No inflammatory process seen in region of the cecum or elsewhere. Vascular/Lymphatic: No pathologically enlarged lymph nodes identified. No evidence of abdominal aortic aneurysm. Aortic atherosclerosis. Reproductive:  No mass or other significant abnormality. Other: Artifact from right hip prosthesis obscures visualization of the inferior pelvis. Musculoskeletal: No suspicious bone lesions identified. Neurostimulator device is seen with leads in the lower thoracic spinal canal. IMPRESSION: No radiographic evidence of appendicitis or other acute findings. Subtle area of increased attenuation in lower pole of right kidney, which could represent a hemorrhagic cyst or a solid renal neoplasm. Nonemergent abdomen MRI without and with contrast is recommended for further evaluation on an outpatient basis. Electronically Signed   By: Earle Gell M.D.   On: 12/10/2017 19:09   Dg Chest Port 1 View  Result Date: 12/10/2017 CLINICAL DATA:  Altered mental status.  Hypoxia EXAM: PORTABLE CHEST 1 VIEW COMPARISON:  06/25/2017 FINDINGS: Hypoventilation with bibasilar atelectasis. Negative for heart failure or pneumonia. No effusion. Spinal cord stimulator in the thoracic spine unchanged. Left shoulder replacement. IMPRESSION: Hypoventilation with bibasilar atelectasis. Electronically Signed   By: Franchot Gallo M.D.   On: 12/10/2017 15:30    EKG:  Independently reviewed. Sinus rhythm, non-specific T-wave abnormalities.   Assessment/Plan   1. Acute kidney injury  - SCr is 4.35 on admission, up from 0.8 last October  - Denies vomiting or diarrhea, reports good appetite but had slept most of the day the last two days so didn't eat or drink much  - CT abd/pelvis with subtle right lower pole kidney lesion, possibly hemorrhagic cyst vs neoplasia, but no obstructive process  - May be a combination of poor oral intake and his nephrotoxic medications; BP was also low on arrival   - Treated in ED with 1 liter NS  - Continue IVF hydration, check urine chemistries, hold ACE and NSAIDs, repeat chem panel in am    2. Opiate overdose; ?benzodiazepine misuse; chronic pain   - Presents with 2 days of lethargy, found to have pinpoint pupils and difficult to rouse  - Mental status improved dramatically with Narcan in ED; required a second dose, again with marked improvement  - Possibly secondary to acute renal failure and poor clearance  - Hold opiates, continue prn Narcan  - UDS is also notably positive for benzodiazepine, though none appear on med list and patient denies  3. Acute hypoxic respiratory failure  - Was saturating 70% with EMS  - No dyspnea or cough, CXR with hypoventilation and atelectasis  - Secondary to opiate overdose, improved with Narcan  - Continue supportive care, avoid narcotic, use prn Narcan    4. Abdominal pain  - Reports intermittent RLQ abdominal pain  - No fever or leukocytosis, denies diarrhea or vomiting  - CT abd/pelvis with no acute findings  - Abdominal exam is benign  - Continue supportive care    5. History of hypertension  - BP low in ED  - Hold benazepril     DVT prophylaxis: sq heparin  Code Status: Full  Family Communication: Family updated at bedside Consults called: None Admission status: Inpatient    [name], MD [facility] Pager [phone]  If 7PM-7AM, please contact  night-coverage [email or web address] Password [facility]  12/10/2017, 7:54 PM

## 2017-12-10 NOTE — ED Provider Notes (Signed)
[facility] Provider Note   CSN: [id number] Arrival date & time: 12/10/17  1445     History   Chief Complaint Chief Complaint  Patient presents with  . Altered Mental Status    HPI William Jordan is a 72 y.o. male.  The history is provided by the patient and the EMS personnel.  Altered Mental Status   This is a new problem. The current episode started yesterday. The problem has been gradually worsening. Associated symptoms include confusion and somnolence. Risk factors: chronic pain, takes opioid pain pills.  Patient reportedly has been having increased lethargy last 2 days per the wife.  Patient really took 2 of his oxycodone today.  With EMS his O2 sat was initially 70% on room air and he was placed on 3 L of nasal cannula with improvement in sats in the mid 90s.  He is also had couple days of diarrhea and right lower quadrant pain which is new. -After patient was given Narcan a took another history with the patient.  He is actually denying any diarrhea or any shortness of breath.  He states without palpation he does not feel any abdominal pain.  He states today he did feel worsening of his chronic back pain.  He admits to taking 20 mg of oxycodone yesterday and 40 mg of oxycodone today.  Past Medical History:  Diagnosis Date  . Anxiety    takes Celexa  . Arthritis   . Chronic back pain   . Enlarged prostate   . History of blood transfusion   . Hypertension   . Neuropathy     Patient Active Problem List   Diagnosis Date Noted  . Opiate overdose ([place]) 12/10/2017  . Acute kidney injury ([place]) 12/10/2017  . Kidney lesion, native, right 12/10/2017  . Abuse, drug or alcohol ([place]) 10/05/2015  . PNA (pneumonia) 10/05/2015  . Bacteremia due to Staphylococcus aureus 10/05/2015  . Chronic pain syndrome 10/02/2015  . Acute respiratory failure with hypoxia ([place])   . Aspiration pneumonia ([place])   . Hematemesis   . Respiratory failure ([place])  09/26/2015  . Encephalopathy, unspecified   . Chronic back pain   . Muscle spasm of back   . Altered mental status 08/14/2015  . AKI (acute kidney injury) ([place]) 08/14/2015  . Anemia 08/14/2015  . Altered mental state 08/14/2015  . Pain in left wrist   . Gout attack 06/18/2015  . Septic joint of left wrist ([place]) 06/16/2015  . History of hypertension 06/16/2015  . Leukocytosis 06/16/2015  . Septic joint ([place]) 06/16/2015  . Acute right ankle pain 06/10/2015  . HCAP (healthcare-associated pneumonia) 06/09/2015  . Hypokalemia 06/09/2015  . Acute renal failure syndrome ([place])   . Acute encephalopathy   . Staphylococcus aureus bacteremia with sepsis ([place]) 06/07/2015  . Constipation 06/06/2015  . Toxic metabolic encephalopathy 73/41/9379  . Hyperkalemia 06/05/2015  . Low back pain with sciatica 04/03/2015  . H/O total hip arthroplasty 04/03/2015  . Idiopathic peripheral neuropathy 09/29/2014  . Chronic pain 09/28/2014  . DDD (degenerative disc disease), lumbar 12/30/2012  . Degenerative arthritis of thoracic spine 12/30/2012  . Localized osteoarthrosis 12/13/2012  . Chronic pain associated with significant psychosocial dysfunction 12/13/2012  . Polypharmacy 12/13/2012  . Long term current use of opiate analgesic 12/13/2012    Past Surgical History:  Procedure Laterality Date  . COLONOSCOPY     one polyp removed - benign  . ELBOW SURGERY Right   . HERNIA REPAIR  umbilical  . JOINT REPLACEMENT Left    knee, x 2  . JOINT REPLACEMENT Right    hip  . JOINT REPLACEMENT Left    partial shoulder  . SPINAL CORD STIMULATOR INSERTION N/A 09/28/2014   Procedure: LUMBAR SPINAL CORD STIMULATOR INSERTION;  Surgeon: Melina Schools, MD;  Location: [facility];  Service: Orthopedics;  Laterality: N/A;  . TONSILLECTOMY    . VASECTOMY          Home Medications    Prior to Admission medications   Medication Sig Start Date End Date Taking? Authorizing Provider  benazepril (LOTENSIN) 20 MG  tablet Take 20 mg by mouth daily.   Yes [provider]  citalopram (CELEXA) 20 MG tablet Take 20 mg by mouth daily.   Yes [provider]  colchicine 0.6 MG tablet Take 0.6 mg by mouth 3 (three) times daily as needed (as directed FOR GOUT FLARES).    Yes [provider]  docusate sodium (COLACE) 100 MG capsule Take 2 capsules (200 mg total) by mouth at bedtime. Patient taking differently: Take 100 mg by mouth at bedtime.  10/02/15  Yes Debbe Odea, MD  gabapentin (NEURONTIN) 300 MG capsule Take 300 mg by mouth 3 (three) times daily.    Yes [provider]  ibuprofen (ADVIL,MOTRIN) 800 MG tablet Take 800 mg by mouth every 6 (six) hours as needed (for pain).    Yes [provider]  omeprazole (PRILOSEC) 20 MG capsule Take 20 mg by mouth daily as needed for indigestion.   Yes [provider]  Oxycodone HCl 20 MG TABS Take 20 mg by mouth every 4-6 hours as needed for pain   Yes [provider]  polyethylene glycol (MIRALAX / GLYCOLAX) packet Take 17 g by mouth daily. Patient taking differently: Take 17 g by mouth daily as needed for mild constipation or moderate constipation.  06/12/15  Yes Eugenie Filler, MD  Saw Palmetto, Serenoa repens, (SAW PALMETTO PO) Take 1 capsule by mouth daily.   Yes [provider]  vitamin B-12 (CYANOCOBALAMIN) 100 MCG tablet Take 1 tablet (100 mcg total) by mouth daily. Patient taking differently: Take 100 mcg by mouth 2 (two) times a week.  08/17/15  Yes Caren Griffins, MD  celecoxib (CELEBREX) 100 MG capsule Take 100 mg by mouth 2 (two) times daily as needed (FOR INFLAMMATION).     [provider]  morphine (MS CONTIN) 15 MG 12 hr tablet Take 1 tablet (15 mg total) by mouth every 12 (twelve) hours. Patient not taking: Reported on 12/10/2017 10/02/15   Debbe Odea, MD    Family History Family History  Problem Relation Age of Onset  . Heart disease Father     Social  History Social History   Tobacco Use  . Smoking status: Current Every Day Smoker    Types: E-cigarettes  . Smokeless tobacco: Former Systems developer    Types: Snuff  . Tobacco comment: does not use any tobacco products any more  Substance Use Topics  . Alcohol use: No  . Drug use: No     Allergies   Morphine   Review of Systems Review of Systems  Unable to perform ROS: Mental status change  Constitutional: Positive for fatigue. Negative for chills and fever.  Respiratory: Negative for cough and shortness of breath.   Cardiovascular: Negative for chest pain.  Gastrointestinal: Positive for abdominal pain and diarrhea.  Genitourinary: Negative for dysuria.  Musculoskeletal: Positive for back pain (chronic).  Neurological: Negative  for headaches.  Psychiatric/Behavioral: Positive for confusion.  All other systems reviewed and are negative.    Physical Exam Updated Vital Signs BP 110/72   Pulse (!) 108   Resp (!) 21   Wt 85.7 kg (189 lb)   SpO2 94%   BMI 31.45 kg/m   Physical Exam  Constitutional: He appears well-developed and well-nourished. He appears lethargic.  HENT:  Head: Normocephalic and atraumatic.  Mouth/Throat: Oropharynx is clear and moist.  Eyes: Conjunctivae are normal.  Pinpoint pupils, minimally reactive  Neck: Neck supple.  Cardiovascular: Normal rate, regular rhythm and normal heart sounds.  No murmur heard. Pulmonary/Chest: Effort normal. No respiratory distress. He has decreased breath sounds in the right lower field and the left lower field. He has no wheezes.  Intermittent sonorous breath sounds  Abdominal: Soft. He exhibits no distension. There is tenderness (diffuse, worst in RLQ). There is guarding (RLQ).  Musculoskeletal: He exhibits no edema.  Neurological: He appears lethargic. GCS eye subscore is 3. GCS verbal subscore is 4. GCS motor subscore is 6.  Skin: Skin is warm and dry.  Psychiatric: He has a normal mood and affect.  Nursing note and  vitals reviewed.    ED Treatments / Results  Labs (all labs ordered are listed, but only abnormal results are displayed) Labs Reviewed  COMPREHENSIVE METABOLIC PANEL - Abnormal; Notable for the following components:      Result Value   Sodium 130 (*)    Chloride 93 (*)    Glucose, Bld 103 (*)    BUN 34 (*)    Creatinine, Ser 4.35 (*)    Calcium 8.3 (*)    Total Protein 6.1 (*)    ALT 16 (*)    GFR calc non Af Amer 13 (*)    GFR calc Af Amer 15 (*)    All other components within normal limits  CBC - Abnormal; Notable for the following components:   RBC 3.49 (*)    Hemoglobin 10.1 (*)    HCT 31.3 (*)    All other components within normal limits  ACETAMINOPHEN LEVEL - Abnormal; Notable for the following components:   Acetaminophen (Tylenol), Serum <10 (*)    All other components within normal limits  RAPID URINE DRUG SCREEN, HOSP PERFORMED - Abnormal; Notable for the following components:   Opiates POSITIVE (*)    Benzodiazepines POSITIVE (*)    All other components within normal limits  URINALYSIS, ROUTINE W REFLEX MICROSCOPIC - Abnormal; Notable for the following components:   APPearance HAZY (*)    All other components within normal limits  BRAIN NATRIURETIC PEPTIDE - Abnormal; Notable for the following components:   B Natriuretic Peptide 138.2 (*)    All other components within normal limits  CBG MONITORING, ED - Abnormal; Notable for the following components:   Glucose-Capillary 101 (*)    All other components within normal limits  CULTURE, BLOOD (ROUTINE X 2)  CULTURE, BLOOD (ROUTINE X 2)  ETHANOL  SALICYLATE LEVEL  BASIC METABOLIC PANEL  CBC  SODIUM, URINE, RANDOM  UREA NITROGEN, URINE  CREATININE, URINE, RANDOM  I-STAT TROPONIN, ED  I-STAT CG4 LACTIC ACID, ED  I-STAT CG4 LACTIC ACID, ED    EKG EKG Interpretation  Date/Time:  Thursday December 10 2017 15:01:51 EDT Ventricular Rate:  85 PR Interval:    QRS Duration: 79 QT Interval:  355 QTC  Calculation: 423 R Axis:   72 Text Interpretation:  Sinus rhythm Low voltage, precordial leads Nonspecific T abnormalities, diffuse  leads nonspecific T wave changes since previous  Confirmed by Wandra Arthurs [phone]) on 12/10/2017 3:15:57 PM Also confirmed by Wandra Arthurs [phone]), editor Lynder Parents [phone])  on 12/10/2017 3:52:46 PM   Radiology Ct Abdomen Pelvis Wo Contrast  Result Date: 12/10/2017 CLINICAL DATA:  Right lower quadrant pain and diarrhea for 2 days. Clinical suspicion for appendicitis. EXAM: CT ABDOMEN AND PELVIS WITHOUT CONTRAST TECHNIQUE: Multidetector CT imaging of the abdomen and pelvis was performed following the standard protocol without IV contrast. COMPARISON:  06/08/2015 FINDINGS: Lower chest: No acute findings. Hepatobiliary: No mass visualized on this unenhanced exam. Gallbladder is unremarkable. Pancreas: No mass or inflammatory process visualized on this unenhanced exam. Spleen:  Within normal limits in size. Adrenals/Urinary tract: No evidence of urolithiasis or hydronephrosis. A subtle area of increased attenuation is seen in the lower pole the right kidney measuring approximately 3 cm. This could represent a hemorrhagic cyst or solid renal neoplasm. Unremarkable unopacified urinary bladder. Stomach/Bowel: No evidence of obstruction, inflammatory process, or abnormal fluid collections. The appendix is not visualized, and there appear to be surgical clips at site of prior appendectomy. No inflammatory process seen in region of the cecum or elsewhere. Vascular/Lymphatic: No pathologically enlarged lymph nodes identified. No evidence of abdominal aortic aneurysm. Aortic atherosclerosis. Reproductive:  No mass or other significant abnormality. Other: Artifact from right hip prosthesis obscures visualization of the inferior pelvis. Musculoskeletal: No suspicious bone lesions identified. Neurostimulator device is seen with leads in the lower thoracic spinal canal. IMPRESSION: No  radiographic evidence of appendicitis or other acute findings. Subtle area of increased attenuation in lower pole of right kidney, which could represent a hemorrhagic cyst or a solid renal neoplasm. Nonemergent abdomen MRI without and with contrast is recommended for further evaluation on an outpatient basis. Electronically Signed   By: Earle Gell M.D.   On: 12/10/2017 19:09   Dg Chest Port 1 View  Result Date: 12/10/2017 CLINICAL DATA:  Altered mental status.  Hypoxia EXAM: PORTABLE CHEST 1 VIEW COMPARISON:  06/25/2017 FINDINGS: Hypoventilation with bibasilar atelectasis. Negative for heart failure or pneumonia. No effusion. Spinal cord stimulator in the thoracic spine unchanged. Left shoulder replacement. IMPRESSION: Hypoventilation with bibasilar atelectasis. Electronically Signed   By: Franchot Gallo M.D.   On: 12/10/2017 15:30    Procedures Procedures (including critical care time)  Medications Ordered in ED Medications  naloxone (NARCAN) 0.4 MG/ML injection (has no administration in time range)  citalopram (CELEXA) tablet 20 mg (has no administration in time range)  gabapentin (NEURONTIN) capsule 200 mg (has no administration in time range)  pantoprazole (PROTONIX) EC tablet 40 mg (has no administration in time range)  heparin injection 5,000 Units (has no administration in time range)  sodium chloride flush (NS) 0.9 % injection 3 mL (has no administration in time range)  0.9 %  sodium chloride infusion (1,000 mLs Intravenous New Bag/Given 12/10/17 2003)  acetaminophen (TYLENOL) tablet 650 mg (has no administration in time range)    Or  acetaminophen (TYLENOL) suppository 650 mg (has no administration in time range)  senna-docusate (Senokot-S) tablet 1 tablet (has no administration in time range)  bisacodyl (DULCOLAX) EC tablet 5 mg (has no administration in time range)  ondansetron (ZOFRAN) tablet 4 mg (has no administration in time range)    Or  ondansetron (ZOFRAN) injection 4 mg  (has no administration in time range)  naloxone (NARCAN) injection 0.2-0.4 mg (has no administration in time range)  sodium chloride 0.9 % bolus 1,000 mL (0 mLs  Intravenous Stopped 12/10/17 1700)  naloxone [facility]) injection 0.4 mg (0.4 mg Intravenous Given 12/10/17 1530)  naloxone [facility]) injection 0.2 mg (0.2 mg Intravenous Given 12/10/17 2006)     Initial Impression / Assessment and Plan / ED Course  I have reviewed the triage vital signs and the nursing notes.  Pertinent labs & imaging results that were available during my care of the patient were reviewed by me and considered in my medical decision making (see chart for details).     Patient is a 72 year old male with history of anxiety, chronic pain, history of toxic encephalopathy from his opioid use, aspiration pneumonia who presents with 2 days of lethargy and altered mental status.  Has a long-standing history of polypharmacy and overusing his opioid pain medicines.  On initial evaluation patient is lethargic and somnolent and is unable to answer any questions because he cannot stay awake.  Initial GCS was 13.  He has pinpoint pupils bilaterally and has intermittent sonorous breathing.  He was reportedly having O2 sat of 70% at home on room air was placed on 3 L of oxygen.  He is currently satting 95%.  0.4 mg of Narcan given and patient quickly woke up and is fully alert.  Pupils are no longer pinpoint.  He is now satting 100% on room air.  Lung sounds are clear.  He does have focal right lower quadrant tenderness on exam.  He denies any diarrhea however wife gave a report to EMS that he has been having 2 days of diarrheal illness.  Patient's only complaint is he started having acute on chronic back pain on the left today.  He denies any trauma.  Plan for broad infectious and toxic screen.  However, most likely the patient's altered mental status is purely because of toxic encephalopathy from his opioid use.  No need for head CT as this  time as he has a nonfocal neurological exam.  Plan for a CT abdomen and pelvis due to focal right lower quadrant abdominal pain and he still has his appendix.  Plan for 2-hour observation after Narcan is given to see if he needs any further Narcan to decide on his disposition.  His initial EKG did show mild T wave inversions in inferior, anterior lateral leads.  This could be demand ischemia from having low blood pressure and hypoxia from opioid overdose.  Plan to repeat EKG and also check a troponin.  Troponin was completely normal.  CT scan did not show any acute abnormalities.  Another dose of Narcan given as patient is slowly becoming more somnolent but still awake and talking.  Patient will be admitted to the hospitalist for treatment of AK I as well as monitoring of his mental status.    Final Clinical Impressions(s) / ED Diagnoses   Final diagnoses:  AKI (acute kidney injury) ([place])  Opiate abuse, continuous [facility])    ED Discharge Orders    None       Tobie Poet, DO 12/10/17 2351    Drenda Freeze, MD 12/11/17 2348

## 2017-12-10 NOTE — ED Notes (Signed)
Attempted to obtain urine specimen; Pt unable to provide one at this time; pt's wife states that pt had needed to be cathed on previous occasions

## 2017-12-11 DIAGNOSIS — N179 Acute kidney failure, unspecified: Principal | ICD-10-CM

## 2017-12-11 LAB — CREATININE, URINE, RANDOM: Creatinine, Urine: 156.5 mg/dL

## 2017-12-11 LAB — CBC
HCT: 29.4 % — ABNORMAL LOW (ref 39.0–52.0)
HEMOGLOBIN: 9.3 g/dL — AB (ref 13.0–17.0)
MCH: 28.5 pg (ref 26.0–34.0)
MCHC: 31.6 g/dL (ref 30.0–36.0)
MCV: 90.2 fL (ref 78.0–100.0)
Platelets: 186 10*3/uL (ref 150–400)
RBC: 3.26 MIL/uL — AB (ref 4.22–5.81)
RDW: 13.9 % (ref 11.5–15.5)
WBC: 6.6 10*3/uL (ref 4.0–10.5)

## 2017-12-11 LAB — BASIC METABOLIC PANEL
ANION GAP: 7 (ref 5–15)
BUN: 30 mg/dL — ABNORMAL HIGH (ref 6–20)
CHLORIDE: 100 mmol/L — AB (ref 101–111)
CO2: 23 mmol/L (ref 22–32)
Calcium: 8.1 mg/dL — ABNORMAL LOW (ref 8.9–10.3)
Creatinine, Ser: 2.12 mg/dL — ABNORMAL HIGH (ref 0.61–1.24)
GFR calc non Af Amer: 30 mL/min — ABNORMAL LOW (ref >60)
GFR, EST AFRICAN AMERICAN: 35 mL/min — AB (ref >60)
GLUCOSE: 110 mg/dL — AB (ref 65–99)
Potassium: 4.3 mmol/L (ref 3.5–5.1)
Sodium: 130 mmol/L — ABNORMAL LOW (ref 135–145)

## 2017-12-11 LAB — SODIUM, URINE, RANDOM: Sodium, Ur: 29 mmol/L

## 2017-12-11 NOTE — ED Notes (Signed)
Pt removing BP Cuff and other monitoring.

## 2017-12-11 NOTE — Progress Notes (Signed)
Patient Demographics:    William Jordan, is a 72 y.o. male, DOB - September 05, 1946, IRC:[id number]  Admit date - 12/10/2017   Admitting Physician William Bulls, MD  Outpatient Primary MD for the patient is William Nakai, MD  LOS - 1   Chief Complaint  Patient presents with  . Altered Mental Status        Subjective:    William Jordan today has no fevers, no emesis,  No chest pain,  Still sleepy,    Assessment  & Plan :    Principal Problem:   AKI (acute kidney injury) ([facility]) Active Problems:   Chronic pain associated with significant psychosocial dysfunction   Acute encephalopathy   History of hypertension   Anemia   Acute respiratory failure with hypoxia ([facility])   Opiate overdose ([facility])   Acute kidney injury ([facility])   Kidney lesion, native, right  CT the abdomen pelvis is negative for acute intra-abdominal or pelvic abnormalities, but notable for subtle increased attenuation at the lower pole of the right kidney, possibly reflecting a hemorrhagic cyst or neoplasia    Brief Summary  72 y.o. male with medical history significant for chronic pain, anxiety, and hypertension admitted 12/11/17 with lethargy secondary to opiate and benzo overdose   Plan 1) Metabolic Encephalopathy-related to opiate and benzo overdose, UDS positive for benzodiazepines and opiates, and urinalysis unremarkable.  Acetaminophen, salicylate, and ethanol levels are undetectable.  Patient became somnolent again in the emergency department and was treated with a repeat dose of Narcan, again with improvement.   2)AKI----acute kidney injury    creatinine on admission= 4.35,   baseline creatinine =  0.8  , creatinine is now= 2.1     ,  Avoid nephrotoxic agents/dehydration/hypotension, continue to hold benazepril  3) chronic pain-patient appears to have probably used more opiates and benzos than prescribed, he responded well to Narcan in the ED, UDS  positive for benzos and opiates  4)Possible Rt Renal Neoplasia-patient indicates he does not want further workup we will discuss further when he is more awake  Code Status : full  Disposition Plan  : home   DVT Prophylaxis  :    Heparin    Lab Results  Component Value Date   PLT 186 12/11/2017    Inpatient Medications  Scheduled Meds: . citalopram  20 mg Oral Daily  . gabapentin  200 mg Oral Daily  . heparin  5,000 Units Subcutaneous Q8H  . pantoprazole  40 mg Oral Daily  . sodium chloride flush  3 mL Intravenous Q12H   Continuous Infusions: PRN Meds:.acetaminophen **OR** acetaminophen, bisacodyl, naLOXone (NARCAN)  injection, ondansetron **OR** ondansetron (ZOFRAN) IV, senna-docusate    Anti-infectives (From admission, onward)   None        Objective:   Vitals:   12/11/17 1715 12/11/17 1730 12/11/17 1800 12/11/17 1815  BP: (!) 159/88 (!) 166/93 (!) 156/86   Pulse: 80 81 81 85  Resp: 11 12    Temp:      TempSrc:      SpO2: 100% 100% 100% 100%  Weight:        Wt Readings from Last 3 Encounters:  12/10/17 85.7 kg (189 lb)  10/01/15 85.8 kg (189 lb 1.6 oz)  08/16/15 84.6  kg (186 lb 8 oz)     Intake/Output Summary (Last 24 hours) at 12/11/2017 1858 Last data filed at 12/11/2017 1802 Gross per 24 hour  Intake 2718.17 ml  Output 1500 ml  Net 1218.17 ml     Physical Exam  Gen:- Awake Alert, somewhat sleepy HEENT:- [place].AT, No sclera icterus Neck-Supple Neck,No JVD,.  Lungs-  CTAB , fair air movement CV- S1, S2 normal Abd-  +ve B.Sounds, Abd Soft, No tenderness,    Extremity/Skin:- No  edema,   good pulses Psych-affect is appropriate, oriented x3 Neuro-no new focal deficits, no tremors   Data Review:   Micro Results Recent Results (from the past 240 hour(s))  Blood culture (routine x 2)     Status: None (Preliminary result)   Collection Time: 12/10/17  4:00 PM  Result Value Ref Range Status   Specimen Description BLOOD LEFT ANTECUBITAL  Final    Special Requests   Final    BOTTLES DRAWN AEROBIC AND ANAEROBIC Blood Culture results may not be optimal due to an excessive volume of blood received in culture bottles   Culture   Final    NO GROWTH < 24 HOURS Performed at [facility] [address] [place], [place] [address]    Report Status PENDING  Incomplete  Blood culture (routine x 2)     Status: None (Preliminary result)   Collection Time: 12/10/17  4:15 PM  Result Value Ref Range Status   Specimen Description BLOOD RIGHT HAND  Final   Special Requests   Final    BOTTLES DRAWN AEROBIC AND ANAEROBIC Blood Culture results may not be optimal due to an excessive volume of blood received in culture bottles   Culture   Final    NO GROWTH < 24 HOURS Performed at [facility], [place] [address], [place], [place] [address]    Report Status PENDING  Incomplete    Radiology Reports Ct Abdomen Pelvis Wo Contrast  Result Date: 12/10/2017 CLINICAL DATA:  Right lower quadrant pain and diarrhea for 2 days. Clinical suspicion for appendicitis. EXAM: CT ABDOMEN AND PELVIS WITHOUT CONTRAST TECHNIQUE: Multidetector CT imaging of the abdomen and pelvis was performed following the standard protocol without IV contrast. COMPARISON:  06/08/2015 FINDINGS: Lower chest: No acute findings. Hepatobiliary: No mass visualized on this unenhanced exam. Gallbladder is unremarkable. Pancreas: No mass or inflammatory process visualized on this unenhanced exam. Spleen:  Within normal limits in size. Adrenals/Urinary tract: No evidence of urolithiasis or hydronephrosis. A subtle area of increased attenuation is seen in the lower pole the right kidney measuring approximately 3 cm. This could represent a hemorrhagic cyst or solid renal neoplasm. Unremarkable unopacified urinary bladder. Stomach/Bowel: No evidence of obstruction, inflammatory process, or abnormal fluid collections. The appendix is not visualized, and there appear to be surgical clips at site of  prior appendectomy. No inflammatory process seen in region of the cecum or elsewhere. Vascular/Lymphatic: No pathologically enlarged lymph nodes identified. No evidence of abdominal aortic aneurysm. Aortic atherosclerosis. Reproductive:  No mass or other significant abnormality. Other: Artifact from right hip prosthesis obscures visualization of the inferior pelvis. Musculoskeletal: No suspicious bone lesions identified. Neurostimulator device is seen with leads in the lower thoracic spinal canal. IMPRESSION: No radiographic evidence of appendicitis or other acute findings. Subtle area of increased attenuation in lower pole of right kidney, which could represent a hemorrhagic cyst or a solid renal neoplasm. Nonemergent abdomen MRI without and with contrast is recommended for further evaluation on an  outpatient basis. Electronically Signed   By: Earle Gell M.D.   On: 12/10/2017 19:09   Dg Chest Port 1 View  Result Date: 12/10/2017 CLINICAL DATA:  Altered mental status.  Hypoxia EXAM: PORTABLE CHEST 1 VIEW COMPARISON:  06/25/2017 FINDINGS: Hypoventilation with bibasilar atelectasis. Negative for heart failure or pneumonia. No effusion. Spinal cord stimulator in the thoracic spine unchanged. Left shoulder replacement. IMPRESSION: Hypoventilation with bibasilar atelectasis. Electronically Signed   By: Franchot Gallo M.D.   On: 12/10/2017 15:30     CBC Recent Labs  Lab 12/10/17 1507 12/11/17 0353  WBC 9.2 6.6  HGB 10.1* 9.3*  HCT 31.3* 29.4*  PLT 197 186  MCV 89.7 90.2  MCH 28.9 28.5  MCHC 32.3 31.6  RDW 14.0 13.9    Chemistries  Recent Labs  Lab 12/10/17 1507 12/11/17 0353  NA 130* 130*  K 4.9 4.3  CL 93* 100*  CO2 24 23  GLUCOSE 103* 110*  BUN 34* 30*  CREATININE 4.35* 2.12*  CALCIUM 8.3* 8.1*  AST 38  --   ALT 16*  --   ALKPHOS 83  --   BILITOT 0.9  --    ------------------------------------------------------------------------------------------------------------------ No  results for input(s): CHOL, HDL, LDLCALC, TRIG, CHOLHDL, LDLDIRECT in the last 72 hours.  Lab Results  Component Value Date   HGBA1C 6.1 (H) 08/15/2015   ------------------------------------------------------------------------------------------------------------------ No results for input(s): TSH, T4TOTAL, T3FREE, THYROIDAB in the last 72 hours.  Invalid input(s): FREET3 ------------------------------------------------------------------------------------------------------------------ No results for input(s): VITAMINB12, FOLATE, FERRITIN, TIBC, IRON, RETICCTPCT in the last 72 hours.  Coagulation profile No results for input(s): INR, PROTIME in the last 168 hours.  No results for input(s): DDIMER in the last 72 hours.  Cardiac Enzymes No results for input(s): CKMB, TROPONINI, MYOGLOBIN in the last 168 hours.  Invalid input(s): CK ------------------------------------------------------------------------------------------------------------------    Component Value Date/Time   BNP 138.2 (H) 12/10/2017 1607     Roxan Hockey M.D on 12/11/2017 at 6:58 PM  Between 7am to 7pm - Pager - [phone]  After 7pm go to [email or web address] - password [email or web address]  [facility] -  Office  [phone]   Voice Recognition Viviann Spare dictation system was used to create this note, attempts have been made to correct errors. Please contact the author with questions and/or clarifications.

## 2017-12-12 ENCOUNTER — Other Ambulatory Visit: Payer: Self-pay

## 2017-12-12 ENCOUNTER — Encounter (HOSPITAL_COMMUNITY): Payer: Self-pay | Admitting: *Deleted

## 2017-12-12 LAB — BASIC METABOLIC PANEL
Anion gap: 11 (ref 5–15)
BUN: 14 mg/dL (ref 6–20)
CALCIUM: 8.7 mg/dL — AB (ref 8.9–10.3)
CO2: 23 mmol/L (ref 22–32)
CREATININE: 1 mg/dL (ref 0.61–1.24)
Chloride: 104 mmol/L (ref 101–111)
GFR calc non Af Amer: >60 mL/min (ref >60)
GLUCOSE: 78 mg/dL (ref 65–99)
Potassium: 4.5 mmol/L (ref 3.5–5.1)
Sodium: 138 mmol/L (ref 135–145)

## 2017-12-12 LAB — CBC
HEMATOCRIT: 30.8 % — AB (ref 39.0–52.0)
Hemoglobin: 9.8 g/dL — ABNORMAL LOW (ref 13.0–17.0)
MCH: 28.5 pg (ref 26.0–34.0)
MCHC: 31.8 g/dL (ref 30.0–36.0)
MCV: 89.5 fL (ref 78.0–100.0)
Platelets: 181 10*3/uL (ref 150–400)
RBC: 3.44 MIL/uL — ABNORMAL LOW (ref 4.22–5.81)
RDW: 13.8 % (ref 11.5–15.5)
WBC: 5.5 10*3/uL (ref 4.0–10.5)

## 2017-12-12 LAB — UREA NITROGEN, URINE: Urea Nitrogen, Ur: 372 mg/dL

## 2017-12-12 LAB — GLUCOSE, CAPILLARY: Glucose-Capillary: 70 mg/dL (ref 65–99)

## 2017-12-12 MED ORDER — AMLODIPINE BESYLATE 10 MG PO TABS: 10.0000 mg | ORAL_TABLET | Freq: Every day | ORAL | 11 refills | Status: DC

## 2017-12-12 MED ORDER — OXYCODONE HCL 20 MG PO TABS: 20.0000 mg | ORAL_TABLET | Freq: Three times a day (TID) | ORAL | 0 refills | Status: DC | PRN

## 2017-12-12 MED ORDER — HYDRALAZINE HCL 20 MG/ML IJ SOLN: 10.0000 mg | Freq: Four times a day (QID) | INTRAMUSCULAR | Status: DC | PRN

## 2017-12-12 NOTE — Discharge Instructions (Signed)
1) talk to your pain management doctor about your pain medications as it appears that you are taking too much pain medication 2) taking too many pain medications can make you stop breathing and can kill you 3) please do not take more pain medications than prescribed for you by your doctor 4)Avoid ibuprofen/Advil/Aleve/Motrin/Goody Powders/Naproxen/BC powders/Meloxicam/Diclofenac/Indomethacin and other Nonsteroidal anti-inflammatory medications as these will make you more likely to bleed and can cause stomach ulcers, can also cause Kidney problems.  5) take amlodipine for blood pressure stop benazepril due to concerns for your kidneys

## 2017-12-12 NOTE — Progress Notes (Signed)
Patient discharged home per MD. Discharge instructions reviewed. Reinforced MD teaching to have spouse manage medications. Patient agreed, and discussed this plan with spouse prior to leaving. No prescriptions except amlodipine called in to pharmacy by MD. Patient escorted out per NT. Bartholomew Crews, RN

## 2017-12-12 NOTE — Discharge Summary (Signed)
William Jordan, is a 72 y.o. male  DOB 19-Mar-1946  MRN [id number].  Admission date:  12/10/2017  Admitting Physician  [name], MD  Discharge Date:  12/12/2017   Primary MD  [name], MD  Recommendations for primary care physician for things to follow:   1) talk to your pain management doctor about your pain medications as it appears that you are taking too much pain medication 2) taking too many pain medications can make you stop breathing and can kill you 3) please do not take more pain medications than prescribed for you by your doctor 4)Avoid ibuprofen/Advil/Aleve/Motrin/Goody Powders/Naproxen/BC powders/Meloxicam/Diclofenac/Indomethacin and other Nonsteroidal anti-inflammatory medications as these will make you more likely to bleed and can cause stomach ulcers, can also cause Kidney problems.  5) take amlodipine for blood pressure stop benazepril due to concerns for your kidneys   Admission Diagnosis  AKI (acute kidney injury) ([place]) [N17.9] Opiate abuse, continuous ([place]) [F11.10] Acute kidney injury ([place]) [N17.9]   Discharge Diagnosis  AKI (acute kidney injury) ([place]) [N17.9] Opiate abuse, continuous ([place]) [F11.10] Acute kidney injury ([place]) [N17.9]   Principal Problem:   AKI (acute kidney injury) ([place]) Active Problems:   Chronic pain associated with significant psychosocial dysfunction   Acute encephalopathy   History of hypertension   Anemia   Acute respiratory failure with hypoxia ([place])   Opiate overdose ([place])   Acute kidney injury ([place])   Kidney lesion, native, right      Past Medical History:  Diagnosis Date  . Anxiety    takes Celexa  . Arthritis   . Chronic back pain   . Enlarged prostate   . History of blood transfusion   . Hypertension   . Neuropathy     Past Surgical History:  Procedure Laterality Date  . COLONOSCOPY     one polyp removed - benign  . ELBOW  SURGERY Right   . HERNIA REPAIR     umbilical  . JOINT REPLACEMENT Left    knee, x 2  . JOINT REPLACEMENT Right    hip  . JOINT REPLACEMENT Left    partial shoulder  . SPINAL CORD STIMULATOR INSERTION N/A 09/28/2014   Procedure: LUMBAR SPINAL CORD STIMULATOR INSERTION;  Surgeon: Melina Schools, MD;  Location: [facility];  Service: Orthopedics;  Laterality: N/A;  . TONSILLECTOMY    . VASECTOMY         HPI  from the history and physical done on the day of admission:    Chief Complaint: Lethargy   HPI: William Jordan is a 72 y.o. male with medical history significant for chronic pain, anxiety, and hypertension, now presenting to the emergency department with 2 days of lethargy.  Patient is accompanied by family who assist with the history.  He had reportedly been in his usual state of health until becoming somnolent 2 days ago.  He has been experiencing some intermittent right lower quadrant pain, but denies vomiting or diarrhea despite an earlier note suggesting this; his family has also not  seen any vomiting or diarrhea.  Patient has been sleeping for the majority of the past 48 hours, has woken briefly a few times, ate some, took his oxycodone, and go back to sleep.  Family has been able to wake him, but he will not stay awake for long.  Patient denies any chest pain, palpitations, cough, shortness of breath, fevers, or chills.  He denies any abdominal pain at this time, but reports intermittent pains in the right lower quadrant.  EMS was called by the patient's family and they report finding the patient to be saturating 70% on room air with pinpoint pupils.  ED Course: Upon arrival to the ED, patient is found to be slightly tachycardic, mildly tachypneic, blood pressure 94/40, and saturating low 90s on 2 L/min of supplemental oxygen.  Patient was lethargic with pinpoint pupils, and had a dramatic recovery with 0.4 mg of Narcan.  EKG features a sinus rhythm with low voltage QRS and nonspecific T wave  abnormalities.  Chest x-ray is notable for hypoventilation and bibasilar atelectasis.  CT the abdomen pelvis is negative for acute intra-abdominal or pelvic abnormalities, but notable for subtle increased attenuation at the lower pole of the right kidney, possibly reflecting a hemorrhagic cyst or neoplasia.  Chemistry panel features sodium 130 and creatinine of 4.35, up from 0.8 last October.  CBC features a chronic stable normocytic anemia with hemoglobin of 10.1.  Lactic acid is reassuring, troponin is undetectable, BNP slightly elevated, UDS positive for benzodiazepines and opiates, and urinalysis unremarkable.  Acetaminophen, salicylate, and ethanol levels are undetectable.  Patient became somnolent again in the emergency department and was treated with a repeat dose of Narcan, again with improvement.  He will be admitted to the stepdown unit for ongoing evaluation and management of suspected opiate overdose and acute renal failure.       Hospital Course:     Imaging:- CT the abdomen pelvis is negative for acute intra-abdominal or pelvic abnormalities, but notable for subtle increased attenuation at the lower pole of the right kidney, possibly reflecting a hemorrhagic cyst or neoplasia    Brief Summary  72 y.o. male with medical history significant for chronic pain, anxiety, and hypertension admitted 12/11/17 with lethargy secondary to opiate and benzo overdose   Plan 1) Metabolic Encephalopathy-patient is back to baseline, ambulating, no new concerns, metabolic encephalopathy as resolved, it was related to opiate and benzo overdose, UDS positive for benzodiazepines and opiates, and urinalysis unremarkable.  Acetaminophen, salicylate, and ethanol levels are undetectable.  Patient became somnolent again in the emergency department and was treated with a repeat dose of Narcan, again with improvement.  Remains awake and ambulating around  2)AKI----acute kidney injury    creatinine on admission=  4.35,   baseline creatinine =  0.8  , creatinine is now= 1.0     ,  Avoid nephrotoxic agents/dehydration/hypotension, stop benazepril  3) chronic pain-patient appears to have probably used more opiates and benzos than prescribed, he responded well to Narcan in the ED, UDS positive for benzos and opiates, patient declines further counseling, he just wants to go home he will follow-up with his pain clinic doctor .  Patient adamant he just wants to leave the hospital he does no want any further intervention from psychiatric team, says he can manage his pain medications and benzos.  Patient states that he wants to be left alone  4)Possible Rt Renal Neoplasia-    after extensive conversation on 12/12/2017 patient continues to indicate that he does not  want further workup   5)HTN-patient uses a lot of NSAIDs, he has had previous episodes of AKI in the setting of dehydration and benazepril use, okay to stop benazepril and use amlodipine for blood pressure control  Code Status : full  Disposition Plan  : home  Discharge Condition: stable, post ambulation oxygen saturation is 98% on room air  Follow UP-pain specialist  Diet and Activity recommendation:  As advised  Discharge Instructions    Discharge Instructions    Diet - low sodium heart healthy   Complete by:  As directed    Discharge instructions   Complete by:  As directed    1) talk to your pain management doctor about your pain medications as it appears that you are taking too much pain medication 2) taking too many pain medications can make you stop breathing and can kill you 3) please do not take more pain medications than prescribed for you by your doctor 4)Avoid ibuprofen/Advil/Aleve/Motrin/Goody Powders/Naproxen/BC powders/Meloxicam/Diclofenac/Indomethacin and other Nonsteroidal anti-inflammatory medications as these will make you more likely to bleed and can cause stomach ulcers, can also cause Kidney problems.  5) take amlodipine for  blood pressure stop benazepril due to concerns for your kidneys   Increase activity slowly   Complete by:  As directed         Discharge Medications     Allergies as of 12/12/2017      Reactions   Morphine Other (See Comments)   Causes confusion and "makes me crazy," per the patient      Medication List    STOP taking these medications   benazepril 20 MG tablet Commonly known as:  LOTENSIN   ibuprofen 800 MG tablet Commonly known as:  ADVIL,MOTRIN     TAKE these medications   amLODipine 10 MG tablet Commonly known as:  NORVASC Take 1 tablet (10 mg total) by mouth daily.   celecoxib 100 MG capsule Commonly known as:  CELEBREX Take 100 mg by mouth 2 (two) times daily as needed (FOR INFLAMMATION).   citalopram 20 MG tablet Commonly known as:  CELEXA Take 20 mg by mouth daily.   colchicine 0.6 MG tablet Take 0.6 mg by mouth 3 (three) times daily as needed (as directed FOR GOUT FLARES).   docusate sodium 100 MG capsule Commonly known as:  COLACE Take 2 capsules (200 mg total) by mouth at bedtime. What changed:  how much to take   gabapentin 300 MG capsule Commonly known as:  NEURONTIN Take 300 mg by mouth 3 (three) times daily.   morphine 15 MG 12 hr tablet Commonly known as:  MS CONTIN Take 1 tablet (15 mg total) by mouth every 12 (twelve) hours.   omeprazole 20 MG capsule Commonly known as:  PRILOSEC Take 20 mg by mouth daily as needed for indigestion.   Oxycodone HCl 20 MG Tabs Take 1 tablet (20 mg total) by mouth every 8 (eight) hours as needed. What changed:  See the new instructions.   polyethylene glycol packet Commonly known as:  MIRALAX / GLYCOLAX Take 17 g by mouth daily. What changed:    when to take this  reasons to take this   SAW PALMETTO PO Take 1 capsule by mouth daily.   vitamin B-12 100 MCG tablet Commonly known as:  CYANOCOBALAMIN Take 1 tablet (100 mcg total) by mouth daily. What changed:  when to take this       Major  procedures and Radiology Reports - PLEASE review detailed and final  reports for all details, in brief -   Ct Abdomen Pelvis Wo Contrast  Result Date: 12/10/2017 CLINICAL DATA:  Right lower quadrant pain and diarrhea for 2 days. Clinical suspicion for appendicitis. EXAM: CT ABDOMEN AND PELVIS WITHOUT CONTRAST TECHNIQUE: Multidetector CT imaging of the abdomen and pelvis was performed following the standard protocol without IV contrast. COMPARISON:  06/08/2015 FINDINGS: Lower chest: No acute findings. Hepatobiliary: No mass visualized on this unenhanced exam. Gallbladder is unremarkable. Pancreas: No mass or inflammatory process visualized on this unenhanced exam. Spleen:  Within normal limits in size. Adrenals/Urinary tract: No evidence of urolithiasis or hydronephrosis. A subtle area of increased attenuation is seen in the lower pole the right kidney measuring approximately 3 cm. This could represent a hemorrhagic cyst or solid renal neoplasm. Unremarkable unopacified urinary bladder. Stomach/Bowel: No evidence of obstruction, inflammatory process, or abnormal fluid collections. The appendix is not visualized, and there appear to be surgical clips at site of prior appendectomy. No inflammatory process seen in region of the cecum or elsewhere. Vascular/Lymphatic: No pathologically enlarged lymph nodes identified. No evidence of abdominal aortic aneurysm. Aortic atherosclerosis. Reproductive:  No mass or other significant abnormality. Other: Artifact from right hip prosthesis obscures visualization of the inferior pelvis. Musculoskeletal: No suspicious bone lesions identified. Neurostimulator device is seen with leads in the lower thoracic spinal canal. IMPRESSION: No radiographic evidence of appendicitis or other acute findings. Subtle area of increased attenuation in lower pole of right kidney, which could represent a hemorrhagic cyst or a solid renal neoplasm. Nonemergent abdomen MRI without and with contrast  is recommended for further evaluation on an outpatient basis. Electronically Signed   By: Earle Gell M.D.   On: 12/10/2017 19:09   Dg Chest Port 1 View  Result Date: 12/10/2017 CLINICAL DATA:  Altered mental status.  Hypoxia EXAM: PORTABLE CHEST 1 VIEW COMPARISON:  06/25/2017 FINDINGS: Hypoventilation with bibasilar atelectasis. Negative for heart failure or pneumonia. No effusion. Spinal cord stimulator in the thoracic spine unchanged. Left shoulder replacement. IMPRESSION: Hypoventilation with bibasilar atelectasis. Electronically Signed   By: Franchot Gallo M.D.   On: 12/10/2017 15:30    Micro Results   Recent Results (from the past 240 hour(s))  Blood culture (routine x 2)     Status: None (Preliminary result)   Collection Time: 12/10/17  4:00 PM  Result Value Ref Range Status   Specimen Description BLOOD LEFT ANTECUBITAL  Final   Special Requests   Final    BOTTLES DRAWN AEROBIC AND ANAEROBIC Blood Culture results may not be optimal due to an excessive volume of blood received in culture bottles   Culture   Final    NO GROWTH < 24 HOURS Performed at [facility] [address] [place], [place] [address]    Report Status PENDING  Incomplete  Blood culture (routine x 2)     Status: None (Preliminary result)   Collection Time: 12/10/17  4:15 PM  Result Value Ref Range Status   Specimen Description BLOOD RIGHT HAND  Final   Special Requests   Final    BOTTLES DRAWN AEROBIC AND ANAEROBIC Blood Culture results may not be optimal due to an excessive volume of blood received in culture bottles   Culture   Final    NO GROWTH < 24 HOURS Performed at [facility], [place] [address] [place],  [address]    Report Status PENDING  Incomplete       Today   Subjective  William Jordan today has no new complaints, patient just wants to go home, is ambulating around without hypoxia, complains of neuropathy of both feet and hips and feet pain,           Patient has been  seen and examined prior to discharge   Objective   Blood pressure (!) 175/90, pulse 67, temperature 98.3 F (36.8 C), temperature source Oral, resp. rate 18, height 5\' 10"  (1.778 m), weight 88.3 kg (194 lb 10.7 oz), SpO2 100 %.   Intake/Output Summary (Last 24 hours) at 12/12/2017 0923 Last data filed at 12/12/2017 0600 Gross per 24 hour  Intake 2418.17 ml  Output 400 ml  Net 2018.17 ml    Exam Gen:- Awake Alert,  In no apparent distress  HEENT:- [place].AT, No sclera icterus Neck-Supple Neck,No JVD,.  Lungs-  CTAB , good air movement CV- S1, S2 normal Abd-  +ve B.Sounds, Abd Soft, No tenderness,    Extremity/Skin:- No  edema,   good pulses Psych-affect is appropriate, oriented x3 Neuro-no new focal deficits, no tremors   Data Review   CBC w Diff:  Lab Results  Component Value Date   WBC 5.5 12/12/2017   HGB 9.8 (L) 12/12/2017   HCT 30.8 (L) 12/12/2017   HCT 28.0 (L) 08/15/2015   PLT 181 12/12/2017   LYMPHOPCT 14 09/29/2015   MONOPCT 7 09/29/2015   EOSPCT 0 09/29/2015   BASOPCT 0 09/29/2015    CMP:  Lab Results  Component Value Date   NA 138 12/12/2017   K 4.5 12/12/2017   CL 104 12/12/2017   CO2 23 12/12/2017   BUN 14 12/12/2017   CREATININE 1.00 12/12/2017   PROT 6.1 (L) 12/10/2017   ALBUMIN 3.6 12/10/2017   BILITOT 0.9 12/10/2017   ALKPHOS 83 12/10/2017   AST 38 12/10/2017   ALT 16 (L) 12/10/2017  .   Total Discharge time is about 33 minutes  Roxan Hockey M.D on 12/12/2017 at 9:23 AM  [facility]   Office  [phone]  Voice Recognition Viviann Spare dictation system was used to create this note, attempts have been made to correct errors. Please contact the author with questions and/or clarifications.

## 2017-12-15 LAB — CULTURE, BLOOD (ROUTINE X 2)
Culture: NO GROWTH
Culture: NO GROWTH

## 2018-02-04 DIAGNOSIS — G603 Idiopathic progressive neuropathy: Secondary | ICD-10-CM | POA: Diagnosis not present

## 2018-02-04 DIAGNOSIS — Z79891 Long term (current) use of opiate analgesic: Secondary | ICD-10-CM | POA: Diagnosis not present

## 2018-02-04 DIAGNOSIS — M1991 Primary osteoarthritis, unspecified site: Secondary | ICD-10-CM | POA: Diagnosis not present

## 2018-02-04 DIAGNOSIS — G894 Chronic pain syndrome: Secondary | ICD-10-CM | POA: Diagnosis not present

## 2018-02-10 DIAGNOSIS — Z9889 Other specified postprocedural states: Secondary | ICD-10-CM | POA: Diagnosis not present

## 2018-02-10 DIAGNOSIS — Z8719 Personal history of other diseases of the digestive system: Secondary | ICD-10-CM | POA: Diagnosis not present

## 2018-03-25 DIAGNOSIS — R109 Unspecified abdominal pain: Secondary | ICD-10-CM | POA: Diagnosis not present

## 2018-04-08 DIAGNOSIS — M1991 Primary osteoarthritis, unspecified site: Secondary | ICD-10-CM | POA: Diagnosis not present

## 2018-04-08 DIAGNOSIS — G603 Idiopathic progressive neuropathy: Secondary | ICD-10-CM | POA: Diagnosis not present

## 2018-04-08 DIAGNOSIS — Z79891 Long term (current) use of opiate analgesic: Secondary | ICD-10-CM | POA: Diagnosis not present

## 2018-04-08 DIAGNOSIS — G894 Chronic pain syndrome: Secondary | ICD-10-CM | POA: Diagnosis not present

## 2018-04-14 DIAGNOSIS — G894 Chronic pain syndrome: Secondary | ICD-10-CM | POA: Diagnosis not present

## 2018-04-14 DIAGNOSIS — R600 Localized edema: Secondary | ICD-10-CM | POA: Diagnosis not present

## 2018-04-14 DIAGNOSIS — J9811 Atelectasis: Secondary | ICD-10-CM | POA: Diagnosis not present

## 2018-04-14 DIAGNOSIS — M109 Gout, unspecified: Secondary | ICD-10-CM | POA: Diagnosis not present

## 2018-04-14 DIAGNOSIS — I517 Cardiomegaly: Secondary | ICD-10-CM | POA: Diagnosis not present

## 2018-04-14 DIAGNOSIS — G8929 Other chronic pain: Secondary | ICD-10-CM | POA: Diagnosis not present

## 2018-04-14 DIAGNOSIS — N179 Acute kidney failure, unspecified: Secondary | ICD-10-CM | POA: Diagnosis not present

## 2018-04-14 DIAGNOSIS — N400 Enlarged prostate without lower urinary tract symptoms: Secondary | ICD-10-CM | POA: Diagnosis not present

## 2018-04-14 DIAGNOSIS — F17290 Nicotine dependence, other tobacco product, uncomplicated: Secondary | ICD-10-CM | POA: Diagnosis not present

## 2018-04-14 DIAGNOSIS — G9340 Encephalopathy, unspecified: Secondary | ICD-10-CM | POA: Diagnosis not present

## 2018-04-14 DIAGNOSIS — Z9689 Presence of other specified functional implants: Secondary | ICD-10-CM | POA: Diagnosis not present

## 2018-04-14 DIAGNOSIS — Z79899 Other long term (current) drug therapy: Secondary | ICD-10-CM | POA: Diagnosis not present

## 2018-04-14 DIAGNOSIS — I5030 Unspecified diastolic (congestive) heart failure: Secondary | ICD-10-CM | POA: Diagnosis not present

## 2018-04-14 DIAGNOSIS — E7800 Pure hypercholesterolemia, unspecified: Secondary | ICD-10-CM | POA: Diagnosis not present

## 2018-04-14 DIAGNOSIS — Z96641 Presence of right artificial hip joint: Secondary | ICD-10-CM | POA: Diagnosis not present

## 2018-04-14 DIAGNOSIS — M25559 Pain in unspecified hip: Secondary | ICD-10-CM | POA: Diagnosis not present

## 2018-04-14 DIAGNOSIS — I110 Hypertensive heart disease with heart failure: Secondary | ICD-10-CM | POA: Diagnosis not present

## 2018-04-14 DIAGNOSIS — I509 Heart failure, unspecified: Secondary | ICD-10-CM | POA: Diagnosis not present

## 2018-04-14 DIAGNOSIS — M545 Low back pain: Secondary | ICD-10-CM | POA: Diagnosis not present

## 2018-04-14 DIAGNOSIS — F329 Major depressive disorder, single episode, unspecified: Secondary | ICD-10-CM | POA: Diagnosis not present

## 2018-04-14 DIAGNOSIS — E871 Hypo-osmolality and hyponatremia: Secondary | ICD-10-CM | POA: Diagnosis not present

## 2018-04-14 DIAGNOSIS — I700 Atherosclerosis of aorta: Secondary | ICD-10-CM | POA: Diagnosis not present

## 2018-04-14 DIAGNOSIS — G629 Polyneuropathy, unspecified: Secondary | ICD-10-CM | POA: Diagnosis not present

## 2018-04-15 DIAGNOSIS — I219 Acute myocardial infarction, unspecified: Secondary | ICD-10-CM

## 2018-04-15 DIAGNOSIS — G8929 Other chronic pain: Secondary | ICD-10-CM | POA: Diagnosis not present

## 2018-04-15 DIAGNOSIS — I5030 Unspecified diastolic (congestive) heart failure: Secondary | ICD-10-CM | POA: Diagnosis not present

## 2018-04-15 HISTORY — DX: Acute myocardial infarction, unspecified: I21.9

## 2018-04-18 DIAGNOSIS — G9340 Encephalopathy, unspecified: Secondary | ICD-10-CM | POA: Diagnosis not present

## 2018-04-18 DIAGNOSIS — M109 Gout, unspecified: Secondary | ICD-10-CM | POA: Diagnosis not present

## 2018-04-18 DIAGNOSIS — R1312 Dysphagia, oropharyngeal phase: Secondary | ICD-10-CM | POA: Diagnosis not present

## 2018-04-18 DIAGNOSIS — J0510 Acute epiglottitis without obstruction: Secondary | ICD-10-CM | POA: Diagnosis not present

## 2018-04-18 DIAGNOSIS — Z79891 Long term (current) use of opiate analgesic: Secondary | ICD-10-CM | POA: Diagnosis not present

## 2018-04-18 DIAGNOSIS — T783XXA Angioneurotic edema, initial encounter: Secondary | ICD-10-CM | POA: Diagnosis not present

## 2018-04-18 DIAGNOSIS — R0600 Dyspnea, unspecified: Secondary | ICD-10-CM | POA: Diagnosis not present

## 2018-04-18 DIAGNOSIS — I10 Essential (primary) hypertension: Secondary | ICD-10-CM | POA: Diagnosis not present

## 2018-04-18 DIAGNOSIS — Z885 Allergy status to narcotic agent status: Secondary | ICD-10-CM | POA: Diagnosis not present

## 2018-04-18 DIAGNOSIS — Z79899 Other long term (current) drug therapy: Secondary | ICD-10-CM | POA: Diagnosis not present

## 2018-04-18 DIAGNOSIS — G629 Polyneuropathy, unspecified: Secondary | ICD-10-CM | POA: Diagnosis not present

## 2018-04-18 DIAGNOSIS — E871 Hypo-osmolality and hyponatremia: Secondary | ICD-10-CM | POA: Diagnosis not present

## 2018-04-18 DIAGNOSIS — G8929 Other chronic pain: Secondary | ICD-10-CM | POA: Diagnosis not present

## 2018-04-18 DIAGNOSIS — I110 Hypertensive heart disease with heart failure: Secondary | ICD-10-CM | POA: Diagnosis not present

## 2018-04-18 DIAGNOSIS — F1490 Cocaine use, unspecified, uncomplicated: Secondary | ICD-10-CM | POA: Diagnosis not present

## 2018-04-18 DIAGNOSIS — E785 Hyperlipidemia, unspecified: Secondary | ICD-10-CM | POA: Diagnosis not present

## 2018-04-18 DIAGNOSIS — I5030 Unspecified diastolic (congestive) heart failure: Secondary | ICD-10-CM | POA: Diagnosis not present

## 2018-04-18 DIAGNOSIS — J387 Other diseases of larynx: Secondary | ICD-10-CM | POA: Diagnosis not present

## 2018-04-18 DIAGNOSIS — E7800 Pure hypercholesterolemia, unspecified: Secondary | ICD-10-CM | POA: Diagnosis not present

## 2018-04-18 DIAGNOSIS — I509 Heart failure, unspecified: Secondary | ICD-10-CM | POA: Diagnosis not present

## 2018-04-18 DIAGNOSIS — R1310 Dysphagia, unspecified: Secondary | ICD-10-CM | POA: Diagnosis not present

## 2018-04-18 DIAGNOSIS — F329 Major depressive disorder, single episode, unspecified: Secondary | ICD-10-CM | POA: Diagnosis not present

## 2018-04-18 DIAGNOSIS — Z87891 Personal history of nicotine dependence: Secondary | ICD-10-CM | POA: Diagnosis not present

## 2018-04-19 ENCOUNTER — Other Ambulatory Visit: Payer: Self-pay | Admitting: *Deleted

## 2018-04-19 DIAGNOSIS — T783XXA Angioneurotic edema, initial encounter: Secondary | ICD-10-CM | POA: Diagnosis not present

## 2018-04-19 DIAGNOSIS — R1312 Dysphagia, oropharyngeal phase: Secondary | ICD-10-CM | POA: Diagnosis not present

## 2018-04-19 MED ORDER — TERAZOSIN HCL 1 MG PO CAPS
1.00 | ORAL_CAPSULE | ORAL | Status: DC
Start: 2018-04-19 — End: 2018-04-19

## 2018-04-19 MED ORDER — CETIRIZINE HCL 10 MG PO TABS
10.00 | ORAL_TABLET | ORAL | Status: DC
Start: 2018-04-19 — End: 2018-04-19

## 2018-04-19 MED ORDER — PANTOPRAZOLE SODIUM 20 MG PO TBEC
20.00 | DELAYED_RELEASE_TABLET | ORAL | Status: DC
Start: 2018-04-20 — End: 2018-04-19

## 2018-04-19 MED ORDER — CITALOPRAM HYDROBROMIDE 20 MG PO TABS
20.00 | ORAL_TABLET | ORAL | Status: DC
Start: 2018-04-20 — End: 2018-04-19

## 2018-04-19 MED ORDER — DEXAMETHASONE SODIUM PHOSPHATE 4 MG/ML IJ SOLN
10.00 | INTRAMUSCULAR | Status: DC
Start: 2018-04-19 — End: 2018-04-19

## 2018-04-19 MED ORDER — CLONIDINE HCL 0.1 MG PO TABS
0.20 | ORAL_TABLET | ORAL | Status: DC
Start: 2018-04-20 — End: 2018-04-19

## 2018-04-19 MED ORDER — OXYCODONE HCL 20 MG PO TABS
20.00 | ORAL_TABLET | ORAL | Status: DC
Start: ? — End: 2018-04-19

## 2018-04-19 MED ORDER — ALUM & MAG HYDROXIDE-SIMETH 400-400-40 MG/5ML PO SUSP
30.00 | ORAL | Status: DC
Start: ? — End: 2018-04-19

## 2018-04-19 MED ORDER — GENERIC EXTERNAL MEDICATION
2.00 | Status: DC
Start: ? — End: 2018-04-19

## 2018-04-19 MED ORDER — DIPHENHYDRAMINE HCL 50 MG/ML IJ SOLN
25.00 | INTRAMUSCULAR | Status: DC
Start: 2018-04-19 — End: 2018-04-19

## 2018-04-19 MED ORDER — AMLODIPINE BESYLATE 10 MG PO TABS
10.00 | ORAL_TABLET | ORAL | Status: DC
Start: 2018-04-20 — End: 2018-04-19

## 2018-04-19 MED ORDER — POLYETHYLENE GLYCOL 3350 17 G PO PACK
17.00 | PACK | ORAL | Status: DC
Start: ? — End: 2018-04-19

## 2018-04-19 MED ORDER — FAMOTIDINE 20 MG/2ML IV SOLN
20.00 | INTRAVENOUS | Status: DC
Start: 2018-04-19 — End: 2018-04-19

## 2018-04-19 MED ORDER — GABAPENTIN 400 MG PO CAPS
400.00 | ORAL_CAPSULE | ORAL | Status: DC
Start: 2018-04-19 — End: 2018-04-19

## 2018-04-19 NOTE — Patient Outreach (Signed)
[place] [facility]) Care Management  04/19/2018  ASHER TORPEY 1945-11-22 [id number]   Transition of Care Referral   Referral Date: 04/19/18 Referral Source: HTA urgent outreach for [facility]  Date of Admission:  Diagnosis:  Date of Discharge: 04/15/18 Facility:  [facility] hospital Insurance: Health team advantage  Outreach attempt # 1 No answer. [facility] RN CM left HIPAA compliant voicemail message along with CM's contact info.    Conditions: CHF, HTN, HLD, depression, and chronic pain, angioedema  Plan: [facility] RN CM sent an unsuccessful outreach letter and scheduled this patient for another call attempt within 4 business days  Kimberly L. Lavina Hamman, RN, BSN, CCM [facility] Telephonic Care Management Care Coordinator Direct number [phone]  Main [facility] number [phone] Fax number [phone]

## 2018-04-20 ENCOUNTER — Other Ambulatory Visit: Payer: Self-pay | Admitting: *Deleted

## 2018-04-20 NOTE — Patient Outreach (Signed)
[place] [facility]) Care Management  04/20/2018  William Jordan [birth year] [id number]   Transition of Care Referral  Referral Date: 04/19/18 Referral Source: HTA urgent outreach for [facility]  Date of Admission:  unknown Diagnosis: unknown Date of Discharge: 04/15/18 Facility:  [facility] hospital Insurance: Health team advantage  Outreach attempt # 2 No answer. [facility] RN CM left HIPAA compliant voicemail message along with CM's contact info.    Conditions: CHF, HTN, HLD, depression, and chronic pain, angioedema  Plan: [facility] RN CM scheduled this patient for another call attempt within 4 business days  Kimberly L. Lavina Hamman, RN, BSN, CCM [facility] Telephonic Care Management Care Coordinator Direct number [phone]  Main [facility] number [phone] Fax number [phone]

## 2018-04-21 DIAGNOSIS — T783XXD Angioneurotic edema, subsequent encounter: Secondary | ICD-10-CM | POA: Diagnosis not present

## 2018-04-21 DIAGNOSIS — I10 Essential (primary) hypertension: Secondary | ICD-10-CM | POA: Diagnosis not present

## 2018-04-22 ENCOUNTER — Other Ambulatory Visit: Payer: Self-pay | Admitting: *Deleted

## 2018-04-22 NOTE — Patient Outreach (Signed)
[place]) Care Management  04/22/2018  William Jordan [birth year] [id number]   Transition of Care Referral  Referral Date:04/19/18 Referral Source:HTA urgent outreach for [facility] Date of Admission:  unknown Diagnosis:  William Jordan reports a dx of angioedema from medicine he had been on for "eight  years"  Date of Discharge:04/15/18 Facility:[facility] Insurance:Health team advantage  Outreach attempt #3 successful at his mobile number   Patient is able to verify HIPAA Reviewed and addressed referral to [facility] with patient  Social: William Jordan confirms he lives with his disabled wife.He reports being independent with all his ADLs and iADLs plus transportation   Medications: William Jordan is able to review his medicines but states he felt uncomfortable discussing them all. When Cm inquired about any concerns with cost, he mention previous issues with coverage gap for a medicine his pain management provider was able to help resolve.   Appointments: William Jordan reports having a follow hospital appointment with his primary MD, Dr Vassie Loll William Jordan this am (04/22/18) He confirms he changed from Dr William Jordan to Dr William Jordan  [facility] RN CM updated Epic with Dr William Jordan as primary MD and William Jordan as the pain management MD he confirms   Advance Directives: William Jordan reports having a POA but no living will at this time.  He reports wanting to make changes but not at this time. Cm encouraged him to call back to [place] CM when he is ready for a referral to a [facility] SW for assistance    Consent: [facility] RN CM reviewed [facility] services with patient. He denies need of services from [facility] Community/Telephonic RN CM, pharmacy or SW at this time   Conditions:CHF, HTN, HLD, depression, chronic pain, angioedema, neuropathy  [facility] CM offered to send information to him about CHF when he discussed concerns with it.  CM completed transition of care assessment and inquired about weights. He reports a weight of 184-5  lbs as a baseline and that he is aware of need to contact MD if above 3-5 lbs. He reports not having money and not having a scale [facility] RN CM discuss inquiring about possible assistance for a scale and he voiced appreciation   Plan: [facility] RN CM check for availability of scale  [facility] will update patient prn And/or close case as no further needs identified Encouraged William Jordan to return a call to Cm if other needs arise in future  Aleasha Fregeau L. Lavina Hamman, RN, BSN, [place] Coordinator Office number [phone] Mobile number [phone]  Main [facility] number [phone] Fax number [phone]

## 2018-04-25 ENCOUNTER — Inpatient Hospital Stay (HOSPITAL_COMMUNITY)
Admission: EM | Admit: 2018-04-25 | Discharge: 2018-04-28 | DRG: 040 | Disposition: A | Discharge status: Rehabilitation Facility | Payer: PPO | Attending: Oncology | Admitting: Oncology

## 2018-04-25 ENCOUNTER — Encounter (HOSPITAL_COMMUNITY): Payer: Self-pay | Admitting: Emergency Medicine

## 2018-04-25 ENCOUNTER — Emergency Department (HOSPITAL_COMMUNITY): Payer: PPO

## 2018-04-25 ENCOUNTER — Other Ambulatory Visit: Payer: Self-pay

## 2018-04-25 DIAGNOSIS — R748 Abnormal levels of other serum enzymes: Secondary | ICD-10-CM | POA: Diagnosis not present

## 2018-04-25 DIAGNOSIS — M25512 Pain in left shoulder: Secondary | ICD-10-CM | POA: Diagnosis present

## 2018-04-25 DIAGNOSIS — H538 Other visual disturbances: Secondary | ICD-10-CM | POA: Diagnosis present

## 2018-04-25 DIAGNOSIS — R262 Difficulty in walking, not elsewhere classified: Secondary | ICD-10-CM | POA: Diagnosis not present

## 2018-04-25 DIAGNOSIS — R7989 Other specified abnormal findings of blood chemistry: Secondary | ICD-10-CM

## 2018-04-25 DIAGNOSIS — G8929 Other chronic pain: Secondary | ICD-10-CM | POA: Diagnosis not present

## 2018-04-25 DIAGNOSIS — I63511 Cerebral infarction due to unspecified occlusion or stenosis of right middle cerebral artery: Secondary | ICD-10-CM | POA: Diagnosis not present

## 2018-04-25 DIAGNOSIS — N179 Acute kidney failure, unspecified: Secondary | ICD-10-CM | POA: Diagnosis present

## 2018-04-25 DIAGNOSIS — R0789 Other chest pain: Secondary | ICD-10-CM | POA: Diagnosis not present

## 2018-04-25 DIAGNOSIS — Z882 Allergy status to sulfonamides status: Secondary | ICD-10-CM

## 2018-04-25 DIAGNOSIS — I2129 ST elevation (STEMI) myocardial infarction involving other sites: Secondary | ICD-10-CM | POA: Diagnosis present

## 2018-04-25 DIAGNOSIS — I237 Postinfarction angina: Secondary | ICD-10-CM | POA: Diagnosis not present

## 2018-04-25 DIAGNOSIS — Z79899 Other long term (current) drug therapy: Secondary | ICD-10-CM

## 2018-04-25 DIAGNOSIS — I672 Cerebral atherosclerosis: Secondary | ICD-10-CM | POA: Diagnosis not present

## 2018-04-25 DIAGNOSIS — K219 Gastro-esophageal reflux disease without esophagitis: Secondary | ICD-10-CM | POA: Diagnosis not present

## 2018-04-25 DIAGNOSIS — R778 Other specified abnormalities of plasma proteins: Secondary | ICD-10-CM

## 2018-04-25 DIAGNOSIS — I472 Ventricular tachycardia, unspecified: Secondary | ICD-10-CM

## 2018-04-25 DIAGNOSIS — G8194 Hemiplegia, unspecified affecting left nondominant side: Secondary | ICD-10-CM | POA: Diagnosis present

## 2018-04-25 DIAGNOSIS — E871 Hypo-osmolality and hyponatremia: Secondary | ICD-10-CM | POA: Diagnosis not present

## 2018-04-25 DIAGNOSIS — I208 Other forms of angina pectoris: Secondary | ICD-10-CM | POA: Diagnosis not present

## 2018-04-25 DIAGNOSIS — I6789 Other cerebrovascular disease: Secondary | ICD-10-CM | POA: Diagnosis not present

## 2018-04-25 DIAGNOSIS — I2510 Atherosclerotic heart disease of native coronary artery without angina pectoris: Secondary | ICD-10-CM | POA: Diagnosis not present

## 2018-04-25 DIAGNOSIS — G40101 Localization-related (focal) (partial) symptomatic epilepsy and epileptic syndromes with simple partial seizures, not intractable, with status epilepticus: Secondary | ICD-10-CM | POA: Diagnosis not present

## 2018-04-25 DIAGNOSIS — I63411 Cerebral infarction due to embolism of right middle cerebral artery: Secondary | ICD-10-CM

## 2018-04-25 DIAGNOSIS — Z888 Allergy status to other drugs, medicaments and biological substances status: Secondary | ICD-10-CM

## 2018-04-25 DIAGNOSIS — M549 Dorsalgia, unspecified: Secondary | ICD-10-CM | POA: Diagnosis not present

## 2018-04-25 DIAGNOSIS — R001 Bradycardia, unspecified: Secondary | ICD-10-CM | POA: Diagnosis not present

## 2018-04-25 DIAGNOSIS — Z791 Long term (current) use of non-steroidal anti-inflammatories (NSAID): Secondary | ICD-10-CM

## 2018-04-25 DIAGNOSIS — Z885 Allergy status to narcotic agent status: Secondary | ICD-10-CM

## 2018-04-25 DIAGNOSIS — Z96612 Presence of left artificial shoulder joint: Secondary | ICD-10-CM | POA: Diagnosis not present

## 2018-04-25 DIAGNOSIS — R531 Weakness: Secondary | ICD-10-CM | POA: Diagnosis not present

## 2018-04-25 DIAGNOSIS — E860 Dehydration: Secondary | ICD-10-CM

## 2018-04-25 DIAGNOSIS — I5032 Chronic diastolic (congestive) heart failure: Secondary | ICD-10-CM | POA: Diagnosis not present

## 2018-04-25 DIAGNOSIS — G894 Chronic pain syndrome: Secondary | ICD-10-CM | POA: Diagnosis not present

## 2018-04-25 DIAGNOSIS — R9401 Abnormal electroencephalogram [EEG]: Secondary | ICD-10-CM | POA: Diagnosis not present

## 2018-04-25 DIAGNOSIS — R258 Other abnormal involuntary movements: Secondary | ICD-10-CM | POA: Diagnosis not present

## 2018-04-25 DIAGNOSIS — R29705 NIHSS score 5: Secondary | ICD-10-CM | POA: Diagnosis present

## 2018-04-25 DIAGNOSIS — Z79891 Long term (current) use of opiate analgesic: Secondary | ICD-10-CM | POA: Diagnosis not present

## 2018-04-25 DIAGNOSIS — Z9689 Presence of other specified functional implants: Secondary | ICD-10-CM | POA: Diagnosis present

## 2018-04-25 DIAGNOSIS — Z96652 Presence of left artificial knee joint: Secondary | ICD-10-CM | POA: Diagnosis present

## 2018-04-25 DIAGNOSIS — R269 Unspecified abnormalities of gait and mobility: Secondary | ICD-10-CM | POA: Diagnosis not present

## 2018-04-25 DIAGNOSIS — G609 Hereditary and idiopathic neuropathy, unspecified: Secondary | ICD-10-CM | POA: Diagnosis not present

## 2018-04-25 DIAGNOSIS — I69354 Hemiplegia and hemiparesis following cerebral infarction affecting left non-dominant side: Secondary | ICD-10-CM | POA: Diagnosis not present

## 2018-04-25 DIAGNOSIS — R402140 Coma scale, eyes open, spontaneous, unspecified time: Secondary | ICD-10-CM | POA: Diagnosis present

## 2018-04-25 DIAGNOSIS — I5030 Unspecified diastolic (congestive) heart failure: Secondary | ICD-10-CM | POA: Diagnosis present

## 2018-04-25 DIAGNOSIS — F17290 Nicotine dependence, other tobacco product, uncomplicated: Secondary | ICD-10-CM | POA: Diagnosis not present

## 2018-04-25 DIAGNOSIS — I639 Cerebral infarction, unspecified: Secondary | ICD-10-CM | POA: Diagnosis not present

## 2018-04-25 DIAGNOSIS — I69398 Other sequelae of cerebral infarction: Secondary | ICD-10-CM | POA: Diagnosis not present

## 2018-04-25 DIAGNOSIS — I6389 Other cerebral infarction: Secondary | ICD-10-CM | POA: Diagnosis not present

## 2018-04-25 DIAGNOSIS — H547 Unspecified visual loss: Secondary | ICD-10-CM | POA: Diagnosis not present

## 2018-04-25 DIAGNOSIS — D72829 Elevated white blood cell count, unspecified: Secondary | ICD-10-CM | POA: Diagnosis present

## 2018-04-25 DIAGNOSIS — Z96641 Presence of right artificial hip joint: Secondary | ICD-10-CM | POA: Diagnosis not present

## 2018-04-25 DIAGNOSIS — I110 Hypertensive heart disease with heart failure: Secondary | ICD-10-CM | POA: Diagnosis present

## 2018-04-25 DIAGNOSIS — R569 Unspecified convulsions: Secondary | ICD-10-CM | POA: Diagnosis not present

## 2018-04-25 DIAGNOSIS — E785 Hyperlipidemia, unspecified: Secondary | ICD-10-CM | POA: Diagnosis present

## 2018-04-25 DIAGNOSIS — I607 Nontraumatic subarachnoid hemorrhage from unspecified intracranial artery: Secondary | ICD-10-CM | POA: Diagnosis not present

## 2018-04-25 DIAGNOSIS — G40109 Localization-related (focal) (partial) symptomatic epilepsy and epileptic syndromes with simple partial seizures, not intractable, without status epilepticus: Secondary | ICD-10-CM

## 2018-04-25 DIAGNOSIS — R402250 Coma scale, best verbal response, oriented, unspecified time: Secondary | ICD-10-CM | POA: Diagnosis present

## 2018-04-25 DIAGNOSIS — R402360 Coma scale, best motor response, obeys commands, unspecified time: Secondary | ICD-10-CM | POA: Diagnosis present

## 2018-04-25 DIAGNOSIS — I25119 Atherosclerotic heart disease of native coronary artery with unspecified angina pectoris: Secondary | ICD-10-CM | POA: Diagnosis not present

## 2018-04-25 DIAGNOSIS — G4700 Insomnia, unspecified: Secondary | ICD-10-CM | POA: Diagnosis not present

## 2018-04-25 DIAGNOSIS — F419 Anxiety disorder, unspecified: Secondary | ICD-10-CM | POA: Diagnosis present

## 2018-04-25 DIAGNOSIS — F5102 Adjustment insomnia: Secondary | ICD-10-CM | POA: Diagnosis not present

## 2018-04-25 DIAGNOSIS — I10 Essential (primary) hypertension: Secondary | ICD-10-CM | POA: Diagnosis not present

## 2018-04-25 DIAGNOSIS — M1990 Unspecified osteoarthritis, unspecified site: Secondary | ICD-10-CM | POA: Diagnosis present

## 2018-04-25 DIAGNOSIS — I63531 Cerebral infarction due to unspecified occlusion or stenosis of right posterior cerebral artery: Secondary | ICD-10-CM | POA: Diagnosis not present

## 2018-04-25 DIAGNOSIS — G8114 Spastic hemiplegia affecting left nondominant side: Secondary | ICD-10-CM | POA: Diagnosis not present

## 2018-04-25 DIAGNOSIS — G629 Polyneuropathy, unspecified: Secondary | ICD-10-CM | POA: Diagnosis not present

## 2018-04-25 DIAGNOSIS — I214 Non-ST elevation (NSTEMI) myocardial infarction: Secondary | ICD-10-CM | POA: Diagnosis not present

## 2018-04-25 DIAGNOSIS — N400 Enlarged prostate without lower urinary tract symptoms: Secondary | ICD-10-CM | POA: Diagnosis present

## 2018-04-25 HISTORY — DX: Angioneurotic edema, initial encounter: T78.3XXA

## 2018-04-25 LAB — COMPREHENSIVE METABOLIC PANEL
ALK PHOS: 74 U/L (ref 38–126)
ALT: 17 U/L (ref 0–44)
ANION GAP: 10 (ref 5–15)
AST: 21 U/L (ref 15–41)
Albumin: 3.5 g/dL (ref 3.5–5.0)
BILIRUBIN TOTAL: 0.5 mg/dL (ref 0.3–1.2)
BUN: 30 mg/dL — ABNORMAL HIGH (ref 8–23)
CALCIUM: 8.6 mg/dL — AB (ref 8.9–10.3)
CO2: 25 mmol/L (ref 22–32)
Chloride: 93 mmol/L — ABNORMAL LOW (ref 98–111)
Creatinine, Ser: 1.37 mg/dL — ABNORMAL HIGH (ref 0.61–1.24)
GFR calc Af Amer: 59 mL/min — ABNORMAL LOW (ref >60)
GFR calc non Af Amer: 51 mL/min — ABNORMAL LOW (ref >60)
Glucose, Bld: 98 mg/dL (ref 70–99)
POTASSIUM: 5.1 mmol/L (ref 3.5–5.1)
Sodium: 128 mmol/L — ABNORMAL LOW (ref 135–145)
TOTAL PROTEIN: 6.3 g/dL — AB (ref 6.5–8.1)

## 2018-04-25 LAB — I-STAT CHEM 8, ED
BUN: 36 mg/dL — ABNORMAL HIGH (ref 8–23)
CREATININE: 1.3 mg/dL — AB (ref 0.61–1.24)
Calcium, Ion: 1.09 mmol/L — ABNORMAL LOW (ref 1.15–1.40)
Chloride: 92 mmol/L — ABNORMAL LOW (ref 98–111)
GLUCOSE: 103 mg/dL — AB (ref 70–99)
HCT: 35 % — ABNORMAL LOW (ref 39.0–52.0)
HEMOGLOBIN: 11.9 g/dL — AB (ref 13.0–17.0)
Potassium: 5 mmol/L (ref 3.5–5.1)
Sodium: 127 mmol/L — ABNORMAL LOW (ref 135–145)
TCO2: 28 mmol/L (ref 22–32)

## 2018-04-25 LAB — SODIUM, URINE, RANDOM: Sodium, Ur: 23 mmol/L

## 2018-04-25 LAB — RAPID URINE DRUG SCREEN, HOSP PERFORMED
Amphetamines: NOT DETECTED
BARBITURATES: NOT DETECTED
Benzodiazepines: NOT DETECTED
Cocaine: NOT DETECTED
Opiates: POSITIVE — AB
TETRAHYDROCANNABINOL: NOT DETECTED

## 2018-04-25 LAB — URINALYSIS, ROUTINE W REFLEX MICROSCOPIC
Bilirubin Urine: NEGATIVE
Glucose, UA: NEGATIVE mg/dL
Hgb urine dipstick: NEGATIVE
KETONES UR: NEGATIVE mg/dL
LEUKOCYTES UA: NEGATIVE
NITRITE: NEGATIVE
Protein, ur: NEGATIVE mg/dL
SPECIFIC GRAVITY, URINE: 1.011 (ref 1.005–1.030)
pH: 6 (ref 5.0–8.0)

## 2018-04-25 LAB — DIFFERENTIAL
BAND NEUTROPHILS: 0 %
BLASTS: 0 %
Basophils Absolute: 0.2 10*3/uL — ABNORMAL HIGH (ref 0.0–0.1)
Basophils Relative: 1 %
EOS ABS: 0.2 10*3/uL (ref 0.0–0.7)
EOS PCT: 1 %
LYMPHS ABS: 3.8 10*3/uL (ref 0.7–4.0)
Lymphocytes Relative: 18 %
METAMYELOCYTES PCT: 0 %
MONOS PCT: 7 %
MYELOCYTES: 0 %
Monocytes Absolute: 1.5 10*3/uL — ABNORMAL HIGH (ref 0.1–1.0)
NEUTROS PCT: 73 %
Neutro Abs: 15.5 10*3/uL — ABNORMAL HIGH (ref 1.7–7.7)
OTHER: 0 %
Promyelocytes Relative: 0 %
nRBC: 0 /100 WBC

## 2018-04-25 LAB — OSMOLALITY: Osmolality: 284 mOsm/kg (ref 275–295)

## 2018-04-25 LAB — CBC
HCT: 35.2 % — ABNORMAL LOW (ref 39.0–52.0)
Hemoglobin: 11.1 g/dL — ABNORMAL LOW (ref 13.0–17.0)
MCH: 28.5 pg (ref 26.0–34.0)
MCHC: 31.5 g/dL (ref 30.0–36.0)
MCV: 90.3 fL (ref 78.0–100.0)
Platelets: 330 10*3/uL (ref 150–400)
RBC: 3.9 MIL/uL — AB (ref 4.22–5.81)
RDW: 14.5 % (ref 11.5–15.5)
WBC: 21.2 10*3/uL — ABNORMAL HIGH (ref 4.0–10.5)

## 2018-04-25 LAB — PROTIME-INR
INR: 1.01
Prothrombin Time: 13.3 seconds (ref 11.4–15.2)

## 2018-04-25 LAB — OSMOLALITY, URINE: Osmolality, Ur: 252 mOsm/kg — ABNORMAL LOW (ref 300–900)

## 2018-04-25 LAB — I-STAT TROPONIN, ED: Troponin i, poc: 0.22 ng/mL (ref 0.00–0.08)

## 2018-04-25 LAB — TSH: TSH: 2.904 u[IU]/mL (ref 0.350–4.500)

## 2018-04-25 LAB — CBG MONITORING, ED: GLUCOSE-CAPILLARY: 88 mg/dL (ref 70–99)

## 2018-04-25 LAB — APTT: aPTT: 26 seconds (ref 24–36)

## 2018-04-25 LAB — MAGNESIUM: Magnesium: 1.9 mg/dL (ref 1.7–2.4)

## 2018-04-25 LAB — TROPONIN I: Troponin I: 2.21 ng/mL (ref <0.03)

## 2018-04-25 MED ORDER — FOSPHENYTOIN SODIUM 500 MG PE/10ML IJ SOLN
1500.0000 mg | INTRAMUSCULAR | Status: AC
  Administered 2018-04-25: 1500 mg via INTRAVENOUS
  Filled 2018-04-25: qty 30

## 2018-04-25 MED ORDER — LORAZEPAM 2 MG/ML IJ SOLN
1.0000 mg | Freq: Once | INTRAMUSCULAR | Status: AC
  Administered 2018-04-25: 1 mg via INTRAVENOUS
  Filled 2018-04-25: qty 1

## 2018-04-25 MED ORDER — AMIODARONE HCL IN DEXTROSE 360-4.14 MG/200ML-% IV SOLN
30.0000 mg/h | INTRAVENOUS | Status: AC
  Administered 2018-04-25 – 2018-04-26 (×2): 30 mg/h via INTRAVENOUS
  Filled 2018-04-25 (×3): qty 200

## 2018-04-25 MED ORDER — ASPIRIN 325 MG PO TABS
325.0000 mg | ORAL_TABLET | Freq: Every day | ORAL | Status: DC
  Administered 2018-04-25 – 2018-04-28 (×4): 325 mg via ORAL
  Filled 2018-04-25 (×4): qty 1

## 2018-04-25 MED ORDER — AMIODARONE HCL IN DEXTROSE 360-4.14 MG/200ML-% IV SOLN
60.0000 mg/h | Freq: Once | INTRAVENOUS | Status: AC
  Administered 2018-04-25: 60 mg/h via INTRAVENOUS
  Filled 2018-04-25: qty 200

## 2018-04-25 MED ORDER — COLCHICINE 0.6 MG PO TABS: 0.6000 mg | ORAL_TABLET | Freq: Three times a day (TID) | ORAL | Status: DC | PRN

## 2018-04-25 MED ORDER — LEVETIRACETAM IN NACL 1500 MG/100ML IV SOLN
1500.0000 mg | INTRAVENOUS | Status: DC
  Filled 2018-04-25: qty 100

## 2018-04-25 MED ORDER — CLOPIDOGREL BISULFATE 75 MG PO TABS
75.0000 mg | ORAL_TABLET | Freq: Every day | ORAL | Status: DC
  Administered 2018-04-26 – 2018-04-28 (×2): 75 mg via ORAL
  Filled 2018-04-25 (×3): qty 1

## 2018-04-25 MED ORDER — OXYCODONE HCL 20 MG PO TABS: 20.0000 mg | ORAL_TABLET | ORAL | Status: DC | PRN

## 2018-04-25 MED ORDER — PANTOPRAZOLE SODIUM 40 MG PO TBEC
40.0000 mg | DELAYED_RELEASE_TABLET | Freq: Every day | ORAL | Status: DC
  Administered 2018-04-26 – 2018-04-28 (×3): 40 mg via ORAL
  Filled 2018-04-25 (×3): qty 1

## 2018-04-25 MED ORDER — IOPAMIDOL (ISOVUE-370) INJECTION 76%
INTRAVENOUS | Status: AC
  Administered 2018-04-25: 50 mL
  Filled 2018-04-25: qty 50

## 2018-04-25 MED ORDER — GABAPENTIN 600 MG PO TABS
300.0000 mg | ORAL_TABLET | Freq: Three times a day (TID) | ORAL | Status: DC
  Administered 2018-04-25 – 2018-04-26 (×3): 300 mg via ORAL
  Administered 2018-04-26: 200 mg via ORAL
  Administered 2018-04-27 – 2018-04-28 (×5): 300 mg via ORAL
  Filled 2018-04-25 (×9): qty 1

## 2018-04-25 MED ORDER — LEVETIRACETAM IN NACL 1000 MG/100ML IV SOLN
1000.0000 mg | Freq: Two times a day (BID) | INTRAVENOUS | Status: DC
  Administered 2018-04-26 (×2): 1000 mg via INTRAVENOUS
  Filled 2018-04-25 (×2): qty 100

## 2018-04-25 MED ORDER — CLOPIDOGREL BISULFATE 75 MG PO TABS
300.0000 mg | ORAL_TABLET | Freq: Once | ORAL | Status: AC
  Administered 2018-04-27: 300 mg via ORAL
  Filled 2018-04-25: qty 4

## 2018-04-25 MED ORDER — ATORVASTATIN CALCIUM 40 MG PO TABS
40.0000 mg | ORAL_TABLET | Freq: Every day | ORAL | Status: DC
  Administered 2018-04-25 – 2018-04-28 (×4): 40 mg via ORAL
  Filled 2018-04-25 (×4): qty 1

## 2018-04-25 MED ORDER — ENOXAPARIN SODIUM 40 MG/0.4ML ~~LOC~~ SOLN
40.0000 mg | SUBCUTANEOUS | Status: DC
  Administered 2018-04-25 – 2018-04-26 (×2): 40 mg via SUBCUTANEOUS
  Filled 2018-04-25 (×2): qty 0.4

## 2018-04-25 MED ORDER — HYDROMORPHONE HCL 1 MG/ML IJ SOLN
1.0000 mg | Freq: Once | INTRAMUSCULAR | Status: AC
  Administered 2018-04-25: 1 mg via INTRAVENOUS
  Filled 2018-04-25: qty 1

## 2018-04-25 MED ORDER — OXYCODONE HCL 5 MG PO TABS
20.0000 mg | ORAL_TABLET | ORAL | Status: DC | PRN
  Administered 2018-04-25 – 2018-04-28 (×15): 20 mg via ORAL
  Filled 2018-04-25 (×16): qty 4

## 2018-04-25 MED ORDER — AMIODARONE IV BOLUS ONLY 150 MG/100ML
150.0000 mg | Freq: Once | INTRAVENOUS | Status: AC
  Administered 2018-04-25: 150 mg via INTRAVENOUS
  Filled 2018-04-25: qty 100

## 2018-04-25 MED ORDER — AMIODARONE LOAD VIA INFUSION: 150.0000 mg | Freq: Once | INTRAVENOUS | Status: DC

## 2018-04-25 MED ORDER — LORAZEPAM 2 MG/ML IJ SOLN
2.0000 mg | Freq: Once | INTRAMUSCULAR | Status: AC
  Administered 2018-04-25: 2 mg via INTRAVENOUS
  Filled 2018-04-25: qty 1

## 2018-04-25 NOTE — Progress Notes (Addendum)
NEURO HOSPITALIST   Requesting Physician: Dr. Darl Householder     Chief Complaint: Left side weakness, sensory decline, and transient L vision blurriness  History obtained from:  Patient/Chart  HPI:                                                                                                                                         William Jordan is an 72 y.o. male with a PMH of Anxiety, HTN, Idiopathic peripheral neuropathy with nerve stimulator, chronic pain (under pain management, opoid abuse). Presents today with c/o L arm weakness, and inability to use, L side weakness and sensory decline, and transient L eye vision impairment and blurriness which is back to baseline. Patient states that his symptoms started Friday, but he was reluctant to come to hospital because "his wife seemed annoyed" since he'd just been d/'c'd from [facility] for treatment of angioedema. Initial NIHSS per nursing was 7, and now patient score 6 for arm  improvement. He denies HA, SOB, or any current vision changes. He admits to chest pain during this assessment that cause him to rise up abruptly holding his, while verbalizing the chest pain intensity to be high. Concurrent with pain patient displayed a 5 beat run of vtach on tele which was confirmed by RN and Dr. Darl Householder on strip and runs of [place]. Amnio drip was initiated. Patient is taken to CT from angio and image studied to r/o stroke.   No known family hx of stroke.          Modified Rankin: Rankin Score=3  NIHSS: 5  Past Medical History:  Diagnosis Date  . Angioedema    from Benazepril reaction  . Anxiety    takes Celexa  . Arthritis   . Chronic back pain   . Enlarged prostate   . History of blood transfusion   . Hypertension   . Neuropathy     Past Surgical History:  Procedure Laterality Date  . COLONOSCOPY     one polyp removed - benign  . ELBOW SURGERY Right   . HERNIA REPAIR     umbilical  . JOINT REPLACEMENT  Left    knee, x 2  . JOINT REPLACEMENT Right    hip  . JOINT REPLACEMENT Left    partial shoulder  . SPINAL CORD STIMULATOR INSERTION N/A 09/28/2014   Procedure: LUMBAR SPINAL CORD STIMULATOR INSERTION;  Surgeon: Melina Schools, MD;  Location: [facility];  Service: Orthopedics;  Laterality: N/A;  . TONSILLECTOMY    . VASECTOMY      Family History  Problem Relation Age of Onset  . Heart disease Father  Social History:  reports that he has been smoking e-cigarettes. He has quit using smokeless tobacco.  His smokeless tobacco use included snuff. He reports that he drinks alcohol. He reports that he does not use drugs.  Allergies:  Allergies  Allergen Reactions  . Morphine Other (See Comments)    Causes confusion and "makes me crazy," per the patient    Medications:                                                                                                                          Scheduled: .  HYDROmorphone (DILAUDID) injection  1 mg Intravenous Once  . LORazepam  1 mg Intravenous Once   Continuous:  PRN:  ROS:  Listed in HPI                                                                                                                                     History obtained from chart review and the patient  General ROS: negative for - chills, fatigue, fever, night sweats, weight gain or weight loss Psychological ROS: negative for - , hallucinations, memory difficulties, mood swings or  Ophthalmic ROS: negative for - blurry vision, double vision, eye pain or loss of vision ENT ROS: negative for - epistaxis, nasal discharge, oral lesions, sore throat, tinnitus or vertigo Respiratory ROS: negative for - cough,  shortness of breath or wheezing Cardiovascular ROS: negative for - chest pain, dyspnea on exertion,  Gastrointestinal ROS: negative for - abdominal pain, diarrhea,  nausea/vomiting or stool incontinence Genito-Urinary ROS: negative for - dysuria, hematuria, incontinence or  urinary frequency/urgency Musculoskeletal ROS: negative for - joint swelling or muscular weakness Neurological ROS: as noted in HPI  General Examination:                                                                                                      Blood pressure 128/85, pulse 70, temperature 98 F (36.7 C), temperature source Oral, resp. rate 12,  height 5\' 10"  (1.778 m), weight 83.9 kg, SpO2 96 %.  HEENT-  Normocephalic, no lesions, without obvious abnormality.  Normal external eye and conjunctiva. Cardiovascular- irregular rhythm 5 beat run of vtach on tele monitor, pulses palpable throughout  Lungs-no rhonchi or wheezing noted, no excessive working breathing.  Saturations within normal limits Abdomen- All 4 quadrants palpated and nontender Extremities- with spasm and some rigidity between during spasms Musculoskeletal-no joint tenderness, deformity or swelling Skin-warm and dry, no hyperpigmentation, vitiligo, or suspicious lesions  Neurological Examination Mental Status: Alert, oriented, thought content appropriate.  Speech fluent without evidence of aphasia.  Able to follow 3 step commands without difficulty, naming and complex exam intact  Cranial Nerves: II: Discs flat bilaterally; Visual fields grossly normal,  III,IV, VI: ptosis not present, extra-ocular motions intact bilaterally, pupils equal, round, reactive to light and accommodation V,VII: smile symmetric, facial light touch sensation normal bilaterally VIII: hearing normal bilaterally IX,X: uvula rises symmetrically XI: bilateral shoulder shrug XII: midline tongue extension Motor: Right : Upper extremity   4/5    Lower extremity   4/5  LUE  Proximal strength/flexion 4-/5, Distal Extension 3-/5; AG (+3)  LLE  4-/5 with (drift) ( +2) Rhythmic jerking or left upper extremity.  Sensory: Left sensory decline, No extinction  Deep Tendon Reflexes: 2+ and symmetric throughout Plantars: Right: downgoing   Left:  downgoing Cerebellar: normal finger-to-nose on R Gait: Did not examine    Lab Results: Basic Metabolic Panel: Recent Labs  Lab 04/25/18 1305  NA 127*  K 5.0  CL 92*  GLUCOSE 103*  BUN 36*  CREATININE 1.30*    CBC: Recent Labs  Lab 04/25/18 1305  HGB 11.9*  HCT 35.0*    Lipid Panel: No results for input(s): CHOL, TRIG, HDL, CHOLHDL, VLDL, LDLCALC in the last 168 hours.  CBG: Recent Labs  Lab 04/25/18 1246  GLUCAP 88    Imaging: No results found.    NEUROHOSPITALIST ADDENDUM Performed face to face evaluation with the patient.   I have reviewed the contents of history and physical exam as documented by PA/ARNP/Resident and have made changes to the above note. .  Recommendations as below.   Assessment:  William Jordan is an 72 y.o. male with a PMH of Anxiety, HTN, Idiopathic peripheral neuropathy with nerve stimulator, chronic pain (under pain management, opoid abuse). Presents today with c/o L arm weakness, and inability to use, L side weakness and sensory decline, and transient L eye vision impairment and blurriness which is back to baseline that started 3 days ago. Reluctant to present since he was just d/cd from [place] today. Patients symptoms consistent with stroke possibly in R MCA.  However patient also noted to have intermittent right arm jerking concerning for focal status epilepticus. Was loaded with Keppra 1,500mg  stat and received 3mg  of Ativan.    Right MCA subacute stroke     Recommend -- BP goal : SBP 120-150 -- CT Brain, CTA head and neck- Patient unable to receive MRI d/t nerve stimulator.  -- Amnio per MD for arrhythmia Vtach noted at bedside- and managed with pain meds- allergic to morphine   --Echocardiogram -- Prophylactic therapy-Antiplatelet med -- High intensity Statin ( check tolerability) ( atorvastatin 40mg /Rosuvastatin 20mg ) daily -- HgbA1c, fasting lipid panel -- PT consult, OT consult, Speech consult --Telemetry  monitoring --Frequent neuro checks --Stroke swallow screen ( NPO until official swallow screen if you think patient will fail  Focal status epilepticus - stat Keppra 1.5g (however not  given ?) - continuing to have seizure - started  Fosphenytoin 20mg /kg - transfer to step down unit, close watching for airway protection  Troponemia/Ventricular tachycardia -management per primary  - will avoid using Vimpat   Karena Addison Aroor MD [facility] [phone]   If 7pm to 7am, please call on call as listed on AMION.   Addendum   Patient initially evaluated by Dr Leonel Ramsay, who saw the left shoulder jerking and loaded patient with Keppra. Patient apparently alert x 4, following commands. Following Keppra , patient is now lethargic, but arousble to painful stimulation and will ask you to leave him alone. However, he continues to have rhythmic jerking of left upper extremity. Will load with Fosphenytoin.  Seizures likely following stroke in R MCA.( discussed CT scan with radiologist - showed cortical hypoattenuation along the right pre and postcentral gyrus. Felt likely to be  acute/subacute nonhemorrhagic infarct there is effacement adjacent sulci, and not remote SAH. No obvious sagittal sinus thrombosis was seen in CTA.   Addendum  I later found patient never received Keppra to begin with- Phenytoin already started.  After rechecking on patient around 11pm no longer having jerking movements in the left arm.  Will switch to [place] for maintenance due to better side effect profile, however if he starts to have seizures again I would recommend reloading with Dilantin.      This patient is neurologically critically ill due to stroke and focal status epilepticus. He is at risk for significant risk of neurological worsening and death from worsening seizures requiring intubation and  ICU transfer, worsening stroke, infection. Currently maintaining airway and stable.  This patient's care  requires constant monitoring of vital signs, hemodynamics, respiratory and cardiac monitoring, review of multiple databases, neurological assessment, discussion with family, other specialists and medical decision making of high complexity.  I spent 70  minutes of neurocritical time in the care of this patient.

## 2018-04-25 NOTE — ED Notes (Signed)
ED Provider at bedside. 

## 2018-04-25 NOTE — Progress Notes (Signed)
Noted HR to be sinus brady between 44-48, contacted MD to notify. Stopped Amiodarone drip temporarily, while awaiting MD assessment, will continue to monitor.  William Jordan

## 2018-04-25 NOTE — ED Notes (Signed)
Cardiology at bedside.

## 2018-04-25 NOTE — Progress Notes (Addendum)
Elevated troponin: Spoke with on-call cardiology fellow following the Troponin of 2.21. Based on the concern for possible hemorrhagic CVA, lack of concerning EKG findings or typical chest pain, the recommendation to hold therapeutic anticoagulation was made and to delay intervention at this time if neurology was in agreement with this plan. In addition, a mild degree of troponin elevation is expected in acute CVA's. Will monitor  Addendum: noted Trop of 3.09, as per cardiology fellow, if without EKG changes would continue to observe given overall status. Will repeat Trop in 6 hours.   Plan: Repeat EKG for chest pain and notify MD.  Conservative management, close monitoring in stepdown.  Continue troponin trend Monitor vitals closely Vitals:   04/25/18 1832 04/25/18 2035  BP: 124/81   Pulse:  (!) 51  Resp:  20  Temp:  98.1 F (36.7 C)  SpO2:      Focal Status: Nursing informed the IMTS teamt to contact the neurologist treating the patient overnight Dr. Lorraine Lax. Dr. Lorraine Lax indicated that he was treating the patient for focal status epilepticus with dilantin. Orders had been placed for Keppra but this treatment was unfortunately delayed prompting the order change as prompt treatment was advised. Neurology did not feel strongly toward treating the increased troponin at this time and agreed with Cardiologies current recommendations.   Plan: Focal status:  Fosphenytoin (as per neurology)  Stepdown level of care  Telemetry monitoring  Seizure precautions   Neurology to repeat CT? And ordered CT angio but feel that he had a  subacute CVA  Addendum:  Visited patient due to concerns for bradycardia. Upon arrival at the bedside the patient was sleeping calmly in his bed. He was not arousable by voice. Vitals appeared stable with HR ~48bpm. Patient given therapeutic loading dose of fosphenytoin @ ~ 2117 hours most likely the cause of his somnolence given the relative value of his vitals and  progression thus far.  Vitals:   04/25/18 2210 04/25/18 2235  BP: 109/78 111/74  Pulse: (!) 46 (!) 48  Resp: 18 19  Temp: 97.8 F (36.6 C) 98 F (36.7 C)  SpO2: 98% 97%    Plan: Continue Amiodarone. Monitor HR, if decreased to <40 or patient develops hypotension would recommend holding amiodarone and contacting his primary team and/or on call cardiology fellow.   Kathi Ludwig, MD Internal Medicine PGY-2 Pager # [phone]

## 2018-04-25 NOTE — Consult Note (Signed)
Cardiology Consultation:   Patient ID: William Jordan; [id number]; [birth year]   Admit date: 04/25/2018 Date of Consult: 04/25/2018  Primary Care Provider: Townsend Roger, MD Primary Cardiologist: William Jordan.     Patient Profile:   William Jordan is a 72 y.o. male with a hx of HTN, chronic back pain who is being seen today for the evaluation of chest pain at the request of Dr William Jordan.  History of Present Illness:   William Jordan is a 72 yo male history of HTN, chronic pain on home opiates,and recent admission to [facility] with angioedema from ACE-I,  admitted with left arm and leg weakness. Cardiology consulted for episode of chest pain in ER as well as 5-6 beat run of NSVT.   Patient reports no cardiac symptoms at home, came in due to left sided weakness. While in ER had 10 second episode of sharp chest pain that resolved on its own. No recurrent symptoms. Around that time had 5-6 beat run of NSVT that was asymptomatic. Started on amio drip by ER staff. Currently no symptoms.   istat labs Na 127 K 5 Cr 1.09 (primary labs pending) istatTrop 0.22--> CXR pending CTA neck pending Jan 2017 echo LVEF 60-65%, no WMAs, grade I diastolic dysfunction, mild MR EKG SR, upsloping ST elevation inferior leads   04/21/2018 [facility] echo: LVEF 65-70%, grade II diastolic dysfunction   Past Medical History:  Diagnosis Date  . Angioedema    from Benazepril reaction  . Anxiety    takes Celexa  . Arthritis   . Chronic back pain   . Enlarged prostate   . History of blood transfusion   . Hypertension   . Neuropathy     Past Surgical History:  Procedure Laterality Date  . COLONOSCOPY     one polyp removed - benign  . ELBOW SURGERY Right   . HERNIA REPAIR     umbilical  . JOINT REPLACEMENT Left    knee, x 2  . JOINT REPLACEMENT Right    hip  . JOINT REPLACEMENT Left    partial shoulder  . SPINAL CORD STIMULATOR INSERTION N/A 09/28/2014   Procedure: LUMBAR SPINAL CORD STIMULATOR INSERTION;  Surgeon:  William Schools, MD;  Location: [facility];  Service: Orthopedics;  Laterality: N/A;  . TONSILLECTOMY    . VASECTOMY       Inpatient Medications: Scheduled Meds:  Continuous Infusions:  PRN Meds:   Allergies:    Allergies  Allergen Reactions  . Morphine Other (See Comments)    Causes confusion and "makes me crazy," per the patient    Social History:   Social History   Socioeconomic History  . Marital status: Married    Spouse name: Not on file  . Number of children: Not on file  . Years of education: Not on file  . Highest education level: Not on file  Occupational History  . Not on file  Social Needs  . Financial resource strain: Not on file  . Food insecurity:    Worry: Not on file    Inability: Not on file  . Transportation needs:    Medical: Not on file    Non-medical: Not on file  Tobacco Use  . Smoking status: Current Every Day Smoker    Types: E-cigarettes  . Smokeless tobacco: Former Systems developer    Types: Snuff  . Tobacco comment: does not use any tobacco products any more  Substance and Sexual Activity  . Alcohol use: Yes    Comment:  occ  . Drug use: No  . Sexual activity: Never  Lifestyle  . Physical activity:    Days per week: Not on file    Minutes per session: Not on file  . Stress: Not on file  Relationships  . Social connections:    Talks on phone: Not on file    Gets together: Not on file    Attends religious service: Not on file    Active member of club or organization: Not on file    Attends meetings of clubs or organizations: Not on file    Relationship status: Not on file  . Intimate partner violence:    Fear of current or ex partner: Not on file    Emotionally abused: Not on file    Physically abused: Not on file    Forced sexual activity: Not on file  Other Topics Concern  . Not on file  Social History Narrative  . Not on file    Family History:    Family History  Problem Relation Age of Onset  . Heart disease Father      ROS:    Please see the history of present illness.  All other ROS reviewed and negative.     Physical Exam/Data:   Vitals:   04/25/18 1219 04/25/18 1230 04/25/18 1330 04/25/18 1345  BP:  124/79 128/85 128/85  Pulse:  68  70  Resp:  11 (!) 26 12  Temp:      TempSrc:      SpO2:  98%  96%  Weight: 83.9 kg     Height: 5\' 10"  (1.778 m)       Intake/Output Summary (Last 24 hours) at 04/25/2018 1408 Last data filed at 04/25/2018 1356 Gross per 24 hour  Intake 90.52 ml  Output 500 ml  Net -409.48 ml   Filed Weights   04/25/18 1219  Weight: 83.9 kg   Body mass index is 26.54 kg/m.  General:  Well nourished, well developed, in no acute distress HEENT: normal Lymph: no adenopathy Neck: no JVD Endocrine:  No thryomegaly Cardiac:  normal S1, S2; RRR; no murmur  Lungs:  clear to auscultation bilaterally, no wheezing, rhonchi or rales  Abd: soft, nontender, no hepatomegaly  Ext: no edema Musculoskeletal:  No deformities, BUE and BLE strength normal and equal Skin: warm and dry  Neuro:  Left side arm and leg weakness Psych:  Normal affect     Laboratory Data:  Chemistry Recent Labs  Lab 04/25/18 1305  NA 127*  K 5.0  CL 92*  GLUCOSE 103*  BUN 36*  CREATININE 1.30*    No results for input(s): PROT, ALBUMIN, AST, ALT, ALKPHOS, BILITOT in the last 168 hours. Hematology Recent Labs  Lab 04/25/18 1305  HGB 11.9*  HCT 35.0*   Cardiac EnzymesNo results for input(s): TROPONINI in the last 168 hours.  Recent Labs  Lab 04/25/18 1303  TROPIPOC 0.22*    BNPNo results for input(s): BNP, PROBNP in the last 168 hours.  DDimer No results for input(s): DDIMER in the last 168 hours.  Radiology/Studies:  No results found.  Assessment and Plan:   1. Weakness - presents with significant left sided deficit, outside tpa window - back stimulator not compatible with MRI. He is going for CTA head and neck   2. Chest pain - isolated 10 second episode of sharp chest pain - trop  0.22 in setting of CVA. Patient with chest pain - EKG SR, upsloping ST elevations in inferior  leads, isoelectric to TP segment. Not consistent with STEMI. ST segment changes can be seen in CVA alone. Other possibility would be embolic event to cerebral and coronary systems.  - follow trop trend, management is significant limited by acute CVA presentation. - follow serial troponins, EKGs. Would repeat echo even though he had one just a few days ago at [facility] when he presented with angioedema  - at this time overall nonspecific findings. Would avoid antiocoagulation given recent CVA and risk of hemorrhagic conversion. Would start aspirin, high dose statin.   3. NSVT - 5-6 beat run of NSVT in ER. K 5, Mg pending, TSH pending. Could be related to acute catecholamine release related to CVA, or if true cardiac ischemia is present may be related.  - started on IV amiodarone by ER staff, reasonable to continue for 24 hours as we work to sort out his cardiac status, would not anticipate longer duration unless significant continued runs.   4. Leukocytosis - per primary team, significant elevation to 21. Perhaps reactionary to CVA.    5. Hyponatremia - per primary team  For questions or updates, please contact [facility] Please consult [email or web address] for contact info under Cardiology/STEMI.   Merrily Pew, MD  04/25/2018 2:08 PM

## 2018-04-25 NOTE — ED Notes (Signed)
Pt assisted to standing to side of bed with urinal, one assist. Pt reports self cathing at home with some difficulties urinating at home.

## 2018-04-25 NOTE — Plan of Care (Signed)

## 2018-04-25 NOTE — ED Notes (Signed)
Contacted main lab to add on blood work. 

## 2018-04-25 NOTE — ED Triage Notes (Signed)
Pt reports left sided weakness. Grips equal but unable to lift up left arm, weakness to left leg. Ambulation difficulty. Started about 04/23/18 2300. Pt recently d/c from [facility] on 8/1 for angioedema. Recent med change of stopping Lotensin and started Spironolactone. A&Ox4. Pt reporting muscle spasms that started yesterday. Pt has nerual stimulator for neuropathy pain.  EMS VSS BP 140/100, P 75, SpO2 98% room air, CBG 130.

## 2018-04-25 NOTE — H&P (Signed)
Date: 04/25/2018               Patient Name:  William Jordan MRN: [id number]  DOB: [birth year] Age / Sex: 72 y.o., male   PCP: Townsend Roger, MD         Medical Service: Internal Medicine Teaching Service         Attending Physician: Dr. Annia Belt, MD    First Contact: Dr. Sherry Ruffing Pager: [phone]  Second Contact: Dr. Hetty Ely Pager: [phone]       After Hours (After 5p/  First Contact Pager: [phone]  weekends / holidays): Second Contact Pager: [phone]   Chief Complaint: Left arm and leg weakness  History of Present Illness: This is a 72 year old male with a PMH of HTN, chronic back pain, idiopathic peripheral neuropathy (with nerve stimulator) who presented with left arm and leg weakness, also had an episode of chest pain in the ER with a 5-6 beat run of NSVT. He appeared to be in pain and was somewhat reluctant to provide information. The patient reports that this started on Friday night, 2 days prior to arrival, he says he was sitting in a chair when his left arm and left leg became weak and his left eye became very blurry. He says that it got worse since then. Today his left arm started shaking and had constant jerking  movement. He has never had this before. He denies anything else going on, such as fevers, chills, nausea, diarrhea, constipation, headaches. He was just discharged from [facility] for treatment of angioedema. Of note he has a nerve stimulator in the back and he states that he keeps it on all the time. He started having chest pain while in the ED, lasted for about 10 seconds, and was found to have 5-6 beats of NSVTs. He was recently diagnosed with CHF.  Echocardiogram in 2017: EF 52-84%, diastolic dysfunction, could not evaluate valves.   In the ED he was found to be afebrile, RR 16, HR 67, BP 128/85. Labs showed a Na 127, BUN 36, Cr 1.3 (in 3/30 Bun 14 and Cr 1, 3/29 had BUN 30, Cr 2.12), Hgb 11.9. Troponin elevated to 0.22. CBC showed WBC of 21.2, Hgb 11.1, MCV  90.3. Mg and TSH were WNL. CXR showed no active cardiopulmonary disease. CT head showed multiple areas of atherosclerotic changes in right common carotid, carotid bifurcations, left vertebral artery, bilateral cavernous internal carotids, medical and small vessel disease in anterior and posterior.  circulation. His EKG showed some st elevation in the inferior leads but isoelectric to TP. He developed asymptomatic NSVT for 5-6 beats so he was started on amiodarone drip by ED. Cardiology evaluated, recommended following serial troponins, repeat echo, recommends starting ASA and high dose statin, continue amiodarone drip for 24 hours. Neurology evaluated for the CVA, and thinks it's likely a stroke possibly in the R MCA. Patient was admitted to internal medicine.  Meds:  Current Meds  Medication Sig  . cholecalciferol (VITAMIN D) 1000 units tablet Take 2,000 Units by mouth daily.  . colchicine 0.6 MG tablet Take 0.6 mg by mouth 3 (three) times daily as needed (for gout flares).   . docusate sodium (COLACE) 100 MG capsule Take 2 capsules (200 mg total) by mouth at bedtime. (Patient taking differently: Take 100 mg by mouth at bedtime. )  . omeprazole (PRILOSEC) 20 MG capsule Take 20 mg by mouth daily.   . Saw Palmetto, Serenoa repens, (SAW PALMETTO  PO) Take 1 capsule by mouth daily.     Allergies: Allergies as of 04/25/2018 - Review Complete 04/25/2018  Allergen Reaction Noted  . Ace inhibitors Other (See Comments) 04/18/2018  . Morphine Other (See Comments) 06/23/2017  . Sulfa antibiotics Other (See Comments) 04/25/2018   Past Medical History:  Diagnosis Date  . Angioedema    from Benazepril reaction  . Anxiety    takes Celexa  . Arthritis   . Chronic back pain   . Enlarged prostate   . History of blood transfusion   . Hypertension   . Neuropathy     Family History: Denies any family history  Social History: Denies tobacco use, reports occasional EtOH use, 2 beers per week. Denies any  drug use.   Review of Systems: A complete ROS was negative except as per HPI.   Physical Exam: Blood pressure 130/81, pulse 74, temperature 98 F (36.7 C), temperature source Oral, resp. rate (!) 23, height 5\' 10"  (1.778 m), weight 83.9 kg, SpO2 99 %. General: alert, cooperative, appears stated age and moderate distress HEENT: PERRLA and neck supple with midline trachea Heart: S1, S2 normal, no murmur, rub or gallop, regular rate and rhythm Lungs: clear to auscultation, no wheezes or rales and unlabored breathing Abdomen: abdomen is soft without significant tenderness, masses, organomegaly or guarding Extremities: extremities normal, atraumatic, no cyanosis or edema Musculoskeletal: Left foot had area of gout Skin:no rashes, no wounds Neurology: Alert and oriented x3, sppech fluent, CN 2-12 intact, Right upper and lower extremity 5/5, left upper extremity 4/5, left lower extremity 4.5, left shoulder constant twitching and jerking movements, DTRs 2+ and symmetric Psychiatry: Normal mood and affect  EKG: personally reviewed my interpretation is 68 bpm, normal sinus rhythm, some st elevation in inferior leads  CXR: personally reviewed my interpretation is poor inspiration, mild cardiomegaly, clear costodiaphragmatic angles  Assessment & Plan by Problem: his is a 72 year old male with a PMH of HTN, chronic back pain, idiopathic peripheral neuropathy (with nerve stimulator) who presented with left arm and leg weakness, also had an episode of chest pain in the ER with a 5-6 beat run of NSVT.   Left arm and leg weakness Patient presented with left sided weakness and left eye blurriness that started on Friday when he was just sitting around. His left arm jerking around. He was found to be hemodynamically stable in the ED. On exam there was minimal decrease in strength on his left side, but he had constant motions of his left arm.  CTA head showed multiple areas of atherosclerotic changes in right  common carotid, carotid bifurcations, left vertebral artery, bilateral cavernous internal carotids, medical and small vessel disease in anterior and posterior circulation. He does have a nerve stimulator in his back which he reports is always on, he did not have his remote with him today. He is unable to get a MRI due to this. This could be a stroke due to the weakness and eye blurriness however his exam wasn't that impressive and was difficult to assess due to his constant left arm jerking motion. It's possible that this could be related to his nerve stimulator in his back and we asked the partner to bring the remote to see if this could be contributing to this.  -- Neurology consult -- Allow for permissive HTN in the setting of systolic < 497 and diastolic < 026 -- ASA 378 mg / 81 mg daily  -- Lipitor -- Echocardiogram  -- A1C  --  Lipid panel  -- Tele monitoring  -- SLP eval -- PT/OT  Chest pain, NSVT: Patient had one episode of chest pain while in the ED, was sharp in nature. Had an episode fo 5-6 beat NSVT around that time. Was started on amiodarone drip by ED. Troponin was 0.22, EKG showed some elevated ST elevations in inferior leads, isoelectric to TP segment. Cardiology evaluated and did not think this was consistent with STEMI, can be seen with a CVA, embolism to cerebral or coronary systems. He did not have any symptoms since that episode.  -Cardiology on board -Trend troponin's -Repeat Echo -ASA 325 -Lipitor 40 mg daily -Amiodarone drip, cardiology recommended to continue for 24 hours, will decrease to 59ml/hr.   HTN: Normotensive on exam. On amlodipine at home.  -Hold home medications   Chronic back pain Idiopathic peripheral neuropathy: Has a nerve stimulator in the back. He states that he has it always on.  -Oxycodone 20mg  q4 hours for pain -Continue gabapentin  GERD:  -Pantoprazole  FEN: No fluids, replete lytes prn, regular VTE ppx: SCDs Code Status: FULL   Dispo:  Admit patient to Inpatient with expected length of stay greater than 2 midnights.  Signed: Asencion Noble, MD 04/25/2018, 4:02 PM  Pager: [phone]

## 2018-04-25 NOTE — ED Notes (Addendum)
Patient transported to [place] with RN

## 2018-04-25 NOTE — ED Notes (Addendum)
Neurology NP at bedside. Witnessed 5-6 run of VTach. EKG shot. MD Darl Householder notified.

## 2018-04-25 NOTE — ED Provider Notes (Signed)
[facility] EMERGENCY DEPARTMENT Provider Note   CSN: [id number] Arrival date & time: 04/25/18  1205     History   Chief Complaint Chief Complaint  Patient presents with  . Weakness    HPI William Jordan is a 72 y.o. male hx of HTN, neuropathy, chronic back pain with spinal stimulator, here with L arm and leg weakness. He was admitted on 8/4 to [facility] for angioedema and was taken off ACEI. He was doing well until about 9 pm on 8/9 (2 days ago).  He states that at that time he had sudden onset of left arm and leg weakness.  Over the last 24 hours the weakness has not improved because it is really got worse.  He states that he was unable to move his arm and had trouble walking.  Patient denies any headaches or trouble speaking.  Did admit to numbness in the left arm as well.  Patient has a spinal stimulator that is not MRI compatible    The history is provided by the patient.    Past Medical History:  Diagnosis Date  . Angioedema    from Benazepril reaction  . Anxiety    takes Celexa  . Arthritis   . Chronic back pain   . Enlarged prostate   . History of blood transfusion   . Hypertension   . Neuropathy     Patient Active Problem List   Diagnosis Date Noted  . CVA (cerebral vascular accident) ([place]) 04/25/2018  . Opiate overdose ([place]) 12/10/2017  . Acute kidney injury ([place]) 12/10/2017  . Kidney lesion, native, right 12/10/2017  . Abuse, drug or alcohol ([place]) 10/05/2015  . PNA (pneumonia) 10/05/2015  . Bacteremia due to Staphylococcus aureus 10/05/2015  . Chronic pain syndrome 10/02/2015  . Acute respiratory failure with hypoxia ([place])   . Aspiration pneumonia ([place])   . Hematemesis   . Respiratory failure ([place]) 09/26/2015  . Encephalopathy, unspecified   . Chronic back pain   . Muscle spasm of back   . Altered mental status 08/14/2015  . AKI (acute kidney injury) ([place]) 08/14/2015  . Anemia 08/14/2015  . Altered mental state 08/14/2015  . Pain in left  wrist   . Gout attack 06/18/2015  . Septic joint of left wrist ([place]) 06/16/2015  . History of hypertension 06/16/2015  . Leukocytosis 06/16/2015  . Septic joint ([place]) 06/16/2015  . Acute right ankle pain 06/10/2015  . HCAP (healthcare-associated pneumonia) 06/09/2015  . Hypokalemia 06/09/2015  . Acute renal failure syndrome ([place])   . Acute encephalopathy   . Staphylococcus aureus bacteremia with sepsis ([place]) 06/07/2015  . Constipation 06/06/2015  . Toxic metabolic encephalopathy 70/35/0093  . Hyperkalemia 06/05/2015  . Low back pain with sciatica 04/03/2015  . H/O total hip arthroplasty 04/03/2015  . Idiopathic peripheral neuropathy 09/29/2014  . Chronic pain 09/28/2014  . DDD (degenerative disc disease), lumbar 12/30/2012  . Degenerative arthritis of thoracic spine 12/30/2012  . Localized osteoarthrosis 12/13/2012  . Chronic pain associated with significant psychosocial dysfunction 12/13/2012  . Polypharmacy 12/13/2012  . Long term current use of opiate analgesic 12/13/2012    Past Surgical History:  Procedure Laterality Date  . COLONOSCOPY     one polyp removed - benign  . ELBOW SURGERY Right   . HERNIA REPAIR     umbilical  . JOINT REPLACEMENT Left    knee, x 2  . JOINT REPLACEMENT Right    hip  . JOINT REPLACEMENT Left    partial  shoulder  . SPINAL CORD STIMULATOR INSERTION N/A 09/28/2014   Procedure: LUMBAR SPINAL CORD STIMULATOR INSERTION;  Surgeon: Melina Schools, MD;  Location: [facility];  Service: Orthopedics;  Laterality: N/A;  . TONSILLECTOMY    . VASECTOMY          Home Medications    Prior to Admission medications   Medication Sig Start Date End Date Taking? Authorizing Provider  alfuzosin (UROXATRAL) 10 MG 24 hr tablet Take 10 mg by mouth daily after supper.   Yes [provider]  amLODipine (NORVASC) 10 MG tablet Take 1 tablet (10 mg total) by mouth daily. 12/12/17 12/12/18 Yes Emokpae, Courage, MD  celecoxib (CELEBREX) 100 MG capsule Take 100 mg  by mouth 2 (two) times daily.    Yes [provider]  cetirizine (ZYRTEC) 10 MG tablet Take 10 mg by mouth 2 (two) times daily.   Yes [provider]  cholecalciferol (VITAMIN D) 1000 units tablet Take 2,000 Units by mouth daily.   Yes [provider]  citalopram (CELEXA) 20 MG tablet Take 20 mg by mouth daily.   Yes [provider]  cloNIDine (CATAPRES) 0.2 MG tablet Take 0.2 mg by mouth 2 (two) times daily.   Yes [provider]  colchicine 0.6 MG tablet Take 0.6 mg by mouth 3 (three) times daily as needed (for gout flares).    Yes [provider]  docusate sodium (COLACE) 100 MG capsule Take 2 capsules (200 mg total) by mouth at bedtime. Patient taking differently: Take 100 mg by mouth at bedtime.  10/02/15  Yes Debbe Odea, MD  gabapentin (NEURONTIN) 600 MG tablet Take 300 mg by mouth 3 (three) times daily.    Yes [provider]  omeprazole (PRILOSEC) 20 MG capsule Take 20 mg by mouth daily.    Yes [provider]  Oxycodone HCl 20 MG TABS Take 1 tablet (20 mg total) by mouth every 8 (eight) hours as needed. Patient taking differently: Take 20 mg by mouth every 4 (four) hours as needed (pain).  12/12/17  Yes Emokpae, Courage, MD  Saw Palmetto, Serenoa repens, (SAW PALMETTO PO) Take 1 capsule by mouth daily.   Yes [provider]  morphine (MS CONTIN) 15 MG 12 hr tablet Take 1 tablet (15 mg total) by mouth every 12 (twelve) hours. Patient not taking: Reported on 12/10/2017 10/02/15   Debbe Odea, MD  polyethylene glycol (MIRALAX / GLYCOLAX) packet Take 17 g by mouth daily. Patient not taking: Reported on 04/25/2018 06/12/15   Eugenie Filler, MD  vitamin B-12 (CYANOCOBALAMIN) 100 MCG tablet Take 1 tablet (100 mcg total) by mouth daily. Patient not taking: Reported on 04/25/2018 08/17/15   Caren Griffins, MD    Family History Family History  Problem Relation Age of Onset  . Heart disease Father     Social  History Social History   Tobacco Use  . Smoking status: Current Every Day Smoker    Types: E-cigarettes  . Smokeless tobacco: Former Systems developer    Types: Snuff  . Tobacco comment: does not use any tobacco products any more  Substance Use Topics  . Alcohol use: Yes    Comment: occ  . Drug use: No     Allergies   Ace inhibitors; Morphine; and Sulfa antibiotics   Review of Systems Review of Systems  Neurological: Positive for weakness.  All other systems reviewed and are negative.    Physical Exam Updated Vital Signs BP 124/81 (BP Location: Right Arm)  Pulse (!) 51   Temp 98.1 F (36.7 C) (Oral)   Resp 20   Ht 5\' 10"  (1.778 m)   Wt 83.9 kg   SpO2 97%   BMI 26.54 kg/m   Physical Exam  Constitutional: He is oriented to person, place, and time.  Chronically ill   HENT:  Head: Normocephalic.  Mouth/Throat: Oropharynx is clear and moist.  Eyes: Pupils are equal, round, and reactive to light. Conjunctivae and EOM are normal.  Neck: Normal range of motion. Neck supple.  Cardiovascular: Normal rate, regular rhythm and normal heart sounds.  Pulmonary/Chest: Effort normal and breath sounds normal. No stridor. No respiratory distress.  Abdominal: Soft. Bowel sounds are normal. He exhibits no distension. There is no tenderness.  Musculoskeletal: Normal range of motion.  Neurological: He is alert and oriented to person, place, and time.  CN 2- 12 intact. Strength 2/5 L arm, 3/5 L leg, 5/5 R side. Dec sensation L arm and leg   Skin: Skin is warm. Capillary refill takes less than 2 seconds.  Psychiatric: He has a normal mood and affect.  Nursing note and vitals reviewed.    ED Treatments / Results  Labs (all labs ordered are listed, but only abnormal results are displayed) Labs Reviewed  CBC - Abnormal; Notable for the following components:      Result Value   WBC 21.2 (*)    RBC 3.90 (*)    Hemoglobin 11.1 (*)    HCT 35.2 (*)    All other components within normal  limits  DIFFERENTIAL - Abnormal; Notable for the following components:   Neutro Abs 15.5 (*)    Monocytes Absolute 1.5 (*)    Basophils Absolute 0.2 (*)    All other components within normal limits  COMPREHENSIVE METABOLIC PANEL - Abnormal; Notable for the following components:   Sodium 128 (*)    Chloride 93 (*)    BUN 30 (*)    Creatinine, Ser 1.37 (*)    Calcium 8.6 (*)    Total Protein 6.3 (*)    GFR calc non Af Amer 51 (*)    GFR calc Af Amer 59 (*)    All other components within normal limits  TROPONIN I - Abnormal; Notable for the following components:   Troponin I 2.21 (*)    All other components within normal limits  URINALYSIS, ROUTINE W REFLEX MICROSCOPIC - Abnormal; Notable for the following components:   Color, Urine STRAW (*)    All other components within normal limits  I-STAT TROPONIN, ED - Abnormal; Notable for the following components:   Troponin i, poc 0.22 (*)    All other components within normal limits  I-STAT CHEM 8, ED - Abnormal; Notable for the following components:   Sodium 127 (*)    Chloride 92 (*)    BUN 36 (*)    Creatinine, Ser 1.30 (*)    Glucose, Bld 103 (*)    Calcium, Ion 1.09 (*)    Hemoglobin 11.9 (*)    HCT 35.0 (*)    All other components within normal limits  PROTIME-INR  APTT  MAGNESIUM  TSH  OSMOLALITY  TROPONIN I  LIPID PANEL  HEMOGLOBIN A1C  RAPID URINE DRUG SCREEN, HOSP PERFORMED  HIV ANTIBODY (ROUTINE TESTING)  BASIC METABOLIC PANEL  CBC  OSMOLALITY, URINE  SODIUM, URINE, RANDOM  CBG MONITORING, ED    EKG EKG Interpretation  Date/Time:  Sunday April 25 2018 13:35:15 EDT Ventricular Rate:  76 PR Interval:  QRS Duration: 81 QT Interval:  381 QTC Calculation: 429 R Axis:   88 Text Interpretation:  Sinus rhythm Borderline right axis deviation ST elevation, consider inferior injury No significant change since last tracing Confirmed by Wandra Arthurs [phone]) on 04/25/2018 1:59:57 PM   Radiology Ct Angio Head W  Or Wo Contrast  Addendum Date: 04/25/2018   ADDENDUM REPORT: 04/25/2018 19:41 ADDENDUM: Cortical hypoattenuation along the right pre and postcentral gyrus is consistent with acute/subacute nonhemorrhagic infarct there is effacement adjacent sulci. Associated subacute arachnoid hemorrhage is considered less likely. This is likely all related to a subacute ischemic event. Findings were discussed with Dr. Lorraine Lax at 7:40 p.m. Electronically Signed   By: San Morelle M.D.   On: 04/25/2018 19:41   Result Date: 04/25/2018 CLINICAL DATA:  Left-sided weakness. Walking for 2 days. Cerebral aneurysm subarachnoid hemorrhage, cerebral vasospasm evaluation. EXAM: CT ANGIOGRAPHY HEAD AND NECK TECHNIQUE: Multidetector CT imaging of the head and neck was performed using the standard protocol during bolus administration of intravenous contrast. Multiplanar CT image reconstructions and MIPs were obtained to evaluate the vascular anatomy. Carotid stenosis measurements (when applicable) are obtained utilizing NASCET criteria, using the distal internal carotid diameter as the denominator. CONTRAST:  56mL ISOVUE-370 IOPAMIDOL (ISOVUE-370) INJECTION 76% COMPARISON:  Head without contrast 06/23/2017 FINDINGS: CT HEAD FINDINGS Brain: [name] matter hypoattenuation is present along the right pre and postcentral gyrus. No hemorrhage is present. There is no mass lesion. Ventricles proportionate to the degree of atrophy. Moderate diffuse white matter hypoattenuation is present bilaterally. Brainstem and cerebellum are otherwise normal. Vascular: Atherosclerotic calcifications are present within the cavernous internal carotid arteries bilaterally. There is hyperdense vessel. Skull: Calvarium is intact. No focal lytic or blastic lesions are present. Sinuses: The paranasal sinuses and mastoid air cells are clear. Orbits: Globes and orbits are within bilaterally. Review of the MIP images confirms the above findings CTA NECK FINDINGS Aortic  arch: There is a common origin of the innominate and left common carotid artery. Mild atherosclerotic changes are present. There is no significant stenosis the great vessel origins Right carotid system: The right common carotid artery is within normal limits proximally. Eccentric atherosclerotic changes are present in the distal right common carotid artery. Lumen is narrowed to 3.5 mm. There is no stenosis relative to the more distal vessel. Atherosclerotic calcifications are present at the carotid bifurcation without significant stenosis in the cervical ICA. Left carotid system: Left carotid artery is tortuous proximally. Atherosclerotic changes are present at the left carotid bifurcation. No significant stenosis relative the distal vessel. The cervical left ICA is normal. Vertebral arteries: The vertebral arteries are codominant. Both vertebral arteries originate from the subclavian arteries without significant stenosis. No significant injury or stenosis to either vertebral artery in the neck. Skeleton: Multilevel degenerative changes are present in the cervical spine. Grade 1 anterolisthesis is present at C3-4. There is chronic loss disc height with endplate uncovertebral disease at C4-5, C5-6, and C6-7. Osseous foraminal narrowing is greater right than left at C4-5 and C5-6. Other neck: The soft tissues of the neck are otherwise unremarkable. Salivary glands are within normal limits. No significant adenopathy is present. Thyroid is heterogeneous without a dominant lesion. No focal mucosal lesions are present. Upper chest: Paraseptal emphysematous changes are present along a disease. There is no focal nodule or mass lesion. Effusion is present. There is no pneumothorax. The thoracic inlet is within normal limits. Review of the MIP images confirms the above findings CTA HEAD FINDINGS Anterior circulation: Atherosclerotic calcifications  are present in the cavernous internal carotid arteries bilaterally. There is no  significant stenosis from the skull base to the ICA termini. The A1 and M1 segments are normal. The anterior communicating artery is patent. MCA bifurcations are within normal limits. There is moderate attenuation of distal ACA and MCA branch vessels bilaterally without a significant proximal stenosis or occlusion Posterior circulation: The vertebral arteries are codominant. Atherosclerotic changes are present at the dural margin of the left vertebral artery without significant stenosis. Vertebrobasilar junction is normal. Both posterior cerebral arteries originate from the basilar tip. There is some attenuation of distal PCA branch vessels bilaterally without a significant proximal stenosis or occlusion. Venous sinuses: The dural sinuses are patent. Straight sinus and deep cerebral veins are intact. Cortical veins are within normal limits. Anatomic variants: None Delayed phase: The postcontrast images demonstrate no pathologic enhancement. Review of the MIP images confirms the above findings IMPRESSION: 1. Atherosclerotic changes within the mid and distal right common carotid artery narrows the lumen to 3.5 mm without a significant stenosis relative to the more distal vessel. 2. Atherosclerotic changes bilaterally at the carotid bifurcations without significant stenoses. 3. Atherosclerotic changes at the dural margin of the left vertebral artery without a significant stenosis. 4. Atherosclerotic changes of the cavernous internal carotid arteries bilaterally without significant stenosis. 5. Distal medium and small vessel disease is present throughout the anterior and posterior circulation without a significant proximal stenosis, aneurysm, or branch vessel occlusion otherwise. 6. Multilevel degenerative changes of the cervical spine as described above. Grade 1 anterolisthesis is present at C3-4. Electronically Signed: By: San Morelle M.D. On: 04/25/2018 14:58   Dg Chest 2 View  Result Date:  04/25/2018 CLINICAL DATA:  Elevated troponin.  Weakness. EXAM: CHEST - 2 VIEW COMPARISON:  April 18, 2018. FINDINGS: The heart size and mediastinal contours are within normal limits. Both lungs are clear. The visualized skeletal structures are stable. Spinal stimulator lead is unchanged. IMPRESSION: No active cardiopulmonary disease. Electronically Signed   By: Abelardo Diesel M.D.   On: 04/25/2018 14:38   Ct Angio Neck W And/or Wo Contrast  Addendum Date: 04/25/2018   ADDENDUM REPORT: 04/25/2018 19:41 ADDENDUM: Cortical hypoattenuation along the right pre and postcentral gyrus is consistent with acute/subacute nonhemorrhagic infarct there is effacement adjacent sulci. Associated subacute arachnoid hemorrhage is considered less likely. This is likely all related to a subacute ischemic event. Findings were discussed with Dr. Lorraine Lax at 7:40 p.m. Electronically Signed   By: San Morelle M.D.   On: 04/25/2018 19:41   Result Date: 04/25/2018 CLINICAL DATA:  Left-sided weakness. Walking for 2 days. Cerebral aneurysm subarachnoid hemorrhage, cerebral vasospasm evaluation. EXAM: CT ANGIOGRAPHY HEAD AND NECK TECHNIQUE: Multidetector CT imaging of the head and neck was performed using the standard protocol during bolus administration of intravenous contrast. Multiplanar CT image reconstructions and MIPs were obtained to evaluate the vascular anatomy. Carotid stenosis measurements (when applicable) are obtained utilizing NASCET criteria, using the distal internal carotid diameter as the denominator. CONTRAST:  47mL ISOVUE-370 IOPAMIDOL (ISOVUE-370) INJECTION 76% COMPARISON:  Head without contrast 06/23/2017 FINDINGS: CT HEAD FINDINGS Brain: [name] matter hypoattenuation is present along the right pre and postcentral gyrus. No hemorrhage is present. There is no mass lesion. Ventricles proportionate to the degree of atrophy. Moderate diffuse white matter hypoattenuation is present bilaterally. Brainstem and cerebellum  are otherwise normal. Vascular: Atherosclerotic calcifications are present within the cavernous internal carotid arteries bilaterally. There is hyperdense vessel. Skull: Calvarium is intact. No focal lytic or blastic  lesions are present. Sinuses: The paranasal sinuses and mastoid air cells are clear. Orbits: Globes and orbits are within bilaterally. Review of the MIP images confirms the above findings CTA NECK FINDINGS Aortic arch: There is a common origin of the innominate and left common carotid artery. Mild atherosclerotic changes are present. There is no significant stenosis the great vessel origins Right carotid system: The right common carotid artery is within normal limits proximally. Eccentric atherosclerotic changes are present in the distal right common carotid artery. Lumen is narrowed to 3.5 mm. There is no stenosis relative to the more distal vessel. Atherosclerotic calcifications are present at the carotid bifurcation without significant stenosis in the cervical ICA. Left carotid system: Left carotid artery is tortuous proximally. Atherosclerotic changes are present at the left carotid bifurcation. No significant stenosis relative the distal vessel. The cervical left ICA is normal. Vertebral arteries: The vertebral arteries are codominant. Both vertebral arteries originate from the subclavian arteries without significant stenosis. No significant injury or stenosis to either vertebral artery in the neck. Skeleton: Multilevel degenerative changes are present in the cervical spine. Grade 1 anterolisthesis is present at C3-4. There is chronic loss disc height with endplate uncovertebral disease at C4-5, C5-6, and C6-7. Osseous foraminal narrowing is greater right than left at C4-5 and C5-6. Other neck: The soft tissues of the neck are otherwise unremarkable. Salivary glands are within normal limits. No significant adenopathy is present. Thyroid is heterogeneous without a dominant lesion. No focal mucosal  lesions are present. Upper chest: Paraseptal emphysematous changes are present along a disease. There is no focal nodule or mass lesion. Effusion is present. There is no pneumothorax. The thoracic inlet is within normal limits. Review of the MIP images confirms the above findings CTA HEAD FINDINGS Anterior circulation: Atherosclerotic calcifications are present in the cavernous internal carotid arteries bilaterally. There is no significant stenosis from the skull base to the ICA termini. The A1 and M1 segments are normal. The anterior communicating artery is patent. MCA bifurcations are within normal limits. There is moderate attenuation of distal ACA and MCA branch vessels bilaterally without a significant proximal stenosis or occlusion Posterior circulation: The vertebral arteries are codominant. Atherosclerotic changes are present at the dural margin of the left vertebral artery without significant stenosis. Vertebrobasilar junction is normal. Both posterior cerebral arteries originate from the basilar tip. There is some attenuation of distal PCA branch vessels bilaterally without a significant proximal stenosis or occlusion. Venous sinuses: The dural sinuses are patent. Straight sinus and deep cerebral veins are intact. Cortical veins are within normal limits. Anatomic variants: None Delayed phase: The postcontrast images demonstrate no pathologic enhancement. Review of the MIP images confirms the above findings IMPRESSION: 1. Atherosclerotic changes within the mid and distal right common carotid artery narrows the lumen to 3.5 mm without a significant stenosis relative to the more distal vessel. 2. Atherosclerotic changes bilaterally at the carotid bifurcations without significant stenoses. 3. Atherosclerotic changes at the dural margin of the left vertebral artery without a significant stenosis. 4. Atherosclerotic changes of the cavernous internal carotid arteries bilaterally without significant stenosis. 5.  Distal medium and small vessel disease is present throughout the anterior and posterior circulation without a significant proximal stenosis, aneurysm, or branch vessel occlusion otherwise. 6. Multilevel degenerative changes of the cervical spine as described above. Grade 1 anterolisthesis is present at C3-4. Electronically Signed: By: San Morelle M.D. On: 04/25/2018 14:58    Procedures Procedures (including critical care time)  CRITICAL CARE Performed by: Shanon Brow  Tamsen Meek   Total critical care time: 30 minutes  Critical care time was exclusive of separately billable procedures and treating other patients.  Critical care was necessary to treat or prevent imminent or life-threatening deterioration.  Critical care was time spent personally by me on the following activities: development of treatment plan with patient and/or surrogate as well as nursing, discussions with consultants, evaluation of patient's response to treatment, examination of patient, obtaining history from patient or surrogate, ordering and performing treatments and interventions, ordering and review of laboratory studies, ordering and review of radiographic studies, pulse oximetry and re-evaluation of patient's condition.   Medications Ordered in ED Medications  aspirin tablet 325 mg (325 mg Oral Given 04/25/18 1616)  atorvastatin (LIPITOR) tablet 40 mg (40 mg Oral Given 04/25/18 1811)  oxyCODONE (Oxy IR/ROXICODONE) immediate release tablet 20 mg (20 mg Oral Given 04/25/18 1810)  pantoprazole (PROTONIX) EC tablet 40 mg (has no administration in time range)  gabapentin (NEURONTIN) tablet 300 mg (300 mg Oral Given 04/25/18 1811)  enoxaparin (LOVENOX) injection 40 mg (has no administration in time range)  amiodarone (NEXTERONE PREMIX) 360-4.14 MG/200ML-% (1.8 mg/mL) IV infusion (30 mg/hr Intravenous New Bag/Given 04/25/18 2137)  levETIRAcetam (KEPPRA) IVPB 1500 mg/ 100 mL premix (has no administration in time range)    clopidogrel (PLAVIX) tablet 300 mg (has no administration in time range)  clopidogrel (PLAVIX) tablet 75 mg (has no administration in time range)  iopamidol (ISOVUE-370) 76 % injection (50 mLs  Contrast Given 04/25/18 1330)  amiodarone (NEXTERONE) IV bolus only 150 mg/100 mL (0 mg Intravenous Stopped 04/25/18 1356)  amiodarone (NEXTERONE PREMIX) 360-4.14 MG/200ML-% (1.8 mg/mL) IV infusion (0 mg/hr Intravenous Stopped 04/25/18 2121)  LORazepam (ATIVAN) injection 1 mg (1 mg Intravenous Given 04/25/18 1438)  HYDROmorphone (DILAUDID) injection 1 mg (1 mg Intravenous Given 04/25/18 1437)  LORazepam (ATIVAN) injection 2 mg (2 mg Intravenous Given 04/25/18 1840)  fosPHENYtoin (CEREBYX) 1,500 mg PE in sodium chloride 0.9 % 50 mL IVPB (1,500 mg PE Intravenous New Bag/Given 04/25/18 2117)     Initial Impression / Assessment and Plan / ED Course  I have reviewed the triage vital signs and the nursing notes.  Pertinent labs & imaging results that were available during my care of the patient were reviewed by me and considered in my medical decision making (see chart for details).     MACY LINGENFELTER is a 72 y.o. male here with L arm and leg weakness. He has spinal stimulator that is not MRI compatible. Concerned for stroke given significant weakness. He is outside of TPA window and doesn't qualify for LVO. Since he can't get MRI brain, will get CT angio head/neck. Will need admission for stroke workup.   1 pm I called Dr. Leonel Ramsay from neurology, who agreed with CTA head/neck and he will see patient.   1:30 pm Patient's trop 0.22. Developed substernal chest pain while neuro was evaluating patient. Was noted to have 5 beats of V tach on the monitor. EKG showed sinus rhythm. Ordered amiodarone IV bolus and drip. Will consult cardiology.   2 pm I called Dr. Harl Bowie from cardiology to see patient.   3 pm CTA showed no dissection. Started on amiodarone drip. ASA ordered by neurology. Will admit to unassigned  medicine for stroke workup, V tach on amiodarone drip, and elevated trop.      Final Clinical Impressions(s) / ED Diagnoses   Final diagnoses:  Cerebrovascular accident (CVA), unspecified mechanism ([place])  Elevated troponin  Ventricular tachycardia ([place])  ED Discharge Orders    None       Drenda Freeze, MD 04/25/18 2206

## 2018-04-26 ENCOUNTER — Inpatient Hospital Stay (HOSPITAL_COMMUNITY): Payer: PPO

## 2018-04-26 ENCOUNTER — Other Ambulatory Visit: Payer: Self-pay

## 2018-04-26 DIAGNOSIS — I63531 Cerebral infarction due to unspecified occlusion or stenosis of right posterior cerebral artery: Secondary | ICD-10-CM

## 2018-04-26 DIAGNOSIS — R778 Other specified abnormalities of plasma proteins: Secondary | ICD-10-CM

## 2018-04-26 DIAGNOSIS — R7989 Other specified abnormal findings of blood chemistry: Secondary | ICD-10-CM

## 2018-04-26 DIAGNOSIS — I10 Essential (primary) hypertension: Secondary | ICD-10-CM

## 2018-04-26 DIAGNOSIS — K219 Gastro-esophageal reflux disease without esophagitis: Secondary | ICD-10-CM

## 2018-04-26 DIAGNOSIS — Z9689 Presence of other specified functional implants: Secondary | ICD-10-CM

## 2018-04-26 DIAGNOSIS — I672 Cerebral atherosclerosis: Secondary | ICD-10-CM

## 2018-04-26 DIAGNOSIS — I214 Non-ST elevation (NSTEMI) myocardial infarction: Secondary | ICD-10-CM

## 2018-04-26 DIAGNOSIS — I639 Cerebral infarction, unspecified: Secondary | ICD-10-CM

## 2018-04-26 DIAGNOSIS — Z882 Allergy status to sulfonamides status: Secondary | ICD-10-CM

## 2018-04-26 DIAGNOSIS — D72829 Elevated white blood cell count, unspecified: Secondary | ICD-10-CM

## 2018-04-26 DIAGNOSIS — E860 Dehydration: Secondary | ICD-10-CM

## 2018-04-26 DIAGNOSIS — I472 Ventricular tachycardia, unspecified: Secondary | ICD-10-CM

## 2018-04-26 DIAGNOSIS — M549 Dorsalgia, unspecified: Secondary | ICD-10-CM

## 2018-04-26 DIAGNOSIS — I63411 Cerebral infarction due to embolism of right middle cerebral artery: Secondary | ICD-10-CM

## 2018-04-26 DIAGNOSIS — Z79899 Other long term (current) drug therapy: Secondary | ICD-10-CM

## 2018-04-26 DIAGNOSIS — G629 Polyneuropathy, unspecified: Secondary | ICD-10-CM

## 2018-04-26 DIAGNOSIS — Z885 Allergy status to narcotic agent status: Secondary | ICD-10-CM

## 2018-04-26 DIAGNOSIS — G8929 Other chronic pain: Secondary | ICD-10-CM

## 2018-04-26 DIAGNOSIS — G40109 Localization-related (focal) (partial) symptomatic epilepsy and epileptic syndromes with simple partial seizures, not intractable, without status epilepticus: Secondary | ICD-10-CM

## 2018-04-26 DIAGNOSIS — M109 Gout, unspecified: Secondary | ICD-10-CM

## 2018-04-26 DIAGNOSIS — R569 Unspecified convulsions: Secondary | ICD-10-CM

## 2018-04-26 DIAGNOSIS — Z888 Allergy status to other drugs, medicaments and biological substances status: Secondary | ICD-10-CM

## 2018-04-26 DIAGNOSIS — E871 Hypo-osmolality and hyponatremia: Secondary | ICD-10-CM

## 2018-04-26 DIAGNOSIS — I6389 Other cerebral infarction: Secondary | ICD-10-CM

## 2018-04-26 DIAGNOSIS — G8194 Hemiplegia, unspecified affecting left nondominant side: Secondary | ICD-10-CM

## 2018-04-26 DIAGNOSIS — H538 Other visual disturbances: Secondary | ICD-10-CM

## 2018-04-26 DIAGNOSIS — N179 Acute kidney failure, unspecified: Secondary | ICD-10-CM

## 2018-04-26 DIAGNOSIS — Z79891 Long term (current) use of opiate analgesic: Secondary | ICD-10-CM

## 2018-04-26 LAB — BASIC METABOLIC PANEL
ANION GAP: 11 (ref 5–15)
BUN: 19 mg/dL (ref 8–23)
CHLORIDE: 95 mmol/L — AB (ref 98–111)
CO2: 28 mmol/L (ref 22–32)
CREATININE: 1.02 mg/dL (ref 0.61–1.24)
Calcium: 8.8 mg/dL — ABNORMAL LOW (ref 8.9–10.3)
GFR calc non Af Amer: >60 mL/min (ref >60)
Glucose, Bld: 89 mg/dL (ref 70–99)
POTASSIUM: 4.8 mmol/L (ref 3.5–5.1)
SODIUM: 134 mmol/L — AB (ref 135–145)

## 2018-04-26 LAB — LIPID PANEL
CHOLESTEROL: 157 mg/dL (ref 0–200)
HDL: 60 mg/dL (ref >40)
LDL Cholesterol: 72 mg/dL (ref 0–99)
TRIGLYCERIDES: 123 mg/dL (ref <150)
Total CHOL/HDL Ratio: 2.6 RATIO
VLDL: 25 mg/dL (ref 0–40)

## 2018-04-26 LAB — CBC
HEMATOCRIT: 35.7 % — AB (ref 39.0–52.0)
HEMOGLOBIN: 11.6 g/dL — AB (ref 13.0–17.0)
MCH: 28.9 pg (ref 26.0–34.0)
MCHC: 32.5 g/dL (ref 30.0–36.0)
MCV: 89 fL (ref 78.0–100.0)
PLATELETS: 300 10*3/uL (ref 150–400)
RBC: 4.01 MIL/uL — AB (ref 4.22–5.81)
RDW: 14.5 % (ref 11.5–15.5)
WBC: 16 10*3/uL — AB (ref 4.0–10.5)

## 2018-04-26 LAB — ECHOCARDIOGRAM COMPLETE
Height: 70 in
Weight: 2960 oz

## 2018-04-26 LAB — TROPONIN I
TROPONIN I: 3.09 ng/mL — AB (ref <0.03)
TROPONIN I: 2.83 ng/mL — AB (ref <0.03)

## 2018-04-26 LAB — HIV ANTIBODY (ROUTINE TESTING W REFLEX): HIV Screen 4th Generation wRfx: NONREACTIVE

## 2018-04-26 LAB — CK: Total CK: 166 U/L (ref 49–397)

## 2018-04-26 MED ORDER — SODIUM CHLORIDE 0.9 % IV SOLN
INTRAVENOUS | Status: DC | PRN
  Administered 2018-04-26: 11:00:00 via INTRAVENOUS

## 2018-04-26 MED ORDER — RAMELTEON 8 MG PO TABS
8.0000 mg | ORAL_TABLET | Freq: Every day | ORAL | Status: DC
  Administered 2018-04-26 – 2018-04-27 (×2): 8 mg via ORAL
  Filled 2018-04-26 (×3): qty 1

## 2018-04-26 NOTE — Discharge Summary (Signed)
Name: William Jordan MRN: [id number] DOB: [birth year] 72 y.o. PCP: Townsend Roger, MD  Date of Admission: 04/25/2018 12:05 PM Date of Discharge: 04/28/2018 04/28/2018  5:59 PM Attending Physician: William Jordan  Discharge Diagnosis: 1. CVA 2.NSTEMI 3. AKI 4. HTN  Discharge Medications: Allergies as of 04/28/2018      Reactions   Ace Inhibitors Other (See Comments)   Angioedema (throat swelling)   Morphine Other (See Comments)   Causes confusion and "makes me crazy," per the patient   Sulfa Antibiotics Other (See Comments)   Unknown reaction      Medication List    STOP taking these medications   amLODipine 10 MG tablet Commonly known as:  NORVASC   cloNIDine 0.2 MG tablet Commonly known as:  CATAPRES     TAKE these medications   alfuzosin 10 MG 24 hr tablet Commonly known as:  UROXATRAL Take 10 mg by mouth daily after supper.   aspirin 325 MG tablet Take 1 tablet (325 mg total) by mouth daily. Start taking on:  04/29/2018   atorvastatin 40 MG tablet Commonly known as:  LIPITOR Take 1 tablet (40 mg total) by mouth daily at 6 PM.   celecoxib 100 MG capsule Commonly known as:  CELEBREX Take 100 mg by mouth 2 (two) times daily.   cetirizine 10 MG tablet Commonly known as:  ZYRTEC Take 10 mg by mouth 2 (two) times daily.   cholecalciferol 1000 units tablet Commonly known as:  VITAMIN D Take 2,000 Units by mouth daily.   citalopram 20 MG tablet Commonly known as:  CELEXA Take 20 mg by mouth daily.   clopidogrel 75 MG tablet Commonly known as:  PLAVIX Take 1 tablet (75 mg total) by mouth daily. Start taking on:  04/29/2018   colchicine 0.6 MG tablet Take 0.6 mg by mouth 3 (three) times daily as needed (for gout flares).   docusate sodium 100 MG capsule Commonly known as:  COLACE Take 2 capsules (200 mg total) by mouth at bedtime. What changed:  how much to take   gabapentin 600 MG tablet Commonly known as:  NEURONTIN Take 300 mg by mouth 3  (three) times daily.   morphine 15 MG 12 hr tablet Commonly known as:  MS CONTIN Take 1 tablet (15 mg total) by mouth every 12 (twelve) hours.   omeprazole 20 MG capsule Commonly known as:  PRILOSEC Take 20 mg by mouth daily.   Oxycodone HCl 20 MG Tabs Take 1 tablet (20 mg total) by mouth every 8 (eight) hours as needed. What changed:    when to take this  reasons to take this   polyethylene glycol packet Commonly known as:  MIRALAX / GLYCOLAX Take 17 g by mouth daily.   SAW PALMETTO PO Take 1 capsule by mouth daily.   vitamin B-12 100 MCG tablet Commonly known as:  CYANOCOBALAMIN Take 1 tablet (100 mcg total) by mouth daily.       Disposition and follow-up:   William Jordan was discharged from [facility] in Stable condition.  At the hospital follow up visit please address:  1. Should be on aspirin and plavix for 3 months following discharge and then only plavix.  He was normotensive on admission so his blood pressure medications should be held on discharge. Please assess blood pressure and what medications he is taking on hospital follow up.  He should have a cardiology and neurology follow up.   2.  Labs /  imaging needed at time of follow-up: None  3.  Pending labs/ test needing follow-up: None  Follow-up Appointments: Follow-up Information    [facility] Follow up on 05/10/2018.   Specialty:  Cardiology Why:  at 4:30PM  Contact information: [address] [place] [facility] Follow up on 05/20/2018.   Specialty:  Cardiology Why:  Please arrive 15 minutes early for your 9:30am stress test. Please do not eat anything after midnight the night before your test Contact information: [place] [phone]       [facility] Bottcher, [place] Follow up on 05/27/2018.   Specialties:  Physician Assistant,  Cardiology, Radiology Why:  Please arrive 15 minutes early for your 10:00am cardiology appointment  Contact information: [address] [place] [address] [place] [address] [facility]. Schedule an appointment as soon as possible for a visit in 4 week(s).   Specialty:  Radiology Contact information: [address] [place] [phone]       Townsend Roger, MD .   Specialty:  Internal Medicine Contact information: [address] [place] [address] [phone]           Hospital Course by problem list: 1. CVA: This is a 72 year old male with a PMH of HTN, chronic back pain, idiopathic peripheral neuropathy (with nerve stimulator) who presented with left arm and leg weakness, and blurry vision in the left eye of 2 day duration. He also had some rhythmic jerking of his left arm that started in the ED. He was Jordan to have 4/5 strength in the left arm and leg. CTA head showed multiple areas of atherosclerotic changes in right common carotid, carotid bifurcations, left vertebral artery, bilateral cavernous internal carotids, medical and small vessel disease in anterior and posterior circulation, as well as cortical hypoattenuation along the right pre and postcentral gyrus consistent with an acute/subacute nonhemorrhagic infarct.  An MRI was not able to be done due to his TENS unit that he has for chronic back pain. He was started on ASA, plavix and Lipitor. Cholesterol 157, HDL 60, LDL 72, Triglycerides 123. He was put on telemetry which showed William evidence of any cardiac abnormality during admission. TEE showed normal EF 55-60%, with William source of an embolus. Had a loop recorder placed and cardiology will follow this outpatient. He was discharged on DAPT for 3 months, and then just plavix. He was also discharged on lipitor.   For his rhythmic left shoulder movements neurology thought that this could be  due to a focal seizure like activity so he was loaded with fosphenytoin and started on keppra, they did an EEG which showed William seizure activity and his left shoulder jerking resolved the next day so the AED was stopped. It was unclear what was the cause of this movement. He does have a TENS unit in place and he stated that he always kept it on so this could have contributed to this abnormal movement.   2.NSTEMI: Patient had a short episode of chest pain while in the ED, and had a 5-6 beat episode of NSVT. He was put on a amiodarone drip while in the ED but this was stopped the next day. His troponin was elevated at 2.21, 3.09 and 2.83. EKG showed some elevated ST  elevations in inferior leads, isoelectric to TP segment. CK was 166.  Cardiology was on board, agreed with his DAPT and statin with blood pressure control. Will do a OP lexiscan myoview after rehab is complete.    3. AKI: On admission BUN 30, Cr 1.37, elevated from base line.8/12/19BUN 19 and Cr 1.02.    4. HTN: Was on clonidine and amlodipine at home. He was normotensive during admission when home medications were held. These were discontinued on discharge.   Discharge Vitals:   BP (!) 153/94 (BP Location: Left Arm)   Pulse 80   Temp 98.3 F (36.8 C) (Oral)   Resp 18   Ht 5\' 10"  (1.778 m)   Wt 83.9 kg   SpO2 100%   BMI 26.54 kg/m   Pertinent Labs, Studies, and Procedures:  Troponin (Point of Care Test) BMP Latest Ref Rng & Units 04/27/2018 04/26/2018 04/25/2018  Glucose 70 - 99 mg/dL - 89 103(H)  BUN 8 - 23 mg/dL - 19 36(H)  Creatinine 0.61 - 1.24 mg/dL 0.99 1.02 1.30(H)  Sodium 135 - 145 mmol/L - 134(L) 127(L)  Potassium 3.5 - 5.1 mmol/L - 4.8 5.0  Chloride 98 - 111 mmol/L - 95(L) 92(L)  CO2 22 - 32 mmol/L - 28 -  Calcium 8.9 - 10.3 mg/dL - 8.8(L) -   CT 8/13:   IMPRESSION: 1. William acute hemorrhage. 2. Unchanged appearance of multiple areas of acute to subacute ischemia in the right parietal and occipital  lobes.  04/25/18 CT Head and neck: IMPRESSION: 1. Atherosclerotic changes within the mid and distal right common carotid artery narrows the lumen to 3.5 mm without a significant stenosis relative to the more distal vessel. 2. Atherosclerotic changes bilaterally at the carotid bifurcations without significant stenoses. 3. Atherosclerotic changes at the dural margin of the left vertebral artery without a significant stenosis. 4. Atherosclerotic changes of the cavernous internal carotid arteries bilaterally without significant stenosis. 5. Distal medium and small vessel disease is present throughout the anterior and posterior circulation without a significant proximal stenosis, aneurysm, or branch vessel occlusion otherwise. 6. Multilevel degenerative changes of the cervical spine as described above. Grade 1 anterolisthesis is present at C3-4. ADDENDUM: Cortical hypoattenuation along the right pre and postcentral gyrus is consistent with acute/subacute nonhemorrhagic infarct there is effacement adjacent sulci. Associated subacute arachnoid hemorrhage is considered less likely. This is likely all related to a subacute ischemic event. Findings were discussed with Dr. Lorraine Lax at 7:40 p.m.  04/26/18: Echocardiogram Study Conclusions   - Left ventricle: The cavity size was normal. Wall thickness was   increased in a pattern of mild LVH. Systolic function was normal.   The estimated ejection fraction was in the range of 60% to 65%.  Discharge Instructions: Discharge Instructions    Ambulatory referral to Neurology   Complete by:  As directed    Follow up with stroke clinic NP (Jessica Vanschaick or Cecille Rubin, if both not available, consider Zachery Dauer, or [place]) at [facility] in about 4 weeks. Thanks.      Signed: Asencion Noble, MD 04/28/2018, 10:12 PM   Pager: [phone]

## 2018-04-26 NOTE — Procedures (Signed)
ELECTROENCEPHALOGRAM REPORT   Patient: William Jordan       Room #: 2I71 EEG No. ID: [id number] Age: 72 y.o.        Sex: male Referring Physician: Beryle Beams Report Date:  04/26/2018        Interpreting Physician: Alexis Goodell  History: William Jordan is an 72 y.o. male with left sided weakness  Medications:  ASA, Lipitor, Plavix, Neurontin, Keppra, Protonix, Amiodarone  Conditions of Recording:  This is a 21 channel routine scalp EEG performed with bipolar and monopolar montages arranged in accordance to the international 10/20 system of electrode placement. One channel was dedicated to EKG recording.  The patient is in the awake, drowsy and asleep states.  Description:  The waking background activity consists of a low voltage, symmetrical, fairly well organized, 7 Hz theta activity, seen from the parieto-occipital and posterior temporal regions.  Low voltage fast activity, poorly organized, is seen anteriorly and is at times superimposed on more posterior regions.  A mixture of theta and alpha rhythms are seen from the central and temporal regions. The patient drowses with slowing to irregular, low voltage theta and beta activity.   The patient goes in to a light sleep with symmetrical sleep spindles, vertex central sharp transients and irregular slow activity.   No epileptiform activity is noted.   Hyperventilation and intermittent photic stimulation were not performed.  IMPRESSION: This is an abnormal EEG secondary to posterior background slowing.  This finding may be seen with a diffuse gray matter disturbance that is etiologically nonspecific, but may include a dementia, among other possibilities.   No epileptiform activity is noted.     Alexis Goodell, MD Neurology [phone] 04/26/2018, 10:16 AM

## 2018-04-26 NOTE — Progress Notes (Addendum)
Progress Note  Patient Name: William Jordan Date of Encounter: 04/26/2018  Primary Cardiologist: No primary care provider on file.   Subjective   Denies any chest pain for the past few days, has been having exertional chest pain for "a long time". Continue to have L sided weakness  Inpatient Medications    Scheduled Meds: . aspirin  325 mg Oral Daily  . atorvastatin  40 mg Oral q1800  . clopidogrel  300 mg Oral Once  . clopidogrel  75 mg Oral Daily  . enoxaparin (LOVENOX) injection  40 mg Subcutaneous Q24H  . gabapentin  300 mg Oral TID  . pantoprazole  40 mg Oral Daily   Continuous Infusions: . sodium chloride 10 mL/hr at 04/26/18 1123  . amiodarone Stopped (04/26/18 1359)   PRN Meds: sodium chloride, oxyCODONE   Vital Signs    Vitals:   04/26/18 0435 04/26/18 0634 04/26/18 0801 04/26/18 1256  BP: 108/69  124/76 116/71  Pulse: (!) 49 60 (!) 58 71  Resp: 16 17 17 18   Temp: 98 F (36.7 C) 98.3 F (36.8 C) 97.8 F (36.6 C) 98 F (36.7 C)  TempSrc: Axillary Axillary Oral Oral  SpO2: 96%  98% 99%  Weight:      Height:        Intake/Output Summary (Last 24 hours) at 04/26/2018 1433 Last data filed at 04/26/2018 1110 Gross per 24 hour  Intake 498.21 ml  Output 2120 ml  Net -1621.79 ml   Filed Weights   04/25/18 1219  Weight: 83.9 kg    Telemetry    Sinus bradycardia without significant ventricular ectopy - Personally Reviewed  ECG    NSR - Personally Reviewed  Physical Exam   GEN: No acute distress.   Neck: No JVD Cardiac: RRR, no murmurs, rubs, or gallops.  Respiratory: Clear to auscultation bilaterally. GI: Soft, nontender, non-distended  MS: No edema; L sided weakness Neuro:  Nonfocal  Psych: Normal affect   Labs    Chemistry Recent Labs  Lab 04/25/18 1243 04/25/18 1305 04/26/18 0355  NA 128* 127* 134*  K 5.1 5.0 4.8  CL 93* 92* 95*  CO2 25  --  28  GLUCOSE 98 103* 89  BUN 30* 36* 19  CREATININE 1.37* 1.30* 1.02  CALCIUM 8.6*  --   8.8*  PROT 6.3*  --   --   ALBUMIN 3.5  --   --   AST 21  --   --   ALT 17  --   --   ALKPHOS 74  --   --   BILITOT 0.5  --   --   GFRNONAA 51*  --  >60  GFRAA 59*  --  >60  ANIONGAP 10  --  11     Hematology Recent Labs  Lab 04/25/18 1243 04/25/18 1305 04/26/18 0355  WBC 21.2*  --  16.0*  RBC 3.90*  --  4.01*  HGB 11.1* 11.9* 11.6*  HCT 35.2* 35.0* 35.7*  MCV 90.3  --  89.0  MCH 28.5  --  28.9  MCHC 31.5  --  32.5  RDW 14.5  --  14.5  PLT 330  --  300    Cardiac Enzymes Recent Labs  Lab 04/25/18 1854 04/26/18 0118 04/26/18 0355  TROPONINI 2.21* 3.09* 2.83*    Recent Labs  Lab 04/25/18 1303  TROPIPOC 0.22*     BNPNo results for input(s): BNP, PROBNP in the last 168 hours.   DDimer No results  for input(s): DDIMER in the last 168 hours.   Radiology    Ct Angio Head W Or Wo Contrast  Addendum Date: 04/25/2018   ADDENDUM REPORT: 04/25/2018 19:41 ADDENDUM: Cortical hypoattenuation along the right pre and postcentral gyrus is consistent with acute/subacute nonhemorrhagic infarct there is effacement adjacent sulci. Associated subacute arachnoid hemorrhage is considered less likely. This is likely all related to a subacute ischemic event. Findings were discussed with Dr. Lorraine Lax at 7:40 p.m. Electronically Signed   By: San Morelle M.D.   On: 04/25/2018 19:41   Result Date: 04/25/2018 CLINICAL DATA:  Left-sided weakness. Walking for 2 days. Cerebral aneurysm subarachnoid hemorrhage, cerebral vasospasm evaluation. EXAM: CT ANGIOGRAPHY HEAD AND NECK TECHNIQUE: Multidetector CT imaging of the head and neck was performed using the standard protocol during bolus administration of intravenous contrast. Multiplanar CT image reconstructions and MIPs were obtained to evaluate the vascular anatomy. Carotid stenosis measurements (when applicable) are obtained utilizing NASCET criteria, using the distal internal carotid diameter as the denominator. CONTRAST:  64mL ISOVUE-370  IOPAMIDOL (ISOVUE-370) INJECTION 76% COMPARISON:  Head without contrast 06/23/2017 FINDINGS: CT HEAD FINDINGS Brain: [name] matter hypoattenuation is present along the right pre and postcentral gyrus. No hemorrhage is present. There is no mass lesion. Ventricles proportionate to the degree of atrophy. Moderate diffuse white matter hypoattenuation is present bilaterally. Brainstem and cerebellum are otherwise normal. Vascular: Atherosclerotic calcifications are present within the cavernous internal carotid arteries bilaterally. There is hyperdense vessel. Skull: Calvarium is intact. No focal lytic or blastic lesions are present. Sinuses: The paranasal sinuses and mastoid air cells are clear. Orbits: Globes and orbits are within bilaterally. Review of the MIP images confirms the above findings CTA NECK FINDINGS Aortic arch: There is a common origin of the innominate and left common carotid artery. Mild atherosclerotic changes are present. There is no significant stenosis the great vessel origins Right carotid system: The right common carotid artery is within normal limits proximally. Eccentric atherosclerotic changes are present in the distal right common carotid artery. Lumen is narrowed to 3.5 mm. There is no stenosis relative to the more distal vessel. Atherosclerotic calcifications are present at the carotid bifurcation without significant stenosis in the cervical ICA. Left carotid system: Left carotid artery is tortuous proximally. Atherosclerotic changes are present at the left carotid bifurcation. No significant stenosis relative the distal vessel. The cervical left ICA is normal. Vertebral arteries: The vertebral arteries are codominant. Both vertebral arteries originate from the subclavian arteries without significant stenosis. No significant injury or stenosis to either vertebral artery in the neck. Skeleton: Multilevel degenerative changes are present in the cervical spine. Grade 1 anterolisthesis is present  at C3-4. There is chronic loss disc height with endplate uncovertebral disease at C4-5, C5-6, and C6-7. Osseous foraminal narrowing is greater right than left at C4-5 and C5-6. Other neck: The soft tissues of the neck are otherwise unremarkable. Salivary glands are within normal limits. No significant adenopathy is present. Thyroid is heterogeneous without a dominant lesion. No focal mucosal lesions are present. Upper chest: Paraseptal emphysematous changes are present along a disease. There is no focal nodule or mass lesion. Effusion is present. There is no pneumothorax. The thoracic inlet is within normal limits. Review of the MIP images confirms the above findings CTA HEAD FINDINGS Anterior circulation: Atherosclerotic calcifications are present in the cavernous internal carotid arteries bilaterally. There is no significant stenosis from the skull base to the ICA termini. The A1 and M1 segments are normal. The anterior communicating artery is  patent. MCA bifurcations are within normal limits. There is moderate attenuation of distal ACA and MCA branch vessels bilaterally without a significant proximal stenosis or occlusion Posterior circulation: The vertebral arteries are codominant. Atherosclerotic changes are present at the dural margin of the left vertebral artery without significant stenosis. Vertebrobasilar junction is normal. Both posterior cerebral arteries originate from the basilar tip. There is some attenuation of distal PCA branch vessels bilaterally without a significant proximal stenosis or occlusion. Venous sinuses: The dural sinuses are patent. Straight sinus and deep cerebral veins are intact. Cortical veins are within normal limits. Anatomic variants: None Delayed phase: The postcontrast images demonstrate no pathologic enhancement. Review of the MIP images confirms the above findings IMPRESSION: 1. Atherosclerotic changes within the mid and distal right common carotid artery narrows the lumen to  3.5 mm without a significant stenosis relative to the more distal vessel. 2. Atherosclerotic changes bilaterally at the carotid bifurcations without significant stenoses. 3. Atherosclerotic changes at the dural margin of the left vertebral artery without a significant stenosis. 4. Atherosclerotic changes of the cavernous internal carotid arteries bilaterally without significant stenosis. 5. Distal medium and small vessel disease is present throughout the anterior and posterior circulation without a significant proximal stenosis, aneurysm, or branch vessel occlusion otherwise. 6. Multilevel degenerative changes of the cervical spine as described above. Grade 1 anterolisthesis is present at C3-4. Electronically Signed: By: San Morelle M.D. On: 04/25/2018 14:58   Dg Chest 2 View  Result Date: 04/25/2018 CLINICAL DATA:  Elevated troponin.  Weakness. EXAM: CHEST - 2 VIEW COMPARISON:  April 18, 2018. FINDINGS: The heart size and mediastinal contours are within normal limits. Both lungs are clear. The visualized skeletal structures are stable. Spinal stimulator lead is unchanged. IMPRESSION: No active cardiopulmonary disease. Electronically Signed   By: Abelardo Diesel M.D.   On: 04/25/2018 14:38   Ct Head Wo Contrast  Result Date: 04/26/2018 CLINICAL DATA:  72 y/o M; stroke for follow-up. Left-sided weakness. EXAM: CT HEAD WITHOUT CONTRAST TECHNIQUE: Contiguous axial images were obtained from the base of the skull through the vertex without intravenous contrast. COMPARISON:  04/25/2018 CT head, CTA head, CTA neck. FINDINGS: Brain: Multiple small foci of cortical hypoattenuation within the right posterior occipital and parietal lobes are increased in conspicuity when compared with the prior CT of the head. No hemorrhage or mass effect. Streak artifact from dental hardware largely obscures the posterior fossa. No extra-axial collection, hydrocephalus, effacement of basilar cisterns, or herniation. Vascular:  No hyperdense vessel or unexpected calcification. Skull: Normal. Negative for fracture or focal lesion. Sinuses/Orbits: No acute finding. Other: None. IMPRESSION: Increased conspicuity of multiple small cortical infarctions in the right posterior occipital and parietal lobes. No new stroke, hemorrhage, or mass effect identified. Electronically Signed   By: Kristine Garbe M.D.   On: 04/26/2018 01:53   Ct Angio Neck W And/or Wo Contrast  Addendum Date: 04/25/2018   ADDENDUM REPORT: 04/25/2018 19:41 ADDENDUM: Cortical hypoattenuation along the right pre and postcentral gyrus is consistent with acute/subacute nonhemorrhagic infarct there is effacement adjacent sulci. Associated subacute arachnoid hemorrhage is considered less likely. This is likely all related to a subacute ischemic event. Findings were discussed with Dr. Lorraine Lax at 7:40 p.m. Electronically Signed   By: San Morelle M.D.   On: 04/25/2018 19:41   Result Date: 04/25/2018 CLINICAL DATA:  Left-sided weakness. Walking for 2 days. Cerebral aneurysm subarachnoid hemorrhage, cerebral vasospasm evaluation. EXAM: CT ANGIOGRAPHY HEAD AND NECK TECHNIQUE: Multidetector CT imaging of the head and  neck was performed using the standard protocol during bolus administration of intravenous contrast. Multiplanar CT image reconstructions and MIPs were obtained to evaluate the vascular anatomy. Carotid stenosis measurements (when applicable) are obtained utilizing NASCET criteria, using the distal internal carotid diameter as the denominator. CONTRAST:  38mL ISOVUE-370 IOPAMIDOL (ISOVUE-370) INJECTION 76% COMPARISON:  Head without contrast 06/23/2017 FINDINGS: CT HEAD FINDINGS Brain: [name] matter hypoattenuation is present along the right pre and postcentral gyrus. No hemorrhage is present. There is no mass lesion. Ventricles proportionate to the degree of atrophy. Moderate diffuse white matter hypoattenuation is present bilaterally. Brainstem and  cerebellum are otherwise normal. Vascular: Atherosclerotic calcifications are present within the cavernous internal carotid arteries bilaterally. There is hyperdense vessel. Skull: Calvarium is intact. No focal lytic or blastic lesions are present. Sinuses: The paranasal sinuses and mastoid air cells are clear. Orbits: Globes and orbits are within bilaterally. Review of the MIP images confirms the above findings CTA NECK FINDINGS Aortic arch: There is a common origin of the innominate and left common carotid artery. Mild atherosclerotic changes are present. There is no significant stenosis the great vessel origins Right carotid system: The right common carotid artery is within normal limits proximally. Eccentric atherosclerotic changes are present in the distal right common carotid artery. Lumen is narrowed to 3.5 mm. There is no stenosis relative to the more distal vessel. Atherosclerotic calcifications are present at the carotid bifurcation without significant stenosis in the cervical ICA. Left carotid system: Left carotid artery is tortuous proximally. Atherosclerotic changes are present at the left carotid bifurcation. No significant stenosis relative the distal vessel. The cervical left ICA is normal. Vertebral arteries: The vertebral arteries are codominant. Both vertebral arteries originate from the subclavian arteries without significant stenosis. No significant injury or stenosis to either vertebral artery in the neck. Skeleton: Multilevel degenerative changes are present in the cervical spine. Grade 1 anterolisthesis is present at C3-4. There is chronic loss disc height with endplate uncovertebral disease at C4-5, C5-6, and C6-7. Osseous foraminal narrowing is greater right than left at C4-5 and C5-6. Other neck: The soft tissues of the neck are otherwise unremarkable. Salivary glands are within normal limits. No significant adenopathy is present. Thyroid is heterogeneous without a dominant lesion. No  focal mucosal lesions are present. Upper chest: Paraseptal emphysematous changes are present along a disease. There is no focal nodule or mass lesion. Effusion is present. There is no pneumothorax. The thoracic inlet is within normal limits. Review of the MIP images confirms the above findings CTA HEAD FINDINGS Anterior circulation: Atherosclerotic calcifications are present in the cavernous internal carotid arteries bilaterally. There is no significant stenosis from the skull base to the ICA termini. The A1 and M1 segments are normal. The anterior communicating artery is patent. MCA bifurcations are within normal limits. There is moderate attenuation of distal ACA and MCA branch vessels bilaterally without a significant proximal stenosis or occlusion Posterior circulation: The vertebral arteries are codominant. Atherosclerotic changes are present at the dural margin of the left vertebral artery without significant stenosis. Vertebrobasilar junction is normal. Both posterior cerebral arteries originate from the basilar tip. There is some attenuation of distal PCA branch vessels bilaterally without a significant proximal stenosis or occlusion. Venous sinuses: The dural sinuses are patent. Straight sinus and deep cerebral veins are intact. Cortical veins are within normal limits. Anatomic variants: None Delayed phase: The postcontrast images demonstrate no pathologic enhancement. Review of the MIP images confirms the above findings IMPRESSION: 1. Atherosclerotic changes within the mid  and distal right common carotid artery narrows the lumen to 3.5 mm without a significant stenosis relative to the more distal vessel. 2. Atherosclerotic changes bilaterally at the carotid bifurcations without significant stenoses. 3. Atherosclerotic changes at the dural margin of the left vertebral artery without a significant stenosis. 4. Atherosclerotic changes of the cavernous internal carotid arteries bilaterally without significant  stenosis. 5. Distal medium and small vessel disease is present throughout the anterior and posterior circulation without a significant proximal stenosis, aneurysm, or branch vessel occlusion otherwise. 6. Multilevel degenerative changes of the cervical spine as described above. Grade 1 anterolisthesis is present at C3-4. Electronically Signed: By: San Morelle M.D. On: 04/25/2018 14:58    Cardiac Studies   Echo 04/26/2018 ------------------------------------------------------------------- LV EF: 60% -   65% Study Conclusions  - Left ventricle: The cavity size was normal. Wall thickness was   increased in a pattern of mild LVH. Systolic function was normal.   The estimated ejection fraction was in the range of 60% to 65%  Patient Profile     72 y.o. male with PMH of HTN and chronic back pain with TENS unit who was recently admitted with L arm and leg weakness. Evaluated by neurology and felt to have R MCA frontal and occipital infarcts with embolic pattern. CTA of neck showed ulcerative large soft plaque in the R common carotid artery. Cardiology consulted for chest pain and elevated trop (2.21-->3.09-->2.83). He also had an episode of NSVT and placed on IV amiodarone.  Assessment & Plan    1. R MCA stroke  - followed by neurology, unclear if cardioembolic vs ulcerative large soft plaque in the R CCA.   - continue to L sided weakness  - neurology recommended TEE +/- loop.   - currently on high dose aspirin and plavix, plan to transition to plavix monotherapy in 3 month.   2. Chest pain with elevated trop  - 2.21-->3.09-->2.83. Although elevation of troponin can occur with CVA, however patient has been complaining of years of chest pain with exertion. Last episode of chest pain several days ago. Previous CT of chest in Sept 2016 showed calcification in the LAD and LCx territory. Will need ischemic workup at some point once stable from neurology perspective.  - Echo showed normal  EF.  - agree with neurology regarding DAPT for now.   3. NSVT: short course of IV amiodarone. Now amiodarone stopped given lack of recurrence, unable to add low dose BB due to baseline bradycardia. HR in high 40s at night.        For questions or updates, please contact [place] Please consult [email or web address] for contact info under Cardiology/STEMI.      Hilbert Corrigan, PA  04/26/2018, 2:33 PM     I have seen and examined the patient along with Almyra Deforest, PA.  I have reviewed the chart, notes and new data.  I agree with PA's note.  Key new complaints: Reports that he had chest pain for "a couple of weeks" before this admission, unclear whether this was exertional.  Denies having palpitations. Key examination changes: Left hemiparesis, regular rate and rhythm, normal cardiovascular exam. Key new findings / data: ECGs on this admission show a subtle nonspecific ST segment elevation pattern primarily in the inferior leads and in V5-V6.  The EKGs from August 12 show a remarkably prominent R waves in leads V1 and V2, quite distinct from the pattern on April 26, 2019 R waves were very small.  Troponin peaked at  3.09.  PLAN: He clearly has had right MCA distribution stroke with left hemiparesis and treatment of this takes priority.  Plan transesophageal echo and if necessary implantable loop recorder tomorrow.  These procedures were discussed in detail with the patient, including risks and benefits of the procedures.  He agrees to go ahead with them.  It also appears that he had a posterior myocardial infarction, just before admission. Since he is already receiving aspirin, clopidogrel and a statin, has normal left ventricular systolic function and is no longer having angina, evaluation and treatment of his coronary situation is not urgent. I would suggest risk stratification with a nuclear perfusion study in a few weeks, coronary angiography if there is significant residual  ischemia.  Sanda Klein, MD, [place] [phone] 04/26/2018, 3:24 PM

## 2018-04-26 NOTE — Evaluation (Signed)
Occupational Therapy Evaluation Patient Details Name: William Jordan MRN: [id number] DOB: Dec 17, 1945 Today's Date: 04/26/2018    History of Present Illness This is a 72 year old male with a PMH of HTN, chronic back pain (under pain management, opoid abuse), idiopathic peripheral neuropathy (with nerve stimulator),and recent diagnosis of CHF; who presented with blurred vision, left arm and leg weakness, also had an episode of chest pain in the ER. CT found Increased conspicuity of multiple small cortical infarctions in the right posterior occipital and parietal lobes.   Clinical Impression   PTA Pt independent in ADL and IADL. Pt was driving and "loved to cook" Pt is currently mod A +2 for in room mobility, min A for UB ADL and mod A for LB ADL due to left sided deficits, decreased balance, decreased safety awareness, and decreased cognition. Pt from home with wife available for 24 hour care and once in house all bathrooms are 100% handicap accessible. Pt will benefit from skilled OT in the acute setting and afterwards at the CIR level to maximize safety and independence in ADL and functional transfers. Next session to focus on HEP for LUE and practice seated BUE tasks for grooming.    Follow Up Recommendations  CIR;Supervision/Assistance - 24 hour    Equipment Recommendations  Other (comment)(defer to next venue)    Recommendations for Other Services Rehab consult     Precautions / Restrictions Precautions Precautions: Fall Restrictions Weight Bearing Restrictions: No      Mobility Bed Mobility Overal bed mobility: Needs Assistance Bed Mobility: Sit to Supine       Sit to supine: Min assist;HOB elevated      Transfers Overall transfer level: Needs assistance Equipment used: 2 person hand held assist Transfers: Sit to/from Stand Sit to Stand: Mod assist;+2 physical assistance;+2 safety/equipment         General transfer comment: min A for boost, mod A +2 once in standing  with L weak side buckling.     Balance Overall balance assessment: Needs assistance Sitting-balance support: No upper extremity supported;Feet supported Sitting balance-Leahy Scale: Fair     Standing balance support: Bilateral upper extremity supported Standing balance-Leahy Scale: Poor Standing balance comment: dependent on external support from therapy team                           ADL either performed or assessed with clinical judgement   ADL Overall ADL's : Needs assistance/impaired Eating/Feeding: Set up;Sitting Eating/Feeding Details (indicate cue type and reason): required assist for opening containers Grooming: Minimal assistance;Sitting;Wash/dry hands;Wash/dry face Grooming Details (indicate cue type and reason): min A for tasks that require BUE assist Upper Body Bathing: Minimal assistance   Lower Body Bathing: Moderate assistance   Upper Body Dressing : Minimal assistance;Sitting   Lower Body Dressing: Moderate assistance;Sit to/from stand Lower Body Dressing Details (indicate cue type and reason): required assist for donning socks, able to pull up after OT initiated Toilet Transfer: Moderate assistance;+2 for physical assistance;+2 for safety/equipment;Ambulation Toilet Transfer Details (indicate cue type and reason): 2 person HHA for short ambulation Toileting- Clothing Manipulation and Hygiene: Moderate assistance;Sit to/from stand       Functional mobility during ADLs: Moderate assistance;+2 for physical assistance;+2 for safety/equipment(2 person HHA) General ADL Comments: LUE decrease in function impacting performance as well as      Vision Baseline Vision/History: Wears glasses Wears Glasses: At all times Patient Visual Report: Blurring of vision Vision Assessment?: Vision impaired- to  be further tested in functional context Additional Comments: "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"     Perception     Praxis      Pertinent Vitals/Pain  Pain Assessment: Faces Faces Pain Scale: Hurts little more Pain Location: Bilateral Feet, R shoulder Pain Descriptors / Indicators: Constant;Burning;Discomfort Pain Intervention(s): Limited activity within patient's tolerance;Monitored during session;Repositioned     Hand Dominance Right   Extremity/Trunk Assessment Upper Extremity Assessment Upper Extremity Assessment: LUE deficits/detail;Generalized weakness;RUE deficits/detail RUE Deficits / Details: pain in R shoulder limiting ROM to approx 90 degrees FF RUE: Unable to fully assess due to pain LUE Deficits / Details: grasp grossly 3/5, decreased sensation, biceps 4/5, triceps 4/5, decreased coordination for fine and gross motor, shoulder FF requires increased time and concentration LUE Sensation: decreased light touch LUE Coordination: decreased gross motor;decreased fine motor   Lower Extremity Assessment Lower Extremity Assessment: Defer to PT evaluation       Communication Communication Communication: No difficulties   Cognition Arousal/Alertness: Awake/alert Behavior During Therapy: WFL for tasks assessed/performed Overall Cognitive Status: No family/caregiver present to determine baseline cognitive functioning Area of Impairment: Attention;Following commands;Problem solving                   Current Attention Level: Sustained   Following Commands: Follows one step commands with increased time     Problem Solving: Difficulty sequencing;Requires verbal cues;Requires tactile cues General Comments: At one point Pt meowed during session "I just like cats"   General Comments       Exercises     Shoulder Instructions      Home Living Family/patient expects to be discharged to:: Private residence Living Arrangements: Spouse/significant other Available Help at Discharge: Family;Available 24 hours/day Type of Home: House Home Access: Stairs to enter [employer] of Steps: 6 Entrance Stairs-Rails:  Right;Can reach both;Left Home Layout: One level     Bathroom Shower/Tub: Occupational psychologist: Handicapped height Bathroom Accessibility: Yes How Accessible: Accessible via wheelchair;Accessible via walker Home Equipment: Walker - 2 wheels;Hand held shower head;Grab bars - tub/shower;Shower seat - built in;Cane - single point;Walker - 4 wheels          Prior Functioning/Environment Level of Independence: Independent        Comments: drives, cooks        OT Problem List: Decreased range of motion;Decreased strength;Decreased activity tolerance;Impaired balance (sitting and/or standing);Impaired vision/perception;Decreased coordination;Decreased cognition;Decreased safety awareness;Decreased knowledge of use of DME or AE;Impaired sensation;Impaired UE functional use      OT Treatment/Interventions: Self-care/ADL training;Therapeutic exercise;DME and/or AE instruction;Neuromuscular education;Therapeutic activities;Visual/perceptual remediation/compensation;Patient/family education;Balance training    OT Goals(Current goals can be found in the care plan section) Acute Rehab OT Goals Patient Stated Goal: "get independent" OT Goal Formulation: With patient Time For Goal Achievement: 05/10/18 Potential to Achieve Goals: Good ADL Goals Pt Will Perform Grooming: with modified independence;sitting Pt Will Perform Upper Body Dressing: with modified independence;sitting Pt Will Perform Lower Body Dressing: with supervision;sit to/from stand Pt Will Transfer to Toilet: with supervision;ambulating Pt Will Perform Toileting - Clothing Manipulation and hygiene: with supervision;sit to/from stand Pt/caregiver will Perform Home Exercise Program: Left upper extremity;With written HEP provided;Independently Additional ADL Goal #1: Pt will perform bed mobility at modified independent level prior to engaging in ADL activity  OT Frequency: Min 3X/week   Barriers to D/C:             Co-evaluation  AM-PAC PT "6 Clicks" Daily Activity     Outcome Measure Help from another person eating meals?: A Little Help from another person taking care of personal grooming?: A Little Help from another person toileting, which includes using toliet, bedpan, or urinal?: A Lot Help from another person bathing (including washing, rinsing, drying)?: A Lot Help from another person to put on and taking off regular upper body clothing?: A Lot Help from another person to put on and taking off regular lower body clothing?: A Lot 6 Click Score: 14   End of Session Equipment Utilized During Treatment: Gait belt Nurse Communication: Mobility status  Activity Tolerance: Patient tolerated treatment well Patient left: in chair;with call bell/phone within reach;with chair alarm set  OT Visit Diagnosis: Unsteadiness on feet (R26.81);Other abnormalities of gait and mobility (R26.89);Muscle weakness (generalized) (M62.81);Other symptoms and signs involving cognitive function;Hemiplegia and hemiparesis Hemiplegia - Right/Left: Left Hemiplegia - dominant/non-dominant: Non-Dominant Hemiplegia - caused by: Cerebral infarction                Time: 1131-1154 OT Time Calculation (min): 23 min Charges:  OT General Charges $OT Visit: 1 Visit OT Evaluation $OT Eval Moderate Complexity: 1 Mod  Hulda Humphrey OTR/L [place] 04/26/2018, 1:26 PM

## 2018-04-26 NOTE — H&P (View-Only) (Signed)
Progress Note  Patient Name: William Jordan Date of Encounter: 04/26/2018  Primary Cardiologist: No primary care provider on file.   Subjective   Denies any chest pain for the past few days, has been having exertional chest pain for "a long time". Continue to have L sided weakness  Inpatient Medications    Scheduled Meds: . aspirin  325 mg Oral Daily  . atorvastatin  40 mg Oral q1800  . clopidogrel  300 mg Oral Once  . clopidogrel  75 mg Oral Daily  . enoxaparin (LOVENOX) injection  40 mg Subcutaneous Q24H  . gabapentin  300 mg Oral TID  . pantoprazole  40 mg Oral Daily   Continuous Infusions: . sodium chloride 10 mL/hr at 04/26/18 1123  . amiodarone Stopped (04/26/18 1359)   PRN Meds: sodium chloride, oxyCODONE   Vital Signs    Vitals:   04/26/18 0435 04/26/18 0634 04/26/18 0801 04/26/18 1256  BP: 108/69  124/76 116/71  Pulse: (!) 49 60 (!) 58 71  Resp: 16 17 17 18   Temp: 98 F (36.7 C) 98.3 F (36.8 C) 97.8 F (36.6 C) 98 F (36.7 C)  TempSrc: Axillary Axillary Oral Oral  SpO2: 96%  98% 99%  Weight:      Height:        Intake/Output Summary (Last 24 hours) at 04/26/2018 1433 Last data filed at 04/26/2018 1110 Gross per 24 hour  Intake 498.21 ml  Output 2120 ml  Net -1621.79 ml   Filed Weights   04/25/18 1219  Weight: 83.9 kg    Telemetry    Sinus bradycardia without significant ventricular ectopy - Personally Reviewed  ECG    NSR - Personally Reviewed  Physical Exam   GEN: No acute distress.   Neck: No JVD Cardiac: RRR, no murmurs, rubs, or gallops.  Respiratory: Clear to auscultation bilaterally. GI: Soft, nontender, non-distended  MS: No edema; L sided weakness Neuro:  Nonfocal  Psych: Normal affect   Labs    Chemistry Recent Labs  Lab 04/25/18 1243 04/25/18 1305 04/26/18 0355  NA 128* 127* 134*  K 5.1 5.0 4.8  CL 93* 92* 95*  CO2 25  --  28  GLUCOSE 98 103* 89  BUN 30* 36* 19  CREATININE 1.37* 1.30* 1.02  CALCIUM 8.6*  --   8.8*  PROT 6.3*  --   --   ALBUMIN 3.5  --   --   AST 21  --   --   ALT 17  --   --   ALKPHOS 74  --   --   BILITOT 0.5  --   --   GFRNONAA 51*  --  >60  GFRAA 59*  --  >60  ANIONGAP 10  --  11     Hematology Recent Labs  Lab 04/25/18 1243 04/25/18 1305 04/26/18 0355  WBC 21.2*  --  16.0*  RBC 3.90*  --  4.01*  HGB 11.1* 11.9* 11.6*  HCT 35.2* 35.0* 35.7*  MCV 90.3  --  89.0  MCH 28.5  --  28.9  MCHC 31.5  --  32.5  RDW 14.5  --  14.5  PLT 330  --  300    Cardiac Enzymes Recent Labs  Lab 04/25/18 1854 04/26/18 0118 04/26/18 0355  TROPONINI 2.21* 3.09* 2.83*    Recent Labs  Lab 04/25/18 1303  TROPIPOC 0.22*     BNPNo results for input(s): BNP, PROBNP in the last 168 hours.   DDimer No results  for input(s): DDIMER in the last 168 hours.   Radiology    Ct Angio Head W Or Wo Contrast  Addendum Date: 04/25/2018   ADDENDUM REPORT: 04/25/2018 19:41 ADDENDUM: Cortical hypoattenuation along the right pre and postcentral gyrus is consistent with acute/subacute nonhemorrhagic infarct there is effacement adjacent sulci. Associated subacute arachnoid hemorrhage is considered less likely. This is likely all related to a subacute ischemic event. Findings were discussed with Dr. Lorraine Lax at 7:40 p.m. Electronically Signed   By: San Morelle M.D.   On: 04/25/2018 19:41   Result Date: 04/25/2018 CLINICAL DATA:  Left-sided weakness. Walking for 2 days. Cerebral aneurysm subarachnoid hemorrhage, cerebral vasospasm evaluation. EXAM: CT ANGIOGRAPHY HEAD AND NECK TECHNIQUE: Multidetector CT imaging of the head and neck was performed using the standard protocol during bolus administration of intravenous contrast. Multiplanar CT image reconstructions and MIPs were obtained to evaluate the vascular anatomy. Carotid stenosis measurements (when applicable) are obtained utilizing NASCET criteria, using the distal internal carotid diameter as the denominator. CONTRAST:  68mL ISOVUE-370  IOPAMIDOL (ISOVUE-370) INJECTION 76% COMPARISON:  Head without contrast 06/23/2017 FINDINGS: CT HEAD FINDINGS Brain: [name] matter hypoattenuation is present along the right pre and postcentral gyrus. No hemorrhage is present. There is no mass lesion. Ventricles proportionate to the degree of atrophy. Moderate diffuse white matter hypoattenuation is present bilaterally. Brainstem and cerebellum are otherwise normal. Vascular: Atherosclerotic calcifications are present within the cavernous internal carotid arteries bilaterally. There is hyperdense vessel. Skull: Calvarium is intact. No focal lytic or blastic lesions are present. Sinuses: The paranasal sinuses and mastoid air cells are clear. Orbits: Globes and orbits are within bilaterally. Review of the MIP images confirms the above findings CTA NECK FINDINGS Aortic arch: There is a common origin of the innominate and left common carotid artery. Mild atherosclerotic changes are present. There is no significant stenosis the great vessel origins Right carotid system: The right common carotid artery is within normal limits proximally. Eccentric atherosclerotic changes are present in the distal right common carotid artery. Lumen is narrowed to 3.5 mm. There is no stenosis relative to the more distal vessel. Atherosclerotic calcifications are present at the carotid bifurcation without significant stenosis in the cervical ICA. Left carotid system: Left carotid artery is tortuous proximally. Atherosclerotic changes are present at the left carotid bifurcation. No significant stenosis relative the distal vessel. The cervical left ICA is normal. Vertebral arteries: The vertebral arteries are codominant. Both vertebral arteries originate from the subclavian arteries without significant stenosis. No significant injury or stenosis to either vertebral artery in the neck. Skeleton: Multilevel degenerative changes are present in the cervical spine. Grade 1 anterolisthesis is present  at C3-4. There is chronic loss disc height with endplate uncovertebral disease at C4-5, C5-6, and C6-7. Osseous foraminal narrowing is greater right than left at C4-5 and C5-6. Other neck: The soft tissues of the neck are otherwise unremarkable. Salivary glands are within normal limits. No significant adenopathy is present. Thyroid is heterogeneous without a dominant lesion. No focal mucosal lesions are present. Upper chest: Paraseptal emphysematous changes are present along a disease. There is no focal nodule or mass lesion. Effusion is present. There is no pneumothorax. The thoracic inlet is within normal limits. Review of the MIP images confirms the above findings CTA HEAD FINDINGS Anterior circulation: Atherosclerotic calcifications are present in the cavernous internal carotid arteries bilaterally. There is no significant stenosis from the skull base to the ICA termini. The A1 and M1 segments are normal. The anterior communicating artery is  patent. MCA bifurcations are within normal limits. There is moderate attenuation of distal ACA and MCA branch vessels bilaterally without a significant proximal stenosis or occlusion Posterior circulation: The vertebral arteries are codominant. Atherosclerotic changes are present at the dural margin of the left vertebral artery without significant stenosis. Vertebrobasilar junction is normal. Both posterior cerebral arteries originate from the basilar tip. There is some attenuation of distal PCA branch vessels bilaterally without a significant proximal stenosis or occlusion. Venous sinuses: The dural sinuses are patent. Straight sinus and deep cerebral veins are intact. Cortical veins are within normal limits. Anatomic variants: None Delayed phase: The postcontrast images demonstrate no pathologic enhancement. Review of the MIP images confirms the above findings IMPRESSION: 1. Atherosclerotic changes within the mid and distal right common carotid artery narrows the lumen to  3.5 mm without a significant stenosis relative to the more distal vessel. 2. Atherosclerotic changes bilaterally at the carotid bifurcations without significant stenoses. 3. Atherosclerotic changes at the dural margin of the left vertebral artery without a significant stenosis. 4. Atherosclerotic changes of the cavernous internal carotid arteries bilaterally without significant stenosis. 5. Distal medium and small vessel disease is present throughout the anterior and posterior circulation without a significant proximal stenosis, aneurysm, or branch vessel occlusion otherwise. 6. Multilevel degenerative changes of the cervical spine as described above. Grade 1 anterolisthesis is present at C3-4. Electronically Signed: By: San Morelle M.D. On: 04/25/2018 14:58   Dg Chest 2 View  Result Date: 04/25/2018 CLINICAL DATA:  Elevated troponin.  Weakness. EXAM: CHEST - 2 VIEW COMPARISON:  April 18, 2018. FINDINGS: The heart size and mediastinal contours are within normal limits. Both lungs are clear. The visualized skeletal structures are stable. Spinal stimulator lead is unchanged. IMPRESSION: No active cardiopulmonary disease. Electronically Signed   By: Abelardo Diesel M.D.   On: 04/25/2018 14:38   Ct Head Wo Contrast  Result Date: 04/26/2018 CLINICAL DATA:  72 y/o M; stroke for follow-up. Left-sided weakness. EXAM: CT HEAD WITHOUT CONTRAST TECHNIQUE: Contiguous axial images were obtained from the base of the skull through the vertex without intravenous contrast. COMPARISON:  04/25/2018 CT head, CTA head, CTA neck. FINDINGS: Brain: Multiple small foci of cortical hypoattenuation within the right posterior occipital and parietal lobes are increased in conspicuity when compared with the prior CT of the head. No hemorrhage or mass effect. Streak artifact from dental hardware largely obscures the posterior fossa. No extra-axial collection, hydrocephalus, effacement of basilar cisterns, or herniation. Vascular:  No hyperdense vessel or unexpected calcification. Skull: Normal. Negative for fracture or focal lesion. Sinuses/Orbits: No acute finding. Other: None. IMPRESSION: Increased conspicuity of multiple small cortical infarctions in the right posterior occipital and parietal lobes. No new stroke, hemorrhage, or mass effect identified. Electronically Signed   By: Kristine Garbe M.D.   On: 04/26/2018 01:53   Ct Angio Neck W And/or Wo Contrast  Addendum Date: 04/25/2018   ADDENDUM REPORT: 04/25/2018 19:41 ADDENDUM: Cortical hypoattenuation along the right pre and postcentral gyrus is consistent with acute/subacute nonhemorrhagic infarct there is effacement adjacent sulci. Associated subacute arachnoid hemorrhage is considered less likely. This is likely all related to a subacute ischemic event. Findings were discussed with Dr. Lorraine Lax at 7:40 p.m. Electronically Signed   By: San Morelle M.D.   On: 04/25/2018 19:41   Result Date: 04/25/2018 CLINICAL DATA:  Left-sided weakness. Walking for 2 days. Cerebral aneurysm subarachnoid hemorrhage, cerebral vasospasm evaluation. EXAM: CT ANGIOGRAPHY HEAD AND NECK TECHNIQUE: Multidetector CT imaging of the head and  neck was performed using the standard protocol during bolus administration of intravenous contrast. Multiplanar CT image reconstructions and MIPs were obtained to evaluate the vascular anatomy. Carotid stenosis measurements (when applicable) are obtained utilizing NASCET criteria, using the distal internal carotid diameter as the denominator. CONTRAST:  51mL ISOVUE-370 IOPAMIDOL (ISOVUE-370) INJECTION 76% COMPARISON:  Head without contrast 06/23/2017 FINDINGS: CT HEAD FINDINGS Brain: [name] matter hypoattenuation is present along the right pre and postcentral gyrus. No hemorrhage is present. There is no mass lesion. Ventricles proportionate to the degree of atrophy. Moderate diffuse white matter hypoattenuation is present bilaterally. Brainstem and  cerebellum are otherwise normal. Vascular: Atherosclerotic calcifications are present within the cavernous internal carotid arteries bilaterally. There is hyperdense vessel. Skull: Calvarium is intact. No focal lytic or blastic lesions are present. Sinuses: The paranasal sinuses and mastoid air cells are clear. Orbits: Globes and orbits are within bilaterally. Review of the MIP images confirms the above findings CTA NECK FINDINGS Aortic arch: There is a common origin of the innominate and left common carotid artery. Mild atherosclerotic changes are present. There is no significant stenosis the great vessel origins Right carotid system: The right common carotid artery is within normal limits proximally. Eccentric atherosclerotic changes are present in the distal right common carotid artery. Lumen is narrowed to 3.5 mm. There is no stenosis relative to the more distal vessel. Atherosclerotic calcifications are present at the carotid bifurcation without significant stenosis in the cervical ICA. Left carotid system: Left carotid artery is tortuous proximally. Atherosclerotic changes are present at the left carotid bifurcation. No significant stenosis relative the distal vessel. The cervical left ICA is normal. Vertebral arteries: The vertebral arteries are codominant. Both vertebral arteries originate from the subclavian arteries without significant stenosis. No significant injury or stenosis to either vertebral artery in the neck. Skeleton: Multilevel degenerative changes are present in the cervical spine. Grade 1 anterolisthesis is present at C3-4. There is chronic loss disc height with endplate uncovertebral disease at C4-5, C5-6, and C6-7. Osseous foraminal narrowing is greater right than left at C4-5 and C5-6. Other neck: The soft tissues of the neck are otherwise unremarkable. Salivary glands are within normal limits. No significant adenopathy is present. Thyroid is heterogeneous without a dominant lesion. No  focal mucosal lesions are present. Upper chest: Paraseptal emphysematous changes are present along a disease. There is no focal nodule or mass lesion. Effusion is present. There is no pneumothorax. The thoracic inlet is within normal limits. Review of the MIP images confirms the above findings CTA HEAD FINDINGS Anterior circulation: Atherosclerotic calcifications are present in the cavernous internal carotid arteries bilaterally. There is no significant stenosis from the skull base to the ICA termini. The A1 and M1 segments are normal. The anterior communicating artery is patent. MCA bifurcations are within normal limits. There is moderate attenuation of distal ACA and MCA branch vessels bilaterally without a significant proximal stenosis or occlusion Posterior circulation: The vertebral arteries are codominant. Atherosclerotic changes are present at the dural margin of the left vertebral artery without significant stenosis. Vertebrobasilar junction is normal. Both posterior cerebral arteries originate from the basilar tip. There is some attenuation of distal PCA branch vessels bilaterally without a significant proximal stenosis or occlusion. Venous sinuses: The dural sinuses are patent. Straight sinus and deep cerebral veins are intact. Cortical veins are within normal limits. Anatomic variants: None Delayed phase: The postcontrast images demonstrate no pathologic enhancement. Review of the MIP images confirms the above findings IMPRESSION: 1. Atherosclerotic changes within the mid  and distal right common carotid artery narrows the lumen to 3.5 mm without a significant stenosis relative to the more distal vessel. 2. Atherosclerotic changes bilaterally at the carotid bifurcations without significant stenoses. 3. Atherosclerotic changes at the dural margin of the left vertebral artery without a significant stenosis. 4. Atherosclerotic changes of the cavernous internal carotid arteries bilaterally without significant  stenosis. 5. Distal medium and small vessel disease is present throughout the anterior and posterior circulation without a significant proximal stenosis, aneurysm, or branch vessel occlusion otherwise. 6. Multilevel degenerative changes of the cervical spine as described above. Grade 1 anterolisthesis is present at C3-4. Electronically Signed: By: San Morelle M.D. On: 04/25/2018 14:58    Cardiac Studies   Echo 04/26/2018 ------------------------------------------------------------------- LV EF: 60% -   65% Study Conclusions  - Left ventricle: The cavity size was normal. Wall thickness was   increased in a pattern of mild LVH. Systolic function was normal.   The estimated ejection fraction was in the range of 60% to 65%  Patient Profile     72 y.o. male with PMH of HTN and chronic back pain with TENS unit who was recently admitted with L arm and leg weakness. Evaluated by neurology and felt to have R MCA frontal and occipital infarcts with embolic pattern. CTA of neck showed ulcerative large soft plaque in the R common carotid artery. Cardiology consulted for chest pain and elevated trop (2.21-->3.09-->2.83). He also had an episode of NSVT and placed on IV amiodarone.  Assessment & Plan    1. R MCA stroke  - followed by neurology, unclear if cardioembolic vs ulcerative large soft plaque in the R CCA.   - continue to L sided weakness  - neurology recommended TEE +/- loop.   - currently on high dose aspirin and plavix, plan to transition to plavix monotherapy in 3 month.   2. Chest pain with elevated trop  - 2.21-->3.09-->2.83. Although elevation of troponin can occur with CVA, however patient has been complaining of years of chest pain with exertion. Last episode of chest pain several days ago. Previous CT of chest in Sept 2016 showed calcification in the LAD and LCx territory. Will need ischemic workup at some point once stable from neurology perspective.  - Echo showed normal  EF.  - agree with neurology regarding DAPT for now.   3. NSVT: short course of IV amiodarone. Now amiodarone stopped given lack of recurrence, unable to add low dose BB due to baseline bradycardia. HR in high 40s at night.        For questions or updates, please contact [place] Please consult [email or web address] for contact info under Cardiology/STEMI.      Hilbert Corrigan, PA  04/26/2018, 2:33 PM     I have seen and examined the patient along with Almyra Deforest, PA.  I have reviewed the chart, notes and new data.  I agree with PA's note.  Key new complaints: Reports that he had chest pain for "a couple of weeks" before this admission, unclear whether this was exertional.  Denies having palpitations. Key examination changes: Left hemiparesis, regular rate and rhythm, normal cardiovascular exam. Key new findings / data: ECGs on this admission show a subtle nonspecific ST segment elevation pattern primarily in the inferior leads and in V5-V6.  The EKGs from August 12 show a remarkably prominent R waves in leads V1 and V2, quite distinct from the pattern on April 26, 2019 R waves were very small.  Troponin peaked at  3.09.  PLAN: He clearly has had right MCA distribution stroke with left hemiparesis and treatment of this takes priority.  Plan transesophageal echo and if necessary implantable loop recorder tomorrow.  These procedures were discussed in detail with the patient, including risks and benefits of the procedures.  He agrees to go ahead with them.  It also appears that he had a posterior myocardial infarction, just before admission. Since he is already receiving aspirin, clopidogrel and a statin, has normal left ventricular systolic function and is no longer having angina, evaluation and treatment of his coronary situation is not urgent. I would suggest risk stratification with a nuclear perfusion study in a few weeks, coronary angiography if there is significant residual  ischemia.  Sanda Klein, MD, [place] [phone] 04/26/2018, 3:24 PM

## 2018-04-26 NOTE — Progress Notes (Signed)
Bedside EEG completed, results pending. 

## 2018-04-26 NOTE — Consult Note (Signed)
Physical Medicine and Rehabilitation Consult Reason for Consult: Left-sided weakness Referring Physician: Family medicine   HPI: William Jordan is a 72 y.o. right handed male with history of hypertension, idiopathic peripheral neuropathy with spinal cord stimulator 2016 per Melina Schools and chronic back pain, angioedema, diastolic congestive heart failure.  Per chart review patient lives with spouse.  Independent and driving prior to admission.  One level home with 6 steps to entry.  Presented 04/25/2018 with left-sided weakness.  While in the emergency department he developed tonic-clonic jerking of his left arm.  CT of the head showed increase conspicuity of multiple small cortical infarcts in the right posterior occipital and parietal lobes.  MRI not completed due to nerve stimulator.  EEG negative for seizure.  Echocardiogram pending as well as lower extremity Dopplers pending.  TEE pending.  CT angiogram of head and neck showed atherosclerotic changes without significant stenosis.    Follow-up CT of the head 04/27/2018 and presently on aspirin and Plavix for CVA prophylaxis.  Subcutaneous Lovenox for DVT prophylaxis.  Therapy evaluation completed with recommendations of physical medicine rehab consult.   Review of Systems  Constitutional:       Denies fever or chills  Respiratory:       Denies cough  Cardiovascular:       No chest pain or palpitations.  Noted lower extremity swelling  Gastrointestinal:       Bouts of constipation  Genitourinary:       No dysuria hematuria.  Patient does report urgency  Musculoskeletal:       Positive back pain and myalgias  Neurological:       Focal weakness as well as tingling to the left side.  Psychiatric/Behavioral:       Positive anxiety  All other systems reviewed and are negative.  Past Medical History:  Diagnosis Date  . Angioedema    from Benazepril reaction  . Anxiety    takes Celexa  . Arthritis   . Chronic back pain   .  Enlarged prostate   . History of blood transfusion   . Hypertension   . Neuropathy    Past Surgical History:  Procedure Laterality Date  . COLONOSCOPY     one polyp removed - benign  . ELBOW SURGERY Right   . HERNIA REPAIR     umbilical  . JOINT REPLACEMENT Left    knee, x 2  . JOINT REPLACEMENT Right    hip  . JOINT REPLACEMENT Left    partial shoulder  . SPINAL CORD STIMULATOR INSERTION N/A 09/28/2014   Procedure: LUMBAR SPINAL CORD STIMULATOR INSERTION;  Surgeon: Melina Schools, MD;  Location: [facility];  Service: Orthopedics;  Laterality: N/A;  . TONSILLECTOMY    . VASECTOMY     Family History  Problem Relation Age of Onset  . Heart disease Father    Social History:  reports that he has been smoking e-cigarettes. He has quit using smokeless tobacco.  His smokeless tobacco use included snuff. He reports that he drinks alcohol. He reports that he does not use drugs. Allergies:  Allergies  Allergen Reactions  . Ace Inhibitors Other (See Comments)    Angioedema (throat swelling)  . Morphine Other (See Comments)    Causes confusion and "makes me crazy," per the patient  . Sulfa Antibiotics Other (See Comments)    Unknown reaction   Medications Prior to Admission  Medication Sig Dispense Refill  . alfuzosin (UROXATRAL) 10 MG 24 hr  tablet Take 10 mg by mouth daily after supper.    Marland Kitchen amLODipine (NORVASC) 10 MG tablet Take 1 tablet (10 mg total) by mouth daily. 30 tablet 11  . celecoxib (CELEBREX) 100 MG capsule Take 100 mg by mouth 2 (two) times daily.     . cetirizine (ZYRTEC) 10 MG tablet Take 10 mg by mouth 2 (two) times daily.    . cholecalciferol (VITAMIN D) 1000 units tablet Take 2,000 Units by mouth daily.    . citalopram (CELEXA) 20 MG tablet Take 20 mg by mouth daily.    . cloNIDine (CATAPRES) 0.2 MG tablet Take 0.2 mg by mouth 2 (two) times daily.    . colchicine 0.6 MG tablet Take 0.6 mg by mouth 3 (three) times daily as needed (for gout flares).     . docusate sodium  (COLACE) 100 MG capsule Take 2 capsules (200 mg total) by mouth at bedtime. (Patient taking differently: Take 100 mg by mouth at bedtime. ) 30 capsule 0  . gabapentin (NEURONTIN) 600 MG tablet Take 300 mg by mouth 3 (three) times daily.     Marland Kitchen omeprazole (PRILOSEC) 20 MG capsule Take 20 mg by mouth daily.     . Oxycodone HCl 20 MG TABS Take 1 tablet (20 mg total) by mouth every 8 (eight) hours as needed. (Patient taking differently: Take 20 mg by mouth every 4 (four) hours as needed (pain). ) 1 tablet 0  . Saw Palmetto, Serenoa repens, (SAW PALMETTO PO) Take 1 capsule by mouth daily.    Marland Kitchen morphine (MS CONTIN) 15 MG 12 hr tablet Take 1 tablet (15 mg total) by mouth every 12 (twelve) hours. (Patient not taking: Reported on 12/10/2017) 60 tablet 0  . polyethylene glycol (MIRALAX / GLYCOLAX) packet Take 17 g by mouth daily. (Patient not taking: Reported on 04/25/2018) 14 each 0  . vitamin B-12 (CYANOCOBALAMIN) 100 MCG tablet Take 1 tablet (100 mcg total) by mouth daily. (Patient not taking: Reported on 04/25/2018) 30 tablet 1    Home: [place] expects to be discharged to:: Private residence Living Arrangements: Spouse/significant other Available Help at Discharge: Family, Available 24 hours/day Type of Home: House Home Access: Stairs to enter [employer] of Steps: 6 Entrance Stairs-Rails: Right, Can reach both, Left Home Layout: One level Bathroom Shower/Tub: Multimedia programmer: Handicapped height Bathroom Accessibility: Yes Home Equipment: Environmental consultant - 2 wheels, Hand held shower head, Grab bars - tub/shower, Shower seat - built in, [place] - single point, Environmental consultant - 4 wheels  Functional History: Prior Function Level of Independence: Independent Comments: drives, cooks Functional Status:  Mobility: Bed Mobility Overal bed mobility: Needs Assistance Bed Mobility: Supine to Sit Supine to sit: Min assist, HOB elevated General bed mobility comments: increased  time and effort to perform, patient with impulsivity, required cues to direct to task Transfers Overall transfer level: Needs assistance Equipment used: 2 person hand held assist Transfers: Sit to/from Stand Sit to Stand: Mod assist, +2 physical assistance, +2 safety/equipment General transfer comment: min A for boost, mod A +2 once in standing with L weak side buckling.  Ambulation/Gait Ambulation/Gait assistance: Max assist, +2 safety/equipment Gait Distance (Feet): 16 Feet Assistive device: 2 person hand held assist Gait Pattern/deviations: Step-to pattern, Decreased step length - left, Decreased step length - right, Staggering left General Gait Details: Patient with inability to control LLE weight shift and stride. Postural facilitation via wrap around support to elicit upright posture and manual weight shift. Multi modal cues to  initiate step. Increased time and effort. Gait velocity: decreased Gait velocity interpretation: <1.31 ft/sec, indicative of household ambulator    ADL: ADL Overall ADL's : Needs assistance/impaired Eating/Feeding: Set up, Sitting Eating/Feeding Details (indicate cue type and reason): required assist for opening containers Grooming: Minimal assistance, Sitting, Wash/dry hands, Wash/dry face Grooming Details (indicate cue type and reason): min A for tasks that require BUE assist Upper Body Bathing: Minimal assistance Lower Body Bathing: Moderate assistance Upper Body Dressing : Minimal assistance, Sitting Lower Body Dressing: Moderate assistance, Sit to/from stand Lower Body Dressing Details (indicate cue type and reason): required assist for donning socks, able to pull up after OT initiated Toilet Transfer: Moderate assistance, +2 for physical assistance, +2 for safety/equipment, Ambulation Toilet Transfer Details (indicate cue type and reason): 2 person HHA for short ambulation Toileting- Clothing Manipulation and Hygiene: Moderate assistance, Sit to/from  stand Functional mobility during ADLs: Moderate assistance, +2 for physical assistance, +2 for safety/equipment(2 person HHA) General ADL Comments: LUE decrease in function impacting performance as well as   Cognition: Cognition Overall Cognitive Status: No family/caregiver present to determine baseline cognitive functioning Orientation Level: Oriented to person, Oriented to place, Disoriented to time, Disoriented X4 Cognition Arousal/Alertness: Awake/alert Behavior During Therapy: WFL for tasks assessed/performed, Impulsive Overall Cognitive Status: No family/caregiver present to determine baseline cognitive functioning Area of Impairment: Attention, Following commands, Safety/judgement, Awareness, Problem solving Current Attention Level: Sustained Following Commands: Follows one step commands with increased time Safety/Judgement: Decreased awareness of safety, Decreased awareness of deficits Awareness: Emergent Problem Solving: Difficulty sequencing, Requires verbal cues, Requires tactile cues General Comments: At one point Pt meowed during session "I just like cats"  Blood pressure 116/71, pulse 71, temperature 98 F (36.7 C), temperature source Oral, resp. rate 18, height 5\' 10"  (1.778 m), weight 83.9 kg, SpO2 99 %. Physical Exam  Vitals reviewed. Constitutional: No distress.  72 year old right-handed male sitting up in chair  HENT:  Head: Normocephalic.  Eyes: Pupils are equal, round, and reactive to light.  Neck: Normal range of motion.  Neck is supple nontender  Cardiovascular: Normal rate.  Cardiac rate controlled  Respiratory: Effort normal.  Lungs clear to auscultation without wheeze  GI: Soft.  Abdomen is soft and nontender  Musculoskeletal:  Limited ROM left shoulder due to pain  Neurological:  Patient is alert.  Mood is flat but appropriate.  He provides his name and age as well as date of birth.  Follows simple commands,. LUE 3/5 to 4/5 prox to distal, some  limitations due to shoulder pain. LLE 4/5 prox to distal. RUE and RLE 5/5. Decreased sensory loss stocking glove Left more affected than right. Speech dysarthric.   Skin:  Surgical scar right knee, left shoulder    Results for orders placed or performed during the hospital encounter of 04/25/18 (from the past 24 hour(s))  Troponin I (q 6hr x 3)     Status: Abnormal   Collection Time: 04/25/18  6:54 PM  Result Value Ref Range   Troponin I 2.21 (HH) <0.03 ng/mL  Osmolality     Status: None   Collection Time: 04/25/18  6:54 PM  Result Value Ref Range   Osmolality 284 275 - 295 mOsm/kg  Urinalysis, Routine w reflex microscopic     Status: Abnormal   Collection Time: 04/25/18  9:20 PM  Result Value Ref Range   Color, Urine STRAW (A) YELLOW   APPearance CLEAR CLEAR   Specific Gravity, Urine 1.011 1.005 - 1.030   pH 6.0 5.0 -  8.0   Glucose, UA NEGATIVE NEGATIVE mg/dL   Hgb urine dipstick NEGATIVE NEGATIVE   Bilirubin Urine NEGATIVE NEGATIVE   Ketones, ur NEGATIVE NEGATIVE mg/dL   Protein, ur NEGATIVE NEGATIVE mg/dL   Nitrite NEGATIVE NEGATIVE   Leukocytes, UA NEGATIVE NEGATIVE  Urine rapid drug screen (hosp performed)     Status: Abnormal   Collection Time: 04/25/18  9:22 PM  Result Value Ref Range   Opiates POSITIVE (A) NONE DETECTED   Cocaine NONE DETECTED NONE DETECTED   Benzodiazepines NONE DETECTED NONE DETECTED   Amphetamines NONE DETECTED NONE DETECTED   Tetrahydrocannabinol NONE DETECTED NONE DETECTED   Barbiturates NONE DETECTED NONE DETECTED  Osmolality, urine     Status: Abnormal   Collection Time: 04/25/18  9:22 PM  Result Value Ref Range   Osmolality, Ur 252 (L) 300 - 900 mOsm/kg  Sodium, urine, random     Status: None   Collection Time: 04/25/18  9:22 PM  Result Value Ref Range   Sodium, Ur 23 mmol/L  Troponin I (q 6hr x 3)     Status: Abnormal   Collection Time: 04/26/18  1:18 AM  Result Value Ref Range   Troponin I 3.09 (HH) <0.03 ng/mL  Lipid panel      Status: None   Collection Time: 04/26/18  3:55 AM  Result Value Ref Range   Cholesterol 157 0 - 200 mg/dL   Triglycerides 123 <150 mg/dL   HDL 60 >40 mg/dL   Total CHOL/HDL Ratio 2.6 RATIO   VLDL 25 0 - 40 mg/dL   LDL Cholesterol 72 0 - 99 mg/dL  Basic metabolic panel     Status: Abnormal   Collection Time: 04/26/18  3:55 AM  Result Value Ref Range   Sodium 134 (L) 135 - 145 mmol/L   Potassium 4.8 3.5 - 5.1 mmol/L   Chloride 95 (L) 98 - 111 mmol/L   CO2 28 22 - 32 mmol/L   Glucose, Bld 89 70 - 99 mg/dL   BUN 19 8 - 23 mg/dL   Creatinine, Ser 1.02 0.61 - 1.24 mg/dL   Calcium 8.8 (L) 8.9 - 10.3 mg/dL   GFR calc non Af Amer >60 >60 mL/min   GFR calc Af Amer >60 >60 mL/min   Anion gap 11 5 - 15  CBC     Status: Abnormal   Collection Time: 04/26/18  3:55 AM  Result Value Ref Range   WBC 16.0 (H) 4.0 - 10.5 K/uL   RBC 4.01 (L) 4.22 - 5.81 MIL/uL   Hemoglobin 11.6 (L) 13.0 - 17.0 g/dL   HCT 35.7 (L) 39.0 - 52.0 %   MCV 89.0 78.0 - 100.0 fL   MCH 28.9 26.0 - 34.0 pg   MCHC 32.5 30.0 - 36.0 g/dL   RDW 14.5 11.5 - 15.5 %   Platelets 300 150 - 400 K/uL  Troponin I     Status: Abnormal   Collection Time: 04/26/18  3:55 AM  Result Value Ref Range   Troponin I 2.83 (HH) <0.03 ng/mL  CK     Status: None   Collection Time: 04/26/18  3:55 AM  Result Value Ref Range   Total CK 166 49 - 397 U/L   Ct Angio Head W Or Wo Contrast  Addendum Date: 04/25/2018   ADDENDUM REPORT: 04/25/2018 19:41 ADDENDUM: Cortical hypoattenuation along the right pre and postcentral gyrus is consistent with acute/subacute nonhemorrhagic infarct there is effacement adjacent sulci. Associated subacute arachnoid hemorrhage is considered less likely.  This is likely all related to a subacute ischemic event. Findings were discussed with Dr. Lorraine Lax at 7:40 p.m. Electronically Signed   By: San Morelle M.D.   On: 04/25/2018 19:41   Result Date: 04/25/2018 CLINICAL DATA:  Left-sided weakness. Walking for 2 days.  Cerebral aneurysm subarachnoid hemorrhage, cerebral vasospasm evaluation. EXAM: CT ANGIOGRAPHY HEAD AND NECK TECHNIQUE: Multidetector CT imaging of the head and neck was performed using the standard protocol during bolus administration of intravenous contrast. Multiplanar CT image reconstructions and MIPs were obtained to evaluate the vascular anatomy. Carotid stenosis measurements (when applicable) are obtained utilizing NASCET criteria, using the distal internal carotid diameter as the denominator. CONTRAST:  61mL ISOVUE-370 IOPAMIDOL (ISOVUE-370) INJECTION 76% COMPARISON:  Head without contrast 06/23/2017 FINDINGS: CT HEAD FINDINGS Brain: [name] matter hypoattenuation is present along the right pre and postcentral gyrus. No hemorrhage is present. There is no mass lesion. Ventricles proportionate to the degree of atrophy. Moderate diffuse white matter hypoattenuation is present bilaterally. Brainstem and cerebellum are otherwise normal. Vascular: Atherosclerotic calcifications are present within the cavernous internal carotid arteries bilaterally. There is hyperdense vessel. Skull: Calvarium is intact. No focal lytic or blastic lesions are present. Sinuses: The paranasal sinuses and mastoid air cells are clear. Orbits: Globes and orbits are within bilaterally. Review of the MIP images confirms the above findings CTA NECK FINDINGS Aortic arch: There is a common origin of the innominate and left common carotid artery. Mild atherosclerotic changes are present. There is no significant stenosis the great vessel origins Right carotid system: The right common carotid artery is within normal limits proximally. Eccentric atherosclerotic changes are present in the distal right common carotid artery. Lumen is narrowed to 3.5 mm. There is no stenosis relative to the more distal vessel. Atherosclerotic calcifications are present at the carotid bifurcation without significant stenosis in the cervical ICA. Left carotid system:  Left carotid artery is tortuous proximally. Atherosclerotic changes are present at the left carotid bifurcation. No significant stenosis relative the distal vessel. The cervical left ICA is normal. Vertebral arteries: The vertebral arteries are codominant. Both vertebral arteries originate from the subclavian arteries without significant stenosis. No significant injury or stenosis to either vertebral artery in the neck. Skeleton: Multilevel degenerative changes are present in the cervical spine. Grade 1 anterolisthesis is present at C3-4. There is chronic loss disc height with endplate uncovertebral disease at C4-5, C5-6, and C6-7. Osseous foraminal narrowing is greater right than left at C4-5 and C5-6. Other neck: The soft tissues of the neck are otherwise unremarkable. Salivary glands are within normal limits. No significant adenopathy is present. Thyroid is heterogeneous without a dominant lesion. No focal mucosal lesions are present. Upper chest: Paraseptal emphysematous changes are present along a disease. There is no focal nodule or mass lesion. Effusion is present. There is no pneumothorax. The thoracic inlet is within normal limits. Review of the MIP images confirms the above findings CTA HEAD FINDINGS Anterior circulation: Atherosclerotic calcifications are present in the cavernous internal carotid arteries bilaterally. There is no significant stenosis from the skull base to the ICA termini. The A1 and M1 segments are normal. The anterior communicating artery is patent. MCA bifurcations are within normal limits. There is moderate attenuation of distal ACA and MCA branch vessels bilaterally without a significant proximal stenosis or occlusion Posterior circulation: The vertebral arteries are codominant. Atherosclerotic changes are present at the dural margin of the left vertebral artery without significant stenosis. Vertebrobasilar junction is normal. Both posterior cerebral arteries originate from the  basilar tip. There is some attenuation of distal PCA branch vessels bilaterally without a significant proximal stenosis or occlusion. Venous sinuses: The dural sinuses are patent. Straight sinus and deep cerebral veins are intact. Cortical veins are within normal limits. Anatomic variants: None Delayed phase: The postcontrast images demonstrate no pathologic enhancement. Review of the MIP images confirms the above findings IMPRESSION: 1. Atherosclerotic changes within the mid and distal right common carotid artery narrows the lumen to 3.5 mm without a significant stenosis relative to the more distal vessel. 2. Atherosclerotic changes bilaterally at the carotid bifurcations without significant stenoses. 3. Atherosclerotic changes at the dural margin of the left vertebral artery without a significant stenosis. 4. Atherosclerotic changes of the cavernous internal carotid arteries bilaterally without significant stenosis. 5. Distal medium and small vessel disease is present throughout the anterior and posterior circulation without a significant proximal stenosis, aneurysm, or branch vessel occlusion otherwise. 6. Multilevel degenerative changes of the cervical spine as described above. Grade 1 anterolisthesis is present at C3-4. Electronically Signed: By: San Morelle M.D. On: 04/25/2018 14:58   Dg Chest 2 View  Result Date: 04/25/2018 CLINICAL DATA:  Elevated troponin.  Weakness. EXAM: CHEST - 2 VIEW COMPARISON:  April 18, 2018. FINDINGS: The heart size and mediastinal contours are within normal limits. Both lungs are clear. The visualized skeletal structures are stable. Spinal stimulator lead is unchanged. IMPRESSION: No active cardiopulmonary disease. Electronically Signed   By: Abelardo Diesel M.D.   On: 04/25/2018 14:38   Ct Head Wo Contrast  Result Date: 04/26/2018 CLINICAL DATA:  72 y/o M; stroke for follow-up. Left-sided weakness. EXAM: CT HEAD WITHOUT CONTRAST TECHNIQUE: Contiguous axial images  were obtained from the base of the skull through the vertex without intravenous contrast. COMPARISON:  04/25/2018 CT head, CTA head, CTA neck. FINDINGS: Brain: Multiple small foci of cortical hypoattenuation within the right posterior occipital and parietal lobes are increased in conspicuity when compared with the prior CT of the head. No hemorrhage or mass effect. Streak artifact from dental hardware largely obscures the posterior fossa. No extra-axial collection, hydrocephalus, effacement of basilar cisterns, or herniation. Vascular: No hyperdense vessel or unexpected calcification. Skull: Normal. Negative for fracture or focal lesion. Sinuses/Orbits: No acute finding. Other: None. IMPRESSION: Increased conspicuity of multiple small cortical infarctions in the right posterior occipital and parietal lobes. No new stroke, hemorrhage, or mass effect identified. Electronically Signed   By: Kristine Garbe M.D.   On: 04/26/2018 01:53   Ct Angio Neck W And/or Wo Contrast  Addendum Date: 04/25/2018   ADDENDUM REPORT: 04/25/2018 19:41 ADDENDUM: Cortical hypoattenuation along the right pre and postcentral gyrus is consistent with acute/subacute nonhemorrhagic infarct there is effacement adjacent sulci. Associated subacute arachnoid hemorrhage is considered less likely. This is likely all related to a subacute ischemic event. Findings were discussed with Dr. Lorraine Lax at 7:40 p.m. Electronically Signed   By: San Morelle M.D.   On: 04/25/2018 19:41   Result Date: 04/25/2018 CLINICAL DATA:  Left-sided weakness. Walking for 2 days. Cerebral aneurysm subarachnoid hemorrhage, cerebral vasospasm evaluation. EXAM: CT ANGIOGRAPHY HEAD AND NECK TECHNIQUE: Multidetector CT imaging of the head and neck was performed using the standard protocol during bolus administration of intravenous contrast. Multiplanar CT image reconstructions and MIPs were obtained to evaluate the vascular anatomy. Carotid stenosis  measurements (when applicable) are obtained utilizing NASCET criteria, using the distal internal carotid diameter as the denominator. CONTRAST:  11mL ISOVUE-370 IOPAMIDOL (ISOVUE-370) INJECTION 76% COMPARISON:  Head without contrast 06/23/2017 FINDINGS:  CT HEAD FINDINGS Brain: [name] matter hypoattenuation is present along the right pre and postcentral gyrus. No hemorrhage is present. There is no mass lesion. Ventricles proportionate to the degree of atrophy. Moderate diffuse white matter hypoattenuation is present bilaterally. Brainstem and cerebellum are otherwise normal. Vascular: Atherosclerotic calcifications are present within the cavernous internal carotid arteries bilaterally. There is hyperdense vessel. Skull: Calvarium is intact. No focal lytic or blastic lesions are present. Sinuses: The paranasal sinuses and mastoid air cells are clear. Orbits: Globes and orbits are within bilaterally. Review of the MIP images confirms the above findings CTA NECK FINDINGS Aortic arch: There is a common origin of the innominate and left common carotid artery. Mild atherosclerotic changes are present. There is no significant stenosis the great vessel origins Right carotid system: The right common carotid artery is within normal limits proximally. Eccentric atherosclerotic changes are present in the distal right common carotid artery. Lumen is narrowed to 3.5 mm. There is no stenosis relative to the more distal vessel. Atherosclerotic calcifications are present at the carotid bifurcation without significant stenosis in the cervical ICA. Left carotid system: Left carotid artery is tortuous proximally. Atherosclerotic changes are present at the left carotid bifurcation. No significant stenosis relative the distal vessel. The cervical left ICA is normal. Vertebral arteries: The vertebral arteries are codominant. Both vertebral arteries originate from the subclavian arteries without significant stenosis. No significant injury or  stenosis to either vertebral artery in the neck. Skeleton: Multilevel degenerative changes are present in the cervical spine. Grade 1 anterolisthesis is present at C3-4. There is chronic loss disc height with endplate uncovertebral disease at C4-5, C5-6, and C6-7. Osseous foraminal narrowing is greater right than left at C4-5 and C5-6. Other neck: The soft tissues of the neck are otherwise unremarkable. Salivary glands are within normal limits. No significant adenopathy is present. Thyroid is heterogeneous without a dominant lesion. No focal mucosal lesions are present. Upper chest: Paraseptal emphysematous changes are present along a disease. There is no focal nodule or mass lesion. Effusion is present. There is no pneumothorax. The thoracic inlet is within normal limits. Review of the MIP images confirms the above findings CTA HEAD FINDINGS Anterior circulation: Atherosclerotic calcifications are present in the cavernous internal carotid arteries bilaterally. There is no significant stenosis from the skull base to the ICA termini. The A1 and M1 segments are normal. The anterior communicating artery is patent. MCA bifurcations are within normal limits. There is moderate attenuation of distal ACA and MCA branch vessels bilaterally without a significant proximal stenosis or occlusion Posterior circulation: The vertebral arteries are codominant. Atherosclerotic changes are present at the dural margin of the left vertebral artery without significant stenosis. Vertebrobasilar junction is normal. Both posterior cerebral arteries originate from the basilar tip. There is some attenuation of distal PCA branch vessels bilaterally without a significant proximal stenosis or occlusion. Venous sinuses: The dural sinuses are patent. Straight sinus and deep cerebral veins are intact. Cortical veins are within normal limits. Anatomic variants: None Delayed phase: The postcontrast images demonstrate no pathologic enhancement. Review  of the MIP images confirms the above findings IMPRESSION: 1. Atherosclerotic changes within the mid and distal right common carotid artery narrows the lumen to 3.5 mm without a significant stenosis relative to the more distal vessel. 2. Atherosclerotic changes bilaterally at the carotid bifurcations without significant stenoses. 3. Atherosclerotic changes at the dural margin of the left vertebral artery without a significant stenosis. 4. Atherosclerotic changes of the cavernous internal carotid arteries bilaterally without  significant stenosis. 5. Distal medium and small vessel disease is present throughout the anterior and posterior circulation without a significant proximal stenosis, aneurysm, or branch vessel occlusion otherwise. 6. Multilevel degenerative changes of the cervical spine as described above. Grade 1 anterolisthesis is present at C3-4. Electronically Signed: By: San Morelle M.D. On: 04/25/2018 14:58     Assessment/Plan: Diagnosis: right parietal-occipital lobe infarcts 1. Does the need for close, 24 hr/day medical supervision in concert with the patient's rehab needs make it unreasonable for this patient to be served in a less intensive setting? Yes 2. Co-Morbidities requiring supervision/potential complications: HTN, Idiopathic peripheral neuropathy, diastolic congestive heart failure 3. Due to bladder management, bowel management, safety, skin/wound care, disease management, medication administration, pain management and patient education, does the patient require 24 hr/day rehab nursing? Yes 4. Does the patient require coordinated care of a physician, rehab nurse, PT (1-2 hrs/day, 5 days/week), OT (1-2 hrs/day, 5 days/week) and SLP (1-2 hrs/day, 5 days/week) to address physical and functional deficits in the context of the above medical diagnosis(es)? Yes Addressing deficits in the following areas: balance, endurance, locomotion, strength, transferring, bowel/bladder control,  bathing, dressing, feeding, grooming, toileting, cognition, speech and psychosocial support 5. Can the patient actively participate in an intensive therapy program of at least 3 hrs of therapy per day at least 5 days per week? Yes 6. The potential for patient to make measurable gains while on inpatient rehab is good 7. Anticipated functional outcomes upon discharge from inpatient rehab are modified independent and supervision  with PT, modified independent and supervision with OT, modified independent and supervision with SLP. 8. Estimated rehab length of stay to reach the above functional goals is: 12-18 days 9. Anticipated D/C setting: Home 10. Anticipated post D/C treatments: HH therapy and Outpatient therapy 11. Overall Rehab/Functional Prognosis: excellent  RECOMMENDATIONS: This patient's condition is appropriate for continued rehabilitative care in the following setting: CIR Patient has agreed to participate in recommended program. Yes Note that insurance prior authorization may be required for reimbursement for recommended care.  Comment: Rehab Admissions Coordinator to follow up.  Thanks,  Meredith Staggers, MD, Mellody Drown  I have personally performed a face to face diagnostic evaluation of this patient. Additionally, I have reviewed and concur with the physician assistant's documentation above.    Lavon Paganini Angiulli, PA-C 04/26/2018

## 2018-04-26 NOTE — Progress Notes (Signed)
Paged due to patient pulling off his telemetry leads, asking to be permitted to leave.  Upon visiting the room the patient appeared comfortable, oriented x4, aware of his current status, and the plane moving forward. He did not demonstrate aggressive behavior nor was any reported. Patient did not appear to be in imminent harm to self or others. Requested sleep aid, takes melatonin at home, which he believes works well.   Vitals:   04/26/18 0801 04/26/18 1256  BP: 124/76 116/71  Pulse: (!) 58 71  Resp: 17 18  Temp: 97.8 F (36.6 C) 98 F (36.7 C)  SpO2: 98% 99%   Plan: Ramelteon x 1 dose, continue gentle affirmation and orientation if agitated. Notify MD if aggresive. Continue delirium precautions and fall precautions

## 2018-04-26 NOTE — Progress Notes (Signed)
VASCULAR LAB PRELIMINARY  PRELIMINARY  PRELIMINARY  PRELIMINARY  Bilateral lower extremity venous duplex completed.    Preliminary report:  There is no DVT or SVT noted in the bilateral lower extremities.   Admir Candelas, RVT 04/26/2018, 3:47 PM

## 2018-04-26 NOTE — Evaluation (Addendum)
Physical Therapy Evaluation Patient Details Name: William Jordan MRN: [id number] DOB: 02/16/1946 Today's Date: 04/26/2018   History of Present Illness  This is a 72 year old male with a PMH of HTN, chronic back pain (under pain management, opoid abuse), idiopathic peripheral neuropathy (with nerve stimulator),and recent diagnosis of CHF; who presented with blurred vision, left arm and leg weakness, also had an episode of chest pain in the ER. CT found Increased conspicuity of multiple small cortical infarctions in the right posterior occipital and parietal lobes.  Clinical Impression  Patient seen for mobility assessment, at this time, patient presenting with significant functional deficits requiring max assist for basic ambulation and mobility due to left sided impairments. Prior to admission, patient reports independence with all activity, currently feel patient will need comprehensive therapies to maximize functional recovery. Recommend CIR consult at this time. Patient appears extremely motivated to progress and expressed emotions over current situation. Feel patient would be a very good candidate for CIR, will continue to see and progress as tolerated.    Follow Up Recommendations CIR;Supervision/Assistance - 24 hour    Equipment Recommendations  (TBD)    Recommendations for Other Services Rehab consult     Precautions / Restrictions Precautions Precautions: Fall Restrictions Weight Bearing Restrictions: No      Mobility  Bed Mobility Overal bed mobility: Needs Assistance Bed Mobility: Supine to Sit     Supine to sit: Min assist;HOB elevated     General bed mobility comments: increased time and effort to perform, patient with impulsivity, required cues to direct to task  Transfers Overall transfer level: Needs assistance Equipment used: 2 person hand held assist Transfers: Sit to/from Stand Sit to Stand: Mod assist;+2 physical assistance;+2 safety/equipment          General transfer comment: min A for boost, mod A +2 once in standing with L weak side buckling.   Ambulation/Gait Ambulation/Gait assistance: Max assist;+2 safety/equipment Gait Distance (Feet): 16 Feet Assistive device: 2 person hand held assist Gait Pattern/deviations: Step-to pattern;Decreased step length - left;Decreased step length - right;Staggering left Gait velocity: decreased Gait velocity interpretation: <1.31 ft/sec, indicative of household ambulator General Gait Details: Patient with inability to control LLE weight shift and stride. Postural facilitation via wrap around support to elicit upright posture and manual weight shift. Multi modal cues to initiate step. Increased time and effort.  Stairs            Wheelchair Mobility    Modified Rankin (Stroke Patients Only) Modified Rankin (Stroke Patients Only) Pre-Morbid Rankin Score: No symptoms Modified Rankin: Moderately severe disability     Balance Overall balance assessment: Needs assistance Sitting-balance support: No upper extremity supported;Feet supported Sitting balance-Leahy Scale: Fair     Standing balance support: Bilateral upper extremity supported Standing balance-Leahy Scale: Poor Standing balance comment: dependent on external support from therapy team                             Pertinent Vitals/Pain Pain Assessment: Faces Faces Pain Scale: Hurts little more Pain Location: Bilateral Feet, R shoulder Pain Descriptors / Indicators: Constant;Burning;Discomfort Pain Intervention(s): Limited activity within patient's tolerance;Monitored during session;Repositioned    Home Living Family/patient expects to be discharged to:: Private residence Living Arrangements: Spouse/significant other Available Help at Discharge: Family;Available 24 hours/day Type of Home: House Home Access: Stairs to enter Entrance Stairs-Rails: Right;Can reach Software engineer of Steps: 6 Home  Layout: One level Home Equipment: Gilford Rile -  2 wheels;Hand held shower head;Grab bars - tub/shower;Shower seat - built in;Cane - single point;Walker - 4 wheels      Prior Function Level of Independence: Independent         Comments: drives, cooks     Hand Dominance   Dominant Hand: Right    Extremity/Trunk Assessment   Upper Extremity Assessment Upper Extremity Assessment: LUE deficits/detail;Generalized weakness;RUE deficits/detail RUE Deficits / Details: pain in R shoulder limiting ROM to approx 90 degrees FF RUE: Unable to fully assess due to pain LUE Deficits / Details: grasp grossly 3/5, decreased sensation, biceps 4/5, triceps 4/5, decreased coordination for fine and gross motor, shoulder FF requires increased time and concentration LUE Sensation: decreased light touch LUE Coordination: decreased gross motor;decreased fine motor    Lower Extremity Assessment Lower Extremity Assessment: LLE deficits/detail LLE Deficits / Details: noted LLE strength assymetry compared to Right gross motions 3+ /5  LLE Sensation: decreased proprioception LLE Coordination: decreased fine motor;decreased gross motor       Communication   Communication: No difficulties  Cognition Arousal/Alertness: Awake/alert Behavior During Therapy: WFL for tasks assessed/performed;Impulsive Overall Cognitive Status: No family/caregiver present to determine baseline cognitive functioning Area of Impairment: Attention;Following commands;Safety/judgement;Awareness;Problem solving                   Current Attention Level: Sustained   Following Commands: Follows one step commands with increased time Safety/Judgement: Decreased awareness of safety;Decreased awareness of deficits Awareness: Emergent Problem Solving: Difficulty sequencing;Requires verbal cues;Requires tactile cues General Comments: At one point Pt meowed during session "I just like cats"      General Comments      Exercises      Assessment/Plan    PT Assessment Patient needs continued PT services  PT Problem List Decreased strength;Decreased activity tolerance;Decreased balance;Decreased mobility;Decreased coordination;Decreased safety awareness;Impaired sensation;Pain       PT Treatment Interventions DME instruction;Gait training;Stair training;Functional mobility training;Therapeutic activities;Therapeutic exercise;Balance training;Neuromuscular re-education;Patient/family education    PT Goals (Current goals can be found in the Care Plan section)  Acute Rehab PT Goals Patient Stated Goal: "get independent" PT Goal Formulation: With patient Time For Goal Achievement: 05/10/18 Potential to Achieve Goals: Good    Frequency Min 4X/week   Barriers to discharge        Co-evaluation PT/OT/SLP Co-Evaluation/Treatment: Yes Reason for Co-Treatment: Complexity of the patient's impairments (multi-system involvement);Necessary to address cognition/behavior during functional activity;For patient/therapist safety;To address functional/ADL transfers PT goals addressed during session: Mobility/safety with mobility;Balance OT goals addressed during session: ADL's and self-care       AM-PAC PT "6 Clicks" Daily Activity  Outcome Measure Difficulty turning over in bed (including adjusting bedclothes, sheets and blankets)?: A Little Difficulty moving from lying on back to sitting on the side of the bed? : A Little Difficulty sitting down on and standing up from a chair with arms (e.g., wheelchair, bedside commode, etc,.)?: Unable Help needed moving to and from a bed to chair (including a wheelchair)?: A Lot Help needed walking in hospital room?: A Lot Help needed climbing 3-5 steps with a railing? : Total 6 Click Score: 12    End of Session Equipment Utilized During Treatment: Gait belt Activity Tolerance: Patient tolerated treatment well Patient left: in chair;with call bell/phone within reach;with chair alarm  set Nurse Communication: Mobility status PT Visit Diagnosis: Difficulty in walking, not elsewhere classified (R26.2);Other symptoms and signs involving the nervous system (R29.898);Ataxic gait (R26.0)    Time: 7371-0626 PT Time Calculation (min) (ACUTE ONLY): 22  min   Charges:   PT Evaluation $PT Eval Moderate Complexity: 1 Mod          Alben Deeds, PT DPT  Board Certified Neurologic Specialist [place] 04/26/2018, 1:42 PM

## 2018-04-26 NOTE — Progress Notes (Signed)
OT Cancellation Note  Patient Details Name: William Jordan MRN: [id number] DOB: Sep 02, 1946   Cancelled Treatment:    Reason Eval/Treat Not Completed: Patient at procedure or test/ unavailable(Echo)  [name] 04/26/2018, 11:20 AM  Hulda Humphrey OTR/L [phone]

## 2018-04-26 NOTE — Progress Notes (Signed)
Notified Dr. Sherry Ruffing of abnormal Lab values specifically WBC level of 16, will continue to monitor. Arlis Porta

## 2018-04-26 NOTE — Progress Notes (Signed)
Subjective: Patient reports doing better today, he denies any pain at the moment. Neurology evaluated him last night and thought that this was status epilepticus so he was started on anti seizure medications. His arm seems to be improved today and was not jerking around like it was yesterday. He also got the remote for his TENS unit but did not adjust it. He   Objective:  Vital signs in last 24 hours: Vitals:   04/26/18 0035 04/26/18 0235 04/26/18 0435 04/26/18 0634  BP: (!) 110/93 113/69 108/69   Pulse: (!) 50 (!) 52 (!) 49 60  Resp: 18 18 16 17   Temp: 98 F (36.7 C) 97.9 F (36.6 C) 98 F (36.7 C) 98.3 F (36.8 C)  TempSrc: Axillary Axillary Axillary Axillary  SpO2: 98% 94% 96%   Weight:      Height:        General: Well nourished, well appearing, NAD  HEENT: Normocephalic, nontraumatic, midline trachea Cardiac: RRR, normal S1, S2, no murmurs, rubs or gallops  Pulmonary: Lungs CTA bilaterally, no wheezing, rhonchi or rales  Back: Subcutaneous TENS unit in middle back Abdomen: Soft, non-tender, +bowel sounds, no guarding or masses noted  Extremity: No LE edema, no muscle atrophy, no lesions or wounds noted  Neuro: Alert and oriented x3, CN 2-12 intact except for decreased left shoulder raise strength, PERRLA, 5/5 strength in right extremity, 4/5 in left hand grip, mild dysmetria on left finger to nose and hand flip, moves all extremities Psychiatry: Normal mood and affect    Assessment/Plan: This is a 72 year old male with a PMH of HTN, chronic back pain, idiopathic peripheral neuropathy (with nerve stimulator) who presented with left arm and leg weakness, also had an episode of chest pain in the ER with a 5-6 beat run of NSVT.  Right posterior and occipital stroke: Patient presented with left arm weakness, left leg weakness, and left eye blurriness. His CT scan showed multiple small cortical infarctions in the right posterior occipital and parietal lobes but no new stroke or  infarct. MRI was not able to be done due to his TENS unit. CTA head and neck showed right mid CCA large soft plaque with surface ulceration. Neurolgoy is following and will get an TEE and loop.  -ASA 325 -Clopidogrel 75 daily -F/u Echocardiogram  -PT and OT evaluated and recommended CIR, supervision/assistance 24 hours -Cholesterol 157, HDL 60, LDL 72, Triglycerides 123 -Repeat CT head tomorrow  Left arm rhythmic jerking: Patients left arm had rhythmic jerking around when he was admitted, it had started that day. It was possible that this was due to a focal status epilepticus and neurology started him on keppra and ordered and EEG. EEG showed no signs of epileptiform activity and his symptoms resolved overnight. He did get his remote for the TENS unit and made some adjustments to it. Neurology discontinued the AEDS.  -Seizure precautions -Neurology on board  Chest pain: Patient had one episode of chest pain while in the ED, was sharp in nature. Had an episode fo 5-6 beat NSVT around that time. Was started on amiodarone drip by ED. Troponin was 0.22, EKG showed some elevated ST elevations in inferior leads, isoelectric to TP segment. Cardiology evaluated and did not think this was consistent with STEMI, can be seen with a CVA, embolism to cerebral or coronary systems. He did not have any symptoms since that episode. Troponin's increased overnight to 2.21 and 3.09, cardiology was notified and since EKG did not show deep  T wave inversion or ST elevation with tombstone they did not recommend intervention.  -Cardiology on board -Repeat Echo -ASA 325 -Lipitor 40 mg daily -CK was 166 -Discontinued amiodarone drip  AKI: On admission BUN 30, Cr 1.37, elevated from base line. Today it showed a BUN 19 and Cr 1.02. -BMP in AM -Bladder scan  HTN: Normotensive on exam. On amlodipine at home.  -Hold home medications   Chronic back pain Idiopathic peripheral neuropathy: Has a nerve stimulator in the  back. He states that he has it always on.  -Oxycodone 20mg  q4 hours for pain -Continue gabapentin  GERD: Pantoprazole  FEN: No fluids, replete lytes prn, regular VTE ppx: SCDs Code Status: FULL   FEN: Carrier fluids, replete lytes prn, regular diet  VTE ppx: Lovenox and plavix Code Status: FULL    Dispo: Anticipated discharge in approximately 1-2 day(s).   Asencion Noble, MD 04/26/2018, 7:04 AM Pager: [phone]

## 2018-04-26 NOTE — Progress Notes (Signed)
Inpatient Rehabilitation  Per PT and OT request, patient was screened by Gunnar Fusi for appropriateness for an Inpatient Acute Rehab consult.  At this time we are recommending an Inpatient Rehab consult.  Text paged medical team to request.  Please order if you are agreeable.   Carmelia Roller., CCC/SLP Admission Coordinator  [place]  Cell [phone]

## 2018-04-26 NOTE — Progress Notes (Addendum)
Patient was left in chair with chair alarm by physical therapy. Patient's chair alarm went off. Levada Dy, charge nurse, walked in room to see patient sitting on the floor. Patient states " I sat myself down", however the event was unwitnessed. Patient denies any pain or injury. He was put back in chair by RN staff with legs elevated.   Dr. Sherry Ruffing called and made aware.   Wife, Judeen Hammans was called and made aware as well.   Julieanne Cotton, RN

## 2018-04-26 NOTE — Progress Notes (Signed)
STROKE TEAM PROGRESS NOTE   SUBJECTIVE (INTERVAL HISTORY) No family is at the bedside.  He is sitting in chair for lunch. He has intermittent sharp pain at left shoulder which is chronic. He also has mild weakness at left arm, distal more than proximal. He denies heart palpitation. Reviewed CTA neck with radiologist Dr. Nevada Crane, seems to have right CCA large soft plaque with ulceration, not able to rule out thrombus though.    OBJECTIVE Vitals:   04/26/18 0235 04/26/18 0435 04/26/18 0634 04/26/18 0801  BP: 113/69 108/69  124/76  Pulse: (!) 52 (!) 49 60 (!) 58  Resp: 18 16 17 17   Temp: 97.9 F (36.6 C) 98 F (36.7 C) 98.3 F (36.8 C) 97.8 F (36.6 C)  TempSrc: Axillary Axillary Axillary Oral  SpO2: 94% 96%  98%  Weight:      Height:        CBC:  Recent Labs  Lab 04/25/18 1243 04/25/18 1305 04/26/18 0355  WBC 21.2*  --  16.0*  NEUTROABS 15.5*  --   --   HGB 11.1* 11.9* 11.6*  HCT 35.2* 35.0* 35.7*  MCV 90.3  --  89.0  PLT 330  --  350    Basic Metabolic Panel:  Recent Labs  Lab 04/25/18 1243 04/25/18 1305 04/26/18 0355  NA 128* 127* 134*  K 5.1 5.0 4.8  CL 93* 92* 95*  CO2 25  --  28  GLUCOSE 98 103* 89  BUN 30* 36* 19  CREATININE 1.37* 1.30* 1.02  CALCIUM 8.6*  --  8.8*  MG 1.9  --   --     Lipid Panel:     Component Value Date/Time   CHOL 157 04/26/2018 0355   TRIG 123 04/26/2018 0355   HDL 60 04/26/2018 0355   CHOLHDL 2.6 04/26/2018 0355   VLDL 25 04/26/2018 0355   LDLCALC 72 04/26/2018 0355   HgbA1c:  Lab Results  Component Value Date   HGBA1C 6.1 (H) 08/15/2015   Urine Drug Screen:     Component Value Date/Time   LABOPIA POSITIVE (A) 04/25/2018 2122   COCAINSCRNUR NONE DETECTED 04/25/2018 2122   LABBENZ NONE DETECTED 04/25/2018 2122   AMPHETMU NONE DETECTED 04/25/2018 2122   THCU NONE DETECTED 04/25/2018 2122   LABBARB NONE DETECTED 04/25/2018 2122    Alcohol Level     Component Value Date/Time   ETH <10 12/10/2017 1607     IMAGING I have personally reviewed the radiological images below and agree with the radiology interpretations.  Ct Angio Head W Or Wo Contrast Ct Angio Neck W And/or Wo Contrast 04/25/2018   ADDENDUM  Cortical hypoattenuation along the right pre and postcentral gyrus is consistent with acute/subacute nonhemorrhagic infarct there is effacement adjacent sulci.  Associated subacute arachnoid hemorrhage is considered less likely.  This is likely all related to a subacute ischemic event.   IMPRESSION:  1. Atherosclerotic changes within the mid and distal right common carotid artery narrows the lumen to 3.5 mm without a significant stenosis relative to the more distal vessel.  2. Atherosclerotic changes bilaterally at the carotid bifurcations without significant stenoses.  3. Atherosclerotic changes at the dural margin of the left vertebral artery without a significant stenosis.  4. Atherosclerotic changes of the cavernous internal carotid arteries bilaterally without significant stenosis.  5. Distal medium and small vessel disease is present throughout the anterior and posterior circulation without a significant proximal stenosis, aneurysm, or branch vessel occlusion otherwise.  6. Multilevel degenerative changes of  the cervical spine as described above. Grade 1 anterolisthesis is present at C3-4.   Ct Head Wo Contrast 04/26/2018 IMPRESSION:  Increased conspicuity of multiple small cortical infarctions in the right posterior occipital and parietal lobes.  No new stroke, hemorrhage, or mass effect identified.   EEG 04/26/2018 IMPRESSION: This is an abnormal EEG secondary to posterior background slowing. This finding may be seen with a diffuse gray matter disturbance that is etiologically nonspecific, but may include a dementia, among other possibilities.  No epileptiform activity is noted.    Transthoracic Echocardiogram - pending  LE venous doppler pending  TEE pending    PHYSICAL  EXAM  Temp:  [97.8 F (36.6 C)-98.3 F (36.8 C)] 98 F (36.7 C) (08/12 1256) Pulse Rate:  [46-93] 71 (08/12 1256) Resp:  [12-29] 18 (08/12 1256) BP: (105-146)/(65-99) 116/71 (08/12 1256) SpO2:  [94 %-100 %] 99 % (08/12 1256)  General - Well nourished, well developed, in no apparent distress.  Ophthalmologic - fundi not visualized due to noncooperation.  Cardiovascular - Regular rate and rhythm.  Mental Status -  Level of arousal and orientation to time, place, and person were intact. Language including expression, naming, repetition, comprehension was assessed and found intact.  Cranial Nerves II - XII - II - Visual field intact OU. III, IV, VI - Extraocular movements intact. V - Facial sensation intact bilaterally. VII - Facial movement intact bilaterally. VIII - Hearing & vestibular intact bilaterally. X - Palate elevates symmetrically. XI - Chin turning & shoulder shrug intact bilaterally. XII - Tongue protrusion intact.  Motor Strength - The patient's strength was normal in all extremities except LUE 4-/5 deltoid, 4+/5 bicep and tricep, 3/5 finger grip and pronator drift was present on the left. Intermittent sharp pain at left shoulder, chronic. Bulk was normal and fasciculations were absent.   Motor Tone - Muscle tone was assessed at the neck and appendages and was normal.  Reflexes - The patient's reflexes were symmetrical in all extremities and he had no pathological reflexes.  Sensory - Light touch, temperature/pinprick were assessed and were symmetrical.    Coordination - The patient had normal movements in the hands with no ataxia or dysmetria.  Tremor was absent.  Gait and Station - deferred.    ASSESSMENT/PLAN Mr. RUDOLPHO CLAXTON is a 72 y.o. male with history of anxiety, HTN, idiopathic peripheral neuropathy with nerve stimulator, and chronic pain (under pain management, opoid abuse) presenting with Lt sided weakness, sensory decline, transient L eye visual  disturbance, intermittent right arm jerking concerning for focal status epilepticus, and chest pain associated with NSVT. Discharged from [facility] Sunday for treatment of angioedema from ACE I. He did not receive IV t-PA due to late presentation.   Stroke: Rt MCA frontal and occipital infarcts - embolic pattern, could be due to ulcerative large soft plaque right CCA vs. cardioembolic source.  Resultant  Left hand weakness  CT head - No new stroke, hemorrhage, or mass effect identified. Multiple old infarcts.  MRI head - not able to perform due to Spinal stimulator  CT repeat - Cortical hypoattenuation along the right pre and postcentral gyrus c/w acute/subacute infarct  CT repeat in am pending  CTA head and neck - right mid CCA large soft plaque with surface ulceration  Carotid Doppler - CTA neck performed or pending - carotid dopplers not indicated.  2D Echo - pending  EEG - abnormal EEG secondary to posterior background slowing.   LE venous doppler pending  TEE  and loop pending  LDL - 72  HgbA1c - pending  UDS negative  VTE prophylaxis - Lovenox  Diet - regular with thin liquids  No antithrombotic prior to admission, now on aspirin 325 mg daily and clopidogrel 75 mg daily. Continue DAPT for 3 months and then plavix alone.   Patient counseled to be compliant with his antithrombotic medications  Ongoing aggressive stroke risk factor management  Therapy recommendations:  pending  Disposition:  Pending  Right CCA stenosis  Soft plaque with ulceration vs. Thrombus at mid CCA on CTA  On DAPT  Pending TEE and loop to rule out cardiac source  Transient V-tach  Associated with chest pain  Shown in tele monitoring in ER  Was put on amiodarone IV  Cardiology on board  Will consider loop recorder for further mintoring  ? Seizure-like activity ?  Left shoulder jerking  Was loaded with fosphenytoin  Was on keppra bid  EEG no seizure  No left  shoulder jerking overnight - pt has left shoulder pain chronic   Will d/c AEDs at this time  Continue monitoring  Seizure precaution  Hypertension  BP mildly low at times. . Permissive hypertension (OK if < 220/120) but gradually normalize in 5-7 days . Long-term BP goal normotensive  Hyperlipidemia  Lipid lowering medication PTA:  none  LDL 72, goal < 70  Current lipid lowering medication: Lipitor 40 mg daily  Continue statin at discharge  Tobacco abuse  Current smoker  Smoking cessation counseling provided  Pt is willing to quit  Other Stroke Risk Factors  Advanced age  ETOH use, advised to drink no more than 1 alcoholic beverage per day.  Hx of stroke by imaging - Multiple old lacunar infarcts.  Other Active Problems  Recent angioedema from ACEI  Bradycardia  Leukocytosis WBC 21.2->16.0(? If pt received steroids for treatment of angioedema)   Hyponatremia Na 127->134  Chronic pain - opiods  Hospital day # 1  I spent  35 minutes in total face-to-face time with the patient, more than 50% of which was spent in counseling and coordination of care, reviewing test results, images and medication, and discussing the diagnosis of stroke, right CCA stenosis, V-tach, seizure, treatment plan and potential prognosis. This patient's care requiresreview of multiple databases, neurological assessment, discussion with family, other specialists and medical decision making of high complexity.   Rosalin Hawking, MD PhD Stroke Neurology 04/26/2018 1:38 PM     To contact Stroke Continuity provider, please refer to [email or web address] After hours, contact General Neurology

## 2018-04-26 NOTE — Care Management Note (Signed)
Case Management Note  Patient Details  Name: William Jordan MRN: [id number] Date of Birth: 04/27/1946  Subjective/Objective:     Pt admitted with CVA. He is from home with spouse. PCP:  Dr Nona Dell Insurnace: HTA               Action/Plan: Awaiting PT/OT evals. CM following for d/c needs, physician orders.   Contact person: Judeen Hammans:   [phone]  [phone]     Expected Discharge Date:                  Expected Discharge Plan:     In-House Referral:     Discharge planning Services     Post Acute Care Choice:    Choice offered to:     DME Arranged:    DME Agency:     HH Arranged:    HH Agency:     Status of Service:  In process, will continue to follow  If discussed at Long Length of Stay Meetings, dates discussed:    Additional Comments:  Pollie Friar, RN 04/26/2018, 1:06 PM

## 2018-04-26 NOTE — Progress Notes (Signed)
  Echocardiogram 2D Echocardiogram has been performed.  William Jordan William Jordan 04/26/2018, 11:31 AM

## 2018-04-27 ENCOUNTER — Encounter (HOSPITAL_COMMUNITY)
Admission: EM | Disposition: A | Discharge status: Rehabilitation Facility | Payer: Self-pay | Source: Home / Self Care | Attending: Oncology

## 2018-04-27 ENCOUNTER — Other Ambulatory Visit: Payer: Self-pay | Admitting: Medical

## 2018-04-27 ENCOUNTER — Encounter (HOSPITAL_COMMUNITY): Payer: Self-pay

## 2018-04-27 ENCOUNTER — Inpatient Hospital Stay (HOSPITAL_COMMUNITY): Payer: PPO

## 2018-04-27 DIAGNOSIS — R748 Abnormal levels of other serum enzymes: Secondary | ICD-10-CM

## 2018-04-27 DIAGNOSIS — I6389 Other cerebral infarction: Secondary | ICD-10-CM

## 2018-04-27 DIAGNOSIS — I214 Non-ST elevation (NSTEMI) myocardial infarction: Secondary | ICD-10-CM

## 2018-04-27 DIAGNOSIS — I10 Essential (primary) hypertension: Secondary | ICD-10-CM

## 2018-04-27 HISTORY — PX: LOOP RECORDER INSERTION: EP1214

## 2018-04-27 HISTORY — PX: TEE WITHOUT CARDIOVERSION: SHX5443

## 2018-04-27 LAB — CREATININE, SERUM
Creatinine, Ser: 0.99 mg/dL (ref 0.61–1.24)
GFR calc Af Amer: >60 mL/min (ref >60)
GFR calc non Af Amer: >60 mL/min (ref >60)

## 2018-04-27 LAB — HEMOGLOBIN A1C
Hgb A1c MFr Bld: 5.8 % — ABNORMAL HIGH (ref 4.8–5.6)
Mean Plasma Glucose: 120 mg/dL

## 2018-04-27 LAB — GLUCOSE, CAPILLARY: GLUCOSE-CAPILLARY: 114 mg/dL — AB (ref 70–99)

## 2018-04-27 LAB — CBC
HEMATOCRIT: 38.3 % — AB (ref 39.0–52.0)
Hemoglobin: 12.5 g/dL — ABNORMAL LOW (ref 13.0–17.0)
MCH: 28.9 pg (ref 26.0–34.0)
MCHC: 32.6 g/dL (ref 30.0–36.0)
MCV: 88.7 fL (ref 78.0–100.0)
PLATELETS: 324 10*3/uL (ref 150–400)
RBC: 4.32 MIL/uL (ref 4.22–5.81)
RDW: 14.4 % (ref 11.5–15.5)
WBC: 15.5 10*3/uL — ABNORMAL HIGH (ref 4.0–10.5)

## 2018-04-27 SURGERY — ECHOCARDIOGRAM, TRANSESOPHAGEAL
Anesthesia: Moderate Sedation

## 2018-04-27 SURGERY — LOOP RECORDER INSERTION

## 2018-04-27 MED ORDER — FENTANYL CITRATE (PF) 100 MCG/2ML IJ SOLN
INTRAMUSCULAR | Status: AC
  Filled 2018-04-27: qty 2

## 2018-04-27 MED ORDER — MIDAZOLAM HCL 5 MG/ML IJ SOLN
INTRAMUSCULAR | Status: AC
  Filled 2018-04-27: qty 2

## 2018-04-27 MED ORDER — ONDANSETRON HCL 4 MG/2ML IJ SOLN: 4.0000 mg | Freq: Four times a day (QID) | INTRAMUSCULAR | Status: DC | PRN

## 2018-04-27 MED ORDER — ACETAMINOPHEN 325 MG PO TABS
325.0000 mg | ORAL_TABLET | ORAL | Status: DC | PRN
  Administered 2018-04-27: 650 mg via ORAL
  Filled 2018-04-27: qty 2

## 2018-04-27 MED ORDER — MIDAZOLAM HCL 10 MG/2ML IJ SOLN
INTRAMUSCULAR | Status: DC | PRN
  Administered 2018-04-27: 2 mg via INTRAVENOUS
  Administered 2018-04-27: 1 mg via INTRAVENOUS
  Administered 2018-04-27: 2 mg via INTRAVENOUS
  Administered 2018-04-27: 1 mg via INTRAVENOUS
  Administered 2018-04-27: 2 mg via INTRAVENOUS

## 2018-04-27 MED ORDER — HEPARIN SODIUM (PORCINE) 5000 UNIT/ML IJ SOLN
5000.0000 [IU] | Freq: Three times a day (TID) | INTRAMUSCULAR | Status: DC
  Administered 2018-04-27 – 2018-04-28 (×4): 5000 [IU] via SUBCUTANEOUS
  Filled 2018-04-27 (×4): qty 1

## 2018-04-27 MED ORDER — FENTANYL CITRATE (PF) 100 MCG/2ML IJ SOLN
INTRAMUSCULAR | Status: DC | PRN
  Administered 2018-04-27 (×3): 25 ug via INTRAVENOUS

## 2018-04-27 MED ORDER — LIDOCAINE-EPINEPHRINE 1 %-1:100000 IJ SOLN
INTRAMUSCULAR | Status: DC | PRN
  Administered 2018-04-27: 20 mL

## 2018-04-27 SURGICAL SUPPLY — 2 items
LOOP REVEAL LINQSYS (Prosthesis & Implant Heart) ×3 IMPLANT
PACK LOOP INSERTION (CUSTOM PROCEDURE TRAY) ×3 IMPLANT

## 2018-04-27 NOTE — Progress Notes (Signed)
RN confirmed with radiology staff that pt will get CT scan today.  Ave Filter, RN

## 2018-04-27 NOTE — Progress Notes (Signed)
Patient is now awake and alert. RN able to perform assessment.   Ave Filter, RN

## 2018-04-27 NOTE — CV Procedure (Signed)
Procedure: TEE  Indication: CVA  Sedation: Versed 8 mg IV, Fentanyl 75 mcg IV  Findings: Please see echo section for full report.  Normal LV size and systolic function, EF 06-23%. Mild LV hypertrophy.  Normal RV size and systolic function.  Normal right atrial size.  Normal left atrial size, no LA appendage thrombus. No PFO or ASD, negative bubble study. No significant TR.  Trivial MR.  Trileaflet aortic valve without stenosis or regurgitation.  Normal caliber thoracic aorta with grade 3 plaque in descending thoracic aorta.   Impression: No source of embolus.   Loralie Champagne 04/27/2018 8:39 AM

## 2018-04-27 NOTE — Progress Notes (Signed)
Medicine attending discharge note: I personally examined this patient today together with resident physician Dr Lonia Skinner and I concur with her evaluation and management plan as recorded in her daily progress note in which will be subsequently documented in her discharge summary.  72 year old hypertensive man who presented with a 2-day history of acute onset of left arm and leg weakness.  While under evaluation in the hospital he developed spontaneous tonic-clonic jerking motions of his left upper extremity.  Transient episode of chest pain.  Transient nonsustained ventricular arrhythmia.  Evaluation revealed multiple small cortical infarctions in the right posterior occipital and parietal lobes.  Diffuse atherosclerotic changes in multiple intracranial vessels.  Unable to have a MRI due to an implanted TENS unit. Initial amiodarone infusion stopped after just a few hours with no subsequent arrhythmias noted.  Elevation of troponin enzymes disproportionate to clinical and EKG findings and likely represent troponin leakage from seizure activity. Transthoracic echo ejection fraction 60-65% with normal wall motion.  Mild LVH. Neurology consultants felt this could be an embolic stroke. Large soft ulcerative plaque seen in the right common carotid artery on CT angiogram. Transesophageal echocardiogram with bubble study showed no ASD, PFO, or intracardiac source of emboli. Dual antiplatelet agents initiated. A loop recorder was implanted in the subcutaneous tissues of the left pectoral region. Neurologic status improved.  Residual decreased left hand grip and mild clumsiness on rapid alternating movements.  Full return of proximal motor strength and left shoulder shrug. He was seen in consultation by physical and rehabilitation medicine and is an acceptable candidate for inpatient rehab.  Disposition: Condition stable and improved at time of discharge He will be discharged to be readmitted to the  inpatient rehabilitation service pending insurance approval. There were no complications

## 2018-04-27 NOTE — Progress Notes (Signed)
Inpatient Rehabilitation Admissions Coordinator  I will begin insurance authorization for a possible inpt rehab admit pending insurance approval. I will follow up tomorrow.  Danne Baxter, RN, MSN Rehab Admissions Coordinator [phone] 04/27/2018 4:46 PM

## 2018-04-27 NOTE — Progress Notes (Signed)
No sitter available at this time. Bed alarm activated. Will perform frequent bedside round. Patient was alert/oriented,calm and able to follow command after returned from TEE. NO other distress noted. Will continue to monitor.   Ave Filter, RN

## 2018-04-27 NOTE — Progress Notes (Signed)
PT Cancellation Note  Patient Details Name: William Jordan MRN: [id number] DOB: [birth year]   Cancelled Treatment:    Reason Eval/Treat Not Completed: Fatigue/lethargy limiting ability to participate PT will continue to follow acutely.    Salina April, PTA Pager: [phone]   04/27/2018, 3:32 PM

## 2018-04-27 NOTE — Interval H&P Note (Signed)
History and Physical Interval Note:  04/27/2018 8:14 AM  William Jordan  has presented today for surgery, with the diagnosis of STROKE  The various methods of treatment have been discussed with the patient and family. After consideration of risks, benefits and other options for treatment, the patient has consented to  Procedure(s): TRANSESOPHAGEAL ECHOCARDIOGRAM (TEE) (N/A) as a surgical intervention .  The patient's history has been reviewed, patient examined, no change in status, stable for surgery.  I have reviewed the patient's chart and labs.  Questions were answered to the patient's satisfaction.     [name] [employer]

## 2018-04-27 NOTE — Consult Note (Addendum)
ELECTROPHYSIOLOGY CONSULT NOTE  Patient ID: William Jordan MRN: [id number], DOB/AGE: 1946/03/22   Admit date: 04/25/2018 Date of Consult: 04/27/2018  Primary Physician: Townsend Roger, MD Primary Cardiologist: Croitoru (new this admission) Reason for Consultation: Cryptogenic stroke; recommendations regarding Implantable Loop Recorder  History of Present Illness EP has been asked to evaluate William Jordan for placement of an implantable loop recorder to monitor for atrial fibrillation by Dr Erlinda Hong.  The patient was admitted on 04/25/2018 with left arm and left leg weakness.  Imaging demonstrated right frontal MCA and occipital infarcts felt to be embolic 2/2 unknown source.  He has also ruled in for NSTEMI and plan is for outpatient myoview.   he has undergone workup for stroke including echocardiogram and carotid dopplers.  The patient has been monitored on telemetry which has demonstrated sinus rhythm with no arrhythmias.  Inpatient stroke work-up is to be completed with a TEE.   Echocardiogram this admission demonstrated EF 60-65%, mild LVH, LA 49.  Lab work is reviewed.  Prior to admission, the patient denies shortness of breath, dizziness, palpitations, or syncope.  They are recovering from their stroke with plans to go to rehab at discharge.   Past Medical History:  Diagnosis Date  . Angioedema    from Benazepril reaction  . Anxiety    takes Celexa  . Arthritis   . Chronic back pain   . Enlarged prostate   . History of blood transfusion   . Hypertension   . Neuropathy      Surgical History:  Past Surgical History:  Procedure Laterality Date  . COLONOSCOPY     one polyp removed - benign  . ELBOW SURGERY Right   . HERNIA REPAIR     umbilical  . JOINT REPLACEMENT Left    knee, x 2  . JOINT REPLACEMENT Right    hip  . JOINT REPLACEMENT Left    partial shoulder  . SPINAL CORD STIMULATOR INSERTION N/A 09/28/2014   Procedure: LUMBAR SPINAL CORD STIMULATOR INSERTION;  Surgeon:  Melina Schools, MD;  Location: [facility];  Service: Orthopedics;  Laterality: N/A;  . TONSILLECTOMY    . VASECTOMY       Medications Prior to Admission  Medication Sig Dispense Refill Last Dose  . alfuzosin (UROXATRAL) 10 MG 24 hr tablet Take 10 mg by mouth daily after supper.   04/24/2018 at pm  . amLODipine (NORVASC) 10 MG tablet Take 1 tablet (10 mg total) by mouth daily. 30 tablet 11 04/25/2018 at am  . celecoxib (CELEBREX) 100 MG capsule Take 100 mg by mouth 2 (two) times daily.    04/25/2018 at am  . cetirizine (ZYRTEC) 10 MG tablet Take 10 mg by mouth 2 (two) times daily.   04/25/2018 at am  . cholecalciferol (VITAMIN D) 1000 units tablet Take 2,000 Units by mouth daily.   04/25/2018 at am  . citalopram (CELEXA) 20 MG tablet Take 20 mg by mouth daily.   04/25/2018 at am  . cloNIDine (CATAPRES) 0.2 MG tablet Take 0.2 mg by mouth 2 (two) times daily.   04/25/2018 at am  . colchicine 0.6 MG tablet Take 0.6 mg by mouth 3 (three) times daily as needed (for gout flares).    3 months ago  . docusate sodium (COLACE) 100 MG capsule Take 2 capsules (200 mg total) by mouth at bedtime. (Patient taking differently: Take 100 mg by mouth at bedtime. ) 30 capsule 0 04/24/2018 at pm  . gabapentin (NEURONTIN) 600  MG tablet Take 300 mg by mouth 3 (three) times daily.    04/25/2018 at am  . omeprazole (PRILOSEC) 20 MG capsule Take 20 mg by mouth daily.    04/25/2018 at Unknown time  . Oxycodone HCl 20 MG TABS Take 1 tablet (20 mg total) by mouth every 8 (eight) hours as needed. (Patient taking differently: Take 20 mg by mouth every 4 (four) hours as needed (pain). ) 1 tablet 0 04/25/2018 at 900  . Saw Palmetto, Serenoa repens, (SAW PALMETTO PO) Take 1 capsule by mouth daily.   04/24/2018 at Unknown time  . morphine (MS CONTIN) 15 MG 12 hr tablet Take 1 tablet (15 mg total) by mouth every 12 (twelve) hours. (Patient not taking: Reported on 12/10/2017) 60 tablet 0 Not Taking at Unknown time  . polyethylene glycol (MIRALAX /  GLYCOLAX) packet Take 17 g by mouth daily. (Patient not taking: Reported on 04/25/2018) 14 each 0 Not Taking at Unknown time  . vitamin B-12 (CYANOCOBALAMIN) 100 MCG tablet Take 1 tablet (100 mcg total) by mouth daily. (Patient not taking: Reported on 04/25/2018) 30 tablet 1 Not Taking at Unknown time    Inpatient Medications:  . [MAR Hold] aspirin  325 mg Oral Daily  . [MAR Hold] atorvastatin  40 mg Oral q1800  . [MAR Hold] clopidogrel  300 mg Oral Once  . [MAR Hold] clopidogrel  75 mg Oral Daily  . [MAR Hold] enoxaparin (LOVENOX) injection  40 mg Subcutaneous Q24H  . [MAR Hold] gabapentin  300 mg Oral TID  . [MAR Hold] pantoprazole  40 mg Oral Daily  . [MAR Hold] ramelteon  8 mg Oral QHS    Allergies:  Allergies  Allergen Reactions  . Ace Inhibitors Other (See Comments)    Angioedema (throat swelling)  . Morphine Other (See Comments)    Causes confusion and "makes me crazy," per the patient  . Sulfa Antibiotics Other (See Comments)    Unknown reaction    Social History   Socioeconomic History  . Marital status: Married    Spouse name: Not on file  . Number of children: Not on file  . Years of education: Not on file  . Highest education level: Not on file  Occupational History  . Not on file  Social Needs  . Financial resource strain: Not on file  . Food insecurity:    Worry: Not on file    Inability: Not on file  . Transportation needs:    Medical: Not on file    Non-medical: Not on file  Tobacco Use  . Smoking status: Current Every Day Smoker    Types: E-cigarettes  . Smokeless tobacco: Former Systems developer    Types: Snuff  . Tobacco comment: does not use any tobacco products any more  Substance and Sexual Activity  . Alcohol use: Yes    Comment: occ  . Drug use: No  . Sexual activity: Never  Lifestyle  . Physical activity:    Days per week: Not on file    Minutes per session: Not on file  . Stress: Not on file  Relationships  . Social connections:    Talks on  phone: Not on file    Gets together: Not on file    Attends religious service: Not on file    Active member of club or organization: Not on file    Attends meetings of clubs or organizations: Not on file    Relationship status: Not on file  . Intimate partner  violence:    Fear of current or ex partner: Not on file    Emotionally abused: Not on file    Physically abused: Not on file    Forced sexual activity: Not on file  Other Topics Concern  . Not on file  Social History Narrative  . Not on file     Family History  Problem Relation Age of Onset  . Heart disease Father       Review of Systems: All other systems reviewed and are otherwise negative except as noted above.  Physical Exam: Vitals:   04/26/18 0801 04/26/18 1256 04/27/18 0426 04/27/18 0733  BP: 124/76 116/71 (!) 146/96 132/78  Pulse: (!) 58 71 62 65  Resp: 17 18 18 12   Temp: 97.8 F (36.6 C) 98 F (36.7 C) 97.9 F (36.6 C) 98.1 F (36.7 C)  TempSrc: Oral Oral Oral Oral  SpO2: 98% 99% 99% 98%  Weight:    83.9 kg  Height:    5\' 10"  (1.778 m)    GEN- The patient is well appearing, alert and oriented x 3 today, +confusion overnight.   Head- normocephalic, atraumatic Eyes-  Sclera clear, conjunctiva pink Ears- hearing intact Oropharynx- clear Neck- supple Lungs- Clear to ausculation bilaterally, normal work of breathing Heart- Regular rate and rhythm  GI- soft, NT, ND, + BS Extremities- no clubbing, cyanosis, or edema MS- no significant deformity or atrophy Skin- no rash or lesion Psych- euthymic mood, full affect   Labs:   Lab Results  Component Value Date   WBC 16.0 (H) 04/26/2018   HGB 11.6 (L) 04/26/2018   HCT 35.7 (L) 04/26/2018   MCV 89.0 04/26/2018   PLT 300 04/26/2018    Recent Labs  Lab 04/25/18 1243  04/26/18 0355  NA 128*   < > 134*  K 5.1   < > 4.8  CL 93*   < > 95*  CO2 25  --  28  BUN 30*   < > 19  CREATININE 1.37*   < > 1.02  CALCIUM 8.6*  --  8.8*  PROT 6.3*  --   --    BILITOT 0.5  --   --   ALKPHOS 74  --   --   ALT 17  --   --   AST 21  --   --   GLUCOSE 98   < > 89   < > = values in this interval not displayed.     Radiology/Studies: Ct Angio Head W Or Wo Contrast  Addendum Date: 04/25/2018   ADDENDUM REPORT: 04/25/2018 19:41 ADDENDUM: Cortical hypoattenuation along the right pre and postcentral gyrus is consistent with acute/subacute nonhemorrhagic infarct there is effacement adjacent sulci. Associated subacute arachnoid hemorrhage is considered less likely. This is likely all related to a subacute ischemic event. Findings were discussed with Dr. Lorraine Lax at 7:40 p.m. Electronically Signed   By: San Morelle M.D.   On: 04/25/2018 19:41   Result Date: 04/25/2018 CLINICAL DATA:  Left-sided weakness. Walking for 2 days. Cerebral aneurysm subarachnoid hemorrhage, cerebral vasospasm evaluation. EXAM: CT ANGIOGRAPHY HEAD AND NECK TECHNIQUE: Multidetector CT imaging of the head and neck was performed using the standard protocol during bolus administration of intravenous contrast. Multiplanar CT image reconstructions and MIPs were obtained to evaluate the vascular anatomy. Carotid stenosis measurements (when applicable) are obtained utilizing NASCET criteria, using the distal internal carotid diameter as the denominator. CONTRAST:  64mL ISOVUE-370 IOPAMIDOL (ISOVUE-370) INJECTION 76% COMPARISON:  Head without contrast 06/23/2017  FINDINGS: CT HEAD FINDINGS Brain: [name] matter hypoattenuation is present along the right pre and postcentral gyrus. No hemorrhage is present. There is no mass lesion. Ventricles proportionate to the degree of atrophy. Moderate diffuse white matter hypoattenuation is present bilaterally. Brainstem and cerebellum are otherwise normal. Vascular: Atherosclerotic calcifications are present within the cavernous internal carotid arteries bilaterally. There is hyperdense vessel. Skull: Calvarium is intact. No focal lytic or blastic lesions are  present. Sinuses: The paranasal sinuses and mastoid air cells are clear. Orbits: Globes and orbits are within bilaterally. Review of the MIP images confirms the above findings CTA NECK FINDINGS Aortic arch: There is a common origin of the innominate and left common carotid artery. Mild atherosclerotic changes are present. There is no significant stenosis the great vessel origins Right carotid system: The right common carotid artery is within normal limits proximally. Eccentric atherosclerotic changes are present in the distal right common carotid artery. Lumen is narrowed to 3.5 mm. There is no stenosis relative to the more distal vessel. Atherosclerotic calcifications are present at the carotid bifurcation without significant stenosis in the cervical ICA. Left carotid system: Left carotid artery is tortuous proximally. Atherosclerotic changes are present at the left carotid bifurcation. No significant stenosis relative the distal vessel. The cervical left ICA is normal. Vertebral arteries: The vertebral arteries are codominant. Both vertebral arteries originate from the subclavian arteries without significant stenosis. No significant injury or stenosis to either vertebral artery in the neck. Skeleton: Multilevel degenerative changes are present in the cervical spine. Grade 1 anterolisthesis is present at C3-4. There is chronic loss disc height with endplate uncovertebral disease at C4-5, C5-6, and C6-7. Osseous foraminal narrowing is greater right than left at C4-5 and C5-6. Other neck: The soft tissues of the neck are otherwise unremarkable. Salivary glands are within normal limits. No significant adenopathy is present. Thyroid is heterogeneous without a dominant lesion. No focal mucosal lesions are present. Upper chest: Paraseptal emphysematous changes are present along a disease. There is no focal nodule or mass lesion. Effusion is present. There is no pneumothorax. The thoracic inlet is within normal limits.  Review of the MIP images confirms the above findings CTA HEAD FINDINGS Anterior circulation: Atherosclerotic calcifications are present in the cavernous internal carotid arteries bilaterally. There is no significant stenosis from the skull base to the ICA termini. The A1 and M1 segments are normal. The anterior communicating artery is patent. MCA bifurcations are within normal limits. There is moderate attenuation of distal ACA and MCA branch vessels bilaterally without a significant proximal stenosis or occlusion Posterior circulation: The vertebral arteries are codominant. Atherosclerotic changes are present at the dural margin of the left vertebral artery without significant stenosis. Vertebrobasilar junction is normal. Both posterior cerebral arteries originate from the basilar tip. There is some attenuation of distal PCA branch vessels bilaterally without a significant proximal stenosis or occlusion. Venous sinuses: The dural sinuses are patent. Straight sinus and deep cerebral veins are intact. Cortical veins are within normal limits. Anatomic variants: None Delayed phase: The postcontrast images demonstrate no pathologic enhancement. Review of the MIP images confirms the above findings IMPRESSION: 1. Atherosclerotic changes within the mid and distal right common carotid artery narrows the lumen to 3.5 mm without a significant stenosis relative to the more distal vessel. 2. Atherosclerotic changes bilaterally at the carotid bifurcations without significant stenoses. 3. Atherosclerotic changes at the dural margin of the left vertebral artery without a significant stenosis. 4. Atherosclerotic changes of the cavernous internal carotid arteries bilaterally  without significant stenosis. 5. Distal medium and small vessel disease is present throughout the anterior and posterior circulation without a significant proximal stenosis, aneurysm, or branch vessel occlusion otherwise. 6. Multilevel degenerative changes of  the cervical spine as described above. Grade 1 anterolisthesis is present at C3-4. Electronically Signed: By: San Morelle M.D. On: 04/25/2018 14:58   Dg Chest 2 View  Result Date: 04/25/2018 CLINICAL DATA:  Elevated troponin.  Weakness. EXAM: CHEST - 2 VIEW COMPARISON:  April 18, 2018. FINDINGS: The heart size and mediastinal contours are within normal limits. Both lungs are clear. The visualized skeletal structures are stable. Spinal stimulator lead is unchanged. IMPRESSION: No active cardiopulmonary disease. Electronically Signed   By: Abelardo Diesel M.D.   On: 04/25/2018 14:38   Ct Head Wo Contrast  Result Date: 04/26/2018 CLINICAL DATA:  72 y/o M; stroke for follow-up. Left-sided weakness. EXAM: CT HEAD WITHOUT CONTRAST TECHNIQUE: Contiguous axial images were obtained from the base of the skull through the vertex without intravenous contrast. COMPARISON:  04/25/2018 CT head, CTA head, CTA neck. FINDINGS: Brain: Multiple small foci of cortical hypoattenuation within the right posterior occipital and parietal lobes are increased in conspicuity when compared with the prior CT of the head. No hemorrhage or mass effect. Streak artifact from dental hardware largely obscures the posterior fossa. No extra-axial collection, hydrocephalus, effacement of basilar cisterns, or herniation. Vascular: No hyperdense vessel or unexpected calcification. Skull: Normal. Negative for fracture or focal lesion. Sinuses/Orbits: No acute finding. Other: None. IMPRESSION: Increased conspicuity of multiple small cortical infarctions in the right posterior occipital and parietal lobes. No new stroke, hemorrhage, or mass effect identified. Electronically Signed   By: Kristine Garbe M.D.   On: 04/26/2018 01:53   Ct Angio Neck W And/or Wo Contrast  Addendum Date: 04/25/2018   ADDENDUM REPORT: 04/25/2018 19:41 ADDENDUM: Cortical hypoattenuation along the right pre and postcentral gyrus is consistent with  acute/subacute nonhemorrhagic infarct there is effacement adjacent sulci. Associated subacute arachnoid hemorrhage is considered less likely. This is likely all related to a subacute ischemic event. Findings were discussed with Dr. Lorraine Lax at 7:40 p.m. Electronically Signed   By: San Morelle M.D.   On: 04/25/2018 19:41   Result Date: 04/25/2018 CLINICAL DATA:  Left-sided weakness. Walking for 2 days. Cerebral aneurysm subarachnoid hemorrhage, cerebral vasospasm evaluation. EXAM: CT ANGIOGRAPHY HEAD AND NECK TECHNIQUE: Multidetector CT imaging of the head and neck was performed using the standard protocol during bolus administration of intravenous contrast. Multiplanar CT image reconstructions and MIPs were obtained to evaluate the vascular anatomy. Carotid stenosis measurements (when applicable) are obtained utilizing NASCET criteria, using the distal internal carotid diameter as the denominator. CONTRAST:  68mL ISOVUE-370 IOPAMIDOL (ISOVUE-370) INJECTION 76% COMPARISON:  Head without contrast 06/23/2017 FINDINGS: CT HEAD FINDINGS Brain: [name] matter hypoattenuation is present along the right pre and postcentral gyrus. No hemorrhage is present. There is no mass lesion. Ventricles proportionate to the degree of atrophy. Moderate diffuse white matter hypoattenuation is present bilaterally. Brainstem and cerebellum are otherwise normal. Vascular: Atherosclerotic calcifications are present within the cavernous internal carotid arteries bilaterally. There is hyperdense vessel. Skull: Calvarium is intact. No focal lytic or blastic lesions are present. Sinuses: The paranasal sinuses and mastoid air cells are clear. Orbits: Globes and orbits are within bilaterally. Review of the MIP images confirms the above findings CTA NECK FINDINGS Aortic arch: There is a common origin of the innominate and left common carotid artery. Mild atherosclerotic changes are present. There is no significant  stenosis the great vessel  origins Right carotid system: The right common carotid artery is within normal limits proximally. Eccentric atherosclerotic changes are present in the distal right common carotid artery. Lumen is narrowed to 3.5 mm. There is no stenosis relative to the more distal vessel. Atherosclerotic calcifications are present at the carotid bifurcation without significant stenosis in the cervical ICA. Left carotid system: Left carotid artery is tortuous proximally. Atherosclerotic changes are present at the left carotid bifurcation. No significant stenosis relative the distal vessel. The cervical left ICA is normal. Vertebral arteries: The vertebral arteries are codominant. Both vertebral arteries originate from the subclavian arteries without significant stenosis. No significant injury or stenosis to either vertebral artery in the neck. Skeleton: Multilevel degenerative changes are present in the cervical spine. Grade 1 anterolisthesis is present at C3-4. There is chronic loss disc height with endplate uncovertebral disease at C4-5, C5-6, and C6-7. Osseous foraminal narrowing is greater right than left at C4-5 and C5-6. Other neck: The soft tissues of the neck are otherwise unremarkable. Salivary glands are within normal limits. No significant adenopathy is present. Thyroid is heterogeneous without a dominant lesion. No focal mucosal lesions are present. Upper chest: Paraseptal emphysematous changes are present along a disease. There is no focal nodule or mass lesion. Effusion is present. There is no pneumothorax. The thoracic inlet is within normal limits. Review of the MIP images confirms the above findings CTA HEAD FINDINGS Anterior circulation: Atherosclerotic calcifications are present in the cavernous internal carotid arteries bilaterally. There is no significant stenosis from the skull base to the ICA termini. The A1 and M1 segments are normal. The anterior communicating artery is patent. MCA bifurcations are within  normal limits. There is moderate attenuation of distal ACA and MCA branch vessels bilaterally without a significant proximal stenosis or occlusion Posterior circulation: The vertebral arteries are codominant. Atherosclerotic changes are present at the dural margin of the left vertebral artery without significant stenosis. Vertebrobasilar junction is normal. Both posterior cerebral arteries originate from the basilar tip. There is some attenuation of distal PCA branch vessels bilaterally without a significant proximal stenosis or occlusion. Venous sinuses: The dural sinuses are patent. Straight sinus and deep cerebral veins are intact. Cortical veins are within normal limits. Anatomic variants: None Delayed phase: The postcontrast images demonstrate no pathologic enhancement. Review of the MIP images confirms the above findings IMPRESSION: 1. Atherosclerotic changes within the mid and distal right common carotid artery narrows the lumen to 3.5 mm without a significant stenosis relative to the more distal vessel. 2. Atherosclerotic changes bilaterally at the carotid bifurcations without significant stenoses. 3. Atherosclerotic changes at the dural margin of the left vertebral artery without a significant stenosis. 4. Atherosclerotic changes of the cavernous internal carotid arteries bilaterally without significant stenosis. 5. Distal medium and small vessel disease is present throughout the anterior and posterior circulation without a significant proximal stenosis, aneurysm, or branch vessel occlusion otherwise. 6. Multilevel degenerative changes of the cervical spine as described above. Grade 1 anterolisthesis is present at C3-4. Electronically Signed: By: San Morelle M.D. On: 04/25/2018 14:58    12-lead ECG sinus rhythm (personally reviewed) All prior EKG's in EPIC reviewed with no documented atrial fibrillation  Telemetry sinus rhythm (personally reviewed)  Assessment and Plan:  1. Cryptogenic  stroke The patient presents with cryptogenic stroke.  The patient has a TEE planned for this AM.  I spoke at length with the patient about monitoring for afib with an implantable loop recorder.  Risks, benefits,  and alteratives to implantable loop recorder were discussed with the patient today.   At this time, the patient is very clear in their decision to proceed with implantable loop recorder.   Wound care was reviewed with the patient (keep incision clean and dry for 3 days).  Wound check scheduled and entered in AVS.  Patient has had confusion this admission - discussed TEE and LINQ with wife by phone including risks, benefits. She agrees to proceed. Phone consent obtained.  2.  NSTEMI Plan for outpatient myoview per Dr C   Please call with questions.   Chanetta Marshall, NP 04/27/2018 7:51 AM  EP Attending  Patient seen and examined. Agree with the findings as noted above. The patient has had cryptogenic stroke and his TEE did not show the culprit. I have reviewed the risks/benefits/goals/expectations of ILR insertion and he wishes to proceed.  Mikle Bosworth.D.

## 2018-04-27 NOTE — Progress Notes (Addendum)
Patient returned to room from TEE w/ loop. Site cdi at this time. VSS. Family at bedside. No other distress noted.    Ave Filter, RN

## 2018-04-27 NOTE — Progress Notes (Signed)
Progress Note  Patient Name: William Jordan Date of Encounter: 04/27/2018  Primary Cardiologist: No primary care provider on file.   Subjective   Still sedated following his TEE, but readily responsive with stimulation.  Loop recorder implantation site looks healthy with him minimal oozing on the dressing. No evidence of cardioembolic source of his stroke by TEE. No reported chest pain overnight.  Inpatient Medications    Scheduled Meds: . aspirin  325 mg Oral Daily  . atorvastatin  40 mg Oral q1800  . clopidogrel  300 mg Oral Once  . clopidogrel  75 mg Oral Daily  . gabapentin  300 mg Oral TID  . heparin injection (subcutaneous)  5,000 Units Subcutaneous Q8H  . pantoprazole  40 mg Oral Daily  . ramelteon  8 mg Oral QHS   Continuous Infusions: . sodium chloride 10 mL/hr at 04/26/18 1123   PRN Meds: sodium chloride, acetaminophen, ondansetron (ZOFRAN) IV, oxyCODONE   Vital Signs    Vitals:   04/27/18 0910 04/27/18 0920 04/27/18 0930 04/27/18 1048  BP: 110/72 106/62 105/60 125/68  Pulse: 69 66 66 73  Resp: 15 14 13 18   Temp:    97.9 F (36.6 C)  TempSrc:    Axillary  SpO2: 96% 94% 95% 99%  Weight:      Height:        Intake/Output Summary (Last 24 hours) at 04/27/2018 1125 Last data filed at 04/27/2018 0634 Gross per 24 hour  Intake 780 ml  Output 2350 ml  Net -1570 ml   Filed Weights   04/25/18 1219 04/27/18 0733  Weight: 83.9 kg 83.9 kg    Telemetry    NSR - Personally Reviewed  ECG    No new tracing - Personally Reviewed  Physical Exam  sleepy GEN: No acute distress.   Neck: No JVD Cardiac: RRR, no murmurs, rubs, or gallops.  Respiratory: Clear to auscultation bilaterally. GI: Soft, nontender, non-distended  MS: No edema; No deformity. Neuro:  left hemiparesis Psych: Normal affect   Labs    Chemistry Recent Labs  Lab 04/25/18 1243 04/25/18 1305 04/26/18 0355  NA 128* 127* 134*  K 5.1 5.0 4.8  CL 93* 92* 95*  CO2 25  --  28    GLUCOSE 98 103* 89  BUN 30* 36* 19  CREATININE 1.37* 1.30* 1.02  CALCIUM 8.6*  --  8.8*  PROT 6.3*  --   --   ALBUMIN 3.5  --   --   AST 21  --   --   ALT 17  --   --   ALKPHOS 74  --   --   BILITOT 0.5  --   --   GFRNONAA 51*  --  >60  GFRAA 59*  --  >60  ANIONGAP 10  --  11     Hematology Recent Labs  Lab 04/25/18 1243 04/25/18 1305 04/26/18 0355  WBC 21.2*  --  16.0*  RBC 3.90*  --  4.01*  HGB 11.1* 11.9* 11.6*  HCT 35.2* 35.0* 35.7*  MCV 90.3  --  89.0  MCH 28.5  --  28.9  MCHC 31.5  --  32.5  RDW 14.5  --  14.5  PLT 330  --  300    Cardiac Enzymes Recent Labs  Lab 04/25/18 1854 04/26/18 0118 04/26/18 0355  TROPONINI 2.21* 3.09* 2.83*    Recent Labs  Lab 04/25/18 1303  TROPIPOC 0.22*     BNPNo results for input(s): BNP, PROBNP  in the last 168 hours.   DDimer No results for input(s): DDIMER in the last 168 hours.   Radiology    Ct Angio Head W Or Wo Contrast  Addendum Date: 04/25/2018   ADDENDUM REPORT: 04/25/2018 19:41 ADDENDUM: Cortical hypoattenuation along the right pre and postcentral gyrus is consistent with acute/subacute nonhemorrhagic infarct there is effacement adjacent sulci. Associated subacute arachnoid hemorrhage is considered less likely. This is likely all related to a subacute ischemic event. Findings were discussed with Dr. Lorraine Lax at 7:40 p.m. Electronically Signed   By: San Morelle M.D.   On: 04/25/2018 19:41   Result Date: 04/25/2018 CLINICAL DATA:  Left-sided weakness. Walking for 2 days. Cerebral aneurysm subarachnoid hemorrhage, cerebral vasospasm evaluation. EXAM: CT ANGIOGRAPHY HEAD AND NECK TECHNIQUE: Multidetector CT imaging of the head and neck was performed using the standard protocol during bolus administration of intravenous contrast. Multiplanar CT image reconstructions and MIPs were obtained to evaluate the vascular anatomy. Carotid stenosis measurements (when applicable) are obtained utilizing NASCET criteria,  using the distal internal carotid diameter as the denominator. CONTRAST:  64mL ISOVUE-370 IOPAMIDOL (ISOVUE-370) INJECTION 76% COMPARISON:  Head without contrast 06/23/2017 FINDINGS: CT HEAD FINDINGS Brain: [name] matter hypoattenuation is present along the right pre and postcentral gyrus. No hemorrhage is present. There is no mass lesion. Ventricles proportionate to the degree of atrophy. Moderate diffuse white matter hypoattenuation is present bilaterally. Brainstem and cerebellum are otherwise normal. Vascular: Atherosclerotic calcifications are present within the cavernous internal carotid arteries bilaterally. There is hyperdense vessel. Skull: Calvarium is intact. No focal lytic or blastic lesions are present. Sinuses: The paranasal sinuses and mastoid air cells are clear. Orbits: Globes and orbits are within bilaterally. Review of the MIP images confirms the above findings CTA NECK FINDINGS Aortic arch: There is a common origin of the innominate and left common carotid artery. Mild atherosclerotic changes are present. There is no significant stenosis the great vessel origins Right carotid system: The right common carotid artery is within normal limits proximally. Eccentric atherosclerotic changes are present in the distal right common carotid artery. Lumen is narrowed to 3.5 mm. There is no stenosis relative to the more distal vessel. Atherosclerotic calcifications are present at the carotid bifurcation without significant stenosis in the cervical ICA. Left carotid system: Left carotid artery is tortuous proximally. Atherosclerotic changes are present at the left carotid bifurcation. No significant stenosis relative the distal vessel. The cervical left ICA is normal. Vertebral arteries: The vertebral arteries are codominant. Both vertebral arteries originate from the subclavian arteries without significant stenosis. No significant injury or stenosis to either vertebral artery in the neck. Skeleton: Multilevel  degenerative changes are present in the cervical spine. Grade 1 anterolisthesis is present at C3-4. There is chronic loss disc height with endplate uncovertebral disease at C4-5, C5-6, and C6-7. Osseous foraminal narrowing is greater right than left at C4-5 and C5-6. Other neck: The soft tissues of the neck are otherwise unremarkable. Salivary glands are within normal limits. No significant adenopathy is present. Thyroid is heterogeneous without a dominant lesion. No focal mucosal lesions are present. Upper chest: Paraseptal emphysematous changes are present along a disease. There is no focal nodule or mass lesion. Effusion is present. There is no pneumothorax. The thoracic inlet is within normal limits. Review of the MIP images confirms the above findings CTA HEAD FINDINGS Anterior circulation: Atherosclerotic calcifications are present in the cavernous internal carotid arteries bilaterally. There is no significant stenosis from the skull base to the ICA termini. The A1  and M1 segments are normal. The anterior communicating artery is patent. MCA bifurcations are within normal limits. There is moderate attenuation of distal ACA and MCA branch vessels bilaterally without a significant proximal stenosis or occlusion Posterior circulation: The vertebral arteries are codominant. Atherosclerotic changes are present at the dural margin of the left vertebral artery without significant stenosis. Vertebrobasilar junction is normal. Both posterior cerebral arteries originate from the basilar tip. There is some attenuation of distal PCA branch vessels bilaterally without a significant proximal stenosis or occlusion. Venous sinuses: The dural sinuses are patent. Straight sinus and deep cerebral veins are intact. Cortical veins are within normal limits. Anatomic variants: None Delayed phase: The postcontrast images demonstrate no pathologic enhancement. Review of the MIP images confirms the above findings IMPRESSION: 1.  Atherosclerotic changes within the mid and distal right common carotid artery narrows the lumen to 3.5 mm without a significant stenosis relative to the more distal vessel. 2. Atherosclerotic changes bilaterally at the carotid bifurcations without significant stenoses. 3. Atherosclerotic changes at the dural margin of the left vertebral artery without a significant stenosis. 4. Atherosclerotic changes of the cavernous internal carotid arteries bilaterally without significant stenosis. 5. Distal medium and small vessel disease is present throughout the anterior and posterior circulation without a significant proximal stenosis, aneurysm, or branch vessel occlusion otherwise. 6. Multilevel degenerative changes of the cervical spine as described above. Grade 1 anterolisthesis is present at C3-4. Electronically Signed: By: San Morelle M.D. On: 04/25/2018 14:58   Dg Chest 2 View  Result Date: 04/25/2018 CLINICAL DATA:  Elevated troponin.  Weakness. EXAM: CHEST - 2 VIEW COMPARISON:  April 18, 2018. FINDINGS: The heart size and mediastinal contours are within normal limits. Both lungs are clear. The visualized skeletal structures are stable. Spinal stimulator lead is unchanged. IMPRESSION: No active cardiopulmonary disease. Electronically Signed   By: Abelardo Diesel M.D.   On: 04/25/2018 14:38   Ct Head Wo Contrast  Result Date: 04/26/2018 CLINICAL DATA:  72 y/o M; stroke for follow-up. Left-sided weakness. EXAM: CT HEAD WITHOUT CONTRAST TECHNIQUE: Contiguous axial images were obtained from the base of the skull through the vertex without intravenous contrast. COMPARISON:  04/25/2018 CT head, CTA head, CTA neck. FINDINGS: Brain: Multiple small foci of cortical hypoattenuation within the right posterior occipital and parietal lobes are increased in conspicuity when compared with the prior CT of the head. No hemorrhage or mass effect. Streak artifact from dental hardware largely obscures the posterior fossa.  No extra-axial collection, hydrocephalus, effacement of basilar cisterns, or herniation. Vascular: No hyperdense vessel or unexpected calcification. Skull: Normal. Negative for fracture or focal lesion. Sinuses/Orbits: No acute finding. Other: None. IMPRESSION: Increased conspicuity of multiple small cortical infarctions in the right posterior occipital and parietal lobes. No new stroke, hemorrhage, or mass effect identified. Electronically Signed   By: Kristine Garbe M.D.   On: 04/26/2018 01:53   Ct Angio Neck W And/or Wo Contrast  Addendum Date: 04/25/2018   ADDENDUM REPORT: 04/25/2018 19:41 ADDENDUM: Cortical hypoattenuation along the right pre and postcentral gyrus is consistent with acute/subacute nonhemorrhagic infarct there is effacement adjacent sulci. Associated subacute arachnoid hemorrhage is considered less likely. This is likely all related to a subacute ischemic event. Findings were discussed with Dr. Lorraine Lax at 7:40 p.m. Electronically Signed   By: San Morelle M.D.   On: 04/25/2018 19:41   Result Date: 04/25/2018 CLINICAL DATA:  Left-sided weakness. Walking for 2 days. Cerebral aneurysm subarachnoid hemorrhage, cerebral vasospasm evaluation. EXAM: CT ANGIOGRAPHY HEAD  AND NECK TECHNIQUE: Multidetector CT imaging of the head and neck was performed using the standard protocol during bolus administration of intravenous contrast. Multiplanar CT image reconstructions and MIPs were obtained to evaluate the vascular anatomy. Carotid stenosis measurements (when applicable) are obtained utilizing NASCET criteria, using the distal internal carotid diameter as the denominator. CONTRAST:  69mL ISOVUE-370 IOPAMIDOL (ISOVUE-370) INJECTION 76% COMPARISON:  Head without contrast 06/23/2017 FINDINGS: CT HEAD FINDINGS Brain: [name] matter hypoattenuation is present along the right pre and postcentral gyrus. No hemorrhage is present. There is no mass lesion. Ventricles proportionate to the degree of  atrophy. Moderate diffuse white matter hypoattenuation is present bilaterally. Brainstem and cerebellum are otherwise normal. Vascular: Atherosclerotic calcifications are present within the cavernous internal carotid arteries bilaterally. There is hyperdense vessel. Skull: Calvarium is intact. No focal lytic or blastic lesions are present. Sinuses: The paranasal sinuses and mastoid air cells are clear. Orbits: Globes and orbits are within bilaterally. Review of the MIP images confirms the above findings CTA NECK FINDINGS Aortic arch: There is a common origin of the innominate and left common carotid artery. Mild atherosclerotic changes are present. There is no significant stenosis the great vessel origins Right carotid system: The right common carotid artery is within normal limits proximally. Eccentric atherosclerotic changes are present in the distal right common carotid artery. Lumen is narrowed to 3.5 mm. There is no stenosis relative to the more distal vessel. Atherosclerotic calcifications are present at the carotid bifurcation without significant stenosis in the cervical ICA. Left carotid system: Left carotid artery is tortuous proximally. Atherosclerotic changes are present at the left carotid bifurcation. No significant stenosis relative the distal vessel. The cervical left ICA is normal. Vertebral arteries: The vertebral arteries are codominant. Both vertebral arteries originate from the subclavian arteries without significant stenosis. No significant injury or stenosis to either vertebral artery in the neck. Skeleton: Multilevel degenerative changes are present in the cervical spine. Grade 1 anterolisthesis is present at C3-4. There is chronic loss disc height with endplate uncovertebral disease at C4-5, C5-6, and C6-7. Osseous foraminal narrowing is greater right than left at C4-5 and C5-6. Other neck: The soft tissues of the neck are otherwise unremarkable. Salivary glands are within normal limits. No  significant adenopathy is present. Thyroid is heterogeneous without a dominant lesion. No focal mucosal lesions are present. Upper chest: Paraseptal emphysematous changes are present along a disease. There is no focal nodule or mass lesion. Effusion is present. There is no pneumothorax. The thoracic inlet is within normal limits. Review of the MIP images confirms the above findings CTA HEAD FINDINGS Anterior circulation: Atherosclerotic calcifications are present in the cavernous internal carotid arteries bilaterally. There is no significant stenosis from the skull base to the ICA termini. The A1 and M1 segments are normal. The anterior communicating artery is patent. MCA bifurcations are within normal limits. There is moderate attenuation of distal ACA and MCA branch vessels bilaterally without a significant proximal stenosis or occlusion Posterior circulation: The vertebral arteries are codominant. Atherosclerotic changes are present at the dural margin of the left vertebral artery without significant stenosis. Vertebrobasilar junction is normal. Both posterior cerebral arteries originate from the basilar tip. There is some attenuation of distal PCA branch vessels bilaterally without a significant proximal stenosis or occlusion. Venous sinuses: The dural sinuses are patent. Straight sinus and deep cerebral veins are intact. Cortical veins are within normal limits. Anatomic variants: None Delayed phase: The postcontrast images demonstrate no pathologic enhancement. Review of the MIP images confirms  the above findings IMPRESSION: 1. Atherosclerotic changes within the mid and distal right common carotid artery narrows the lumen to 3.5 mm without a significant stenosis relative to the more distal vessel. 2. Atherosclerotic changes bilaterally at the carotid bifurcations without significant stenoses. 3. Atherosclerotic changes at the dural margin of the left vertebral artery without a significant stenosis. 4.  Atherosclerotic changes of the cavernous internal carotid arteries bilaterally without significant stenosis. 5. Distal medium and small vessel disease is present throughout the anterior and posterior circulation without a significant proximal stenosis, aneurysm, or branch vessel occlusion otherwise. 6. Multilevel degenerative changes of the cervical spine as described above. Grade 1 anterolisthesis is present at C3-4. Electronically Signed: By: San Morelle M.D. On: 04/25/2018 14:58    Cardiac Studies   TEE today Findings: Please see echo section for full report.  Normal LV size and systolic function, EF 56-81%. Mild LV hypertrophy.  Normal RV size and systolic function.  Normal right atrial size.  Normal left atrial size, no LA appendage thrombus. No PFO or ASD, negative bubble study. No significant TR.  Trivial MR.  Trileaflet aortic valve without stenosis or regurgitation.  Normal caliber thoracic aorta with grade 3 plaque in descending thoracic aorta.   Patient Profile     72 y.o. male with HTN, chronic back pain on opiates, admitted with L hemiparesis due to ischemic stroke, suspected embolic, also with transient chest pain and NSVT, enzymes and ECG suspicious for recent posterior MI, but without wall motion abnormalities, normal EF.  Assessment & Plan    1. CVA: will need rehab. Plan additional CAD workup after completion of rehab. On ASA and clopidogrel. At this point, primary culprit suspect is R common carotid ulcerated plaque.  2. NSTEMI:  No current angina, arrhythmia resolved, preserved LVEF. Will risk stratify with an OP Lexiscan Myoview after rehab is completed. He is on good medical Rx, with DAPT, statin (LDL 72), good BP control. Low HR precludes beta blocker. 3. S/p ILR:  Wound check scheduled 8/26, but OK to check in rehab facility. Discussed monitor setup with his wife.   [facility] will sign off.   Medication Recommendations:  Continue meds as currently  prescribed Other recommendations (labs, testing, etc):  Lexiscan Myoview in 3-4 weeks, we will schedule Follow up as an outpatient:  Follow up in a month, after Myoview.  For questions or updates, please contact Interior Please consult [email or web address] for contact info under Cardiology/STEMI.      Signed, Sanda Klein, MD  04/27/2018, 11:25 AM

## 2018-04-27 NOTE — Progress Notes (Signed)
STROKE TEAM PROGRESS NOTE   SUBJECTIVE (INTERVAL HISTORY) Wife and sitter are at the bedside.  He is lying in bed. Had delirium and confusion last night and now resolved. Had TEE unremarkable and loop recorder placed. Repeat CT confirmed right MCA infarcts.    OBJECTIVE Vitals:   04/27/18 0930 04/27/18 1048 04/27/18 1346 04/27/18 1554  BP: 105/60 125/68 (!) 121/54 113/73  Pulse: 66 73 79 75  Resp: 13 18 16 18   Temp:  97.9 F (36.6 C)  98.3 F (36.8 C)  TempSrc:  Axillary  Oral  SpO2: 95% 99%    Weight:      Height:        CBC:  Recent Labs  Lab 04/25/18 1243  04/26/18 0355 04/27/18 1059  WBC 21.2*  --  16.0* 15.5*  NEUTROABS 15.5*  --   --   --   HGB 11.1*   < > 11.6* 12.5*  HCT 35.2*   < > 35.7* 38.3*  MCV 90.3  --  89.0 88.7  PLT 330  --  300 324   < > = values in this interval not displayed.    Basic Metabolic Panel:  Recent Labs  Lab 04/25/18 1243 04/25/18 1305 04/26/18 0355 04/27/18 1059  NA 128* 127* 134*  --   K 5.1 5.0 4.8  --   CL 93* 92* 95*  --   CO2 25  --  28  --   GLUCOSE 98 103* 89  --   BUN 30* 36* 19  --   CREATININE 1.37* 1.30* 1.02 0.99  CALCIUM 8.6*  --  8.8*  --   MG 1.9  --   --   --     Lipid Panel:     Component Value Date/Time   CHOL 157 04/26/2018 0355   TRIG 123 04/26/2018 0355   HDL 60 04/26/2018 0355   CHOLHDL 2.6 04/26/2018 0355   VLDL 25 04/26/2018 0355   LDLCALC 72 04/26/2018 0355   HgbA1c:  Lab Results  Component Value Date   HGBA1C 5.8 (H) 04/26/2018   Urine Drug Screen:     Component Value Date/Time   LABOPIA POSITIVE (A) 04/25/2018 2122   COCAINSCRNUR NONE DETECTED 04/25/2018 2122   LABBENZ NONE DETECTED 04/25/2018 2122   AMPHETMU NONE DETECTED 04/25/2018 2122   THCU NONE DETECTED 04/25/2018 2122   LABBARB NONE DETECTED 04/25/2018 2122    Alcohol Level     Component Value Date/Time   ETH <10 12/10/2017 1607    IMAGING I have personally reviewed the radiological images below and agree with the  radiology interpretations.  Ct Angio Head W Or Wo Contrast Ct Angio Neck W And/or Wo Contrast 04/25/2018   ADDENDUM  Cortical hypoattenuation along the right pre and postcentral gyrus is consistent with acute/subacute nonhemorrhagic infarct there is effacement adjacent sulci.  Associated subacute arachnoid hemorrhage is considered less likely.  This is likely all related to a subacute ischemic event.   IMPRESSION:  1. Atherosclerotic changes within the mid and distal right common carotid artery narrows the lumen to 3.5 mm without a significant stenosis relative to the more distal vessel.  2. Atherosclerotic changes bilaterally at the carotid bifurcations without significant stenoses.  3. Atherosclerotic changes at the dural margin of the left vertebral artery without a significant stenosis.  4. Atherosclerotic changes of the cavernous internal carotid arteries bilaterally without significant stenosis.  5. Distal medium and small vessel disease is present throughout the anterior and posterior circulation without a  significant proximal stenosis, aneurysm, or branch vessel occlusion otherwise.  6. Multilevel degenerative changes of the cervical spine as described above. Grade 1 anterolisthesis is present at C3-4.   Ct Head Wo Contrast 04/26/2018 IMPRESSION:  Increased conspicuity of multiple small cortical infarctions in the right posterior occipital and parietal lobes.  No new stroke, hemorrhage, or mass effect identified.   EEG 04/26/2018 IMPRESSION: This is an abnormal EEG secondary to posterior background slowing. This finding may be seen with a diffuse gray matter disturbance that is etiologically nonspecific, but may include a dementia, among other possibilities.  No epileptiform activity is noted.    Transthoracic Echocardiogram - Left ventricle: The cavity size was normal. Wall thickness was   increased in a pattern of mild LVH. Systolic function was normal.   The estimated ejection  fraction was in the range of 60% to 65%.  LE venous doppler There is no DVT or SVT noted in the bilateral lower extremities.   TEE Normal LV size and systolic function, EF 48-18%. Mild LV hypertrophy.  Normal RV size and systolic function.  Normal right atrial size.  Normal left atrial size, no LA appendage thrombus. No PFO or ASD, negative bubble study. No significant TR.  Trivial MR.  Trileaflet aortic valve without stenosis or regurgitation.  Normal caliber thoracic aorta with grade 3 plaque in descending thoracic aorta.   Ct Head Wo Contrast 04/27/2018 IMPRESSION: 1. No acute hemorrhage. 2. Unchanged appearance of multiple areas of acute to subacute ischemia in the right parietal and occipital lobes.    PHYSICAL EXAM  Temp:  [97.9 F (36.6 C)-98.3 F (36.8 C)] 98.3 F (36.8 C) (08/13 1554) Pulse Rate:  [62-79] 75 (08/13 1554) Resp:  [12-26] 18 (08/13 1554) BP: (99-146)/(54-96) 113/73 (08/13 1554) SpO2:  [94 %-100 %] 99 % (08/13 1048) Weight:  [83.9 kg] 83.9 kg (08/13 0733)  General - Well nourished, well developed, in no apparent distress.  Ophthalmologic - fundi not visualized due to noncooperation.  Cardiovascular - Regular rate and rhythm.  Mental Status -  Level of arousal and orientation to time, place, and person were intact. Language including expression, naming, repetition, comprehension was assessed and found intact.  Cranial Nerves II - XII - II - Visual field intact OU. III, IV, VI - Extraocular movements intact. V - Facial sensation intact bilaterally. VII - Facial movement intact bilaterally. VIII - Hearing & vestibular intact bilaterally. X - Palate elevates symmetrically. XI - Chin turning & shoulder shrug intact bilaterally. XII - Tongue protrusion intact.  Motor Strength - The patient's strength was normal in all extremities except LUE 4-/5 deltoid, 4+/5 bicep and tricep, 3/5 finger grip and pronator drift was present on the left. Intermittent sharp  pain at left shoulder, chronic. Bulk was normal and fasciculations were absent.   Motor Tone - Muscle tone was assessed at the neck and appendages and was normal.  Reflexes - The patient's reflexes were symmetrical in all extremities and he had no pathological reflexes.  Sensory - Light touch, temperature/pinprick were assessed and were symmetrical.    Coordination - The patient had normal movements in the hands with no ataxia or dysmetria.  Tremor was absent.  Gait and Station - deferred.    ASSESSMENT/PLAN [name] is a 72 y.o. male with history of anxiety, HTN, idiopathic peripheral neuropathy with nerve stimulator, and chronic pain (under pain management, opoid abuse) presenting with Lt sided weakness, sensory decline, transient L eye visual disturbance, intermittent right arm jerking  concerning for focal status epilepticus, and chest pain associated with NSVT. Discharged from [facility] Sunday for treatment of angioedema from ACE I. He did not receive IV t-PA due to late presentation.   Stroke: Rt MCA frontal and occipital infarcts - embolic pattern, could be due to ulcerative large soft plaque right CCA vs. cardioembolic source.  Resultant  Left hand weakness  CT head - No new stroke, hemorrhage, or mass effect identified. Multiple old infarcts.  MRI head - not able to perform due to Spinal stimulator  CT repeat 04/26/18- Cortical hypoattenuation along the right pre and postcentral gyrus c/w acute/subacute infarct  CT repeat 04/27/18 confirmed right MCA frontal and occipital patchy infarcts  CTA head and neck - right mid CCA large soft plaque with surface ulceration  Carotid Doppler - CTA neck performed or pending - carotid dopplers not indicated.  2D Echo - EF 60-65%  EEG - abnormal EEG secondary to posterior background slowing.   LE venous doppler no DVT  TEE EF 55-60%, no PFO  Loop recorder placed  LDL - 72  HgbA1c - 5.8  UDS negative  VTE  prophylaxis - Lovenox  Diet - regular with thin liquids  No antithrombotic prior to admission, now on aspirin 325 mg daily and clopidogrel 75 mg daily. Continue DAPT for 3 months and then plavix alone.   Patient counseled to be compliant with his antithrombotic medications  Ongoing aggressive stroke risk factor management  Therapy recommendations:  pending  Disposition:  Pending  Right CCA stenosis  Soft plaque with ulceration vs. Thrombus at mid CCA on CTA  On DAPT  TEE EF 55-60%, no PFO, no thrombus  loop placed  Consider anticoagulation if fluctuating symptoms  Transient V-tach  Associated with chest pain  Shown in tele monitoring in ER  Was put on amiodarone IV  Cardiology on board  Loop placed  ? Seizure-like activity ?  Left shoulder jerking  Was loaded with fosphenytoin  Was on keppra bid  EEG no seizure  No left shoulder jerking overnight - pt has left shoulder pain chronic   AED discontinued  Continue monitoring  Seizure precaution  Hypertension  BP mildly low at times. . Permissive hypertension (OK if < 220/120) but gradually normalize in 5-7 days . Long-term BP goal normotensive  Hyperlipidemia  Lipid lowering medication PTA:  none  LDL 72, goal < 70  Current lipid lowering medication: Lipitor 40 mg daily  Continue statin at discharge  Tobacco abuse  Current smoker  Smoking cessation counseling provided  Pt is willing to quit  Other Stroke Risk Factors  Advanced age  ETOH use, advised to drink no more than 1 alcoholic beverage per day.  Hx of stroke by imaging - Multiple old lacunar infarcts.  Other Active Problems  Recent angioedema from ACEI  Bradycardia  Leukocytosis WBC 21.2->16.0->15.5 (? If pt received steroids for treatment of angioedema)   Hyponatremia Na 127->134  Chronic pain - opiods  Hospital day # 2  Neurology will sign off. Please call with questions. Pt will follow up with stroke clinic NP  at [facility] in about 4 weeks. Thanks for the consult.   Rosalin Hawking, MD PhD Stroke Neurology 04/27/2018 4:02 PM     To contact Stroke Continuity provider, please refer to [email or web address] After hours, contact General Neurology

## 2018-04-27 NOTE — Consult Note (Addendum)
   [facility] CM Inpatient Consult   04/27/2018  BADR PIEDRA 1945-10-16 [id number]     Patient screened for potential [facility] Care Management services. Spoke with inpatient RNCM. Informed of patient having TEE today. Will continue to follow along and engage for [place] Management services at later time.    Marthenia Rolling, MSN-Ed, RN,BSN [facility] Liaison [phone]

## 2018-04-27 NOTE — Progress Notes (Signed)
Subjective: Patient was doing well today, tolerated the procedures well. Was resting comfortably. He is feeling a lot better today.  Night team was paged that patient was pulling of his telemetry leads last night and wanted to leave, when they evaluated he appeared comfortable and oriented x4, no aggressive behavior was noted. He was given some ramelteon to help him sleep.  Spoke with wife and answered questions about the inpatient rehab, told her that he will be evaluated to assess if he qualifies for this.    Objective:  Vital signs in last 24 hours: Vitals:   04/26/18 0634 04/26/18 0801 04/26/18 1256 04/27/18 0426  BP:  124/76 116/71 (!) 146/96  Pulse: 60 (!) 58 71 62  Resp: 17 17 18 18   Temp: 98.3 F (36.8 C) 97.8 F (36.6 C) 98 F (36.7 C) 97.9 F (36.6 C)  TempSrc: Axillary Oral Oral Oral  SpO2:  98% 99% 99%  Weight:      Height:        General: Well nourished, well appearing, NAD  HEENT: Normocephalic, nontraumatic, midline trachea Cardiac: RRR, normal S1, S2, no murmurs, rubs or gallops  Pulmonary: Lungs CTA bilaterally, no wheezing, rhonchi or rales  Abdomen: Soft, non-tender, +bowel sounds, no guarding or masses noted  Extremity: No LE edema, no muscle atrophy, no lesions or wounds noted  Neuro: Alert and oriented x3, PERRLA, EOMI, 5/5 strength, 4/5 in left hand grip, moves all extremities Psychiatry: Normal mood and affect    Assessment/Plan: This is a 72 year old male with a PMH of HTN, chronic back pain, idiopathic peripheral neuropathy (with nerve stimulator) who presented with left arm and leg weakness, also had an episode of chest pain in the ER with a 5-6 beat run of NSVT.  Right posterior and occipital CVA: Presented with left arm and leg weakness. Found to have a right occipital and posterior infarcts on imaging. MRI was unable to be done due to TENS unit. CTA head and neck showed right mid CCA large soft plaque with surface ulceration. Neurologoy is  following and will get an TEE and loop recorder placed. PT recommended CIR evaluation.  -Continue ASA 325 -Continue Clopidogrel 75mg  daily -Loop recorder was placed -Had a TEE that showed normal EF 55-60%, with no source of an embolus.  -CIR evaluation  Rhythmic jerking motion of left arm:  Patients left arm had rhythmic jerking around when he was admitted, it had started that day. It was possible that this was due to a focal status epilepticus and neurology started him on keppra and ordered and EEG. EEG showed no signs of epileptiform activity and his symptoms resolved overnight. He did get his remote for the TENS unit and made some adjustments to it. Neurology discontinued the AEDS. This was possibly due to his TENs unit but it's unclear.  -Seizure precautions  NSTEMI: Patient had one episode of chest pain while in the ED, was sharp in nature. Had an episode fo 5-6 beat NSVT around that time. Was started on amiodarone drip by ED. Troponin was 0.22, EKG showed some elevated ST elevations in inferior leads, isoelectric to TP segment. Amiodarone drip was stopped. Troponin's increased to 2.21 and 3.09, cardiology was notified and since EKG did not show deep T wave inversion or ST elevation with tombstone they did not recommend intervention.  -Cardiology on board and will do an outpatient lexiscan myoview after rehab is completed -Has loop recorder in place and has wound check on 8/26 -ASA 325 -  Lipitor 40 mg daily -CK was 166 -Follow up with cardiology outpatient  AKI: On admission BUN 30, Cr 1.37, elevated from base line. 04/26/18 BUN 19 and Cr 1.02.   HTM:[id number] on exam. On amlodipine at home.  -Hold home medications  Chronic back pain Idiopathic peripheral neuropathy:Has a nerve stimulator in the back. He states that he has it always on.  -Oxycodone 20mg  q4 hours for pain -Continue gabapentin  GERD: Pantoprazole  FEN:No fluids, replete lytes prn,regular VTE FQH:KUVJ Code  Status: FULL  Dispo: Anticipated discharge in approximately 1-2 day(s).   Asencion Noble, MD 04/27/2018, 6:24 AM Pager: [phone]

## 2018-04-28 ENCOUNTER — Inpatient Hospital Stay (HOSPITAL_COMMUNITY)
Admission: RE | Admit: 2018-04-28 | Discharge: 2018-05-03 | DRG: 056 | Disposition: A | Discharge status: Home / Self Care | Payer: PPO | Source: Intra-hospital | Attending: Physical Medicine & Rehabilitation | Admitting: Physical Medicine & Rehabilitation

## 2018-04-28 ENCOUNTER — Encounter (HOSPITAL_COMMUNITY): Payer: Self-pay | Admitting: *Deleted

## 2018-04-28 ENCOUNTER — Other Ambulatory Visit: Payer: Self-pay

## 2018-04-28 DIAGNOSIS — G8929 Other chronic pain: Secondary | ICD-10-CM | POA: Diagnosis not present

## 2018-04-28 DIAGNOSIS — G609 Hereditary and idiopathic neuropathy, unspecified: Secondary | ICD-10-CM | POA: Diagnosis present

## 2018-04-28 DIAGNOSIS — I69398 Other sequelae of cerebral infarction: Secondary | ICD-10-CM | POA: Diagnosis not present

## 2018-04-28 DIAGNOSIS — Z7982 Long term (current) use of aspirin: Secondary | ICD-10-CM | POA: Diagnosis not present

## 2018-04-28 DIAGNOSIS — F5102 Adjustment insomnia: Secondary | ICD-10-CM | POA: Diagnosis not present

## 2018-04-28 DIAGNOSIS — I25119 Atherosclerotic heart disease of native coronary artery with unspecified angina pectoris: Secondary | ICD-10-CM | POA: Diagnosis not present

## 2018-04-28 DIAGNOSIS — I214 Non-ST elevation (NSTEMI) myocardial infarction: Secondary | ICD-10-CM | POA: Diagnosis not present

## 2018-04-28 DIAGNOSIS — I110 Hypertensive heart disease with heart failure: Secondary | ICD-10-CM | POA: Diagnosis not present

## 2018-04-28 DIAGNOSIS — M1990 Unspecified osteoarthritis, unspecified site: Secondary | ICD-10-CM | POA: Diagnosis not present

## 2018-04-28 DIAGNOSIS — I237 Postinfarction angina: Secondary | ICD-10-CM

## 2018-04-28 DIAGNOSIS — I5032 Chronic diastolic (congestive) heart failure: Secondary | ICD-10-CM | POA: Diagnosis not present

## 2018-04-28 DIAGNOSIS — Z882 Allergy status to sulfonamides status: Secondary | ICD-10-CM | POA: Diagnosis not present

## 2018-04-28 DIAGNOSIS — Z8249 Family history of ischemic heart disease and other diseases of the circulatory system: Secondary | ICD-10-CM | POA: Diagnosis not present

## 2018-04-28 DIAGNOSIS — G629 Polyneuropathy, unspecified: Secondary | ICD-10-CM | POA: Diagnosis not present

## 2018-04-28 DIAGNOSIS — I209 Angina pectoris, unspecified: Secondary | ICD-10-CM | POA: Diagnosis present

## 2018-04-28 DIAGNOSIS — Z955 Presence of coronary angioplasty implant and graft: Secondary | ICD-10-CM | POA: Diagnosis not present

## 2018-04-28 DIAGNOSIS — Z96641 Presence of right artificial hip joint: Secondary | ICD-10-CM | POA: Diagnosis not present

## 2018-04-28 DIAGNOSIS — Z79899 Other long term (current) drug therapy: Secondary | ICD-10-CM | POA: Diagnosis not present

## 2018-04-28 DIAGNOSIS — Z7902 Long term (current) use of antithrombotics/antiplatelets: Secondary | ICD-10-CM | POA: Diagnosis not present

## 2018-04-28 DIAGNOSIS — Z96612 Presence of left artificial shoulder joint: Secondary | ICD-10-CM | POA: Diagnosis not present

## 2018-04-28 DIAGNOSIS — G4700 Insomnia, unspecified: Secondary | ICD-10-CM | POA: Diagnosis not present

## 2018-04-28 DIAGNOSIS — M549 Dorsalgia, unspecified: Secondary | ICD-10-CM | POA: Diagnosis present

## 2018-04-28 DIAGNOSIS — E785 Hyperlipidemia, unspecified: Secondary | ICD-10-CM | POA: Diagnosis not present

## 2018-04-28 DIAGNOSIS — Z885 Allergy status to narcotic agent status: Secondary | ICD-10-CM | POA: Diagnosis not present

## 2018-04-28 DIAGNOSIS — I2510 Atherosclerotic heart disease of native coronary artery without angina pectoris: Secondary | ICD-10-CM | POA: Diagnosis not present

## 2018-04-28 DIAGNOSIS — X58XXXA Exposure to other specified factors, initial encounter: Secondary | ICD-10-CM | POA: Diagnosis not present

## 2018-04-28 DIAGNOSIS — R269 Unspecified abnormalities of gait and mobility: Secondary | ICD-10-CM | POA: Diagnosis not present

## 2018-04-28 DIAGNOSIS — I63511 Cerebral infarction due to unspecified occlusion or stenosis of right middle cerebral artery: Secondary | ICD-10-CM | POA: Diagnosis not present

## 2018-04-28 DIAGNOSIS — F419 Anxiety disorder, unspecified: Secondary | ICD-10-CM | POA: Diagnosis present

## 2018-04-28 DIAGNOSIS — Z96652 Presence of left artificial knee joint: Secondary | ICD-10-CM | POA: Diagnosis not present

## 2018-04-28 DIAGNOSIS — I639 Cerebral infarction, unspecified: Secondary | ICD-10-CM | POA: Diagnosis not present

## 2018-04-28 DIAGNOSIS — I6389 Other cerebral infarction: Secondary | ICD-10-CM | POA: Diagnosis not present

## 2018-04-28 DIAGNOSIS — G894 Chronic pain syndrome: Secondary | ICD-10-CM | POA: Diagnosis not present

## 2018-04-28 DIAGNOSIS — I69354 Hemiplegia and hemiparesis following cerebral infarction affecting left non-dominant side: Principal | ICD-10-CM

## 2018-04-28 DIAGNOSIS — T783XXA Angioneurotic edema, initial encounter: Secondary | ICD-10-CM | POA: Diagnosis not present

## 2018-04-28 DIAGNOSIS — I472 Ventricular tachycardia: Secondary | ICD-10-CM | POA: Diagnosis not present

## 2018-04-28 DIAGNOSIS — F17290 Nicotine dependence, other tobacco product, uncomplicated: Secondary | ICD-10-CM | POA: Diagnosis present

## 2018-04-28 DIAGNOSIS — Z9852 Vasectomy status: Secondary | ICD-10-CM | POA: Diagnosis not present

## 2018-04-28 DIAGNOSIS — N400 Enlarged prostate without lower urinary tract symptoms: Secondary | ICD-10-CM | POA: Diagnosis not present

## 2018-04-28 DIAGNOSIS — I208 Other forms of angina pectoris: Secondary | ICD-10-CM | POA: Diagnosis not present

## 2018-04-28 DIAGNOSIS — G8114 Spastic hemiplegia affecting left nondominant side: Secondary | ICD-10-CM | POA: Diagnosis not present

## 2018-04-28 DIAGNOSIS — R748 Abnormal levels of other serum enzymes: Secondary | ICD-10-CM | POA: Diagnosis not present

## 2018-04-28 DIAGNOSIS — Z888 Allergy status to other drugs, medicaments and biological substances status: Secondary | ICD-10-CM | POA: Diagnosis not present

## 2018-04-28 DIAGNOSIS — Z9889 Other specified postprocedural states: Secondary | ICD-10-CM | POA: Diagnosis not present

## 2018-04-28 MED ORDER — RAMELTEON 8 MG PO TABS
8.0000 mg | ORAL_TABLET | Freq: Every day | ORAL | Status: DC
  Administered 2018-04-28 – 2018-04-29 (×2): 8 mg via ORAL
  Filled 2018-04-28 (×2): qty 1

## 2018-04-28 MED ORDER — ATORVASTATIN CALCIUM 40 MG PO TABS: 40.0000 mg | ORAL_TABLET | Freq: Every day | ORAL | 3 refills | Status: DC

## 2018-04-28 MED ORDER — HEPARIN SODIUM (PORCINE) 5000 UNIT/ML IJ SOLN: 5000.0000 [IU] | Freq: Three times a day (TID) | INTRAMUSCULAR | Status: DC

## 2018-04-28 MED ORDER — GABAPENTIN 600 MG PO TABS
300.0000 mg | ORAL_TABLET | Freq: Three times a day (TID) | ORAL | Status: DC
  Administered 2018-04-28 – 2018-05-03 (×15): 300 mg via ORAL
  Filled 2018-04-28 (×15): qty 1

## 2018-04-28 MED ORDER — ATORVASTATIN CALCIUM 40 MG PO TABS
40.0000 mg | ORAL_TABLET | Freq: Every day | ORAL | Status: DC
  Administered 2018-04-29 – 2018-05-02 (×4): 40 mg via ORAL
  Filled 2018-04-28 (×4): qty 1

## 2018-04-28 MED ORDER — ACETAMINOPHEN 325 MG PO TABS
325.0000 mg | ORAL_TABLET | ORAL | Status: DC | PRN
  Administered 2018-04-28 – 2018-05-03 (×7): 650 mg via ORAL
  Filled 2018-04-28 (×7): qty 2

## 2018-04-28 MED ORDER — SORBITOL 70 % SOLN
30.0000 mL | Freq: Every day | Status: DC | PRN
  Administered 2018-04-28 – 2018-05-02 (×4): 30 mL via ORAL
  Filled 2018-04-28 (×6): qty 30

## 2018-04-28 MED ORDER — CLOPIDOGREL BISULFATE 75 MG PO TABS
75.0000 mg | ORAL_TABLET | Freq: Every day | ORAL | Status: DC
  Administered 2018-04-29 – 2018-05-03 (×5): 75 mg via ORAL
  Filled 2018-04-28 (×5): qty 1

## 2018-04-28 MED ORDER — ONDANSETRON HCL 4 MG PO TABS: 4.0000 mg | ORAL_TABLET | Freq: Four times a day (QID) | ORAL | Status: DC | PRN

## 2018-04-28 MED ORDER — ASPIRIN 325 MG PO TABS
325.0000 mg | ORAL_TABLET | Freq: Every day | ORAL | Status: DC
  Administered 2018-04-29 – 2018-05-03 (×5): 325 mg via ORAL
  Filled 2018-04-28 (×5): qty 1

## 2018-04-28 MED ORDER — HEPARIN SODIUM (PORCINE) 5000 UNIT/ML IJ SOLN
5000.0000 [IU] | Freq: Three times a day (TID) | INTRAMUSCULAR | Status: DC
  Administered 2018-04-28 – 2018-04-30 (×6): 5000 [IU] via SUBCUTANEOUS
  Filled 2018-04-28 (×6): qty 1

## 2018-04-28 MED ORDER — CLOPIDOGREL BISULFATE 75 MG PO TABS: 75.0000 mg | ORAL_TABLET | Freq: Every day | ORAL | 3 refills | Status: DC

## 2018-04-28 MED ORDER — PANTOPRAZOLE SODIUM 40 MG PO TBEC
40.0000 mg | DELAYED_RELEASE_TABLET | Freq: Every day | ORAL | Status: DC
  Administered 2018-04-29 – 2018-05-03 (×5): 40 mg via ORAL
  Filled 2018-04-28 (×5): qty 1

## 2018-04-28 MED ORDER — ONDANSETRON HCL 4 MG/2ML IJ SOLN: 4.0000 mg | Freq: Four times a day (QID) | INTRAMUSCULAR | Status: DC | PRN

## 2018-04-28 MED ORDER — ASPIRIN 325 MG PO TABS: 325.0000 mg | ORAL_TABLET | Freq: Every day | ORAL | 3 refills | Status: AC

## 2018-04-28 MED ORDER — OXYCODONE HCL 5 MG PO TABS
20.0000 mg | ORAL_TABLET | ORAL | Status: DC | PRN
  Administered 2018-04-28 – 2018-05-03 (×28): 20 mg via ORAL
  Filled 2018-04-28 (×29): qty 4

## 2018-04-28 NOTE — Progress Notes (Signed)
Subjective: Patient was doing well, no acute events overnight. He reports that he has been feeling stronger today, he had been walking around today with some assistance. He still has been having shoulder pain but no long is having any jerking movements. We discussed the possibility of CIR with him and he is in agreement with the plan.   Objective:  Vital signs in last 24 hours: Vitals:   04/27/18 1554 04/27/18 1937 04/28/18 0012 04/28/18 0324  BP: 113/73 (!) 137/93 (!) 146/74 (!) 137/92  Pulse: 75 78 64   Resp: 18 18 18 18   Temp: 98.3 F (36.8 C) 98.8 F (37.1 C) (!) 97.1 F (36.2 C) 98.4 F (36.9 C)  TempSrc: Oral Oral Oral Oral  SpO2:  99% 99% 97%  Weight:      Height:        General: Well nourished, well appearing, NAD  HEENT: Normocephalic, nontraumatic, midline trachea Cardiac: RRR, normal S1, S2, no murmurs, rubs or gallops  Pulmonary: Lungs CTA bilaterally, no wheezing, rhonchi or rales  Abdomen: Soft, non-tender, +bowel sounds, no guarding or masses noted  Extremity: No LE edema, no muscle atrophy, no lesions or wounds noted  Neuro: Alert and oriented x3, PERRLA, moves all extremities, 4+/5 left hand grip, minimal left hand dysmetria,  Psychiatry: Normal mood and affect    Assessment/Plan: This is a 72 year old male with a PMH of HTN, chronic back pain, idiopathic peripheral neuropathy (with nerve stimulator) who presented with left arm and leg weakness, also had an episode of chest pain in the ER with a 5-6 beat run of NSVT.  Right posterior and occipital CVA: Presented with left arm and leg weakness. Found to have a right occipital and posterior infarcts on imaging. MRI was unable to be done due to TENS unit. CTA head and neck showed right mid CCA large soft plaque with surface ulceration. TEE showed normal EF 55-60%, with no source of an embolus. Loop recorder placed and patient will follow up with cardiology outpatient. PT recommended CIR evaluation.   -Continue  ASA 325 on discharge -Continue Clopidogrel 75mg  on discharge -CIR   Rhythmic jerking motion of left arm: Patients left arm had rhythmic jerking around when he was admitted, it had started that day. It was possible that this was due to a focal status epilepticus and neurology started him on keppra and ordered and EEG. EEG showed no signs of epileptiform activity and his symptoms resolved overnight. He did get his remote for the TENS unit and made some adjustments to it. Neurology discontinued the AEDS. This was possibly due to his TENs unit but it's unclear. He has been on seizure precautions and has had no recurrence of the rhythmic jerking movement.   NSTEMI: Patient had one episode of chest pain while in the ED, was sharp in nature. Had an episode fo 5-6 beat NSVT around that time. Was started on amiodarone drip by ED. Troponin was 0.22, EKG showed some elevated ST elevations in inferior leads, isoelectric to TP segment. Amiodarone drip was stopped. Troponin's increased to 2.21 and 3.09, cardiology was notified and since EKG did not show deep T wave inversion or ST elevation with tombstone they did not recommend intervention.Cardiology will do an outpatient lexiscan myoview after rehab is completed.  -Has loop recorder in place and has wound check on 8/26 -ASA 325 -Lipitor 40 mg daily -CK was 166 -Follow up with cardiology outpatient  AKI: On admission BUN 30, Cr 1.37, elevated from base  line. 04/26/18 BUN 19 and Cr 1.02.   BOZ:[id number] on exam. On amlodipine at home.  -Hold home medications  Chronic back pain Idiopathic peripheral neuropathy:Has a nerve stimulator in the back. He states that he has it always on. He adjusted the settings.  -Oxycodone 20mg  q4 hours for pain -Continue gabapentin  GERD: Pantoprazole  FEN:No fluids, replete lytes prn,regular VTE EBB:WNJN Code Status: FULL  Dispo: Anticipated discharge in approximately today pending inpatient rehab  acceptance   Asencion Noble, MD 04/28/2018, 6:23 AM Pager: [phone]

## 2018-04-28 NOTE — Progress Notes (Signed)
Physical Therapy Treatment Patient Details Name: William Jordan MRN: [id number] DOB: 1945-12-13 Today's Date: 04/28/2018    History of Present Illness This is a 72 year old male with a PMH of HTN, chronic back pain (under pain management, opoid abuse), idiopathic peripheral neuropathy (with nerve stimulator),and recent diagnosis of CHF; who presented with blurred vision, left arm and leg weakness, also had an episode of chest pain in the ER. CT found Increased conspicuity of multiple small cortical infarctions in the right posterior occipital and parietal lobes.    PT Comments    Patient is making progress toward PT goals and tolerated increased gait distance with +2 assistance for balance. Continue to recommend CIR level therapies.    Follow Up Recommendations  CIR;Supervision/Assistance - 24 hour     Equipment Recommendations  (TBD)    Recommendations for Other Services Rehab consult     Precautions / Restrictions Precautions Precautions: Fall    Mobility  Bed Mobility Overal bed mobility: Needs Assistance Bed Mobility: Supine to Sit     Supine to sit: Min assist;HOB elevated     General bed mobility comments: assist to steady; increased time and use of rail   Transfers Overall transfer level: Needs assistance Equipment used: 2 person hand held assist Transfers: Sit to/from Stand Sit to Stand: +2 physical assistance;+2 safety/equipment;Min assist         General transfer comment: assistance for balance and cues for safety  Ambulation/Gait Ambulation/Gait assistance: Mod assist;+2 physical assistance;+2 safety/equipment Gait Distance (Feet): 100 Feet Assistive device: 2 person hand held assist Gait Pattern/deviations: Decreased step length - left;Decreased step length - right;Step-through pattern;Narrow base of support Gait velocity: decreased   General Gait Details: pt requires assistance for balance especially with horizontal head turns; L side stronger today;  near scissoring when distracted by environment   Stairs             Wheelchair Mobility    Modified Rankin (Stroke Patients Only) Modified Rankin (Stroke Patients Only) Pre-Morbid Rankin Score: No symptoms Modified Rankin: Moderately severe disability     Balance Overall balance assessment: Needs assistance Sitting-balance support: No upper extremity supported;Feet supported Sitting balance-Leahy Scale: Fair     Standing balance support: Bilateral upper extremity supported Standing balance-Leahy Scale: Poor                              Cognition Arousal/Alertness: Awake/alert Behavior During Therapy: WFL for tasks assessed/performed;Impulsive Overall Cognitive Status: No family/caregiver present to determine baseline cognitive functioning Area of Impairment: Attention;Following commands;Safety/judgement;Awareness;Problem solving;Memory                   Current Attention Level: Sustained Memory: Decreased short-term memory Following Commands: Follows one step commands with increased time;Follows one step commands consistently Safety/Judgement: Decreased awareness of safety;Decreased awareness of deficits Awareness: Emergent Problem Solving: Difficulty sequencing;Requires verbal cues        Exercises      General Comments        Pertinent Vitals/Pain Pain Assessment: Faces Faces Pain Scale: Hurts a little bit Pain Location: Bilateral Feet, L shoulder Pain Descriptors / Indicators: Constant;Discomfort Pain Intervention(s): Limited activity within patient's tolerance;Monitored during session;Repositioned    Home Living                      Prior Function            PT Goals (current goals can now be  found in the care plan section) Acute Rehab PT Goals PT Goal Formulation: With patient Time For Goal Achievement: 05/10/18 Potential to Achieve Goals: Good Progress towards PT goals: Progressing toward goals     Frequency    Min 4X/week      PT Plan Current plan remains appropriate    Co-evaluation PT/OT/SLP Co-Evaluation/Treatment: Yes Reason for Co-Treatment: Necessary to address cognition/behavior during functional activity;For patient/therapist safety;To address functional/ADL transfers PT goals addressed during session: Mobility/safety with mobility;Balance;Strengthening/ROM        AM-PAC PT "6 Clicks" Daily Activity  Outcome Measure  Difficulty turning over in bed (including adjusting bedclothes, sheets and blankets)?: A Little Difficulty moving from lying on back to sitting on the side of the bed? : A Little Difficulty sitting down on and standing up from a chair with arms (e.g., wheelchair, bedside commode, etc,.)?: A Lot Help needed moving to and from a bed to chair (including a wheelchair)?: A Little Help needed walking in hospital room?: A Lot Help needed climbing 3-5 steps with a railing? : A Lot 6 Click Score: 15    End of Session Equipment Utilized During Treatment: Gait belt Activity Tolerance: Patient tolerated treatment well Patient left: in chair;with call bell/phone within reach;with chair alarm set Nurse Communication: Mobility status PT Visit Diagnosis: Difficulty in walking, not elsewhere classified (R26.2);Other symptoms and signs involving the nervous system (R29.898);Ataxic gait (R26.0)     Time: 5790-3833 PT Time Calculation (min) (ACUTE ONLY): 30 min  Charges:  $Gait Training: 8-22 mins                     Earney Navy, PTA Pager: [phone]     Darliss Cheney 04/28/2018, 11:31 AM

## 2018-04-28 NOTE — Progress Notes (Signed)
Patient admitted to [place] rehab with all his personal belongings. Oriented to unit and room. Call light in reach.  Nicholes Rough, LPN

## 2018-04-28 NOTE — Progress Notes (Signed)
Occupational Therapy Treatment Patient Details Name: William Jordan MRN: [id number] DOB: Oct 03, 1945 Today's Date: 04/28/2018    History of present illness This is a 72 year old male with a PMH of HTN, chronic back pain (under pain management, opoid abuse), idiopathic peripheral neuropathy (with nerve stimulator),and recent diagnosis of CHF; who presented with blurred vision, left arm and leg weakness, also had an episode of chest pain in the ER. CT found Increased conspicuity of multiple small cortical infarctions in the right posterior occipital and parietal lobes.   OT comments  Pt progressing towards OT goals at this time. LUE is improving in strength, ROM and overall function. Improved participation in [place] grooming tasks at sink this session. Pt continues to have cognitive deficits and very little safety awareness as well as decreased short term memory. Pt LOVED participating in therapy and is eager to progress to CIR level therapies to maximize independence.   Follow Up Recommendations  CIR;Supervision/Assistance - 24 hour    Equipment Recommendations  Other (comment)(defer to next venue)    Recommendations for Other Services      Precautions / Restrictions Precautions Precautions: Fall Restrictions Weight Bearing Restrictions: No       Mobility Bed Mobility Overal bed mobility: Needs Assistance Bed Mobility: Supine to Sit     Supine to sit: Min assist;HOB elevated     General bed mobility comments: assist to steady; increased time and use of rail   Transfers Overall transfer level: Needs assistance Equipment used: 2 person hand held assist Transfers: Sit to/from Stand Sit to Stand: +2 physical assistance;+2 safety/equipment;Min assist         General transfer comment: assistance for balance and cues for safety    Balance Overall balance assessment: Needs assistance Sitting-balance support: No upper extremity supported;Feet supported Sitting balance-Leahy Scale:  Fair     Standing balance support: Bilateral upper extremity supported Standing balance-Leahy Scale: Poor                             ADL either performed or assessed with clinical judgement   ADL Overall ADL's : Needs assistance/impaired     Grooming: Minimal assistance;Standing;Wash/dry face Grooming Details (indicate cue type and reason): min A for balance                 Toilet Transfer: Moderate assistance;+2 for physical assistance;+2 for safety/equipment;Ambulation Toilet Transfer Details (indicate cue type and reason): 2 person HHA for short ambulation Toileting- Clothing Manipulation and Hygiene: Moderate assistance;Sit to/from stand Toileting - Clothing Manipulation Details (indicate cue type and reason): assist for balance and boost             Vision       Perception     Praxis      Cognition Arousal/Alertness: Awake/alert Behavior During Therapy: [facility] for tasks assessed/performed;Impulsive Overall Cognitive Status: No family/caregiver present to determine baseline cognitive functioning Area of Impairment: Attention;Following commands;Safety/judgement;Awareness;Problem solving;Memory                   Current Attention Level: Sustained Memory: Decreased short-term memory Following Commands: Follows one step commands with increased time;Follows one step commands consistently Safety/Judgement: Decreased awareness of safety;Decreased awareness of deficits Awareness: Emergent Problem Solving: Difficulty sequencing;Requires verbal cues          Exercises     Shoulder Instructions       General Comments      Pertinent Vitals/ Pain  Pain Assessment: Faces Faces Pain Scale: Hurts a little bit Pain Location: Bilateral Feet, L shoulder Pain Descriptors / Indicators: Constant;Discomfort Pain Intervention(s): Limited activity within patient's tolerance;Monitored during session;Repositioned  Home Living                                       Lives With: Spouse    Prior Functioning/Environment              Frequency  Min 3X/week        Progress Toward Goals  OT Goals(current goals can now be found in the care plan section)  Progress towards OT goals: Progressing toward goals  Acute Rehab OT Goals Patient Stated Goal: "get independent" OT Goal Formulation: With patient Time For Goal Achievement: 05/10/18 Potential to Achieve Goals: Good  Plan Discharge plan remains appropriate;Frequency remains appropriate    Co-evaluation    PT/OT/SLP Co-Evaluation/Treatment: Yes Reason for Co-Treatment: Necessary to address cognition/behavior during functional activity;For patient/therapist safety;To address functional/ADL transfers PT goals addressed during session: Mobility/safety with mobility;Balance;Strengthening/ROM OT goals addressed during session: ADL's and self-care;Strengthening/ROM      AM-PAC PT "6 Clicks" Daily Activity     Outcome Measure   Help from another person eating meals?: A Little Help from another person taking care of personal grooming?: A Little Help from another person toileting, which includes using toliet, bedpan, or urinal?: A Lot Help from another person bathing (including washing, rinsing, drying)?: A Lot Help from another person to put on and taking off regular upper body clothing?: A Lot Help from another person to put on and taking off regular lower body clothing?: A Lot 6 Click Score: 14    End of Session Equipment Utilized During Treatment: Gait belt  OT Visit Diagnosis: Unsteadiness on feet (R26.81);Other abnormalities of gait and mobility (R26.89);Muscle weakness (generalized) (M62.81);Other symptoms and signs involving cognitive function;Hemiplegia and hemiparesis Hemiplegia - Right/Left: Left Hemiplegia - dominant/non-dominant: Non-Dominant Hemiplegia - caused by: Cerebral infarction   Activity Tolerance Patient tolerated treatment well    Patient Left in chair;with call bell/phone within reach;with chair alarm set   Nurse Communication Mobility status        Time: 8502-7741 OT Time Calculation (min): 30 min  Charges: OT General Charges $OT Visit: 1 Visit OT Treatments $Self Care/Home Management : 8-22 mins  Hulda Humphrey OTR/L [place] 04/28/2018, 4:15 PM

## 2018-04-28 NOTE — Progress Notes (Signed)
Meredith Staggers, MD    Meredith Staggers, MD  Physician  Physical Medicine and Rehabilitation      Consult Note  Signed     Date of Service:  04/26/2018  2:02 PM         Related encounter: ED to Hosp-Admission (Current) from 04/25/2018 in [place] [facility]             Signed          Expand All Collapse All            Expand widget buttonCollapse widget button    Show:Clear all   ManualTemplateCopied  Added by:     Angiulli, Lavon Paganini, PA-C  Meredith Staggers, MD   Hover for detailscustomization button                                                                                                                                                  untitled image              Physical Medicine and Rehabilitation Consult  Reason for Consult: Left-sided weakness  Referring Physician: Family medicine        HPI: William Jordan is a 72 y.o. right handed male with history of hypertension, idiopathic peripheral neuropathy with spinal cord stimulator 2016 per Melina Schools and chronic back pain, angioedema, diastolic congestive heart failure.  Per chart review patient lives with spouse.  Independent and driving prior to admission.  One level home with 6 steps to entry.  Presented 04/25/2018 with left-sided weakness.  While in the emergency department he developed tonic-clonic jerking of his left arm.  CT of the head showed increase conspicuity of multiple small cortical infarcts in the right posterior occipital and parietal lobes.  MRI not completed due to nerve stimulator.  EEG negative for seizure.  Echocardiogram pending as well as lower extremity Dopplers pending.  TEE pending.  CT angiogram of head and neck showed atherosclerotic changes without  significant stenosis.    Follow-up CT of the head 04/27/2018 and presently on aspirin and Plavix for CVA prophylaxis.  Subcutaneous Lovenox for DVT prophylaxis.  Therapy evaluation completed with recommendations of physical medicine rehab consult.        Review of Systems   Constitutional:        Denies fever or chills   Respiratory:        Denies cough   Cardiovascular:        No chest pain or palpitations.  Noted lower extremity swelling   Gastrointestinal:        Bouts of constipation   Genitourinary:        No dysuria hematuria.  Patient does report urgency   Musculoskeletal:  Positive back pain and myalgias   Neurological:        Focal weakness as well as tingling to the left side.   Psychiatric/Behavioral:        Positive anxiety   All other systems reviewed and are negative.          Past Medical History:    Diagnosis   Date    .   Angioedema            from Benazepril reaction    .   Anxiety            takes Celexa    .   Arthritis        .   Chronic back pain        .   Enlarged prostate        .   History of blood transfusion        .   Hypertension        .   Neuropathy                 Past Surgical History:    Procedure   Laterality   Date    .   COLONOSCOPY                one polyp removed - benign    .   ELBOW SURGERY   Right        .   HERNIA REPAIR                umbilical    .   JOINT REPLACEMENT   Left            knee, x 2    .   JOINT REPLACEMENT   Right            hip    .   JOINT REPLACEMENT   Left            partial shoulder    .   SPINAL CORD STIMULATOR INSERTION   N/A   09/28/2014        Procedure: LUMBAR SPINAL CORD STIMULATOR INSERTION;  Surgeon: Melina Schools, MD;  Location: [facility];  Service: Orthopedics;  Laterality: N/A;    .   TONSILLECTOMY             .   VASECTOMY                     Family History    Problem   Relation   Age of Onset    .   Heart disease   Father           Social History:  reports that he has been smoking e-cigarettes. He has quit using smokeless tobacco.  His smokeless tobacco use included snuff. He reports that he drinks alcohol. He reports that he does not use drugs.  Allergies:         Allergies    Allergen   Reactions    .   Ace Inhibitors   Other (See Comments)            Angioedema (throat swelling)    .   Morphine   Other (See Comments)            Causes confusion and "makes me crazy," per the patient    .   Sulfa Antibiotics   Other (See Comments)  Unknown reaction              Medications Prior to Admission    Medication   Sig   Dispense   Refill    .   alfuzosin (UROXATRAL) 10 MG 24 hr tablet   Take 10 mg by mouth daily after supper.            Marland Kitchen   amLODipine (NORVASC) 10 MG tablet   Take 1 tablet (10 mg total) by mouth daily.   30 tablet   11    .   celecoxib (CELEBREX) 100 MG capsule   Take 100 mg by mouth 2 (two) times daily.             .   cetirizine (ZYRTEC) 10 MG tablet   Take 10 mg by mouth 2 (two) times daily.            .   cholecalciferol (VITAMIN D) 1000 units tablet   Take 2,000 Units by mouth daily.            .   citalopram (CELEXA) 20 MG tablet   Take 20 mg by mouth daily.            .   cloNIDine (CATAPRES) 0.2 MG tablet   Take 0.2 mg by mouth 2 (two) times daily.            .   colchicine 0.6 MG tablet   Take 0.6 mg by mouth 3 (three) times daily as needed (for gout flares).             .   docusate sodium (COLACE) 100 MG capsule   Take 2 capsules (200 mg total) by mouth at bedtime. (Patient taking differently: Take 100 mg by mouth at bedtime. )   30 capsule   0    .   gabapentin (NEURONTIN) 600  MG tablet   Take 300 mg by mouth 3 (three) times daily.             Marland Kitchen   omeprazole (PRILOSEC) 20 MG capsule   Take 20 mg by mouth daily.             .   Oxycodone HCl 20 MG TABS   Take 1 tablet (20 mg total) by mouth every 8 (eight) hours as needed. (Patient taking differently: Take 20 mg by mouth every 4 (four) hours as needed (pain). )   1 tablet   0    .   Saw Palmetto, Serenoa repens, (SAW PALMETTO PO)   Take 1 capsule by mouth daily.            Marland Kitchen   morphine (MS CONTIN) 15 MG 12 hr tablet   Take 1 tablet (15 mg total) by mouth every 12 (twelve) hours. (Patient not taking: Reported on 12/10/2017)   60 tablet   0    .   polyethylene glycol (MIRALAX / GLYCOLAX) packet   Take 17 g by mouth daily. (Patient not taking: Reported on 04/25/2018)   14 each   0    .   vitamin B-12 (CYANOCOBALAMIN) 100 MCG tablet   Take 1 tablet (100 mcg total) by mouth daily. (Patient not taking: Reported on 04/25/2018)   30 tablet   1          Home:  [place] expects to be discharged to:: Private residence  Living Arrangements: Spouse/significant other  Available Help at Discharge: Family, Available 24 hours/day  Type of Home: House  Home Access: Stairs to enter  [employer] of Steps: 6  Entrance Stairs-Rails: Right, Can reach both, Left  Home Layout: One level  Bathroom Shower/Tub: Tourist information centre manager: Handicapped height  Bathroom Accessibility: Yes  Home Equipment: Environmental consultant - 2 wheels, Hand held shower head, Grab bars - tub/shower, Shower seat - built in, [place] - single point, Environmental consultant - 4 wheels   Functional History:  Prior Function  Level of Independence: Independent  Comments: drives, cooks  Functional Status:   Mobility:  Bed Mobility  Overal bed mobility: Needs Assistance  Bed Mobility: Supine to Sit  Supine to sit: Min assist, HOB elevated  General bed mobility comments:  increased time and effort to perform, patient with impulsivity, required cues to direct to task  Transfers  Overall transfer level: Needs assistance  Equipment used: 2 person hand held assist  Transfers: Sit to/from Stand  Sit to Stand: Mod assist, +2 physical assistance, +2 safety/equipment  General transfer comment: min A for boost, mod A +2 once in standing with L weak side buckling.   Ambulation/Gait  Ambulation/Gait assistance: Max assist, +2 safety/equipment  Gait Distance (Feet): 16 Feet  Assistive device: 2 person hand held assist  Gait Pattern/deviations: Step-to pattern, Decreased step length - left, Decreased step length - right, Staggering left  General Gait Details: Patient with inability to control LLE weight shift and stride. Postural facilitation via wrap around support to elicit upright posture and manual weight shift. Multi modal cues to initiate step. Increased time and effort.  Gait velocity: decreased  Gait velocity interpretation: <1.31 ft/sec, indicative of household ambulator       ADL:  ADL  Overall ADL's : Needs assistance/impaired  Eating/Feeding: Set up, Sitting  Eating/Feeding Details (indicate cue type and reason): required assist for opening containers  Grooming: Minimal assistance, Sitting, Wash/dry hands, Wash/dry face  Grooming Details (indicate cue type and reason): min A for tasks that require BUE assist  Upper Body Bathing: Minimal assistance  Lower Body Bathing: Moderate assistance  Upper Body Dressing : Minimal assistance, Sitting  Lower Body Dressing: Moderate assistance, Sit to/from stand  Lower Body Dressing Details (indicate cue type and reason): required assist for donning socks, able to pull up after OT initiated  Toilet Transfer: Moderate assistance, +2 for physical assistance, +2 for safety/equipment, Ambulation  Toilet Transfer Details (indicate cue type and reason): 2 person HHA for short  ambulation  Toileting- Clothing Manipulation and Hygiene: Moderate assistance, Sit to/from stand  Functional mobility during ADLs: Moderate assistance, +2 for physical assistance, +2 for safety/equipment(2 person HHA)  General ADL Comments: LUE decrease in function impacting performance as well as      Cognition:  Cognition  Overall Cognitive Status: No family/caregiver present to determine baseline cognitive functioning  Orientation Level: Oriented to person, Oriented to place, Disoriented to time, Disoriented X4  Cognition  Arousal/Alertness: Awake/alert  Behavior During Therapy: WFL for tasks assessed/performed, Impulsive  Overall Cognitive Status: No family/caregiver present to determine baseline cognitive functioning  Area of Impairment: Attention, Following commands, Safety/judgement, Awareness, Problem solving  Current Attention Level: Sustained  Following Commands: Follows one step commands with increased time  Safety/Judgement: Decreased awareness of safety, Decreased awareness of deficits  Awareness: Emergent  Problem Solving: Difficulty sequencing, Requires verbal cues, Requires tactile cues  General Comments: At one point Pt meowed during session "I just like cats"     Blood pressure 116/71, pulse 71, temperature 98 F (36.7 C), temperature source Oral,  resp. rate 18, height 5\' 10"  (1.778 m), weight 83.9 kg, SpO2 99 %.  Physical Exam   Vitals reviewed.  Constitutional: No distress.  72 year old right-handed male sitting up in chair   HENT:   Head: Normocephalic.   Eyes: Pupils are equal, round, and reactive to light.   Neck: Normal range of motion.  Neck is supple nontender   Cardiovascular: Normal rate.  Cardiac rate controlled   Respiratory: Effort normal.  Lungs clear to auscultation without wheeze   GI: Soft.  Abdomen is soft and nontender  Musculoskeletal:  Limited ROM left shoulder due to pain  Neurological:  Patient is alert.   Mood is flat but appropriate.  He provides his name and age as well as date of birth.  Follows simple commands,. LUE 3/5 to 4/5 prox to distal, some limitations due to shoulder pain. LLE 4/5 prox to distal. RUE and RLE 5/5. Decreased sensory loss stocking glove Left more affected than right. Speech dysarthric.    Skin:  Surgical scar right knee, left shoulder         Lab Results Last 24 Hours  Imaging Results (Last 48 hours)                                                                 Assessment/Plan:  Diagnosis: right parietal-occipital lobe infarcts  1.Does the need for close, 24 hr/day medical supervision in concert with the patient's rehab needs make it unreasonable for this patient to be served in a less intensive setting? Yes   2.Co-Morbidities requiring supervision/potential complications: HTN, Idiopathic peripheral neuropathy, diastolic congestive heart failure   3.Due to bladder management, bowel management, safety, skin/wound care, disease management, medication administration, pain management and patient education, does the patient require 24 hr/day rehab nursing? Yes   4.Does the patient require coordinated care of a physician, rehab nurse, PT (1-2 hrs/day, 5 days/week), OT (1-2 hrs/day, 5 days/week) and SLP (1-2 hrs/day, 5 days/week) to address physical and functional deficits in the context of the above medical diagnosis(es)? Yes Addressing deficits in the following areas: balance, endurance, locomotion, strength, transferring, bowel/bladder control, bathing, dressing, feeding, grooming, toileting, cognition, speech and psychosocial support   5.Can the patient actively participate in an intensive therapy program of at least 3 hrs of therapy per day at least 5 days per week? Yes   6.The potential for patient to make measurable gains while on inpatient rehab is good   7.Anticipated functional outcomes upon discharge from inpatient rehab are modified independent and supervision  with PT, modified independent and supervision with OT, modified independent and supervision with SLP.   8.Estimated rehab length of stay to reach the above functional goals is: 12-18 days   9.Anticipated D/C setting: Home   10.Anticipated post D/C treatments: HH therapy and Outpatient  therapy   11.Overall Rehab/Functional Prognosis: excellent      RECOMMENDATIONS:  This patient's condition is appropriate for continued rehabilitative care in the following setting: CIR  Patient has agreed to participate in recommended program. Yes  Note that insurance prior authorization may be required for reimbursement for recommended care.     Comment: Rehab Admissions Coordinator to follow up.     Thanks,     Meredith Staggers, MD, [facility]     I have personally performed a face to face diagnostic evaluation of this patient. Additionally, I have reviewed and concur with the physician assistant's documentation above.       Lavon Paganini Angiulli, PA-C  04/26/2018                Revision History                                        Routing History

## 2018-04-28 NOTE — Discharge Instructions (Signed)
[name],   It has been a pleasure working with you and we are glad you're feeling better. You were hospitalized for a stroke and an acute coronary syndome.   For your stroke,  START taking aspirin 325 mg daily for 3 months START taking Plavix 75 mg daily  START taking Lipitor 40 mg daily Complete rehab inpatient Follow up with neurology  For your acute coronary syndrome,  Take the medications listed above Follow up with Cardiology  Your blood pressures were normal when you were in the hospital STOP taking your amlodipine and clonidine Follow up with your primary care provider in 1-2 weeks to assess if you need to restart those medications.  If your symptoms worsen or you develop new symptoms, please seek medical help whether it is your primary care provider or emergency department.  If you have any questions about this hospitalization please call [phone].    Supplemental Discharge Instructions for  Implantable Loop Recorder Patients  Activity No device related restrictions to activity. DO wear your seatbelt, even if it crosses over the device site.  WOUND CARE - Keep the wound area clean and dry.  Remove the dressing the day after you return home (usually 48 hours after the procedure). - DO NOT SUBMERGE UNDER WATER UNTIL FULLY HEALED (no tub baths, hot tubs, swimming pools, etc.).  - You  may shower or take a sponge bath after the dressing is removed. DO NOT SOAK the area and do not allow the shower to directly spray on the site. - If you have staples, these will be removed in the office in 7-14 days. - If you have tape/steri-strips on your wound, these will fall off; do not pull them off prematurely.   - No bandage is needed on the site.  DO  NOT apply any creams, oils, or ointments to the wound area. - If you notice any drainage or discharge from the wound, any swelling, excessive redness or bruising at the site, or if you develop a fever > 101? F after you are  discharged home, call the office at once.  Special Instructions - You are still able to use cellular telephones.  Avoid carrying your cellular phone near your device. - When traveling through airports, show security personnel your identification card to avoid being screened in the metal detectors.  - Call the office for questions about MRI and other devices. - Avoid electrical appliances that are in poor condition or are not properly grounded. - Microwave ovens are safe to be near or to operate.

## 2018-04-28 NOTE — Progress Notes (Signed)
Medicine attending: I examined this patient today together with resident physician Dr. Mable Fill cranky and I concur with her evaluation and management plan. Handgrip stronger and coordination of left hand now near normal.  Continued progress. Anticipate discharge and readmission to inpatient rehab pending insurance approval.

## 2018-04-28 NOTE — Care Management Important Message (Signed)
Important Message  Patient Details  Name: William Jordan MRN: [id number] Date of Birth: April 29, 1946   Medicare Important Message Given:  Yes    [name] Montine Circle 04/28/2018, 1:37 PM

## 2018-04-28 NOTE — PMR Pre-admission (Signed)
PMR Admission Coordinator Pre-Admission Assessment  Patient: William Jordan is an 72 y.o., male MRN: [id number] DOB: 06-Mar-1946 Height: 5\' 10"  (177.8 cm) Weight: 83.9 kg              Insurance Information HMO:     PPO: yes     PCP:      IPA:      80/20:      OTHER: medicare advantage plan PRIMARY: [employer]      Policy#: [id number]      Subscriber: pt CM Name: Lenoria Farrier      Phone#: [phone]     Fax#: Epic access Pre-Cert#: [id number]  Approved for 7 days    Employer: retired Benefits:  Phone #: [phone]     Name: 04/28/18 Eff. Date: 09/15/2017     Deduct: none      Out of Pocket Max: $3400      Life Max: none CIR: $295 co pay per day days 1 until 6      SNF: $20 co pay per day days 1 until 20; $160 co pay per day days 21- 100 Outpatient: $15 co pay per visit     Co-Pay: visits per medical neccesity Home Health: 100%      Co-Pay: visits per medical neccesity DME: 80%     Co-Pay: 20% Providers: in network  SECONDARY: none      Medicaid Application Date:       Case Manager:  Disability Application Date:       Case Worker:   Emergency Facilities manager Information    Name Relation Home Work Mobile   Bettles Spouse [phone]  [phone]     Current Medical History  Patient Admitting Diagnosis: right parietal occipital infarcts  History of Present Illness:    : HPI: William Jordan is a 72 year old right-handed male with history of hypertension, idiopathic peripheral neuropathy with spinal cord stimulator 2016 per Dr. Melina Schools and chronic back pain, angioedema and diastolic congestive heart failure.  Patient with recent discharge from [facility] for angioedema from ACE I. presented 04/25/2018 with left-sided weakness.  While in the emergency department he developed tonic-clonic jerking of his left arm.  CT the head showed increase conspicuity of multiple small cortical infarctions in the right posterior occipital and parietal lobes.  CT angiogram  of head and neck showed atherosclerotic changes without significant stenosis.  MRI not completed due to nerve stimulator.  EEG negative for seizure.  Echocardiogram with ejection fraction of 51% and systolic function was normal.  Venous Dopplers lower extremities negative for DVT.  TEE completed 04/27/2018 with ejection fraction 60%.  No thrombus or PFO.  Negative bubble study.  Underwent loop recorder placement.  Latest follow-up cranial CT scan 04/27/2018 showing unchanged appearance of multiple areas of acute disc subacute ischemia in the right parietal and occipital lobes.  No acute hemorrhage.  Currently maintained on aspirin and Plavix for CVA prophylaxis x3 months then Plavix alone.  Subcutaneous heparin for DVT prophylaxis.  Patient with intermittent bouts of delirium and confusion that have improved.  Maintain on a regular diet.  Complete NIHSS TOTAL: 1    Past Medical History  Past Medical History:  Diagnosis Date  . Angioedema    from Benazepril reaction  . Anxiety    takes Celexa  . Arthritis   . Chronic back pain   . Enlarged prostate   . History of blood transfusion   . Hypertension   . Neuropathy  Family History  family history includes Heart disease in his father.  Prior Rehab/Hospitalizations:  Has the patient had major surgery during 100 days prior to admission? No  Current Medications   Current Facility-Administered Medications:  .  0.9 %  sodium chloride infusion, , Intravenous, PRN, Evans Lance, MD, Last Rate: 10 mL/hr at 04/26/18 1123 .  acetaminophen (TYLENOL) tablet 325-650 mg, 325-650 mg, Oral, Q4H PRN, Evans Lance, MD, 650 mg at 04/27/18 2005 .  aspirin tablet 325 mg, 325 mg, Oral, Daily, Evans Lance, MD, 325 mg at 04/28/18 0830 .  atorvastatin (LIPITOR) tablet 40 mg, 40 mg, Oral, q1800, Evans Lance, MD, 40 mg at 04/27/18 1659 .  clopidogrel (PLAVIX) tablet 75 mg, 75 mg, Oral, Daily, Evans Lance, MD, 75 mg at 04/28/18 0830 .  gabapentin  (NEURONTIN) tablet 300 mg, 300 mg, Oral, TID, Evans Lance, MD, 300 mg at 04/28/18 0829 .  heparin injection 5,000 Units, 5,000 Units, Subcutaneous, Q8H, Evans Lance, MD, 5,000 Units at 04/28/18 1414 .  ondansetron (ZOFRAN) injection 4 mg, 4 mg, Intravenous, Q6H PRN, Evans Lance, MD .  oxyCODONE (Oxy IR/ROXICODONE) immediate release tablet 20 mg, 20 mg, Oral, Q4H PRN, Evans Lance, MD, 20 mg at 04/28/18 1414 .  pantoprazole (PROTONIX) EC tablet 40 mg, 40 mg, Oral, Daily, Evans Lance, MD, 40 mg at 04/28/18 0830 .  ramelteon (ROZEREM) tablet 8 mg, 8 mg, Oral, QHS, Evans Lance, MD, 8 mg at 04/27/18 2203  Patients Current Diet:  Diet Order            Diet Heart Room service appropriate? 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 Has peripheral neuropathy and when he is having a bad day , he will use a RW  Development worker, international aid / [place] Devices/Equipment: Eyeglasses Home Equipment: Environmental consultant - 2 wheels, Hand held shower head, Grab bars - tub/shower, Shower seat - built in, [place] - single point, Environmental consultant - 4 wheels  Prior Device Use: Indicate devices/aids used by the patient prior to current illness, exacerbation or injury? None of the above  Prior Functional Level Prior Function Level of Independence: Independent Comments: drives, cooks  Self Care: Did the patient need help bathing, dressing, using the toilet or eating?  Independent  Indoor Mobility: Did the patient need assistance with walking from room to room (with or without device)? Independent  Stairs: Did the patient need assistance with internal or external  stairs (with or without device)? Independent  Functional Cognition: Did the patient need help planning regular tasks such as shopping or remembering to take medications? Independent  Current Functional Level Cognition  Overall Cognitive Status: No family/caregiver present to determine baseline cognitive functioning Current Attention Level: Sustained Orientation Level: Oriented X4 Following Commands: Follows one step commands with increased time, Follows one step commands consistently Safety/Judgement: Decreased awareness of safety, Decreased awareness of deficits General Comments: At one point Pt meowed during session "I just like cats"    Extremity Assessment (includes Sensation/Coordination)  Upper Extremity Assessment: LUE deficits/detail, Generalized weakness, RUE deficits/detail RUE Deficits / Details: pain  in R shoulder limiting ROM to approx 90 degrees FF RUE: Unable to fully assess due to pain LUE Deficits / Details: grasp grossly 3/5, decreased sensation, biceps 4/5, triceps 4/5, decreased coordination for fine and gross motor, shoulder FF requires increased time and concentration LUE Sensation: decreased light touch LUE Coordination: decreased gross motor, decreased fine motor  Lower Extremity Assessment: LLE deficits/detail LLE Deficits / Details: noted LLE strength assymetry compared to Right gross motions 3+ /5  LLE Sensation: decreased proprioception LLE Coordination: decreased fine motor, decreased gross motor    ADLs  Overall ADL's : Needs assistance/impaired Eating/Feeding: Set up, Sitting Eating/Feeding Details (indicate cue type and reason): required assist for opening containers Grooming: Minimal assistance, Sitting, Wash/dry hands, Wash/dry face Grooming Details (indicate cue type and reason): min A for tasks that require BUE assist Upper Body Bathing: Minimal assistance Lower Body Bathing: Moderate assistance Upper Body Dressing : Minimal assistance,  Sitting Lower Body Dressing: Moderate assistance, Sit to/from stand Lower Body Dressing Details (indicate cue type and reason): required assist for donning socks, able to pull up after OT initiated Toilet Transfer: Moderate assistance, +2 for physical assistance, +2 for safety/equipment, Ambulation Toilet Transfer Details (indicate cue type and reason): 2 person HHA for short ambulation Toileting- Clothing Manipulation and Hygiene: Moderate assistance, Sit to/from stand Functional mobility during ADLs: Moderate assistance, +2 for physical assistance, +2 for safety/equipment(2 person HHA) General ADL Comments: LUE decrease in function impacting performance as well as     Mobility  Overal bed mobility: Needs Assistance Bed Mobility: Supine to Sit Supine to sit: Min assist, HOB elevated General bed mobility comments: assist to steady; increased time and use of rail     Transfers  Overall transfer level: Needs assistance Equipment used: 2 person hand held assist Transfers: Sit to/from Stand Sit to Stand: +2 physical assistance, +2 safety/equipment, Min assist General transfer comment: assistance for balance and cues for safety    Ambulation / Gait / Stairs / Wheelchair Mobility  Ambulation/Gait Ambulation/Gait assistance: Mod assist, +2 physical assistance, +2 safety/equipment Gait Distance (Feet): 100 Feet Assistive device: 2 person hand held assist Gait Pattern/deviations: Decreased step length - left, Decreased step length - right, Step-through pattern, Narrow base of support General Gait Details: pt requires assistance for balance especially with horizontal head turns; L side stronger today; near scissoring when distracted by environment Gait velocity: decreased Gait velocity interpretation: <1.31 ft/sec, indicative of household ambulator    Posture / Balance Balance Overall balance assessment: Needs assistance Sitting-balance support: No upper extremity supported, Feet  supported Sitting balance-Leahy Scale: Fair Standing balance support: Bilateral upper extremity supported Standing balance-Leahy Scale: Poor Standing balance comment: dependent on external support from therapy team    Special needs/care consideration BiPAP/CPAP  N/a CPM n/a Continuous Drip IV n/a Dialysis  N/a Life Vest n/a Oxygen n/a Special Bed n/a Trach Size n/a Wound Vac n/a Skin intact              Bowel mgmt: continent Bladder mgmt: continent Diabetic mgmt Hgb A1c 5.8 impulsive with decreased safety awareness Seizure precautions   Previous Home Environment Living Arrangements: Spouse/significant other  Lives With: Spouse Available Help at Discharge: Family, Available 24 hours/day Type of Home: House Home Layout: One level Home Access: Stairs to enter Entrance Stairs-Rails: Right, Can reach both, Left Entrance Stairs-Number of Steps: 6 Bathroom Shower/Tub: Multimedia programmer: Handicapped height Bathroom Accessibility: Yes How Accessible: Accessible via wheelchair, Accessible via walker Home Care Services: No  Discharge Living Setting  Plans for Discharge Living Setting: Patient's home, Lives with (comment)(spouse) Type of Home at Discharge: House Discharge Home Layout: One level Discharge Home Access: Stairs to enter Entrance Stairs-Rails: Right, Left, Can reach both Entrance Stairs-Number of Steps: 6 Discharge Bathroom Shower/Tub: Walk-in shower Discharge Bathroom Toilet: Handicapped height Discharge Bathroom Accessibility: Yes How Accessible: Accessible via walker Does the patient have any problems obtaining your medications?: No  Social/Family/Support Systems Patient Roles: Spouse Contact Information: Judeen Hammans, wife Anticipated Caregiver: wife Anticipated Caregiver's Contact Information: [phone] Ability/Limitations of Caregiver: no limitations Caregiver Availability: 24/7 Discharge Plan Discussed with Primary Caregiver: Yes Is Caregiver  In Agreement with Plan?: Yes Does Caregiver/Family have Issues with Lodging/Transportation while Pt is in Rehab?: No  Goals/Additional Needs Patient/Family Goal for Rehab: Mod I to supervision to PT, OT, and SLP Expected length of stay: ELOS 12 to 18 days Special Service Needs: Decreased safety awarenss; very impulsive Pt/Family Agrees to Admission and willing to participate: Yes Program Orientation Provided & Reviewed with Pt/Caregiver Including Roles  & Responsibilities: Yes  Decrease burden of Care through IP rehab admission: n/a  Possible need for SNF placement upon discharge:not anticipated   Patient Condition: This patient's condition remains as documented in the consult dated 04/27/2018, in which the Rehabilitation Physician determined and documented that the patient's condition is appropriate for intensive rehabilitative care in an inpatient rehabilitation facility. Will admit to inpatient rehab today.  Preadmission Screen Completed By:  Cleatrice Burke, 04/28/2018 2:23 PM ______________________________________________________________________   Discussed status with Dr. Letta Pate on 04/28/2018 at  12 and received telephone approval for admission today.  Admission Coordinator:  Cleatrice Burke, time [id number] Date 04/28/2018

## 2018-04-28 NOTE — Progress Notes (Signed)
Cristina Gong, RN  Rehab Admission Coordinator  Physical Medicine and Rehabilitation  PMR Pre-admission  Signed  Date of Service:  04/28/2018 2:23 PM       Related encounter: ED to Hosp-Admission (Current) from 04/25/2018 in [facility]      Signed         Show:Clear all [x] Manual[x] Template[x] Copied  Added by: [x] Cristina Gong, RN  [] Hover for details PMR Admission Coordinator Pre-Admission Assessment  Patient: William Jordan is an 72 y.o., male MRN: [id number] DOB: 1945/09/20 Height: 5\' 10"  (177.8 cm) Weight: 83.9 kg                                                                                                                                                  Insurance Information HMO:     PPO: yes     PCP:      IPA:      80/20:      OTHER: medicare advantage plan PRIMARY: [employer]      Policy#: [id number]      Subscriber: pt CM Name: Lenoria Farrier      Phone#: [phone]     Fax#: Epic access Pre-Cert#: [id number]  Approved for 7 days    Employer: retired Benefits:  Phone #: [phone]     Name: 04/28/18 Eff. Date: 09/15/2017     Deduct: none      Out of Pocket Max: $3400      Life Max: none CIR: $295 co pay per day days 1 until 6      SNF: $20 co pay per day days 1 until 20; $160 co pay per day days 21- 100 Outpatient: $15 co pay per visit     Co-Pay: visits per medical neccesity Home Health: 100%      Co-Pay: visits per medical neccesity DME: 80%     Co-Pay: 20% Providers: in network  SECONDARY: none      Medicaid Application Date:       Case Manager:  Disability Application Date:       Case Worker:   Emergency Publishing copy Information    Name Relation Home Work Mobile   [place] Spouse [phone]  [phone]     Current Medical History  Patient Admitting Diagnosis: right parietal occipital infarcts  History of Present Illness:    : HPI:William Jordan is a 72 year old right-handed  male with history of hypertension, idiopathic peripheral neuropathy with spinal cord stimulator 2016 per Dr. Melina Schools and chronic back pain, angioedema and diastolic congestive heart failure. Patient with recent discharge from [facility] for angioedema from [facility] I.presented 04/25/2018 with left-sided weakness. While in the emergency department he developed tonic-clonic jerking of his left arm. CT the head showed increase conspicuityof multiple small cortical infarctions in  the right posterior occipital and parietal lobes. CT angiogram of head and neck showed atherosclerotic changes without significant stenosis. MRI not completed due to nerve stimulator. EEG negative for seizure. Echocardiogram with ejection fraction of 58% and systolic function was normal. Venous Dopplers lower extremities negative for DVT. TEE completed 04/27/2018 with ejection fraction 60%. No thrombus or PFO. Negative bubble study. Underwent loop recorder placement. Latest follow-up cranial CT scan 04/27/2018 showing unchanged appearance of multiple areas of acute disc subacute ischemia in the right parietal and occipital lobes. No acute hemorrhage. Currently maintained on aspirin and Plavix for CVA prophylaxis x3 months then Plavix alone. Subcutaneous heparin for DVT prophylaxis. Patient with intermittent bouts of delirium and confusion that have improved. Maintain on a regular diet.  Complete NIHSS TOTAL: 1  Past Medical History      Past Medical History:  Diagnosis Date  . Angioedema    from Benazepril reaction  . Anxiety    takes Celexa  . Arthritis   . Chronic back pain   . Enlarged prostate   . History of blood transfusion   . Hypertension   . Neuropathy     Family History  family history includes Heart disease in his father.  Prior Rehab/Hospitalizations:  Has the patient had major surgery during 100 days prior to admission? No  Current Medications   Current  Facility-Administered Medications:  .  0.9 %  sodium chloride infusion, , Intravenous, PRN, Evans Lance, MD, Last Rate: 10 mL/hr at 04/26/18 1123 .  acetaminophen (TYLENOL) tablet 325-650 mg, 325-650 mg, Oral, Q4H PRN, Evans Lance, MD, 650 mg at 04/27/18 2005 .  aspirin tablet 325 mg, 325 mg, Oral, Daily, Evans Lance, MD, 325 mg at 04/28/18 0830 .  atorvastatin (LIPITOR) tablet 40 mg, 40 mg, Oral, q1800, Evans Lance, MD, 40 mg at 04/27/18 1659 .  clopidogrel (PLAVIX) tablet 75 mg, 75 mg, Oral, Daily, Evans Lance, MD, 75 mg at 04/28/18 0830 .  gabapentin (NEURONTIN) tablet 300 mg, 300 mg, Oral, TID, Evans Lance, MD, 300 mg at 04/28/18 0829 .  heparin injection 5,000 Units, 5,000 Units, Subcutaneous, Q8H, Evans Lance, MD, 5,000 Units at 04/28/18 1414 .  ondansetron (ZOFRAN) injection 4 mg, 4 mg, Intravenous, Q6H PRN, Evans Lance, MD .  oxyCODONE (Oxy IR/ROXICODONE) immediate release tablet 20 mg, 20 mg, Oral, Q4H PRN, Evans Lance, MD, 20 mg at 04/28/18 1414 .  pantoprazole (PROTONIX) EC tablet 40 mg, 40 mg, Oral, Daily, Evans Lance, MD, 40 mg at 04/28/18 0830 .  ramelteon (ROZEREM) tablet 8 mg, 8 mg, Oral, QHS, Evans Lance, MD, 8 mg at 04/27/18 2203  Patients Current Diet:     Diet Order             Diet Heart Room service appropriate? 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 Has peripheral neuropathy and when he is having a bad day , he will use a Education administrator / Patrick Devices/Equipment: Eyeglasses Home Equipment: Walker - 2 wheels, Hand held shower head, Grab bars  - tub/shower,  Shower seat - built in, [place] - single point, Environmental consultant - 4 wheels  Prior Device Use: Indicate devices/aids used by the patient prior to current illness, exacerbation or injury? None of the above  Prior Functional Level Prior Function Level of Independence: Independent Comments: drives, cooks  Self Care: Did the patient need help bathing, dressing, using the toilet or eating?  Independent  Indoor Mobility: Did the patient need assistance with walking from room to room (with or without device)? Independent  Stairs: Did the patient need assistance with internal or external stairs (with or without device)? Independent  Functional Cognition: Did the patient need help planning regular tasks such as shopping or remembering to take medications? Independent  Current Functional Level Cognition  Overall Cognitive Status: No family/caregiver present to determine baseline cognitive functioning Current Attention Level: Sustained Orientation Level: Oriented X4 Following Commands: Follows one step commands with increased time, Follows one step commands consistently Safety/Judgement: Decreased awareness of safety, Decreased awareness of deficits General Comments: At one point Pt meowed during session "I just like cats"    Extremity Assessment (includes Sensation/Coordination)  Upper Extremity Assessment: LUE deficits/detail, Generalized weakness, RUE deficits/detail RUE Deficits / Details: pain in R shoulder limiting ROM to approx 90 degrees FF RUE: Unable to fully assess due to pain LUE Deficits / Details: grasp grossly 3/5, decreased sensation, biceps 4/5, triceps 4/5, decreased coordination for fine and gross motor, shoulder FF requires increased time and concentration LUE Sensation: decreased light touch LUE Coordination: decreased gross motor, decreased fine motor  Lower Extremity Assessment: LLE deficits/detail LLE Deficits / Details: noted LLE strength assymetry compared  to Right gross motions 3+ /5  LLE Sensation: decreased proprioception LLE Coordination: decreased fine motor, decreased gross motor    ADLs  Overall ADL's : Needs assistance/impaired Eating/Feeding: Set up, Sitting Eating/Feeding Details (indicate cue type and reason): required assist for opening containers Grooming: Minimal assistance, Sitting, Wash/dry hands, Wash/dry face Grooming Details (indicate cue type and reason): min A for tasks that require BUE assist Upper Body Bathing: Minimal assistance Lower Body Bathing: Moderate assistance Upper Body Dressing : Minimal assistance, Sitting Lower Body Dressing: Moderate assistance, Sit to/from stand Lower Body Dressing Details (indicate cue type and reason): required assist for donning socks, able to pull up after OT initiated Toilet Transfer: Moderate assistance, +2 for physical assistance, +2 for safety/equipment, Ambulation Toilet Transfer Details (indicate cue type and reason): 2 person HHA for short ambulation Toileting- Clothing Manipulation and Hygiene: Moderate assistance, Sit to/from stand Functional mobility during ADLs: Moderate assistance, +2 for physical assistance, +2 for safety/equipment(2 person HHA) General ADL Comments: LUE decrease in function impacting performance as well as     Mobility  Overal bed mobility: Needs Assistance Bed Mobility: Supine to Sit Supine to sit: Min assist, HOB elevated General bed mobility comments: assist to steady; increased time and use of rail     Transfers  Overall transfer level: Needs assistance Equipment used: 2 person hand held assist Transfers: Sit to/from Stand Sit to Stand: +2 physical assistance, +2 safety/equipment, Min assist General transfer comment: assistance for balance and cues for safety    Ambulation / Gait / Stairs / Wheelchair Mobility  Ambulation/Gait Ambulation/Gait assistance: Mod assist, +2 physical assistance, +2 safety/equipment Gait Distance (Feet):  100 Feet Assistive device: 2 person hand held assist Gait Pattern/deviations: Decreased step length - left, Decreased step length - right, Step-through pattern, Narrow base of support General Gait Details: pt requires assistance for balance especially with horizontal head turns; L side  stronger today; near scissoring when distracted by environment Gait velocity: decreased Gait velocity interpretation: <1.31 ft/sec, indicative of household ambulator    Posture / Balance Balance Overall balance assessment: Needs assistance Sitting-balance support: No upper extremity supported, Feet supported Sitting balance-Leahy Scale: Fair Standing balance support: Bilateral upper extremity supported Standing balance-Leahy Scale: Poor Standing balance comment: dependent on external support from therapy team    Special needs/care consideration BiPAP/CPAP  N/a CPM n/a Continuous Drip IV n/a Dialysis  N/a Life Vest n/a Oxygen n/a Special Bed n/a Trach Size n/a Wound Vac n/a Skin intact              Bowel mgmt: continent Bladder mgmt: continent Diabetic mgmt Hgb A1c 5.8 impulsive with decreased safety awareness Seizure precautions   Previous Home Environment Living Arrangements: Spouse/significant other  Lives With: Spouse Available Help at Discharge: Family, Available 24 hours/day Type of Home: House Home Layout: One level Home Access: Stairs to enter Entrance Stairs-Rails: Right, Can reach both, Left Entrance Stairs-Number of Steps: 6 Bathroom Shower/Tub: Multimedia programmer: Handicapped height Bathroom Accessibility: Yes How Accessible: Accessible via wheelchair, Accessible via walker Home Care Services: No  Discharge Living Setting Plans for Discharge Living Setting: Patient's home, Lives with (comment)(spouse) Type of Home at Discharge: House Discharge Home Layout: One level Discharge Home Access: Stairs to enter Entrance Stairs-Rails: Right, Left, Can reach  both Entrance Stairs-Number of Steps: 6 Discharge Bathroom Shower/Tub: Walk-in shower Discharge Bathroom Toilet: Handicapped height Discharge Bathroom Accessibility: Yes How Accessible: Accessible via walker Does the patient have any problems obtaining your medications?: No  Social/Family/Support Systems Patient Roles: Spouse Contact Information: Judeen Hammans, wife Anticipated Caregiver: wife Anticipated Caregiver's Contact Information: [phone] Ability/Limitations of Caregiver: no limitations Caregiver Availability: 24/7 Discharge Plan Discussed with Primary Caregiver: Yes Is Caregiver In Agreement with Plan?: Yes Does Caregiver/Family have Issues with Lodging/Transportation while Pt is in Rehab?: No  Goals/Additional Needs Patient/Family Goal for Rehab: Mod I to supervision to PT, OT, and SLP Expected length of stay: [facility] 12 to 18 days Special Service Needs: Decreased safety awarenss; very impulsive Pt/Family Agrees to Admission and willing to participate: Yes Program Orientation Provided & Reviewed with Pt/Caregiver Including Roles  & Responsibilities: Yes  Decrease burden of Care through IP rehab admission: n/a  Possible need for SNF placement upon discharge:not anticipated   Patient Condition: This patient's condition remains as documented in the consult dated 04/27/2018, in which the Rehabilitation Physician determined and documented that the patient's condition is appropriate for intensive rehabilitative care in an inpatient rehabilitation facility. Will admit to inpatient rehab today.  Preadmission Screen Completed By:  Cleatrice Burke, 04/28/2018 2:23 PM ______________________________________________________________________   Discussed status with Dr. Letta Pate on 04/28/2018 at  74 and received telephone approval for admission today.  Admission Coordinator:  Cleatrice Burke, time 9678 Date 04/28/2018           Cosigned by: Charlett Blake,  MD at 04/28/2018 2:45 PM  Revision History

## 2018-04-28 NOTE — Consult Note (Addendum)
   [facility] CM Inpatient Consult   04/28/2018  William Jordan 1946/02/06 [id number]   Spoke with William Jordan at bedside about [facility] program services. He confirms he is going to discharge to CIR today and states " I am ready". William Jordan politely declines [facility] Management program services. Denies having any transportation, medication, disease management, or community case management needs.   He is agreeable to general EMMI calls however after he discharges from CIR.  Provided [facility] Care Management brochure with contact information and 24-hr nurse advice line magnet.   William Jordan expresses appreciation of visit.   Marthenia Rolling, MSN-Ed, RN,BSN [facility] Liaison [phone]

## 2018-04-28 NOTE — Progress Notes (Signed)
Inpatient Rehabilitation Admissions Coordinator  I have insurance approval to admit pt to inpt rehab today. I contacted Admitting team and they are in agreement to admit today. I contacted pt's wife by phone as she has left the building. She and pt are in agreement. I will make the arrangements to admit today. SW, Kathlee Nations, made aware.  Danne Baxter, RN, MSN Rehab Admissions Coordinator [phone] 04/28/2018 2:14 PM '

## 2018-04-28 NOTE — Progress Notes (Signed)
Inpatient Rehabilitation Admissions Coordinator  I met with patient and his wife at bedside to discuss goals and expectations of an inpt rehab admit. 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 Recent admit to [facility] for angioedema with meds changes pta. Patient and wife prefer an inpt rehab admit rather than SNF due to his medical complexity recently. I await insurance approval to possibly admit pt today.  Danne Baxter, RN, MSN Rehab Admissions Coordinator [phone] 04/28/2018 12:44 PM

## 2018-04-29 ENCOUNTER — Inpatient Hospital Stay (HOSPITAL_COMMUNITY): Payer: PPO | Admitting: Occupational Therapy

## 2018-04-29 ENCOUNTER — Inpatient Hospital Stay (HOSPITAL_COMMUNITY): Payer: PPO

## 2018-04-29 ENCOUNTER — Inpatient Hospital Stay (HOSPITAL_COMMUNITY): Payer: PPO | Admitting: Speech Pathology

## 2018-04-29 DIAGNOSIS — I69398 Other sequelae of cerebral infarction: Secondary | ICD-10-CM

## 2018-04-29 DIAGNOSIS — I63511 Cerebral infarction due to unspecified occlusion or stenosis of right middle cerebral artery: Secondary | ICD-10-CM

## 2018-04-29 DIAGNOSIS — I69354 Hemiplegia and hemiparesis following cerebral infarction affecting left non-dominant side: Principal | ICD-10-CM

## 2018-04-29 DIAGNOSIS — R269 Unspecified abnormalities of gait and mobility: Secondary | ICD-10-CM

## 2018-04-29 LAB — CBC WITH DIFFERENTIAL/PLATELET
ABS IMMATURE GRANULOCYTES: 0.3 10*3/uL — AB (ref 0.0–0.1)
BASOS ABS: 0.1 10*3/uL (ref 0.0–0.1)
BASOS PCT: 1 %
Eosinophils Absolute: 0.3 10*3/uL (ref 0.0–0.7)
Eosinophils Relative: 3 %
HCT: 38.5 % — ABNORMAL LOW (ref 39.0–52.0)
HEMOGLOBIN: 12.4 g/dL — AB (ref 13.0–17.0)
IMMATURE GRANULOCYTES: 3 %
LYMPHS PCT: 25 %
Lymphs Abs: 2.3 10*3/uL (ref 0.7–4.0)
MCH: 28.2 pg (ref 26.0–34.0)
MCHC: 32.2 g/dL (ref 30.0–36.0)
MCV: 87.7 fL (ref 78.0–100.0)
MONO ABS: 1.1 10*3/uL — AB (ref 0.1–1.0)
Monocytes Relative: 12 %
NEUTROS ABS: 5.1 10*3/uL (ref 1.7–7.7)
NEUTROS PCT: 56 %
Platelets: 299 10*3/uL (ref 150–400)
RBC: 4.39 MIL/uL (ref 4.22–5.81)
RDW: 14.4 % (ref 11.5–15.5)
WBC: 9.1 10*3/uL (ref 4.0–10.5)

## 2018-04-29 LAB — COMPREHENSIVE METABOLIC PANEL
ALBUMIN: 3.3 g/dL — AB (ref 3.5–5.0)
ALK PHOS: 118 U/L (ref 38–126)
ALT: 15 U/L (ref 0–44)
AST: 19 U/L (ref 15–41)
Anion gap: 7 (ref 5–15)
BUN: 16 mg/dL (ref 8–23)
CALCIUM: 8.7 mg/dL — AB (ref 8.9–10.3)
CHLORIDE: 100 mmol/L (ref 98–111)
CO2: 26 mmol/L (ref 22–32)
CREATININE: 0.86 mg/dL (ref 0.61–1.24)
GFR calc Af Amer: >60 mL/min (ref >60)
GFR calc non Af Amer: >60 mL/min (ref >60)
GLUCOSE: 101 mg/dL — AB (ref 70–99)
Potassium: 3.9 mmol/L (ref 3.5–5.1)
SODIUM: 133 mmol/L — AB (ref 135–145)
Total Bilirubin: 0.5 mg/dL (ref 0.3–1.2)
Total Protein: 6.7 g/dL (ref 6.5–8.1)

## 2018-04-29 MED ORDER — TRAZODONE HCL 50 MG PO TABS
50.0000 mg | ORAL_TABLET | Freq: Every day | ORAL | Status: DC
  Administered 2018-04-29: 50 mg via ORAL
  Filled 2018-04-29: qty 1

## 2018-04-29 NOTE — H&P (Addendum)
Physical Medicine and Rehabilitation Admission H&P        Chief Complaint  Patient presents with  . Weakness  : HPI: William Jordan is a 72 year old right-handed male with history of hypertension, idiopathic peripheral neuropathy with spinal cord stimulator 2016 per Dr. Melina Schools and chronic back pain, angioedema and diastolic congestive heart failure.  Per chart review patient lives with spouse.  Independent driving prior to admission.  One level home with 6 steps to entry.  Patient with recent discharge from [facility] for angioedema from ACE I. presented 04/25/2018 with left-sided weakness.  While in the emergency department he developed tonic-clonic jerking of his left arm.  CT the head showed increase conspicuity of multiple small cortical infarctions in the right posterior occipital and parietal lobes.  CT angiogram of head and neck showed atherosclerotic changes without significant stenosis.  MRI not completed due to nerve stimulator.  EEG negative for seizure.  Echocardiogram with ejection fraction of 11% and systolic function was normal.  Venous Dopplers lower extremities negative for DVT.  TEE completed 04/27/2018 with ejection fraction 60%.  No thrombus or PFO.  Negative bubble study.  Underwent loop recorder placement.  Latest follow-up cranial CT scan 04/27/2018 showing unchanged appearance of multiple areas of acute disc subacute ischemia in the right parietal and occipital lobes.  No acute hemorrhage.  Currently maintained on aspirin and Plavix for CVA prophylaxis x3 months then Plavix alone.  Subcutaneous heparin for DVT prophylaxis.  Patient with intermittent bouts of delirium and confusion that have improved.  Maintain on a regular diet.  Physical and occupational therapy evaluations completed with recommendations of physical medicine rehab consult.  Patient was admitted for a comprehensive rehab program.   Review of Systems  Constitutional: Negative for chills and fever.   HENT: Negative for hearing loss.   Eyes: Negative for blurred vision and double vision.  Respiratory: Negative for cough and shortness of breath.   Cardiovascular: Positive for leg swelling. Negative for chest pain and palpitations.  Gastrointestinal: Positive for constipation. Negative for nausea and vomiting.  Genitourinary: Positive for urgency. Negative for dysuria and hematuria.  Musculoskeletal: Positive for back pain.  Skin: Negative for rash.  Neurological: Positive for focal weakness.  Psychiatric/Behavioral:       Anxiety  All other systems reviewed and are negative.   Past Medical History:  Diagnosis Date  . Angioedema      from Benazepril reaction  . Anxiety      takes Celexa  . Arthritis    . Chronic back pain    . Enlarged prostate    . History of blood transfusion    . Hypertension    . Neuropathy           Past Surgical History:  Procedure Laterality Date  . COLONOSCOPY        one polyp removed - benign  . ELBOW SURGERY Right    . HERNIA REPAIR        umbilical  . JOINT REPLACEMENT Left      knee, x 2  . JOINT REPLACEMENT Right      hip  . JOINT REPLACEMENT Left      partial shoulder  . LOOP RECORDER INSERTION N/A 04/27/2018    Procedure: LOOP RECORDER INSERTION;  Surgeon: Evans Lance, MD;  Location: [facility];  Service: Cardiovascular;  Laterality: N/A;  . SPINAL CORD STIMULATOR INSERTION N/A 09/28/2014    Procedure: LUMBAR SPINAL CORD STIMULATOR INSERTION;  Surgeon:  Melina Schools, MD;  Location: [facility] [facility];  Service: Orthopedics;  Laterality: N/A;  . TEE WITHOUT CARDIOVERSION N/A 04/27/2018    Procedure: TRANSESOPHAGEAL ECHOCARDIOGRAM (TEE);  Surgeon: Larey Dresser, MD;  Location: [facility]COPY;  Service: Cardiovascular;  Laterality: N/A;  . TONSILLECTOMY      . VASECTOMY             Family History  Problem Relation Age of Onset  . Heart disease Father      Social History:  reports that he has been smoking e-cigarettes. He has quit  using smokeless tobacco.  His smokeless tobacco use included snuff. He reports that he drinks alcohol. He reports that he does not use drugs. Allergies:       Allergies  Allergen Reactions  . Ace Inhibitors Other (See Comments)      Angioedema (throat swelling)  . Morphine Other (See Comments)      Causes confusion and "makes me crazy," per the patient  . Sulfa Antibiotics Other (See Comments)      Unknown reaction          Medications Prior to Admission  Medication Sig Dispense Refill  . alfuzosin (UROXATRAL) 10 MG 24 hr tablet Take 10 mg by mouth daily after supper.      Marland Kitchen amLODipine (NORVASC) 10 MG tablet Take 1 tablet (10 mg total) by mouth daily. 30 tablet 11  . celecoxib (CELEBREX) 100 MG capsule Take 100 mg by mouth 2 (two) times daily.       . cetirizine (ZYRTEC) 10 MG tablet Take 10 mg by mouth 2 (two) times daily.      . cholecalciferol (VITAMIN D) 1000 units tablet Take 2,000 Units by mouth daily.      . citalopram (CELEXA) 20 MG tablet Take 20 mg by mouth daily.      . cloNIDine (CATAPRES) 0.2 MG tablet Take 0.2 mg by mouth 2 (two) times daily.      . colchicine 0.6 MG tablet Take 0.6 mg by mouth 3 (three) times daily as needed (for gout flares).       . docusate sodium (COLACE) 100 MG capsule Take 2 capsules (200 mg total) by mouth at bedtime. (Patient taking differently: Take 100 mg by mouth at bedtime. ) 30 capsule 0  . gabapentin (NEURONTIN) 600 MG tablet Take 300 mg by mouth 3 (three) times daily.       Marland Kitchen omeprazole (PRILOSEC) 20 MG capsule Take 20 mg by mouth daily.       . Oxycodone HCl 20 MG TABS Take 1 tablet (20 mg total) by mouth every 8 (eight) hours as needed. (Patient taking differently: Take 20 mg by mouth every 4 (four) hours as needed (pain). ) 1 tablet 0  . Saw Palmetto, Serenoa repens, (SAW PALMETTO PO) Take 1 capsule by mouth daily.      Marland Kitchen morphine (MS CONTIN) 15 MG 12 hr tablet Take 1 tablet (15 mg total) by mouth every 12 (twelve) hours. (Patient not  taking: Reported on 12/10/2017) 60 tablet 0  . polyethylene glycol (MIRALAX / GLYCOLAX) packet Take 17 g by mouth daily. (Patient not taking: Reported on 04/25/2018) 14 each 0  . vitamin B-12 (CYANOCOBALAMIN) 100 MCG tablet Take 1 tablet (100 mcg total) by mouth daily. (Patient not taking: Reported on 04/25/2018) 30 tablet 1      Drug Regimen Review Drug regimen was reviewed and remains appropriate with no significant issues identified   Home: Home Living Family/patient expects to be  discharged to:: Private residence Living Arrangements: Spouse/significant other Available Help at Discharge: Family, Available 24 hours/day Type of Home: House Home Access: Stairs to enter [employer] of Steps: 6 Entrance Stairs-Rails: Right, Can reach both, Left Home Layout: One level Bathroom Shower/Tub: Multimedia programmer: Handicapped height Bathroom Accessibility: Yes Home Equipment: Environmental consultant - 2 wheels, Hand held shower head, Grab bars - tub/shower, Shower seat - built in, [place] - single point, Environmental consultant - 4 wheels   Functional History: Prior Function Level of Independence: Independent Comments: drives, cooks   Functional Status:  Mobility: Bed Mobility Overal bed mobility: Needs Assistance Bed Mobility: Supine to Sit Supine to sit: Min assist, HOB elevated General bed mobility comments: increased time and effort to perform, patient with impulsivity, required cues to direct to task Transfers Overall transfer level: Needs assistance Equipment used: 2 person hand held assist Transfers: Sit to/from Stand Sit to Stand: Mod assist, +2 physical assistance, +2 safety/equipment General transfer comment: min A for boost, mod A +2 once in standing with L weak side buckling.  Ambulation/Gait Ambulation/Gait assistance: Max assist, +2 safety/equipment Gait Distance (Feet): 16 Feet Assistive device: 2 person hand held assist Gait Pattern/deviations: Step-to pattern, Decreased step  length - left, Decreased step length - right, Staggering left General Gait Details: Patient with inability to control LLE weight shift and stride. Postural facilitation via wrap around support to elicit upright posture and manual weight shift. Multi modal cues to initiate step. Increased time and effort. Gait velocity: decreased Gait velocity interpretation: <1.31 ft/sec, indicative of household ambulator   ADL: ADL Overall ADL's : Needs assistance/impaired Eating/Feeding: Set up, Sitting Eating/Feeding Details (indicate cue type and reason): required assist for opening containers Grooming: Minimal assistance, Sitting, Wash/dry hands, Wash/dry face Grooming Details (indicate cue type and reason): min A for tasks that require BUE assist Upper Body Bathing: Minimal assistance Lower Body Bathing: Moderate assistance Upper Body Dressing : Minimal assistance, Sitting Lower Body Dressing: Moderate assistance, Sit to/from stand Lower Body Dressing Details (indicate cue type and reason): required assist for donning socks, able to pull up after OT initiated Toilet Transfer: Moderate assistance, +2 for physical assistance, +2 for safety/equipment, Ambulation Toilet Transfer Details (indicate cue type and reason): 2 person HHA for short ambulation Toileting- Clothing Manipulation and Hygiene: Moderate assistance, Sit to/from stand Functional mobility during ADLs: Moderate assistance, +2 for physical assistance, +2 for safety/equipment(2 person HHA) General ADL Comments: LUE decrease in function impacting performance as well as    Cognition: Cognition Overall Cognitive Status: No family/caregiver present to determine baseline cognitive functioning Orientation Level: Oriented X4 Cognition Arousal/Alertness: Awake/alert Behavior During Therapy: WFL for tasks assessed/performed, Impulsive Overall Cognitive Status: No family/caregiver present to determine baseline cognitive functioning Area of  Impairment: Attention, Following commands, Safety/judgement, Awareness, Problem solving Current Attention Level: Sustained Following Commands: Follows one step commands with increased time Safety/Judgement: Decreased awareness of safety, Decreased awareness of deficits Awareness: Emergent Problem Solving: Difficulty sequencing, Requires verbal cues, Requires tactile cues General Comments: At one point Pt meowed during session "I just like cats"   Physical Exam: Blood pressure (!) 171/108, pulse 70, temperature 97.8 F (36.6 C), temperature source Oral, resp. rate 18, height '5\' 10"'  (1.778 m), weight 83.9 kg, SpO2 98 %. Physical Exam  Vitals reviewed. Constitutional: He is oriented to person, place, and time.  HENT:  Head: Normocephalic.  Eyes: EOM are normal. Right eye exhibits no discharge. Left eye exhibits no discharge.  Neck: Normal range of motion. Neck supple. No thyromegaly  present.  Cardiovascular: Normal rate, regular rhythm and normal heart sounds.  Respiratory: Effort normal and breath sounds normal. No respiratory distress.  GI: Soft. Bowel sounds are normal. He exhibits no distension.  Neurological: He is alert and oriented to person, place, and time.  Speech is a bit dysarthric but intelligible.  Follows full commands.  Fair awareness of deficits  Skin: Skin is warm and dry.   4/5 in the left deltoid, bicep, tricep, grip, hip flexor, knee extensor, ankle dorsiflexor 5/5 in the right deltoid bicep tricep grip hip flexor knee extension ankle dorsiflexor.   Lab Results Last 48 Hours        Results for orders placed or performed during the hospital encounter of 04/25/18 (from the past 48 hour(s))  Glucose, capillary     Status: Abnormal    Collection Time: 04/26/18  4:49 PM  Result Value Ref Range    Glucose-Capillary 114 (H) 70 - 99 mg/dL    Comment 1 QC Due      Comment 2 Notify RN      Comment 3 Document in Chart    CBC     Status: Abnormal    Collection Time:  04/27/18 10:59 AM  Result Value Ref Range    WBC 15.5 (H) 4.0 - 10.5 K/uL    RBC 4.32 4.22 - 5.81 MIL/uL    Hemoglobin 12.5 (L) 13.0 - 17.0 g/dL    HCT 38.3 (L) 39.0 - 52.0 %    MCV 88.7 78.0 - 100.0 fL    MCH 28.9 26.0 - 34.0 pg    MCHC 32.6 30.0 - 36.0 g/dL    RDW 14.4 11.5 - 15.5 %    Platelets 324 150 - 400 K/uL      Comment: Performed at [facility], [address], [place], [place] [address]  Creatinine, serum     Status: None    Collection Time: 04/27/18 10:59 AM  Result Value Ref Range    Creatinine, Ser 0.99 0.61 - 1.24 mg/dL    GFR calc non Af Amer >60 >60 mL/min    GFR calc Af Amer >60 >60 mL/min      Comment: (NOTE) The eGFR has been calculated using the CKD EPI equation. This calculation has not been validated in all clinical situations. eGFR's persistently <60 mL/min signify possible Chronic Kidney Disease. Performed at [facility], [place] [address], [place], [place] [address]         Imaging Results (Last 48 hours)  Ct Head Wo Contrast   Result Date: 04/27/2018 CLINICAL DATA:  Stroke follow-up EXAM: CT HEAD WITHOUT CONTRAST TECHNIQUE: Contiguous axial images were obtained from the base of the skull through the vertex without intravenous contrast. COMPARISON:  Head CT 04/26/2018 FINDINGS: Brain: There is no mass, hemorrhage or extra-axial collection. There is generalized atrophy without lobar predilection. Unchanged appearance of multiple right occipital and parietal areas of loss of gray-white differentiation. Unchanged old left basal ganglia lacunar infarct. There is hypoattenuation of the periventricular white matter, most commonly indicating chronic ischemic microangiopathy. Vascular: No abnormal hyperdensity of the major intracranial arteries or dural venous sinuses. No intracranial atherosclerosis. Skull: The visualized skull base, calvarium and extracranial soft tissues are normal. Sinuses/Orbits: No fluid levels or advanced mucosal thickening of the  visualized paranasal sinuses. No mastoid or middle ear effusion. The orbits are normal. IMPRESSION: 1. No acute hemorrhage. 2. Unchanged appearance of multiple areas of acute to subacute ischemia in the right parietal and occipital  lobes. Electronically Signed   By: Ulyses Jarred M.D.   On: 04/27/2018 19:18             Medical Problem List and Plan: 1.  Left-sided weakness secondary to right parietal occipital lobe infarctions.  Status post loop recorder 2.  DVT Prophylaxis/Anticoagulation: Subcutaneous heparin.  Monitor for any bleeding episodes 3. Pain Management/chronic back pain/idiopathic peripheral neuropathy with spinal cord stimulator: Neurontin 300 mg 3 times daily, oxycodone as needed 4. Mood: Rozerem 8 mg nightly 5. Neuropsych: This patient is capable of making decisions on his own behalf. 6. Skin/Wound Care: Routine skin checks 7. Fluids/Electrolytes/Nutrition: Routine in and outs with follow-up chemistries 8.  Hypertension 9.  Diastolic congestive heart failure.  Monitor for any signs of fluid overload 10.  Angioedema.  Patient recently discharged from [facility] for treatment of angioedema from ACE inhibitor.  Continue to monitor 11.  Hyperlipidemia.  Lipitor  Post Admission Physician Evaluation: 1. Functional deficits secondary  to RIght Parieto-occipital infarct. 2. Patient admitted to receive collaborative, interdisciplinary care between the physiatrist, rehab nursing staff, and therapy team. 3. Patient's level of medical complexity and substantial therapy needs in context of that medical necessity cannot be provided at a lesser intensity of care. 4. Patient has experienced substantial functional loss from his/her baseline.Judging by the patient's diagnosis, physical exam, and functional history, the patient has potential for functional progress which will result in measurable gains while on inpatient rehab.  These gains will be of substantial and practical use upon discharge  in facilitating mobility and self-care at the household level. 5. Physiatrist will provide 24 hour management of medical needs as well as oversight of the therapy plan/treatment and provide guidance as appropriate regarding the interaction of the two. 6. 24 hour rehab nursing will assist in the management of  bladder management, bowel management, safety, skin/wound care, disease management, medication administration, pain management and patient education  and help integrate therapy concepts, techniques,education, etc. 7. PT will assess and treat for:pre gait, gait training, endurance , safety, equipment, neuromuscular re education  .  Goals are: Sup transfers may be amb only with therapy at D/C. 8. OT will assess and treat for  . ADLs, Cognitive perceptual skills, Neuromuscular re education, safety, endurance, equipment Goals are: supervision.  9. SLP will assess and treat for cognition, communication   .  Goals are: independent. 10. Case Management and Social Worker will assess and treat for psychological issues and discharge planning. 11. Team conference will be held weekly to assess progress toward goals and to determine barriers to discharge. 12.  Patient will receive at least 3 hours of therapy per day at least 5 days per week. 13. ELOS and Prognosis: 12-18d good      "I have personally performed a face to face diagnostic evaluation of this patient.  Additionally, I have reviewed and concur with the physician assistant's documentation above." Charlett Blake M.D. [facility] FAAPM&R (Sports Med, Neuromuscular Med) Diplomate Am Board of Electrodiagnostic Med  Elizabeth Sauer 04/28/2018

## 2018-04-29 NOTE — Evaluation (Signed)
Speech Language Pathology Assessment and Plan  Patient Details  Name: William Jordan MRN: [id number] Date of Birth: Apr 15, 1946  Today's Date: 04/29/2018 SLP Individual Time: 0900-1000 SLP Individual Time Calculation (min): 60 min   Problem List:  Patient Active Problem List   Diagnosis Date Noted  . Right middle cerebral artery stroke ([place]) 04/28/2018  . Elevated troponin   . Dehydration   . 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[place])   . Benign essential HTN   . Hyponatremia   . Non-ST elevation (NSTEMI) myocardial infarction ([place])   . Ventricular tachycardia ([place])   . CVA (cerebral vascular accident) ([place]) 04/25/2018  . Opiate overdose ([place]) 12/10/2017  . Acute kidney injury ([place]) 12/10/2017  . Kidney lesion, native, right 12/10/2017  . Abuse, drug or alcohol ([place]) 10/05/2015  . PNA (pneumonia) 10/05/2015  . Bacteremia due to Staphylococcus aureus 10/05/2015  . Chronic pain syndrome 10/02/2015  . Acute respiratory failure with hypoxia ([place])   . Aspiration pneumonia ([place])   . Hematemesis   . Respiratory failure ([place]) 09/26/2015  . Encephalopathy, unspecified   . Chronic back pain   . Muscle spasm of back   . Altered mental status 08/14/2015  . AKI (acute kidney injury) ([place]) 08/14/2015  . Anemia 08/14/2015  . Altered mental state 08/14/2015  . Pain in left wrist   . Gout attack 06/18/2015  . Septic joint of left wrist ([place]) 06/16/2015  . History of hypertension 06/16/2015  . Leukocytosis 06/16/2015  . Septic joint ([place]) 06/16/2015  . Acute right ankle pain 06/10/2015  . HCAP (healthcare-associated pneumonia) 06/09/2015  . Hypokalemia 06/09/2015  . Acute renal failure syndrome ([place])   . Acute encephalopathy   . Staphylococcus aureus bacteremia with sepsis ([place]) 06/07/2015  . Constipation 06/06/2015  . Toxic metabolic encephalopathy 73/56/7014  . Hyperkalemia 06/05/2015  . Low back pain with sciatica 04/03/2015  . H/O total hip arthroplasty 04/03/2015  . Idiopathic peripheral  neuropathy 09/29/2014  . Chronic pain 09/28/2014  . DDD (degenerative disc disease), lumbar 12/30/2012  . Degenerative arthritis of thoracic spine 12/30/2012  . Localized osteoarthrosis 12/13/2012  . Chronic pain associated with significant psychosocial dysfunction 12/13/2012  . Polypharmacy 12/13/2012  . Long term current use of opiate analgesic 12/13/2012   Past Medical History:  Past Medical History:  Diagnosis Date  . Angioedema    from Benazepril reaction  . Anxiety    takes Celexa  . Arthritis   . Chronic back pain   . Enlarged prostate   . History of blood transfusion   . Hypertension   . Neuropathy    Past Surgical History:  Past Surgical History:  Procedure Laterality Date  . COLONOSCOPY     one polyp removed - benign  . ELBOW SURGERY Right   . HERNIA REPAIR     umbilical  . JOINT REPLACEMENT Left    knee, x 2  . JOINT REPLACEMENT Right    hip  . JOINT REPLACEMENT Left    partial shoulder  . LOOP RECORDER INSERTION N/A 04/27/2018   Procedure: LOOP RECORDER INSERTION;  Surgeon: Evans Lance, MD;  Location: [facility];  Service: Cardiovascular;  Laterality: N/A;  . SPINAL CORD STIMULATOR INSERTION N/A 09/28/2014   Procedure: LUMBAR SPINAL CORD STIMULATOR INSERTION;  Surgeon: Melina Schools, MD;  Location: [facility];  Service: Orthopedics;  Laterality: N/A;  . TEE WITHOUT CARDIOVERSION N/A 04/27/2018   Procedure: TRANSESOPHAGEAL ECHOCARDIOGRAM (TEE);  Surgeon: Larey Dresser, MD;  Location: [facility];  Service: Cardiovascular;  Laterality: N/A;  . TONSILLECTOMY    . VASECTOMY      Assessment / Plan / Recommendation Clinical Impression William Jordan is a 72 year old right-handed male with history of hypertension, idiopathic peripheral neuropathy with spinal cord stimulator 2016 per Dr. Melina Schools and chronic back pain, angioedema and diastolic congestive heart failure. Per chart review patient lives with spouse. Independent driving prior to admission.  One level home with 6 steps to entry. Patient with recent discharge from [facility] for angioedema from ACE I.presented 04/25/2018 with left-sided weakness. While in the emergency department he developed tonic-clonic jerking of his left arm. CT the head showed increase conspicuityof multiple small cortical infarctions in the right posterior occipital and parietal lobes. CT angiogram of head and neck showed atherosclerotic changes without significant stenosis. Underwent loop recorder placement. Latest follow-up cranial CT scan 04/27/2018 showing unchanged appearance of multiple areas of acute disc subacute ischemia in the right parietal and occipital lobes. Currently maintained on aspirin and Plavix for CVA prophylaxis x3 months then Plavix alone. Subcutaneous heparin for DVT prophylaxis. Patient with intermittent bouts of delirium and confusion that have improved. Maintain on a regular diet. Patient was admitted for a comprehensive rehab program on 04/28/18.   Comprehensive cognitive linguistic evaluation completed on 04/29/18. Pt presents with functional ability as evidenced by score of 22 out of 22 on MOCA Blind. Pt also demonstrates good safety awareness/anticipatory awareness by listing activities that are not safe to perform at home. Wife was present and reports substantial delirium/confusion during initial hospitalization all of that has cleared. Per chart review, it appears that pt suffers from delirium with each hospitalization even dating by to 2017. Wife agrees. No further skilled ST is indicated at this time, all education completed and all questions answered to pt and wife's satisfaction.    Skilled Therapeutic Interventions          Skilled treatment session focused on further assessing safety awareness and anticipatory awareness. Pt was independent with listing activities that would not be safe to perform etc. Education provided to wife and pt on s/s of CVA and prevention.     SLP Assessment  Patient does not need any further Speech [place] Pathology Services    Recommendations  Patient destination: Home Follow up Recommendations: None Equipment Recommended: None recommended by SLP           Pain    Prior Functioning Cognitive/Linguistic Baseline: Within functional limits Type of Home: House  Lives With: Spouse Available Help at Discharge: Family;Available 24 hours/day Vocation: Retired  Function:    Cognition Comprehension Comprehension assist level: Follows complex conversation/direction with no assist;Follows complex conversation/direction with extra time/assistive device  Expression   Expression assist level: Expresses complex ideas: With no assist;Expresses complex ideas: With extra time/assistive device  Social Interaction Social Interaction assist level: Interacts appropriately with others - No medications needed.  Problem Solving Problem solving assist level: Solves complex problems: Recognizes & self-corrects  Memory Memory assist level: Complete Independence: No helper   Short Term Goals: No short term goals set  Refer to Care Plan for Long Term Goals  Recommendations for other services: None   Discharge Criteria: Patient will be discharged from SLP if patient refuses treatment 3 consecutive times without medical reason, if treatment goals not met, if there is a change in medical status, if patient makes no progress towards goals or if patient is discharged from hospital.  The above assessment, treatment plan, treatment alternatives and goals were discussed and mutually  agreed upon: by patient and by family  Darl Kuss 04/29/2018, 11:53 AM
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
## 2018-04-29 NOTE — Progress Notes (Signed)
Patient information reviewed and entered into eRehab system by Wylan Gentzler, RN, CRRN, PPS Coordinator.  Information including medical coding and functional independence measure will be reviewed and updated through discharge.     Per nursing patient was given "Data Collection Information Summary for Patients in Inpatient Rehabilitation Facilities with attached "Privacy Act Statement-Health Care Records" upon admission.  

## 2018-04-29 NOTE — Care Management Note (Signed)
[facility] Individual Statement of Services  Patient Name:  William Jordan  Date:  04/29/2018  Welcome to the [facility].  Our goal is to provide you with an individualized program based on your diagnosis and situation, designed to meet your specific needs.  With this comprehensive rehabilitation program, you will be expected to participate in at least 3 hours of rehabilitation therapies Monday-Friday, with modified therapy programming on the weekends.  Your rehabilitation program will include the following services:  Physical Therapy (PT), Occupational Therapy (OT), Speech Therapy (ST), 24 hour per day rehabilitation nursing, Case Management (Social Worker), Rehabilitation Medicine, Nutrition Services and Pharmacy Services  Weekly team conferences will be held on Wednesday to discuss your progress.  Your Social Worker will talk with you frequently to get your input and to update you on team discussions.  Team conferences with you and your family in attendance may also be held.  Expected length of stay: 7-9 days Overall anticipated outcome: independent with device  Depending on your progress and recovery, your program may change. Your Social Worker will coordinate services and will keep you informed of any changes. Your Social Worker's name and contact numbers are listed  below.  The following services may also be recommended but are not provided by the [facility] will be made to provide these services after discharge if needed.  Arrangements include referral to agencies that provide these services.  Your insurance has been verified to be:  [employer] Your primary doctor is:  Vassie Loll Eyk  Pertinent information will be shared with your doctor and your insurance company.  Social Worker:  Ovidio Kin, [place] or ([phone]  Information discussed with and copy given to patient by: Elease Hashimoto, 04/29/2018, 8:53 AM

## 2018-04-29 NOTE — Discharge Instructions (Signed)
Inpatient Rehab Discharge Instructions  William Jordan Discharge date and time: No discharge date for patient encounter.   Activities/Precautions/ Functional Status: Activity: activity as tolerated Diet: regular diet Wound Care: none needed Functional status:  ___ No restrictions     ___ Walk up steps independently ___ 24/7 supervision/assistance   ___ Walk up steps with assistance ___ Intermittent supervision/assistance  ___ Bathe/dress independently ___ Walk with walker     __x_ Bathe/dress with assistance ___ Walk Independently    ___ Shower independently ___ Walk with assistance    ___ Shower with assistance ___ No alcohol     ___ Return to work/school ________  Special Instructions: No driving STROKE/TIA DISCHARGE INSTRUCTIONS SMOKING Cigarette smoking nearly doubles your risk of having a stroke & is the single most alterable risk factor  If you smoke or have smoked in the last 12 months, you are advised to quit smoking for your health.  Most of the excess cardiovascular risk related to smoking disappears within a year of stopping.  Ask you doctor about anti-smoking medications  [place] Quit Line: 1-800-QUIT NOW  Free Smoking Cessation Classes [phone]  CHOLESTEROL Know your levels; limit fat & cholesterol in your diet  Lipid Panel     Component Value Date/Time   CHOL 157 04/26/2018 0355   TRIG 123 04/26/2018 0355   HDL 60 04/26/2018 0355   CHOLHDL 2.6 04/26/2018 0355   VLDL 25 04/26/2018 0355   LDLCALC 72 04/26/2018 0355      Many patients benefit from treatment even if their cholesterol is at goal.  Goal: Total Cholesterol (CHOL) less than 160  Goal:  Triglycerides (TRIG) less than 150  Goal:  HDL greater than 40  Goal:  LDL (LDLCALC) less than 100   BLOOD PRESSURE American Stroke Association blood pressure target is less that 120/80 mm/Hg  Your discharge blood pressure is:  BP: (!) 154/86  Monitor your blood pressure  Limit your salt and alcohol  intake  Many individuals will require more than one medication for high blood pressure  DIABETES (A1c is a blood sugar average for last 3 months) Goal HGBA1c is under 7% (HBGA1c is blood sugar average for last 3 months)  Diabetes: No known diagnosis of diabetes    Lab Results  Component Value Date   HGBA1C 5.8 (H) 04/26/2018     Your HGBA1c can be lowered with medications, healthy diet, and exercise.  Check your blood sugar as directed by your physician  Call your physician if you experience unexplained or low blood sugars.  PHYSICAL ACTIVITY/REHABILITATION Goal is 30 minutes at least 4 days per week  Activity: Increase activity slowly, Therapies: Physical Therapy: Home Health Return to work:   Activity decreases your risk of heart attack and stroke and makes your heart stronger.  It helps control your weight and blood pressure; helps you relax and can improve your mood.  Participate in a regular exercise program.  Talk with your doctor about the best form of exercise for you (dancing, walking, swimming, cycling).  DIET/WEIGHT Goal is to maintain a healthy weight  Your discharge diet is:  Diet Order            Diet Heart Room service appropriate? Yes; Fluid consistency: Thin  Diet effective now              liquids Your height is:  Height: 5\' 10"  (177.8 cm) Your current weight is: Weight: 82.1 kg Your Body Mass Index (BMI) is:  BMI (Calculated):  25.97  Following the type of diet specifically designed for you will help prevent another stroke.  Your goal weight range is:    Your goal Body Mass Index (BMI) is 19-24.  Healthy food habits can help reduce 3 risk factors for stroke:  High cholesterol, hypertension, and excess weight.  RESOURCES Stroke/Support Group:  Call [phone]   STROKE EDUCATION PROVIDED/REVIEWED AND GIVEN TO PATIENT Stroke warning signs and symptoms How to activate emergency medical system (call 911). Medications prescribed at discharge. Need for  follow-up after discharge. Personal risk factors for stroke. Pneumonia vaccine given:  Flu vaccine given:  My questions have been answered, the writing is legible, and I understand these instructions.  I will adhere to these goals & educational materials that have been provided to me after my discharge from the hospital.      My questions have been answered and I understand these instructions. I will adhere to these goals and the provided educational materials after my discharge from the hospital.  Patient/Caregiver Signature _______________________________ Date __________  Clinician Signature _______________________________________ Date __________  Please bring this form and your medication list with you to all your follow-up doctor's appointments.

## 2018-04-29 NOTE — IPOC Note (Signed)
Overall Plan of Care [facility]) Patient Details Name: William Jordan MRN: [id number] DOB: 1946-01-23  Admitting Diagnosis: <principal problem not specified>  Hospital Problems: Active Problems:   Right middle cerebral artery stroke [facility])     Functional Problem List: Nursing Pain  PT Balance, Pain, Motor, Sensory  OT Balance, Perception, Vision  SLP    TR         Basic ADL's: OT Grooming, Bathing, Dressing, Toileting     Advanced  ADL's: OT Simple Meal Preparation     Transfers: PT Bed Mobility, Bed to Chair, Car, Furniture, Floor  OT Toilet, Tub/Shower     Locomotion: PT Ambulation, Emergency planning/management officer, Stairs     Additional Impairments: OT None  SLP        TR      Anticipated Outcomes Item Anticipated Outcome  Self Feeding I  Swallowing      Basic self-care  mod I dressing, supervision showering  Toileting  mod I   Bathroom Transfers mod I to toilet, S to shower  Bowel/Bladder  Remain continent of bladder/bowels  Transfers  modified independent basic, supervision car and floor  Locomotion  supervision gait controlled and community env x 250' and up/down 12 steps; modified independent gait x 50' home env  Communication     Cognition     Pain  Has neuropathy pain tolerance 3  Safety/Judgment  Free of falls or injuries, maintain safety parameter and protocol   Therapy Plan: PT Intensity: Minimum of 1-2 x/day ,45 to 90 minutes PT Frequency: 5 out of 7 days PT Duration Estimated Length of Stay: 7-9 OT Intensity: Minimum of 1-2 x/day, 45 to 90 minutes OT Frequency: 5 out of 7 days OT Duration/Estimated Length of Stay: 7-9 days      Team Interventions: Nursing Interventions Patient/Family Education, Pain Management, Medication Management, Discharge Planning  PT interventions Ambulation/gait training, Community reintegration, DME/adaptive equipment instruction, Neuromuscular re-education, Psychosocial support, Stair training, UE/LE Strength taining/ROM,  Wheelchair propulsion/positioning, Training and development officer, Discharge planning, Pain management, Therapeutic Activities, UE/LE Coordination activities, Functional mobility training, Patient/family education, Splinting/orthotics, Therapeutic Exercise, Visual/perceptual remediation/compensation  OT Interventions Training and development officer, Discharge planning, Functional mobility training, DME/adaptive equipment instruction, Neuromuscular re-education, Patient/family education, Self Care/advanced ADL retraining, Therapeutic Exercise, Therapeutic Activities, UE/LE Strength taining/ROM, UE/LE Coordination activities  SLP Interventions    TR Interventions    SW/CM Interventions Discharge Planning, Psychosocial Support, Patient/Family Education   Barriers to Discharge MD  Medical stability  Nursing Medication compliance, Other (comments)    PT      OT      SLP      SW       Team Discharge Planning: Destination: PT-Home ,OT- Home , SLP-Home Projected Follow-up: PT-Outpatient PT, OT-  Home health OT, SLP-None Projected Equipment Needs: PT-To be determined, OT- None recommended by OT, SLP-None recommended by SLP Equipment Details: PT- , OT-  Patient/family involved in discharge planning: PT- Family member/caregiver,  OT-Patient, Family member/caregiver, SLP-Patient, Family member/caregiver  MD ELOS: 7-10d Medical Rehab Prognosis:  Good Assessment:  72 year old right-handed male with history of hypertension, idiopathic peripheral neuropathy with spinal cord stimulator 2016 per Dr. Melina Schools and chronic back pain, angioedema and diastolic congestive heart failure. Per chart review patient lives with spouse. Independent driving prior to admission. One level home with 6 steps to entry. Patient with recent discharge from [facility] for angioedema from ACE I.presented 04/25/2018 with left-sided weakness. While in the emergency department he developed tonic-clonic jerking of his  left arm. CT  the head showed increase conspicuityof multiple small cortical infarctions in the right posterior occipital and parietal lobes. CT angiogram of head and neck showed atherosclerotic changes without significant stenosis. MRI not completed due to nerve stimulator. EEG negative for seizure. Echocardiogram with ejection fraction of 91% and systolic function was normal. Venous Dopplers lower extremities negative for DVT. TEE completed 04/27/2018 with ejection fraction 60%. No thrombus or PFO. Negative bubble study. Underwent loop recorder placement. Latest follow-up cranial CT scan 04/27/2018 showing unchanged appearance of multiple areas of acute disc subacute ischemia in the right parietal and occipital lobes. No acute hemorrhage. Currently maintained on aspirin and Plavix for CVA prophylaxis x3 months then Plavix alone. Subcutaneous heparin for DVT prophylaxis. Patient with intermittent bouts of delirium and confusion that have improved. Maintain on a regular diet. Physical and occupational therapy evaluations completed with recommendations of physical medicine rehab consult. Patient was admitted for a comprehensive rehab program.   Now requiring 24/7 Rehab RN,MD, as well as CIR level PT, OT and SLP.  Treatment team will focus on ADLs and mobility with goals set at Mod I  See Team Conference Notes for weekly updates to the plan of care

## 2018-04-29 NOTE — Progress Notes (Signed)
Social Work  Social Work Assessment and Plan  Patient Details  Name: William Jordan MRN: [id number] Date of Birth: 09-Feb-1946  Today's Date: 04/29/2018  Problem List:  Patient Active Problem List   Diagnosis Date Noted  . Right middle cerebral artery stroke ([place]) 04/28/2018  . Elevated troponin   . Dehydration   . 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[place])   . Benign essential HTN   . Hyponatremia   . Non-ST elevation (NSTEMI) myocardial infarction ([place])   . Ventricular tachycardia ([place])   . CVA (cerebral vascular accident) ([place]) 04/25/2018  . Opiate overdose ([place]) 12/10/2017  . Acute kidney injury ([place]) 12/10/2017  . Kidney lesion, native, right 12/10/2017  . Abuse, drug or alcohol ([place]) 10/05/2015  . PNA (pneumonia) 10/05/2015  . Bacteremia due to Staphylococcus aureus 10/05/2015  . Chronic pain syndrome 10/02/2015  . Acute respiratory failure with hypoxia ([place])   . Aspiration pneumonia ([place])   . Hematemesis   . Respiratory failure ([place]) 09/26/2015  . Encephalopathy, unspecified   . Chronic back pain   . Muscle spasm of back   . Altered mental status 08/14/2015  . AKI (acute kidney injury) ([place]) 08/14/2015  . Anemia 08/14/2015  . Altered mental state 08/14/2015  . Pain in left wrist   . Gout attack 06/18/2015  . Septic joint of left wrist ([place]) 06/16/2015  . History of hypertension 06/16/2015  . Leukocytosis 06/16/2015  . Septic joint ([place]) 06/16/2015  . Acute right ankle pain 06/10/2015  . HCAP (healthcare-associated pneumonia) 06/09/2015  . Hypokalemia 06/09/2015  . Acute renal failure syndrome ([place])   . Acute encephalopathy   . Staphylococcus aureus bacteremia with sepsis ([place]) 06/07/2015  . Constipation 06/06/2015  . Toxic metabolic encephalopathy 28/41/3244  . Hyperkalemia 06/05/2015  . Low back pain with sciatica 04/03/2015  . H/O total hip arthroplasty 04/03/2015  . Idiopathic peripheral neuropathy 09/29/2014  . Chronic pain 09/28/2014  . DDD (degenerative disc disease),  lumbar 12/30/2012  . Degenerative arthritis of thoracic spine 12/30/2012  . Localized osteoarthrosis 12/13/2012  . Chronic pain associated with significant psychosocial dysfunction 12/13/2012  . Polypharmacy 12/13/2012  . Long term current use of opiate analgesic 12/13/2012   Past Medical History:  Past Medical History:  Diagnosis Date  . Angioedema    from Benazepril reaction  . Anxiety    takes Celexa  . Arthritis   . Chronic back pain   . Enlarged prostate   . History of blood transfusion   . Hypertension   . Neuropathy    Past Surgical History:  Past Surgical History:  Procedure Laterality Date  . COLONOSCOPY     one polyp removed - benign  . ELBOW SURGERY Right   . HERNIA REPAIR     umbilical  . JOINT REPLACEMENT Left    knee, x 2  . JOINT REPLACEMENT Right    hip  . JOINT REPLACEMENT Left    partial shoulder  . LOOP RECORDER INSERTION N/A 04/27/2018   Procedure: LOOP RECORDER INSERTION;  Surgeon: Evans Lance, MD;  Location: [facility] [place] [facility];  Service: Cardiovascular;  Laterality: N/A;  . SPINAL CORD STIMULATOR INSERTION N/A 09/28/2014   Procedure: LUMBAR SPINAL CORD STIMULATOR INSERTION;  Surgeon: Melina Schools, MD;  Location: [place];  Service: Orthopedics;  Laterality: N/A;  . TEE WITHOUT CARDIOVERSION N/A 04/27/2018   Procedure: TRANSESOPHAGEAL ECHOCARDIOGRAM (TEE);  Surgeon: Larey Dresser, MD;  Location: [facility] ENDOSCOPY;  Service: Cardiovascular;  Laterality: N/A;  . TONSILLECTOMY    .  VASECTOMY     Social History:  reports that he has been smoking e-cigarettes and cigars. He has quit using smokeless tobacco.  His smokeless tobacco use included snuff. He reports that he drinks alcohol. He reports that he does not use drugs.  Family / Support Systems Marital Status: Married Patient Roles: Spouse, Parent, Other (Comment)(retiree) Spouse/Significant Other: Sherry [phone]  [phone] Children: Grown children Other Supports: Friends and church  members Anticipated Caregiver: wife Ability/Limitations of Caregiver: healthy except wrist issue Caregiver Availability: 24/7 Family Dynamics: Close knit with family and friends, has always been able to take care of himself and not needed assist. Wife reports pt is very stubborn and doesn't ask for assist from anyone  Social History Preferred language: English Religion: None Cultural Background: No issues Education: High School Read: Yes Write: Yes Employment Status: Retired Freight forwarder Issues: No issues Guardian/Conservator: none-according to MD pt is capable of making his own decisions while here   Abuse/Neglect Abuse/Neglect Assessment Can Be Completed: Yes Physical Abuse: Denies Verbal Abuse: Denies Sexual Abuse: Denies Exploitation of patient/patient's resources: Denies Self-Neglect: Denies  Emotional Status Pt's affect, behavior adn adjustment status: Pt is motivated to do well and recover he feels he is ready to go home now, but his wife felt he needed a jump start and to be able to get stronger before going home. He is in agreement reluctantly. He plans to be independent at home Recent Psychosocial Issues: other health issues multiple medical issues many hospitalizations recently Pyschiatric History: History of anxiety takes meds for this and finds them helpful. He seems to be coping appropriately and doing well with his recovery. Will probably be a short length of stay here. Substance Abuse History: uses e-cigarettes aware of the health risks and is unsure if will quit. Both pt and wife are aware and feel he should quit  Patient / Family Perceptions, Expectations & Goals Pt/Family understanding of illness & functional limitations: Pt and wife can explain his health issues and stroke. Pt feels he has recovered and will be ready to go home soon. Both talk with the MD rounding and feel their questions and concerns are being addressed. Premorbid pt/family  roles/activities: husband, father, retiree, church member, etc Anticipated changes in roles/activities/participation: resume Pt/family expectations/goals: Pt states: " I want to get out of here soon, I feel ready." Wife states: "He needs to get stronger before he goes home."  [employer]: Other (Comment)(had in past) Premorbid Home Care/DME Agencies: Other (Comment)(had in past) Transportation available at discharge: Wife Resource referrals recommended: Support group (specify)  Discharge Planning Living Arrangements: Spouse/significant other Support Systems: Spouse/significant other, Children, Friends/neighbors, Church/faith community Type of Residence: Private residence Insurance Resources: Multimedia programmer (specify)(Health Team Advantage) Financial Resources: Social Security Financial Screen Referred: No Living Expenses: Own Money Management: Patient, Spouse Does the patient have any problems obtaining your medications?: No Home Management: Both of them-pt-does the cooking Patient/Family Preliminary Plans: Return home with wife who can assist and plans to be here daily yo see pt in therapies. Both feel he is doing well and will not be here long. Will await therapy team evlauations and work on discharge needs. Social Work Anticipated Follow Up Needs: HH/OP, Support Group  Clinical Impression Very pleasant couple who have been to several hospitals in the recent month and is ready to be at home and medically stable. Wife plans to be here daily to observe in therapies and see his progress while here. Will await evaluations and work on  discharge needs.  Elease Hashimoto 04/29/2018, 10:33 AM

## 2018-04-29 NOTE — Evaluation (Signed)
Occupational Therapy Assessment and Plan  Patient Details  Name: CHRITOPHER Jordan MRN: [id number] Date of Birth: [birth year]  OT Diagnosis: disturbance of vision and muscle weakness (generalized) Rehab Potential: Rehab Potential (ACUTE ONLY): Excellent ELOS: 7-9 days   Today's Date: 04/29/2018 OT Individual Time: 1100-1205 OT Individual Time Calculation (min): 65 min     Problem List:  Patient Active Problem List   Diagnosis Date Noted  . Right middle cerebral artery stroke ([place]) 04/28/2018  . Elevated troponin   . Dehydration   . 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[place])   . Benign essential HTN   . Hyponatremia   . Non-ST elevation (NSTEMI) myocardial infarction ([place])   . Ventricular tachycardia ([place])   . CVA (cerebral vascular accident) ([place]) 04/25/2018  . Opiate overdose ([place]) 12/10/2017  . Acute kidney injury ([place]) 12/10/2017  . Kidney lesion, native, right 12/10/2017  . Abuse, drug or alcohol ([place]) 10/05/2015  . PNA (pneumonia) 10/05/2015  . Bacteremia due to Staphylococcus aureus 10/05/2015  . Chronic pain syndrome 10/02/2015  . Acute respiratory failure with hypoxia ([place])   . Aspiration pneumonia ([place])   . Hematemesis   . Respiratory failure ([place]) 09/26/2015  . Encephalopathy, unspecified   . Chronic back pain   . Muscle spasm of back   . Altered mental status 08/14/2015  . AKI (acute kidney injury) ([place]) 08/14/2015  . Anemia 08/14/2015  . Altered mental state 08/14/2015  . Pain in left wrist   . Gout attack 06/18/2015  . Septic joint of left wrist ([place]) 06/16/2015  . History of hypertension 06/16/2015  . Leukocytosis 06/16/2015  . Septic joint ([place]) 06/16/2015  . Acute right ankle pain 06/10/2015  . HCAP (healthcare-associated pneumonia) 06/09/2015  . Hypokalemia 06/09/2015  . Acute renal failure syndrome ([place])   . Acute encephalopathy   . Staphylococcus aureus bacteremia with sepsis ([place]) 06/07/2015  . Constipation 06/06/2015  . Toxic metabolic encephalopathy 76/28/3151  .  Hyperkalemia 06/05/2015  . Low back pain with sciatica 04/03/2015  . H/O total hip arthroplasty 04/03/2015  . Idiopathic peripheral neuropathy 09/29/2014  . Chronic pain 09/28/2014  . DDD (degenerative disc disease), lumbar 12/30/2012  . Degenerative arthritis of thoracic spine 12/30/2012  . Localized osteoarthrosis 12/13/2012  . Chronic pain associated with significant psychosocial dysfunction 12/13/2012  . Polypharmacy 12/13/2012  . Long term current use of opiate analgesic 12/13/2012    Past Medical History:  Past Medical History:  Diagnosis Date  . Angioedema    from Benazepril reaction  . Anxiety    takes Celexa  . Arthritis   . Chronic back pain   . Enlarged prostate   . History of blood transfusion   . Hypertension   . Neuropathy    Past Surgical History:  Past Surgical History:  Procedure Laterality Date  . COLONOSCOPY     one polyp removed - benign  . ELBOW SURGERY Right   . HERNIA REPAIR     umbilical  . JOINT REPLACEMENT Left    knee, x 2  . JOINT REPLACEMENT Right    hip  . JOINT REPLACEMENT Left    partial shoulder  . LOOP RECORDER INSERTION N/A 04/27/2018   Procedure: LOOP RECORDER INSERTION;  Surgeon: Evans Lance, MD;  Location: [facility];  Service: Cardiovascular;  Laterality: N/A;  . SPINAL CORD STIMULATOR INSERTION N/A 09/28/2014   Procedure: LUMBAR SPINAL CORD STIMULATOR INSERTION;  Surgeon: Melina Schools, MD;  Location: [facility];  Service: Orthopedics;  Laterality: N/A;  . TEE  WITHOUT CARDIOVERSION N/A 04/27/2018   Procedure: TRANSESOPHAGEAL ECHOCARDIOGRAM (TEE);  Surgeon: Larey Dresser, MD;  Location: [facility];  Service: Cardiovascular;  Laterality: N/A;  . TONSILLECTOMY    . VASECTOMY      Assessment & Plan Clinical Impression:  William Jordan is a 72 year old right-handed male with history of hypertension, idiopathic peripheral neuropathy with spinal cord stimulator 2016 per Dr. Melina Schools and chronic back pain, angioedema and  diastolic congestive heart failure.  Per chart review patient lives with spouse.  Independent driving prior to admission.  One level home with 6 steps to entry.  Patient with recent discharge from [facility] for angioedema from ACE I. presented 04/25/2018 with left-sided weakness.  While in the emergency department he developed tonic-clonic jerking of his left arm.  CT the head showed increase conspicuity of multiple small cortical infarctions in the right posterior occipital and parietal lobes.  CT angiogram of head and neck showed atherosclerotic changes without significant stenosis.  MRI not completed due to nerve stimulator.  EEG negative for seizure.  Echocardiogram with ejection fraction of 81% and systolic function was normal.  Venous Dopplers lower extremities negative for DVT.  TEE completed 04/27/2018 with ejection fraction 60%.  No thrombus or PFO.  Negative bubble study.  Underwent loop recorder placement.  Latest follow-up cranial CT scan 04/27/2018 showing unchanged appearance of multiple areas of acute disc subacute ischemia in the right parietal and occipital lobes.  No acute hemorrhage.  Currently maintained on aspirin and Plavix for CVA prophylaxis x3 months then Plavix alone.  Subcutaneous heparin for DVT prophylaxis.  Patient with intermittent bouts of delirium and confusion that have improved.  Maintain on a regular diet.  Physical and occupational therapy evaluations completed with recommendations of physical medicine rehab consult.  Patient was admitted for a comprehensive rehab program.   Patient transferred to CIR on 04/28/2018 .    Patient currently requires min with basic self-care skills secondary to muscle weakness, decreased attention to left and decreased standing balance.  Prior to hospitalization, patient was fully I, driving, cooking and patient will benefit from skilled intervention to increase independence with basic self-care skills prior to discharge home with care  partner.  Anticipate patient will require intermittent supervision and follow up home health.  OT - End of Session Endurance Deficit: No OT Assessment Rehab Potential (ACUTE ONLY): Excellent OT Patient demonstrates impairments in the following area(s): Balance;Perception;Vision OT Basic ADL's Functional Problem(s): Grooming;Bathing;Dressing;Toileting OT Advanced ADL's Functional Problem(s): Simple Meal Preparation OT Transfers Functional Problem(s): Toilet;Tub/Shower OT Additional Impairment(s): None OT Plan OT Intensity: Minimum of 1-2 x/day, 45 to 90 minutes OT Frequency: 5 out of 7 days OT Duration/Estimated Length of Stay: 7-9 days OT Treatment/Interventions: Balance/vestibular training;Discharge planning;Functional mobility training;DME/adaptive equipment instruction;Neuromuscular re-education;Patient/family education;Self Care/advanced ADL retraining;Therapeutic Exercise;Therapeutic Activities;UE/LE Strength taining/ROM;UE/LE Coordination activities OT Self Feeding Anticipated Outcome(s): I OT Basic Self-Care Anticipated Outcome(s): mod I dressing, supervision showering OT Toileting Anticipated Outcome(s): mod I OT Bathroom Transfers Anticipated Outcome(s): mod I to toilet, S to shower OT Recommendation Patient destination: Home Follow Up Recommendations: Home health OT Equipment Recommended: None recommended by OT   Skilled Therapeutic Intervention Pt seen for initial evaluation and ADL training with a focus on L side awareness, safety techniques with modified bathing and dressing strategies, and balance.  Education with pt and wife on OT POC, goals. Pt participated very well and is quick to respond to cues.  For ex, he demonstrate L neglect when standing to use RW.  Once it was pointed out to pt, he attended to L side with fewer cues and was even able to tie his shoes.  Pt is able to take steps with steadying A only, but used RW for increased safety in and out of the bathroom.  Pt  showered and then returned to room to dress. Overall, pt only needed steadying assist and min cues to fully attend to L side.   Pt resting in chair in room with chair alarm on and wife in room with patient.  OT Evaluation Precautions/Restrictions  Precautions Precautions: Fall  Pain  pt has chronic neuropathic pain in feet and hands  Home Living/Prior [place] expects to be discharged to:: Private residence Living Arrangements: Spouse/significant other Available Help at Discharge: Family, Available 24 hours/day Type of Home: House Home Access: Stairs to enter [employer] of Steps: 6 Entrance Stairs-Rails: Right, Can reach both, Left Home Layout: One level Bathroom Shower/Tub: Multimedia programmer: Handicapped height Additional Comments: has shower seat  Lives With: Spouse Prior Function Level of Independence: Independent with basic ADLs, Independent with homemaking with ambulation, Independent with gait  Able to Take Stairs?: Yes Driving: Yes Vocation: Retired Comments: drives, cooks ADL ADL ADL Comments: refer to functional navigator Vision Baseline Vision/History: Wears glasses Wears Glasses: At all times Patient Visual Report: No change from baseline(pt reports that his blurred vision is now improved) Vision Assessment?: Yes Eye Alignment: Within Functional Limits Ocular Range of Motion: Other (comment) Alignment/Gaze Preference: Within Defined Limits Tracking/Visual Pursuits: Requires cues, head turns, or add eye shifts to track Saccades: Within functional limits;Additional head turns occurred during testing Visual Fields: No apparent deficits Depth Perception: Overshoots Perception  Perception: Impaired Inattention/Neglect: Does not attend to left visual field;Does not attend to left side of body(inconsistent inattention) Praxis Praxis: Intact Cognition Overall Cognitive Status: Within Functional Limits for  tasks assessed Arousal/Alertness: Awake/alert Orientation Level: Person;Place;Situation Person: Oriented Place: Oriented Situation: Oriented Year: 2019 Month: August Day of Week: Correct Memory: Appears intact Immediate Memory Recall: Sock;Blue;Bed Memory Recall: Sock;Blue;Bed Memory Recall Sock: With Cue Memory Recall Blue: Without Cue Memory Recall Bed: With Cue Attention: Selective Selective Attention: Appears intact Awareness: Appears intact Problem Solving: Appears intact Safety/Judgment: Appears intact Comments: pt admits to going to bathroom by himself but states that he has learned to press call light Sensation Sensation Light Touch: Impaired by gross assessment(in feet) Hot/Cold: Appears Intact Proprioception: Impaired by gross assessment(LUE impaired) Stereognosis: Appears Intact Coordination Gross Motor Movements are Fluid and Coordinated: No Fine Motor Movements are Fluid and Coordinated: Yes(able to tie shoes,) Coordination and Movement Description: slow and delayed on L Finger Nose Finger Test: slower on the L but adequate Motor  Motor Motor: Within Functional Limits Motor - Skilled Clinical Observations: WFL in UEs, pt reports feeling weakness in RLE Mobility    steadying A with transfers and with RW for gait Trunk/Postural Assessment  Cervical Assessment Cervical Assessment: Within Functional Limits Thoracic Assessment Thoracic Assessment: Exceptions to WFL(kyphotic posture) Lumbar Assessment Lumbar Assessment: Within Functional Limits Postural Control Postural Control: Within Functional Limits(pt stands with R knee flexed due to shorter LLE)  Balance Dynamic Sitting Balance Dynamic Sitting - Level of Assistance: 5: Stand by assistance Static Standing Balance Static Standing - Level of Assistance: 5: Stand by assistance Dynamic Standing Balance Dynamic Standing - Level of Assistance: 4: Min assist Extremity/Trunk Assessment RUE Assessment RUE  Assessment: Within Functional Limits LUE Assessment LUE Assessment: Within Functional Limits Passive Range of Motion (PROM) Comments:  prior L shoulder surgery with mild limitations in shoulder flexion   See Function Navigator for Current Functional Status.   Refer to Care Plan for Long Term Goals  Recommendations for other services: None    Discharge Criteria: Patient will be discharged from OT if patient refuses treatment 3 consecutive times without medical reason, if treatment goals not met, if there is a change in medical status, if patient makes no progress towards goals or if patient is discharged from hospital.  The above assessment, treatment plan, treatment alternatives and goals were discussed and mutually agreed upon: by patient and by family  [place] 04/29/2018, 12:52 PM

## 2018-04-30 ENCOUNTER — Inpatient Hospital Stay (HOSPITAL_COMMUNITY): Payer: PPO | Admitting: Occupational Therapy

## 2018-04-30 ENCOUNTER — Inpatient Hospital Stay (HOSPITAL_COMMUNITY): Payer: PPO | Admitting: Physical Therapy

## 2018-04-30 DIAGNOSIS — I237 Postinfarction angina: Secondary | ICD-10-CM

## 2018-04-30 DIAGNOSIS — I639 Cerebral infarction, unspecified: Secondary | ICD-10-CM

## 2018-04-30 DIAGNOSIS — G8114 Spastic hemiplegia affecting left nondominant side: Secondary | ICD-10-CM

## 2018-04-30 DIAGNOSIS — F5102 Adjustment insomnia: Secondary | ICD-10-CM

## 2018-04-30 LAB — MAGNESIUM: Magnesium: 1.8 mg/dL (ref 1.7–2.4)

## 2018-04-30 LAB — TROPONIN I
Troponin I: 0.27 ng/mL (ref <0.03)
Troponin I: 0.27 ng/mL (ref <0.03)

## 2018-04-30 MED ORDER — MAGNESIUM OXIDE 400 (241.3 MG) MG PO TABS
400.0000 mg | ORAL_TABLET | Freq: Every day | ORAL | Status: DC
  Administered 2018-04-30 – 2018-05-03 (×4): 400 mg via ORAL
  Filled 2018-04-30 (×4): qty 1

## 2018-04-30 MED ORDER — ZOLPIDEM TARTRATE 5 MG PO TABS
5.0000 mg | ORAL_TABLET | Freq: Every day | ORAL | Status: DC
  Administered 2018-04-30 – 2018-05-02 (×3): 5 mg via ORAL
  Filled 2018-04-30 (×3): qty 1

## 2018-04-30 MED ORDER — HEPARIN (PORCINE) IN NACL 100-0.45 UNIT/ML-% IJ SOLN
1100.0000 [IU]/h | INTRAMUSCULAR | Status: DC
  Administered 2018-04-30 – 2018-05-01 (×2): 1150 [IU]/h via INTRAVENOUS
  Administered 2018-05-02: 1100 [IU]/h via INTRAVENOUS
  Filled 2018-04-30 (×3): qty 250

## 2018-04-30 NOTE — Progress Notes (Signed)
Patient reports chest pain has subsided.  Complains of associated stomach cramping earlier this morning and feelings of constipation for last 4-5 days.  Patient reports passing moderate amount of gas and states his pain did not feel to be gas-related.  Encouraged patient to contact nursing staff if pain returns.  Brita Romp, RN

## 2018-04-30 NOTE — Significant Event (Signed)
Sudden c/o chest pain, described as dull heavy ,aching, does not radiate. Six/ten pain scale. States feels SOB pulse ox 97. Margrett Rud PA to bedside Stat EKG given to PA. Cardiology team to follow

## 2018-04-30 NOTE — Progress Notes (Addendum)
Physical Therapy Session Note  Patient Details  Name: William Jordan MRN: [id number] Date of Birth: Jun 12, 1946  Today's Date: 04/30/2018  Short Term Goals: Week 1:  PT Short Term Goal 1 (Week 1): = LTGs due to ELOS  Skilled Therapeutic Interventions/Progress Updates:  PA verbally states to hold therapy this afternoon 2/2 medical issues. Will f/u per POC.  Therapy Documentation Precautions:  Precautions Precautions: Fall Restrictions Weight Bearing Restrictions: (P) No   General: PT Amount of Missed Time (min): 60 Minutes PT Missed Treatment Reason: MD hold (Comment)(following chest pain/issues prior to treatment session)    See Function Navigator for Current Functional Status.   Therapy/Group: Individual Therapy  [name] 04/30/2018, 2:11 PM

## 2018-04-30 NOTE — Progress Notes (Signed)
CRITICAL VALUE ALERT  Critical Value:  Troponin 0.27  Date & Time Notied:  04/30/18 @ 1400  Provider Notified: Marlowe Shores, PA  Orders Received/Actions taken: No new orders written at this time.  Cardiology already called to come and evaluate.  Brita Romp, RN

## 2018-04-30 NOTE — Progress Notes (Signed)
Subjective/Complaints:   Objective: Vital Signs: Blood pressure 126/85, pulse 72, temperature 98 F (36.7 C), temperature source Oral, resp. rate 18, height '5\' 10"'  (1.778 m), weight 82.1 kg, SpO2 100 %. No results found. Results for orders placed or performed during the hospital encounter of 04/28/18 (from the past 72 hour(s))  CBC WITH DIFFERENTIAL     Status: Abnormal   Collection Time: 04/29/18  5:14 AM  Result Value Ref Range   WBC 9.1 4.0 - 10.5 K/uL   RBC 4.39 4.22 - 5.81 MIL/uL   Hemoglobin 12.4 (L) 13.0 - 17.0 g/dL   HCT 38.5 (L) 39.0 - 52.0 %   MCV 87.7 78.0 - 100.0 fL   MCH 28.2 26.0 - 34.0 pg   MCHC 32.2 30.0 - 36.0 g/dL   RDW 14.4 11.5 - 15.5 %   Platelets 299 150 - 400 K/uL   Neutrophils Relative % 56 %   Neutro Abs 5.1 1.7 - 7.7 K/uL   Lymphocytes Relative 25 %   Lymphs Abs 2.3 0.7 - 4.0 K/uL   Monocytes Relative 12 %   Monocytes Absolute 1.1 (H) 0.1 - 1.0 K/uL   Eosinophils Relative 3 %   Eosinophils Absolute 0.3 0.0 - 0.7 K/uL   Basophils Relative 1 %   Basophils Absolute 0.1 0.0 - 0.1 K/uL   Immature Granulocytes 3 %   Abs Immature Granulocytes 0.3 (H) 0.0 - 0.1 K/uL    Comment: Performed at [facility], [address], [place], [place] [address]  Comprehensive metabolic panel     Status: Abnormal   Collection Time: 04/29/18  5:14 AM  Result Value Ref Range   Sodium 133 (L) 135 - 145 mmol/L   Potassium 3.9 3.5 - 5.1 mmol/L   Chloride 100 98 - 111 mmol/L   CO2 26 22 - 32 mmol/L   Glucose, Bld 101 (H) 70 - 99 mg/dL   BUN 16 8 - 23 mg/dL   Creatinine, Ser 0.86 0.61 - 1.24 mg/dL   Calcium 8.7 (L) 8.9 - 10.3 mg/dL   Total Protein 6.7 6.5 - 8.1 g/dL   Albumin 3.3 (L) 3.5 - 5.0 g/dL   AST 19 15 - 41 U/L   ALT 15 0 - 44 U/L   Alkaline Phosphatase 118 38 - 126 U/L   Total Bilirubin 0.5 0.3 - 1.2 mg/dL   GFR calc non Af Amer >60 >60 mL/min   GFR calc Af Amer >60 >60 mL/min    Comment: (NOTE) The eGFR has been calculated using the CKD EPI  equation. This calculation has not been validated in all clinical situations. eGFR's persistently <60 mL/min signify possible Chronic Kidney Disease.    Anion gap 7 5 - 15    Comment: Performed at [facility] [address] [place], [place] [address]     HEENT: normal Cardio: RRR and no murmur Resp: CTA B/L and unlabored GI: BS positive and NT, ND Extremity:  Pulses positive and No Edema Skin:   Intact Neuro: Alert/Oriented, Normal Sensory, Abnormal Motor 4/5 in Left delt bi tri grip HF, KE ADF, Right side 5/5, Abnormal FMC Ataxic/ dec FMC and Other visual field intact to confrontation Musc/Skel:  Other Left mid foot deformity (fracture age 18) cavus foot Gen NAD   Assessment/Plan: 1. Functional deficits secondary toright parietal occipital lobe infarctions  which require 3+ hours per day of interdisciplinary therapy in a comprehensive inpatient rehab setting. Physiatrist is providing close team supervision and 24 hour  management of active medical problems listed below. Physiatrist and rehab team continue to assess barriers to discharge/monitor patient progress toward functional and medical goals. FIM: Function - Bathing Position: Shower Body parts bathed by patient: Right arm, Left arm, Chest, Abdomen, Front perineal area, Buttocks, Right upper leg, Left upper leg, Right lower leg, Left lower leg Body parts bathed by helper: Back Assist Level: Supervision or verbal cues  Function- Upper Body Dressing/Undressing What is the patient wearing?: Pull over shirt/dress Pull over shirt/dress - Perfomed by patient: Thread/unthread right sleeve, Thread/unthread left sleeve, Put head through opening, Pull shirt over trunk Assist Level: Supervision or verbal cues Function - Lower Body Dressing/Undressing What is the patient wearing?: Underwear, Pants, Socks, Shoes Position: Sitting EOB Underwear - Performed by patient: Thread/unthread right underwear leg, Thread/unthread left  underwear leg, Pull underwear up/down Pants- Performed by patient: Thread/unthread right pants leg, Thread/unthread left pants leg, Pull pants up/down Pants- Performed by helper: Fasten/unfasten pants Socks - Performed by patient: Don/doff right sock, Don/doff left sock Shoes - Performed by patient: Don/doff right shoe, Don/doff left shoe, Fasten right, Fasten left Assist for footwear: Supervision/touching assist Assist for lower body dressing: Supervision or verbal cues, Touching or steadying assistance (Pt > 75%)     Function - Air cabin crew transfer assistive device: Grab bar, Walker Assist level to toilet: Touching or steadying assistance (Pt > 75%) Assist level from toilet: Touching or steadying assistance (Pt > 75%)  Function - Chair/bed transfer Chair/bed transfer method: Stand pivot Chair/bed transfer assist level: Touching or steadying assistance (Pt > 75%) Chair/bed transfer details: Verbal cues for precautions/safety, Manual facilitation for weight shifting  Function - Locomotion: Wheelchair Will patient use wheelchair at discharge?: No Type: Manual Max wheelchair distance: 8 Assist Level: Maximal assistance (Pt 25 - 49%) Wheel 50 feet with 2 turns activity did not occur: Safety/medical concerns(L inattention) Wheel 150 feet activity did not occur: Safety/medical concerns(L inattention) Turns around,maneuvers to table,bed, and toilet,negotiates 3% grade,maneuvers on rugs and over doorsills: No Function - Locomotion: Ambulation Assistive device: No device Max distance: 150 Assist level: Touching or steadying assistance (Pt > 75%) Assist level: Touching or steadying assistance (Pt > 75%) Assist level: Touching or steadying assistance (Pt > 75%) Assist level: Touching or steadying assistance (Pt > 75%)  Function - Comprehension Comprehension: Auditory Comprehension assist level: Follows complex conversation/direction with no assist  Function -  Expression Expression: Verbal Expression assist level: Expresses complex ideas: With no assist  Function - Social Interaction Social Interaction assist level: Interacts appropriately with others - No medications needed.  Function - Problem Solving Problem solving assist level: Solves complex problems: Recognizes & self-corrects  Function - Memory Memory assist level: Complete Independence: No helper Patient normally able to recall (first 3 days only): Current season, Location of own room, That he or she is in a hospital  Medical Problem List and Plan: 1.Left-sided weaknesssecondary to right parietal occipital lobe infarctions. Status post loop recorder CIR PT, OT, SLP 2. DVT Prophylaxis/Anticoagulation: Subcutaneous heparin. Monitor for any bleeding episodes 3. Pain Management/chronic back pain/idiopathic peripheral neuropathy with spinal cord stimulator:Neurontin 300 mg 3 times daily, oxycodone as needed 4. Mood:Rozerem8 mg nightly 5. Neuropsych: This patientiscapable of making decisions on hisown behalf. 6. Skin/Wound Care:Routine skin checks 7. Fluids/Electrolytes/Nutrition:Routine in and outs with follow-up chemistries 8.Hypertension Vitals:   04/29/18 2041 04/30/18 0527  BP: (!) 152/93 126/85  Pulse: 67 72  Resp: 20 18  Temp: 98 F (36.7 C)   SpO2: 97% 100%  Controlled  8/16 9.Diastolic congestive heart failure. Monitor for any signs of fluid overload I: 720 yesterday 10.Angioedema. Patient recently discharged from [facility] for treatment of angioedema from ACE inhibitor. Continue to monitor 11.Hyperlipidemia. Lipitor 12.  Hx hypomag, reportedly took 594m per day at home will restart at 4063mand check level 13.  Insomnia no response to trazodone will d/c and trial ambien LOS (Days) 2 A FACE TO FACE EVALUATION WAS PERFORMED  AnCharlett Blake/16/2019, 9:32 AM

## 2018-04-30 NOTE — Progress Notes (Signed)
Occupational Therapy Session Note  Patient Details  Name: William Jordan MRN: [id number] Date of Birth: Aug 02, 1946  Today's Date: 04/30/2018 OT Individual Time: 7218-2883 OT Individual Time Calculation (min): 57 min    Short Term Goals: Week 1:  OT Short Term Goal 1 (Week 1): STGs = LTGs  Skilled Therapeutic Interventions/Progress Updates:    Treatment session with focus on dynamic standing balance and Lt attention during self-care tasks.  Pt received supine in bed reporting that he did not sleep well overnight and initially attempting to refuse therapy session.  However pt sat up EOB and engaged in self-feeding of breakfast, min cues for attention as pt very verbose and both internally and externally distracted.  Ambulated to sink with min guard.  Pt completed shaving in standing with focus on attention to Lt side of face as pt shaved Rt without issue but then required increased time for thoroughness on Lt side due to inattention.  Pt tolerated standing 10-12 mins with supervision and LUE support on sink for stability.  Pt returned to bed at end of session and left supine in bed with wife present, bed alarm set, and all needs in reach.  Pt reports fatigue and he and his wife express concern that he may have "over done it" yesterday.  Discussed rehab process and 3 hrs of therapy/day.  Pt reports that he wants to do it but is worried he may do too much.    Therapy Documentation Precautions:  Precautions Precautions: Fall Restrictions Weight Bearing Restrictions: (P) No Pain:  Pt with c/o neuropathic pain, but not rated.  Reports having just received pain meds.  See Function Navigator for Current Functional Status.   Therapy/Group: Individual Therapy  Simonne Come 04/30/2018, 10:41 AM

## 2018-04-30 NOTE — Progress Notes (Signed)
Progress Note  Patient Name: William Jordan Date of Encounter: 04/30/2018  Primary Cardiologist: No primary care provider on file.   Subjective   Asked to reevaluate William Jordan after an episode of chest discomfort at rest that was quite intense.  The symptoms oscillated in severity over period of about 10 to 15 minutes but eventually resolved spontaneously.  Chest discomfort described as dull, heavy, retrosternal without much radiation.  Interestingly, he does not recall describing exertional chest pain when I first met him 4-5 days ago.  Consequently, he cannot tell me if today's symptoms are similar to the exertional complaints.  The electrocardiogram does not show any changes.  Troponin is elevated at 0.27.  It is hard to say whether this is a new increase or just the tail end of the non-STEMI that he experienced about a week ago.  Inpatient Medications    Scheduled Meds: . aspirin  325 mg Oral Daily  . atorvastatin  40 mg Oral q1800  . clopidogrel  75 mg Oral Daily  . gabapentin  300 mg Oral TID  . heparin  5,000 Units Subcutaneous Q8H  . magnesium oxide  400 mg Oral Daily  . pantoprazole  40 mg Oral Daily  . zolpidem  5 mg Oral QHS   Continuous Infusions:  PRN Meds: acetaminophen, ondansetron **OR** ondansetron (ZOFRAN) IV, oxyCODONE, sorbitol   Vital Signs    Vitals:   04/29/18 2041 04/30/18 0527 04/30/18 1225 04/30/18 1559  BP: (!) 152/93 126/85 130/78 (!) 144/117  Pulse: 67 72 78 71  Resp: '20 18 20   ' Temp: 98 F (36.7 C)     TempSrc: Oral     SpO2: 97% 100% 97% 100%  Weight:      Height:        Intake/Output Summary (Last 24 hours) at 04/30/2018 1631 Last data filed at 04/30/2018 1253 Gross per 24 hour  Intake 840 ml  Output -  Net 840 ml   Filed Weights   04/28/18 1808  Weight: 82.1 kg    Telemetry    n/a - Personally Reviewed  ECG    Normal sinus rhythm, no acute repolarization abnormalities- Personally Reviewed  Physical Exam  Comfortable  right now lying fully supine in bed GEN: No acute distress.   Neck: No JVD Cardiac: RRR, no murmurs, rubs, or gallops.  Respiratory: Clear to auscultation bilaterally. GI: Soft, nontender, non-distended  MS: No edema; No deformity. Neuro:  Mild left hemiparesis, substantially improved since earlier in the week Psych: Normal affect   Labs    Chemistry Recent Labs  Lab 04/25/18 1243 04/25/18 1305 04/26/18 0355 04/27/18 1059 04/29/18 0514  NA 128* 127* 134*  --  133*  K 5.1 5.0 4.8  --  3.9  CL 93* 92* 95*  --  100  CO2 25  --  28  --  26  GLUCOSE 98 103* 89  --  101*  BUN 30* 36* 19  --  16  CREATININE 1.37* 1.30* 1.02 0.99 0.86  CALCIUM 8.6*  --  8.8*  --  8.7*  PROT 6.3*  --   --   --  6.7  ALBUMIN 3.5  --   --   --  3.3*  AST 21  --   --   --  19  ALT 17  --   --   --  15  ALKPHOS 74  --   --   --  118  BILITOT 0.5  --   --   --  0.5  GFRNONAA 51*  --  >60 >60 >60  GFRAA 59*  --  >60 >60 >60  ANIONGAP 10  --  11  --  7     Hematology Recent Labs  Lab 04/26/18 0355 04/27/18 1059 04/29/18 0514  WBC 16.0* 15.5* 9.1  RBC 4.01* 4.32 4.39  HGB 11.6* 12.5* 12.4*  HCT 35.7* 38.3* 38.5*  MCV 89.0 88.7 87.7  MCH 28.9 28.9 28.2  MCHC 32.5 32.6 32.2  RDW 14.5 14.4 14.4  PLT 300 324 299    Cardiac Enzymes Recent Labs  Lab 04/25/18 1854 04/26/18 0118 04/26/18 0355 04/30/18 1245  TROPONINI 2.21* 3.09* 2.83* 0.27*    Recent Labs  Lab 04/25/18 1303  TROPIPOC 0.22*     BNPNo results for input(s): BNP, PROBNP in the last 168 hours.   DDimer No results for input(s): DDIMER in the last 168 hours.   Radiology    No results found.  Cardiac Studies   Echo 04/26/2018 ------------------------------------------------------------------- LV EF: 60% - 65% Study Conclusions  - Left ventricle: The cavity size was normal. Wall thickness was increased in a pattern of mild LVH. Systolic function was normal. The estimated ejection fraction was in the range  of 60% to 65%  Patient Profile     72 y.o. male now roughly 1 week after presentation with right MCA embolic stroke, likely related to large ulcerated soft plaque in the right common carotid artery with simultaneous complaints of chest pain and elevated troponin consistent with non-STEMI (peak 3.09), transient episodes of nonsustained VT. he is having postinfarction angina.  Assessment & Plan    The initial plan was to allow him to recover from his stroke and complete rehab before we perform risk stratification for his coronary problems.  He continues to have postinfarction angina and I think he should proceed directly to coronary angiography.  We will plan to perform this on Monday, but the procedure can be performed if it becomes an emergency over the weekend, (if he has persistent chest pain and ST segment changes on the EKG). In the meantime will place him on intravenous heparin.  Continue aspirin and clopidogrel.  Note that the plaque in the distal right common carotid artery is far away from the innominate and catheterization via the right radial/subclavian approach should not be an issue.  For questions or updates, please contact [facility] Please consult [email or web address] for contact info under Cardiology/STEMI.      Signed, Sanda Klein, MD  04/30/2018, 4:31 PM

## 2018-04-30 NOTE — Progress Notes (Signed)
No longer has any chest discomfort only pain in feet. Plan of care with cardiology and current orders to continued to be followed.

## 2018-04-30 NOTE — Progress Notes (Signed)
Physical Therapy Session Note  Patient Details  Name: William Jordan MRN: [id number] Date of Birth: 04/18/1946  Today's Date: 04/30/2018 PT Individual Time: 1015-1130 PT Individual Time Calculation (min): 75 min   Short Term Goals: Week 1:  PT Short Term Goal 1 (Week 1): = LTGs due to ELOS  Skilled Therapeutic Interventions/Progress Updates:    Pt received seated in w/c in room, agreeable to PT. Pt reports neuropathic pain in B feet, not rated but reports his feet feel like they are "on fire". Per pt and RN report pt just received pain medication prior to start of therapy session. Pt is able to don shirt, shoes, and socks with setup assist and increased time, cues to attend to task as pt is easily distracted and hyperverbal. Standing balance with min A while donning shorts. Manual w/c propulsion x 50 ft with BLE and Supervision before onset of fatigue. Ambulation 2 x 90 ft with no AD and min A, some ataxia of gait. Trial of L heel lift due to leg length discrepancy, no notable difference with use of 1/8" lift. Pt's shoes do not have a removable sole and higher lift not able to be placed due to shoe style as well as neuropathic sensitivity. Ascend/descend 12 stairs with 2 handrails and min A for balance, step-through gait pattern. Side-steps with BUE support and min A, focus on glute activation. Pt fatigues quickly with standing activities and requires frequent rest breaks throughout therapy session. Pt left seated EOB in room with needs in reach, bed alarm in place, wife present.  Therapy Documentation Precautions:  Precautions Precautions: Fall Restrictions Weight Bearing Restrictions: (P) No   See Function Navigator for Current Functional Status.   Therapy/Group: Individual Therapy  Excell Seltzer, PT, DPT  04/30/2018, 12:15 PM

## 2018-04-30 NOTE — Progress Notes (Signed)
ANTICOAGULATION CONSULT NOTE - Initial Consult  Pharmacy Consult for Heparin Indication: chest pain/ACS, recent stroke (04/25/18)  Allergies  Allergen Reactions  . Ace Inhibitors Other (See Comments)    Angioedema (throat swelling)  . Morphine Other (See Comments)    Causes confusion and "makes me crazy," per the patient  . Sulfa Antibiotics Other (See Comments)    Unknown reaction    Patient Measurements: Height: 5\' 10"  (177.8 cm) Weight: 181 lb (82.1 kg) IBW/kg (Calculated) : 73 Heparin Dosing Weight: 82 kg  Vital Signs: BP: 144/117 (08/16 1559) Pulse Rate: 71 (08/16 1559)  Labs: Recent Labs    04/29/18 0514 04/30/18 1245  HGB 12.4*  --   HCT 38.5*  --   PLT 299  --   CREATININE 0.86  --   TROPONINI  --  0.27*    Estimated Creatinine Clearance: 82.5 mL/min (by C-G formula based on SCr of 0.86 mg/dL).   Medical History: Past Medical History:  Diagnosis Date  . Angioedema    from Benazepril reaction  . Anxiety    takes Celexa  . Arthritis   . Chronic back pain   . Enlarged prostate   . History of blood transfusion   . Hypertension   . Neuropathy    Assessment:  72 yr old male admitted 04/25/18 with stroke and transferred to [facility] on 04/28/18.  To begin IV heparin for ACS/NSTEMI.  Troponin 0.27 this afternoon.  Also had chest pain with elevated troponins on admit 8/11 (max troponin 3.09 on 8/12), and plan had been for ischemic workup after recovery from stroke.  Now planning cardiac cath on Monday 05/03/18.     Has been on heparin 5000 units sq q8hrs since s/p loop recorder placement 04/27/18.  Last dose ~2:30pm today.  Goal of Therapy:  Heparin level 0.3-0.5 units/ml, low end of therapeutic range, with recent stroke Monitor platelets by anticoagulation protocol: Yes   Plan:   Discontinue sq heparin.  Heparin drip to begin at 1150 units/hr.  Heparin level ~6 hrs after drip begins.  Daily heparin level and CBC while on heparin.  Arty Baumgartner,  Center Pager: [phone] 04/30/2018,5:26 PM

## 2018-05-01 ENCOUNTER — Inpatient Hospital Stay (HOSPITAL_COMMUNITY): Payer: PPO | Admitting: Occupational Therapy

## 2018-05-01 ENCOUNTER — Inpatient Hospital Stay (HOSPITAL_COMMUNITY): Payer: PPO | Admitting: Physical Therapy

## 2018-05-01 ENCOUNTER — Inpatient Hospital Stay (HOSPITAL_COMMUNITY): Payer: PPO

## 2018-05-01 DIAGNOSIS — I639 Cerebral infarction, unspecified: Secondary | ICD-10-CM

## 2018-05-01 DIAGNOSIS — G8114 Spastic hemiplegia affecting left nondominant side: Secondary | ICD-10-CM

## 2018-05-01 LAB — CBC
HCT: 38.5 % — ABNORMAL LOW (ref 39.0–52.0)
Hemoglobin: 12.3 g/dL — ABNORMAL LOW (ref 13.0–17.0)
MCH: 28.5 pg (ref 26.0–34.0)
MCHC: 31.9 g/dL (ref 30.0–36.0)
MCV: 89.1 fL (ref 78.0–100.0)
PLATELETS: 281 10*3/uL (ref 150–400)
RBC: 4.32 MIL/uL (ref 4.22–5.81)
RDW: 14.3 % (ref 11.5–15.5)
WBC: 8.5 10*3/uL (ref 4.0–10.5)

## 2018-05-01 LAB — TROPONIN I: TROPONIN I: 0.21 ng/mL — AB (ref <0.03)

## 2018-05-01 LAB — HEPARIN LEVEL (UNFRACTIONATED)
HEPARIN UNFRACTIONATED: 0.46 [IU]/mL (ref 0.30–0.70)
HEPARIN UNFRACTIONATED: 0.42 [IU]/mL (ref 0.30–0.70)

## 2018-05-01 NOTE — Progress Notes (Signed)
Subjective/Complaints:  Slept ok No CP since yesterday , appreciate cardiology consult  ROS- Denies CP, SOB, N/V/D Objective: Vital Signs: Blood pressure 116/77, pulse 65, temperature 98.6 F (37 C), temperature source Oral, resp. rate 20, height '5\' 10"'  (1.778 m), weight 80.8 kg, SpO2 95 %. No results found. Results for orders placed or performed during the hospital encounter of 04/28/18 (from the past 72 hour(s))  CBC WITH DIFFERENTIAL     Status: Abnormal   Collection Time: 04/29/18  5:14 AM  Result Value Ref Range   WBC 9.1 4.0 - 10.5 K/uL   RBC 4.39 4.22 - 5.81 MIL/uL   Hemoglobin 12.4 (L) 13.0 - 17.0 g/dL   HCT 38.5 (L) 39.0 - 52.0 %   MCV 87.7 78.0 - 100.0 fL   MCH 28.2 26.0 - 34.0 pg   MCHC 32.2 30.0 - 36.0 g/dL   RDW 14.4 11.5 - 15.5 %   Platelets 299 150 - 400 K/uL   Neutrophils Relative % 56 %   Neutro Abs 5.1 1.7 - 7.7 K/uL   Lymphocytes Relative 25 %   Lymphs Abs 2.3 0.7 - 4.0 K/uL   Monocytes Relative 12 %   Monocytes Absolute 1.1 (H) 0.1 - 1.0 K/uL   Eosinophils Relative 3 %   Eosinophils Absolute 0.3 0.0 - 0.7 K/uL   Basophils Relative 1 %   Basophils Absolute 0.1 0.0 - 0.1 K/uL   Immature Granulocytes 3 %   Abs Immature Granulocytes 0.3 (H) 0.0 - 0.1 K/uL    Comment: Performed at [facility], [address], [place], [place] [address]  Comprehensive metabolic panel     Status: Abnormal   Collection Time: 04/29/18  5:14 AM  Result Value Ref Range   Sodium 133 (L) 135 - 145 mmol/L   Potassium 3.9 3.5 - 5.1 mmol/L   Chloride 100 98 - 111 mmol/L   CO2 26 22 - 32 mmol/L   Glucose, Bld 101 (H) 70 - 99 mg/dL   BUN 16 8 - 23 mg/dL   Creatinine, Ser 0.86 0.61 - 1.24 mg/dL   Calcium 8.7 (L) 8.9 - 10.3 mg/dL   Total Protein 6.7 6.5 - 8.1 g/dL   Albumin 3.3 (L) 3.5 - 5.0 g/dL   AST 19 15 - 41 U/L   ALT 15 0 - 44 U/L   Alkaline Phosphatase 118 38 - 126 U/L   Total Bilirubin 0.5 0.3 - 1.2 mg/dL   GFR calc non Af Amer >60 >60 mL/min   GFR calc Af Amer  >60 >60 mL/min    Comment: (NOTE) The eGFR has been calculated using the CKD EPI equation. This calculation has not been validated in all clinical situations. eGFR's persistently <60 mL/min signify possible Chronic Kidney Disease.    Anion gap 7 5 - 15    Comment: Performed at [facility] [address], [place], [place] [address]  Magnesium     Status: None   Collection Time: 04/30/18  9:54 AM  Result Value Ref Range   Magnesium 1.8 1.7 - 2.4 mg/dL    Comment: Performed at [facility] [address], [place], [place] [address]  Troponin I (q 6hr x 3)     Status: Abnormal   Collection Time: 04/30/18 12:45 PM  Result Value Ref Range   Troponin I 0.27 (HH) <0.03 ng/mL    Comment: CRITICAL RESULT CALLED TO, READ BACK BY AND VERIFIED WITH: W.REARDON,RN 1400 04/30/18 CLARK,S Performed at [facility], [place] [address] [place], [place] [address]   Troponin I (q 6hr x 3)     Status: Abnormal   Collection Time: 04/30/18  6:25 PM  Result Value Ref Range   Troponin I 0.27 (HH) <0.03 ng/mL    Comment: CRITICAL VALUE NOTED.  VALUE IS CONSISTENT WITH PREVIOUSLY REPORTED AND CALLED VALUE. Performed at [facility], [place] [address] [place], [place] [address]   Troponin I (q 6hr x 3)     Status: Abnormal   Collection Time: 05/01/18  1:22 AM  Result Value Ref Range   Troponin I 0.21 (HH) <0.03 ng/mL    Comment: CRITICAL VALUE NOTED.  VALUE IS CONSISTENT WITH PREVIOUSLY REPORTED AND CALLED VALUE. Performed at [facility], [place] [address] [place], [place] [address]   Heparin level (unfractionated)     Status: None   Collection Time: 05/01/18  1:22 AM  Result Value Ref Range   Heparin Unfractionated 0.46 0.30 - 0.70 IU/mL    Comment: (NOTE) If heparin results are below expected values, and patient dosage has  been confirmed, suggest follow up testing of antithrombin III levels. Performed at [facility], [place] [address] [place], [place] [address]   CBC      Status: Abnormal   Collection Time: 05/01/18  1:22 AM  Result Value Ref Range   WBC 8.5 4.0 - 10.5 K/uL   RBC 4.32 4.22 - 5.81 MIL/uL   Hemoglobin 12.3 (L) 13.0 - 17.0 g/dL   HCT 38.5 (L) 39.0 - 52.0 %   MCV 89.1 78.0 - 100.0 fL   MCH 28.5 26.0 - 34.0 pg   MCHC 31.9 30.0 - 36.0 g/dL   RDW 14.3 11.5 - 15.5 %   Platelets 281 150 - 400 K/uL    Comment: Performed at [facility], [place] [address] [place], [place] [address]     HEENT: normal Cardio: RRR and no murmur Resp: CTA B/L and unlabored GI: BS positive and NT, ND Extremity:  Pulses positive and No Edema Skin:   Intact Neuro: Alert/Oriented, Normal Sensory, Abnormal Motor 4/5 in Left delt bi tri grip HF, KE ADF, Right side 5/5, Abnormal FMC Ataxic/ dec FMC and Other visual field intact to confrontation Musc/Skel:  Other Left mid foot deformity (fracture age 27) cavus foot Gen NAD   Assessment/Plan: 1. Functional deficits secondary to right parietal occipital lobe infarctions  which require 3+ hours per day of interdisciplinary therapy in a comprehensive inpatient rehab setting. Physiatrist is providing close team supervision and 24 hour management of active medical problems listed below. Physiatrist and rehab team continue to assess barriers to discharge/monitor patient progress toward functional and medical goals. FIM: Function - Bathing Position: Shower Body parts bathed by patient: Right arm, Left arm, Chest, Abdomen, Front perineal area, Buttocks, Right upper leg, Left upper leg, Right lower leg, Left lower leg Body parts bathed by helper: Back Assist Level: Supervision or verbal cues  Function- Upper Body Dressing/Undressing What is the patient wearing?: Pull over shirt/dress Pull over shirt/dress - Perfomed by patient: Thread/unthread right sleeve, Thread/unthread left sleeve, Put head through opening, Pull shirt over trunk Assist Level: Supervision or verbal cues Function - Lower Body  Dressing/Undressing What is the patient wearing?: Underwear, Pants, Socks, Shoes Position: Sitting EOB Underwear - Performed by patient: Thread/unthread right underwear leg, Thread/unthread left underwear leg, Pull underwear up/down Pants- Performed by patient: Thread/unthread right pants leg, Thread/unthread left pants leg, Pull pants  up/down Pants- Performed by helper: Fasten/unfasten pants Socks - Performed by patient: Don/doff right sock, Don/doff left sock Shoes - Performed by patient: Don/doff right shoe, Don/doff left shoe, Fasten right, Fasten left Assist for footwear: Supervision/touching assist Assist for lower body dressing: Supervision or verbal cues, Touching or steadying assistance (Pt > 75%)     Function - Air cabin crew transfer assistive device: Grab bar, Walker Assist level to toilet: Touching or steadying assistance (Pt > 75%) Assist level from toilet: Touching or steadying assistance (Pt > 75%)  Function - Chair/bed transfer Chair/bed transfer method: Ambulatory Chair/bed transfer assist level: Touching or steadying assistance (Pt > 75%) Chair/bed transfer assistive device: Armrests Chair/bed transfer details: Verbal cues for precautions/safety, Manual facilitation for weight shifting  Function - Locomotion: Wheelchair Will patient use wheelchair at discharge?: No Type: Manual Max wheelchair distance: 59' Assist Level: Supervision or verbal cues Wheel 50 feet with 2 turns activity did not occur: Safety/medical concerns(L inattention) Assist Level: Supervision or verbal cues Wheel 150 feet activity did not occur: Safety/medical concerns(L inattention) Turns around,maneuvers to table,bed, and toilet,negotiates 3% grade,maneuvers on rugs and over doorsills: No Function - Locomotion: Ambulation Assistive device: No device Max distance: 21' Assist level: Touching or steadying assistance (Pt > 75%) Assist level: Touching or steadying assistance (Pt >  75%) Assist level: Touching or steadying assistance (Pt > 75%) Assist level: Touching or steadying assistance (Pt > 75%)  Function - Comprehension Comprehension: Auditory Comprehension assist level: Follows complex conversation/direction with no assist  Function - Expression Expression: Verbal Expression assist level: Expresses complex ideas: With no assist  Function - Social Interaction Social Interaction assist level: Interacts appropriately with others - No medications needed.  Function - Problem Solving Problem solving assist level: Solves complex problems: Recognizes & self-corrects  Function - Memory Memory assist level: Complete Independence: No helper Patient normally able to recall (first 3 days only): Current season, Location of own room, That he or she is in a hospital  Medical Problem List and Plan: 1.Left-sided weaknesssecondary to right parietal occipital lobe infarctions. Status post loop recorder CIR PT, OT, SLP 2. DVT Prophylaxis/Anticoagulation: Subcutaneous heparin. Monitor for any bleeding episodes 3. Pain Management/chronic back pain/idiopathic peripheral neuropathy with spinal cord stimulator:Neurontin 300 mg 3 times daily, oxycodone as needed 4. Mood:Rozerem8 mg nightly 5. Neuropsych: This patientiscapable of making decisions on hisown behalf. 6. Skin/Wound Care:Routine skin checks 7. Fluids/Electrolytes/Nutrition:Routine in and outs with follow-up chemistries 8.Hypertension Vitals:   04/30/18 2001 05/01/18 0511  BP: 133/85 116/77  Pulse: 66 65  Resp: (!) 22 20  Temp: 97.9 F (36.6 C) 98.6 F (37 C)  SpO2: 97% 95%  Controlled 8/17 9.Diastolic congestive heart failure. Monitor for any signs of fluid overload I: 720 yesterday 10.Angioedema. Patient recently discharged from [facility] for treatment of angioedema from ACE inhibitor. Continue to monitor 11.Hyperlipidemia. Lipitor 12.  Hx hypomag, reportedly took 53m per day  at home will restart at 4041m Low normal  serum Mg 13.  Insomnia improved on  ambien LOS (Days) 3 A FACE TO FACE EVALUATION WAS PERFORMED  AnCharlett Blake/17/2019, 9:28 AM

## 2018-05-01 NOTE — Progress Notes (Signed)
[facility] for Heparin  Indication: chest pain/ACS, recent stroke (04/25/18)  Allergies  Allergen Reactions  . Ace Inhibitors Other (See Comments)    Angioedema (throat swelling)  . Morphine Other (See Comments)    Causes confusion and "makes me crazy," per the patient  . Sulfa Antibiotics Other (See Comments)    Unknown reaction    Patient Measurements: Height: 5\' 10"  (177.8 cm) Weight: 181 lb (82.1 kg) IBW/kg (Calculated) : 73 Heparin Dosing Weight: 82 kg  Vital Signs: Temp: 97.9 F (36.6 C) (08/16 2001) Temp Source: Oral (08/16 2001) BP: 133/85 (08/16 2001) Pulse Rate: 66 (08/16 2001)  Labs: Recent Labs    04/29/18 0514 04/30/18 1245 04/30/18 1825 05/01/18 0122  HGB 12.4*  --   --  12.3*  HCT 38.5*  --   --  38.5*  PLT 299  --   --  281  HEPARINUNFRC  --   --   --  0.46  CREATININE 0.86  --   --   --   TROPONINI  --  0.27* 0.27*  --     Estimated Creatinine Clearance: 82.5 mL/min (by C-G formula based on SCr of 0.86 mg/dL).   Medical History: Past Medical History:  Diagnosis Date  . Angioedema    from Benazepril reaction  . Anxiety    takes Celexa  . Arthritis   . Chronic back pain   . Enlarged prostate   . History of blood transfusion   . Hypertension   . Neuropathy    Assessment:  72 yr old male admitted 04/25/18 with stroke and transferred to [facility] on 04/28/18.   To begin IV heparin for ACS/NSTEMI.  Troponin 0.27 this afternoon.  Also had chest pain with elevated troponins on admit 8/11 (max troponin 3.09 on 8/12), and plan had been for ischemic workup after recovery from stroke.  Now planning cardiac cath on Monday 05/03/18.     Has been on heparin 5000 units sq q8hrs since s/p loop recorder placement 04/27/18.  Last dose ~2:30pm today.  8/17 AM update: initial heparin level is therapeutic, Hgb stable  Goal of Therapy:  Heparin level 0.3-0.5 units/ml, low end of therapeutic range, with recent stroke Monitor  platelets by anticoagulation protocol: Yes   Plan:  Cont heparin at 1150 units/hr Confirmatory heparin level at [facility], PharmD, [facility] Pharmacist Phone: [phone]

## 2018-05-01 NOTE — Progress Notes (Signed)
Physical Therapy Session Note  Patient Details  Name: William Jordan MRN: [id number] Date of Birth: 04/18/1946  Today's Date: 05/01/2018 PT Individual Time: 1300-1345 PT Individual Time Calculation (min): 45 min   Short Term Goals: Week 1:  PT Short Term Goal 1 (Week 1): = LTGs due to ELOS  Skilled Therapeutic Interventions/Progress Updates:   Pt in supine and agreeable to therapy, denies pain. Session focused on endurance and OOB tolerance. BP prior to session 134/94. Donned shoes and brushed teeth w/ supervision and increased time. Ambulated to/from day room w/ close supervision to min guard w/o AD. Performed NuStep 5 min x2 reps at level 1 for LE strengthening, BP 145/89 and pt continued to deny any pain. Prolonged rest breaks in between gait and NuStep 2/2 angina symptoms over the last 24 hours. Returned to room and ended session in supine, call bell within reach and all needs met.   Therapy Documentation Precautions:  Precautions Precautions: Fall Restrictions Weight Bearing Restrictions: No Vital Signs: Therapy Vitals BP: 135/76 Patient Position (if appropriate): Lying Pain: Pain Assessment Pain Scale: 0-10 Pain Score: 5  Pain Type: Neuropathic pain Pain Location: Foot Pain Orientation: Right;Left Pain Radiating Towards: legs, back Pain Descriptors / Indicators: Aching;Discomfort Pain Frequency: Constant Pain Onset: On-going Patients Stated Pain Goal: 3 Pain Intervention(s): Medication (See eMAR)  See Function Navigator for Current Functional Status.   Therapy/Group: Individual Therapy  Sari Cogan K Arnette 05/01/2018, 1:52 PM

## 2018-05-01 NOTE — Progress Notes (Signed)
Occupational Therapy Session Note  Patient Details  Name: William Jordan MRN: [id number] Date of Birth: [birth year]  Today's Date: 05/01/2018 OT Individual Time: 1345-1415 OT Individual Time Calculation (min): 30 min    Short Term Goals: Week 1:  OT Short Term Goal 1 (Week 1): STGs = LTGs  Skilled Therapeutic Interventions/Progress Updates:    Pt received supine in bed agreeable to therapy with no c/o pain. Session focused on functional activity tolerance and dynamic standing balance. Pt completed 100 ft of functional mobility, managing own IV pole, with close (S) and occasional vc for L visual field attention. Pt completed 3 rounds of Wii balance board training with a focus on R and L weight shifting. Tactile cues provided throughout to facilitate proper body mechanics. Pt returned to room and was left supine with all needs met and bed alarm set.    Therapy Documentation Precautions:  Precautions Precautions: Fall Restrictions Weight Bearing Restrictions: No Vital Signs: Therapy Vitals Temp: 97.6 F (36.4 C) Temp Source: Oral Pulse Rate: 83 Resp: 16 BP: (!) 116/91 Patient Position (if appropriate): Sitting Oxygen Therapy SpO2: 100 % O2 Device: Room Air Pain: Pain Assessment Pain Scale: 0-10 Pain Score: 0-No pain Pain Type: Neuropathic pain Pain Location: Foot Pain Orientation: Right;Left Pain Radiating Towards: legs, back Pain Descriptors / Indicators: Aching;Discomfort Pain Frequency: Constant Pain Onset: On-going Patients Stated Pain Goal: 3 Pain Intervention(s): Medication (See eMAR) ADL: ADL ADL Comments: refer to functional navigator  See Function Navigator for Current Functional Status.   Therapy/Group: Individual Therapy  Curtis Sites 05/01/2018, 2:31 PM

## 2018-05-01 NOTE — Progress Notes (Signed)
Progress Note  Patient Name: William Jordan Date of Encounter: 05/01/2018  Primary Cardiologist: No primary care provider on file.   V Patient Profile     72 y.o. male now roughly 1 week after presentation with right MCA embolic stroke, likely related to large ulcerated soft plaque in the right common carotid artery with simultaneous complaints of chest pain and elevated troponin consistent with non-STEMI (peak 3.09), transient episodes of nonsustained VT. he is having postinfarction angina.  Heparin started  Cath scheduled for Monday     Subjective   No recurrent chest pain   Inpatient Medications    Scheduled Meds: . aspirin  325 mg Oral Daily  . atorvastatin  40 mg Oral q1800  . clopidogrel  75 mg Oral Daily  . gabapentin  300 mg Oral TID  . magnesium oxide  400 mg Oral Daily  . pantoprazole  40 mg Oral Daily  . zolpidem  5 mg Oral QHS   Continuous Infusions: . heparin 1,150 Units/hr (05/01/18 0659)   PRN Meds: acetaminophen, ondansetron **OR** ondansetron (ZOFRAN) IV, oxyCODONE, sorbitol   Vital Signs    Vitals:   05/01/18 0942 05/01/18 1001 05/01/18 1013 05/01/18 1026  BP: 112/71 124/86 (!) 143/88 135/76  Pulse:      Resp:      Temp:      TempSrc:      SpO2:      Weight:      Height:        Intake/Output Summary (Last 24 hours) at 05/01/2018 1207 Last data filed at 05/01/2018 0744 Gross per 24 hour  Intake 2763.41 ml  Output -  Net 2763.41 ml   Filed Weights   04/28/18 1808 05/01/18 0511  Weight: 82.1 kg 80.8 kg    Telemetry    n/a - Personally Reviewed  ECG    None   Physical Exam  Well developed and nourished in no acute distress HENT normal Neck supple with JVP-flat Clear Regular rate and rhythm, no murmurs or gallops Abd-soft with active BS No Clubbing cyanosis edema Skin-warm and dry A & Oriented  Grossly normal sensory and motor function  Mild L hemiparesis   Labs    Chemistry Recent Labs  Lab 04/25/18 1243 04/25/18 1305  04/26/18 0355 04/27/18 1059 04/29/18 0514  NA 128* 127* 134*  --  133*  K 5.1 5.0 4.8  --  3.9  CL 93* 92* 95*  --  100  CO2 25  --  28  --  26  GLUCOSE 98 103* 89  --  101*  BUN 30* 36* 19  --  16  CREATININE 1.37* 1.30* 1.02 0.99 0.86  CALCIUM 8.6*  --  8.8*  --  8.7*  PROT 6.3*  --   --   --  6.7  ALBUMIN 3.5  --   --   --  3.3*  AST 21  --   --   --  19  ALT 17  --   --   --  15  ALKPHOS 74  --   --   --  118  BILITOT 0.5  --   --   --  0.5  GFRNONAA 51*  --  >60 >60 >60  GFRAA 59*  --  >60 >60 >60  ANIONGAP 10  --  11  --  7     Hematology Recent Labs  Lab 04/27/18 1059 04/29/18 0514 05/01/18 0122  WBC 15.5* 9.1 8.5  RBC 4.32 4.39  4.32  HGB 12.5* 12.4* 12.3*  HCT 38.3* 38.5* 38.5*  MCV 88.7 87.7 89.1  MCH 28.9 28.2 28.5  MCHC 32.6 32.2 31.9  RDW 14.4 14.4 14.3  PLT 324 299 281    Cardiac Enzymes Recent Labs  Lab 04/26/18 0355 04/30/18 1245 04/30/18 1825 05/01/18 0122  TROPONINI 2.83* 0.27* 0.27* 0.21*    Recent Labs  Lab 04/25/18 1303  TROPIPOC 0.22*     BNPNo results for input(s): BNP, PROBNP in the last 168 hours.   DDimer No results for input(s): DDIMER in the last 168 hours.   Radiology    No results found.  Cardiac Studies   Echo 04/26/2018 ------------------------------------------------------------------- LV EF: 60% - 65% Study Conclusions  - Left ventricle: The cavity size was normal. Wall thickness was increased in a pattern of mild LVH. Systolic function was normal. The estimated ejection fraction was in the range of 60% to 65%  Assessment & Plan    NSTEMI  Post Infarction angina  Stroke subacute thought 2/2 carotid plaque    For cath on Monday   Orders written       Signed, Virl Axe, MD  05/01/2018, 12:07 PM

## 2018-05-01 NOTE — Progress Notes (Signed)
[facility] for Heparin  Indication: chest pain/ACS, recent stroke (04/25/18)  Allergies  Allergen Reactions  . Ace Inhibitors Other (See Comments)    Angioedema (throat swelling)  . Morphine Other (See Comments)    Causes confusion and "makes me crazy," per the patient  . Sulfa Antibiotics Other (See Comments)    Unknown reaction    Patient Measurements: Height: 5\' 10"  (177.8 cm) Weight: 178 lb 2.1 oz (80.8 kg) IBW/kg (Calculated) : 73 Heparin Dosing Weight: 82 kg  Vital Signs: Temp: 98.6 F (37 C) (08/17 0511) Temp Source: Oral (08/17 0511) BP: 143/88 (08/17 1013) Pulse Rate: 65 (08/17 0511)  Labs: Recent Labs    04/29/18 0514 04/30/18 1245 04/30/18 1825 05/01/18 0122 05/01/18 0944  HGB 12.4*  --   --  12.3*  --   HCT 38.5*  --   --  38.5*  --   PLT 299  --   --  281  --   HEPARINUNFRC  --   --   --  0.46 0.42  CREATININE 0.86  --   --   --   --   TROPONINI  --  0.27* 0.27* 0.21*  --     Estimated Creatinine Clearance: 82.5 mL/min (by C-G formula based on SCr of 0.86 mg/dL).   Medical History: Past Medical History:  Diagnosis Date  . Angioedema    from Benazepril reaction  . Anxiety    takes Celexa  . Arthritis   . Chronic back pain   . Enlarged prostate   . History of blood transfusion   . Hypertension   . Neuropathy    Assessment: 71 yr old male admitted 04/25/18 with stroke and transferred to Rehab on 04/28/18 to begin IV heparin for ACS/NSTEMI. Also had chest pain with elevated troponins on admit 8/11 (max troponin 3.09 on 8/12), and plan had been for ischemic workup after recovery from stroke. Now planning to manage with heparin, ASA, Plavix over the weekend and cardiac cath on Monday 05/03/18 unless emergent need.   Initial heparin level was therapeutic and subsequent level this morning also therapeutic at 0.42. CBC stable, no reported chest pain overnight, no bleeding per RN.  Goal of Therapy:  Heparin level  0.3-0.5 units/ml (low end of therapeutic range, given recent stroke) Monitor platelets by anticoagulation protocol: Yes   Plan:  Continue heparin at 1150 units/hr Daily heparin level and CBC while on heparin   Brendolyn Patty, PharmD PGY1 Pharmacy Resident Phone [phone]  05/01/2018   10:26 AM

## 2018-05-01 NOTE — Progress Notes (Signed)
Patient denies chest pains throughout shift, no acute resp discomfort or distress, Troponin trending at 0,21,  Monitor and assisted.

## 2018-05-01 NOTE — Progress Notes (Signed)
Physical Therapy Session Note  Patient Details  Name: William Jordan MRN: [id number] Date of Birth: 06/27/1946  Today's Date: 05/01/2018 PT Individual Time: 0758-0900 PT Individual Time Calculation (min): 62 min   Short Term Goals: Week 1:  PT Short Term Goal 1 (Week 1): = LTGs due to ELOS  Skilled Therapeutic Interventions/Progress Updates:    Per nurse, Marzetta Board, pt is now off medical hold.  Session initiated with pt sitting on edge of bed finishing breakfast.  States that he feels good but reports overall stiffness.  Nursing notified of pt rating 4/10 pain and desire for meds.  Initial BP:  151/95.  Sp02: 100%; HR: 84 bpm; Pt donned shoes sitting edge of bed with verbal cues due to unthreading one shoe by accident.  Pt then ambulated to gym with CGA x 100 ft without device.  BP following ambulation: 164/93.  Session then focused on improving balance during functional mobility and pt stood on foam with EO/EC, toe taps to cones, and horse shoes (pt also picked up horse shoes).  To promote improved endurance and check vital sign response to activity, pt ambulated 100 ft x 2 with CGA and was returned to bed with 3 rails up, alarm set, and call bell in reach.  Intermittent BP: 140/103; 127/108; In supine following session:  116/89.  MD notified of pt inappropriate diastolic response to physical activity, however, reported to PT that response is okay.  Therapy Documentation Precautions:  Precautions Precautions: Fall Restrictions Weight Bearing Restrictions: (P) No    See Function Navigator for Current Functional Status.   Therapy/Group: Individual Therapy  Tramane Gorum Hilario Quarry 05/01/2018, 9:35 AM

## 2018-05-01 NOTE — Progress Notes (Signed)
Occupational Therapy Session Note  Patient Details  Name: William Jordan MRN: [id number] Date of Birth: [birth year]  Today's Date: 05/01/2018 OT Individual Time: 0930-1030 OT Individual Time Calculation (min): 60 min    Short Term Goals: Week 1:  OT Short Term Goal 1 (Week 1): STGs = LTGs  Skilled Therapeutic Interventions/Progress Updates:    Treatment session with focus on activity tolerance, Lt attention, and functional use of LUE.  Vitals assessed throughout, see flowsheets.  Pt extremely verbose throughout session, requiring cues to redirect in order to attend to task at hand.  Pt donned shoes seated EOB with setup assist.  Pt ambulated to [facility] gym with min guard while maneuvering IV pole in Rt hand.  Pt bumped in to computers on Lt due to Lt inattention and despite cues to visually scan to Lt during ambulation.  Engaged in [place] in standing with focus on Lt attention and use of LUE during reaching.  Pt alternating use of Rt and Lt UE with noted decreased reaction time with lights on Lt due to Lt inattention as well as LUE weakness.  Min guard during standing activity with intermittent weight shifts but no LOB.  Pt required frequent rest breaks due to fatigue and reports of pain in legs/feet.  Returned to room and back to bed per pt request.  Pt left in supine, notified RN of pain in legs/feet and pt requesting pain meds.  BP: 124/86 after ambulation, 143/88 after Dynavision in standing  Therapy Documentation Precautions:  Precautions Precautions: Fall Restrictions Weight Bearing Restrictions: (P) No General:   Vital Signs: Therapy Vitals BP: (!) 143/88 Patient Position (if appropriate): Sitting(after Dynavision) Pain: Pain Assessment Pain Score: 0-No pain ADL: ADL ADL Comments: refer to functional navigator Vision   Perception    Praxis   Exercises:   Other Treatments:    See Function Navigator for Current Functional Status.   Therapy/Group: Individual  Therapy  Simonne Come 05/01/2018, 10:22 AM

## 2018-05-02 LAB — HEPARIN LEVEL (UNFRACTIONATED): Heparin Unfractionated: 0.52 IU/mL (ref 0.30–0.70)

## 2018-05-02 LAB — CBC
HCT: 39.3 % (ref 39.0–52.0)
HEMOGLOBIN: 12.5 g/dL — AB (ref 13.0–17.0)
MCH: 28.5 pg (ref 26.0–34.0)
MCHC: 31.8 g/dL (ref 30.0–36.0)
MCV: 89.5 fL (ref 78.0–100.0)
PLATELETS: 267 10*3/uL (ref 150–400)
RBC: 4.39 MIL/uL (ref 4.22–5.81)
RDW: 14.1 % (ref 11.5–15.5)
WBC: 8 10*3/uL (ref 4.0–10.5)

## 2018-05-02 MED ORDER — DICLOFENAC SODIUM 1 % TD GEL
2.0000 g | Freq: Four times a day (QID) | TRANSDERMAL | Status: DC
  Administered 2018-05-02 – 2018-05-03 (×5): 2 g via TOPICAL
  Filled 2018-05-02: qty 100

## 2018-05-02 NOTE — Progress Notes (Signed)
Subjective/Complaints:  Appreciate cardiology f/u C/o arthritis pain in hands , used to take po ibuprofen ROS- Denies CP, SOB, N/V/D Objective: Vital Signs: Blood pressure (!) 161/94, pulse 92, temperature 98.2 F (36.8 C), temperature source Oral, resp. rate 19, height 5\' 10"  (1.778 m), weight 80.8 kg, SpO2 (!) 77 %. No results found. Results for orders placed or performed during the hospital encounter of 04/28/18 (from the past 72 hour(s))  Magnesium     Status: None   Collection Time: 04/30/18  9:54 AM  Result Value Ref Range   Magnesium 1.8 1.7 - 2.4 mg/dL    Comment: Performed at [facility], [place] [address], [place], [place] [address]  Troponin I (q 6hr x 3)     Status: Abnormal   Collection Time: 04/30/18 12:45 PM  Result Value Ref Range   Troponin I 0.27 (HH) <0.03 ng/mL    Comment: CRITICAL RESULT CALLED TO, READ BACK BY AND VERIFIED WITH: W.REARDON,RN 1400 04/30/18 CLARK,S Performed at [facility], [place] [address], [place], [place] [address]   Troponin I (q 6hr x 3)     Status: Abnormal   Collection Time: 04/30/18  6:25 PM  Result Value Ref Range   Troponin I 0.27 (HH) <0.03 ng/mL    Comment: CRITICAL VALUE NOTED.  VALUE IS CONSISTENT WITH PREVIOUSLY REPORTED AND CALLED VALUE. Performed at [facility], [place] [address], [place], [place] [address]   Troponin I (q 6hr x 3)     Status: Abnormal   Collection Time: 05/01/18  1:22 AM  Result Value Ref Range   Troponin I 0.21 (HH) <0.03 ng/mL    Comment: CRITICAL VALUE NOTED.  VALUE IS CONSISTENT WITH PREVIOUSLY REPORTED AND CALLED VALUE. Performed at [facility], [place] [address] [place], [place] [address]   Heparin level (unfractionated)     Status: None   Collection Time: 05/01/18  1:22 AM  Result Value Ref Range   Heparin Unfractionated 0.46 0.30 - 0.70 IU/mL    Comment: (NOTE) If heparin results are below expected values, and patient dosage has  been confirmed, suggest follow up testing of  antithrombin III levels. Performed at [facility], [place] [address] [place], [place] [address]   CBC     Status: Abnormal   Collection Time: 05/01/18  1:22 AM  Result Value Ref Range   WBC 8.5 4.0 - 10.5 K/uL   RBC 4.32 4.22 - 5.81 MIL/uL   Hemoglobin 12.3 (L) 13.0 - 17.0 g/dL   HCT 38.5 (L) 39.0 - 52.0 %   MCV 89.1 78.0 - 100.0 fL   MCH 28.5 26.0 - 34.0 pg   MCHC 31.9 30.0 - 36.0 g/dL   RDW 14.3 11.5 - 15.5 %   Platelets 281 150 - 400 K/uL    Comment: Performed at [facility], [place] [address] [place], [place] [address]  Heparin level (unfractionated)     Status: None   Collection Time: 05/01/18  9:44 AM  Result Value Ref Range   Heparin Unfractionated 0.42 0.30 - 0.70 IU/mL    Comment: (NOTE) If heparin results are below expected values, and patient dosage has  been confirmed, suggest follow up testing of antithrombin III levels. Performed at [facility], [place] [address], [place], [place] [address]   CBC     Status: Abnormal   Collection Time: 05/02/18  5:15 AM  Result Value Ref Range   WBC 8.0 4.0 -  10.5 K/uL   RBC 4.39 4.22 - 5.81 MIL/uL   Hemoglobin 12.5 (L) 13.0 - 17.0 g/dL   HCT 39.3 39.0 - 52.0 %   MCV 89.5 78.0 - 100.0 fL   MCH 28.5 26.0 - 34.0 pg   MCHC 31.8 30.0 - 36.0 g/dL   RDW 14.1 11.5 - 15.5 %   Platelets 267 150 - 400 K/uL    Comment: Performed at [facility], [place] [address], [place], [place] [address]  Heparin level (unfractionated)     Status: None   Collection Time: 05/02/18  5:15 AM  Result Value Ref Range   Heparin Unfractionated 0.52 0.30 - 0.70 IU/mL    Comment: (NOTE) If heparin results are below expected values, and patient dosage has  been confirmed, suggest follow up testing of antithrombin III levels. Performed at [facility], [place] [address] [place], [place] [address]      HEENT: normal Cardio: RRR and no murmur Resp: CTA B/L and unlabored GI: BS positive and NT, ND Extremity:  Pulses positive and No  Edema Skin:   Intact Neuro: Alert/Oriented, Normal Sensory, Abnormal Motor 4/5 in Left delt bi tri grip HF, KE ADF, Right side 5/5, Abnormal FMC Ataxic/ dec FMC and Other visual field intact to confrontation Musc/Skel:  Other Left mid foot deformity (fracture age 71) cavus foot, Heberden's nodules, DIP deformities Gen NAD   Assessment/Plan: 1. Functional deficits secondary to right parietal occipital lobe infarctions  which require 3+ hours per day of interdisciplinary therapy in a comprehensive inpatient rehab setting. Physiatrist is providing close team supervision and 24 hour management of active medical problems listed below. Physiatrist and rehab team continue to assess barriers to discharge/monitor patient progress toward functional and medical goals. FIM: Function - Bathing Position: Shower Body parts bathed by patient: Right arm, Left arm, Chest, Abdomen, Front perineal area, Buttocks, Right upper leg, Left upper leg, Right lower leg, Left lower leg Body parts bathed by helper: Back Assist Level: Supervision or verbal cues  Function- Upper Body Dressing/Undressing What is the patient wearing?: Pull over shirt/dress Pull over shirt/dress - Perfomed by patient: Thread/unthread right sleeve, Thread/unthread left sleeve, Put head through opening, Pull shirt over trunk Assist Level: Supervision or verbal cues Function - Lower Body Dressing/Undressing What is the patient wearing?: Underwear, Pants, Socks, Shoes Position: Sitting EOB Underwear - Performed by patient: Thread/unthread right underwear leg, Thread/unthread left underwear leg, Pull underwear up/down Pants- Performed by patient: Thread/unthread right pants leg, Thread/unthread left pants leg, Pull pants up/down Pants- Performed by helper: Fasten/unfasten pants Socks - Performed by patient: Don/doff right sock, Don/doff left sock Shoes - Performed by patient: Don/doff right shoe, Don/doff left shoe, Fasten right, Fasten  left Assist for footwear: Supervision/touching assist Assist for lower body dressing: Supervision or verbal cues, Touching or steadying assistance (Pt > 75%)     Function - Air cabin crew transfer assistive device: Grab bar, Walker Assist level to toilet: Touching or steadying assistance (Pt > 75%) Assist level from toilet: Touching or steadying assistance (Pt > 75%)  Function - Chair/bed transfer Chair/bed transfer method: Ambulatory Chair/bed transfer assist level: Touching or steadying assistance (Pt > 75%) Chair/bed transfer assistive device: Armrests Chair/bed transfer details: Verbal cues for precautions/safety, Manual facilitation for weight shifting  Function - Locomotion: Wheelchair Will patient use wheelchair at discharge?: No Type: Manual Max wheelchair distance: 70' Assist Level: Supervision or verbal cues Wheel 50 feet with 2 turns activity did not occur: Safety/medical concerns(L inattention)  Assist Level: Supervision or verbal cues Wheel 150 feet activity did not occur: Safety/medical concerns(L inattention) Turns around,maneuvers to table,bed, and toilet,negotiates 3% grade,maneuvers on rugs and over doorsills: No Function - Locomotion: Ambulation Assistive device: No device Max distance: 100' Assist level: Touching or steadying assistance (Pt > 75%) Assist level: Touching or steadying assistance (Pt > 75%) Assist level: Touching or steadying assistance (Pt > 75%) Assist level: Touching or steadying assistance (Pt > 75%)  Function - Comprehension Comprehension: Auditory Comprehension assist level: Follows complex conversation/direction with no assist  Function - Expression Expression: Verbal Expression assist level: Expresses complex ideas: With no assist  Function - Social Interaction Social Interaction assist level: Interacts appropriately with others - No medications needed.  Function - Problem Solving Problem solving assist level: Solves  complex problems: Recognizes & self-corrects  Function - Memory Memory assist level: Complete Independence: No helper Patient normally able to recall (first 3 days only): Current season, Location of own room, That he or she is in a hospital  Medical Problem List and Plan: 1.Left-sided weaknesssecondary to right parietal occipital lobe infarctions. Status post loop recorder CIR PT, OT, SLP 2. DVT Prophylaxis/Anticoagulation: Subcutaneous heparin. Monitor for any bleeding episodes 3. Pain Management/chronic back pain/idiopathic peripheral neuropathy with spinal cord stimulator:Neurontin 300 mg 3 times daily, oxycodone as needed Also has arthritis pain in hands- not good candidate for NSAIDs due to CVA and CAD, may use diclofenac gel 4. Mood:support from staff, neuropsych may be of assist given chronic pain issues 5. Neuropsych: This patientiscapable of making decisions on hisown behalf. 6. Skin/Wound Care:Routine skin checks 7. Fluids/Electrolytes/Nutrition:Routine in and outs with follow-up chemistries 8.Hypertension Vitals:   05/01/18 2039 05/02/18 0702  BP: (!) 145/83 (!) 161/94  Pulse: 70 92  Resp: 18 19  Temp: 98.5 F (36.9 C) 98.2 F (36.8 C)  SpO2: 98% (!) 77%  elevated 8/18, BPs fluctuating 9.Diastolic congestive heart failure. Monitor for any signs of fluid overload I: 720 yesterday 10.Angioedema. Patient recently discharged from [facility] for treatment of angioedema from ACE inhibitor. Continue to monitor 11.Hyperlipidemia. Lipitor 12.  Hx hypomag, reportedly took 500mg  per day at home will restart at 400mg   Low normal  serum Mg 13.  Insomnia improved on  ambien LOS (Days) 4 A FACE TO FACE EVALUATION WAS PERFORMED  Charlett Blake 05/02/2018, 9:50 AM

## 2018-05-02 NOTE — Progress Notes (Signed)
[place] for Heparin  Indication: chest pain/ACS, recent stroke (04/25/18)  Allergies  Allergen Reactions  . Ace Inhibitors Other (See Comments)    Angioedema (throat swelling)  . Morphine Other (See Comments)    Causes confusion and "makes me crazy," per the patient  . Sulfa Antibiotics Other (See Comments)    Unknown reaction    Patient Measurements: Height: 5\' 10"  (177.8 cm) Weight: 178 lb 2.1 oz (80.8 kg) IBW/kg (Calculated) : 73 Heparin Dosing Weight: 82 kg  Vital Signs: Temp: 98.2 F (36.8 C) (08/18 0702) Temp Source: Oral (08/18 0702) BP: 161/94 (08/18 0702) Pulse Rate: 92 (08/18 0702)  Labs: Recent Labs    04/30/18 1245 04/30/18 1825 05/01/18 0122 05/01/18 0944 05/02/18 0515  HGB  --   --  12.3*  --  12.5*  HCT  --   --  38.5*  --  39.3  PLT  --   --  281  --  267  HEPARINUNFRC  --   --  0.46 0.42 0.52  TROPONINI 0.27* 0.27* 0.21*  --   --     Estimated Creatinine Clearance: 82.5 mL/min (by C-G formula based on SCr of 0.86 mg/dL).   Medical History: Past Medical History:  Diagnosis Date  . Angioedema    from Benazepril reaction  . Anxiety    takes Celexa  . Arthritis   . Chronic back pain   . Enlarged prostate   . History of blood transfusion   . Hypertension   . Neuropathy    Assessment: 72 yr old male admitted 04/25/18 with stroke and transferred to Rehab on 04/28/18 to begin IV heparin for ACS/NSTEMI. Also had chest pain with elevated troponins on admit 8/11 (max troponin 3.09 on 8/12), and plan had been for ischemic workup after recovery from stroke. Now planning to manage with heparin, ASA, Plavix over the weekend and cardiac cath on Monday 05/03/18 unless emergent need.   Heparin level this morning slightly supratherapeutic at 0.52. CBC stable, no reported chest pain overnight, no reported bleeding or issues with the infusion per RN. As heparin level this morning is just barely outside goal range, will  slightly decrease rate of infusion and recheck heparin level with daily labs tomorrow morning.  Goal of Therapy:  Heparin level 0.3-0.5 units/ml (low end of therapeutic range, given recent stroke) Monitor platelets by anticoagulation protocol: Yes   Plan:  Decrease heparin rate to 1100 units/hr Daily heparin level and CBC while on heparin   Brendolyn Patty, PharmD PGY1 Pharmacy Resident Phone [phone]  05/02/2018   8:05 AM

## 2018-05-02 NOTE — Progress Notes (Signed)
Progress Note  Patient Name: William Jordan Date of Encounter: 05/02/2018  Primary Cardiologist: No primary care provider on file.   V Patient Profile     72 y.o. male now roughly 1 week after presentation with right MCA embolic stroke, likely related to large ulcerated soft plaque in the right common carotid artery with simultaneous complaints of chest pain and elevated troponin consistent with non-STEMI (peak 3.09), transient episodes of nonsustained VT. he is having postinfarction angina.  Heparin started  Cath scheduled for Monday     Subjective   Without chest pain of shortness of breath   Inpatient Medications    Scheduled Meds: . aspirin  325 mg Oral Daily  . atorvastatin  40 mg Oral q1800  . clopidogrel  75 mg Oral Daily  . diclofenac sodium  2 g Topical QID  . gabapentin  300 mg Oral TID  . magnesium oxide  400 mg Oral Daily  . pantoprazole  40 mg Oral Daily  . zolpidem  5 mg Oral QHS   Continuous Infusions: . heparin 1,100 Units/hr (05/02/18 0927)   PRN Meds: acetaminophen, ondansetron **OR** ondansetron (ZOFRAN) IV, oxyCODONE, sorbitol   Vital Signs    Vitals:   05/01/18 1026 05/01/18 1419 05/01/18 2039 05/02/18 0702  BP: 135/76 (!) 116/91 (!) 145/83 (!) 161/94  Pulse:  83 70 92  Resp:  16 18 19   Temp:  97.6 F (36.4 C) 98.5 F (36.9 C) 98.2 F (36.8 C)  TempSrc:  Oral Oral Oral  SpO2:  100% 98% (!) 77%  Weight:      Height:        Intake/Output Summary (Last 24 hours) at 05/02/2018 1349 Last data filed at 05/02/2018 1200 Gross per 24 hour  Intake 1296.03 ml  Output 360 ml  Net 936.03 ml   Filed Weights   04/28/18 1808 05/01/18 0511  Weight: 82.1 kg 80.8 kg    Telemetry    n/a - Personally Reviewed  ECG    None   Physical Exam  Well developed and nourished in no acute distress HENT normal Neck supple with JVP-flat Clear Regular rate and rhythm, no murmurs or gallops Abd-soft with active BS No Clubbing cyanosis edema Skin-warm  and dry A & Oriented     Labs    Chemistry Recent Labs  Lab 04/26/18 0355 04/27/18 1059 04/29/18 0514  NA 134*  --  133*  K 4.8  --  3.9  CL 95*  --  100  CO2 28  --  26  GLUCOSE 89  --  101*  BUN 19  --  16  CREATININE 1.02 0.99 0.86  CALCIUM 8.8*  --  8.7*  PROT  --   --  6.7  ALBUMIN  --   --  3.3*  AST  --   --  19  ALT  --   --  15  ALKPHOS  --   --  118  BILITOT  --   --  0.5  GFRNONAA >60 >60 >60  GFRAA >60 >60 >60  ANIONGAP 11  --  7     Hematology Recent Labs  Lab 04/29/18 0514 05/01/18 0122 05/02/18 0515  WBC 9.1 8.5 8.0  RBC 4.39 4.32 4.39  HGB 12.4* 12.3* 12.5*  HCT 38.5* 38.5* 39.3  MCV 87.7 89.1 89.5  MCH 28.2 28.5 28.5  MCHC 32.2 31.9 31.8  RDW 14.4 14.3 14.1  PLT 299 281 267    Cardiac Enzymes Recent Labs  Lab 04/26/18 0355 04/30/18 1245 04/30/18 1825 05/01/18 0122  TROPONINI 2.83* 0.27* 0.27* 0.21*    No results for input(s): TROPIPOC in the last 168 hours.   BNPNo results for input(s): BNP, PROBNP in the last 168 hours.   DDimer No results for input(s): DDIMER in the last 168 hours.   Radiology    No results found.  Cardiac Studies   Echo 04/26/2018 ------------------------------------------------------------------- LV EF: 60% - 65% Study Conclusions  - Left ventricle: The cavity size was normal. Wall thickness was increased in a pattern of mild LVH. Systolic function was normal. The estimated ejection fraction was in the range of 60% to 65%  Assessment & Plan    NSTEMI  Post Infarction angina  Stroke subacute thought 2/2 carotid plaque    For cath on Monday   BP reasonably controlled   qustions answered      Signed, Virl Axe, MD  05/02/2018, 1:49 PM

## 2018-05-03 ENCOUNTER — Observation Stay (HOSPITAL_COMMUNITY)
Admission: AD | Admit: 2018-05-03 | Discharge: 2018-05-04 | Disposition: A | Discharge status: Home / Self Care | Payer: PPO | Source: Ambulatory Visit | Attending: Cardiology | Admitting: Cardiology

## 2018-05-03 ENCOUNTER — Inpatient Hospital Stay (HOSPITAL_COMMUNITY): Payer: PPO

## 2018-05-03 ENCOUNTER — Ambulatory Visit (HOSPITAL_COMMUNITY): Admission: RE | Admit: 2018-05-03 | Payer: PPO | Source: Ambulatory Visit | Admitting: Cardiology

## 2018-05-03 ENCOUNTER — Inpatient Hospital Stay (HOSPITAL_COMMUNITY): Payer: PPO | Admitting: Physical Therapy

## 2018-05-03 ENCOUNTER — Encounter (HOSPITAL_COMMUNITY): Payer: Self-pay | Admitting: Cardiology

## 2018-05-03 ENCOUNTER — Encounter (HOSPITAL_COMMUNITY)
Admission: RE | Disposition: A | Discharge status: Home / Self Care | Payer: Self-pay | Source: Intra-hospital | Attending: Physical Medicine & Rehabilitation

## 2018-05-03 ENCOUNTER — Other Ambulatory Visit: Payer: Self-pay

## 2018-05-03 DIAGNOSIS — Z885 Allergy status to narcotic agent status: Secondary | ICD-10-CM | POA: Diagnosis not present

## 2018-05-03 DIAGNOSIS — I110 Hypertensive heart disease with heart failure: Secondary | ICD-10-CM | POA: Diagnosis not present

## 2018-05-03 DIAGNOSIS — Z7902 Long term (current) use of antithrombotics/antiplatelets: Secondary | ICD-10-CM | POA: Insufficient documentation

## 2018-05-03 DIAGNOSIS — X58XXXA Exposure to other specified factors, initial encounter: Secondary | ICD-10-CM | POA: Insufficient documentation

## 2018-05-03 DIAGNOSIS — Z9889 Other specified postprocedural states: Secondary | ICD-10-CM | POA: Insufficient documentation

## 2018-05-03 DIAGNOSIS — I5032 Chronic diastolic (congestive) heart failure: Secondary | ICD-10-CM | POA: Insufficient documentation

## 2018-05-03 DIAGNOSIS — I25119 Atherosclerotic heart disease of native coronary artery with unspecified angina pectoris: Secondary | ICD-10-CM | POA: Diagnosis not present

## 2018-05-03 DIAGNOSIS — Z96612 Presence of left artificial shoulder joint: Secondary | ICD-10-CM | POA: Insufficient documentation

## 2018-05-03 DIAGNOSIS — Z9852 Vasectomy status: Secondary | ICD-10-CM | POA: Diagnosis not present

## 2018-05-03 DIAGNOSIS — M1990 Unspecified osteoarthritis, unspecified site: Secondary | ICD-10-CM | POA: Diagnosis not present

## 2018-05-03 DIAGNOSIS — Z8249 Family history of ischemic heart disease and other diseases of the circulatory system: Secondary | ICD-10-CM | POA: Insufficient documentation

## 2018-05-03 DIAGNOSIS — R778 Other specified abnormalities of plasma proteins: Secondary | ICD-10-CM | POA: Diagnosis present

## 2018-05-03 DIAGNOSIS — G629 Polyneuropathy, unspecified: Secondary | ICD-10-CM | POA: Diagnosis not present

## 2018-05-03 DIAGNOSIS — I472 Ventricular tachycardia, unspecified: Secondary | ICD-10-CM

## 2018-05-03 DIAGNOSIS — N400 Enlarged prostate without lower urinary tract symptoms: Secondary | ICD-10-CM | POA: Diagnosis not present

## 2018-05-03 DIAGNOSIS — Z96641 Presence of right artificial hip joint: Secondary | ICD-10-CM | POA: Diagnosis not present

## 2018-05-03 DIAGNOSIS — I69354 Hemiplegia and hemiparesis following cerebral infarction affecting left non-dominant side: Secondary | ICD-10-CM | POA: Diagnosis not present

## 2018-05-03 DIAGNOSIS — F419 Anxiety disorder, unspecified: Secondary | ICD-10-CM | POA: Diagnosis not present

## 2018-05-03 DIAGNOSIS — Z882 Allergy status to sulfonamides status: Secondary | ICD-10-CM | POA: Insufficient documentation

## 2018-05-03 DIAGNOSIS — Z79899 Other long term (current) drug therapy: Secondary | ICD-10-CM | POA: Insufficient documentation

## 2018-05-03 DIAGNOSIS — F17290 Nicotine dependence, other tobacco product, uncomplicated: Secondary | ICD-10-CM | POA: Insufficient documentation

## 2018-05-03 DIAGNOSIS — Z7982 Long term (current) use of aspirin: Secondary | ICD-10-CM | POA: Diagnosis not present

## 2018-05-03 DIAGNOSIS — T783XXA Angioneurotic edema, initial encounter: Secondary | ICD-10-CM | POA: Diagnosis not present

## 2018-05-03 DIAGNOSIS — Z955 Presence of coronary angioplasty implant and graft: Secondary | ICD-10-CM | POA: Diagnosis not present

## 2018-05-03 DIAGNOSIS — R079 Chest pain, unspecified: Secondary | ICD-10-CM | POA: Diagnosis present

## 2018-05-03 DIAGNOSIS — Z888 Allergy status to other drugs, medicaments and biological substances status: Secondary | ICD-10-CM | POA: Insufficient documentation

## 2018-05-03 DIAGNOSIS — Z96652 Presence of left artificial knee joint: Secondary | ICD-10-CM | POA: Insufficient documentation

## 2018-05-03 DIAGNOSIS — R7989 Other specified abnormal findings of blood chemistry: Secondary | ICD-10-CM

## 2018-05-03 HISTORY — PX: CARDIAC CATHETERIZATION: SHX172

## 2018-05-03 HISTORY — PX: LEFT HEART CATH AND CORONARY ANGIOGRAPHY: CATH118249

## 2018-05-03 HISTORY — DX: Cerebral infarction, unspecified: I63.9

## 2018-05-03 LAB — CBC
HEMATOCRIT: 37.3 % — AB (ref 39.0–52.0)
Hemoglobin: 12.1 g/dL — ABNORMAL LOW (ref 13.0–17.0)
MCH: 28.9 pg (ref 26.0–34.0)
MCHC: 32.4 g/dL (ref 30.0–36.0)
MCV: 89 fL (ref 78.0–100.0)
Platelets: 244 10*3/uL (ref 150–400)
RBC: 4.19 MIL/uL — ABNORMAL LOW (ref 4.22–5.81)
RDW: 14.3 % (ref 11.5–15.5)
WBC: 6.6 10*3/uL (ref 4.0–10.5)

## 2018-05-03 LAB — HEPARIN LEVEL (UNFRACTIONATED): Heparin Unfractionated: 0.43 IU/mL (ref 0.30–0.70)

## 2018-05-03 SURGERY — LEFT HEART CATH AND CORONARY ANGIOGRAPHY
Anesthesia: LOCAL

## 2018-05-03 MED ORDER — ALFUZOSIN HCL ER 10 MG PO TB24: 10.0000 mg | ORAL_TABLET | Freq: Every day | ORAL | Status: DC

## 2018-05-03 MED ORDER — IOPAMIDOL (ISOVUE-370) INJECTION 76%
INTRAVENOUS | Status: DC | PRN
  Administered 2018-05-03: 85 mL via INTRA_ARTERIAL

## 2018-05-03 MED ORDER — ACETAMINOPHEN 325 MG PO TABS
650.0000 mg | ORAL_TABLET | ORAL | Status: DC | PRN
  Administered 2018-05-04: 650 mg via ORAL
  Filled 2018-05-03: qty 2

## 2018-05-03 MED ORDER — HEPARIN (PORCINE) IN NACL 1000-0.9 UT/500ML-% IV SOLN
INTRAVENOUS | Status: DC | PRN
  Administered 2018-05-03: 500 mL

## 2018-05-03 MED ORDER — HEPARIN (PORCINE) IN NACL 100-0.45 UNIT/ML-% IJ SOLN
INTRAMUSCULAR | Status: AC
  Administered 2018-05-03: 10:00:00
  Filled 2018-05-03: qty 250

## 2018-05-03 MED ORDER — SODIUM CHLORIDE 0.9% FLUSH: 3.0000 mL | INTRAVENOUS | Status: DC | PRN

## 2018-05-03 MED ORDER — LIDOCAINE HCL (PF) 1 % IJ SOLN
INTRAMUSCULAR | Status: AC
  Filled 2018-05-03: qty 30

## 2018-05-03 MED ORDER — IOPAMIDOL (ISOVUE-370) INJECTION 76%
INTRAVENOUS | Status: AC
  Filled 2018-05-03: qty 100

## 2018-05-03 MED ORDER — ONDANSETRON HCL 4 MG/2ML IJ SOLN: 4.0000 mg | Freq: Four times a day (QID) | INTRAMUSCULAR | Status: DC | PRN

## 2018-05-03 MED ORDER — VERAPAMIL HCL 2.5 MG/ML IV SOLN
INTRAVENOUS | Status: AC
  Filled 2018-05-03: qty 2

## 2018-05-03 MED ORDER — SODIUM CHLORIDE 0.9 % IV SOLN: 250.0000 mL | INTRAVENOUS | Status: DC | PRN

## 2018-05-03 MED ORDER — FENTANYL CITRATE (PF) 100 MCG/2ML IJ SOLN
INTRAMUSCULAR | Status: AC
  Filled 2018-05-03: qty 2

## 2018-05-03 MED ORDER — SODIUM CHLORIDE 0.9% FLUSH: 3.0000 mL | Freq: Two times a day (BID) | INTRAVENOUS | Status: DC

## 2018-05-03 MED ORDER — ENOXAPARIN SODIUM 40 MG/0.4ML ~~LOC~~ SOLN: 40.0000 mg | SUBCUTANEOUS | Status: DC

## 2018-05-03 MED ORDER — LIDOCAINE HCL (PF) 1 % IJ SOLN
INTRAMUSCULAR | Status: DC | PRN
  Administered 2018-05-03: 2 mL

## 2018-05-03 MED ORDER — MIDAZOLAM HCL 2 MG/2ML IJ SOLN
INTRAMUSCULAR | Status: DC | PRN
  Administered 2018-05-03: 1 mg via INTRAVENOUS

## 2018-05-03 MED ORDER — ASPIRIN 81 MG PO CHEW
81.0000 mg | CHEWABLE_TABLET | Freq: Every day | ORAL | Status: DC
  Administered 2018-05-03 – 2018-05-04 (×2): 81 mg via ORAL
  Filled 2018-05-03 (×2): qty 1

## 2018-05-03 MED ORDER — HEPARIN (PORCINE) IN NACL 1000-0.9 UT/500ML-% IV SOLN
INTRAVENOUS | Status: AC
  Filled 2018-05-03: qty 1000

## 2018-05-03 MED ORDER — SODIUM CHLORIDE 0.9 % WEIGHT BASED INFUSION: 3.0000 mL/kg/h | INTRAVENOUS | Status: DC

## 2018-05-03 MED ORDER — FENTANYL CITRATE (PF) 100 MCG/2ML IJ SOLN
INTRAMUSCULAR | Status: DC | PRN
  Administered 2018-05-03: 25 ug via INTRAVENOUS

## 2018-05-03 MED ORDER — SODIUM CHLORIDE 0.9 % WEIGHT BASED INFUSION: 1.0000 mL/kg/h | INTRAVENOUS | Status: DC

## 2018-05-03 MED ORDER — VERAPAMIL HCL 2.5 MG/ML IV SOLN
INTRAVENOUS | Status: DC | PRN
  Administered 2018-05-03: 14:00:00 via INTRA_ARTERIAL

## 2018-05-03 MED ORDER — HEPARIN SODIUM (PORCINE) 1000 UNIT/ML IJ SOLN
INTRAMUSCULAR | Status: DC | PRN
  Administered 2018-05-03: 4000 [IU] via INTRAVENOUS

## 2018-05-03 MED ORDER — ALPRAZOLAM 0.5 MG PO TABS
1.0000 mg | ORAL_TABLET | Freq: Once | ORAL | Status: AC
  Administered 2018-05-03: 1 mg via ORAL
  Filled 2018-05-03: qty 2

## 2018-05-03 MED ORDER — HEPARIN SODIUM (PORCINE) 1000 UNIT/ML IJ SOLN
INTRAMUSCULAR | Status: AC
  Filled 2018-05-03: qty 1

## 2018-05-03 MED ORDER — SODIUM CHLORIDE 0.9 % IV SOLN: INTRAVENOUS | Status: AC

## 2018-05-03 MED ORDER — MIDAZOLAM HCL 2 MG/2ML IJ SOLN
INTRAMUSCULAR | Status: AC
  Filled 2018-05-03: qty 2

## 2018-05-03 SURGICAL SUPPLY — 9 items
CATH 5FR JL3.5 JR4 ANG PIG MP (CATHETERS) ×2 IMPLANT
DEVICE RAD COMP TR BAND LRG (VASCULAR PRODUCTS) ×2 IMPLANT
GLIDESHEATH SLEND SS 6F .021 (SHEATH) ×2 IMPLANT
GUIDEWIRE INQWIRE 1.5J.035X260 (WIRE) ×1 IMPLANT
INQWIRE 1.5J .035X260CM (WIRE) ×2
KIT HEART LEFT (KITS) ×2 IMPLANT
PACK CARDIAC CATHETERIZATION (CUSTOM PROCEDURE TRAY) ×2 IMPLANT
TRANSDUCER W/STOPCOCK (MISCELLANEOUS) ×2 IMPLANT
TUBING CIL FLEX 10 FLL-RA (TUBING) ×2 IMPLANT

## 2018-05-03 NOTE — Progress Notes (Signed)
Progress Note  Patient Name: William Jordan Date of Encounter: 05/03/2018  Primary Cardiologist: No primary care provider on file.   Subjective   No chest pain or dyspnea. Anxious about procedure today.  Inpatient Medications    Scheduled Meds: . [START ON 05/04/2018] alfuzosin  10 mg Oral Q breakfast  . aspirin  325 mg Oral Daily  . atorvastatin  40 mg Oral q1800  . clopidogrel  75 mg Oral Daily  . diclofenac sodium  2 g Topical QID  . gabapentin  300 mg Oral TID  . magnesium oxide  400 mg Oral Daily  . pantoprazole  40 mg Oral Daily  . zolpidem  5 mg Oral QHS   Continuous Infusions: . heparin 1,100 Units/hr (05/02/18 1412)   PRN Meds: acetaminophen, ondansetron **OR** ondansetron (ZOFRAN) IV, oxyCODONE, sorbitol   Vital Signs    Vitals:   05/03/18 1016 05/03/18 1017 05/03/18 1020 05/03/18 1025  BP: (!) 142/127 (!) 128/99 116/85 (!) 139/121  Pulse:      Resp:      Temp:      TempSrc:      SpO2:      Weight:      Height:        Intake/Output Summary (Last 24 hours) at 05/03/2018 1035 Last data filed at 05/03/2018 0800 Gross per 24 hour  Intake 1140.72 ml  Output -  Net 1140.72 ml   Filed Weights   04/28/18 1808  Weight: 82.1 kg    Telemetry    NSR - Personally Reviewed  ECG    None today - Personally Reviewed  Physical Exam   GEN: No acute distress.   Neck: No JVD Cardiac: RRR, no murmurs, rubs, or gallops.  Respiratory: Clear to auscultation bilaterally. GI: Soft, nontender, non-distended  MS: No edema; No deformity. Neuro:  very mild left hemiparesis. Psych: Normal affect   Labs    Chemistry Recent Labs  Lab 04/27/18 1059 04/29/18 0514  NA  --  133*  K  --  3.9  CL  --  100  CO2  --  26  GLUCOSE  --  101*  BUN  --  16  CREATININE 0.99 0.86  CALCIUM  --  8.7*  PROT  --  6.7  ALBUMIN  --  3.3*  AST  --  19  ALT  --  15  ALKPHOS  --  118  BILITOT  --  0.5  GFRNONAA >60 >60  GFRAA >60 >60  ANIONGAP  --  7      Hematology Recent Labs  Lab 05/01/18 0122 05/02/18 0515 05/03/18 0648  WBC 8.5 8.0 6.6  RBC 4.32 4.39 4.19*  HGB 12.3* 12.5* 12.1*  HCT 38.5* 39.3 37.3*  MCV 89.1 89.5 89.0  MCH 28.5 28.5 28.9  MCHC 31.9 31.8 32.4  RDW 14.3 14.1 14.3  PLT 281 267 244    Cardiac Enzymes Recent Labs  Lab 04/30/18 1245 04/30/18 1825 05/01/18 0122  TROPONINI 0.27* 0.27* 0.21*   No results for input(s): TROPIPOC in the last 168 hours.   BNPNo results for input(s): BNP, PROBNP in the last 168 hours.   DDimer No results for input(s): DDIMER in the last 168 hours.   Radiology    No results found.  Cardiac Studies   Echo 04/26/2018 ------------------------------------------------------------------- LV EF: 60% - 65% Study Conclusions  - Left ventricle: The cavity size was normal. Wall thickness was increased in a pattern of mild LVH. Systolic function was normal.  The estimated ejection fraction was in the range of 60% to 65%  Patient Profile     72 y.o. male 1 week after presentation with right MCA embolic stroke, likely related to large ulcerated soft plaque in the right common carotid artery with simultaneous complaints of chest pain and elevated troponin consistent with non-STEMI (peak 3.09), transient episodes of nonsustained VT. he is having postinfarction angina.  Assessment & Plan    1. NSTEMI. ? Whether ACS or demand ischemia in setting of recent acute CVA. Patient did experience chest pain and has mild elevation of troponins. Ecg nonacute. Plan for cardiac cath today. He is on DAPT. Should be fine for radial approach.  2. Recent right hemispheric CVA. Improving. Ulcerative soft plaque in right CCA. Medical therapy.      For questions or updates, please contact [place] Please consult [email or web address] for contact info under Cardiology/STEMI.      Signed, Amana Bouska Martinique, MD  05/03/2018, 10:35 AM

## 2018-05-03 NOTE — Progress Notes (Signed)
Inpatient Rehabilitation Admissions Coordinator  Patient readmitted to acute post cath from inpatient rehab. Inpt rehab admit 04/28/2018. Please reorder PT and OT when appropriate, for I will have to get authorization with [employer] for pt to be readmitted to rehab when appropriate. I will follow up tomorrow.  Danne Baxter, RN, MSN Rehab Admissions Coordinator [phone] 05/03/2018 5:07 PM

## 2018-05-03 NOTE — Discharge Summary (Signed)
NAME: William Jordan, William Jordan MEDICAL RECORD TD:[id number] ACCOUNT [id number] DATE OF BIRTH:1946-04-12 FACILITY: [facility] LOCATION: [facility] PHYSICIAN:ANDREW Letta Pate, MD  DISCHARGE SUMMARY  DATE OF DISCHARGE:  05/03/2018  ADMIT DATE:  04/29/2018  DATE OF DISCHARGE:  05/03/2018   DISCHARGE DIAGNOSES: 1.  Right parietal occipital lobe infarction.   2.  Subcutaneous heparin for deep venous thrombosis prophylaxis. 3.  Chronic pain management. 4.  Hypertension.   5.  Diastolic congestive heart failure. 6.  Angioedema. 7.  Insomnia. 8.  Angina post non-ST-elevation myocardial infarction.  HOSPITAL COURSE:  This is a 72 year old right-handed male with history of hypertension, spinal cord stimulator 2016, idiopathic peripheral neuropathy, angioedema and diastolic congestive heart failure.  Lives with spouse.  Independent prior to admission.   Presented with recent discharge from [facility] for angioedema from ACE inhibitor 04/25/2018 with left-sided weakness.  While in the emergent of department developed tonic-clonic jerking of the left arm.  CT of the head showed increased conspicuity of  multiple small cortical infarctions in the right posterior occipital and parietal lobes.  CT angiogram of head and neck showed atherosclerotic changes without significant stenosis.  MRI not completed due to nerve stimulator.  EEG negative for seizure.   Echocardiogram with ejection fraction of 65%.  Systolic function normal.  Venous Doppler studies negative.  TEE completed ejection fraction of 60%, no thrombus or PFO.  Negative bubble study.  A later followup cranial CT scan showed unchanged appearance  of multiple areas of acute disk subacute ischemia right parietal occipital lobes.  Maintained on aspirin and Plavix therapy.  Subcutaneous heparin for DVT prophylaxis.  The patient was admitted for comprehensive rehabilitation program.  PAST MEDICAL HISTORY:  See discharge diagnoses.  SOCIAL HISTORY:  Lives with  spouse, independent prior to admission.  FUNCTIONAL STATUS:  Upon admission to rehab services was max assist 16 feet 2 person handheld assistance, moderate assist sit to stand, min mod assist with activities of daily living.  PHYSICAL EXAMINATION: VITAL SIGNS:  Blood pressure 171/108, pulse 70, temperature 97, respirations 18. GENERAL:  Alert male.  Speech was a bit dysarthric followed full commands. HEENT:  EOMs intact. NECK:  Supple, nontender, no JVD. CARDIOVASCULAR:  Rate controlled. ABDOMEN:  Soft, nontender, good bowel sounds. LUNGS:  Clear to auscultation without wheeze.  REHABILITATION HOSPITAL COURSE:  The patient was admitted to inpatient rehabilitation services.  Therapies initiated on a 3-hour daily basis, consisting of physical therapy, occupational therapy and rehabilitation nursing.  The following issues were  addressed during patient's rehabilitation stay.  Pertaining to the patient's right parietal occipital lobe infarction, remained stable.  Aspirin and Plavix therapy.  He had undergone loop recorder followed by neurology services.  Chronic back pain,  peripheral neuropathy, spinal cord stimulator.  Neurontin as advised.  Not a candidate for nonsteroidals due to CVA.  Blood pressure is monitored as well as monitoring for any signs of fluid overload.  Noted recent discharge from [facility] for  treatment of angioedema from ACE inhibitor.  Noted cardiology followup for angina.  Workup NSTEMI.   Cardiac catheterization was indicated.  Placed on intravenous heparin.  The patient was discharged to acute care services 05/03/2018 for cardiac  catheterization, ongoing medication changes will be made as per cardiology services.  TN/NUANCE D:05/03/2018 T:05/03/2018 JOB:[id number]

## 2018-05-03 NOTE — Progress Notes (Addendum)
Physical Therapy Session Note  Patient Details  Name: William Jordan MRN: [id number] Date of Birth: 10/13/1945  Today's Date: 05/03/2018 PT Individual Time: 1005-1020 PT Individual Time Calculation (min): 15 min   Short Term Goals: Week 1:  PT Short Term Goal 1 (Week 1): = LTGs due to ELOS  Skilled Therapeutic Interventions/Progress Updates:   Pt received in bed reporting chronic neuropathy pain and anxiety over impending cardiac cath later today. Melanie [employer] banker, corporate) cleared pt for participation in therapy & RN notified of pt's anxiety. Pt's BP assessed in supine, in RUE, & as follows:  BP = 142/153mmHg Rechecked after pt attempted self relaxation techniques: 128/99 mmHg After additional relaxation techniques: 116/85 mmHg  Pt transferred to EOB & discussed home set up information & pt reports he primarily uses front entrance with 2 steps to enter with R rail versus back entrance with 5-6 steps to enter. BP in RUE sitting EOB = 139/121 mmHg. Educated pt on holding therapy session per elevated BP. Pt requested to brush teeth and did so at bed level with set up assist. Offered to play music to help pt relax but pt declined. Pt left in bed with alarm set & needs within reach. PA Linna Hoff) & RN aware of pt's elevated BP.    Therapy Documentation Precautions:  Precautions Precautions: Fall Restrictions Weight Bearing Restrictions: No   General: PT Amount of Missed Time (min): 45 Minutes PT Missed Treatment Reason: MD hold (Comment)(elevated BP)   See Function Navigator for Current Functional Status.   Therapy/Group: Individual Therapy  [name] 05/03/2018, 10:45 AM

## 2018-05-03 NOTE — Progress Notes (Signed)
Physical Therapy Note  Patient Details  Name: William Jordan MRN: [id number] Date of Birth: 1946/04/19 Today's Date: 05/03/2018    Pt off unit for heart cath at time of scheduled PT session. Pt missed 30 minutes of scheduled PT treatment. Will f/u per POC.   [name] 05/03/2018, 1:41 PM

## 2018-05-03 NOTE — Progress Notes (Signed)
Prep for Cardiac Cath, second IV started left AC with NS at 10 ml/hr, wrist bamds moved to left arm. Groin and right radial area clipped. Video of CCTV #115 viewed. Obtained one time dose to be given at 1230 of Xanax 1 mg

## 2018-05-03 NOTE — Progress Notes (Signed)
Occupational Therapy Session Note  Patient Details  Name: William Jordan MRN: [id number] Date of Birth: [birth year]  Today's Date: 05/03/2018 OT Individual Time: 5329-9242 OT Individual Time Calculation (min): 57 min    Short Term Goals: Week 1:  OT Short Term Goal 1 (Week 1): STGs = LTGs  Skilled Therapeutic Interventions/Progress Updates:    Pt sitting up EOB eating breakfast upon arrival. Pt with questions re heparin drip disconnection to get shower. Discussion with RN who clarified pt can not be disconnected at this time. RN also delivered pain medication to pt prior to start of session.  Pt with questions re heart cath scheduled this afternoon. Pt completed functional mobility to bathroom with (S), managing own IV pole. Pt completed 3/3 toileting tasks with (S). Pt stood at sink and completed oral care and UB bathing/dressing with intermittent UE support on sink and (S) level. Pt completed LB bathing sit <>stand from EOB with (S). Vc provided for safety awareness to reduce fall risk. Pt able to retrieve items off the floor and move throughout the room to put items away with (S). Pt very verbose this session requiring vc for redirection to task several times. Pt demo great improvement in activity tolerance this session and progress toward OT goals. Pt left EOB with all needs met and bed alarm set.   Therapy Documentation Precautions:  Precautions Precautions: Fall Restrictions Weight Bearing Restrictions: No   Vital Signs: Therapy Vitals Temp: 98.3 F (36.8 C) Pulse Rate: 64 Resp: 20 BP: (!) 157/97 Patient Position (if appropriate): Sitting Oxygen Therapy SpO2: 96 % O2 Device: Room Air Pain: Pain Assessment Pain Scale: 0-10 Pain Score: 0-No pain ADL: ADL ADL Comments: refer to functional navigator  See Function Navigator for Current Functional Status.   Therapy/Group: Individual Therapy  Curtis Sites 05/03/2018, 8:27 AM

## 2018-05-03 NOTE — Progress Notes (Signed)
To cardiac cath, preop given. Family with pt then return to room.

## 2018-05-03 NOTE — Progress Notes (Signed)
TR BAND REMOVAL  LOCATION:    right radial  DEFLATED PER PROTOCOL:    Yes.    TIME BAND OFF / DRESSING APPLIED:    2058   SITE UPON ARRIVAL:    Level 0  SITE AFTER BAND REMOVAL:    Level 0  CIRCULATION SENSATION AND MOVEMENT:    Within Normal Limits   Yes.    COMMENTS:   Post TRband instructions given, good cap refill

## 2018-05-03 NOTE — Progress Notes (Signed)
Occupational Therapy Note  Patient Details  Name: AZAIAH LICCIARDI MRN: [id number] Date of Birth: 1946-03-14  Today's Date: 05/03/2018 OT Missed Time: 68 Minutes Missed Time Reason: Unavailable (comment)(pt at heart cath)  Checked on pt at 1330. Pt off unit for heart cath procedure. OT will continue to f/u.    Curtis Sites 05/03/2018, 1:38 PM

## 2018-05-03 NOTE — H&P (View-Only) (Signed)
Progress Note  Patient Name: William Jordan Date of Encounter: 05/03/2018  Primary Cardiologist: No primary care provider on file.   Subjective   No chest pain or dyspnea. Anxious about procedure today.  Inpatient Medications    Scheduled Meds: . [START ON 05/04/2018] alfuzosin  10 mg Oral Q breakfast  . aspirin  325 mg Oral Daily  . atorvastatin  40 mg Oral q1800  . clopidogrel  75 mg Oral Daily  . diclofenac sodium  2 g Topical QID  . gabapentin  300 mg Oral TID  . magnesium oxide  400 mg Oral Daily  . pantoprazole  40 mg Oral Daily  . zolpidem  5 mg Oral QHS   Continuous Infusions: . heparin 1,100 Units/hr (05/02/18 1412)   PRN Meds: acetaminophen, ondansetron **OR** ondansetron (ZOFRAN) IV, oxyCODONE, sorbitol   Vital Signs    Vitals:   05/03/18 1016 05/03/18 1017 05/03/18 1020 05/03/18 1025  BP: (!) 142/127 (!) 128/99 116/85 (!) 139/121  Pulse:      Resp:      Temp:      TempSrc:      SpO2:      Weight:      Height:        Intake/Output Summary (Last 24 hours) at 05/03/2018 1035 Last data filed at 05/03/2018 0800 Gross per 24 hour  Intake 1140.72 ml  Output -  Net 1140.72 ml   Filed Weights   04/28/18 1808  Weight: 82.1 kg    Telemetry    NSR - Personally Reviewed  ECG    None today - Personally Reviewed  Physical Exam   GEN: No acute distress.   Neck: No JVD Cardiac: RRR, no murmurs, rubs, or gallops.  Respiratory: Clear to auscultation bilaterally. GI: Soft, nontender, non-distended  MS: No edema; No deformity. Neuro:  very mild left hemiparesis. Psych: Normal affect   Labs    Chemistry Recent Labs  Lab 04/27/18 1059 04/29/18 0514  NA  --  133*  K  --  3.9  CL  --  100  CO2  --  26  GLUCOSE  --  101*  BUN  --  16  CREATININE 0.99 0.86  CALCIUM  --  8.7*  PROT  --  6.7  ALBUMIN  --  3.3*  AST  --  19  ALT  --  15  ALKPHOS  --  118  BILITOT  --  0.5  GFRNONAA >60 >60  GFRAA >60 >60  ANIONGAP  --  7      Hematology Recent Labs  Lab 05/01/18 0122 05/02/18 0515 05/03/18 0648  WBC 8.5 8.0 6.6  RBC 4.32 4.39 4.19*  HGB 12.3* 12.5* 12.1*  HCT 38.5* 39.3 37.3*  MCV 89.1 89.5 89.0  MCH 28.5 28.5 28.9  MCHC 31.9 31.8 32.4  RDW 14.3 14.1 14.3  PLT 281 267 244    Cardiac Enzymes Recent Labs  Lab 04/30/18 1245 04/30/18 1825 05/01/18 0122  TROPONINI 0.27* 0.27* 0.21*   No results for input(s): TROPIPOC in the last 168 hours.   BNPNo results for input(s): BNP, PROBNP in the last 168 hours.   DDimer No results for input(s): DDIMER in the last 168 hours.   Radiology    No results found.  Cardiac Studies   Echo 04/26/2018 ------------------------------------------------------------------- LV EF: 60% - 65% Study Conclusions  - Left ventricle: The cavity size was normal. Wall thickness was increased in a pattern of mild LVH. Systolic function was normal.  The estimated ejection fraction was in the range of 60% to 65%  Patient Profile     72 y.o. male 1 week after presentation with right MCA embolic stroke, likely related to large ulcerated soft plaque in the right common carotid artery with simultaneous complaints of chest pain and elevated troponin consistent with non-STEMI (peak 3.09), transient episodes of nonsustained VT. he is having postinfarction angina.  Assessment & Plan    1. NSTEMI. ? Whether ACS or demand ischemia in setting of recent acute CVA. Patient did experience chest pain and has mild elevation of troponins. Ecg nonacute. Plan for cardiac cath today. He is on DAPT. Should be fine for radial approach.  2. Recent right hemispheric CVA. Improving. Ulcerative soft plaque in right CCA. Medical therapy.      For questions or updates, please contact [place] Please consult [email or web address] for contact info under Cardiology/STEMI.      Signed, Donaven Criswell Martinique, MD  05/03/2018, 10:35 AM

## 2018-05-03 NOTE — Progress Notes (Signed)
Subjective/Complaints:  No new issues overnite, denies CP or SOB , pt will have cath later this am  ROS- Denies CP, SOB, N/V/D Objective: Vital Signs: Blood pressure (!) 157/97, pulse 64, temperature 98.3 F (36.8 C), resp. rate 20, height 5\' 10"  (1.778 m), weight 82.1 kg, SpO2 96 %. No results found. Results for orders placed or performed during the hospital encounter of 04/28/18 (from the past 72 hour(s))  Magnesium     Status: None   Collection Time: 04/30/18  9:54 AM  Result Value Ref Range   Magnesium 1.8 1.7 - 2.4 mg/dL    Comment: Performed at [facility], [place] [address], [place], [place] [address]  Troponin I (q 6hr x 3)     Status: Abnormal   Collection Time: 04/30/18 12:45 PM  Result Value Ref Range   Troponin I 0.27 (HH) <0.03 ng/mL    Comment: CRITICAL RESULT CALLED TO, READ BACK BY AND VERIFIED WITH: W.REARDON,RN 1400 04/30/18 CLARK,S Performed at [facility], [place] [address] [place], [place] [address]   Troponin I (q 6hr x 3)     Status: Abnormal   Collection Time: 04/30/18  6:25 PM  Result Value Ref Range   Troponin I 0.27 (HH) <0.03 ng/mL    Comment: CRITICAL VALUE NOTED.  VALUE IS CONSISTENT WITH PREVIOUSLY REPORTED AND CALLED VALUE. Performed at [facility], [place] [address], [place], [place] [address]   Troponin I (q 6hr x 3)     Status: Abnormal   Collection Time: 05/01/18  1:22 AM  Result Value Ref Range   Troponin I 0.21 (HH) <0.03 ng/mL    Comment: CRITICAL VALUE NOTED.  VALUE IS CONSISTENT WITH PREVIOUSLY REPORTED AND CALLED VALUE. Performed at [facility], [place] [address], [place], [place] [address]   Heparin level (unfractionated)     Status: None   Collection Time: 05/01/18  1:22 AM  Result Value Ref Range   Heparin Unfractionated 0.46 0.30 - 0.70 IU/mL    Comment: (NOTE) If heparin results are below expected values, and patient dosage has  been confirmed, suggest follow up testing of antithrombin III levels. Performed  at [facility], [place] [address] [place], [place] [address]   CBC     Status: Abnormal   Collection Time: 05/01/18  1:22 AM  Result Value Ref Range   WBC 8.5 4.0 - 10.5 K/uL   RBC 4.32 4.22 - 5.81 MIL/uL   Hemoglobin 12.3 (L) 13.0 - 17.0 g/dL   HCT 38.5 (L) 39.0 - 52.0 %   MCV 89.1 78.0 - 100.0 fL   MCH 28.5 26.0 - 34.0 pg   MCHC 31.9 30.0 - 36.0 g/dL   RDW 14.3 11.5 - 15.5 %   Platelets 281 150 - 400 K/uL    Comment: Performed at [facility], [place] [address] [place], [place] [address]  Heparin level (unfractionated)     Status: None   Collection Time: 05/01/18  9:44 AM  Result Value Ref Range   Heparin Unfractionated 0.42 0.30 - 0.70 IU/mL    Comment: (NOTE) If heparin results are below expected values, and patient dosage has  been confirmed, suggest follow up testing of antithrombin III levels. Performed at [facility], [place] [address] [place], [place] [address]   CBC     Status: Abnormal   Collection Time: 05/02/18  5:15 AM  Result Value Ref Range   WBC 8.0 4.0 - 10.5  K/uL   RBC 4.39 4.22 - 5.81 MIL/uL   Hemoglobin 12.5 (L) 13.0 - 17.0 g/dL   HCT 39.3 39.0 - 52.0 %   MCV 89.5 78.0 - 100.0 fL   MCH 28.5 26.0 - 34.0 pg   MCHC 31.8 30.0 - 36.0 g/dL   RDW 14.1 11.5 - 15.5 %   Platelets 267 150 - 400 K/uL    Comment: Performed at [facility], [place] [address] [place], [place] [address]  Heparin level (unfractionated)     Status: None   Collection Time: 05/02/18  5:15 AM  Result Value Ref Range   Heparin Unfractionated 0.52 0.30 - 0.70 IU/mL    Comment: (NOTE) If heparin results are below expected values, and patient dosage has  been confirmed, suggest follow up testing of antithrombin III levels. Performed at [facility], [place] [address], [place], [place] [address]   CBC     Status: Abnormal   Collection Time: 05/03/18  6:48 AM  Result Value Ref Range   WBC 6.6 4.0 - 10.5 K/uL   RBC 4.19 (L) 4.22 - 5.81 MIL/uL   Hemoglobin 12.1 (L)  13.0 - 17.0 g/dL   HCT 37.3 (L) 39.0 - 52.0 %   MCV 89.0 78.0 - 100.0 fL   MCH 28.9 26.0 - 34.0 pg   MCHC 32.4 30.0 - 36.0 g/dL   RDW 14.3 11.5 - 15.5 %   Platelets 244 150 - 400 K/uL    Comment: Performed at [facility], [place] [address], [place], [place] [address]  Heparin level (unfractionated)     Status: None   Collection Time: 05/03/18  6:48 AM  Result Value Ref Range   Heparin Unfractionated 0.43 0.30 - 0.70 IU/mL    Comment: (NOTE) If heparin results are below expected values, and patient dosage has  been confirmed, suggest follow up testing of antithrombin III levels. Performed at [facility], [place] [address], [place],  [address]      HEENT: normal Cardio: RRR and no murmur Resp: CTA B/L and unlabored GI: BS positive and NT, ND Extremity:  Pulses positive and No Edema Skin:   Intact Neuro: Alert/Oriented, Normal Sensory, Abnormal Motor 4/5 in Left delt bi tri grip HF, KE ADF, Right side 5/5, Abnormal FMC Ataxic/ dec FMC and Other visual field intact to confrontation Musc/Skel:  Other Left mid foot deformity (fracture age 72) cavus foot, Heberden's nodules, DIP deformities Gen NAD   Assessment/Plan: 1. Functional deficits secondary to right parietal occipital lobe infarctions  which require 3+ hours per day of interdisciplinary therapy in a comprehensive inpatient rehab setting. Physiatrist is providing close team supervision and 24 hour management of active medical problems listed below. Physiatrist and rehab team continue to assess barriers to discharge/monitor patient progress toward functional and medical goals. FIM: Function - Bathing Position: Standing at sink Body parts bathed by patient: Right arm, Left arm, Chest, Abdomen, Front perineal area, Buttocks, Right upper leg, Left upper leg, Right lower leg, Left lower leg Body parts bathed by helper: Back Assist Level: Supervision or verbal cues, Set up Set up : To obtain items  Function- Upper  Body Dressing/Undressing What is the patient wearing?: Pull over shirt/dress Pull over shirt/dress - Perfomed by patient: Thread/unthread right sleeve, Thread/unthread left sleeve, Put head through opening, Pull shirt over trunk Assist Level: Supervision or verbal cues Function - Lower Body Dressing/Undressing What is the patient wearing?: Underwear, Pants, Non-skid slipper socks Position: Sitting  EOB Underwear - Performed by patient: Thread/unthread right underwear leg, Thread/unthread left underwear leg, Pull underwear up/down Pants- Performed by patient: Thread/unthread right pants leg, Thread/unthread left pants leg, Pull pants up/down Pants- Performed by helper: Fasten/unfasten pants Non-skid slipper socks- Performed by patient: Don/doff right sock, Don/doff left sock Socks - Performed by patient: Don/doff right sock, Don/doff left sock Shoes - Performed by patient: Don/doff right shoe, Don/doff left shoe, Fasten right, Fasten left Assist for footwear: Setup Assist for lower body dressing: Supervision or verbal cues  Function - Toileting Toileting steps completed by patient: Adjust clothing prior to toileting, Performs perineal hygiene, Adjust clothing after toileting  Function - Toilet Transfers Toilet transfer assistive device: Grab bar Assist level to toilet: Supervision or verbal cues Assist level from toilet: Supervision or verbal cues  Function - Chair/bed transfer Chair/bed transfer method: Ambulatory Chair/bed transfer assist level: Supervision or verbal cues Chair/bed transfer assistive device: Armrests Chair/bed transfer details: Verbal cues for precautions/safety  Function - Locomotion: Wheelchair Will patient use wheelchair at discharge?: No Type: Manual Max wheelchair distance: 93' Assist Level: Supervision or verbal cues Wheel 50 feet with 2 turns activity did not occur: Safety/medical concerns(L inattention) Assist Level: Supervision or verbal cues Wheel 150  feet activity did not occur: Safety/medical concerns(L inattention) Turns around,maneuvers to table,bed, and toilet,negotiates 3% grade,maneuvers on rugs and over doorsills: No Function - Locomotion: Ambulation Assistive device: No device Max distance: 100' Assist level: Touching or steadying assistance (Pt > 75%) Assist level: Touching or steadying assistance (Pt > 75%) Assist level: Touching or steadying assistance (Pt > 75%) Assist level: Touching or steadying assistance (Pt > 75%)  Function - Comprehension Comprehension: Auditory Comprehension assist level: Follows complex conversation/direction with no assist  Function - Expression Expression: Verbal Expression assist level: Expresses complex ideas: With no assist  Function - Social Interaction Social Interaction assist level: Interacts appropriately with others - No medications needed.  Function - Problem Solving Problem solving assist level: Solves complex problems: Recognizes & self-corrects  Function - Memory Memory assist level: Complete Independence: No helper Patient normally able to recall (first 3 days only): Current season, Location of own room, That he or she is in a hospital  Medical Problem List and Plan: 1.Left-sided weaknesssecondary to right parietal occipital lobe infarctions. Status post loop recorder CIR PT, OT, SLP 2. DVT Prophylaxis/Anticoagulation: Subcutaneous heparin. Monitor for any bleeding episodes 3. Pain Management/chronic back pain/idiopathic peripheral neuropathy with spinal cord stimulator:Neurontin 300 mg 3 times daily, oxycodone as needed Also has arthritis pain in hands- not good candidate for NSAIDs due to CVA and CAD, may use diclofenac gel 4. Mood:support from staff, neuropsych may be of assist given chronic pain issues 5. Neuropsych: This patientiscapable of making decisions on hisown behalf. 6. Skin/Wound Care:Routine skin checks 7. Fluids/Electrolytes/Nutrition:Routine in  and outs with follow-up chemistries 8.Hypertension Vitals:   05/02/18 1958 05/03/18 0554  BP: (!) 143/93 (!) 157/97  Pulse: 77 64  Resp:  20  Temp:  98.3 F (36.8 C)  SpO2: 95% 96%  elevated BPs fluctuating- will defer meds to cardiology 9.Diastolic congestive heart failure. Monitor for any signs of fluid overload I: 720 yesterday 10.Angioedema. Patient recently discharged from [facility] for treatment of angioedema from ACE inhibitor. Continue to monitor 11.Hyperlipidemia. Lipitor 12.  Hx hypomag, reportedly took 500mg  per day at home will restart at 400mg   Low normal  serum Mg 13.  Insomnia improved on  ambien 14.  Angina post NSTEMI- cardiac cath today, on IV heparin per  cardiology LOS (Days) 5 A FACE TO FACE EVALUATION WAS PERFORMED  Charlett Blake 05/03/2018, 8:59 AM

## 2018-05-03 NOTE — Progress Notes (Signed)
Have contacted [facility] [employer] regarding staying on acute to be monitored and probable transfer back to CIR once medically cleared. Have spoke with [facility] to continue to follow on acute.

## 2018-05-03 NOTE — Progress Notes (Signed)
Thanks, Peter MCr 

## 2018-05-03 NOTE — Interval H&P Note (Signed)
History and Physical Interval Note:  05/03/2018 4:51 PM  William Jordan  has presented today for surgery, with the diagnosis of chest pain  The various methods of treatment have been discussed with the patient and family. After consideration of risks, benefits and other options for treatment, the patient has consented to  Procedure(s): LEFT HEART CATH AND CORONARY ANGIOGRAPHY (N/A) as a surgical intervention .  The patient's history has been reviewed, patient examined, no change in status, stable for surgery.  I have reviewed the patient's chart and labs.  Questions were answered to the patient's satisfaction.     Collier Salina [facility] 05/03/2018

## 2018-05-04 ENCOUNTER — Inpatient Hospital Stay (HOSPITAL_REHABILITATION)
Admission: RE | Admit: 2018-05-04 | Discharge: 2018-05-07 | Disposition: A | Discharge status: Home / Self Care | Payer: PPO | Source: Intra-hospital | Attending: Physical Medicine & Rehabilitation | Admitting: Physical Medicine & Rehabilitation

## 2018-05-04 ENCOUNTER — Inpatient Hospital Stay (HOSPITAL_COMMUNITY): Payer: PPO | Admitting: Physical Therapy

## 2018-05-04 ENCOUNTER — Inpatient Hospital Stay (HOSPITAL_COMMUNITY): Payer: PPO | Admitting: Occupational Therapy

## 2018-05-04 DIAGNOSIS — Z96612 Presence of left artificial shoulder joint: Secondary | ICD-10-CM

## 2018-05-04 DIAGNOSIS — Z882 Allergy status to sulfonamides status: Secondary | ICD-10-CM

## 2018-05-04 DIAGNOSIS — Z7902 Long term (current) use of antithrombotics/antiplatelets: Secondary | ICD-10-CM

## 2018-05-04 DIAGNOSIS — N400 Enlarged prostate without lower urinary tract symptoms: Secondary | ICD-10-CM | POA: Diagnosis present

## 2018-05-04 DIAGNOSIS — G629 Polyneuropathy, unspecified: Secondary | ICD-10-CM | POA: Diagnosis present

## 2018-05-04 DIAGNOSIS — I69354 Hemiplegia and hemiparesis following cerebral infarction affecting left non-dominant side: Secondary | ICD-10-CM

## 2018-05-04 DIAGNOSIS — Z8673 Personal history of transient ischemic attack (TIA), and cerebral infarction without residual deficits: Secondary | ICD-10-CM | POA: Diagnosis present

## 2018-05-04 DIAGNOSIS — T783XXD Angioneurotic edema, subsequent encounter: Secondary | ICD-10-CM

## 2018-05-04 DIAGNOSIS — Z79899 Other long term (current) drug therapy: Secondary | ICD-10-CM

## 2018-05-04 DIAGNOSIS — R4587 Impulsiveness: Secondary | ICD-10-CM | POA: Diagnosis present

## 2018-05-04 DIAGNOSIS — Z9889 Other specified postprocedural states: Secondary | ICD-10-CM

## 2018-05-04 DIAGNOSIS — X58XXXD Exposure to other specified factors, subsequent encounter: Secondary | ICD-10-CM | POA: Diagnosis present

## 2018-05-04 DIAGNOSIS — F17290 Nicotine dependence, other tobacco product, uncomplicated: Secondary | ICD-10-CM

## 2018-05-04 DIAGNOSIS — I110 Hypertensive heart disease with heart failure: Secondary | ICD-10-CM

## 2018-05-04 DIAGNOSIS — G8929 Other chronic pain: Secondary | ICD-10-CM

## 2018-05-04 DIAGNOSIS — Z885 Allergy status to narcotic agent status: Secondary | ICD-10-CM

## 2018-05-04 DIAGNOSIS — M549 Dorsalgia, unspecified: Secondary | ICD-10-CM

## 2018-05-04 DIAGNOSIS — Z888 Allergy status to other drugs, medicaments and biological substances status: Secondary | ICD-10-CM

## 2018-05-04 DIAGNOSIS — I2510 Atherosclerotic heart disease of native coronary artery without angina pectoris: Secondary | ICD-10-CM

## 2018-05-04 DIAGNOSIS — Z79891 Long term (current) use of opiate analgesic: Secondary | ICD-10-CM

## 2018-05-04 DIAGNOSIS — Z7982 Long term (current) use of aspirin: Secondary | ICD-10-CM

## 2018-05-04 DIAGNOSIS — Z8249 Family history of ischemic heart disease and other diseases of the circulatory system: Secondary | ICD-10-CM

## 2018-05-04 DIAGNOSIS — R269 Unspecified abnormalities of gait and mobility: Secondary | ICD-10-CM

## 2018-05-04 DIAGNOSIS — F419 Anxiety disorder, unspecified: Secondary | ICD-10-CM

## 2018-05-04 DIAGNOSIS — T464X5D Adverse effect of angiotensin-converting-enzyme inhibitors, subsequent encounter: Secondary | ICD-10-CM

## 2018-05-04 DIAGNOSIS — Z96653 Presence of artificial knee joint, bilateral: Secondary | ICD-10-CM | POA: Diagnosis present

## 2018-05-04 DIAGNOSIS — E785 Hyperlipidemia, unspecified: Secondary | ICD-10-CM

## 2018-05-04 DIAGNOSIS — I69398 Other sequelae of cerebral infarction: Secondary | ICD-10-CM

## 2018-05-04 DIAGNOSIS — I214 Non-ST elevation (NSTEMI) myocardial infarction: Secondary | ICD-10-CM

## 2018-05-04 DIAGNOSIS — I25119 Atherosclerotic heart disease of native coronary artery with unspecified angina pectoris: Secondary | ICD-10-CM | POA: Diagnosis not present

## 2018-05-04 DIAGNOSIS — R748 Abnormal levels of other serum enzymes: Secondary | ICD-10-CM

## 2018-05-04 DIAGNOSIS — I5032 Chronic diastolic (congestive) heart failure: Secondary | ICD-10-CM

## 2018-05-04 DIAGNOSIS — I208 Other forms of angina pectoris: Secondary | ICD-10-CM

## 2018-05-04 DIAGNOSIS — I6389 Other cerebral infarction: Secondary | ICD-10-CM

## 2018-05-04 LAB — BASIC METABOLIC PANEL
Anion gap: 9 (ref 5–15)
BUN: 17 mg/dL (ref 8–23)
CHLORIDE: 99 mmol/L (ref 98–111)
CO2: 28 mmol/L (ref 22–32)
CREATININE: 1.03 mg/dL (ref 0.61–1.24)
Calcium: 9.5 mg/dL (ref 8.9–10.3)
GFR calc Af Amer: >60 mL/min (ref >60)
GFR calc non Af Amer: >60 mL/min (ref >60)
GLUCOSE: 99 mg/dL (ref 70–99)
Potassium: 4.8 mmol/L (ref 3.5–5.1)
SODIUM: 136 mmol/L (ref 135–145)

## 2018-05-04 LAB — CBC
HCT: 38.3 % — ABNORMAL LOW (ref 39.0–52.0)
Hemoglobin: 12.2 g/dL — ABNORMAL LOW (ref 13.0–17.0)
MCH: 28.4 pg (ref 26.0–34.0)
MCHC: 31.9 g/dL (ref 30.0–36.0)
MCV: 89.1 fL (ref 78.0–100.0)
Platelets: 246 10*3/uL (ref 150–400)
RBC: 4.3 MIL/uL (ref 4.22–5.81)
RDW: 14.2 % (ref 11.5–15.5)
WBC: 6.4 10*3/uL (ref 4.0–10.5)

## 2018-05-04 MED ORDER — ASPIRIN 325 MG PO TABS
325.0000 mg | ORAL_TABLET | Freq: Every day | ORAL | Status: DC
  Administered 2018-05-04 – 2018-05-07 (×4): 325 mg via ORAL
  Filled 2018-05-04 (×4): qty 1

## 2018-05-04 MED ORDER — OXYCODONE HCL 5 MG PO TABS
20.0000 mg | ORAL_TABLET | ORAL | Status: DC | PRN
  Administered 2018-05-04 – 2018-05-07 (×16): 20 mg via ORAL
  Filled 2018-05-04 (×16): qty 4

## 2018-05-04 MED ORDER — ATORVASTATIN CALCIUM 40 MG PO TABS
40.0000 mg | ORAL_TABLET | Freq: Every day | ORAL | Status: DC
  Administered 2018-05-04 – 2018-05-06 (×3): 40 mg via ORAL
  Filled 2018-05-04 (×3): qty 1

## 2018-05-04 MED ORDER — CLOPIDOGREL BISULFATE 75 MG PO TABS
75.0000 mg | ORAL_TABLET | Freq: Every day | ORAL | Status: DC
  Administered 2018-05-04 – 2018-05-07 (×4): 75 mg via ORAL
  Filled 2018-05-04 (×4): qty 1

## 2018-05-04 MED ORDER — PANTOPRAZOLE SODIUM 40 MG PO TBEC
40.0000 mg | DELAYED_RELEASE_TABLET | Freq: Every day | ORAL | Status: DC
  Administered 2018-05-04 – 2018-05-07 (×4): 40 mg via ORAL
  Filled 2018-05-04 (×4): qty 1

## 2018-05-04 MED ORDER — RAMELTEON 8 MG PO TABS
8.0000 mg | ORAL_TABLET | Freq: Every day | ORAL | Status: DC
  Administered 2018-05-04 – 2018-05-06 (×3): 8 mg via ORAL
  Filled 2018-05-04 (×5): qty 1

## 2018-05-04 MED ORDER — ACETAMINOPHEN 325 MG PO TABS
650.0000 mg | ORAL_TABLET | ORAL | Status: DC | PRN
  Administered 2018-05-05: 650 mg via ORAL
  Filled 2018-05-04: qty 2

## 2018-05-04 MED ORDER — OXYCODONE HCL 5 MG PO TABS
20.0000 mg | ORAL_TABLET | ORAL | Status: DC | PRN
  Administered 2018-05-04 (×2): 20 mg via ORAL
  Filled 2018-05-04 (×2): qty 4

## 2018-05-04 MED ORDER — ALFUZOSIN HCL ER 10 MG PO TB24
10.0000 mg | ORAL_TABLET | Freq: Every day | ORAL | Status: DC
  Administered 2018-05-05 – 2018-05-07 (×3): 10 mg via ORAL
  Filled 2018-05-04 (×3): qty 1

## 2018-05-04 MED ORDER — GABAPENTIN 600 MG PO TABS
300.0000 mg | ORAL_TABLET | Freq: Three times a day (TID) | ORAL | Status: DC
  Administered 2018-05-04 – 2018-05-06 (×5): 300 mg via ORAL
  Filled 2018-05-04 (×5): qty 1

## 2018-05-04 MED ORDER — SORBITOL 70 % SOLN
30.0000 mL | Freq: Every day | Status: DC | PRN
  Administered 2018-05-05: 30 mL via ORAL
  Filled 2018-05-04: qty 30

## 2018-05-04 NOTE — Evaluation (Deleted)
  The note originally documented on this encounter has been moved the the encounter in which it belongs.  

## 2018-05-04 NOTE — Progress Notes (Signed)
Physical Medicine and Rehabilitation Admission H&P     HPI: William Jordan is a 72 year old right-handed male history of hypertension, idiopathic peripheral neuropathy with spinal cord stimulator 2016 per Melina Schools and chronic back pain, angioedema and diastolic congestive heart failure.  Per chart review lives with spouse.  Independent prior to admission.  One level home.  Patient was recently discharged from [facility] for angioedema from ACE I.  Presented 04/25/2018 with left-sided weakness.  While in the emergency department developed tonic-clonic jerking of left arm.  CT of the head showed increase conspicuity of multiple small cortical infarctions in the right posterior occipital and parietal lobes.  CT angiogram of head and neck showed atherosclerotic changes without significant stenosis.  MRI not completed due to nerve stimulator.  EEG negative for seizure.  Echocardiogram with ejection fraction of 17% systolic function was normal.  Venous Doppler studies lower extremity negative for DVT.  TEE completed 04/27/2018 with ejection fraction of 60%.  No thrombus.  Negative bubble study.  Underwent loop recorder placement.  Latest fall cranial CT scan 04/27/2018 showing unchanged appearance of multiple areas of acute disc subacute ischemia in the right parietal occipital lobe.  No acute hemorrhage.  Maintain on aspirin and Plavix for CVA.  Subcutaneous heparin for DVT prophylaxis.  Patient with intermittent bouts of delirium and confusion that steadily improved.  Admitted to inpatient rehab services 04/29/2018.  On 05/01/2018 patient with nonspecific chest pain noted troponin 0 0.27 patient had been followed by cardiology services.  Elevated troponin consistent with non-STEMI.  Intravenous heparin initiated.  Patient was discharged to acute care services for cardiac catheterization 05/03/2018 showing nonobstructive CAD.  Normal LV function.  Therapies were to be initiated.  Patient was  readmitted to resume inpatient rehab services.  Review of Systems  Constitutional: Negative for chills and fever.  HENT: Negative for hearing loss.   Eyes: Negative for blurred vision.  Cardiovascular: Positive for chest pain, palpitations and leg swelling.  Gastrointestinal: Positive for constipation. Negative for heartburn and nausea.  Genitourinary: Positive for hematuria and urgency. Negative for dysuria and flank pain.  Musculoskeletal: Positive for back pain.  Skin: Negative for rash.  Neurological: Positive for tingling and focal weakness.  Psychiatric/Behavioral:       Anxiety  All other systems reviewed and are negative.      Past Medical History:  Diagnosis Date  . Angioedema    from Benazepril reaction  . Anxiety    takes Celexa  . Arthritis   . Chronic back pain   . CVA (cerebral vascular accident) ([place])   . Enlarged prostate   . History of blood transfusion   . Hypertension   . Neuropathy         Past Surgical History:  Procedure Laterality Date  . CARDIAC CATHETERIZATION  05/03/2018  . COLONOSCOPY     one polyp removed - benign  . ELBOW SURGERY Right   . HERNIA REPAIR     umbilical  . JOINT REPLACEMENT Left    knee, x 2  . JOINT REPLACEMENT Right    hip  . JOINT REPLACEMENT Left    partial shoulder  . LEFT HEART CATH AND CORONARY ANGIOGRAPHY N/A 05/03/2018   Procedure: LEFT HEART CATH AND CORONARY ANGIOGRAPHY;  Surgeon: Martinique, Peter M, MD;  Location: [facility];  Service: Cardiovascular;  Laterality: N/A;  . LOOP RECORDER INSERTION N/A 04/27/2018   Procedure: LOOP RECORDER INSERTION;  Surgeon: Evans Lance, MD;  Location:  [facility];  Service: Cardiovascular;  Laterality: N/A;  . SPINAL CORD STIMULATOR INSERTION N/A 09/28/2014   Procedure: LUMBAR SPINAL CORD STIMULATOR INSERTION;  Surgeon: Melina Schools, MD;  Location: [facility];  Service: Orthopedics;  Laterality: N/A;  . TEE WITHOUT CARDIOVERSION N/A  04/27/2018   Procedure: TRANSESOPHAGEAL ECHOCARDIOGRAM (TEE);  Surgeon: Larey Dresser, MD;  Location: [facility];  Service: Cardiovascular;  Laterality: N/A;  . TONSILLECTOMY    . VASECTOMY          Family History  Problem Relation Age of Onset  . Heart disease Father    Social History:  reports that he has been smoking e-cigarettes and cigars. He has quit using smokeless tobacco.  His smokeless tobacco use included snuff. He reports that he drinks alcohol. He reports that he does not use drugs. Allergies:       Allergies  Allergen Reactions  . Ace Inhibitors Other (See Comments)    Angioedema (throat swelling)  . Morphine Other (See Comments)    Causes confusion and "makes me crazy," per the patient  . Sulfa Antibiotics Other (See Comments)    Unknown reaction         Medications Prior to Admission  Medication Sig Dispense Refill  . alfuzosin (UROXATRAL) 10 MG 24 hr tablet Take 10 mg by mouth daily after supper.    Marland Kitchen aspirin 325 MG tablet Take 1 tablet (325 mg total) by mouth daily. 30 tablet 3  . atorvastatin (LIPITOR) 40 MG tablet Take 1 tablet (40 mg total) by mouth daily at 6 PM. 30 tablet 3  . celecoxib (CELEBREX) 100 MG capsule Take 100 mg by mouth 2 (two) times daily.     . cetirizine (ZYRTEC) 10 MG tablet Take 10 mg by mouth 2 (two) times daily.    . cholecalciferol (VITAMIN D) 1000 units tablet Take 2,000 Units by mouth daily.    . citalopram (CELEXA) 20 MG tablet Take 20 mg by mouth daily.    . clopidogrel (PLAVIX) 75 MG tablet Take 1 tablet (75 mg total) by mouth daily. 30 tablet 3  . colchicine 0.6 MG tablet Take 0.6 mg by mouth 3 (three) times daily as needed (for gout flares).     . docusate sodium (COLACE) 100 MG capsule Take 2 capsules (200 mg total) by mouth at bedtime. (Patient taking differently: Take 100 mg by mouth at bedtime. ) 30 capsule 0  . gabapentin (NEURONTIN) 600 MG tablet Take 300 mg by mouth 3 (three) times daily.       Marland Kitchen morphine (MS CONTIN) 15 MG 12 hr tablet Take 1 tablet (15 mg total) by mouth every 12 (twelve) hours. (Patient not taking: Reported on 12/10/2017) 60 tablet 0  . omeprazole (PRILOSEC) 20 MG capsule Take 20 mg by mouth daily.     . Oxycodone HCl 20 MG TABS Take 1 tablet (20 mg total) by mouth every 8 (eight) hours as needed. (Patient taking differently: Take 20 mg by mouth every 4 (four) hours as needed (pain). ) 1 tablet 0  . polyethylene glycol (MIRALAX / GLYCOLAX) packet Take 17 g by mouth daily. (Patient not taking: Reported on 04/25/2018) 14 each 0  . Saw Palmetto, Serenoa repens, (SAW PALMETTO PO) Take 1 capsule by mouth daily.    . vitamin B-12 (CYANOCOBALAMIN) 100 MCG tablet Take 1 tablet (100 mcg total) by mouth daily. (Patient not taking: Reported on 04/25/2018) 30 tablet 1    Drug Regimen Review Drug regimen was reviewed and  remains appropriate with no significant issues identified  Home: Home Living Family/patient expects to be discharged to:: Inpatient rehab Living Arrangements: Spouse/significant other Additional Comments: Pt's wife with recent R wrist surgery and unable to help physically.    Functional History: Prior Function Level of Independence: Needs assistance Gait / Transfers Assistance Needed: Was working with therapy in CIR on ambulating without AD.  ADL's / Homemaking Assistance Needed: Was working with OT at [facility] on ADL tasks.   Functional Status:  Mobility: Bed Mobility General bed mobility comments: Sitting at EOB upon entry.  Transfers Overall transfer level: Needs assistance Equipment used: 1 person hand held assist Transfers: Sit to/from Stand Sit to Stand: Min assist, Min guard General transfer comment: Pt standing impulsively with unsteadiness, and PT had to instruct pt to wait to perform further mobility. Pt with increased sway upon standing and required min to min guard for steadying.  Ambulation/Gait Ambulation/Gait assistance: Mod assist,  Min assist Gait Distance (Feet): 75 Feet Assistive device: 1 person hand held assist Gait Pattern/deviations: Decreased step length - left, Decreased step length - right, Step-through pattern, Narrow base of support, Staggering right, Staggering left General Gait Details: Pt unsteady throughout gait with decreased coordination noted in LLE. Required min A for walking without dynamic tasks. When PT added horizontal/vertical head turns, pt with LOB to R and L and required mod A to maintain balance.  Gait velocity: Decreased   ADL:  Cognition: Cognition Overall Cognitive Status: History of cognitive impairments - at baseline Orientation Level: Oriented X4 Cognition Arousal/Alertness: Awake/alert Behavior During Therapy: Impulsive Overall Cognitive Status: History of cognitive impairments - at baseline Area of Impairment: Problem solving, Attention, Following commands, Safety/judgement Current Attention Level: Sustained Following Commands: Follows one step commands with increased time Safety/Judgement: Decreased awareness of safety, Decreased awareness of deficits Problem Solving: Difficulty sequencing General Comments: Pt impulsive and required cues to wait for PT. Pt very talkative about irrelevant subjects and required cues to stay on task.   Physical Exam: Blood pressure (!) 141/86, pulse 71, temperature 98.7 F (37.1 C), temperature source Oral, resp. rate 14, weight 79.7 kg, SpO2 100 %. Physical Exam  Vitals reviewed. Constitutional: He is oriented to person, place, and time. He appears well-developed and well-nourished.  HENT:  Head: Normocephalic and atraumatic.  Eyes: EOM are normal. Right eye exhibits no discharge. Left eye exhibits no discharge.  Neck: Normal range of motion. Neck supple.  Cardiovascular: Normal rate.  Cardiac rate controlled  Respiratory: Effort normal and breath sounds normal. No respiratory distress.  GI: Soft. Bowel sounds are normal. He exhibits no  distension.  Neurological: He is alert and oriented to person, place, and time.  Patient follows commands.  Fair awareness of deficits.  Speech was mildly dysarthric but fully intelligible.  Skin: Skin is warm and dry.    LabResultsLast48Hours  Results for orders placed or performed during the hospital encounter of 05/03/18 (from the past 48 hour(s))  Basic metabolic panel     Status: None   Collection Time: 05/04/18  3:05 AM  Result Value Ref Range   Sodium 136 135 - 145 mmol/L   Potassium 4.8 3.5 - 5.1 mmol/L   Chloride 99 98 - 111 mmol/L   CO2 28 22 - 32 mmol/L   Glucose, Bld 99 70 - 99 mg/dL   BUN 17 8 - 23 mg/dL   Creatinine, Ser 1.03 0.61 - 1.24 mg/dL   Calcium 9.5 8.9 - 10.3 mg/dL   GFR calc non Af Amer >  60 >60 mL/min   GFR calc Af Amer >60 >60 mL/min    Comment: (NOTE) The eGFR has been calculated using the CKD EPI equation. This calculation has not been validated in all clinical situations. eGFR's persistently <60 mL/min signify possible Chronic Kidney Disease.    Anion gap 9 5 - 15    Comment: Performed at [facility] [address], [place], [place] [address]  CBC     Status: Abnormal   Collection Time: 05/04/18  3:05 AM  Result Value Ref Range   WBC 6.4 4.0 - 10.5 K/uL   RBC 4.30 4.22 - 5.81 MIL/uL   Hemoglobin 12.2 (L) 13.0 - 17.0 g/dL   HCT 38.3 (L) 39.0 - 52.0 %   MCV 89.1 78.0 - 100.0 fL   MCH 28.4 26.0 - 34.0 pg   MCHC 31.9 30.0 - 36.0 g/dL   RDW 14.2 11.5 - 15.5 %   Platelets 246 150 - 400 K/uL    Comment: Performed at [facility], [place] [address] [place], [place] [address]     ImagingResults(Last48hours)  No results found.       Medical Problem List and Plan: 1.  Left-sided weakness secondary to right parieto-occipital lobe infarction.  Status post loop recorder. Pt is being readmitted for an interrupted stay after STEMI. Resume prior functional plan, discharge goals remain attainable.    2.  DVT Prophylaxis/Anticoagulation: SCDs 3. Pain Management: Neurontin 300 mg 3 times daily, oxycodone as needed 4. Mood: Provide emotional support.Rozerem 8 mg daily 5. Neuropsych: This patient is capable of making decisions on his own behalf. 6. Skin/Wound Care: Routine skin checks 7. Fluids/Electrolytes/Nutrition: Routine in and outs with follow-up chemistries 8.  Non-STEMI.  Status post cardiac catheterization.  Continue aspirin.  Follow cardiology services.              Monitor for activity tolerance with therapies 9.  Diastolic congestive heart failure.  Monitor for any signs of fluid overload             -daily weights 10.  Angioedema.  Patient recently discharged from [facility] for treatment of angioedema from ACE inhibitor.  Continue to monitor 11.  Hyperlipidemia.  Lipitor  I have personally performed a face to face diagnostic evaluation of this patient and formulated the key components of the plan.  Additionally, I have personally reviewed laboratory data, imaging studies, as well as relevant notes and concur with the physician assistant's documentation above.  Meredith Staggers, MD, FAAPMR   Lavon Paganini [place], PA-C 05/04/2018

## 2018-05-04 NOTE — Progress Notes (Addendum)
William Gong, RN  Rehab Admission Coordinator  Physical Medicine and Rehabilitation  PMR Pre-admission  Signed  Date of Service:  04/28/2018 2:23 PM       Related encounter: ED to Hosp-Admission (Current) from 04/25/2018 in [facility]      Signed         Show:Clear all [x] Manual[x] Template[x] Copied  Added by: [x] Julious Payer Vertis Kelch, RN  [] Hover for details PMR Admission Coordinator Pre-Admission Assessment  Patient: William Jordan is an 72 y.o., male MRN: [id number] DOB: 12-21-1945 Height: 5\' 10"  (177.8 cm) Weight: 83.9 kg                                                                                                                                                  Insurance Information HMO:     PPO: yes     PCP:      IPA:      80/20:      OTHER: medicare advantage plan PRIMARY: [employer]      Policy#: [id number]      Subscriber: pt CM Name: William Jordan      Phone#: [phone]     Fax#: Epic access Pre-Cert#: [id number]  Approved for 7 days    Employer: retired Benefits:  Phone #: [phone]     Name: 04/28/18 Eff. Date: 09/15/2017     Deduct: none      Out of Pocket Max: $3400      Life Max: none CIR: $295 co pay per day days 1 until 6      SNF: $20 co pay per day days 1 until 20; $160 co pay per day days 21- 100 Outpatient: $15 co pay per visit     Co-Pay: visits per medical neccesity Home Health: 100%      Co-Pay: visits per medical neccesity DME: 80%     Co-Pay: 20% Providers: in network  SECONDARY: none      Medicaid Application Date:       Case Manager:  Disability Application Date:       Case Worker:   Emergency Publishing copy Information    Name Relation Home Work Mobile   [place] Spouse [phone]  [phone]     Current Medical History  Patient Admitting Diagnosis: right parietal occipital infarcts  History of Present Illness:    : HPI:William Jordan is a 72 year old right-handed  male with history of hypertension, idiopathic peripheral neuropathy with spinal cord stimulator 2016 per Dr. Melina Schools and chronic back pain, angioedema and diastolic congestive heart failure. Patient with recent discharge from [facility] for angioedema from ACE I.presented 04/25/2018 with left-sided weakness. While in the emergency department he developed tonic-clonic jerking of his left arm. CT the head showed increase conspicuityof multiple small cortical infarctions in  the right posterior occipital and parietal lobes. CT angiogram of head and neck showed atherosclerotic changes without significant stenosis. MRI not completed due to nerve stimulator. EEG negative for seizure. Echocardiogram with ejection fraction of 73% and systolic function was normal. Venous Dopplers lower extremities negative for DVT. TEE completed 04/27/2018 with ejection fraction 60%. No thrombus or PFO. Negative bubble study. Underwent loop recorder placement. Latest follow-up cranial CT scan 04/27/2018 showing unchanged appearance of multiple areas of acute disc subacute ischemia in the right parietal and occipital lobes. No acute hemorrhage. Currently maintained on aspirin and Plavix for CVA prophylaxis x3 months then Plavix alone. Subcutaneous heparin for DVT prophylaxis. Patient with intermittent bouts of delirium and confusion that have improved. Maintain on a regular diet.  Complete NIHSS TOTAL: 1  Past Medical History      Past Medical History:  Diagnosis Date  . Angioedema    from Benazepril reaction  . Anxiety    takes Celexa  . Arthritis   . Chronic back pain   . Enlarged prostate   . History of blood transfusion   . Hypertension   . Neuropathy     Family History  family history includes Heart disease in his father.  Prior Rehab/Hospitalizations:  Has the patient had major surgery during 100 days prior to admission? No  Current Medications   Current  Facility-Administered Medications:  .  0.9 %  sodium chloride infusion, , Intravenous, PRN, Evans Lance, MD, Last Rate: 10 mL/hr at 04/26/18 1123 .  acetaminophen (TYLENOL) tablet 325-650 mg, 325-650 mg, Oral, Q4H PRN, Evans Lance, MD, 650 mg at 04/27/18 2005 .  aspirin tablet 325 mg, 325 mg, Oral, Daily, Evans Lance, MD, 325 mg at 04/28/18 0830 .  atorvastatin (LIPITOR) tablet 40 mg, 40 mg, Oral, q1800, Evans Lance, MD, 40 mg at 04/27/18 1659 .  clopidogrel (PLAVIX) tablet 75 mg, 75 mg, Oral, Daily, Evans Lance, MD, 75 mg at 04/28/18 0830 .  gabapentin (NEURONTIN) tablet 300 mg, 300 mg, Oral, TID, Evans Lance, MD, 300 mg at 04/28/18 0829 .  heparin injection 5,000 Units, 5,000 Units, Subcutaneous, Q8H, Evans Lance, MD, 5,000 Units at 04/28/18 1414 .  ondansetron (ZOFRAN) injection 4 mg, 4 mg, Intravenous, Q6H PRN, Evans Lance, MD .  oxyCODONE (Oxy IR/ROXICODONE) immediate release tablet 20 mg, 20 mg, Oral, Q4H PRN, Evans Lance, MD, 20 mg at 04/28/18 1414 .  pantoprazole (PROTONIX) EC tablet 40 mg, 40 mg, Oral, Daily, Evans Lance, MD, 40 mg at 04/28/18 0830 .  ramelteon (ROZEREM) tablet 8 mg, 8 mg, Oral, QHS, Evans Lance, MD, 8 mg at 04/27/18 2203  Patients Current Diet:     Diet Order             Diet Heart Room service appropriate? 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 Has peripheral neuropathy and when he is having a bad day , he will use a Education administrator / [employer] Devices/Equipment: Eyeglasses Home Equipment: Walker - 2 wheels, Hand held shower head, Grab bars  - tub/shower,  Shower seat - built in, Hodges - single point, Environmental consultant - 4 wheels  Prior Device Use: Indicate devices/aids used by the patient prior to current illness, exacerbation or injury? None of the above  Prior Functional Level Prior Function Level of Independence: Independent Comments: drives, cooks  Self Care: Did the patient need help bathing, dressing, using the toilet or eating?  Independent  Indoor Mobility: Did the patient need assistance with walking from room to room (with or without device)? Independent  Stairs: Did the patient need assistance with internal or external stairs (with or without device)? Independent  Functional Cognition: Did the patient need help planning regular tasks such as shopping or remembering to take medications? Independent  Current Functional Level Cognition  Overall Cognitive Status: No family/caregiver present to determine baseline cognitive functioning Current Attention Level: Sustained Orientation Level: Oriented X4 Following Commands: Follows one step commands with increased time, Follows one step commands consistently Safety/Judgement: Decreased awareness of safety, Decreased awareness of deficits General Comments: At one point Pt meowed during session "I just like cats"    Extremity Assessment (includes Sensation/Coordination)  Upper Extremity Assessment: LUE deficits/detail, Generalized weakness, RUE deficits/detail RUE Deficits / Details: pain in R shoulder limiting ROM to approx 90 degrees FF RUE: Unable to fully assess due to pain LUE Deficits / Details: grasp grossly 3/5, decreased sensation, biceps 4/5, triceps 4/5, decreased coordination for fine and gross motor, shoulder FF requires increased time and concentration LUE Sensation: decreased light touch LUE Coordination: decreased gross motor, decreased fine motor  Lower Extremity Assessment: LLE deficits/detail LLE Deficits / Details: noted LLE strength assymetry compared  to Right gross motions 3+ /5  LLE Sensation: decreased proprioception LLE Coordination: decreased fine motor, decreased gross motor    ADLs  Overall ADL's : Needs assistance/impaired Eating/Feeding: Set up, Sitting Eating/Feeding Details (indicate cue type and reason): required assist for opening containers Grooming: Minimal assistance, Sitting, Wash/dry hands, Wash/dry face Grooming Details (indicate cue type and reason): min A for tasks that require BUE assist Upper Body Bathing: Minimal assistance Lower Body Bathing: Moderate assistance Upper Body Dressing : Minimal assistance, Sitting Lower Body Dressing: Moderate assistance, Sit to/from stand Lower Body Dressing Details (indicate cue type and reason): required assist for donning socks, able to pull up after OT initiated Toilet Transfer: Moderate assistance, +2 for physical assistance, +2 for safety/equipment, Ambulation Toilet Transfer Details (indicate cue type and reason): 2 person HHA for short ambulation Toileting- Clothing Manipulation and Hygiene: Moderate assistance, Sit to/from stand Functional mobility during ADLs: Moderate assistance, +2 for physical assistance, +2 for safety/equipment(2 person HHA) General ADL Comments: LUE decrease in function impacting performance as well as     Mobility  Overall bed mobility: Needs Assistance Bed Mobility: Supine to Sit Supine to sit: Min assist, HOB elevated General bed mobility comments: assist to steady; increased time and use of rail     Transfers  Overall transfer level: Needs assistance Equipment used: 2 person hand held assist Transfers: Sit to/from Stand Sit to Stand: +2 physical assistance, +2 safety/equipment, Min assist General transfer comment: assistance for balance and cues for safety    Ambulation / Gait / Stairs / Wheelchair Mobility  Ambulation/Gait Ambulation/Gait assistance: Mod assist, +2 physical assistance, +2 safety/equipment Gait Distance (Feet):  100 Feet Assistive device: 2 person hand held assist Gait Pattern/deviations: Decreased step length - left, Decreased step length - right, Step-through pattern, Narrow base of support General Gait Details: pt requires assistance for balance especially with horizontal head turns; L side  stronger today; near scissoring when distracted by environment Gait velocity: decreased Gait velocity interpretation: <1.31 ft/sec, indicative of household Ambulator    Posture / Balance Balance Overall balance assessment: Needs assistance Sitting-balance support: No upper extremity supported, Feet supported Sitting balance-Leahy Scale: Fair Standing balance support: Bilateral upper extremity supported Standing balance-Leahy Scale: Poor Standing balance comment: dependent on external support from therapy team    Special needs/care consideration BiPAP/CPAP  N/a CPM n/a Continuous Drip IV n/a Dialysis  N/a Life Vest n/a Oxygen n/a Special Bed n/a Trach Size n/a Wound Vac n/a Skin intact              Bowel mgmt: continent Bladder mgmt: continent Diabetic mgmt Hgb A1c 5.8 impulsive with decreased safety awareness Seizure precautions   Previous Home Environment Living Arrangements: Spouse/significant other  Lives With: Spouse Available Help at Discharge: Family, Available 24 hours/day Type of Home: House Home Layout: One level Home Access: Stairs to enter Entrance Stairs-Rails: Right, Can reach both, Left Entrance Stairs-Number of Steps: 6 Bathroom Shower/Tub: Multimedia programmer: Handicapped height Bathroom Accessibility: Yes How Accessible: Accessible via wheelchair, Accessible via walker Home Care Services: No  Discharge Living Setting Plans for Discharge Living Setting: Patient's home, Lives with (comment)(spouse) Type of Home at Discharge: House Discharge Home Layout: One level Discharge Home Access: Stairs to enter Entrance Stairs-Rails: Right, Left, Can reach  both Entrance Stairs-Number of Steps: 6 Discharge Bathroom Shower/Tub: Walk-in shower Discharge Bathroom Toilet: Handicapped height Discharge Bathroom Accessibility: Yes How Accessible: Accessible via walker Does the patient have any problems obtaining your medications?: No  Social/Family/Support Systems Patient Roles: Spouse Contact Information: Judeen Hammans, wife Anticipated Caregiver: wife Anticipated Caregiver's Contact Information: [phone] Ability/Limitations of Caregiver: no limitations Caregiver Availability: 24/7 Discharge Plan Discussed with Primary Caregiver: Yes Is Caregiver In Agreement with Plan?: Yes Does Caregiver/Family have Issues with Lodging/Transportation while Pt is in Rehab?: No  Goals/Additional Needs Patient/Family Goal for Rehab: Mod I to supervision to PT, OT, and SLP Expected length of stay: ELOS 12 to 18 days Special Service Needs: Decreased safety awareness; very impulsive Pt/Family Agrees to Admission and willing to participate: Yes Program Orientation Provided & Reviewed with Pt/Caregiver Including Roles  & Responsibilities: Yes  Decrease burden of Care through IP rehab admission: n/a  Possible need for SNF placement upon discharge:not anticipated   Patient Condition: This patient's condition remains as documented in the consult dated 04/27/2018, in which the Rehabilitation Physician determined and documented that the patient's condition is appropriate for intensive rehabilitative care in an inpatient rehabilitation facility. Will admit to inpatient rehab today.  Preadmission Screen Completed By:  Cleatrice Burke, 04/28/2018 2:23 PM ______________________________________________________________________   Discussed status with Dr. Letta Pate on 04/28/2018 at  95 and received telephone approval for admission today.  Admission Coordinator:  Cleatrice Burke, time 5732 Date 04/28/2018           Cosigned by: Charlett Blake, MD at 04/28/2018 2:45 PM  Revision History                    On 05/01/2018 patient with nonspecific chest pain noted troponin  0.27 patient had been followed by cardiology services.  Elevated troponin consistent with non-STEMI.  Intravenous heparin initiated.  Patient was discharged to acute care services under observation for cardiac catheterization 05/03/2018 showing nonobstructive CAD, normal LV function and normal LVEDP. No culprit lesion noted on cath. Patient to be continued on medical management, continued on ASA. No chest pain post cath.  Cardiology does recommend switching Prilosec to Protonix given he is on Plavix. Patient will be readmitted to inpatient rehab today as an interrupted stay on 05/04/2018 at 1435. Case discussed with Dr. Naaman Plummer and in agreement to readmit patient today.

## 2018-05-04 NOTE — Progress Notes (Signed)
Patient ID: William Jordan, male   DOB: [birth year], 72 y.o.   MRN: [id number] Patient arrived from [facility] via wheelchair with RN and belongings. Patient reoriented to rehab and room. Patient received pain medicine for 6/10 pain. Patient resting in bed with bed alarm on and call bell within reach.

## 2018-05-04 NOTE — Progress Notes (Signed)
Report given to RN Marzetta Board, pt will discharge back to inpatient rehab, CIR room [address].

## 2018-05-04 NOTE — Evaluation (Signed)
Physical Therapy Evaluation Patient Details Name: William Jordan MRN: [id number] DOB: [birth year] Today's Date: 05/04/2018   History of Present Illness  Pt admitted for L heart cath and possible NSTEMI from CIR. Pt with recent admission secondary to CVA. PMH includes HTN, chronic back pain, peripheral neuropathy, and CHF.   Clinical Impression  Pt admitted secondary to problem above with deficits below. Pt unsteady during gait, especially with dynamic gait tasks, and required min to mod A for stability. Pt also presenting with decreased coordination of L extremities. Pt unaware of current deficits and required in depth explanation of deficits. Recommend return to CIR at d/c to continue working on improving functional mobility independence and safety. Pt with excellent support at home from his wife and is extremely motivated to regain independence. Will continue to follow acutely.     Follow Up Recommendations CIR;Supervision/Assistance - 24 hour    Equipment Recommendations  None recommended by PT    Recommendations for Other Services Rehab consult     Precautions / Restrictions Precautions Precautions: Fall Restrictions Weight Bearing Restrictions: No      Mobility  Bed Mobility               General bed mobility comments: Sitting at EOB upon entry.   Transfers Overall transfer level: Needs assistance Equipment used: 1 person hand held assist Transfers: Sit to/from Stand Sit to Stand: Min assist;Min guard         General transfer comment: Pt standing impulsively with unsteadiness, and PT had to instruct pt to wait to perform further mobility. Pt with increased sway upon standing and required min to min guard for steadying.   Ambulation/Gait Ambulation/Gait assistance: Mod assist;Min assist Gait Distance (Feet): 75 Feet Assistive device: 1 person hand held assist Gait Pattern/deviations: Decreased step length - left;Decreased step length - right;Step-through  pattern;Narrow base of support;Staggering right;Staggering left Gait velocity: Decreased    General Gait Details: Pt unsteady throughout gait with decreased coordination noted in LLE. Required min A for walking without dynamic tasks. When PT added horizontal/vertical head turns, pt with LOB to R and L and required mod A to maintain balance.   Stairs            Wheelchair Mobility    Modified Rankin (Stroke Patients Only) Modified Rankin (Stroke Patients Only) Pre-Morbid Rankin Score: No symptoms Modified Rankin: Moderately severe disability     Balance Overall balance assessment: Needs assistance Sitting-balance support: No upper extremity supported;Feet supported Sitting balance-Leahy Scale: Fair     Standing balance support: Single extremity supported;During functional activity Standing balance-Leahy Scale: Poor Standing balance comment: Reliant on external support for balance.                              Pertinent Vitals/Pain Pain Assessment: No/denies pain    Home Living Family/patient expects to be discharged to:: Inpatient rehab Living Arrangements: Spouse/significant other               Additional Comments: Pt's wife with recent R wrist surgery and unable to help physically.     Prior Function Level of Independence: Needs assistance   Gait / Transfers Assistance Needed: Was working with therapy in CIR on ambulating without AD.   ADL's / Homemaking Assistance Needed: Was working with OT at [facility] [place] on ADL tasks.         Hand Dominance        Extremity/Trunk Assessment   Upper  Extremity Assessment Upper Extremity Assessment: Defer to OT evaluation;LUE deficits/detail LUE Deficits / Details: Decreased gross/fine motor. Pt unaware of LUE position in space and almost knocked over coffee during session. Requires increased time and concentration.  LUE Coordination: decreased fine motor;decreased gross motor    Lower Extremity  Assessment Lower Extremity Assessment: LLE deficits/detail LLE Deficits / Details: Noted functional weakness during ambulation and decreased coordination.  LLE Coordination: decreased fine motor;decreased gross motor    Cervical / Trunk Assessment Cervical / Trunk Assessment: Normal  Communication   Communication: No difficulties  Cognition Arousal/Alertness: Awake/alert Behavior During Therapy: Impulsive Overall Cognitive Status: History of cognitive impairments - at baseline Area of Impairment: Problem solving;Attention;Following commands;Safety/judgement                   Current Attention Level: Sustained   Following Commands: Follows one step commands with increased time Safety/Judgement: Decreased awareness of safety;Decreased awareness of deficits   Problem Solving: Difficulty sequencing General Comments: Pt impulsive and required cues to wait for PT. Pt very talkative about irrelevant subjects and required cues to stay on task.       General Comments General comments (skin integrity, edema, etc.): Pt's wife present during session. Educated about current deficits and recommendations to return to CIR for further rehab prior to return home.     Exercises     Assessment/Plan    PT Assessment Patient needs continued PT services  PT Problem List Decreased strength;Decreased balance;Decreased mobility;Decreased coordination;Decreased safety awareness;Impaired sensation;Pain       PT Treatment Interventions DME instruction;Gait training;Stair training;Functional mobility training;Therapeutic activities;Therapeutic exercise;Balance training;Neuromuscular re-education;Patient/family education    PT Goals (Current goals can be found in the Care Plan section)  Acute Rehab PT Goals Patient Stated Goal: "to be able to cook again"  PT Goal Formulation: With patient Time For Goal Achievement: 05/17/18 Potential to Achieve Goals: Good    Frequency Min 4X/week    Barriers to discharge        Co-evaluation               AM-PAC PT "6 Clicks" Daily Activity  Outcome Measure Difficulty turning over in bed (including adjusting bedclothes, sheets and blankets)?: A Little Difficulty moving from lying on back to sitting on the side of the bed? : A Little Difficulty sitting down on and standing up from a chair with arms (e.g., wheelchair, bedside commode, etc,.)?: Unable Help needed moving to and from a bed to chair (including a wheelchair)?: A Little Help needed walking in hospital room?: A Lot Help needed climbing 3-5 steps with a railing? : A Lot 6 Click Score: 14    End of Session Equipment Utilized During Treatment: Gait belt Activity Tolerance: Patient tolerated treatment well Patient left: in bed;with call bell/phone within reach;with family/visitor present Nurse Communication: Mobility status PT Visit Diagnosis: Difficulty in walking, not elsewhere classified (R26.2);Other symptoms and signs involving the nervous system (R29.898);Unsteadiness on feet (R26.81);Muscle weakness (generalized) (M62.81);Ataxic gait (R26.0)    Time: 3976-7341 PT Time Calculation (min) (ACUTE ONLY): 23 min   Charges:   PT Evaluation $PT Eval Low Complexity: 1 Low PT Treatments $Gait Training: 8-22 mins        Leighton Ruff, PT, DPT  Acute Rehabilitation Services  Pager: [phone]   Rudean Hitt 05/04/2018, 11:58 AM

## 2018-05-04 NOTE — Progress Notes (Signed)
Inpatient Rehabilitation Admissions Coordinator  I met with patient with his RN, Tammy, at bedside. Pt requesting to d/c home with outpatient therapy, not to return to CIR. Wife on her way to the hospital. I spoke with Dr. Martinique and he states pt medically ready to readmit to CIR or d/c home pending therapy evals and pt/wife discussion this morning. I have notified acute therapy office of need for PT eval asap for imminent d/c today. I have asked RN, Lynelle Smoke, to notify me of when wife arrives so that I can met with pt and wife to clarify their wishes.  Danne Baxter, RN, MSN Rehab Admissions Coordinator [phone] 05/04/2018 9:05 AM

## 2018-05-04 NOTE — Progress Notes (Signed)
Inpatient Rehabilitation Admissions Coordinator  I have insurance approval to readmit patient to inpt rehab today as an interrupted stay. I contacted Reino Bellis, NP and we will make the arrangements to admit today.  Danne Baxter, RN, MSN Rehab Admissions Coordinator [phone] 05/04/2018 2:12 PM

## 2018-05-04 NOTE — H&P (Signed)
Physical Medicine and Rehabilitation Admission H&P     HPI: William Jordan is a 72 year old right-handed male history of hypertension, idiopathic peripheral neuropathy with spinal cord stimulator 2016 per Melina Schools and chronic back pain, angioedema and diastolic congestive heart failure.  Per chart review lives with spouse.  Independent prior to admission.  One level home.  Patient was recently discharged from [facility] for angioedema from ACE I.  Presented 04/25/2018 with left-sided weakness.  While in the emergency department developed tonic-clonic jerking of left arm.  CT of the head showed increase conspicuity of multiple small cortical infarctions in the right posterior occipital and parietal lobes.  CT angiogram of head and neck showed atherosclerotic changes without significant stenosis.  MRI not completed due to nerve stimulator.  EEG negative for seizure.  Echocardiogram with ejection fraction of 49% systolic function was normal.  Venous Doppler studies lower extremity negative for DVT.  TEE completed 04/27/2018 with ejection fraction of 60%.  No thrombus.  Negative bubble study.  Underwent loop recorder placement.  Latest fall cranial CT scan 04/27/2018 showing unchanged appearance of multiple areas of acute disc subacute ischemia in the right parietal occipital lobe.  No acute hemorrhage.  Maintain on aspirin and Plavix for CVA.  Subcutaneous heparin for DVT prophylaxis.  Patient with intermittent bouts of delirium and confusion that steadily improved.  Admitted to inpatient rehab services 04/29/2018.  On 05/01/2018 patient with nonspecific chest pain noted troponin 0 0.27 patient had been followed by cardiology services.  Elevated troponin consistent with non-STEMI.  Intravenous heparin initiated.  Patient was discharged to acute care services for cardiac catheterization 05/03/2018 showing nonobstructive CAD.  Normal LV function.  Therapies were to be initiated.  Patient was readmitted  to resume inpatient rehab services.  Review of Systems  Constitutional: Negative for chills and fever.  HENT: Negative for hearing loss.   Eyes: Negative for blurred vision.  Cardiovascular: Positive for chest pain, palpitations and leg swelling.  Gastrointestinal: Positive for constipation. Negative for heartburn and nausea.  Genitourinary: Positive for hematuria and urgency. Negative for dysuria and flank pain.  Musculoskeletal: Positive for back pain.  Skin: Negative for rash.  Neurological: Positive for tingling and focal weakness.  Psychiatric/Behavioral:       Anxiety  All other systems reviewed and are negative.  Past Medical History:  Diagnosis Date  . Angioedema    from Benazepril reaction  . Anxiety    takes Celexa  . Arthritis   . Chronic back pain   . CVA (cerebral vascular accident) ([place])   . Enlarged prostate   . History of blood transfusion   . Hypertension   . Neuropathy    Past Surgical History:  Procedure Laterality Date  . CARDIAC CATHETERIZATION  05/03/2018  . COLONOSCOPY     one polyp removed - benign  . ELBOW SURGERY Right   . HERNIA REPAIR     umbilical  . JOINT REPLACEMENT Left    knee, x 2  . JOINT REPLACEMENT Right    hip  . JOINT REPLACEMENT Left    partial shoulder  . LEFT HEART CATH AND CORONARY ANGIOGRAPHY N/A 05/03/2018   Procedure: LEFT HEART CATH AND CORONARY ANGIOGRAPHY;  Surgeon: Martinique, Peter M, MD;  Location: [place] [facility];  Service: Cardiovascular;  Laterality: N/A;  . LOOP RECORDER INSERTION N/A 04/27/2018   Procedure: LOOP RECORDER INSERTION;  Surgeon: Evans Lance, MD;  Location: [place] [facility];  Service: Cardiovascular;  Laterality:  N/A;  . SPINAL CORD STIMULATOR INSERTION N/A 09/28/2014   Procedure: LUMBAR SPINAL CORD STIMULATOR INSERTION;  Surgeon: Melina Schools, MD;  Location: [place];  Service: Orthopedics;  Laterality: N/A;  . TEE WITHOUT CARDIOVERSION N/A 04/27/2018   Procedure: TRANSESOPHAGEAL ECHOCARDIOGRAM  (TEE);  Surgeon: Larey Dresser, MD;  Location: [facility] END[facility]COPY;  Service: Cardiovascular;  Laterality: N/A;  . TONSILLECTOMY    . VASECTOMY     Family History  Problem Relation Age of Onset  . Heart disease Father    Social History:  reports that he has been smoking e-cigarettes and cigars. He has quit using smokeless tobacco.  His smokeless tobacco use included snuff. He reports that he drinks alcohol. He reports that he does not use drugs. Allergies:  Allergies  Allergen Reactions  . Ace Inhibitors Other (See Comments)    Angioedema (throat swelling)  . Morphine Other (See Comments)    Causes confusion and "makes me crazy," per the patient  . Sulfa Antibiotics Other (See Comments)    Unknown reaction   Medications Prior to Admission  Medication Sig Dispense Refill  . alfuzosin (UROXATRAL) 10 MG 24 hr tablet Take 10 mg by mouth daily after supper.    Marland Kitchen aspirin 325 MG tablet Take 1 tablet (325 mg total) by mouth daily. 30 tablet 3  . atorvastatin (LIPITOR) 40 MG tablet Take 1 tablet (40 mg total) by mouth daily at 6 PM. 30 tablet 3  . celecoxib (CELEBREX) 100 MG capsule Take 100 mg by mouth 2 (two) times daily.     . cetirizine (ZYRTEC) 10 MG tablet Take 10 mg by mouth 2 (two) times daily.    . cholecalciferol (VITAMIN D) 1000 units tablet Take 2,000 Units by mouth daily.    . citalopram (CELEXA) 20 MG tablet Take 20 mg by mouth daily.    . clopidogrel (PLAVIX) 75 MG tablet Take 1 tablet (75 mg total) by mouth daily. 30 tablet 3  . colchicine 0.6 MG tablet Take 0.6 mg by mouth 3 (three) times daily as needed (for gout flares).     . docusate sodium (COLACE) 100 MG capsule Take 2 capsules (200 mg total) by mouth at bedtime. (Patient taking differently: Take 100 mg by mouth at bedtime. ) 30 capsule 0  . gabapentin (NEURONTIN) 600 MG tablet Take 300 mg by mouth 3 (three) times daily.     Marland Kitchen morphine (MS CONTIN) 15 MG 12 hr tablet Take 1 tablet (15 mg total) by mouth every 12 (twelve)  hours. (Patient not taking: Reported on 12/10/2017) 60 tablet 0  . omeprazole (PRILOSEC) 20 MG capsule Take 20 mg by mouth daily.     . Oxycodone HCl 20 MG TABS Take 1 tablet (20 mg total) by mouth every 8 (eight) hours as needed. (Patient taking differently: Take 20 mg by mouth every 4 (four) hours as needed (pain). ) 1 tablet 0  . polyethylene glycol (MIRALAX / GLYCOLAX) packet Take 17 g by mouth daily. (Patient not taking: Reported on 04/25/2018) 14 each 0  . Saw Palmetto, Serenoa repens, (SAW PALMETTO PO) Take 1 capsule by mouth daily.    . vitamin B-12 (CYANOCOBALAMIN) 100 MCG tablet Take 1 tablet (100 mcg total) by mouth daily. (Patient not taking: Reported on 04/25/2018) 30 tablet 1    Drug Regimen Review Drug regimen was reviewed and remains appropriate with no significant issues identified  Home: Home Living Family/patient expects to be discharged to:: Inpatient rehab Living Arrangements: Spouse/significant other Additional Comments: Pt's  wife with recent R wrist surgery and unable to help physically.    Functional History: Prior Function Level of Independence: Needs assistance Gait / Transfers Assistance Needed: Was working with therapy in CIR on ambulating without AD.  ADL's / Homemaking Assistance Needed: Was working with OT at [facility] on ADL tasks.   Functional Status:  Mobility: Bed Mobility General bed mobility comments: Sitting at EOB upon entry.  Transfers Overall transfer level: Needs assistance Equipment used: 1 person hand held assist Transfers: Sit to/from Stand Sit to Stand: Min assist, Min guard General transfer comment: Pt standing impulsively with unsteadiness, and PT had to instruct pt to wait to perform further mobility. Pt with increased sway upon standing and required min to min guard for steadying.  Ambulation/Gait Ambulation/Gait assistance: Mod assist, Min assist Gait Distance (Feet): 75 Feet Assistive device: 1 person hand held assist Gait  Pattern/deviations: Decreased step length - left, Decreased step length - right, Step-through pattern, Narrow base of support, Staggering right, Staggering left General Gait Details: Pt unsteady throughout gait with decreased coordination noted in LLE. Required min A for walking without dynamic tasks. When PT added horizontal/vertical head turns, pt with LOB to R and L and required mod A to maintain balance.  Gait velocity: Decreased     ADL:    Cognition: Cognition Overall Cognitive Status: History of cognitive impairments - at baseline Orientation Level: Oriented X4 Cognition Arousal/Alertness: Awake/alert Behavior During Therapy: Impulsive Overall Cognitive Status: History of cognitive impairments - at baseline Area of Impairment: Problem solving, Attention, Following commands, Safety/judgement Current Attention Level: Sustained Following Commands: Follows one step commands with increased time Safety/Judgement: Decreased awareness of safety, Decreased awareness of deficits Problem Solving: Difficulty sequencing General Comments: Pt impulsive and required cues to wait for PT. Pt very talkative about irrelevant subjects and required cues to stay on task.   Physical Exam: Blood pressure (!) 141/86, pulse 71, temperature 98.7 F (37.1 C), temperature source Oral, resp. rate 14, weight 79.7 kg, SpO2 100 %. Physical Exam  Vitals reviewed. Constitutional: He is oriented to person, place, and time. He appears well-developed and well-nourished.  HENT:  Head: Normocephalic and atraumatic.  Eyes: EOM are normal. Right eye exhibits no discharge. Left eye exhibits no discharge.  Neck: Normal range of motion. Neck supple.  Cardiovascular: Normal rate.  Cardiac rate controlled  Respiratory: Effort normal and breath sounds normal. No respiratory distress.  GI: Soft. Bowel sounds are normal. He exhibits no distension.  Neurological: He is alert and oriented to person, place, and time.    Patient follows commands.  Fair awareness of deficits.  Speech was mildly dysarthric but fully intelligible.  Skin: Skin is warm and dry.    Results for orders placed or performed during the hospital encounter of 05/03/18 (from the past 48 hour(s))  Basic metabolic panel     Status: None   Collection Time: 05/04/18  3:05 AM  Result Value Ref Range   Sodium 136 135 - 145 mmol/L   Potassium 4.8 3.5 - 5.1 mmol/L   Chloride 99 98 - 111 mmol/L   CO2 28 22 - 32 mmol/L   Glucose, Bld 99 70 - 99 mg/dL   BUN 17 8 - 23 mg/dL   Creatinine, Ser 1.03 0.61 - 1.24 mg/dL   Calcium 9.5 8.9 - 10.3 mg/dL   GFR calc non Af Amer >60 >60 mL/min   GFR calc Af Amer >60 >60 mL/min    Comment: (NOTE) The eGFR has been calculated using  the CKD EPI equation. This calculation has not been validated in all clinical situations. eGFR's persistently <60 mL/min signify possible Chronic Kidney Disease.    Anion gap 9 5 - 15    Comment: Performed at [facility] [address], [place], [place] [address]  CBC     Status: Abnormal   Collection Time: 05/04/18  3:05 AM  Result Value Ref Range   WBC 6.4 4.0 - 10.5 K/uL   RBC 4.30 4.22 - 5.81 MIL/uL   Hemoglobin 12.2 (L) 13.0 - 17.0 g/dL   HCT 38.3 (L) 39.0 - 52.0 %   MCV 89.1 78.0 - 100.0 fL   MCH 28.4 26.0 - 34.0 pg   MCHC 31.9 30.0 - 36.0 g/dL   RDW 14.2 11.5 - 15.5 %   Platelets 246 150 - 400 K/uL    Comment: Performed at [facility], [place] [address], [place], [place] [address]   No results found.     Medical Problem List and Plan: 1.  Left-sided weakness secondary to right parieto-occipital lobe infarction.  Status post loop recorder. Pt is being readmitted for an interrupted stay after STEMI. Resume prior functional plan, discharge goals remain attainable.  2.  DVT Prophylaxis/Anticoagulation: SCDs 3. Pain Management: Neurontin 300 mg 3 times daily, oxycodone as needed 4. Mood: Provide emotional support.Rozerem 8 mg daily 5. Neuropsych:  This patient is capable of making decisions on his own behalf. 6. Skin/Wound Care: Routine skin checks 7. Fluids/Electrolytes/Nutrition: Routine in and outs with follow-up chemistries 8.  Non-STEMI.  Status post cardiac catheterization.  Continue aspirin.  Follow cardiology services.   Monitor for activity tolerance with therapies 9.  Diastolic congestive heart failure.  Monitor for any signs of fluid overload  -daily weights 10.  Angioedema.  Patient recently discharged from [facility] for treatment of angioedema from ACE inhibitor.  Continue to monitor 11.  Hyperlipidemia.  Lipitor  I have personally performed a face to face diagnostic evaluation of this patient and formulated the key components of the plan.  Additionally, I have personally reviewed laboratory data, imaging studies, as well as relevant notes and concur with the physician assistant's documentation above.  Meredith Staggers, MD, FAAPMR   Lavon Paganini [place], PA-C 05/04/2018

## 2018-05-04 NOTE — Progress Notes (Signed)
Pt transferred via wheelchair to CIR, [phone], with his belongings, escorted by unit staff. (pt returning to rehab for continued therapy before returning home).

## 2018-05-04 NOTE — H&P (Deleted)
  The note originally documented on this encounter has been moved the the encounter in which it belongs.  

## 2018-05-04 NOTE — Discharge Summary (Signed)
Discharge Summary    Patient ID: William Jordan,  MRN: [id number], DOB/AGE: March 31, 1946 72 y.o.  Admit date: 05/03/2018 Discharge date: 05/04/2018  Primary Care Provider: Townsend Roger Primary Cardiologist: Dr. Sallyanne Kuster  Discharge Diagnoses    Active Problems:   Chest pain   Elevated troponin   Ventricular tachycardia (HCC)  Allergies Allergies  Allergen Reactions  . Ace Inhibitors Other (See Comments)    Angioedema (throat swelling)  . Morphine Other (See Comments)    Causes confusion and "makes me crazy," per the patient  . Sulfa Antibiotics Other (See Comments)    Unknown reaction    Diagnostic Studies/Procedures    Cath: 05/03/18   Prox RCA to Mid RCA lesion is 50% stenosed.  Prox Cx lesion is 50% stenosed.  Mid Cx to Dist Cx lesion is 30% stenosed.  Prox LAD lesion is 35% stenosed.  The left ventricular systolic function is normal.  LV end diastolic pressure is normal.  The left ventricular ejection fraction is 55-65% by visual estimate.   1. Nonobstructive CAD 2. Normal LV function 3. Normal LVEDP  Plan: medical management.  Recommend Aspirin 81mg  daily for moderate CAD. _____________   History of Present Illness     72 yo male who was admitted for right MCA stroke likely related to large ulcerated soft plaque in the right common carotid artery. Underwent neurology work up. Ultimately, he was discharged to INpatient Rehab. Was doing well while in rehab but developed complaints of chest pain and elevated troponin consistent with non-STEMI (peak 3.09), transient episodes of nonsustained VT while in Rehab. Cardiology was consulted and recommended that he go for cardiac cath. Discharged from rehab back to inpatient bed and taken for cardiac cath.   Hospital Course   Underwent cardiac cath noted above with nonobstructive CAD, normal LV function, and normal LVEDP. No culprit lesion noted on cath. Plan to be continued on medical management, continued on  ASA. No chest pain post cath. Able to ambulate without difficulty. Seen by PT/OT prior to discharge. Patient initially expressed wanting to be discharged home. PT/OT did reevaluate and recommended readmission to rehab. Wife was in agreement with this as well. Of note, do suggest switching his Prilosec to Protonix given he is on Plavix.   General: Well developed, well nourished, male appearing in no acute distress. Head: Normocephalic, atraumatic.  Neck: Supple without bruits, JVD. Lungs:  Resp regular and unlabored, CTA. Heart: RRR, S1, S2, no murmur; no rub. Abdomen: Soft, non-tender, non-distended with normoactive bowel sounds. No hepatomegaly. No rebound/guarding. No obvious abdominal masses. Extremities: No clubbing, cyanosis, edema. Distal pedal pulses are 2+ bilaterally. R radial cath site stable without bruising or hematoma Neuro: Alert and oriented X 3.  Psych: Normal affect.  [name] was seen by Dr. Martinique and determined stable for discharge home. Follow up in the office has been arranged. Medications are listed below.   _____________  Discharge Vitals Blood pressure (!) 144/81, pulse 73, temperature 98.1 F (36.7 C), temperature source Oral, resp. rate 13, weight 79.7 kg, SpO2 98 %.  Filed Weights   05/04/18 0145  Weight: 79.7 kg    Labs & Radiologic Studies    CBC Recent Labs    05/03/18 0648 05/04/18 0305  WBC 6.6 6.4  HGB 12.1* 12.2*  HCT 37.3* 38.3*  MCV 89.0 89.1  PLT 244 702   Basic Metabolic Panel Recent Labs    05/04/18 0305  NA 136  K 4.8  CL  99  CO2 28  GLUCOSE 99  BUN 17  CREATININE 1.03  CALCIUM 9.5   Liver Function Tests No results for input(s): AST, ALT, ALKPHOS, BILITOT, PROT, ALBUMIN in the last 72 hours. No results for input(s): LIPASE, AMYLASE in the last 72 hours. Cardiac Enzymes No results for input(s): CKTOTAL, CKMB, CKMBINDEX, TROPONINI in the last 72 hours. BNP Invalid input(s): POCBNP D-Dimer No results for input(s):  DDIMER in the last 72 hours. Hemoglobin A1C No results for input(s): HGBA1C in the last 72 hours. Fasting Lipid Panel No results for input(s): CHOL, HDL, LDLCALC, TRIG, CHOLHDL, LDLDIRECT in the last 72 hours. Thyroid Function Tests No results for input(s): TSH, T4TOTAL, T3FREE, THYROIDAB in the last 72 hours.  Invalid input(s): FREET3 _____________  Ct Angio Head W Or Wo Contrast  Addendum Date: 04/25/2018   ADDENDUM REPORT: 04/25/2018 19:41 ADDENDUM: Cortical hypoattenuation along the right pre and postcentral gyrus is consistent with acute/subacute nonhemorrhagic infarct there is effacement adjacent sulci. Associated subacute arachnoid hemorrhage is considered less likely. This is likely all related to a subacute ischemic event. Findings were discussed with Dr. Lorraine Lax at 7:40 p.m. Electronically Signed   By: San Morelle M.D.   On: 04/25/2018 19:41   Result Date: 04/25/2018 CLINICAL DATA:  Left-sided weakness. Walking for 2 days. Cerebral aneurysm subarachnoid hemorrhage, cerebral vasospasm evaluation. EXAM: CT ANGIOGRAPHY HEAD AND NECK TECHNIQUE: Multidetector CT imaging of the head and neck was performed using the standard protocol during bolus administration of intravenous contrast. Multiplanar CT image reconstructions and MIPs were obtained to evaluate the vascular anatomy. Carotid stenosis measurements (when applicable) are obtained utilizing NASCET criteria, using the distal internal carotid diameter as the denominator. CONTRAST:  71mL ISOVUE-370 IOPAMIDOL (ISOVUE-370) INJECTION 76% COMPARISON:  Head without contrast 06/23/2017 FINDINGS: CT HEAD FINDINGS Brain: [name] matter hypoattenuation is present along the right pre and postcentral gyrus. No hemorrhage is present. There is no mass lesion. Ventricles proportionate to the degree of atrophy. Moderate diffuse white matter hypoattenuation is present bilaterally. Brainstem and cerebellum are otherwise normal. Vascular: Atherosclerotic  calcifications are present within the cavernous internal carotid arteries bilaterally. There is hyperdense vessel. Skull: Calvarium is intact. No focal lytic or blastic lesions are present. Sinuses: The paranasal sinuses and mastoid air cells are clear. Orbits: Globes and orbits are within bilaterally. Review of the MIP images confirms the above findings CTA NECK FINDINGS Aortic arch: There is a common origin of the innominate and left common carotid artery. Mild atherosclerotic changes are present. There is no significant stenosis the great vessel origins Right carotid system: The right common carotid artery is within normal limits proximally. Eccentric atherosclerotic changes are present in the distal right common carotid artery. Lumen is narrowed to 3.5 mm. There is no stenosis relative to the more distal vessel. Atherosclerotic calcifications are present at the carotid bifurcation without significant stenosis in the cervical ICA. Left carotid system: Left carotid artery is tortuous proximally. Atherosclerotic changes are present at the left carotid bifurcation. No significant stenosis relative the distal vessel. The cervical left ICA is normal. Vertebral arteries: The vertebral arteries are codominant. Both vertebral arteries originate from the subclavian arteries without significant stenosis. No significant injury or stenosis to either vertebral artery in the neck. Skeleton: Multilevel degenerative changes are present in the cervical spine. Grade 1 anterolisthesis is present at C3-4. There is chronic loss disc height with endplate uncovertebral disease at C4-5, C5-6, and C6-7. Osseous foraminal narrowing is greater right than left at C4-5 and C5-6. Other neck:  The soft tissues of the neck are otherwise unremarkable. Salivary glands are within normal limits. No significant adenopathy is present. Thyroid is heterogeneous without a dominant lesion. No focal mucosal lesions are present. Upper chest: Paraseptal  emphysematous changes are present along a disease. There is no focal nodule or mass lesion. Effusion is present. There is no pneumothorax. The thoracic inlet is within normal limits. Review of the MIP images confirms the above findings CTA HEAD FINDINGS Anterior circulation: Atherosclerotic calcifications are present in the cavernous internal carotid arteries bilaterally. There is no significant stenosis from the skull base to the ICA termini. The A1 and M1 segments are normal. The anterior communicating artery is patent. MCA bifurcations are within normal limits. There is moderate attenuation of distal ACA and MCA branch vessels bilaterally without a significant proximal stenosis or occlusion Posterior circulation: The vertebral arteries are codominant. Atherosclerotic changes are present at the dural margin of the left vertebral artery without significant stenosis. Vertebrobasilar junction is normal. Both posterior cerebral arteries originate from the basilar tip. There is some attenuation of distal PCA branch vessels bilaterally without a significant proximal stenosis or occlusion. Venous sinuses: The dural sinuses are patent. Straight sinus and deep cerebral veins are intact. Cortical veins are within normal limits. Anatomic variants: None Delayed phase: The postcontrast images demonstrate no pathologic enhancement. Review of the MIP images confirms the above findings IMPRESSION: 1. Atherosclerotic changes within the mid and distal right common carotid artery narrows the lumen to 3.5 mm without a significant stenosis relative to the more distal vessel. 2. Atherosclerotic changes bilaterally at the carotid bifurcations without significant stenoses. 3. Atherosclerotic changes at the dural margin of the left vertebral artery without a significant stenosis. 4. Atherosclerotic changes of the cavernous internal carotid arteries bilaterally without significant stenosis. 5. Distal medium and small vessel disease is  present throughout the anterior and posterior circulation without a significant proximal stenosis, aneurysm, or branch vessel occlusion otherwise. 6. Multilevel degenerative changes of the cervical spine as described above. Grade 1 anterolisthesis is present at C3-4. Electronically Signed: By: San Morelle M.D. On: 04/25/2018 14:58   Dg Chest 2 View  Result Date: 04/25/2018 CLINICAL DATA:  Elevated troponin.  Weakness. EXAM: CHEST - 2 VIEW COMPARISON:  April 18, 2018. FINDINGS: The heart size and mediastinal contours are within normal limits. Both lungs are clear. The visualized skeletal structures are stable. Spinal stimulator lead is unchanged. IMPRESSION: No active cardiopulmonary disease. Electronically Signed   By: Abelardo Diesel M.D.   On: 04/25/2018 14:38   Ct Head Wo Contrast  Result Date: 04/27/2018 CLINICAL DATA:  Stroke follow-up EXAM: CT HEAD WITHOUT CONTRAST TECHNIQUE: Contiguous axial images were obtained from the base of the skull through the vertex without intravenous contrast. COMPARISON:  Head CT 04/26/2018 FINDINGS: Brain: There is no mass, hemorrhage or extra-axial collection. There is generalized atrophy without lobar predilection. Unchanged appearance of multiple right occipital and parietal areas of loss of gray-white differentiation. Unchanged old left basal ganglia lacunar infarct. There is hypoattenuation of the periventricular white matter, most commonly indicating chronic ischemic microangiopathy. Vascular: No abnormal hyperdensity of the major intracranial arteries or dural venous sinuses. No intracranial atherosclerosis. Skull: The visualized skull base, calvarium and extracranial soft tissues are normal. Sinuses/Orbits: No fluid levels or advanced mucosal thickening of the visualized paranasal sinuses. No mastoid or middle ear effusion. The orbits are normal. IMPRESSION: 1. No acute hemorrhage. 2. Unchanged appearance of multiple areas of acute to subacute ischemia in  the right parietal  and occipital lobes. Electronically Signed   By: Ulyses Jarred M.D.   On: 04/27/2018 19:18   Ct Head Wo Contrast  Result Date: 04/26/2018 CLINICAL DATA:  72 y/o M; stroke for follow-up. Left-sided weakness. EXAM: CT HEAD WITHOUT CONTRAST TECHNIQUE: Contiguous axial images were obtained from the base of the skull through the vertex without intravenous contrast. COMPARISON:  04/25/2018 CT head, CTA head, CTA neck. FINDINGS: Brain: Multiple small foci of cortical hypoattenuation within the right posterior occipital and parietal lobes are increased in conspicuity when compared with the prior CT of the head. No hemorrhage or mass effect. Streak artifact from dental hardware largely obscures the posterior fossa. No extra-axial collection, hydrocephalus, effacement of basilar cisterns, or herniation. Vascular: No hyperdense vessel or unexpected calcification. Skull: Normal. Negative for fracture or focal lesion. Sinuses/Orbits: No acute finding. Other: None. IMPRESSION: Increased conspicuity of multiple small cortical infarctions in the right posterior occipital and parietal lobes. No new stroke, hemorrhage, or mass effect identified. Electronically Signed   By: Kristine Garbe M.D.   On: 04/26/2018 01:53   Ct Angio Neck W And/or Wo Contrast  Addendum Date: 04/25/2018   ADDENDUM REPORT: 04/25/2018 19:41 ADDENDUM: Cortical hypoattenuation along the right pre and postcentral gyrus is consistent with acute/subacute nonhemorrhagic infarct there is effacement adjacent sulci. Associated subacute arachnoid hemorrhage is considered less likely. This is likely all related to a subacute ischemic event. Findings were discussed with Dr. Lorraine Lax at 7:40 p.m. Electronically Signed   By: San Morelle M.D.   On: 04/25/2018 19:41   Result Date: 04/25/2018 CLINICAL DATA:  Left-sided weakness. Walking for 2 days. Cerebral aneurysm subarachnoid hemorrhage, cerebral vasospasm evaluation. EXAM: CT  ANGIOGRAPHY HEAD AND NECK TECHNIQUE: Multidetector CT imaging of the head and neck was performed using the standard protocol during bolus administration of intravenous contrast. Multiplanar CT image reconstructions and MIPs were obtained to evaluate the vascular anatomy. Carotid stenosis measurements (when applicable) are obtained utilizing NASCET criteria, using the distal internal carotid diameter as the denominator. CONTRAST:  29mL ISOVUE-370 IOPAMIDOL (ISOVUE-370) INJECTION 76% COMPARISON:  Head without contrast 06/23/2017 FINDINGS: CT HEAD FINDINGS Brain: [name] matter hypoattenuation is present along the right pre and postcentral gyrus. No hemorrhage is present. There is no mass lesion. Ventricles proportionate to the degree of atrophy. Moderate diffuse white matter hypoattenuation is present bilaterally. Brainstem and cerebellum are otherwise normal. Vascular: Atherosclerotic calcifications are present within the cavernous internal carotid arteries bilaterally. There is hyperdense vessel. Skull: Calvarium is intact. No focal lytic or blastic lesions are present. Sinuses: The paranasal sinuses and mastoid air cells are clear. Orbits: Globes and orbits are within bilaterally. Review of the MIP images confirms the above findings CTA NECK FINDINGS Aortic arch: There is a common origin of the innominate and left common carotid artery. Mild atherosclerotic changes are present. There is no significant stenosis the great vessel origins Right carotid system: The right common carotid artery is within normal limits proximally. Eccentric atherosclerotic changes are present in the distal right common carotid artery. Lumen is narrowed to 3.5 mm. There is no stenosis relative to the more distal vessel. Atherosclerotic calcifications are present at the carotid bifurcation without significant stenosis in the cervical ICA. Left carotid system: Left carotid artery is tortuous proximally. Atherosclerotic changes are present at the  left carotid bifurcation. No significant stenosis relative the distal vessel. The cervical left ICA is normal. Vertebral arteries: The vertebral arteries are codominant. Both vertebral arteries originate from the subclavian arteries without significant stenosis. No significant  injury or stenosis to either vertebral artery in the neck. Skeleton: Multilevel degenerative changes are present in the cervical spine. Grade 1 anterolisthesis is present at C3-4. There is chronic loss disc height with endplate uncovertebral disease at C4-5, C5-6, and C6-7. Osseous foraminal narrowing is greater right than left at C4-5 and C5-6. Other neck: The soft tissues of the neck are otherwise unremarkable. Salivary glands are within normal limits. No significant adenopathy is present. Thyroid is heterogeneous without a dominant lesion. No focal mucosal lesions are present. Upper chest: Paraseptal emphysematous changes are present along a disease. There is no focal nodule or mass lesion. Effusion is present. There is no pneumothorax. The thoracic inlet is within normal limits. Review of the MIP images confirms the above findings CTA HEAD FINDINGS Anterior circulation: Atherosclerotic calcifications are present in the cavernous internal carotid arteries bilaterally. There is no significant stenosis from the skull base to the ICA termini. The A1 and M1 segments are normal. The anterior communicating artery is patent. MCA bifurcations are within normal limits. There is moderate attenuation of distal ACA and MCA branch vessels bilaterally without a significant proximal stenosis or occlusion Posterior circulation: The vertebral arteries are codominant. Atherosclerotic changes are present at the dural margin of the left vertebral artery without significant stenosis. Vertebrobasilar junction is normal. Both posterior cerebral arteries originate from the basilar tip. There is some attenuation of distal PCA branch vessels bilaterally without a  significant proximal stenosis or occlusion. Venous sinuses: The dural sinuses are patent. Straight sinus and deep cerebral veins are intact. Cortical veins are within normal limits. Anatomic variants: None Delayed phase: The postcontrast images demonstrate no pathologic enhancement. Review of the MIP images confirms the above findings IMPRESSION: 1. Atherosclerotic changes within the mid and distal right common carotid artery narrows the lumen to 3.5 mm without a significant stenosis relative to the more distal vessel. 2. Atherosclerotic changes bilaterally at the carotid bifurcations without significant stenoses. 3. Atherosclerotic changes at the dural margin of the left vertebral artery without a significant stenosis. 4. Atherosclerotic changes of the cavernous internal carotid arteries bilaterally without significant stenosis. 5. Distal medium and small vessel disease is present throughout the anterior and posterior circulation without a significant proximal stenosis, aneurysm, or branch vessel occlusion otherwise. 6. Multilevel degenerative changes of the cervical spine as described above. Grade 1 anterolisthesis is present at C3-4. Electronically Signed: By: San Morelle M.D. On: 04/25/2018 14:58   Disposition   Pt is being discharged home today in good condition.  Follow-up Plans & Appointments    Follow-up Information    Croitoru, Mihai, MD Follow up.   Specialty:  Cardiology Why:  Follow up to be arranged once discharged from inpatient rehab.  Contact information: [address] [place] [address] [phone]          Discharge Instructions    Call MD for:  redness, tenderness, or signs of infection (pain, swelling, redness, odor or green/yellow discharge around incision site)   Complete by:  As directed    Diet - low sodium heart healthy   Complete by:  As directed    Discharge instructions   Complete by:  As directed    Radial Site Care Refer to this  sheet in the next few weeks. These instructions provide you with information on caring for yourself after your procedure. Your caregiver may also give you more specific instructions. Your treatment has been planned according to current medical practices, but problems sometimes occur. Call your  caregiver if you have any problems or questions after your procedure. HOME CARE INSTRUCTIONS You may shower the day after the procedure.Remove the bandage (dressing) and gently wash the site with plain soap and water.Gently pat the site dry.  Do not apply powder or lotion to the site.  Do not submerge the affected site in water for 3 to 5 days.  Inspect the site at least twice daily.  Do not flex or bend the affected arm for 24 hours.  No lifting over 5 pounds (2.3 kg) for 5 days after your procedure.  Do not drive home if you are discharged the same day of the procedure. Have someone else drive you.  You may drive 24 hours after the procedure unless otherwise instructed by your caregiver.  What to expect: Any bruising will usually fade within 1 to 2 weeks.  Blood that collects in the tissue (hematoma) may be painful to the touch. It should usually decrease in size and tenderness within 1 to 2 weeks.  SEEK IMMEDIATE MEDICAL CARE IF: You have unusual pain at the radial site.  You have redness, warmth, swelling, or pain at the radial site.  You have drainage (other than a small amount of blood on the dressing).  You have chills.  You have a fever or persistent symptoms for more than 72 hours.  You have a fever and your symptoms suddenly get worse.  Your arm becomes pale, cool, tingly, or numb.  You have heavy bleeding from the site. Hold pressure on the site.   Increase activity slowly   Complete by:  As directed        Discharge Medications     Medication List    STOP taking these medications   celecoxib 100 MG capsule Commonly known as:  CELEBREX   morphine 15 MG 12 hr tablet Commonly  known as:  MS CONTIN   omeprazole 20 MG capsule Commonly known as:  PRILOSEC   SAW PALMETTO PO     TAKE these medications   alfuzosin 10 MG 24 hr tablet Commonly known as:  UROXATRAL Take 10 mg by mouth daily after supper.   aspirin 325 MG tablet Take 1 tablet (325 mg total) by mouth daily.   atorvastatin 40 MG tablet Commonly known as:  LIPITOR Take 1 tablet (40 mg total) by mouth daily at 6 PM.   cetirizine 10 MG tablet Commonly known as:  ZYRTEC Take 10 mg by mouth 2 (two) times daily.   cholecalciferol 1000 units tablet Commonly known as:  VITAMIN D Take 2,000 Units by mouth daily.   citalopram 20 MG tablet Commonly known as:  CELEXA Take 20 mg by mouth daily.   clopidogrel 75 MG tablet Commonly known as:  PLAVIX Take 1 tablet (75 mg total) by mouth daily.   colchicine 0.6 MG tablet Take 0.6 mg by mouth 3 (three) times daily as needed (for gout flares).   docusate sodium 100 MG capsule Commonly known as:  COLACE Take 2 capsules (200 mg total) by mouth at bedtime. What changed:  how much to take   gabapentin 600 MG tablet Commonly known as:  NEURONTIN Take 300 mg by mouth 3 (three) times daily.   Oxycodone HCl 20 MG Tabs Take 1 tablet (20 mg total) by mouth every 8 (eight) hours as needed. What changed:    when to take this  reasons to take this   polyethylene glycol packet Commonly known as:  MIRALAX / GLYCOLAX Take 17 g by  mouth daily.   vitamin B-12 100 MCG tablet Commonly known as:  CYANOCOBALAMIN Take 1 tablet (100 mcg total) by mouth daily.        Acute coronary syndrome (MI, NSTEMI, STEMI, etc) this admission?: No.     Outstanding Labs/Studies   N/a   Duration of Discharge Encounter   Greater than 30 minutes including physician time.  Signed, Reino Bellis NP-C 05/04/2018, 2:14 PM

## 2018-05-04 NOTE — Progress Notes (Signed)
.  Inpatient Rehabilitation Admissions Coordinator  I met with patient and his wife at bedside after patient worked with physical therapy. He now is in agreement for me to pursue an inpt rehab admission readmission with his insurance, [employer].I await OT eval and then will pursue insurance approval.   Danne Baxter, RN, MSN Rehab Admissions Coordinator [phone] 05/04/2018 11:46 AM

## 2018-05-04 NOTE — Progress Notes (Signed)
Patient readmitted as an interrupted stay per CMS guidelines.  Admitted to CIR 04/28/18, discharged back to acute services 05/03/18 and returning 05/04/18.  Interruption of one day 8/19 to 8/20.  Accounts to be combined past discharge.  Please reference [phone] and [phone] as one admission when reviewing record.

## 2018-05-05 ENCOUNTER — Inpatient Hospital Stay (HOSPITAL_COMMUNITY): Payer: PPO | Admitting: Occupational Therapy

## 2018-05-05 ENCOUNTER — Inpatient Hospital Stay (HOSPITAL_COMMUNITY): Payer: PPO | Admitting: Physical Therapy

## 2018-05-05 ENCOUNTER — Inpatient Hospital Stay (HOSPITAL_COMMUNITY): Payer: PPO | Admitting: Speech Pathology

## 2018-05-05 DIAGNOSIS — G8114 Spastic hemiplegia affecting left nondominant side: Secondary | ICD-10-CM

## 2018-05-05 DIAGNOSIS — I2510 Atherosclerotic heart disease of native coronary artery without angina pectoris: Secondary | ICD-10-CM

## 2018-05-05 DIAGNOSIS — I639 Cerebral infarction, unspecified: Secondary | ICD-10-CM

## 2018-05-05 LAB — CBC WITH DIFFERENTIAL/PLATELET
Abs Immature Granulocytes: 0.1 10*3/uL (ref 0.0–0.1)
BASOS ABS: 0.1 10*3/uL (ref 0.0–0.1)
Basophils Relative: 1 %
EOS ABS: 0.3 10*3/uL (ref 0.0–0.7)
EOS PCT: 5 %
HCT: 38.7 % — ABNORMAL LOW (ref 39.0–52.0)
Hemoglobin: 12.5 g/dL — ABNORMAL LOW (ref 13.0–17.0)
IMMATURE GRANULOCYTES: 1 %
LYMPHS ABS: 1.8 10*3/uL (ref 0.7–4.0)
LYMPHS PCT: 26 %
MCH: 28.5 pg (ref 26.0–34.0)
MCHC: 32.3 g/dL (ref 30.0–36.0)
MCV: 88.4 fL (ref 78.0–100.0)
Monocytes Absolute: 0.9 10*3/uL (ref 0.1–1.0)
Monocytes Relative: 13 %
NEUTROS PCT: 54 %
Neutro Abs: 3.8 10*3/uL (ref 1.7–7.7)
Platelets: 246 10*3/uL (ref 150–400)
RBC: 4.38 MIL/uL (ref 4.22–5.81)
RDW: 14.1 % (ref 11.5–15.5)
WBC: 7 10*3/uL (ref 4.0–10.5)

## 2018-05-05 LAB — COMPREHENSIVE METABOLIC PANEL
ALBUMIN: 3.6 g/dL (ref 3.5–5.0)
ALT: 21 U/L (ref 0–44)
ANION GAP: 7 (ref 5–15)
AST: 24 U/L (ref 15–41)
Alkaline Phosphatase: 95 U/L (ref 38–126)
BUN: 17 mg/dL (ref 8–23)
CHLORIDE: 99 mmol/L (ref 98–111)
CO2: 29 mmol/L (ref 22–32)
Calcium: 9.2 mg/dL (ref 8.9–10.3)
Creatinine, Ser: 1.06 mg/dL (ref 0.61–1.24)
GFR calc Af Amer: >60 mL/min (ref >60)
GFR calc non Af Amer: >60 mL/min (ref >60)
GLUCOSE: 92 mg/dL (ref 70–99)
POTASSIUM: 4.4 mmol/L (ref 3.5–5.1)
Sodium: 135 mmol/L (ref 135–145)
Total Bilirubin: 0.5 mg/dL (ref 0.3–1.2)
Total Protein: 6.7 g/dL (ref 6.5–8.1)

## 2018-05-05 NOTE — Progress Notes (Signed)
Occupational Therapy Treatment Note  Patient Details  Name: William Jordan MRN: [id number] Date of Birth: [birth year]   INTERRUPTED STAY  Treatment Note: Treatment session with focus on reassessing safety and independence with ADLs and functional mobility.  Upon arrival, pt reports desire to d/c home ASAP.  Pt reports that he should have gone home instead of returning to CIR.  Pt ambulated around room without AD to gather clothing and items to complete bathing in room shower.  Therapist provided supervision during ambulation and bathing at sit > stand level.  Pt with 1 LOB when turning to rinse back, however able to correct without physical assistance.  Pt completed dressing at sit > stand level even fastening belt buckle and tying shoes.  Noted mild ataxia with LUE when reaching outside BOS to obtain items while showering, improvements noted when visually attending to LUE.  Educated on visual attention to Lt and when reaching for items with LUE to increase motor control and precision.  Pt reports fatigue post shower, returned to bed and left supine in bed with bed alarm on and all needs in reach.    OT Diagnosis: disturbance of vision and muscle weakness (generalized) Rehab Potential: Rehab Potential (ACUTE ONLY): Excellent ELOS: 1-2 days   Problem List:      Patient Active Problem List   Diagnosis Date Noted  . Right middle cerebral artery stroke ([place]) 04/28/2018  . Elevated troponin   . Dehydration   . 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[place])   . Benign essential HTN   . Hyponatremia   . Non-ST elevation (NSTEMI) myocardial infarction ([place])   . Ventricular tachycardia ([place])   . CVA (cerebral vascular accident) ([place]) 04/25/2018  . Opiate overdose ([place]) 12/10/2017  . Acute kidney injury ([place]) 12/10/2017  . Kidney lesion, native, right 12/10/2017  . Abuse, drug or alcohol ([place]) 10/05/2015  . PNA (pneumonia) 10/05/2015  . Bacteremia due to Staphylococcus aureus 10/05/2015  . Chronic pain  syndrome 10/02/2015  . Acute respiratory failure with hypoxia ([place])   . Aspiration pneumonia ([place])   . Hematemesis   . Respiratory failure ([place]) 09/26/2015  . Encephalopathy, unspecified   . Chronic back pain   . Muscle spasm of back   . Altered mental status 08/14/2015  . AKI (acute kidney injury) ([place]) 08/14/2015  . Anemia 08/14/2015  . Altered mental state 08/14/2015  . Pain in left wrist   . Gout attack 06/18/2015  . Septic joint of left wrist ([place]) 06/16/2015  . History of hypertension 06/16/2015  . Leukocytosis 06/16/2015  . Septic joint ([place]) 06/16/2015  . Acute right ankle pain 06/10/2015  . HCAP (healthcare-associated pneumonia) 06/09/2015  . Hypokalemia 06/09/2015  . Acute renal failure syndrome ([place])   . Acute encephalopathy   . Staphylococcus aureus bacteremia with sepsis ([place]) 06/07/2015  . Constipation 06/06/2015  . Toxic metabolic encephalopathy 78/24/2353  . Hyperkalemia 06/05/2015  . Low back pain with sciatica 04/03/2015  . H/O total hip arthroplasty 04/03/2015  . Idiopathic peripheral neuropathy 09/29/2014  . Chronic pain 09/28/2014  . DDD (degenerative disc disease), lumbar 12/30/2012  . Degenerative arthritis of thoracic spine 12/30/2012  . Localized osteoarthrosis 12/13/2012  . Chronic pain associated with significant psychosocial dysfunction 12/13/2012  . Polypharmacy 12/13/2012  . Long term current use of opiate analgesic 12/13/2012    Past Medical History:      Past Medical History:  Diagnosis Date  . Angioedema    from Benazepril reaction  . Anxiety    takes Celexa  .  Arthritis   . Chronic back pain   . Enlarged prostate   . History of blood transfusion   . Hypertension   . Neuropathy    Past Surgical History:       Past Surgical History:  Procedure Laterality Date  . COLONOSCOPY     one polyp removed - benign  . ELBOW SURGERY Right   . HERNIA REPAIR     umbilical  . JOINT REPLACEMENT Left     knee, x 2  . JOINT REPLACEMENT Right    hip  . JOINT REPLACEMENT Left    partial shoulder  . LOOP RECORDER INSERTION N/A 04/27/2018   Procedure: LOOP RECORDER INSERTION;  Surgeon: Evans Lance, MD;  Location: [facility];  Service: Cardiovascular;  Laterality: N/A;  . SPINAL CORD STIMULATOR INSERTION N/A 09/28/2014   Procedure: LUMBAR SPINAL CORD STIMULATOR INSERTION;  Surgeon: Melina Schools, MD;  Location: [facility];  Service: Orthopedics;  Laterality: N/A;  . TEE WITHOUT CARDIOVERSION N/A 04/27/2018   Procedure: TRANSESOPHAGEAL ECHOCARDIOGRAM (TEE);  Surgeon: Larey Dresser, MD;  Location: [facility] ENDOSCOPY;  Service: Cardiovascular;  Laterality: N/A;  . TONSILLECTOMY    . VASECTOMY      Assessment & Plan Clinical Impression:  William Jordan is a 72 year old right-handed male with history of hypertension, idiopathic peripheral neuropathy with spinal cord stimulator 2016 per Dr. Melina Schools and chronic back pain, angioedema and diastolic congestive heart failure. Per chart review patient lives with spouse. Independent driving prior to admission. One level home with 6 steps to entry. Patient with recent discharge from [facility] for angioedema from ACE I.presented 04/25/2018 with left-sided weakness. While in the emergency department he developed tonic-clonic jerking of his left arm. CT the head showed increase conspicuityof multiple small cortical infarctions in the right posterior occipital and parietal lobes. CT angiogram of head and neck showed atherosclerotic changes without significant stenosis. MRI not completed due to nerve stimulator. EEG negative for seizure. Echocardiogram with ejection fraction of 69% and systolic function was normal. Venous Dopplers lower extremities negative for DVT. TEE completed 04/27/2018 with ejection fraction 60%. No thrombus or PFO. Negative bubble study. Underwent loop recorder placement. Latest follow-up cranial CT scan  04/27/2018 showing unchanged appearance of multiple areas of acute disc subacute ischemia in the right parietal and occipital lobes. No acute hemorrhage. Currently maintained on aspirin and Plavix for CVA prophylaxis x3 months then Plavix alone. Subcutaneous heparin for DVT prophylaxis. Patient with intermittent bouts of delirium and confusion that have improved. Maintain on a regular diet. Physical and occupational therapy evaluations completed with recommendations of physical medicine rehab consult. Patient was admitted for a comprehensive rehab program.   Patient transferred to CIR on 04/28/2018 .    Patient currently requires min with basic self-care skills secondary to muscle weakness, decreased attention to left and decreased standing balance.  Prior to hospitalization, patient was fully I, driving, cooking and patient will benefit from skilled intervention to increase independence with basic self-care skills prior to discharge home with care partner.  Anticipate patient will require intermittent supervision and follow up home health.  OT - End of Session Endurance Deficit: No OT Assessment Rehab Potential (ACUTE ONLY): Excellent OT Patient demonstrates impairments in the following area(s): Balance;Perception;Vision OT Basic ADL's Functional Problem(s): Grooming;Bathing;Dressing;Toileting OT Advanced ADL's Functional Problem(s): Simple Meal Preparation OT Transfers Functional Problem(s): Toilet;Tub/Shower OT Additional Impairment(s): None OT Plan OT Intensity: Minimum of 1-2 x/day, 45 to 90 minutes OT Frequency: 5 out of 7  days OT Duration/Estimated Length of Stay: 7-9 days OT Treatment/Interventions: Balance/vestibular training;Discharge planning;Functional mobility training;DME/adaptive equipment instruction;Neuromuscular re-education;Patient/family education;Self Care/advanced ADL retraining;Therapeutic Exercise;Therapeutic Activities;UE/LE Strength taining/ROM;UE/LE Coordination  activities OT Self Feeding Anticipated Outcome(s): I OT Basic Self-Care Anticipated Outcome(s): mod I dressing, supervision showering OT Toileting Anticipated Outcome(s): mod I OT Bathroom Transfers Anticipated Outcome(s): mod I to toilet, S to shower OT Recommendation Patient destination: Home Follow Up Recommendations: Home health OT Equipment Recommended: None recommended by OT    OT Evaluation EVAL FROM 04/29/2018 Precautions/Restrictions  Precautions Precautions: Fall Pain  pt has chronic neuropathic pain in feet and hands  Home Living/Prior Functioning Home Living Family/patient expects to be discharged to:: Private residence Living Arrangements: Spouse/significant other Available Help at Discharge: Family, Available 24 hours/day Type of Home: House Home Access: Stairs to enter [employer] of Steps: 6 Entrance Stairs-Rails: Right, Can reach both, Left Home Layout: One level Bathroom Shower/Tub: Multimedia programmer: Handicapped height Additional Comments: has shower seat  Lives With: Spouse Prior Function Level of Independence: Independent with basic ADLs, Independent with homemaking with ambulation, Independent with gait  Able to Take Stairs?: Yes Driving: Yes Vocation: Retired Comments: drives, cooks ADL ADL ADL Comments: refer to functional navigator Vision Baseline Vision/History: Wears glasses Wears Glasses: At all times Patient Visual Report: No change from baseline(pt reports that his blurred vision is now improved) Vision Assessment?: Yes Eye Alignment: Within Functional Limits Ocular Range of Motion: Other (comment) Alignment/Gaze Preference: Within Defined Limits Tracking/Visual Pursuits: Requires cues, head turns, or add eye shifts to track Saccades: Within functional limits;Additional head turns occurred during testing Visual Fields: No apparent deficits Depth Perception: Overshoots Perception  Perception:  Impaired Inattention/Neglect: Does not attend to left visual field;Does not attend to left side of body(inconsistent inattention) Praxis Praxis: Intact Cognition Overall Cognitive Status: Within Functional Limits for tasks assessed Arousal/Alertness: Awake/alert Orientation Level: Person;Place;Situation Person: Oriented Place: Oriented Situation: Oriented Year: 2019 Month: August Day of Week: Correct Memory: Appears intact Immediate Memory Recall: Sock;Blue;Bed Memory Recall: Sock;Blue;Bed Memory Recall Sock: With Cue Memory Recall Blue: Without Cue Memory Recall Bed: With Cue Attention: Selective Selective Attention: Appears intact Awareness: Appears intact Problem Solving: Appears intact Safety/Judgment: Appears intact Comments: pt admits to going to bathroom by himself but states that he has learned to press call light Sensation Sensation Light Touch: Impaired by gross assessment(in feet) Hot/Cold: Appears Intact Proprioception: Impaired by gross assessment(LUE impaired) Stereognosis: Appears Intact Coordination Gross Motor Movements are Fluid and Coordinated: No Fine Motor Movements are Fluid and Coordinated: Yes(able to tie shoes,) Coordination and Movement Description: slow and delayed on L Finger Nose Finger Test: slower on the L but adequate Motor  Motor Motor: Within Functional Limits Motor - Skilled Clinical Observations: WFL in UEs, pt reports feeling weakness in RLE Mobility    steadying A with transfers and with RW for gait Trunk/Postural Assessment  Cervical Assessment Cervical Assessment: Within Functional Limits Thoracic Assessment Thoracic Assessment: Exceptions to WFL(kyphotic posture) Lumbar Assessment Lumbar Assessment: Within Functional Limits Postural Control Postural Control: Within Functional Limits(pt stands with R knee flexed due to shorter LLE)  Balance Dynamic Sitting Balance Dynamic Sitting - Level of Assistance: 5: Stand by  assistance Static Standing Balance Static Standing - Level of Assistance: 5: Stand by assistance Dynamic Standing Balance Dynamic Standing - Level of Assistance: 4: Min assist Extremity/Trunk Assessment RUE Assessment RUE Assessment: Within Functional Limits LUE Assessment LUE Assessment: Within Functional Limits Passive Range of Motion (PROM) Comments: prior L shoulder surgery with mild  limitations in shoulder flexion   See Function Navigator for Current Functional Status.   Refer to Care Plan for Long Term Goals  Recommendations for other services: None    Discharge Criteria: Patient will be discharged from OT if patient refuses treatment 3 consecutive times without medical reason, if treatment goals not met, if there is a change in medical status, if patient makes no progress towards goals or if patient is discharged from hospital.  The above assessment, treatment plan, treatment alternatives and goals were discussed and mutually agreed upon: by patient and by family   [facility] 05/05/2018, 3:44 PM

## 2018-05-05 NOTE — Progress Notes (Signed)
Subjective/Complaints:   Pt expresses wish to go home ASAP ROS:No CP or SOB, no N/V?D Objective: Vital Signs: Blood pressure (!) 149/89, pulse 73, temperature 98 F (36.7 C), resp. rate 18, height '5\' 10"'  (1.778 m), weight 80.4 kg, SpO2 97 %. No results found. Results for orders placed or performed during the hospital encounter of 05/04/18 (from the past 72 hour(s))  CBC WITH DIFFERENTIAL     Status: Abnormal   Collection Time: 05/05/18  5:51 AM  Result Value Ref Range   WBC 7.0 4.0 - 10.5 K/uL   RBC 4.38 4.22 - 5.81 MIL/uL   Hemoglobin 12.5 (L) 13.0 - 17.0 g/dL   HCT 38.7 (L) 39.0 - 52.0 %   MCV 88.4 78.0 - 100.0 fL   MCH 28.5 26.0 - 34.0 pg   MCHC 32.3 30.0 - 36.0 g/dL   RDW 14.1 11.5 - 15.5 %   Platelets 246 150 - 400 K/uL   Neutrophils Relative % 54 %   Neutro Abs 3.8 1.7 - 7.7 K/uL   Lymphocytes Relative 26 %   Lymphs Abs 1.8 0.7 - 4.0 K/uL   Monocytes Relative 13 %   Monocytes Absolute 0.9 0.1 - 1.0 K/uL   Eosinophils Relative 5 %   Eosinophils Absolute 0.3 0.0 - 0.7 K/uL   Basophils Relative 1 %   Basophils Absolute 0.1 0.0 - 0.1 K/uL   Immature Granulocytes 1 %   Abs Immature Granulocytes 0.1 0.0 - 0.1 K/uL    Comment: Performed at [facility], [address], [place], [place] [address]  Comprehensive metabolic panel     Status: None   Collection Time: 05/05/18  5:51 AM  Result Value Ref Range   Sodium 135 135 - 145 mmol/L   Potassium 4.4 3.5 - 5.1 mmol/L   Chloride 99 98 - 111 mmol/L   CO2 29 22 - 32 mmol/L   Glucose, Bld 92 70 - 99 mg/dL   BUN 17 8 - 23 mg/dL   Creatinine, Ser 1.06 0.61 - 1.24 mg/dL   Calcium 9.2 8.9 - 10.3 mg/dL   Total Protein 6.7 6.5 - 8.1 g/dL   Albumin 3.6 3.5 - 5.0 g/dL   AST 24 15 - 41 U/L   ALT 21 0 - 44 U/L   Alkaline Phosphatase 95 38 - 126 U/L   Total Bilirubin 0.5 0.3 - 1.2 mg/dL   GFR calc non Af Amer >60 >60 mL/min   GFR calc Af Amer >60 >60 mL/min    Comment: (NOTE) The eGFR has been calculated using the CKD EPI  equation. This calculation has not been validated in all clinical situations. eGFR's persistently <60 mL/min signify possible Chronic Kidney Disease.    Anion gap 7 5 - 15    Comment: Performed at [facility] [address], [place], [place] [address]     HEENT: normal Cardio: RRR and no murmur Resp: CTA B/L and Unlabored GI: BS positive and NT, ND Extremity:  Pulses positive and No Edema Skin:   Intact Neuro: Alert/Oriented and Abnormal Motor 4/5 in LUE adn LLE, 5/5 on the RIght side Musc/Skel:  Other no pain with UE or LE ROM Gen NAD   Assessment/Plan: 1. Functional deficits secondary to Right parieto occipital infarct which require 3+ hours per day of interdisciplinary therapy in a comprehensive inpatient rehab setting. Physiatrist is providing close team supervision and 24 hour management of active medical problems listed below. Physiatrist and rehab team continue to assess  barriers to discharge/monitor patient progress toward functional and medical goals. FIM:             Function - Chair/bed transfer Chair/bed transfer method: Ambulatory Chair/bed transfer assist level: Supervision or verbal cues Chair/bed transfer assistive device: Armrests Chair/bed transfer details: Verbal cues for precautions/safety  Function - Locomotion: Wheelchair Will patient use wheelchair at discharge?: No Type: Manual Wheelchair activity did not occur: N/A(ambulatory ) Wheel 50 feet with 2 turns activity did not occur: N/A(ambulatory ) Wheel 150 feet activity did not occur: N/A(ambulatory ) Turns around,maneuvers to table,bed, and toilet,negotiates 3% grade,maneuvers on rugs and over doorsills: No Function - Locomotion: Ambulation Assistive device: No device Max distance: 150 Assist level: Touching or steadying assistance (Pt > 75%) Assist level: Touching or steadying assistance (Pt > 75%) Assist level: Touching or steadying assistance (Pt > 75%) Assist level: Touching or  steadying assistance (Pt > 75%) Assist level: Touching or steadying assistance (Pt > 75%)  Function - Comprehension Comprehension: Auditory Comprehension assist level: Follows complex conversation/direction with no assist  Function - Expression Expression: Verbal Expression assist level: Expresses complex ideas: With no assist  Function - Social Interaction Social Interaction assist level: Interacts appropriately with others - No medications needed.  Function - Problem Solving Problem solving assist level: Solves complex problems: Recognizes & self-corrects  Function - Memory Memory assist level: Complete Independence: No helper Patient normally able to recall (first 3 days only): Current season, Location of own room, That he or she is in a hospital, Staff names and faces  Medical Problem List and Plan: 1.Left-sided weaknesssecondary to right parieto-occipital lobe infarction. Status post loop recorder. Pt is being readmitted for an interrupted stay after STEMI. Resume prior functional plan, discharge goals remain attainable. PT,OT, SLP re evals 2. DVT Prophylaxis/Anticoagulation: SCDs ambulating > 4f 3. Pain Management:Neurontin 300 mg 3 times daily, oxycodone as needed 4. Mood:Provide emotional support.Rozerem8 mg daily 5. Neuropsych: This patientiscapable of making decisions on hisown behalf. 6. Skin/Wound Care:Routine skin checks 7. Fluids/Electrolytes/Nutrition:Routine in and outs , BMET normal on 8/21 8.Non-STEMI. Status post cardiac catheterization. Continue aspirin. Follow cardiology services.  Monitor for activity tolerance with therapies 9.Diastolic congestive heart failure. Monitor for any signs of fluid overload -I: [id number].Angioedema. [facility]. Continue to monitor 11.Hyperlipidemia. Lipitor LOS (Days) 1 A FACE TO FACE EVALUATION  WAS PERFORMED  AnCharlett Blake/21/2019, 1:16 PM

## 2018-05-05 NOTE — Progress Notes (Signed)
Physical Therapy Session Note (following interruption of care)  Patient Details  Name: William Jordan MRN: [id number] Date of Birth: [birth year]  Today's Date: 05/05/2018 PT Individual Time: 0802-0900 PT Individual Time Calculation (min): 58 min         Patient Active Problem List   Diagnosis Date Noted  . Right middle cerebral artery stroke ([place]) 04/28/2018  . Elevated troponin   . Dehydration   . 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[place])   . Benign essential HTN   . Hyponatremia   . Non-ST elevation (NSTEMI) myocardial infarction ([place])   . Ventricular tachycardia ([place])   . CVA (cerebral vascular accident) ([place]) 04/25/2018  . Opiate overdose ([place]) 12/10/2017  . Acute kidney injury ([place]) 12/10/2017  . Kidney lesion, native, right 12/10/2017  . Abuse, drug or alcohol ([place]) 10/05/2015  . PNA (pneumonia) 10/05/2015  . Bacteremia due to Staphylococcus aureus 10/05/2015  . Chronic pain syndrome 10/02/2015  . Acute respiratory failure with hypoxia ([place])   . Aspiration pneumonia ([place])   . Hematemesis   . Respiratory failure ([place]) 09/26/2015  . Encephalopathy, unspecified   . Chronic back pain   . Muscle spasm of back   . Altered mental status 08/14/2015  . AKI (acute kidney injury) ([place]) 08/14/2015  . Anemia 08/14/2015  . Altered mental state 08/14/2015  . Pain in left wrist   . Gout attack 06/18/2015  . Septic joint of left wrist ([place]) 06/16/2015  . History of hypertension 06/16/2015  . Leukocytosis 06/16/2015  . Septic joint ([place]) 06/16/2015  . Acute right ankle pain 06/10/2015  . HCAP (healthcare-associated pneumonia) 06/09/2015  . Hypokalemia 06/09/2015  . Acute renal failure syndrome ([place])   . Acute encephalopathy   . Staphylococcus aureus bacteremia with sepsis ([place]) 06/07/2015  . Constipation 06/06/2015  . Toxic metabolic encephalopathy 62/26/3335  . Hyperkalemia 06/05/2015  . Low back pain with sciatica 04/03/2015  . H/O total hip arthroplasty 04/03/2015  . Idiopathic peripheral  neuropathy 09/29/2014  . Chronic pain 09/28/2014  . DDD (degenerative disc disease), lumbar 12/30/2012  . Degenerative arthritis of thoracic spine 12/30/2012  . Localized osteoarthrosis 12/13/2012  . Chronic pain associated with significant psychosocial dysfunction 12/13/2012  . Polypharmacy 12/13/2012  . Long term current use of opiate analgesic 12/13/2012   Past Medical History:      Past Medical History:  Diagnosis Date  . Angioedema    from Benazepril reaction  . Anxiety    takes Celexa  . Arthritis   . Chronic back pain   . Enlarged prostate   . History of blood transfusion   . Hypertension   . Neuropathy    Past Surgical History:       Past Surgical History:  Procedure Laterality Date  . COLONOSCOPY     one polyp removed - benign  . ELBOW SURGERY Right   . HERNIA REPAIR     umbilical  . JOINT REPLACEMENT Left    knee, x 2  . JOINT REPLACEMENT Right    hip  . JOINT REPLACEMENT Left    partial shoulder  . LOOP RECORDER INSERTION N/A 04/27/2018   Procedure: LOOP RECORDER INSERTION; Surgeon: Evans Lance, MD; Location: [facility]; Service: Cardiovascular; Laterality: N/A;  . SPINAL CORD STIMULATOR INSERTION N/A 09/28/2014   Procedure: LUMBAR SPINAL CORD STIMULATOR INSERTION; Surgeon: Melina Schools, MD; Location: [facility]; Service: Orthopedics; Laterality: N/A;  . TEE WITHOUT CARDIOVERSION N/A 04/27/2018   Procedure: TRANSESOPHAGEAL ECHOCARDIOGRAM (TEE); Surgeon: Larey Dresser, MD; Location[facility]  ENDOSCOPY; Service: Cardiovascular; Laterality: N/A;  . TONSILLECTOMY    . VASECTOMY     Assessment & Plan  Clinical Impression:William Jordan is a 72 year old right-handed male with history of hypertension, idiopathic peripheral neuropathy with spinal cord stimulator 2016 per Dr. Melina Schools and chronic back pain, angioedema and diastolic congestive heart failure. Per chart review patient lives with spouse. Independent driving prior to admission. One level home with 6  steps to entry. Patient with recent discharge from [facility] for angioedema from ACE I. presented 04/25/2018 with left-sided weakness. While in the emergency department he developed tonic-clonic jerking of his left arm. CT the head showed increase conspicuity of multiple small cortical infarctions in the right posterior occipital and parietal lobes. CT angiogram of head and neck showed atherosclerotic changes without significant stenosis. MRI not completed due to nerve stimulator. EEG negative for seizure. Echocardiogram with ejection fraction of 86% and systolic function was normal. Venous Dopplers lower extremities negative for DVT. TEE completed 04/27/2018 with ejection fraction 60%. No thrombus or PFO. Negative bubble study. Underwent loop recorder placement. Latest follow-up cranial CT scan 04/27/2018 showing unchanged appearance of multiple areas of acute disc subacute ischemia in the right parietal and occipital lobes. No acute hemorrhage. Patient transferred to CIR on 04/28/2018 .  Patient currently requires min with mobility secondary to leg length dispcrepancy (per pt), impaired timing and sequencing and decreased standing balance, hemiplegia and decreased balance strategies. Prior to hospitalization, patient was independent with mobility and lived with Spouse in a House home. Home access is 6; also 2 STE L rail at front doorStairs to enter.  Patient will benefit from skilled PT intervention to maximize safe functional mobility, minimize fall risk and decrease caregiver burden for planned discharge home with intermittent assist. Anticipate patient will benefit from follow up OP at discharge.  PT - End of Session  Endurance Deficit: No  Endurance Deficit Description: pt fatigued after gait x 150'  PT Assessment  Rehab Potential (ACUTE/IP ONLY): Good  PT Patient demonstrates impairments in the following area(s): Balance;Pain;Motor;Sensory  PT Transfers Functional Problem(s): Bed Mobility;Bed  to Chair;Car;Furniture;Floor  PT Locomotion Functional Problem(s): Ambulation;Wheelchair Mobility;Stairs  PT Plan  PT Intensity: Minimum of 1-2 x/day ,45 to 90 minutes  PT Frequency: 5 out of 7 days  PT Duration Estimated Length of Stay: 1-2 (planned DC on 8/23) PT Treatment/Interventions: Ambulation/gait training;Community reintegration;DME/adaptive equipment instruction;Neuromuscular re-education;Psychosocial support;Stair training;UE/LE Strength taining/ROM;Wheelchair propulsion/positioning;Balance/vestibular training;Discharge planning;Pain management;Therapeutic Activities;UE/LE Coordination activities;Functional mobility training;Patient/family education;Splinting/orthotics;Therapeutic Exercise;Visual/perceptual remediation/compensation  PT Transfers Anticipated Outcome(s): modified independent basic, supervision car and floor  PT Locomotion Anticipated Outcome(s): supervision gait controlled and community env x 250' and up/down 12 steps; modified independent gait x 50' home env  PT Recommendation  Follow Up Recommendations: Outpatient PT  Patient destination: Home  Equipment Recommended: To be determined  Skilled Therapeutic Intervention  Wife present for eval. She recently fx'd her wrist, is still wearing a splint, and has not started PT yet.  Patient received in bed have returned from acute care and ready for resumption of skilled PT services.  He is pleasant but reporting high levels of fatigue due to not sleeping well last night. He is able to complete all functional bed mobility with Mod(I), and requires only S to Min guard for all functional mobility and gait with no device performed this session, was somewhat limited by pre-existing R foot pain resultant from old fracture. There does not appear to be significant functional change following acute change in medical status  and PT POC will continue as previously indicated. He was left in bed with all needs met, bed alarm activated this  morning.   PT Evaluation (follow interruption in care due to acute medical event) Precautions/Restrictions  Precautions  Precautions: Fall  Restrictions  Weight Bearing Restrictions: No  Pain  Pain Assessment  Pain Scale: 0-10  Pain Score: 5/10  Faces Pain Scale: Hurts worst  Pain Type: Neuropathic pain  Pain Location: Foot  Pain Orientation: Right;Left  Pain Descriptors / Indicators: Aching;Burning;Constant;Pins and needles;Throbbing  Pain Onset: Progressive  Patients Stated Pain Goal: 2  Pain Intervention(s): Medication (See eMAR);Distraction;Prayer;Elevated extremity;Massage;Repositioned  Home Living/Prior Functioning  Home Living  Available Help at Discharge: Family;Available 24 hours/day  Type of Home: House  Home Access: Stairs to enter  [employer] of Steps: 6; also 2 STE L rail at front door  Entrance Stairs-Rails: Right;Can reach both;Left  Home Layout: One level  Bathroom Shower/Tub: Tourist information centre manager: Handicapped height  Additional Comments: has shower seat  Lives With: Spouse  Prior Function  Level of Independence: Independent with basic ADLs;Independent with homemaking with ambulation;Independent with gait  Able to Take Stairs?: Yes  Driving: Yes  Vocation: Psychologist, educational)  Comments: drives, cooks  Vision/Perception  Vision - Assessment  Eye Alignment: Within Functional Limits  Ocular Range of Motion: Other (comment)  Alignment/Gaze Preference: Within Defined Limits  Tracking/Visual Pursuits: Other (comment)(slight difficulty keeping head still)  Saccades: Within functional limits;Additional head turns occurred during testing  Perception  Perception: Impaired  Inattention/Neglect: Does not attend to left visual field;Does not attend to left side of body(inconsistent inattention)  Praxis  Praxis: Intact  Cognition  Overall Cognitive Status: Within Functional Limits for tasks assessed  Arousal/Alertness: Awake/alert   Orientation Level: Oriented X4  Memory: Appears intact  Sensation  Sensation  Light Touch: Impaired by gross assessment(bil feet; parasthesias R> L)  Hot/Cold: Appears Intact  Proprioception: Impaired by gross assessment(L great toe kinesthesia intact, but unable to detect position 5/5 trials)  Stereognosis: Appears Intact  Coordination  Gross Motor Movements are Fluid and Coordinated: No  Fine Motor Movements are Fluid and Coordinated: No  Coordination and Movement Description: slow and delayed on L  Finger Nose Finger Test: slower on the L but adequate  Heel Shin Test: = bil  Motor  Motor  Motor: Within Functional Limits  Motor - Skilled Clinical Observations: WFL in UEs, pt reports feeling weakness in RLE  Mobility  Bed Mobility  Bed Mobility: Supine to Sit  Supine to Sit: Mod(I) Transfers  Transfers: Stand Pivot Transfers  Stand Pivot Transfers: Contact Guard/Touching assist  Transfer (Assistive device): None  Locomotion  Gait  Ambulation: Yes  Gait Assistance: steadying assist   Gait Distance (Feet): 150 Feet  Assistive device: None  Gait Assistance Details: Manual facilitation for weight shifting  Gait  Gait: Yes  Gait Pattern: Impaired  When taking 1st step with R foot, pt lurches slightly; he reports this is habitual due to peripheral neuropathy  Gait Pattern: Step-through pattern;Right flexed knee in stance;Decreased trunk rotation;Wide base of support  Stairs / Additional Locomotion  Stairs: Yes  Stairs Assistance: Contact Guard/Touching assist; cues for placing entire L foot on tread  Stair Management Technique: Two rails  Number of Stairs: 12  Height of Stairs: 6(and 3")  Wheelchair Mobility  Wheelchair Mobility: Yes  Wheelchair Assistance: max Theme park manager: Both upper extermities  Wheelchair Parts Management: Needs assistance  Distance: 8  Trunk/Postural Assessment  Cervical Assessment  Cervical Assessment: Within Functional Limits   Thoracic Assessment  Thoracic Assessment: Exceptions to WFL(kyphotic posture)  Lumbar Assessment  Lumbar Assessment: Within Functional Limits  Postural Control  Postural Control: Within Functional Limits(pt stands with R knee flexed due to shorter LLE)  Balance  Balance  Balance Assessed: Yes  Static Sitting Balance  Static Sitting - Level of Assistance: 7: Independent  Dynamic Sitting Balance  Dynamic Sitting - Level of Assistance: 5: Stand by assistance  Static Standing Balance  Static Standing - Level of Assistance: 5: Stand by assistance  Dynamic Standing Balance  Dynamic Standing - Level of Assistance: 4: Min assist(given external perturbations in all directions, pt demonstrated = bil, delayed ankle strategy, absent hip strategy, L stepping strategy)  Extremity Assessment  RUE Assessment  RUE Assessment: Within Functional Limits  LUE Assessment  LUE Assessment: Within Functional Limits  Passive Range of Motion (PROM) Comments: prior L shoulder surgery with mild limitations in shoulder flexion  RLE Assessment  RLE Assessment: Within Functional Limits  R knee flexed in stance due to LL discrepancy, R> LLE  LLE Assessment  LLE Assessment: Exceptions to WFL(L mid-foot bony deformity due to childhool injury)  General Strength Comments: grossly in sitting: 4/5 hip flex, hip abduciton, hip adduction, knee extension, ankle DF  See Function Navigator for Current Functional Status.  Refer to Care Plan for Long Term Goals  Recommendations for other services: None  Discharge Criteria: Patient will be discharged from PT if patient refuses treatment 3 consecutive times without medical reason, if treatment goals not met, if there is a change in medical status, if patient makes no progress towards goals or if patient is discharged from hospital.  The above assessment, treatment plan, treatment alternatives and goals were discussed and mutually agreed upon: by patient and by  family    Therapy/Group: Individual Therapy  Deniece Ree PT, DPT, CBIS  Supplemental Physical Therapist [facility]   Pager [phone]   05/05/2018, 12:38 PM

## 2018-05-05 NOTE — Patient Care Conference (Signed)
Inpatient RehabilitationTeam Conference and Plan of Care Update Date: 05/05/2018   Time: 10:35 AM    Patient Name: William Jordan      Medical Record Number: [id number]  Date of Birth: [birth year] Sex: Male         Room/Bed: 4W23C/4W23C-01 Payor Info: Payor: [employer] / Plan: [employer] Must / Product Type: *No Product type* /    Admitting Diagnosis: CVA Chest pains  Admit Date/Time:  05/04/2018  5:48 PM Admission Comments: No comment available   Primary Diagnosis:  <principal problem not specified> Principal Problem: <principal problem not specified>  Patient Active Problem List   Diagnosis Date Noted  . Parietal lobe infarction ([place]) 05/04/2018  . Chest pain 05/03/2018  . Spastic hemiparesis of left nondominant side due to acute cerebral infarction ([place])   . Postinfarction angina ([place])   . Right middle cerebral artery stroke ([place]) 04/28/2018  . Elevated troponin   . Dehydration   . 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[place])   . Benign essential HTN   . Hyponatremia   . Non-ST elevation (NSTEMI) myocardial infarction ([place])   . Ventricular tachycardia ([place])   . CVA (cerebral vascular accident) ([place]) 04/25/2018  . Opiate overdose ([place]) 12/10/2017  . Acute kidney injury ([place]) 12/10/2017  . Kidney lesion, native, right 12/10/2017  . Abuse, drug or alcohol ([place]) 10/05/2015  . PNA (pneumonia) 10/05/2015  . Bacteremia due to Staphylococcus aureus 10/05/2015  . Chronic pain syndrome 10/02/2015  . Acute respiratory failure with hypoxia ([place])   . Aspiration pneumonia ([place])   . Hematemesis   . Respiratory failure ([place]) 09/26/2015  . Encephalopathy, unspecified   . Chronic back pain   . Muscle spasm of back   . Altered mental status 08/14/2015  . AKI (acute kidney injury) ([place]) 08/14/2015  . Anemia 08/14/2015  . Altered mental state 08/14/2015  . Pain in left wrist   . Gout attack 06/18/2015  . Septic joint of left wrist ([place]) 06/16/2015  . History of hypertension 06/16/2015  .  Leukocytosis 06/16/2015  . Septic joint ([place]) 06/16/2015  . Acute right ankle pain 06/10/2015  . HCAP (healthcare-associated pneumonia) 06/09/2015  . Hypokalemia 06/09/2015  . Acute renal failure syndrome ([place])   . Acute encephalopathy   . Staphylococcus aureus bacteremia with sepsis ([place]) 06/07/2015  . Constipation 06/06/2015  . Toxic metabolic encephalopathy 01/75/1025  . Hyperkalemia 06/05/2015  . Low back pain with sciatica 04/03/2015  . H/O total hip arthroplasty 04/03/2015  . Idiopathic peripheral neuropathy 09/29/2014  . Chronic pain 09/28/2014  . DDD (degenerative disc disease), lumbar 12/30/2012  . Degenerative arthritis of thoracic spine 12/30/2012  . Localized osteoarthrosis 12/13/2012  . Chronic pain associated with significant psychosocial dysfunction 12/13/2012  . Polypharmacy 12/13/2012  . Long term current use of opiate analgesic 12/13/2012    Expected Discharge Date: Expected Discharge Date: 05/07/18  Team Members Present: Physician leading conference: Dr. Alysia Penna Social Worker Present: Ovidio Kin, LCSW Nurse Present: Junius Creamer, RN PT Present: Lavone Nian, PT OT Present: Simonne Come, OT SLP Present: Stormy Fabian, SLP PPS Coordinator present : Daiva Nakayama, RN, CRRN     Current Status/Progress Goal Weekly Team Focus  Medical   Patient had coronary angiography demonstrating nonocclusive coronary artery disease medical management recommended  Remain angina free during rehab stay  Discharge training   Bowel/Bladder   Continent of bowel and bladder: lbm 05/01/2018; pt will be given sorbitol at 6am  Remain continent of bowel and bladder  Assess bowel and bladder function  q shift and PRN    Swallow/Nutrition/ Hydration             ADL's   supervision bathing/dressing  Mod I, supervision shower transfer and bathing  dynamic balance, LUE NMR, Lt attention   Mobility   Mod(I)/extended time for bed mobility, S for transfers, min guard gait in  hallway no device, min guard stairs with 2 raiilngs; no significant change in mobility since last session   Mod(I) to S   gait/balance/strength   Communication             Safety/Cognition/ Behavioral Observations  Mod I - at baseline  N/A  cognitive evaluation completed, pt at baseline, wife at home already helped with medication management etc   Pain   Constant complaints of pain at 10/10 treated with scheduled pain meds and PRN oxycodone q4h   Pain </= 4/10  Assess pain q shift and PRN and treat as needed   Skin   No skin issues at this time  Remain free of infection and breakdown  Assess skin q shift and PRN      *See Care Plan and progress notes for long and short-term goals.     Barriers to Discharge  Current Status/Progress Possible Resolutions Date Resolved   Physician    Medical stability     Re-eval's with PT OT  Likely short stay      Nursing  Medication compliance;Medical stability               PT                    OT                  SLP                SW                Discharge Planning/Teaching Needs:  Admitted yesterday after meidcal procedure-home with wife who can provide assistance. Await team re-eval      Team Discussion:  Back on CIR from cardiac cath procedure interrupted stay. Want sot go home ASAP. Medically more stable now cath completed. No speech follow-cognitively fine. PT & OT re-eval today.  Revisions to Treatment Plan:  Tentative discharge 8/23    Continued Need for Acute Rehabilitation Level of Care: The patient requires daily medical management by a physician with specialized training in physical medicine and rehabilitation for the following conditions: Daily direction of a multidisciplinary physical rehabilitation program to ensure safe treatment while eliciting the highest outcome that is of practical value to the patient.: Yes Daily medical management of patient stability for increased activity during participation in an intensive  rehabilitation regime.: Yes Daily analysis of laboratory values and/or radiology reports with any subsequent need for medication adjustment of medical intervention for : Neurological problems;Cardiac problems  Florrie Ramires, Gardiner Rhyme 05/05/2018, 4:18 PM

## 2018-05-05 NOTE — Evaluation (Signed)
Speech Language Pathology Assessment and Plan  Patient Details  Name: William Jordan MRN: [id number] Date of Birth: 03/21/1946   Today's Date: 05/05/2018 SLP Individual Time: 7209-4709 SLP Individual Time Calculation (min): 45 min   Problem List:  Patient Active Problem List   Diagnosis Date Noted  . Parietal lobe infarction ([place]) 05/04/2018  . Chest pain 05/03/2018  . Spastic hemiparesis of left nondominant side due to acute cerebral infarction ([place])   . Postinfarction angina ([place])   . Right middle cerebral artery stroke ([place]) 04/28/2018  . Elevated troponin   . Dehydration   . 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[place])   . Benign essential HTN   . Hyponatremia   . Non-ST elevation (NSTEMI) myocardial infarction ([place])   . Ventricular tachycardia ([place])   . CVA (cerebral vascular accident) ([place]) 04/25/2018  . Opiate overdose ([place]) 12/10/2017  . Acute kidney injury ([place]) 12/10/2017  . Kidney lesion, native, right 12/10/2017  . Abuse, drug or alcohol ([place]) 10/05/2015  . PNA (pneumonia) 10/05/2015  . Bacteremia due to Staphylococcus aureus 10/05/2015  . Chronic pain syndrome 10/02/2015  . Acute respiratory failure with hypoxia ([place])   . Aspiration pneumonia ([place])   . Hematemesis   . Respiratory failure ([place]) 09/26/2015  . Encephalopathy, unspecified   . Chronic back pain   . Muscle spasm of back   . Altered mental status 08/14/2015  . AKI (acute kidney injury) ([place]) 08/14/2015  . Anemia 08/14/2015  . Altered mental state 08/14/2015  . Pain in left wrist   . Gout attack 06/18/2015  . Septic joint of left wrist ([place]) 06/16/2015  . History of hypertension 06/16/2015  . Leukocytosis 06/16/2015  . Septic joint ([place]) 06/16/2015  . Acute right ankle pain 06/10/2015  . HCAP (healthcare-associated pneumonia) 06/09/2015  . Hypokalemia 06/09/2015  . Acute renal failure syndrome ([place])   . Acute encephalopathy   . Staphylococcus aureus bacteremia with sepsis ([place]) 06/07/2015  . Constipation  06/06/2015  . Toxic metabolic encephalopathy 62/83/6629  . Hyperkalemia 06/05/2015  . Low back pain with sciatica 04/03/2015  . H/O total hip arthroplasty 04/03/2015  . Idiopathic peripheral neuropathy 09/29/2014  . Chronic pain 09/28/2014  . DDD (degenerative disc disease), lumbar 12/30/2012  . Degenerative arthritis of thoracic spine 12/30/2012  . Localized osteoarthrosis 12/13/2012  . Chronic pain associated with significant psychosocial dysfunction 12/13/2012  . Polypharmacy 12/13/2012  . Long term current use of opiate analgesic 12/13/2012   Past Medical History:  Past Medical History:  Diagnosis Date  . Angioedema    from Benazepril reaction  . Anxiety    takes Celexa  . Arthritis   . Chronic back pain   . CVA (cerebral vascular accident) ([place])   . Enlarged prostate   . History of blood transfusion   . Hypertension   . Neuropathy    Past Surgical History:  Past Surgical History:  Procedure Laterality Date  . CARDIAC CATHETERIZATION  05/03/2018  . COLONOSCOPY     one polyp removed - benign  . ELBOW SURGERY Right   . HERNIA REPAIR     umbilical  . JOINT REPLACEMENT Left    knee, x 2  . JOINT REPLACEMENT Right    hip  . JOINT REPLACEMENT Left    partial shoulder  . LEFT HEART CATH AND CORONARY ANGIOGRAPHY N/A 05/03/2018   Procedure: LEFT HEART CATH AND CORONARY ANGIOGRAPHY;  Surgeon: Martinique, Peter M, MD;  Location: [facility];  Service: Cardiovascular;  Laterality: N/A;  . LOOP  RECORDER INSERTION N/A 04/27/2018   Procedure: LOOP RECORDER INSERTION;  Surgeon: Evans Lance, MD;  Location: [facility];  Service: Cardiovascular;  Laterality: N/A;  . SPINAL CORD STIMULATOR INSERTION N/A 09/28/2014   Procedure: LUMBAR SPINAL CORD STIMULATOR INSERTION;  Surgeon: Melina Schools, MD;  Location: [facility];  Service: Orthopedics;  Laterality: N/A;  . TEE WITHOUT CARDIOVERSION N/A 04/27/2018   Procedure: TRANSESOPHAGEAL ECHOCARDIOGRAM (TEE);  Surgeon: Larey Dresser, MD;  Location: [facility];  Service: Cardiovascular;  Laterality: N/A;  . TONSILLECTOMY    . VASECTOMY      Assessment / Plan / Recommendation Clinical Impression Pt was patient on Inpatient rehab and recently completed comprehensive cognitive linguisitic evaluation with functional abilities on 04/29/18. Pt was discharged from inpatient rehab for 1 day to have heart catherization. Upon return to inpatient rehab, ST received orders for cognitive linguistic evaluation. Evaluation completed with no acute changes identified. Pt continues with baseline verbosity that can disrtact him from activities at hand but pt and wife are aware and pt attempts to monitor however difficult as this is his personality. No further skilled ST is indicated at this time. Discussed with interdisciplinary team and all are in agreement.     Skilled Therapeutic Interventions          Skilled treatment session focused on completion of above mentioned evaluation. No acute deficits identified. Education completed.    SLP Assessment  Patient does not need any further Speech [place] Pathology Services    Recommendations  Patient destination: Home Follow up Recommendations: None Equipment Recommended: None recommended by SLP    SLP Frequency     SLP Duration  SLP Intensity  SLP Treatment/Interventions            Pain Pain Assessment Pain Scale: 0-10 Pain Score: 5  Pain Type: Chronic pain Pain Location: Generalized Pain Descriptors / Indicators: Aching Pain Frequency: Constant Pain Onset: On-going Patients Stated Pain Goal: 3 Pain Intervention(s): Medication (See eMAR)  Prior Functioning Cognitive/Linguistic Baseline: Within functional limits Type of Home: House  Lives With: Spouse Available Help at Discharge: Family;Available 24 hours/day Vocation: Retired  Function:    Cognition Comprehension Comprehension assist level: Follows complex conversation/direction with no assist  Expression    Expression assist level: Expresses complex ideas: With no assist  Social Interaction Social Interaction assist level: Interacts appropriately with others - No medications needed.  Problem Solving Problem solving assist level: Solves complex problems: Recognizes & self-corrects  Memory Memory assist level: Complete Independence: No helper   Short Term Goals: No short term goals set  Refer to Care Plan for Long Term Goals  Recommendations for other services: None   Discharge Criteria: Patient will be discharged from SLP if patient refuses treatment 3 consecutive times without medical reason, if treatment goals not met, if there is a change in medical status, if patient makes no progress towards goals or if patient is discharged from hospital.  The above assessment, treatment plan, treatment alternatives and goals were discussed and mutually agreed upon: by patient  Wladyslawa Disbro 05/05/2018, 2:27 PM

## 2018-05-05 NOTE — Progress Notes (Addendum)
Physical Therapy Session Note  Patient Details  Name: William Jordan MRN: [id number] Date of Birth: Nov 21, 1945  Today's Date: 05/05/2018 PT Individual Time: 1502-1526 PT Individual Time Calculation (min): 24 min   Short Term Goals: Week 1:  PT Short Term Goal 1 (Week 1): = LTGs due to ELOS  Skilled Therapeutic Interventions/Progress Updates:  Pt received sitting on EOB reporting he is "very very tired" and aching all over but agreeable to tx. RN notified of pt's c/o pain & administered meds during session. Pt donned/doffed shoes sitting EOB with mod I. Pt ambulates room<>dayroom without AD & min assist with decreased weight shifting to R and pt reporting RLE longer due to 72 y/o injury. Pt requires min assist due to fatigue & impaired balance during gait. Back in room pt completed 10x sit<>stand without BUE support for BLE strengthening. Educated pt on anticipated d/c date and recommendation of supervision with pt reporting his wife will return to work after picking him up Friday & therapist reiterated recommendation of supervision at d/c. At end of session pt left in bed with alarm set & all needs within reach.  Addendum: BP at beginning of session = 132/86 mmHg, HR = 102 bpm  Therapy Documentation Precautions:  Restrictions Weight Bearing Restrictions: No   See Function Navigator for Current Functional Status.   Therapy/Group: Individual Therapy  [name] 05/05/2018, 3:50 PM

## 2018-05-06 ENCOUNTER — Inpatient Hospital Stay (HOSPITAL_COMMUNITY): Payer: PPO | Admitting: Physical Therapy

## 2018-05-06 ENCOUNTER — Inpatient Hospital Stay (HOSPITAL_COMMUNITY): Payer: PPO | Admitting: Occupational Therapy

## 2018-05-06 MED ORDER — GABAPENTIN 600 MG PO TABS
600.0000 mg | ORAL_TABLET | Freq: Three times a day (TID) | ORAL | Status: DC
  Administered 2018-05-06 – 2018-05-07 (×3): 600 mg via ORAL
  Filled 2018-05-06 (×3): qty 1

## 2018-05-06 MED ORDER — GABAPENTIN 600 MG PO TABS: 300.0000 mg | ORAL_TABLET | Freq: Three times a day (TID) | ORAL | 0 refills | Status: DC

## 2018-05-06 MED ORDER — RAMELTEON 8 MG PO TABS: 8.0000 mg | ORAL_TABLET | Freq: Every day | ORAL | 0 refills | Status: DC

## 2018-05-06 MED ORDER — ATORVASTATIN CALCIUM 40 MG PO TABS: 40.0000 mg | ORAL_TABLET | Freq: Every day | ORAL | 3 refills | Status: AC

## 2018-05-06 MED ORDER — ACETAMINOPHEN 325 MG PO TABS: 650.0000 mg | ORAL_TABLET | ORAL | Status: AC | PRN

## 2018-05-06 MED ORDER — CLOPIDOGREL BISULFATE 75 MG PO TABS: 75.0000 mg | ORAL_TABLET | Freq: Every day | ORAL | 3 refills | Status: AC

## 2018-05-06 MED ORDER — PANTOPRAZOLE SODIUM 40 MG PO TBEC: 40.0000 mg | DELAYED_RELEASE_TABLET | Freq: Every day | ORAL | 0 refills | Status: DC

## 2018-05-06 MED ORDER — ALFUZOSIN HCL ER 10 MG PO TB24: 10.0000 mg | ORAL_TABLET | Freq: Every day | ORAL | 0 refills | Status: DC

## 2018-05-06 NOTE — Discharge Instructions (Signed)
Inpatient Rehab Discharge Instructions  William Jordan Discharge date and time: No discharge date for patient encounter.   Activities/Precautions/ Functional Status: Activity: activity as tolerated Diet: regular diet Wound Care: none needed Functional status:  ___ No restrictions     ___ Walk up steps independently ___ 24/7 supervision/assistance   ___ Walk up steps with assistance ___ Intermittent supervision/assistance  ___ Bathe/dress independently ___ Walk with walker     _x__ Bathe/dress with assistance ___ Walk Independently    ___ Shower independently ___ Walk with assistance    ___ Shower with assistance ___ No alcohol     ___ Return to work/school ________  Special Instructions:  No driving   COMMUNITY REFERRALS UPON DISCHARGE:    Outpatient: PT & OT  Agency:[facility] Phone:[phone]   Date of Last Service:05/07/2018  Appointment Date/Time:WILL CALL PATIENT OR WIFE TO SCHEDULE APPOINTMENT  Medical Equipment/Items Ordered:NO NEEDS HAS FROM PREVIOUS HIP SURGERY     GENERAL COMMUNITY RESOURCES FOR PATIENT/FAMILY: Support Groups:[facility] SUPPORT GROUP  EVERY SECOND Thursday @ 6:00-7:00 PM ON THE REHAB UNIT QUESTIONS CALL [phone]  STROKE/TIA DISCHARGE INSTRUCTIONS SMOKING Cigarette smoking nearly doubles your risk of having a stroke & is the single most alterable risk factor  If you smoke or have smoked in the last 12 months, you are advised to quit smoking for your health.  Most of the excess cardiovascular risk related to smoking disappears within a year of stopping.  Ask you doctor about anti-smoking medications  [place] Quit Line: 1-800-QUIT NOW  Free Smoking Cessation Classes [phone]  CHOLESTEROL Know your levels; limit fat & cholesterol in your diet  Lipid Panel     Component Value Date/Time   CHOL 157 04/26/2018 0355   TRIG 123 04/26/2018 0355   HDL 60 04/26/2018 0355   CHOLHDL 2.6 04/26/2018 0355   VLDL 25 04/26/2018 0355   LDLCALC 72 04/26/2018 0355        Many patients benefit from treatment even if their cholesterol is at goal.  Goal: Total Cholesterol (CHOL) less than 160  Goal:  Triglycerides (TRIG) less than 150  Goal:  HDL greater than 40  Goal:  LDL (LDLCALC) less than 100   BLOOD PRESSURE American Stroke Association blood pressure target is less that 120/80 mm/Hg  Your discharge blood pressure is:  BP: (!) 149/89  Monitor your blood pressure  Limit your salt and alcohol intake  Many individuals will require Jordan than one medication for high blood pressure  DIABETES (A1c is a blood sugar average for last 3 months) Goal HGBA1c is under 7% (HBGA1c is blood sugar average for last 3 months)  Diabetes: No known diagnosis of diabetes    Lab Results  Component Value Date   HGBA1C 5.8 (H) 04/26/2018     Your HGBA1c can be lowered with medications, healthy diet, and exercise.  Check your blood sugar as directed by your physician  Call your physician if you experience unexplained or low blood sugars.  PHYSICAL ACTIVITY/REHABILITATION Goal is 30 minutes at least 4 days per week  Activity: Increase activity slowly, Therapies: Physical Therapy: Home Health Return to work:   Activity decreases your risk of heart attack and stroke and makes your heart stronger.  It helps control your weight and blood pressure; helps you relax and can improve your mood.  Participate in a regular exercise program.  Talk with your doctor about the best form of exercise for you (dancing, walking, swimming, cycling).  DIET/WEIGHT Goal is to maintain a  healthy weight  Your discharge diet is:  Diet Order            Diet Heart Room service appropriate? Yes; Fluid consistency: Thin  Diet effective now              liquids Your height is:  Height: 5\' 10"  (177.8 cm) Your current weight is: Weight: 80.4 kg Your Body Mass Index (BMI) is:  BMI (Calculated): 25.43  Following the type of diet specifically designed for you will help prevent another  stroke.  Your goal weight range is:    Your goal Body Mass Index (BMI) is 19-24.  Healthy food habits can help reduce 3 risk factors for stroke:  High cholesterol, hypertension, and excess weight.  RESOURCES Stroke/Support Group:  Call [phone]   STROKE EDUCATION PROVIDED/REVIEWED AND GIVEN TO PATIENT Stroke warning signs and symptoms How to activate emergency medical system (call 911). Medications prescribed at discharge. Need for follow-up after discharge. Personal risk factors for stroke. Pneumonia vaccine given:  Flu vaccine given:  My questions have been answered, the writing is legible, and I understand these instructions.  I will adhere to these goals & educational materials that have been provided to me after my discharge from the hospital.     My questions have been answered and I understand these instructions. I will adhere to these goals and the provided educational materials after my discharge from the hospital.  Patient/Caregiver Signature _______________________________ Date __________  Clinician Signature _______________________________________ Date __________  Please bring this form and your medication list with you to all your follow-up doctor's appointments.

## 2018-05-06 NOTE — Progress Notes (Signed)
Social Work Patient ID: William Jordan, male   DOB: 1946/04/07, 72 y.o.   MRN: [id number]  Met with pt to discuss discharge tomorrow and his needs. He is agreeable to follow up PT and has a certain person he wants to see him. [facility] will make referral for this. He has all his equipment form his hip replacement years ago.

## 2018-05-06 NOTE — Progress Notes (Signed)
Physical Therapy Discharge Summary  Patient Details  Name: William Jordan MRN: [id number] Date of Birth: 1945-11-29  Today's Date: 05/06/2018 PT Individual Time: 0900-1000 PT Individual Time Calculation (min): 60 min    Patient has met 8 of 8 long term goals due to improved activity tolerance, improved balance, improved postural control, increased strength, ability to compensate for deficits, functional use of  left upper extremity and left lower extremity, improved attention, improved awareness and improved coordination.  Patient to discharge at an ambulatory level Supervision.   Patient's care partner is independent to provide the necessary physical assistance at discharge.  Reasons goals not met: All goals met  Recommendation:  Patient will benefit from ongoing skilled PT services in outpatient setting to continue to advance safe functional mobility, address ongoing impairments in balance, activity tolerance, strength, and minimize fall risk.  Equipment: No equipment provided  Reasons for discharge: treatment goals met and discharge from hospital  Patient/family agrees with progress made and goals achieved: Yes  PT Discharge Precautions/Restrictions Precautions Precautions: Fall Restrictions Weight Bearing Restrictions: No Pain Pain Assessment Pain Scale: 0-10 Pain Score: 5  Pain Type: Chronic pain Pain Location: Foot Pain Orientation: Right;Left Pain Descriptors / Indicators: Aching;Burning;Discomfort Pain Frequency: Constant Pain Onset: On-going Patients Stated Pain Goal: 2 Pain Intervention(s): Medication (See eMAR) Vision/Perception  Perception Perception: Impaired Inattention/Neglect: Does not attend to left visual field;Does not attend to left side of body Praxis Praxis: Intact  Cognition Overall Cognitive Status: Within Functional Limits for tasks assessed Arousal/Alertness: Awake/alert Orientation Level: Oriented X4 Attention: Selective Selective Attention:  Appears intact Memory: Appears intact Awareness: Appears intact Problem Solving: Appears intact Safety/Judgment: Appears intact Sensation Sensation Light Touch: Impaired by gross assessment(bilat feet from neuropathy) Proprioception: Impaired by gross assessment(L hallux, neuropathy) Coordination Gross Motor Movements are Fluid and Coordinated: Yes Heel Shin Test: [facility] bilaterally Motor  Motor Motor - Discharge Observations: WFL  Mobility Bed Mobility Bed Mobility: Supine to Sit;Sit to Supine Supine to Sit: Independent Sit to Supine: Independent Transfers Transfers: Sit to [employer] Transfers Sit to Stand: Independent Stand Pivot Transfers: Independent Transfer (Assistive device): None Locomotion  Gait Ambulation: Yes Gait Assistance: Supervision/Verbal cueing Gait Distance (Feet): 175 Feet Assistive device: None Gait Assistance Details: Verbal cues for precautions/safety Gait Gait: Yes Gait Pattern: Impaired Gait Pattern: Decreased stride length;Decreased stance time - left;Lateral hip instability;Decreased trunk rotation;Poor foot clearance - right;Poor foot clearance - left Gait velocity: 2.0 ft/sec Stairs / Additional Locomotion Stairs: Yes Stairs Assistance: Supervision/Verbal cueing Stair Management Technique: Two rails Number of Stairs: 12 Height of Stairs: 6 Ramp: Supervision/Verbal cueing Curb: Supervision/Verbal cueing Wheelchair Mobility Wheelchair Mobility: No  Trunk/Postural Assessment  Cervical Assessment Cervical Assessment: Within Functional Limits Thoracic Assessment Thoracic Assessment: Exceptions to WFL(increased kyphosis) Lumbar Assessment Lumbar Assessment: Within Functional Limits Postural Control Postural Control: Within Functional Limits  Balance Balance Balance Assessed: Yes Standardized Balance Assessment Standardized Balance Assessment: Berg Balance Test;Timed Up and Go Test Berg Balance Test Sit to Stand: Able to stand  without using hands and stabilize independently Standing Unsupported: Able to stand safely 2 minutes Sitting with Back Unsupported but Feet Supported on Floor or Stool: Able to sit safely and securely 2 minutes Stand to Sit: Sits safely with minimal use of hands Transfers: Able to transfer safely, minor use of hands Standing Unsupported with Eyes Closed: Able to stand 10 seconds safely Standing Ubsupported with Feet Together: Able to place feet together independently and stand 1 minute safely From Standing, Reach Forward with Outstretched Arm: Can  reach confidently >25 cm (10") From Standing Position, Pick up Object from Floor: Able to pick up shoe safely and easily From Standing Position, Turn to Look Behind Over each Shoulder: Looks behind one side only/other side shows less weight shift Turn 360 Degrees: Able to turn 360 degrees safely in 4 seconds or less Standing Unsupported, Alternately Place Feet on Step/Stool: Able to stand independently and safely and complete 8 steps in 20 seconds Standing Unsupported, One Foot in Front: Able to plae foot ahead of the other independently and hold 30 seconds Standing on One Leg: Able to lift leg independently and hold equal to or more than 3 seconds Total Score: 52 Timed Up and Go Test TUG: Normal TUG Normal TUG (seconds): 14 Static Sitting Balance Static Sitting - Level of Assistance: 7: Independent Dynamic Sitting Balance Dynamic Sitting - Level of Assistance: 7: Independent Static Standing Balance Static Standing - Level of Assistance: 6: Modified independent (Device/Increase time) Dynamic Standing Balance Dynamic Standing - Level of Assistance: 5: Stand by assistance Extremity Assessment  RUE Assessment RUE Assessment: Within Functional Limits LUE Assessment LUE Assessment: Within Functional Limits Passive Range of Motion (PROM) Comments: prior L shoulder surgery with mild limitations in shoulder flexion RLE Assessment RLE Assessment:  Within Functional Limits General Strength Comments: 4/5 hip flexion; remainder 4+/5 to 5/5 throughout LLE Assessment LLE Assessment: Within Functional Limits General Strength Comments: hip flexion 4/5, remainder 4+/5 to 5/5 throughout  Skilled Therapeutic Intervention: Pt received in bed, c/o pain as above and agreeable to treatment. Assessed all mobility as above with modI/S overall, no AD. Reviewed recommendations for S at home based on moderate risk for falls based on Berg and TUG scores. Added 1/4" heel lift to L shoe d/t report leg length discrepancy; pelvic mobility normalized following heel lift added. Returned to room with S gait, no AD. Remained in bed, alarm intact, all needs in reach at completion of session. Pt with no further questions/concerns regarding d/c home at this time.     See Function Navigator for Current Functional Status.  Benjiman Core [facility] 05/06/2018, 10:42 AM

## 2018-05-06 NOTE — Progress Notes (Signed)
Physical Therapy Session Note  Patient Details  Name: William Jordan MRN: [id number] Date of Birth: Oct 25, 1945  Today's Date: 05/06/2018 PT Individual Time: 1500-1530 PT Individual Time Calculation (min): 30 min   Short Term Goals: Week 1:  PT Short Term Goal 1 (Week 1): = LTGs due to ELOS  Skilled Therapeutic Interventions/Progress Updates:    Patient received in bed, very pleasant and willing to participate in PT today. Continued ambulation in hallways with no device and S up to 130f today, otherwise focused on dynamic balance training involving dynamap and tandem stance based activities in parallel bars. Patient with increased foot pain at EOS but able to perform mobility with S and cues for safety. He was left sitting at EOB with all needs met this afternoon.   Therapy Documentation Precautions:  Precautions Precautions: Fall Restrictions Weight Bearing Restrictions: No General:   Vital Signs: Therapy Vitals Pulse Rate: (!) 110 Resp: 19 BP: 111/72 Patient Position (if appropriate): Sitting Oxygen Therapy SpO2: 98 % O2 Device: Room Air Pain: 5/10 general pain, monitored during session and RN aware      See Function Navigator for Current Functional Status.   Therapy/Group: Individual Therapy  KDeniece ReePT, DPT, CBIS  Supplemental Physical Therapist [facility]  Pager [phone]  05/06/2018, 3:36 PM

## 2018-05-06 NOTE — Discharge Summary (Signed)
Discharge summary job [id number]

## 2018-05-06 NOTE — Progress Notes (Signed)
Physical Therapy Session Note  Patient Details  Name: William Jordan MRN: [id number] Date of Birth: 14-Oct-1945  Today's Date: 05/06/2018 PT Individual Time: 0900-1000 PT Individual Time Calculation (min): 60 min   Short Term Goals: Week 1:  PT Short Term Goal 1 (Week 1): = LTGs due to ELOS  Skilled Therapeutic Interventions/Progress Updates:    Pt reports 5/10 B feet pain R>L prior to session start and therapist monitored pain levels throughout session. Pt performs bed mobility & transfers mod I with inc time. Pt performs donning shoes using figure 4 technique and brushing of teeth in room Mod I with inc time. Pt ambulates from room<>gym for 200 feet x2 trials Mod I with no AD. Pt performs seated UE strengthening with 9# wt for 3x15 bicep curls, and 2x12 shoulder flexion with 5#  with therapist providing multimodal cues for proper technique. Pt then performs x12 steps using B HR's for initial trial and single HR for subsequent trial for increased challenge and LE strengthening mod I. Pt then ambulates back to room with no AD mod I and performs ambulatory transfer to chair. Pt left sitting in chair with all needs met.   Therapy Documentation Precautions:  Precautions Precautions: Fall Restrictions Weight Bearing Restrictions: No   See Function Navigator for Current Functional Status.   Therapy/Group: Individual Therapy  Floreen Comber 05/06/2018, 1:49 PM

## 2018-05-06 NOTE — Progress Notes (Signed)
Occupational Therapy Session Note  Patient Details  Name: William Jordan MRN: [id number] Date of Birth: 1945-10-29  Today's Date: 05/06/2018 OT Individual Time: 1100-1200 OT Individual Time Calculation (min): 60 min    Short Term Goals: STG = LTGs   Skilled Therapeutic Interventions/Progress Updates:    Treatment session with focus on safety with functional mobility and transfers.  Pt received seated on toilet finishing toileting tasks.  Pt Mod I with toileting tasks and returned to room without assistance.  Pt reports pain in BLE, RN aware but pain meds not due yet.  Discussed pt goals and plan for d/c tomorrow.  Pt demonstrating safety in room environment with decreased distractions therefore made independent in room to go to and from bathroom without assistance.  Educated on recommendation for supervision with longer distance ambulation and showering due to easily distracted, BLE pain/neuropathy, and mild Lt inattention.  Pt in agreement.  Ambulated to Dayroom without assistance.  Engaged in Wii bowling in standing with supervision due to increased challenge in balance to incorporate quick stepping.  RN provided pain meds during session.  Returned to room and reiterated recommendation for supervision with showering with pt reporting understanding.  Therapy Documentation Precautions:  Precautions Precautions: Fall Restrictions Weight Bearing Restrictions: No Pain: Pain Assessment Pain Scale: 0-10 Pain Score: 5  Pain Type: Chronic pain;Neuropathic pain Pain Location: Foot Pain Orientation: Right;Left Pain Descriptors / Indicators: Aching;Burning;Discomfort Pain Frequency: Constant Pain Onset: On-going Patients Stated Pain Goal: 2 Pain Intervention(s): Medication (See eMAR)  See Function Navigator for Current Functional Status.   Therapy/Group: Individual Therapy  Simonne Come 05/06/2018, 12:07 PM

## 2018-05-06 NOTE — Discharge Summary (Signed)
NAME: William Jordan, DON C. MEDICAL RECORD [id number] ACCOUNT [id number] DATE OF BIRTH:Nov 11, 1945 FACILITY: [facility] LOCATION: [facility] PHYSICIAN:ANDREW Letta Pate, MD  DISCHARGE SUMMARY  DATE OF DISCHARGE:  05/07/2018  DISCHARGE DIAGNOSES: 1.  Right parietal occipital lobe infarction. 2.  Sequential compression devices for deep venous thrombosis prophylaxis. 3.  Pain management.   4.  Non-ST-elevated myocardial infarction. 5.  Diastolic congestive heart failure. 6.  Angioedema related to ACE inhibitor.   7.  Hyperlipidemia.  HOSPITAL COURSE:  This is a 72 year old right-handed male with history of hypertension, idiopathic peripheral neuropathy, spinal cord stimulator in 2016 by Dr. Rolena Infante, angioedema, diastolic congestive heart failure.  Presented on 04/25/2018 with  left-sided weakness.  While in the Emergency Department developed tonic-clonic jerking of the left arm.  CT of the head showed increased conspicuity of multiple small cortical infarctions in the right posterior occipital and parietal lobes.  CT angiogram  of the head and neck showed atherosclerotic changes without stenosis.  MRI could not be completed due to nerve stimulator.  EEG negative for seizure.  Echocardiogram with ejection fraction 65%.  Venous Doppler studies negative.  TEE completed showing  ejection fraction of 60%, no thrombus, negative bubble study.  Underwent loop recorder placement.  Latest cranial CT scan showed unchanged appearance of multiple areas of acute disk subacute ischemia in the right parietal occipital lobe.  No acute  hemorrhage.  Maintained on aspirin and Plavix.  He was admitted to inpatient rehabilitation 04/29/2018.  On 05/01/2018 nonspecific chest pain.  Noted troponin 0.27.  Followed by Cardiology services.  Elevated troponin consistent with non-STEMI.   Initiated heparin therapy.  Discharged to Acute Care services.  Underwent cardiac catheterization 05/03/2018 showing nonobstructive CAD, normal LV  function.  The patient was readmitted back to Inpatient Rehabilitation services to resume inpatient  therapies.  PAST MEDICAL HISTORY:  See discharge diagnoses.  SOCIAL HISTORY:  The patient lives with spouse, independent prior to admission.  FUNCTIONAL STATUS:  Upon admission to rehab services was min mod assist 75 feet, 1-person handheld assist, minimal guard sit to stand, min mod assist with activities of daily living, somewhat impulsive.  PHYSICAL EXAMINATION: VITAL SIGNS:  Blood pressure 141/86, pulse 71, temperature 98, respirations 18. GENERAL:  Alert male, somewhat anxious provides his name and place. HEENT:  EOMs intact. NECK:  Supple, nontender, no JVD. CARDIOVASCULAR:  Rate controlled. ABDOMEN:  Soft, nontender, good bowel sounds. LUNGS:  Clear to auscultation without wheeze, fair awareness of deficits.  REHABILITATION HOSPITAL COURSE:  The patient was admitted to Inpatient Rehabilitation services.  Therapies initiated on a 3-hour daily basis, consisting of physical therapy, occupational therapy, speech therapy and rehabilitation nursing.  Following  issues were addressed.  The patient using assistive device for overall functional mobility.  Somewhat impulsive.   Working with Surveyor, mining.  Working with standing balance and balance strategies.  Intermittent assist for functional status.   Gathering belongings for activities of daily living and homemaking.  Speech therapy ongoing followup or cognitive linguistic evaluations.  He was able to communicate his needs.  Full family teaching was completed and planned discharge to home.  DISCHARGE MEDICATIONS:  Include Uroxatral 10 mg daily, aspirin 325 mg daily, Lipitor 40 mg p.o. daily, Plavix 75 mg p.o. daily, Neurontin 600 mg p.o. t.i.d., Protonix 40 mg daily, Rozerem 8 mg p.o. at bedtime.  Noted the patient for chronic pain was  using oxycodone as needed.    DIET:  His diet was regular.  FOLLOWUP:  The patient would follow up  with  Dr. Alysia Penna at the outpatient rehab center as advised; Dr. Sallyanne Kuster, Cardiology service,  call for appointment; Dr. Erlinda Hong, Neurology services, call for appointment; Dr. Townsend Roger of ____ [facility].  SPECIAL INSTRUCTIONS:  No driving, smoking or alcohol.  TN/NUANCE D:05/06/2018 T:05/06/2018 JOB:[id number]

## 2018-05-06 NOTE — Progress Notes (Signed)
Occupational Therapy Discharge Summary  Patient Details  Name: William Jordan MRN: [id number] Date of Birth: Apr 20, 1946  Patient has met 9 of 9 long term goals due to improved activity tolerance, improved balance, ability to compensate for deficits, functional use of  LEFT upper extremity, improved awareness and improved coordination.  Patient to discharge at overall Mod I dressing and toilet transfers, supervision shower transfer and bathing at shower level level.  Patient's care partner is independent to provide the necessary intermittent assistance at discharge.    Reasons goals not met: N/A  Recommendation:  Patient will benefit from ongoing skilled OT services in home health setting to continue to advance functional skills in the area of BADL and Reduce care partner burden.  Equipment: No equipment provided  Reasons for discharge: treatment goals met and discharge from hospital  Patient/family agrees with progress made and goals achieved: Yes  OT Discharge Precautions/Restrictions  Precautions Precautions: Fall Restrictions Weight Bearing Restrictions: No Pain Pain Assessment Pain Scale: 0-10 Pain Score: 5  Pain Type: Chronic pain;Neuropathic pain Pain Location: Foot Pain Orientation: Right;Left Pain Descriptors / Indicators: Aching;Burning;Discomfort Pain Frequency: Constant Pain Onset: On-going Patients Stated Pain Goal: 2 Pain Intervention(s): Medication (See eMAR) ADL  See Function Navigator Vision Baseline Vision/History: Wears glasses Wears Glasses: At all times Patient Visual Report: No change from baseline Vision Assessment?: Yes Eye Alignment: Within Functional Limits Alignment/Gaze Preference: Within Defined Limits Tracking/Visual Pursuits: Requires cues, head turns, or add eye shifts to track Saccades: Within functional limits;Additional head turns occurred during testing Visual Fields: No apparent deficits Perception  Perception:  Impaired Inattention/Neglect: Does not attend to left visual field;Does not attend to left side of body Praxis Praxis: Intact Cognition Overall Cognitive Status: Within Functional Limits for tasks assessed Arousal/Alertness: Awake/alert Orientation Level: Oriented X4 Attention: Selective Selective Attention: Appears intact Memory: Appears intact Awareness: Appears intact Problem Solving: Appears intact Safety/Judgment: Appears intact Sensation Sensation Light Touch: Impaired by gross assessment(bilat feet from neuropathy) Proprioception: Impaired by gross assessment(L hallux, neuropathy) Coordination Gross Motor Movements are Fluid and Coordinated: Yes Fine Motor Movements are Fluid and Coordinated: No Coordination and Movement Description: mild delay in LUE Finger Nose Finger Test: mild delay and decreased motor control in LUE, however WNL Heel Shin Test: [facility] bilaterally Mobility  Bed Mobility Bed Mobility: Supine to Sit;Sit to Supine Supine to Sit: Independent Sit to Supine: Independent Transfers Sit to Stand: Independent  Trunk/Postural Assessment  Cervical Assessment Cervical Assessment: Within Functional Limits Thoracic Assessment Thoracic Assessment: Exceptions to WFL(increased kyphosis) Lumbar Assessment Lumbar Assessment: Within Functional Limits Postural Control Postural Control: Within Functional Limits  Balance Balance Balance Assessed: (P) Yes Standardized Balance Assessment Standardized Balance Assessment: (P) Berg Balance Test;Timed Up and Go Test Berg Balance Test Sit to Stand: (P) Able to stand without using hands and stabilize independently Standing Unsupported: (P) Able to stand safely 2 minutes Sitting with Back Unsupported but Feet Supported on Floor or Stool: (P) Able to sit safely and securely 2 minutes Stand to Sit: (P) Sits safely with minimal use of hands Transfers: (P) Able to transfer safely, minor use of hands Standing Unsupported with  Eyes Closed: (P) Able to stand 10 seconds safely Standing Ubsupported with Feet Together: (P) Able to place feet together independently and stand 1 minute safely From Standing, Reach Forward with Outstretched Arm: (P) Can reach confidently >25 cm (10") From Standing Position, Pick up Object from Floor: (P) Able to pick up shoe safely and easily From Standing Position, Turn to Look Behind  Over each Shoulder: (P) Looks behind one side only/other side shows less weight shift Turn 360 Degrees: (P) Able to turn 360 degrees safely in 4 seconds or less Standing Unsupported, Alternately Place Feet on Step/Stool: (P) Able to stand independently and safely and complete 8 steps in 20 seconds Standing Unsupported, One Foot in Front: (P) Able to plae foot ahead of the other independently and hold 30 seconds Standing on One Leg: (P) Able to lift leg independently and hold equal to or more than 3 seconds Total Score: (P) 52 Timed Up and Go Test TUG: (P) Normal TUG Static Sitting Balance Static Sitting - Level of Assistance: (P) 7: Independent Dynamic Sitting Balance Dynamic Sitting - Level of Assistance: (P) 7: Independent Static Standing Balance Static Standing - Level of Assistance: (P) 6: Modified independent (Device/Increase time) Dynamic Standing Balance Dynamic Standing - Level of Assistance: (P) 5: Stand by assistance Extremity/Trunk Assessment RUE Assessment RUE Assessment: Within Functional Limits LUE Assessment LUE Assessment: Within Functional Limits Passive Range of Motion (PROM) Comments: prior L shoulder surgery with mild limitations in shoulder flexion   See Function Navigator for Current Functional Status.  Simonne Come 05/06/2018, 12:14 PM

## 2018-05-06 NOTE — Progress Notes (Signed)
Subjective/Complaints:   No issues overnite except neuropathy pain in feet.  Usually takes gabapentin 637m TID, currently on 3070mTID, GFR nl ROS:No CP or SOB, no N/V?D Objective: Vital Signs: Blood pressure 130/88, pulse 60, temperature (!) 97.5 F (36.4 C), temperature source Oral, resp. rate 16, height '5\' 10"'  (1.778 m), weight 81.4 kg, SpO2 93 %. No results found. Results for orders placed or performed during the hospital encounter of 05/04/18 (from the past 72 hour(s))  CBC WITH DIFFERENTIAL     Status: Abnormal   Collection Time: 05/05/18  5:51 AM  Result Value Ref Range   WBC 7.0 4.0 - 10.5 K/uL   RBC 4.38 4.22 - 5.81 MIL/uL   Hemoglobin 12.5 (L) 13.0 - 17.0 g/dL   HCT 38.7 (L) 39.0 - 52.0 %   MCV 88.4 78.0 - 100.0 fL   MCH 28.5 26.0 - 34.0 pg   MCHC 32.3 30.0 - 36.0 g/dL   RDW 14.1 11.5 - 15.5 %   Platelets 246 150 - 400 K/uL   Neutrophils Relative % 54 %   Neutro Abs 3.8 1.7 - 7.7 K/uL   Lymphocytes Relative 26 %   Lymphs Abs 1.8 0.7 - 4.0 K/uL   Monocytes Relative 13 %   Monocytes Absolute 0.9 0.1 - 1.0 K/uL   Eosinophils Relative 5 %   Eosinophils Absolute 0.3 0.0 - 0.7 K/uL   Basophils Relative 1 %   Basophils Absolute 0.1 0.0 - 0.1 K/uL   Immature Granulocytes 1 %   Abs Immature Granulocytes 0.1 0.0 - 0.1 K/uL    Comment: Performed at [facility] [place] [id number] metabolic panel     Status: None   Collection Time: 05/05/18  5:51 AM  Result Value Ref Range   Sodium 135 135 - 145 mmol/L   Potassium 4.4 3.5 - 5.1 mmol/L   Chloride 99 98 - 111 mmol/L   CO2 29 22 - 32 mmol/L   Glucose, Bld 92 70 - 99 mg/dL   BUN 17 8 - 23 mg/dL   Creatinine, Ser 1.06 0.61 - 1.24 mg/dL   Calcium 9.2 8.9 - 10.3 mg/dL   Total Protein 6.7 6.5 - 8.1 g/dL   Albumin 3.6 3.5 - 5.0 g/dL   AST 24 15 - 41 U/L   ALT 21 0 - 44 U/L   Alkaline Phosphatase 95 38 - 126 U/L   Total Bilirubin 0.5 0.3 - 1.2 mg/dL   GFR calc non Af Amer >60  >60 mL/min   GFR calc Af Amer >60 >60 mL/min    Comment: (NOTE) The eGFR has been calculated using the CKD EPI equation. This calculation has not been validated in all clinical situations. eGFR's persistently <60 mL/min signify possible Chronic Kidney Disease.    Anion gap 7 5 - 15    Comment: Performed at [facility] [place]   HEENT: normal Cardio: RRR and no murmur Resp: CTA B/L and Unlabored GI: BS positive and NT, ND Extremity:  No Edema or erythema Skin:   Intact Neuro: Alert/Oriented and Abnormal Motor 4/5 in LUE adn LLE, 5/5 on the RIght side Musc/Skel:  Other no pain with UE or LE ROM, no LE swelling or skin lesions   Gen NAD   Assessment/Plan: 1. Functional deficits secondary to Right parieto occipital infarct which require 3+ hours per day of interdisciplinary therapy in a comprehensive inpatient rehab setting.  Physiatrist is providing close team supervision and 24 hour management of active medical problems listed below. Physiatrist and rehab team continue to assess barriers to discharge/monitor patient progress toward functional and medical goals. FIM: Function - Bathing Position: Shower Body parts bathed by patient: Right arm, Left arm, Chest, Abdomen, Front perineal area, Buttocks, Right upper leg, Left upper leg, Right lower leg, Left lower leg, Back Assist Level: Supervision or verbal cues, Set up  Function- Upper Body Dressing/Undressing What is the patient wearing?: Pull over shirt/dress Pull over shirt/dress - Perfomed by patient: Thread/unthread right sleeve, Thread/unthread left sleeve, Put head through opening, Pull shirt over trunk Assist Level: No help, No cues Function - Lower Body Dressing/Undressing What is the patient wearing?: Underwear, Pants, Socks, Shoes Position: Sitting EOB Underwear - Performed by patient: Thread/unthread right underwear leg, Thread/unthread left underwear leg, Pull underwear  up/down Pants- Performed by patient: Thread/unthread right pants leg, Thread/unthread left pants leg, Pull pants up/down, Fasten/unfasten pants Socks - Performed by patient: Don/doff right sock, Don/doff left sock Shoes - Performed by patient: Don/doff right shoe, Don/doff left shoe, Fasten right, Fasten left Assist for footwear: Independent Assist for lower body dressing: Supervision or verbal cues     Function - Toilet Transfers Assist level to toilet: Supervision or verbal cues Assist level from toilet: Supervision or verbal cues  Function - Chair/bed transfer Chair/bed transfer method: Ambulatory Chair/bed transfer assist level: Supervision or verbal cues Chair/bed transfer assistive device: Armrests Chair/bed transfer details: Verbal cues for precautions/safety  Function - Locomotion: Wheelchair Will patient use wheelchair at discharge?: No Type: Manual Wheelchair activity did not occur: N/A(ambulatory ) Wheel 50 feet with 2 turns activity did not occur: N/A(ambulatory ) Wheel 150 feet activity did not occur: N/A(ambulatory ) Turns around,maneuvers to table,bed, and toilet,negotiates 3% grade,maneuvers on rugs and over doorsills: No Function - Locomotion: Ambulation Assistive device: No device Max distance: 150 ft  Assist level: Touching or steadying assistance (Pt > 75%) Assist level: Touching or steadying assistance (Pt > 75%) Assist level: Touching or steadying assistance (Pt > 75%) Assist level: Touching or steadying assistance (Pt > 75%) Assist level: Touching or steadying assistance (Pt > 75%)  Function - Comprehension Comprehension: Auditory Comprehension assist level: Follows complex conversation/direction with no assist  Function - Expression Expression: Verbal Expression assist level: Expresses complex ideas: With no assist  Function - Social Interaction Social Interaction assist level: Interacts appropriately with others - No medications needed.  Function  - Problem Solving Problem solving assist level: Solves complex problems: Recognizes & self-corrects  Function - Memory Memory assist level: Complete Independence: No helper Patient normally able to recall (first 3 days only): Current season, Location of own room, That he or she is in a hospital, Staff names and faces  Medical Problem List and Plan: 1.Left-sided weaknesssecondary to right parieto-occipital lobe infarction. Status post loop recorder. Pt is being readmitted for an interrupted stay after STEMI. Resume prior functional plan, discharge goals remain attainable. PT,OT, SLP plan d/c in am 2. DVT Prophylaxis/Anticoagulation: SCDs 3. Pain Management:Neurontin 300 mg 3 times daily, oxycodone as needed, foot pain c/w prior hx neuropathy , increase Gabapentin to home dose4. Mood:Provide emotional support.Rozerem8 mg daily 5. Neuropsych: This patientiscapable of making decisions on hisown behalf. 6. Skin/Wound Care:Routine skin checks 7. Fluids/Electrolytes/Nutrition:Routine in and outs , BMET normal on 8/21 8.Non-STEMI. Status post cardiac catheterization. Continue aspirin. Follow cardiology services.  Monitor for activity tolerance with therapies 9.Diastolic congestive heart failure. Monitor for any signs of fluid overload -I:  915m 10.[facility]edema. [facility]. Continue to monitor 11.Hyperlipidemia. Lipitor, no diffuse muscle ache to implicate statin LOS (Days) 2 A FACE TO FACE EVALUATION WAS PERFORMED  ACharlett Blake8/22/2019, 9:08 AM

## 2018-05-07 ENCOUNTER — Inpatient Hospital Stay (HOSPITAL_COMMUNITY): Payer: PPO | Admitting: Physical Therapy

## 2018-05-07 DIAGNOSIS — I69398 Other sequelae of cerebral infarction: Secondary | ICD-10-CM

## 2018-05-07 DIAGNOSIS — R269 Unspecified abnormalities of gait and mobility: Secondary | ICD-10-CM

## 2018-05-07 DIAGNOSIS — G894 Chronic pain syndrome: Secondary | ICD-10-CM

## 2018-05-07 NOTE — IPOC Note (Signed)
Overall Plan of Care [facility]) Patient Details Name: William Jordan MRN: [id number] DOB: April 22, 1946  Admitting Diagnosis: <principal problem not specified>stroke  Hospital Problems: Active Problems:   Parietal lobe infarction ([place])   Gait disturbance, post-stroke     Functional Problem List: Nursing Bowel, Endurance, Medication Management, Pain  PT    OT Balance, Perception, Vision  SLP    TR         Basic ADL's: OT Grooming, Bathing, Dressing, Toileting     Advanced  ADL's: OT       Transfers: PT    OT Toilet, Tub/Shower     Locomotion: PT       Additional Impairments: OT Fuctional Use of Upper Extremity  SLP None      TR      Anticipated Outcomes Item Anticipated Outcome  Self Feeding    Swallowing      Basic self-care  Mod I dressing, supervision showering  Toileting  Mod I   Bathroom Transfers Mod I to toilet, S to shower  Bowel/Bladder  Remain continent of b/b  Transfers     Locomotion     Communication     Cognition     Pain  Has neuropathy pain tolerance 3  Safety/Judgment  free of falls or injuries, maintain safety parameter and protocol   Therapy Plan:   OT Intensity: Minimum of 1-2 x/day, 45 to 90 minutes OT Frequency: 5 out of 7 days OT Duration/Estimated Length of Stay: 1-2 days      Team Interventions: Nursing Interventions Patient/Family Education, Bowel Management, Medication Management, Discharge Planning  PT interventions    OT Interventions Balance/vestibular training, Discharge planning, Functional mobility training, DME/adaptive equipment instruction, Neuromuscular re-education, Patient/family education, Self Care/advanced ADL retraining, Therapeutic Exercise, Therapeutic Activities, UE/LE Strength taining/ROM, UE/LE Coordination activities  SLP Interventions    TR Interventions    SW/CM Interventions     Barriers to Discharge MD  Medical stability  Nursing Medication compliance, Medical stability    PT      OT     SLP      SW       Team Discharge Planning: Destination: PT-  ,OT- Home , SLP-Home Projected Follow-up: PT- , OT-  Home health OT, SLP-None Projected Equipment Needs: PT- , OT- None recommended by OT, SLP-None recommended by SLP Equipment Details: PT- , OT-  Patient/family involved in discharge planning: PT-  ,  OT-Patient, SLP-Patient  MD ELOS: 1-2 days Medical Rehab Prognosis:  Excellent Assessment: The patient has been admitted for CIR therapies with the diagnosis of CVA. The team will be addressing functional mobility, strength, stamina, balance, safety, adaptive techniques and equipment, self-care, bowel and bladder mgt, patient and caregiver education, NMR, family ed, community reentry. Goals have been set at William Lope, MD, [facility]      See Team Conference Notes for weekly updates to the plan of care

## 2018-05-07 NOTE — Plan of Care (Signed)
  Problem: RH PAIN MANAGEMENT Goal: RH STG PAIN MANAGED AT OR BELOW PT'S PAIN GOAL Description <4 on a 0-10 pain scale  Outcome: Completed/Met

## 2018-05-07 NOTE — Progress Notes (Signed)
Social Work  Discharge Note  The overall goal for the admission was met for:   Discharge location: Yes-HOME WITH WIFE WHO CAN PROVIDE SUPERVISION LEVEL  Length of Stay: Yes-9 DAYS  Discharge activity level: Yes-SUPERVISION LEVEL  Home/community participation: Yes  Services provided included: MD, RD, PT, OT, RN, CM, Pharmacy and SW  Financial Services: Private Insurance: [place]  Follow-up services arranged: Outpatient: PRO PT OPPT & OT WILL CONTACT PT TO SET UP APPOINTMENT PER PT'S REQUEST HAD USED BEFORE  Comments (or additional information):PT DID WELL ONCE MEDICAL ISSUES RESOLVED AND READY TO GO HOME WITH WIFE.  Patient/Family verbalized understanding of follow-up arrangements: Yes  Individual responsible for coordination of the follow-up plan: SELF & SHERRY-WIFE  Confirmed correct DME delivered: Elease Hashimoto 05/07/2018    Elease Hashimoto

## 2018-05-07 NOTE — Progress Notes (Signed)
Subjective/Complaints:  No issues overnite, discussed renal fxn and gabapentin dose ROS:No CP or SOB, no N/V?D Objective: Vital Signs: Blood pressure (!) 149/87, pulse 66, temperature 98.6 F (37 C), resp. rate 19, height '5\' 10"'  (1.778 m), weight 81.4 kg, SpO2 97 %. No results found. Results for orders placed or performed during the hospital encounter of 05/04/18 (from the past 72 hour(s))  CBC WITH DIFFERENTIAL     Status: Abnormal   Collection Time: 05/05/18  5:51 AM  Result Value Ref Range   WBC 7.0 4.0 - 10.5 K/uL   RBC 4.38 4.22 - 5.81 MIL/uL   Hemoglobin 12.5 (L) 13.0 - 17.0 g/dL   HCT 38.7 (L) 39.0 - 52.0 %   MCV 88.4 78.0 - 100.0 fL   MCH 28.5 26.0 - 34.0 pg   MCHC 32.3 30.0 - 36.0 g/dL   RDW 14.1 11.5 - 15.5 %   Platelets 246 150 - 400 K/uL   Neutrophils Relative % 54 %   Neutro Abs 3.8 1.7 - 7.7 K/uL   Lymphocytes Relative 26 %   Lymphs Abs 1.8 0.7 - 4.0 K/uL   Monocytes Relative 13 %   Monocytes Absolute 0.9 0.1 - 1.0 K/uL   Eosinophils Relative 5 %   Eosinophils Absolute 0.3 0.0 - 0.7 K/uL   Basophils Relative 1 %   Basophils Absolute 0.1 0.0 - 0.1 K/uL   Immature Granulocytes 1 %   Abs Immature Granulocytes 0.1 0.0 - 0.1 K/uL    Comment: Performed at [facility], [address] [place], [place] [address]  Comprehensive metabolic panel     Status: None   Collection Time: 05/05/18  5:51 AM  Result Value Ref Range   Sodium 135 135 - 145 mmol/L   Potassium 4.4 3.5 - 5.1 mmol/L   Chloride 99 98 - 111 mmol/L   CO2 29 22 - 32 mmol/L   Glucose, Bld 92 70 - 99 mg/dL   BUN 17 8 - 23 mg/dL   Creatinine, Ser 1.06 0.61 - 1.24 mg/dL   Calcium 9.2 8.9 - 10.3 mg/dL   Total Protein 6.7 6.5 - 8.1 g/dL   Albumin 3.6 3.5 - 5.0 g/dL   AST 24 15 - 41 U/L   ALT 21 0 - 44 U/L   Alkaline Phosphatase 95 38 - 126 U/L   Total Bilirubin 0.5 0.3 - 1.2 mg/dL   GFR calc non Af Amer >60 >60 mL/min   GFR calc Af Amer >60 >60 mL/min    Comment: (NOTE) The eGFR has been  calculated using the CKD EPI equation. This calculation has not been validated in all clinical situations. eGFR's persistently <60 mL/min signify possible Chronic Kidney Disease.    Anion gap 7 5 - 15    Comment: Performed at [facility] [address], [place], [place] [address]     HEENT: normal Cardio: RRR and no murmur Resp: CTA B/L and Unlabored GI: BS positive and NT, ND Extremity:  No Edema or erythema Skin:   Intact Neuro: Alert/Oriented and Abnormal Motor 4/5 in LUE adn LLE, 5/5 on the RIght side Musc/Skel:  Other no pain with UE or LE ROM, no LE swelling or skin lesions   Gen NAD   Assessment/Plan: 1. Functional deficits secondary to Right parieto occipital infarct  Stable for D/C today F/u PCP in 3-4 weeks F/u PM&R 2 weeks See D/C summary See D/C instructions FIM: Function - Bathing Bathing activity did not occur: Refused(showered  8/21) Position: Shower Body parts bathed by patient: Right arm, Left arm, Chest, Abdomen, Front perineal area, Buttocks, Right upper leg, Left upper leg, Right lower leg, Left lower leg, Back Assist Level: Supervision or verbal cues, Set up  Function- Upper Body Dressing/Undressing What is the patient wearing?: Pull over shirt/dress Pull over shirt/dress - Perfomed by patient: Thread/unthread right sleeve, Thread/unthread left sleeve, Put head through opening, Pull shirt over trunk Assist Level: No help, No cues Function - Lower Body Dressing/Undressing What is the patient wearing?: Underwear, Pants, Socks, Shoes Position: Sitting EOB Underwear - Performed by patient: Thread/unthread right underwear leg, Thread/unthread left underwear leg, Pull underwear up/down Pants- Performed by patient: Thread/unthread right pants leg, Thread/unthread left pants leg, Pull pants up/down, Fasten/unfasten pants Socks - Performed by patient: Don/doff right sock, Don/doff left sock Shoes - Performed by patient: Don/doff right shoe, Don/doff left  shoe, Fasten right, Fasten left Assist for footwear: Independent Assist for lower body dressing: No Help, No cues  Function - Toileting Toileting steps completed by patient: Adjust clothing prior to toileting, Performs perineal hygiene, Adjust clothing after toileting Assist level: No help/no cues  Function - Toilet Transfers Assist level to toilet: No Help, no cues, assistive device, takes more than a reasonable amount of time Assist level from toilet: No Help, no cues, assistive device, takes more than a reasonable amount of time  Function - Chair/bed transfer Chair/bed transfer method: Ambulatory Chair/bed transfer assist level: No Help, no cues, assistive device, takes more than a reasonable amount of time Chair/bed transfer assistive device: Armrests Chair/bed transfer details: Verbal cues for precautions/safety  Function - Locomotion: Wheelchair Will patient use wheelchair at discharge?: No Type: Manual Wheelchair activity did not occur: N/A Wheel 50 feet with 2 turns activity did not occur: N/A Wheel 150 feet activity did not occur: N/A(ambulatory ) Turns around,maneuvers to table,bed, and toilet,negotiates 3% grade,maneuvers on rugs and over doorsills: No Function - Locomotion: Ambulation Assistive device: No device Max distance: 150 Assist level: No help, No cues, assistive device, takes more than a reasonable amount of time Assist level: No help, No cues, assistive device, takes more than a reasonable amount of time Assist level: No help, No cues, assistive device, takes more than a reasonable amount of time Assist level: No help, No cues, assistive device, takes more than a reasonable amount of time Assist level: Supervision or verbal cues  Function - Comprehension Comprehension: Auditory Comprehension assist level: Follows complex conversation/direction with no assist  Function - Expression Expression: Verbal Expression assist level: Expresses complex ideas: With  no assist  Function - Social Interaction Social Interaction assist level: Interacts appropriately with others with medication or extra time (anti-anxiety, antidepressant).  Function - Problem Solving Problem solving assist level: Solves complex problems: Recognizes & self-corrects  Function - Memory Memory assist level: Complete Independence: No helper Patient normally able to recall (first 3 days only): Current season, Location of own room, That he or she is in a hospital, Staff names and faces  Medical Problem List and Plan: 1.Left-sided weaknesssecondary to right parieto-occipital lobe infarction. Status post loop recorder. Pt is being readmitted for an interrupted stay after STEMI. Resume prior functional plan, discharge goals remain attainable. PT,OT, SLP plan d/c today 2. DVT Prophylaxis/Anticoagulation: SCDs 3. Pain Management:Neurontin 300 mg 3 times daily, oxycodone as needed, foot pain c/w prior hx neuropathy , increase Gabapentin to home dose4. Mood:Provide emotional support.Rozerem8 mg daily 5. Neuropsych: This patientiscapable of making decisions on hisown behalf. 6. Skin/Wound Care:Routine skin  checks 7. Fluids/Electrolytes/Nutrition:Routine in and outs , BMET normal on 8/21 8.Non-STEMI. Status post cardiac catheterization. Continue aspirin. Follow cardiology services.  Monitor for activity tolerance with therapies 9.Diastolic congestive heart failure. Monitor for any signs of fluid overload asymptomaticl 10.Angioedema. Patient recently discharged from [facility] for treatment of angioedema from ACE inhibitor. Continue to monitor 11.Hyperlipidemia. Lipitor, no diffuse muscle ache to implicate statin LOS (Days) 3 A FACE TO FACE EVALUATION WAS PERFORMED  Charlett Blake 05/07/2018, 9:14 AM

## 2018-05-07 NOTE — Progress Notes (Signed)
Patient discharge to home accompany by wife.  AVS given by PA Dan.  Patient is comfortable with baseline chronic pain  In his foot.  Oxycodone given prior to discharge and is taken out by NT [facility]

## 2018-05-10 ENCOUNTER — Ambulatory Visit: Payer: PPO

## 2018-05-10 ENCOUNTER — Other Ambulatory Visit: Payer: Self-pay | Admitting: *Deleted

## 2018-05-10 NOTE — Patient Outreach (Signed)
[facility]) Care Management  05/10/2018  DARRAGH NAY 09-10-1946 [id number]   EMMI-  stroke     RED ON EMMI ALERT Day # 1 Date: 05/08/18 Red Alert Reason: questions/problesm with meds? Yes scheduled a follow up appointment?  No Filled new prescriptions? No Know how/when to take meds? no    Outreach attempt # 1 No answer. [facility] RN CM left HIPAA compliant voicemail message along with CM's contact info.   Plan: [facility] RN CM sent an unsuccessful outreach letter and scheduled this patient for another call attempt within 4 business days  Kimberly L. Lavina Hamman, RN, BSN, [place] Management Care Coordinator Direct Number [phone] Mobile number [phone]  Main [facility] number [phone] Fax number [phone]

## 2018-05-11 ENCOUNTER — Other Ambulatory Visit: Payer: Self-pay | Admitting: *Deleted

## 2018-05-11 NOTE — Patient Outreach (Signed)
[facility] [facility]) Care Management  05/11/2018  William Jordan 1946/06/21 [id number]   EMMI-              stroke                                       RED ON EMMI ALERT Day # 1 Date: 05/08/18 Red Alert Reason: questions/problesm with meds? Yes scheduled a follow up appointment?  No Filled new prescriptions? No Know how/when to take meds? no    Outreach attempt # 2 No answer. [facility] RN CM left HIPAA compliant voicemail message along with CM's contact info.   Plan: [facility] RN CM scheduled this patient for another call attempt within 4 business days  [name] L. Lavina Hamman, RN, BSN, [place] Management Care Coordinator Direct Number [phone] Mobile number [phone]  Main [facility] number [phone] Fax number [phone]

## 2018-05-11 NOTE — H&P (Signed)
Cardiology Admission History and Physical:   Patient ID: William Jordan; MRN: [id number]; DOB: 10-10-1945   Admission date: (Not on file)  Primary Care Provider: Townsend Roger, MD Primary Cardiologist: No primary care provider on file.  Primary Electrophysiologist:  N/A  Chief Complaint:  Chest pain  Patient Profile:   William Jordan is a 72 y.o. male with a history of recent CVA associated with chest pain and elevated troponin.  History of Present Illness:   William Jordan 72 y.o. male 1 week after presentation with right MCA embolic stroke, likely related to large ulcerated soft plaque in the right common carotid artery with simultaneous complaints of chest pain and elevated troponin consistent with non-STEMI (peak 3.09), transient episodes of nonsustained VT.He is having postinfarction angina.   Past Medical History:  Diagnosis Date  . Angioedema    from Benazepril reaction  . Anxiety    takes Celexa  . Arthritis   . Chronic back pain   . CVA (cerebral vascular accident) ([place])   . Enlarged prostate   . History of blood transfusion   . Hypertension   . Neuropathy     Past Surgical History:  Procedure Laterality Date  . CARDIAC CATHETERIZATION  05/03/2018  . COLONOSCOPY     one polyp removed - benign  . ELBOW SURGERY Right   . HERNIA REPAIR     umbilical  . JOINT REPLACEMENT Left    knee, x 2  . JOINT REPLACEMENT Right    hip  . JOINT REPLACEMENT Left    partial shoulder  . LEFT HEART CATH AND CORONARY ANGIOGRAPHY N/A 05/03/2018   Procedure: LEFT HEART CATH AND CORONARY ANGIOGRAPHY;  Surgeon: Martinique, Beauregard Jarrells M, MD;  Location: [facility];  Service: Cardiovascular;  Laterality: N/A;  . LOOP RECORDER INSERTION N/A 04/27/2018   Procedure: LOOP RECORDER INSERTION;  Surgeon: Evans Lance, MD;  Location: [place];  Service: Cardiovascular;  Laterality: N/A;  . SPINAL CORD STIMULATOR INSERTION N/A 09/28/2014   Procedure: LUMBAR SPINAL CORD STIMULATOR INSERTION;   Surgeon: Melina Schools, MD;  Location: [facility];  Service: Orthopedics;  Laterality: N/A;  . TEE WITHOUT CARDIOVERSION N/A 04/27/2018   Procedure: TRANSESOPHAGEAL ECHOCARDIOGRAM (TEE);  Surgeon: Larey Dresser, MD;  Location: [facility];  Service: Cardiovascular;  Laterality: N/A;  . TONSILLECTOMY    . VASECTOMY       Medications Prior to Admission: Prior to Admission medications   Medication Sig Start Date End Date Taking? Authorizing Provider  acetaminophen (TYLENOL) 325 MG tablet Take 2 tablets (650 mg total) by mouth every 4 (four) hours as needed for headache or mild pain. 05/06/18   Angiulli, Lavon Paganini, PA-C  alfuzosin (UROXATRAL) 10 MG 24 hr tablet Take 1 tablet (10 mg total) by mouth daily after supper. 05/06/18   Angiulli, Lavon Paganini, PA-C  aspirin 325 MG tablet Take 1 tablet (325 mg total) by mouth daily. 04/29/18   Asencion Noble, MD  atorvastatin (LIPITOR) 40 MG tablet Take 1 tablet (40 mg total) by mouth daily at 6 PM. 05/06/18   Angiulli, Lavon Paganini, PA-C  cetirizine (ZYRTEC) 10 MG tablet Take 10 mg by mouth 2 (two) times daily.    [provider]  cholecalciferol (VITAMIN D) 1000 units tablet Take 2,000 Units by mouth daily.    [provider]  clopidogrel (PLAVIX) 75 MG tablet Take 1 tablet (75 mg total) by mouth daily. 05/06/18   Angiulli, Lavon Paganini, PA-C  colchicine 0.6  MG tablet Take 0.6 mg by mouth 3 (three) times daily as needed (for gout flares).     [provider]  docusate sodium (COLACE) 100 MG capsule Take 2 capsules (200 mg total) by mouth at bedtime. Patient taking differently: Take 100 mg by mouth at bedtime.  10/02/15   Debbe Odea, MD  gabapentin (NEURONTIN) 600 MG tablet Take 0.5 tablets (300 mg total) by mouth 3 (three) times daily. 05/06/18   Angiulli, Lavon Paganini, PA-C  Oxycodone HCl 20 MG TABS Take 1 tablet (20 mg total) by mouth every 8 (eight) hours as needed. Patient taking differently: Take 20 mg by mouth every 4 (four) hours as needed  (pain).  12/12/17   Roxan Hockey, MD  pantoprazole (PROTONIX) 40 MG tablet Take 1 tablet (40 mg total) by mouth daily. 05/07/18   Angiulli, Lavon Paganini, PA-C  polyethylene glycol (MIRALAX / GLYCOLAX) packet Take 17 g by mouth daily. Patient not taking: Reported on 04/25/2018 06/12/15   Eugenie Filler, MD  ramelteon (ROZEREM) 8 MG tablet Take 1 tablet (8 mg total) by mouth at bedtime. 05/06/18   Angiulli, Lavon Paganini, PA-C  vitamin B-12 (CYANOCOBALAMIN) 100 MCG tablet Take 1 tablet (100 mcg total) by mouth daily. Patient not taking: Reported on 04/25/2018 08/17/15   Caren Griffins, MD     Allergies:    Allergies  Allergen Reactions  . Ace Inhibitors Other (See Comments)    Angioedema (throat swelling)  . Morphine Other (See Comments)    Causes confusion and "makes me crazy," per the patient  . Sulfa Antibiotics Other (See Comments)    Unknown reaction    Social History:   Social History   Socioeconomic History  . Marital status: Married    Spouse name: Not on file  . Number of children: Not on file  . Years of education: Not on file  . Highest education level: Not on file  Occupational History  . Not on file  Social Needs  . Financial resource strain: Not on file  . Food insecurity:    Worry: Not on file    Inability: Not on file  . Transportation needs:    Medical: Not on file    Non-medical: Not on file  Tobacco Use  . Smoking status: Current Every Day Smoker    Types: E-cigarettes, Cigars  . Smokeless tobacco: Former Systems developer    Types: Snuff  . Tobacco comment: does not use any tobacco products any more  Substance and Sexual Activity  . Alcohol use: Yes    Comment: occ  . Drug use: No  . Sexual activity: Never  Lifestyle  . Physical activity:    Days per week: Not on file    Minutes per session: Not on file  . Stress: Not on file  Relationships  . Social connections:    Talks on phone: Not on file    Gets together: Not on file    Attends religious service: Not  on file    Active member of club or organization: Not on file    Attends meetings of clubs or organizations: Not on file    Relationship status: Not on file  . Intimate partner violence:    Fear of current or ex partner: Not on file    Emotionally abused: Not on file    Physically abused: Not on file    Forced sexual activity: Not on file  Other Topics Concern  . Not on file  Social History Narrative  .  Not on file    Family History:   The patient's family history includes Heart disease in his father.    ROS:  Please see the history of present illness.  All other ROS reviewed and negative.     Physical Exam/Data:  There were no vitals filed for this visit. No intake or output data in the 24 hours ending 05/11/18 0722 There were no vitals filed for this visit. There is no height or weight on file to calculate BMI.  GEN:No acute distress.   Neck:No JVD Cardiac:RRR, no murmurs, rubs, or gallops.  Respiratory:Clear to auscultation bilaterally. XN:ATFT, nontender, non-distended  MS:No edema; No deformity. Neuro:very mild left hemiparesis. Psych: Normal affect    EKG:  The ECG that was done 04/26/18 was personally reviewed and demonstrates sinus brady rate 50. Normal Ecg  Relevant CV Studies: Echo 04/26/2018 ------------------------------------------------------------------- LV EF: 60% - 65% Study Conclusions  - Left ventricle: The cavity size was normal. Wall thickness was increased in a pattern of mild LVH. Systolic function was normal. The estimated ejection fraction was in the range of 60% to 65%  Laboratory Data:  Chemistry Recent Labs  Lab 05/05/18 0551  NA 135  K 4.4  CL 99  CO2 29  GLUCOSE 92  BUN 17  CREATININE 1.06  CALCIUM 9.2  GFRNONAA >60  GFRAA >60  ANIONGAP 7    Recent Labs  Lab 05/05/18 0551  PROT 6.7  ALBUMIN 3.6  AST 24  ALT 21  ALKPHOS 95  BILITOT 0.5   Hematology Recent Labs  Lab 05/05/18 0551  WBC 7.0  RBC  4.38  HGB 12.5*  HCT 38.7*  MCV 88.4  MCH 28.5  MCHC 32.3  RDW 14.1  PLT 246   Cardiac EnzymesNo results for input(s): TROPONINI in the last 168 hours. No results for input(s): TROPIPOC in the last 168 hours.  BNPNo results for input(s): BNP, PROBNP in the last 168 hours.  DDimer No results for input(s): DDIMER in the last 168 hours.  Radiology/Studies:  No results found.  Assessment and Plan:   1. NSTEMI. ? Whether ACS or demand ischemia in setting of recent acute CVA. Patient did experience chest pain and has mild elevation of troponins. Ecg nonacute. Plan for cardiac cath today. He is on DAPT. Should be fine for radial approach.  2. Recent right hemispheric CVA. Improving. Ulcerative soft plaque in right CCA. Medical therapy.   For questions or updates, please contact [place] Please consult [email or web address] for contact info under Cardiology/STEMI.    Signed, Alois Mincer Martinique, MD  05/11/2018 7:22 AM

## 2018-05-11 NOTE — H&P (Deleted)
  The note originally documented on this encounter has been moved the the encounter in which it belongs.  

## 2018-05-12 ENCOUNTER — Other Ambulatory Visit: Payer: Self-pay

## 2018-05-12 ENCOUNTER — Emergency Department (HOSPITAL_COMMUNITY): Payer: PPO

## 2018-05-12 ENCOUNTER — Encounter (HOSPITAL_COMMUNITY): Payer: Self-pay | Admitting: Emergency Medicine

## 2018-05-12 ENCOUNTER — Emergency Department (HOSPITAL_COMMUNITY)
Admission: EM | Admit: 2018-05-12 | Discharge: 2018-05-12 | Disposition: A | Discharge status: Home / Self Care | Payer: PPO | Source: Home / Self Care | Attending: Emergency Medicine | Admitting: Emergency Medicine

## 2018-05-12 DIAGNOSIS — R0989 Other specified symptoms and signs involving the circulatory and respiratory systems: Secondary | ICD-10-CM | POA: Diagnosis not present

## 2018-05-12 DIAGNOSIS — G629 Polyneuropathy, unspecified: Secondary | ICD-10-CM | POA: Diagnosis not present

## 2018-05-12 DIAGNOSIS — G8929 Other chronic pain: Secondary | ICD-10-CM | POA: Diagnosis not present

## 2018-05-12 DIAGNOSIS — M549 Dorsalgia, unspecified: Secondary | ICD-10-CM | POA: Diagnosis not present

## 2018-05-12 DIAGNOSIS — Z7982 Long term (current) use of aspirin: Secondary | ICD-10-CM

## 2018-05-12 DIAGNOSIS — G92 Toxic encephalopathy: Secondary | ICD-10-CM | POA: Diagnosis not present

## 2018-05-12 DIAGNOSIS — R400 Somnolence: Secondary | ICD-10-CM | POA: Insufficient documentation

## 2018-05-12 DIAGNOSIS — E512 Wernicke's encephalopathy: Secondary | ICD-10-CM | POA: Diagnosis not present

## 2018-05-12 DIAGNOSIS — I10 Essential (primary) hypertension: Secondary | ICD-10-CM

## 2018-05-12 DIAGNOSIS — I252 Old myocardial infarction: Secondary | ICD-10-CM | POA: Diagnosis not present

## 2018-05-12 DIAGNOSIS — F17290 Nicotine dependence, other tobacco product, uncomplicated: Secondary | ICD-10-CM | POA: Insufficient documentation

## 2018-05-12 DIAGNOSIS — I2510 Atherosclerotic heart disease of native coronary artery without angina pectoris: Secondary | ICD-10-CM | POA: Diagnosis not present

## 2018-05-12 DIAGNOSIS — Z79899 Other long term (current) drug therapy: Secondary | ICD-10-CM | POA: Insufficient documentation

## 2018-05-12 DIAGNOSIS — F329 Major depressive disorder, single episode, unspecified: Secondary | ICD-10-CM | POA: Diagnosis not present

## 2018-05-12 DIAGNOSIS — G9340 Encephalopathy, unspecified: Secondary | ICD-10-CM | POA: Diagnosis not present

## 2018-05-12 DIAGNOSIS — Z7902 Long term (current) use of antithrombotics/antiplatelets: Secondary | ICD-10-CM | POA: Insufficient documentation

## 2018-05-12 DIAGNOSIS — R410 Disorientation, unspecified: Secondary | ICD-10-CM | POA: Diagnosis not present

## 2018-05-12 DIAGNOSIS — R0902 Hypoxemia: Secondary | ICD-10-CM | POA: Diagnosis not present

## 2018-05-12 DIAGNOSIS — E860 Dehydration: Secondary | ICD-10-CM

## 2018-05-12 DIAGNOSIS — N400 Enlarged prostate without lower urinary tract symptoms: Secondary | ICD-10-CM | POA: Diagnosis not present

## 2018-05-12 DIAGNOSIS — I5030 Unspecified diastolic (congestive) heart failure: Secondary | ICD-10-CM | POA: Diagnosis not present

## 2018-05-12 DIAGNOSIS — R29701 NIHSS score 1: Secondary | ICD-10-CM | POA: Diagnosis not present

## 2018-05-12 DIAGNOSIS — R4182 Altered mental status, unspecified: Secondary | ICD-10-CM | POA: Diagnosis not present

## 2018-05-12 DIAGNOSIS — R569 Unspecified convulsions: Secondary | ICD-10-CM | POA: Diagnosis not present

## 2018-05-12 DIAGNOSIS — R451 Restlessness and agitation: Secondary | ICD-10-CM | POA: Diagnosis not present

## 2018-05-12 DIAGNOSIS — G40909 Epilepsy, unspecified, not intractable, without status epilepticus: Secondary | ICD-10-CM | POA: Diagnosis not present

## 2018-05-12 DIAGNOSIS — T426X5A Adverse effect of other antiepileptic and sedative-hypnotic drugs, initial encounter: Secondary | ICD-10-CM | POA: Diagnosis not present

## 2018-05-12 DIAGNOSIS — I69354 Hemiplegia and hemiparesis following cerebral infarction affecting left non-dominant side: Secondary | ICD-10-CM | POA: Diagnosis not present

## 2018-05-12 DIAGNOSIS — I110 Hypertensive heart disease with heart failure: Secondary | ICD-10-CM | POA: Diagnosis not present

## 2018-05-12 DIAGNOSIS — R001 Bradycardia, unspecified: Secondary | ICD-10-CM | POA: Diagnosis not present

## 2018-05-12 DIAGNOSIS — F419 Anxiety disorder, unspecified: Secondary | ICD-10-CM | POA: Diagnosis not present

## 2018-05-12 DIAGNOSIS — R609 Edema, unspecified: Secondary | ICD-10-CM | POA: Diagnosis not present

## 2018-05-12 LAB — CBC
HEMATOCRIT: 35.3 % — AB (ref 39.0–52.0)
Hemoglobin: 10.9 g/dL — ABNORMAL LOW (ref 13.0–17.0)
MCH: 28.5 pg (ref 26.0–34.0)
MCHC: 30.9 g/dL (ref 30.0–36.0)
MCV: 92.2 fL (ref 78.0–100.0)
Platelets: 177 10*3/uL (ref 150–400)
RBC: 3.83 MIL/uL — ABNORMAL LOW (ref 4.22–5.81)
RDW: 14 % (ref 11.5–15.5)
WBC: 4.7 10*3/uL (ref 4.0–10.5)

## 2018-05-12 LAB — URINALYSIS, ROUTINE W REFLEX MICROSCOPIC
BILIRUBIN URINE: NEGATIVE
Bacteria, UA: NONE SEEN
GLUCOSE, UA: NEGATIVE mg/dL
HGB URINE DIPSTICK: NEGATIVE
Ketones, ur: NEGATIVE mg/dL
LEUKOCYTES UA: NEGATIVE
NITRITE: NEGATIVE
PH: 5 (ref 5.0–8.0)
Protein, ur: NEGATIVE mg/dL
SPECIFIC GRAVITY, URINE: 1.013 (ref 1.005–1.030)

## 2018-05-12 LAB — COMPREHENSIVE METABOLIC PANEL
ALBUMIN: 3.8 g/dL (ref 3.5–5.0)
ALK PHOS: 104 U/L (ref 38–126)
ALT: 19 U/L (ref 0–44)
AST: 26 U/L (ref 15–41)
Anion gap: 6 (ref 5–15)
BILIRUBIN TOTAL: 0.6 mg/dL (ref 0.3–1.2)
BUN: 26 mg/dL — AB (ref 8–23)
CO2: 29 mmol/L (ref 22–32)
Calcium: 8.9 mg/dL (ref 8.9–10.3)
Chloride: 98 mmol/L (ref 98–111)
Creatinine, Ser: 1.5 mg/dL — ABNORMAL HIGH (ref 0.61–1.24)
GFR calc Af Amer: 53 mL/min — ABNORMAL LOW (ref >60)
GFR calc non Af Amer: 45 mL/min — ABNORMAL LOW (ref >60)
GLUCOSE: 96 mg/dL (ref 70–99)
POTASSIUM: 5.2 mmol/L — AB (ref 3.5–5.1)
Sodium: 133 mmol/L — ABNORMAL LOW (ref 135–145)
TOTAL PROTEIN: 6.6 g/dL (ref 6.5–8.1)

## 2018-05-12 MED ORDER — SODIUM CHLORIDE 0.9 % IV BOLUS
500.0000 mL | Freq: Once | INTRAVENOUS | Status: AC
  Administered 2018-05-12: 500 mL via INTRAVENOUS

## 2018-05-12 NOTE — ED Triage Notes (Signed)
Here today for altered mental status patient keeps on falling asleep started this morning. History of recent MI and angioedema last week. Patient alert answer and following commands appropriate when awake.

## 2018-05-12 NOTE — ED Notes (Signed)
Patient transported to X-ray 

## 2018-05-12 NOTE — Discharge Instructions (Addendum)
You were dehydrated today.  Drink plenty of fluids and follow up with your doctor in the next few days to have your kidney function rechecked.  Stop taking your clonidine.  Get rechecked immediately if you have any new or concerning symptoms.

## 2018-05-12 NOTE — ED Provider Notes (Signed)
[facility] EMERGENCY DEPARTMENT Provider Note   CSN: [id number] Arrival date & time: 05/12/18  1451     History   Chief Complaint Chief Complaint  Patient presents with  . Altered Mental Status    HPI JESSUP OGAS is a 72 y.o. male.  The history is provided by the patient and the spouse. No language interpreter was used.  Altered Mental Status     SYLVESTRE RATHGEBER is a 72 y.o. male who presents to the Emergency Department complaining of AMS. Resents to the emergency department accompanied by his wife for evaluation of altered mental status and lethargy. History is provided by the patient's wife. Level V caveat due to altered mental status. She reports that he was recently discharged from [facility] five days ago. When he was discharged home he did have an episode where he fell asleep in the toilet and struck his head. He was doing well since that time until this morning when she noted that he was lethargic and unable to stay awake. He is on chronic narcotic therapy as well as Neurontin and this is unchanged from his baseline. His wife provides the medications and there is no concern for overdose. On August 5 he was started on clonidine and amlodipine. No reports of fevers, headaches, chest pain, shortness of breath, nausea, vomiting, abdominal pain, dysuria. His wife does think he is experiencing increased urinary output. Patient denies any complaints but he states he is feeling very forgetful and very sleepy. Past Medical History:  Diagnosis Date  . Angioedema    from Benazepril reaction  . Anxiety    takes Celexa  . Arthritis   . Chronic back pain   . CVA (cerebral vascular accident) ([place])   . Enlarged prostate   . History of blood transfusion   . Hypertension   . Neuropathy     Patient Active Problem List   Diagnosis Date Noted  . Gait disturbance, post-stroke   . Parietal lobe infarction ([place]) 05/04/2018  . Chest pain 05/03/2018  . Spastic hemiparesis of  left nondominant side due to acute cerebral infarction ([place])   . Postinfarction angina ([place])   . 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 Elevated troponin   . Dehydration   . 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[place])   . Benign essential HTN   . Hyponatremia   . Non-ST elevation (NSTEMI) myocardial infarction ([place])   . Ventricular tachycardia ([place])   . CVA (cerebral vascular accident) ([place]) 04/25/2018  . Opiate overdose ([place]) 12/10/2017  . Acute kidney injury ([place]) 12/10/2017  . Kidney lesion, native, right 12/10/2017  . Abuse, drug or alcohol ([place]) 10/05/2015  . PNA (pneumonia) 10/05/2015  . Bacteremia due to Staphylococcus aureus 10/05/2015  . Chronic pain syndrome 10/02/2015  . Acute respiratory failure with hypoxia ([place])   . Aspiration pneumonia ([place])   . Hematemesis   . Respiratory failure ([place]) 09/26/2015  . Encephalopathy, unspecified   . Chronic back pain   . Muscle spasm of back   . Altered mental status 08/14/2015  . AKI (acute kidney injury) ([place]) 08/14/2015  . Anemia 08/14/2015  . Altered mental state 08/14/2015  . Pain in left wrist   . Gout attack 06/18/2015  . Septic joint of left wrist ([place]) 06/16/2015  . History of hypertension 06/16/2015  . Leukocytosis 06/16/2015  . Septic joint ([place]) 06/16/2015  . Acute right ankle pain 06/10/2015  . HCAP (healthcare-associated pneumonia) 06/09/2015  . Hypokalemia 06/09/2015  .  Acute renal failure syndrome ([place])   . Acute encephalopathy   . Staphylococcus aureus bacteremia with sepsis ([place]) 06/07/2015  . Constipation 06/06/2015  . Toxic metabolic encephalopathy 29/52/8413  . Hyperkalemia 06/05/2015  . Low back pain with sciatica 04/03/2015  . H/O total hip arthroplasty 04/03/2015  . Idiopathic peripheral neuropathy 09/29/2014  . Chronic pain 09/28/2014  . DDD (degenerative disc disease), lumbar 12/30/2012  . Degenerative arthritis of thoracic spine 12/30/2012  . Localized osteoarthrosis 12/13/2012  . Chronic pain  associated with significant psychosocial dysfunction 12/13/2012  . Polypharmacy 12/13/2012  . Long term current use of opiate analgesic 12/13/2012    Past Surgical History:  Procedure Laterality Date  . CARDIAC CATHETERIZATION  05/03/2018  . COLONOSCOPY     one polyp removed - benign  . ELBOW SURGERY Right   . HERNIA REPAIR     umbilical  . JOINT REPLACEMENT Left    knee, x 2  . JOINT REPLACEMENT Right    hip  . JOINT REPLACEMENT Left    partial shoulder  . LEFT HEART CATH AND CORONARY ANGIOGRAPHY N/A 05/03/2018   Procedure: LEFT HEART CATH AND CORONARY ANGIOGRAPHY;  Surgeon: Martinique, Peter M, MD;  Location: [facility];  Service: Cardiovascular;  Laterality: N/A;  . LOOP RECORDER INSERTION N/A 04/27/2018   Procedure: LOOP RECORDER INSERTION;  Surgeon: Evans Lance, MD;  Location: [facility];  Service: Cardiovascular;  Laterality: N/A;  . SPINAL CORD STIMULATOR INSERTION N/A 09/28/2014   Procedure: LUMBAR SPINAL CORD STIMULATOR INSERTION;  Surgeon: Melina Schools, MD;  Location: [facility];  Service: Orthopedics;  Laterality: N/A;  . TEE WITHOUT CARDIOVERSION N/A 04/27/2018   Procedure: TRANSESOPHAGEAL ECHOCARDIOGRAM (TEE);  Surgeon: Larey Dresser, MD;  Location: [facility] ENDOSCOPY;  Service: Cardiovascular;  Laterality: N/A;  . TONSILLECTOMY    . VASECTOMY          Home Medications    Prior to Admission medications   Medication Sig Start Date End Date Taking? Authorizing Provider  acetaminophen (TYLENOL) 325 MG tablet Take 2 tablets (650 mg total) by mouth every 4 (four) hours as needed for headache or mild pain. 05/06/18  Yes Angiulli, Lavon Paganini, PA-C  alfuzosin (UROXATRAL) 10 MG 24 hr tablet Take 1 tablet (10 mg total) by mouth daily after supper. 05/06/18  Yes Angiulli, Lavon Paganini, PA-C  amLODipine (NORVASC) 10 MG tablet Take 10 mg by mouth daily.   Yes [provider]  aspirin EC 81 MG tablet Take 81 mg by mouth daily.   Yes [provider]    atorvastatin (LIPITOR) 40 MG tablet Take 1 tablet (40 mg total) by mouth daily at 6 PM. 05/06/18  Yes Angiulli, Lavon Paganini, PA-C  celecoxib (CELEBREX) 100 MG capsule Take 100 mg by mouth 2 (two) times daily as needed (for inflammation).   Yes [provider]  cetirizine (ZYRTEC) 10 MG tablet Take 10 mg by mouth 2 (two) times daily.   Yes [provider]  cholecalciferol (VITAMIN D) 1000 units tablet Take 2,000 Units by mouth daily.   Yes [provider]  clopidogrel (PLAVIX) 75 MG tablet Take 1 tablet (75 mg total) by mouth daily. 05/06/18  Yes Angiulli, Lavon Paganini, PA-C  colchicine 0.6 MG tablet Take 0.6 mg by mouth 3 (three) times daily as needed (for gout flares).    Yes [provider]  docusate sodium (COLACE) 100 MG capsule Take 2 capsules (200 mg total) by mouth at bedtime. Patient taking differently: Take 100  mg by mouth at bedtime.  10/02/15  Yes Debbe Odea, MD  gabapentin (NEURONTIN) 600 MG tablet Take 0.5 tablets (300 mg total) by mouth 3 (three) times daily. Patient taking differently: Take 600 mg by mouth 3 (three) times daily.  05/06/18  Yes Angiulli, Lavon Paganini, PA-C  omeprazole (PRILOSEC) 20 MG capsule Take 20 mg by mouth daily.   Yes [provider]  Oxycodone HCl 20 MG TABS Take 1 tablet (20 mg total) by mouth every 8 (eight) hours as needed. Patient taking differently: Take 20 mg by mouth See admin instructions. Take 20 mg by mouth every 4-6 hours as needed for pain 12/12/17  Yes Emokpae, Courage, MD  POTASSIUM PO Take 1 tablet by mouth daily.   Yes [provider]  aspirin 325 MG tablet Take 1 tablet (325 mg total) by mouth daily. 04/29/18   Asencion Noble, MD  pantoprazole (PROTONIX) 40 MG tablet Take 1 tablet (40 mg total) by mouth daily. Patient not taking: Reported on 05/12/2018 05/07/18   Angiulli, Lavon Paganini, PA-C  polyethylene glycol [facility] / GLYCOLAX) packet Take 17 g by mouth daily. Patient not taking: Reported on  05/12/2018 06/12/15   Eugenie Filler, MD  ramelteon (ROZEREM) 8 MG tablet Take 1 tablet (8 mg total) by mouth at bedtime. Patient not taking: Reported on 05/12/2018 05/06/18   Cathlyn Parsons, PA-C  vitamin B-12 (CYANOCOBALAMIN) 100 MCG tablet Take 1 tablet (100 mcg total) by mouth daily. Patient not taking: Reported on 05/12/2018 08/17/15   Caren Griffins, MD    Family History Family History  Problem Relation Age of Onset  . Heart disease Father     Social History Social History   Tobacco Use  . Smoking status: Current Every Day Smoker    Types: E-cigarettes, Cigars  . Smokeless tobacco: Former Systems developer    Types: Snuff  . Tobacco comment: does not use any tobacco products any more  Substance Use Topics  . Alcohol use: Yes    Comment: occ  . Drug use: No     Allergies   Ace inhibitors; Morphine; and Sulfa antibiotics   Review of Systems Review of Systems  All other systems reviewed and are negative.    Physical Exam Updated Vital Signs BP (!) 113/98   Pulse 70   Temp 97.6 F (36.4 C) (Oral)   Resp (!) 21   Ht 5\' 10"  (1.778 m)   Wt 79.8 kg   SpO2 98%   BMI 25.25 kg/m   Physical Exam  Constitutional: He appears well-developed and well-nourished.  HENT:  Head: Normocephalic and atraumatic.  Cardiovascular: Normal rate and regular rhythm.  No murmur heard. Pulmonary/Chest: Effort normal and breath sounds normal. No respiratory distress.  Abdominal: Soft. There is no tenderness. There is no rebound and no guarding.  Musculoskeletal: He exhibits no edema or tenderness.  Neurological:  Lethargic but irascible to verbal stimuli. He begins to speak and follow commands and quickly falls back asleep. Five out of five strength in all four extremities. Pinpoint pupils. No facial asymmetry. Mildly confused. Oriented to person and place.  Skin: Skin is warm and dry.  Psychiatric: He has a normal mood and affect. His behavior is normal.  Nursing note and vitals  reviewed.    ED Treatments / Results  Labs (all labs ordered are listed, but only abnormal results are displayed) Labs Reviewed  COMPREHENSIVE METABOLIC PANEL - Abnormal; Notable for the following components:      Result Value  Sodium 133 (*)    Potassium 5.2 (*)    BUN 26 (*)    Creatinine, Ser 1.50 (*)    GFR calc non Af Amer 45 (*)    GFR calc Af Amer 53 (*)    All other components within normal limits  CBC - Abnormal; Notable for the following components:   RBC 3.83 (*)    Hemoglobin 10.9 (*)    HCT 35.3 (*)    All other components within normal limits  URINALYSIS, ROUTINE W REFLEX MICROSCOPIC    EKG EKG Interpretation  Date/Time:  Wednesday May 12 2018 15:07:03 EDT Ventricular Rate:  57 PR Interval:  218 QRS Duration: 92 QT Interval:  400 QTC Calculation: 389 R Axis:   69 Text Interpretation:  Sinus bradycardia with sinus arrhythmia with 1st degree A-V block Septal infarct , age undetermined Abnormal ECG Confirmed by Quintella Reichert [phone]) on 05/12/2018 4:05:00 PM   Radiology Dg Chest 2 View  Result Date: 05/12/2018 CLINICAL DATA:  Altered mental status, lethargy acute swelling sleep beginning this morning, history of recent MI and angioedema, hypertension, stroke EXAM: CHEST - 2 VIEW COMPARISON:  07/2018 FINDINGS: Loop recorder projects over LEFT mid chest. Intraspinal stimulator again seen. Upper normal heart size. Atherosclerotic calcification aorta. Mediastinal contours and pulmonary vascularity normal. Lungs clear. No pleural effusion or pneumothorax. Bones demineralized with scattered endplate spur formation thoracic spine. LEFT shoulder prosthesis. Old healed mid RIGHT clavicular fracture. IMPRESSION: No acute abnormalities. Electronically Signed   By: Lavonia Dana M.D.   On: 05/12/2018 17:44   Ct Head Wo Contrast  Result Date: 05/12/2018 CLINICAL DATA:  Altered mental status. Recently discharged for myocardial infarction. History of hypertension. EXAM: CT  HEAD WITHOUT CONTRAST TECHNIQUE: Contiguous axial images were obtained from the base of the skull through the vertex without intravenous contrast. COMPARISON:  CT HEAD April 27, 2018 FINDINGS: BRAIN: No intraparenchymal hemorrhage, mass effect nor midline shift. Small area or early RIGHT parietal wedge-like encephalomalacia. No demonstrable RIGHT occipital lobe infarcts. Patchy supratentorial white matter hypodensities within normal range for patient's age, though non-specific are most compatible with chronic small vessel ischemic disease. 4 mm RIGHT external capsule suspected lipoma. No acute large vascular territory infarcts. No abnormal extra-axial fluid collections. Basal cisterns are patent. VASCULAR: Moderate calcific atherosclerosis of the carotid siphons. SKULL: No skull fracture. No significant scalp soft tissue swelling. SINUSES/ORBITS: Paranasal sinuses are well aerated. Perforated nasal septum. Mastoid air cells are well aerated.The included ocular globes and orbital contents are non-suspicious. OTHER: None. IMPRESSION: 1. No acute intracranial process. 2. Subacute to chronic RIGHT parietal/PCA territory infarct. Electronically Signed   By: Elon Alas M.D.   On: 05/12/2018 17:33    Procedures Procedures (including critical care time)  Medications Ordered in ED Medications  sodium chloride 0.9 % bolus 500 mL (0 mLs Intravenous Stopped 05/12/18 1744)     Initial Impression / Assessment and Plan / ED Course  I have reviewed the triage vital signs and the nursing notes.  Pertinent labs & imaging results that were available during my care of the patient were reviewed by me and considered in my medical decision making (see chart for details).     He presents to the emergency department for increased lethargy and change in mental status, history of recent CVA. He is drowsy but arousable on examination with no focal neurologic findings. He does have chronic pain and has been recently  started on clonidine for hypertension. He is normo intensive in the emergency  department. While his clonidine was prescribed starting August 5 it looks like he did not start this medication until a few days ago. No evidence of acute infectious process at this time, lungs are clear on examination, UA without evidence of UTI. On repeat assessment in the emergency department he is more alert and conversant. There is some concern that his chronic pain medications are contributing to his symptoms. He is mildly dehydrated and he was provided with IV fluids. Plan to discharge home with discontinuation of his clonidine. Discussed outpatient follow-up as well as return precautions.   Presentation is not consistent with sepsis, CVA. Final Clinical Impressions(s) / ED Diagnoses   Final diagnoses:  Somnolence  Dehydration    ED Discharge Orders    None       Quintella Reichert, MD 05/12/18 2349

## 2018-05-12 NOTE — ED Provider Notes (Signed)
Patient placed in Quick Look pathway, seen and evaluated   Chief Complaint: unable to stay awake  HPI:  William Jordan is a 72 y.o. male who presents to the ED with hx of multiple medical problems as listed in [place] brought to the ED by family member due to patient sleeping and not being able to stay awake. Patient is taking Percocet  20 mg every 4 hours. Patient d/c home from hospital 05/06/18 after admission for MI.  ROS: Neuro: Altered LOC  Physical Exam:  BP 101/70   Pulse (!) 58   Temp 97.6 F (36.4 C) (Oral)   Resp 20   Ht 5\' 10"  (1.778 m)   Wt 79.8 kg   SpO2 99%   BMI 25.25 kg/m    Gen: No distress  Neuro: patient goes to sleep between questions  Skin: Warm and dry    Initiation of care has begun. The patient has been counseled on the process, plan, and necessity for staying for the completion/evaluation, and the remainder of the medical screening examination    Ashley Murrain, NP 05/12/18 1503    Carmin Muskrat, MD 05/13/18 [phone]

## 2018-05-13 NOTE — Patient Outreach (Addendum)
[facility]) Care Management  05/13/2018  KINGJAMES COURY Feb 06, 1946 [id number]   EMMI-stroke RED ON EMMI ALERT Day #1 Date:05/08/18 Red Alert Reason:questions/problesm with meds? Yes scheduled a follow up appointment? No Filled new prescriptions? No Know how/when to take meds? no   Outreach attempt #3 [facility] RN CM received ADT alert of ED visit on 05/12/18  No answer. [facility] RN CM left HIPAA compliant voicemail message along with CM's contact info at Mr Limpert's listed mobile number No answer at Mr Sayres's home number and unable to leave voicemail message. [facility] RN CM left HIPAA compliant voicemail message along with CM's contact info the alternate listed mobile number  Plan [facility] RN CM pend this patient for case closure Multiple attempts to establish contact with patient without success. No response from letter mailed to patient.   Fusaye Wachtel L. Lavina Hamman, RN, BSN, [place] Coordinator Office number [phone] Mobile number [phone]  Main [facility] number [phone] Fax number [phone]

## 2018-05-14 ENCOUNTER — Other Ambulatory Visit: Payer: Self-pay | Admitting: *Deleted

## 2018-05-15 ENCOUNTER — Emergency Department (HOSPITAL_COMMUNITY): Payer: PPO

## 2018-05-15 ENCOUNTER — Other Ambulatory Visit: Payer: Self-pay

## 2018-05-15 ENCOUNTER — Encounter (HOSPITAL_COMMUNITY): Payer: Self-pay | Admitting: Emergency Medicine

## 2018-05-15 ENCOUNTER — Inpatient Hospital Stay (HOSPITAL_COMMUNITY)
Admission: EM | Admit: 2018-05-15 | Discharge: 2018-05-17 | DRG: 092 | Disposition: A | Discharge status: Home / Self Care | Payer: PPO | Attending: Internal Medicine | Admitting: Internal Medicine

## 2018-05-15 DIAGNOSIS — M549 Dorsalgia, unspecified: Secondary | ICD-10-CM | POA: Diagnosis not present

## 2018-05-15 DIAGNOSIS — E512 Wernicke's encephalopathy: Secondary | ICD-10-CM

## 2018-05-15 DIAGNOSIS — Z7982 Long term (current) use of aspirin: Secondary | ICD-10-CM | POA: Diagnosis not present

## 2018-05-15 DIAGNOSIS — E860 Dehydration: Secondary | ICD-10-CM | POA: Diagnosis present

## 2018-05-15 DIAGNOSIS — G9340 Encephalopathy, unspecified: Secondary | ICD-10-CM | POA: Diagnosis present

## 2018-05-15 DIAGNOSIS — F1010 Alcohol abuse, uncomplicated: Secondary | ICD-10-CM

## 2018-05-15 DIAGNOSIS — R410 Disorientation, unspecified: Secondary | ICD-10-CM | POA: Diagnosis not present

## 2018-05-15 DIAGNOSIS — R569 Unspecified convulsions: Secondary | ICD-10-CM

## 2018-05-15 DIAGNOSIS — F329 Major depressive disorder, single episode, unspecified: Secondary | ICD-10-CM

## 2018-05-15 DIAGNOSIS — F1910 Other psychoactive substance abuse, uncomplicated: Secondary | ICD-10-CM | POA: Diagnosis present

## 2018-05-15 DIAGNOSIS — I10 Essential (primary) hypertension: Secondary | ICD-10-CM

## 2018-05-15 DIAGNOSIS — G8929 Other chronic pain: Secondary | ICD-10-CM | POA: Diagnosis present

## 2018-05-15 DIAGNOSIS — Z8673 Personal history of transient ischemic attack (TIA), and cerebral infarction without residual deficits: Secondary | ICD-10-CM

## 2018-05-15 DIAGNOSIS — F1210 Cannabis abuse, uncomplicated: Secondary | ICD-10-CM

## 2018-05-15 DIAGNOSIS — I2510 Atherosclerotic heart disease of native coronary artery without angina pectoris: Secondary | ICD-10-CM | POA: Diagnosis present

## 2018-05-15 DIAGNOSIS — T426X5A Adverse effect of other antiepileptic and sedative-hypnotic drugs, initial encounter: Secondary | ICD-10-CM | POA: Diagnosis present

## 2018-05-15 DIAGNOSIS — Z8679 Personal history of other diseases of the circulatory system: Secondary | ICD-10-CM

## 2018-05-15 DIAGNOSIS — R29701 NIHSS score 1: Secondary | ICD-10-CM | POA: Diagnosis present

## 2018-05-15 DIAGNOSIS — N400 Enlarged prostate without lower urinary tract symptoms: Secondary | ICD-10-CM | POA: Diagnosis present

## 2018-05-15 DIAGNOSIS — R451 Restlessness and agitation: Secondary | ICD-10-CM | POA: Diagnosis not present

## 2018-05-15 DIAGNOSIS — G929 Unspecified toxic encephalopathy: Secondary | ICD-10-CM

## 2018-05-15 DIAGNOSIS — R609 Edema, unspecified: Secondary | ICD-10-CM

## 2018-05-15 DIAGNOSIS — G92 Toxic encephalopathy: Secondary | ICD-10-CM | POA: Diagnosis present

## 2018-05-15 DIAGNOSIS — F17290 Nicotine dependence, other tobacco product, uncomplicated: Secondary | ICD-10-CM | POA: Diagnosis present

## 2018-05-15 DIAGNOSIS — M7989 Other specified soft tissue disorders: Secondary | ICD-10-CM

## 2018-05-15 DIAGNOSIS — I252 Old myocardial infarction: Secondary | ICD-10-CM | POA: Diagnosis not present

## 2018-05-15 DIAGNOSIS — F419 Anxiety disorder, unspecified: Secondary | ICD-10-CM | POA: Diagnosis present

## 2018-05-15 DIAGNOSIS — G629 Polyneuropathy, unspecified: Secondary | ICD-10-CM | POA: Diagnosis present

## 2018-05-15 DIAGNOSIS — I110 Hypertensive heart disease with heart failure: Secondary | ICD-10-CM | POA: Diagnosis not present

## 2018-05-15 DIAGNOSIS — I5030 Unspecified diastolic (congestive) heart failure: Secondary | ICD-10-CM | POA: Diagnosis not present

## 2018-05-15 DIAGNOSIS — Z7902 Long term (current) use of antithrombotics/antiplatelets: Secondary | ICD-10-CM | POA: Diagnosis not present

## 2018-05-15 DIAGNOSIS — G40909 Epilepsy, unspecified, not intractable, without status epilepticus: Secondary | ICD-10-CM | POA: Diagnosis present

## 2018-05-15 DIAGNOSIS — Z79899 Other long term (current) drug therapy: Secondary | ICD-10-CM

## 2018-05-15 DIAGNOSIS — I69354 Hemiplegia and hemiparesis following cerebral infarction affecting left non-dominant side: Secondary | ICD-10-CM

## 2018-05-15 DIAGNOSIS — F32A Depression, unspecified: Secondary | ICD-10-CM

## 2018-05-15 DIAGNOSIS — R4182 Altered mental status, unspecified: Secondary | ICD-10-CM | POA: Diagnosis present

## 2018-05-15 LAB — URINALYSIS, COMPLETE (UACMP) WITH MICROSCOPIC
BILIRUBIN URINE: NEGATIVE
Bacteria, UA: NONE SEEN
Glucose, UA: NEGATIVE mg/dL
HGB URINE DIPSTICK: NEGATIVE
Ketones, ur: NEGATIVE mg/dL
LEUKOCYTES UA: NEGATIVE
Nitrite: NEGATIVE
PROTEIN: NEGATIVE mg/dL
Specific Gravity, Urine: 1.005 (ref 1.005–1.030)
pH: 7 (ref 5.0–8.0)

## 2018-05-15 LAB — CBG MONITORING, ED: Glucose-Capillary: 88 mg/dL (ref 70–99)

## 2018-05-15 LAB — BASIC METABOLIC PANEL
Anion gap: 11 (ref 5–15)
BUN: 21 mg/dL (ref 8–23)
CHLORIDE: 94 mmol/L — AB (ref 98–111)
CO2: 23 mmol/L (ref 22–32)
Calcium: 8.4 mg/dL — ABNORMAL LOW (ref 8.9–10.3)
Creatinine, Ser: 1.16 mg/dL (ref 0.61–1.24)
GFR calc non Af Amer: >60 mL/min (ref >60)
Glucose, Bld: 95 mg/dL (ref 70–99)
Potassium: 4.9 mmol/L (ref 3.5–5.1)
SODIUM: 128 mmol/L — AB (ref 135–145)

## 2018-05-15 LAB — CBC
HCT: 30.7 % — ABNORMAL LOW (ref 39.0–52.0)
HEMOGLOBIN: 10 g/dL — AB (ref 13.0–17.0)
MCH: 29.2 pg (ref 26.0–34.0)
MCHC: 32.6 g/dL (ref 30.0–36.0)
MCV: 89.8 fL (ref 78.0–100.0)
Platelets: 145 10*3/uL — ABNORMAL LOW (ref 150–400)
RBC: 3.42 MIL/uL — AB (ref 4.22–5.81)
RDW: 13.8 % (ref 11.5–15.5)
WBC: 7.1 10*3/uL (ref 4.0–10.5)

## 2018-05-15 LAB — I-STAT CHEM 8, ED
BUN: 26 mg/dL — AB (ref 8–23)
CREATININE: 1.2 mg/dL (ref 0.61–1.24)
Calcium, Ion: 1.11 mmol/L — ABNORMAL LOW (ref 1.15–1.40)
Chloride: 95 mmol/L — ABNORMAL LOW (ref 98–111)
Glucose, Bld: 91 mg/dL (ref 70–99)
HEMATOCRIT: 32 % — AB (ref 39.0–52.0)
Hemoglobin: 10.9 g/dL — ABNORMAL LOW (ref 13.0–17.0)
Potassium: 4.8 mmol/L (ref 3.5–5.1)
Sodium: 131 mmol/L — ABNORMAL LOW (ref 135–145)
TCO2: 27 mmol/L (ref 22–32)

## 2018-05-15 LAB — I-STAT VENOUS BLOOD GAS, ED
ACID-BASE EXCESS: 3 mmol/L — AB (ref 0.0–2.0)
ACID-BASE EXCESS: 5 mmol/L — AB (ref 0.0–2.0)
Bicarbonate: 27.6 mmol/L (ref 20.0–28.0)
Bicarbonate: 31.5 mmol/L — ABNORMAL HIGH (ref 20.0–28.0)
O2 SAT: 83 %
O2 SAT: 36 %
PH VEN: 7.379 (ref 7.250–7.430)
TCO2: 29 mmol/L (ref 22–32)
TCO2: 33 mmol/L — ABNORMAL HIGH (ref 22–32)
pCO2, Ven: 43.2 mmHg — ABNORMAL LOW (ref 44.0–60.0)
pCO2, Ven: 53.3 mmHg (ref 44.0–60.0)
pH, Ven: 7.413 (ref 7.250–7.430)
pO2, Ven: 48 mmHg — ABNORMAL HIGH (ref 32.0–45.0)
pO2, Ven: 22 mmHg — CL (ref 32.0–45.0)

## 2018-05-15 LAB — TSH: TSH: 2.223 u[IU]/mL (ref 0.350–4.500)

## 2018-05-15 LAB — VITAMIN B12: VITAMIN B 12: 270 pg/mL (ref 180–914)

## 2018-05-15 LAB — HEPATIC FUNCTION PANEL
ALBUMIN: 3.7 g/dL (ref 3.5–5.0)
ALK PHOS: 96 U/L (ref 38–126)
ALT: 22 U/L (ref 0–44)
AST: 47 U/L — AB (ref 15–41)
Bilirubin, Direct: 0.2 mg/dL (ref 0.0–0.2)
Indirect Bilirubin: 0.7 mg/dL (ref 0.3–0.9)
TOTAL PROTEIN: 6.6 g/dL (ref 6.5–8.1)
Total Bilirubin: 0.9 mg/dL (ref 0.3–1.2)

## 2018-05-15 LAB — I-STAT CG4 LACTIC ACID, ED: Lactic Acid, Venous: 0.87 mmol/L (ref 0.5–1.9)

## 2018-05-15 LAB — AMMONIA: Ammonia: 44 umol/L — ABNORMAL HIGH (ref 9–35)

## 2018-05-15 MED ORDER — THIAMINE HCL 100 MG/ML IJ SOLN: 500.0000 mg | Freq: Three times a day (TID) | INTRAVENOUS | Status: DC

## 2018-05-15 MED ORDER — CLOPIDOGREL BISULFATE 75 MG PO TABS
75.0000 mg | ORAL_TABLET | Freq: Every day | ORAL | Status: DC
  Administered 2018-05-16 – 2018-05-17 (×2): 75 mg via ORAL
  Filled 2018-05-15 (×2): qty 1

## 2018-05-15 MED ORDER — DOCUSATE SODIUM 100 MG PO CAPS
200.0000 mg | ORAL_CAPSULE | Freq: Every day | ORAL | Status: DC
  Administered 2018-05-15 – 2018-05-16 (×2): 200 mg via ORAL
  Filled 2018-05-15 (×2): qty 2

## 2018-05-15 MED ORDER — THIAMINE HCL 100 MG/ML IJ SOLN
500.0000 mg | INTRAVENOUS | Status: DC
  Filled 2018-05-15: qty 5

## 2018-05-15 MED ORDER — THIAMINE HCL 100 MG/ML IJ SOLN: 250.0000 mg | INTRAMUSCULAR | Status: DC

## 2018-05-15 MED ORDER — DIVALPROEX SODIUM 250 MG PO DR TAB
500.0000 mg | DELAYED_RELEASE_TABLET | Freq: Two times a day (BID) | ORAL | Status: DC
  Administered 2018-05-16 – 2018-05-17 (×4): 500 mg via ORAL
  Filled 2018-05-15 (×4): qty 2

## 2018-05-15 MED ORDER — SODIUM CHLORIDE 0.9 % IV BOLUS
1000.0000 mL | Freq: Once | INTRAVENOUS | Status: AC
  Administered 2018-05-15: 1000 mL via INTRAVENOUS

## 2018-05-15 MED ORDER — ALFUZOSIN HCL ER 10 MG PO TB24
10.0000 mg | ORAL_TABLET | Freq: Every day | ORAL | Status: DC
  Administered 2018-05-16: 10 mg via ORAL
  Filled 2018-05-15 (×2): qty 1

## 2018-05-15 MED ORDER — THIAMINE HCL 100 MG/ML IJ SOLN
500.0000 mg | Freq: Three times a day (TID) | INTRAVENOUS | Status: DC
  Administered 2018-05-15 – 2018-05-17 (×5): 500 mg via INTRAVENOUS
  Filled 2018-05-15 (×6): qty 5

## 2018-05-15 MED ORDER — VITAMIN B-12 100 MCG PO TABS
100.0000 ug | ORAL_TABLET | Freq: Every day | ORAL | Status: DC
  Administered 2018-05-16 – 2018-05-17 (×2): 100 ug via ORAL
  Filled 2018-05-15 (×2): qty 1

## 2018-05-15 MED ORDER — VALPROATE SODIUM 500 MG/5ML IV SOLN
1500.0000 mg | Freq: Once | INTRAVENOUS | Status: AC
  Administered 2018-05-15: 1500 mg via INTRAVENOUS
  Filled 2018-05-15: qty 15

## 2018-05-15 MED ORDER — ASPIRIN EC 81 MG PO TBEC
81.0000 mg | DELAYED_RELEASE_TABLET | Freq: Every day | ORAL | Status: DC
  Administered 2018-05-16 – 2018-05-17 (×2): 81 mg via ORAL
  Filled 2018-05-15 (×2): qty 1

## 2018-05-15 MED ORDER — CITALOPRAM HYDROBROMIDE 10 MG PO TABS
20.0000 mg | ORAL_TABLET | Freq: Every day | ORAL | Status: DC
  Administered 2018-05-15 – 2018-05-17 (×3): 20 mg via ORAL
  Filled 2018-05-15 (×3): qty 2

## 2018-05-15 MED ORDER — ATORVASTATIN CALCIUM 40 MG PO TABS
40.0000 mg | ORAL_TABLET | Freq: Every day | ORAL | Status: DC
  Administered 2018-05-16: 40 mg via ORAL
  Filled 2018-05-15: qty 1

## 2018-05-15 MED ORDER — HYDROMORPHONE HCL 1 MG/ML IJ SOLN
0.5000 mg | Freq: Once | INTRAMUSCULAR | Status: AC
  Administered 2018-05-15: 0.5 mg via INTRAVENOUS
  Filled 2018-05-15: qty 1

## 2018-05-15 MED ORDER — AMLODIPINE BESYLATE 10 MG PO TABS
10.0000 mg | ORAL_TABLET | Freq: Every day | ORAL | Status: DC
  Administered 2018-05-16 – 2018-05-17 (×2): 10 mg via ORAL
  Filled 2018-05-15 (×2): qty 1

## 2018-05-15 MED ORDER — OXYCODONE HCL 5 MG PO TABS
20.0000 mg | ORAL_TABLET | Freq: Three times a day (TID) | ORAL | Status: DC | PRN
  Administered 2018-05-15 – 2018-05-17 (×4): 20 mg via ORAL
  Filled 2018-05-15 (×6): qty 4

## 2018-05-15 MED ORDER — ENOXAPARIN SODIUM 40 MG/0.4ML ~~LOC~~ SOLN
40.0000 mg | SUBCUTANEOUS | Status: DC
  Administered 2018-05-15 – 2018-05-16 (×2): 40 mg via SUBCUTANEOUS
  Filled 2018-05-15 (×2): qty 0.4

## 2018-05-15 NOTE — ED Notes (Signed)
ED Provider at bedside. 

## 2018-05-15 NOTE — ED Notes (Signed)
Pt transported to vascular study  

## 2018-05-15 NOTE — ED Notes (Signed)
Per vascular, pt had 800 mL of urine output while over at study. Vascular did not collect prior to flushing. Will attempt to collect specimen upon pt returning back to room.

## 2018-05-15 NOTE — ED Triage Notes (Addendum)
Pt arrives via [place] ems for c/o bilateral foot pain and altered mental status, takes pain meds regularly. Ems reports that patients pupils were pinpoint, O2 sats were low when fire got to the scene, pt was placed on O2 and mental status improved. Pt also now reports dizziness that has been off and on for the past 10 years and is flaring up today, began around 0400. Pt a/ox4. Speech clear, face symmetrical, able to move all limbs freely.

## 2018-05-15 NOTE — ED Notes (Signed)
Wife called room and pt answered. Pt spoke with wife and told her that he will call once disposition is set.

## 2018-05-15 NOTE — ED Notes (Signed)
Pt made contact with wife and told her his admission status.

## 2018-05-15 NOTE — Progress Notes (Signed)
VASCULAR LAB PRELIMINARY  PRELIMINARY  PRELIMINARY  PRELIMINARY  Left lower extremity venous duplex completed.    Preliminary report:  There is no DVT or SVT noted in the left lower extremity. Interstitial fluid noted throughout the left calf.  Called report to Margarita Mail, PA-C  [facility], [facility], RVT 05/15/2018, 1:37 PM
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
## 2018-05-15 NOTE — ED Notes (Signed)
Pt a/ox4 but states he is confused, pt continues to get out of wheelchair despite constant reminders to stay seated due to being unsteady on his feet.

## 2018-05-15 NOTE — H&P (Addendum)
Date: 05/15/2018               Patient Name:  William Jordan MRN: [id number]  DOB: 09-04-1946 Age / Sex: 72 y.o., male   PCP: Townsend Roger, MD         Medical Service: Internal Medicine Teaching Service         Attending Physician: Dr. Oval Linsey, MD    First Contact: Dr. Molli Hazard Pager: [phone]  Second Contact: Dr. Kathi Ludwig Pager: [phone]       After Hours (After 5p/  First Contact Pager: [phone]  weekends / holidays): Second Contact Pager: [phone]   Chief Complaint: Altered Mental Status  History of Present Illness: William Jordan is a 72 year old male with a history significant for a recent PCA stroke with mild residual left-sided weakness, alcohol abuse, chronic back pain (spinal stimulator, cannot get MRI), depression, diastolic heart failure who presented with altered mental status.  He has had multiple prior episodes of encephalopathy for varying reasons dating back to 2016. His most recent hospitalization was on 04/28/18 where he was found to have multiple small cortical infarctions in the right parietal and occipital lobes.  Despite diffuse atherosclerotic disease and multiple intra-and extracranial vessels, neurologist felt imaging pattern could be consistent with an embolic stroke.  He was started on dual antiplatelet agents. He had no arrhythmias on cardiac monitor.  No structural cardiac defects or atrial/ventricular clots on transthoracic and transesophageal echocardiogram.  A loop recorder was placed in the subcutaneous tissues of the pectoral region by cardiology to assess for any occult atrial arrhythmias. His neurologic deficits recovered nearly completely by time of discharge with full strength of his left side. He was discharged to an inpatient rehab and sent home a couple days later. He was doing well while in rehab but developed complaints of chest pain on 8/20 and elevated troponin consistent with non-STEMI (peak 3.09), transient episodes of  nonsustained VT while in Rehab. Cardiology was consulted and recommended that he go for cardiac cath. Discharged from rehab back to inpatient bed and taken for cardiac cath. Underwent cardiac cath noted and found to have nonobstructive CAD, normal LV function, and normal LVEDP. No culprit lesion noted on cath. He was continued on medical management ASA. The patient was readmitted back to Inpatient Rehabilitation services to resume inpatient rehab and then discharged on 05/06/18.  His celexa was discontinued during his last admission. He was also recently started on Keppra for seizure activity noted during past admission. Per his wife, he is not taking this medication at home.   Per his wife, since his discharge from the rehab center he has had intermittent confusion. She states that he has trouble with doing routine tasks such as cooking and dressing himself. She has also noticed that he will get lower extremity edema that will resolve whenever his confusion resolves. She also states that yesterday he seemed to have a fever although she was unable to check his temperature. She notes that she too was not feeling well yesterday. She said that this morning he had trouble "buttoning his paints" and became very frustrated. He then asked her to call EMS. She says that he has "been very worried about his confusion" over the last several days and "is really scared".  William Jordan states that he has felt confused but is unable to say when this all began. He states that he cannot really remember what he did yesterday. He says that he  was confused today and his wife called the EMS but cannot remember what in particular precipitated her calling. He denies current chest pain, SOB, abdominal pain, headache, weakness, numbness. He does state that he has had increased urgency and dysuria today.   Per the neurology note, he has a long history of alcohol abuse and quit alcohol a week or 2 ago.    Meds:  Current Meds    Medication Sig  . alfuzosin (UROXATRAL) 10 MG 24 hr tablet Take 1 tablet (10 mg total) by mouth daily after supper.  Marland Kitchen amLODipine (NORVASC) 10 MG tablet Take 10 mg by mouth daily.  Marland Kitchen atorvastatin (LIPITOR) 40 MG tablet Take 1 tablet (40 mg total) by mouth daily at 6 PM.  . celecoxib (CELEBREX) 100 MG capsule Take 100 mg by mouth 2 (two) times daily as needed (for inflammation).     Allergies: Allergies as of 05/15/2018 - Review Complete 05/15/2018  Allergen Reaction Noted  . Ace inhibitors Shortness Of Breath, Swelling, and Other (See Comments) 04/18/2018  . Morphine Other (See Comments) 06/23/2017  . Sulfa antibiotics Other (See Comments) 04/25/2018   Past Medical History:  Diagnosis Date  . Angioedema    from Benazepril reaction  . Anxiety    takes Celexa  . Arthritis   . Chronic back pain   . CVA (cerebral vascular accident) ([place])   . Enlarged prostate   . History of blood transfusion   . Hypertension   . Neuropathy     Family History: Unknown  Social History: Alcohol abuse  Review of Systems: A complete ROS was negative except as per HPI.  Physical Exam: Blood pressure 140/73, pulse 66, temperature 98.3 F (36.8 C), temperature source Oral, resp. rate (!) 9, SpO2 98 %. Physical Exam  Constitutional: He is oriented to person, place, and time. He appears well-developed and well-nourished.  HENT:  Head: Normocephalic and atraumatic.  Eyes: EOM are normal.  Cardiovascular: Normal rate and regular rhythm.  Pulmonary/Chest: Effort normal and breath sounds normal.  Abdominal: Soft. Bowel sounds are normal.  Musculoskeletal: He exhibits edema (Right LE has non-pitting edema and erythema up to upper calf.). He exhibits no tenderness (Non-tender to palpation of LE bilaerally).  Neurological: He is alert and oriented to person, place, and time. No cranial nerve deficit or sensory deficit. He exhibits normal muscle tone. Coordination normal.  Skin: Skin is warm and dry.   Psychiatric:  He is unable to answer my questions about what occurred yesterday and today. He is able to answer questions regarding review of systems.   Nursing note and vitals reviewed.   EKG: personally reviewed my interpretation. Normal Sinus Rhythm.   CXR: personally reviewed my interpretation is shallow lung fields without acute pulmonary abnormalities.   Assessment & Plan by Problem: Principal Problem:   Encephalopathy Active Problems:   Left leg swelling   Alcohol abuse   Depression   History of CVA (cerebrovascular accident)   Chronic pain   Essential hypertension  William Jordan is a 72 year old male with a history significant for a recent PCA stroke, alcohol abuse, chronic back pain (spinal stimulator, cannot get MRI), depression, diastolic heart failure who presented with altered mental status. His acute encephalopathy may be due to Wernicke's given history of alcohol abuse. His symptoms may also be due to worsening of his underlying depression after discontinuation of Celexa. Additionally, he may have a medication-induced (keppra) behavioral change although it is unclear whether he was actually taking this medication at  home per his wife. An underlying infectious process should be considered given recent subjective fevers at home. However he does not have signs/symptoms that would suggest an underlying infection (he has remained afebrile throughout his hospital stay, negative CXR, UA without signs of UTI, and unremarkable physical exam). Patient does have a history of CVA but it is unlikely that he has had another acute stroke given his normal neurologic exam. Of note, he has left leg edema of unclear etiology. His symptoms may be secondary to venous insufficiency vs gout although his leg is non-tender to palpation. He had an [place] of his left leg which showed no signs of DVT.  #Acute encephalopathy: Wernicke's vs medication-induced AMS vs worsening depression - Head CT shows stable  appearance of subacute vs chronic infarct of posterior parietal region, chronic microvascular disease; MRI contraindicated because of spinal stimulator. - Discontinued Keppra - Depakote 1500 mg IV once; Depakote 500 mg BID starting this morning.  - Celexa 20 mg PO daily - High Dose Thiamine IV 500 mg x2 days, followed by 250 mg daily IV for 5 days. - B12 and TSH within normal limits - Urinalysis unremarkable - RPR pending - Blood Cultures pending  #Left leg edema: Venous insuffiencey vs gout (less likely because patient's leg is non-tender to palpation)  - US Dopplers negative for DVT - Continue to monitor  #Alcohol abuse - CIWA monitoring  #History of CVA - Plavix 5 mg QD, atorvastatin 40 mg QD  #Depression - Restart Celexa 20 mg PO daily  #HTN - Amlodipine 10 mg QD   Dispo: Admit patient to Inpatient with expected length of stay greater than 2 midnights.  Signed: Carroll Sage, MD 05/15/2018, 10:26 PM  Pager: [phone]

## 2018-05-15 NOTE — ED Notes (Signed)
Pt ambulated on pulse ox with this RN. O2 sat maintained from 93-96% on room air.

## 2018-05-15 NOTE — Consult Note (Addendum)
Neurology Consultation  Reason for Consult: Foot pain, behavioral changes Referring Physician: Dr. Melina Copa  CC: Behavioral changes, aggressiveness, altered mental status, foot pain  History is obtained from: Patient, chart  HPI: William Jordan is a 72 y.o. male who was a past medical history of recent right PCA stroke with mild residual left-sided weakness, chronic back pain (spinal stimulator, can not get MRI), hypertension, neuropathy, anxiety and depression who was on Celexa until her recent while ago when it was discontinued, recently started on Keppra for seizure activity noted during past admission, presented to the emergency room upon his wife's insistence for behavioral changes. According to the report obtained from the ED providers, the patient started having some pain in his left foot this morning which he said was very severe.  He is on pain medications at baseline and took his regular dose of oxycodone but had significant pain.  Came into the emergency room and while in triage was wondering about the halls looking confused.  History was obtained over the phone by the ED providers from his wife who said that he has become very aggressive and at times unreasonable in his behavior. There have been no violent outbursts but he has been more aggressive than his usual self. He was evaluated in the emergency room for possible DVT in the leg and that was ruled out. There was also concern of his antidepressants being stopped causing some of his symptoms. There has been no seizure activity reported. He has a long history of alcohol abuse and quit alcohol a week or 2 ago. Denies any focal tingling numbness or weakness.  Complains of overall generalized body aches and pains.  Denies having noted any psychological changes himself his wife is been reporting that he is not being like himself.  Reports of not having any concept of time and orientation. Last known well is unclear No TPA given due to no focal  deficits Premorbid modified Rankin scale-2  ROS:ROS was performed and is negative except as noted in the HPI.   Past Medical History:  Diagnosis Date  . Angioedema    from Benazepril reaction  . Anxiety    takes Celexa  . Arthritis   . Chronic back pain   . CVA (cerebral vascular accident) ([place])   . Enlarged prostate   . History of blood transfusion   . Hypertension   . Neuropathy    Family History  Problem Relation Age of Onset  . Heart disease Father      Social History:   reports that he has been smoking e-cigarettes and cigars. He has quit using smokeless tobacco.  His smokeless tobacco use included snuff. He reports that he drinks alcohol. He reports that he does not use drugs. Recently quit alcohol Had alcohol consumption that was above normal.  Says he used to drink enough to get drunk because of pain.  Medications  Current Facility-Administered Medications:  .  oxyCODONE (Oxy IR/ROXICODONE) immediate release tablet 20 mg, 20 mg, Oral, Q8H PRN, Harris, Abigail, PA-C, 20 mg at 05/15/18 1243  Current Outpatient Medications:  .  alfuzosin (UROXATRAL) 10 MG 24 hr tablet, Take 1 tablet (10 mg total) by mouth daily after supper., Disp: 30 tablet, Rfl: 0 .  amLODipine (NORVASC) 10 MG tablet, Take 10 mg by mouth daily., Disp: , Rfl:  .  atorvastatin (LIPITOR) 40 MG tablet, Take 1 tablet (40 mg total) by mouth daily at 6 PM., Disp: 30 tablet, Rfl: 3 .  celecoxib (CELEBREX) 100  MG capsule, Take 100 mg by mouth 2 (two) times daily as needed (for inflammation)., Disp: , Rfl:  .  acetaminophen (TYLENOL) 325 MG tablet, Take 2 tablets (650 mg total) by mouth every 4 (four) hours as needed for headache or mild pain., Disp: , Rfl:  .  aspirin 325 MG tablet, Take 1 tablet (325 mg total) by mouth daily., Disp: 30 tablet, Rfl: 3 .  aspirin EC 81 MG tablet, Take 81 mg by mouth daily., Disp: , Rfl:  .  cetirizine (ZYRTEC) 10 MG tablet, Take 10 mg by mouth 2 (two) times daily., Disp: ,  Rfl:  .  cholecalciferol (VITAMIN D) 1000 units tablet, Take 2,000 Units by mouth daily., Disp: , Rfl:  .  clopidogrel (PLAVIX) 75 MG tablet, Take 1 tablet (75 mg total) by mouth daily., Disp: 30 tablet, Rfl: 3 .  colchicine 0.6 MG tablet, Take 0.6 mg by mouth 3 (three) times daily as needed (for gout flares). , Disp: , Rfl:  .  docusate sodium (COLACE) 100 MG capsule, Take 2 capsules (200 mg total) by mouth at bedtime. (Patient taking differently: Take 100 mg by mouth at bedtime. ), Disp: 30 capsule, Rfl: 0 .  gabapentin (NEURONTIN) 600 MG tablet, Take 0.5 tablets (300 mg total) by mouth 3 (three) times daily. (Patient taking differently: Take 600 mg by mouth 3 (three) times daily. ), Disp: 90 tablet, Rfl: 0 .  omeprazole (PRILOSEC) 20 MG capsule, Take 20 mg by mouth daily., Disp: , Rfl:  .  Oxycodone HCl 20 MG TABS, Take 1 tablet (20 mg total) by mouth every 8 (eight) hours as needed. (Patient taking differently: Take 20 mg by mouth See admin instructions. Take 20 mg by mouth every 4-6 hours as needed for pain), Disp: 1 tablet, Rfl: 0 .  pantoprazole (PROTONIX) 40 MG tablet, Take 1 tablet (40 mg total) by mouth daily. (Patient not taking: Reported on 05/12/2018), Disp: 30 tablet, Rfl: 0 .  polyethylene glycol (MIRALAX / GLYCOLAX) packet, Take 17 g by mouth daily. (Patient not taking: Reported on 05/12/2018), Disp: 14 each, Rfl: 0 .  POTASSIUM PO, Take 1 tablet by mouth daily., Disp: , Rfl:  .  ramelteon (ROZEREM) 8 MG tablet, Take 1 tablet (8 mg total) by mouth at bedtime. (Patient not taking: Reported on 05/12/2018), Disp: 30 tablet, Rfl: 0 .  vitamin B-12 (CYANOCOBALAMIN) 100 MCG tablet, Take 1 tablet (100 mcg total) by mouth daily. (Patient not taking: Reported on 05/12/2018), Disp: 30 tablet, Rfl: 1  Facility-Administered Medications Ordered in Other Encounters:  .  fentaNYL (SUBLIMAZE) injection, , , PRN, [place], Peter M, MD, 25 mcg at 05/03/18 1336 .  Heparin (Porcine) in NaCl 1000-0.9 UT/500ML-%  SOLN, , , PRN, [place], Peter M, MD, 500 mL at 05/03/18 1331 .  Heparin (Porcine) in NaCl 1000-0.9 UT/500ML-% SOLN, , , PRN, [place], Peter M, MD, 500 mL at 05/03/18 1332 .  heparin injection, , , PRN, [place], Peter M, MD, 4,000 Units at 05/03/18 1345 .  iopamidol (ISOVUE-370) 76 % injection, , , PRN, [place], Peter M, MD, 85 mL at 05/03/18 1357 .  lidocaine (PF) (XYLOCAINE) 1 % injection, , , PRN, [place], Peter M, MD, 2 mL at 05/03/18 1342 .  midazolam (VERSED) injection, , , PRN, [place], Peter M, MD, 1 mg at 05/03/18 1336 .  Radial Cocktail/Verapamil only, , , PRN, [place], Peter M, MD  Exam: Current vital signs: BP (!) 148/77   Pulse 66   Temp 98.3 F (36.8  C) (Oral)   Resp 16   SpO2 99%  Vital signs in last 24 hours: Temp:  [98.3 F (36.8 C)] 98.3 F (36.8 C) (08/31 1136) Pulse Rate:  [66-95] 66 (08/31 1845) Resp:  [12-22] 16 (08/31 1845) BP: (111-148)/(64-86) 148/77 (08/31 1845) SpO2:  [90 %-99 %] 99 % (08/31 1845)  GENERAL: Awake, alert in NAD HEENT: - Normocephalic and atraumatic, dry mm, no LN++, no Thyromegally LUNGS - Clear to auscultation bilaterally with no wheezes CV - S1S2 RRR, no m/r/g, equal pulses bilaterally. ABDOMEN - Soft, nontender, nondistended with normoactive BS Ext: warm, well perfused, intact peripheral pulses, no edema  NEURO:  Mental Status: AA&Ox2  Language: speech is non-dysarthric.  Naming, repetition, fluency, and comprehension intact.  Reduced attention concentration Cranial Nerves: PERRL. EOMI, visual fields full, no facial asymmetry, facial sensation intact, hearing intact, tongue/uvula/soft palate midline, normal sternocleidomastoid and trapezius muscle strength. No evidence of tongue atrophy or fibrillations Motor: 4/5 left upper extremity, 5/5 rest of the extremities. Tone: is normal and bulk is normal Sensation- Intact to light touch bilaterally, no extinction on double simultaneous simulation Coordination: FTN intact bilaterally, no ataxia in  BLE. Gait-deferred at this time  NIHSS-1 for left upper extremity   Labs I have reviewed labs in epic and the results pertinent to this consultation are:  CBC    Component Value Date/Time   WBC 7.1 05/15/2018 1141   RBC 3.42 (L) 05/15/2018 1141   HGB 10.9 (L) 05/15/2018 1310   HCT 32.0 (L) 05/15/2018 1310   HCT 28.0 (L) 08/15/2015 0016   PLT 145 (L) 05/15/2018 1141   MCV 89.8 05/15/2018 1141   MCH 29.2 05/15/2018 1141   MCHC 32.6 05/15/2018 1141   RDW 13.8 05/15/2018 1141   LYMPHSABS 1.8 05/05/2018 0551   MONOABS 0.9 05/05/2018 0551   EOSABS 0.3 05/05/2018 0551   BASOSABS 0.1 05/05/2018 0551   CMP     Component Value Date/Time   NA 131 (L) 05/15/2018 1310   K 4.8 05/15/2018 1310   CL 95 (L) 05/15/2018 1310   CO2 23 05/15/2018 1141   GLUCOSE 91 05/15/2018 1310   BUN 26 (H) 05/15/2018 1310   CREATININE 1.20 05/15/2018 1310   CALCIUM 8.4 (L) 05/15/2018 1141   PROT 6.6 05/15/2018 1257   ALBUMIN 3.7 05/15/2018 1257   AST 47 (H) 05/15/2018 1257   ALT 22 05/15/2018 1257   ALKPHOS 96 05/15/2018 1257   BILITOT 0.9 05/15/2018 1257   GFRNONAA >60 05/15/2018 1141   GFRAA >60 05/15/2018 1141    Lipid Panel     Component Value Date/Time   CHOL 157 04/26/2018 0355   TRIG 123 04/26/2018 0355   HDL 60 04/26/2018 0355   CHOLHDL 2.6 04/26/2018 0355   VLDL 25 04/26/2018 0355   LDLCALC 72 04/26/2018 0355   Imaging I have reviewed the images obtained: CT-scan of the brain IMPRESSION: 1. Stable appearance of subacute versus chronic infarct of the RIGHT posterior parietal region. 2.  No evidence for acute intracranial abnormality. 3. Chronic microvascular disease.  Assessment:  72 year old man with past medical history of recent right PCA stroke with mild residual left hemiparesis, seizures that started after for which she is on Keppra, anxiety and depression for which she was on Celexa that was recently discontinued, history of alcohol abuse, chronic back pain on  medications, hypertension and neuropathy presenting for altered mental status and behavioral changes according to his wife. He also complained of some foot pain and discomfort that have  since resolved and had fluctuating symptoms. I think his symptoms might be attributable to either his long-term alcohol use-Wernicke's encephalopathy or side effect of Keppra which can cause behavioral changes. At this time, I would recommend changes to his antiepileptics as well as antidepressant regimen below.  Impression: Evaluate for Wernicke's encephalopathy Evaluate for worsening of underlying depression and anxiety Evaluate for possible side effect of Keppra (can cause aggression and behavioral changes)  Recommendations: -Discontinue Keppra -Load with Depakote 1500 mg IV x1 (ordered) -Start on Depakote 500 twice daily from tomorrow morning.  Depakote has mood stabilizing effects as well and might be helpful in addition to his antidepressant. (ordered) -Start Celexa 20mg  PO daily. (ordered) -Consider psychiatry consult at some point in this admission -High dose Thiamine IV for possible Wernickes -500 mg IV TID for 2 days, followed by 250 daily IV for 5 days. -Supplement other B vitamins orally -Check B12, TSH and RPR -Continue home antiplatelets and statin -Would have like to evaluate further with an MRI but due to his spinal stability, that is not going to be possible. -PT OT ST -Seizure precautions -Can consider low dose seroquel for any acute behavioral changes such as aggression.  Neurology service will follow with you.  -- Amie Portland, MD [facility] Pager: [phone] If 7pm to 7am, please call on call as listed on AMION.

## 2018-05-16 DIAGNOSIS — G92 Toxic encephalopathy: Secondary | ICD-10-CM

## 2018-05-16 DIAGNOSIS — I5030 Unspecified diastolic (congestive) heart failure: Secondary | ICD-10-CM

## 2018-05-16 DIAGNOSIS — G9340 Encephalopathy, unspecified: Secondary | ICD-10-CM

## 2018-05-16 DIAGNOSIS — I69354 Hemiplegia and hemiparesis following cerebral infarction affecting left non-dominant side: Secondary | ICD-10-CM

## 2018-05-16 DIAGNOSIS — F1210 Cannabis abuse, uncomplicated: Secondary | ICD-10-CM

## 2018-05-16 DIAGNOSIS — R609 Edema, unspecified: Secondary | ICD-10-CM

## 2018-05-16 DIAGNOSIS — Z8669 Personal history of other diseases of the nervous system and sense organs: Secondary | ICD-10-CM

## 2018-05-16 DIAGNOSIS — Z7902 Long term (current) use of antithrombotics/antiplatelets: Secondary | ICD-10-CM

## 2018-05-16 DIAGNOSIS — F329 Major depressive disorder, single episode, unspecified: Secondary | ICD-10-CM

## 2018-05-16 DIAGNOSIS — I110 Hypertensive heart disease with heart failure: Secondary | ICD-10-CM

## 2018-05-16 DIAGNOSIS — G40909 Epilepsy, unspecified, not intractable, without status epilepticus: Secondary | ICD-10-CM

## 2018-05-16 DIAGNOSIS — G929 Unspecified toxic encephalopathy: Secondary | ICD-10-CM

## 2018-05-16 DIAGNOSIS — F1010 Alcohol abuse, uncomplicated: Secondary | ICD-10-CM

## 2018-05-16 DIAGNOSIS — F339 Major depressive disorder, recurrent, unspecified: Secondary | ICD-10-CM

## 2018-05-16 DIAGNOSIS — I2510 Atherosclerotic heart disease of native coronary artery without angina pectoris: Secondary | ICD-10-CM

## 2018-05-16 DIAGNOSIS — I252 Old myocardial infarction: Secondary | ICD-10-CM

## 2018-05-16 DIAGNOSIS — M7989 Other specified soft tissue disorders: Secondary | ICD-10-CM

## 2018-05-16 DIAGNOSIS — Z79899 Other long term (current) drug therapy: Secondary | ICD-10-CM

## 2018-05-16 DIAGNOSIS — R410 Disorientation, unspecified: Secondary | ICD-10-CM

## 2018-05-16 DIAGNOSIS — F418 Other specified anxiety disorders: Secondary | ICD-10-CM

## 2018-05-16 DIAGNOSIS — R451 Restlessness and agitation: Secondary | ICD-10-CM

## 2018-05-16 DIAGNOSIS — M549 Dorsalgia, unspecified: Secondary | ICD-10-CM

## 2018-05-16 DIAGNOSIS — Z885 Allergy status to narcotic agent status: Secondary | ICD-10-CM

## 2018-05-16 DIAGNOSIS — Z9689 Presence of other specified functional implants: Secondary | ICD-10-CM

## 2018-05-16 DIAGNOSIS — F32A Depression, unspecified: Secondary | ICD-10-CM

## 2018-05-16 DIAGNOSIS — Z79891 Long term (current) use of opiate analgesic: Secondary | ICD-10-CM

## 2018-05-16 DIAGNOSIS — Z882 Allergy status to sulfonamides status: Secondary | ICD-10-CM

## 2018-05-16 DIAGNOSIS — G629 Polyneuropathy, unspecified: Secondary | ICD-10-CM

## 2018-05-16 DIAGNOSIS — Z888 Allergy status to other drugs, medicaments and biological substances status: Secondary | ICD-10-CM

## 2018-05-16 DIAGNOSIS — G8929 Other chronic pain: Secondary | ICD-10-CM

## 2018-05-16 LAB — COMPREHENSIVE METABOLIC PANEL
ALBUMIN: 3 g/dL — AB (ref 3.5–5.0)
ALT: 18 U/L (ref 0–44)
AST: 32 U/L (ref 15–41)
Alkaline Phosphatase: 93 U/L (ref 38–126)
Anion gap: 8 (ref 5–15)
BUN: 10 mg/dL (ref 8–23)
CHLORIDE: 99 mmol/L (ref 98–111)
CO2: 29 mmol/L (ref 22–32)
Calcium: 8.4 mg/dL — ABNORMAL LOW (ref 8.9–10.3)
Creatinine, Ser: 0.98 mg/dL (ref 0.61–1.24)
GFR calc Af Amer: >60 mL/min (ref >60)
GFR calc non Af Amer: >60 mL/min (ref >60)
GLUCOSE: 87 mg/dL (ref 70–99)
POTASSIUM: 4 mmol/L (ref 3.5–5.1)
Sodium: 136 mmol/L (ref 135–145)
Total Bilirubin: 1 mg/dL (ref 0.3–1.2)
Total Protein: 5.6 g/dL — ABNORMAL LOW (ref 6.5–8.1)

## 2018-05-16 LAB — CBC
HEMATOCRIT: 30.5 % — AB (ref 39.0–52.0)
Hemoglobin: 9.9 g/dL — ABNORMAL LOW (ref 13.0–17.0)
MCH: 28.8 pg (ref 26.0–34.0)
MCHC: 32.5 g/dL (ref 30.0–36.0)
MCV: 88.7 fL (ref 78.0–100.0)
Platelets: 187 10*3/uL (ref 150–400)
RBC: 3.44 MIL/uL — ABNORMAL LOW (ref 4.22–5.81)
RDW: 14 % (ref 11.5–15.5)
WBC: 4.3 10*3/uL (ref 4.0–10.5)

## 2018-05-16 LAB — HIV ANTIBODY (ROUTINE TESTING W REFLEX): HIV SCREEN 4TH GENERATION: NONREACTIVE

## 2018-05-16 LAB — RPR: RPR: NONREACTIVE

## 2018-05-16 MED ORDER — CYANOCOBALAMIN 1000 MCG/ML IJ SOLN
1000.0000 ug | Freq: Once | INTRAMUSCULAR | Status: AC
  Administered 2018-05-16: 1000 ug via INTRAMUSCULAR
  Filled 2018-05-16: qty 1

## 2018-05-16 MED ORDER — PANTOPRAZOLE SODIUM 40 MG PO TBEC
40.0000 mg | DELAYED_RELEASE_TABLET | Freq: Every day | ORAL | Status: DC
  Administered 2018-05-16 – 2018-05-17 (×2): 40 mg via ORAL
  Filled 2018-05-16 (×2): qty 1

## 2018-05-16 MED ORDER — GABAPENTIN 600 MG PO TABS
600.0000 mg | ORAL_TABLET | Freq: Three times a day (TID) | ORAL | Status: DC
  Administered 2018-05-16 – 2018-05-17 (×3): 600 mg via ORAL
  Filled 2018-05-16 (×3): qty 1

## 2018-05-16 NOTE — Progress Notes (Addendum)
NEURO HOSPITALIST PROGRESS NOTE   Subjective: Patient in bed, eyes close, NAD. Easily awaked to name calling. No complaints. No seizure activity noted overnight, no outburst. Cooperative with exam.   Exam: Vitals:   05/16/18 0410 05/16/18 0829  BP: 131/70 (!) 141/67  Pulse: 67 64  Resp: 20 18  Temp: (!) 97.5 F (36.4 C) 97.7 F (36.5 C)  SpO2: 95% 99%    Physical Exam   HEENT-  Normocephalic, no lesions, without obvious abnormality.  Normal external eye and conjunctiva.   Cardiovascular- S1-S2 audible, pulses palpable throughout   Lungs-no rhonchi or wheezing noted, no excessive working breathing.  Saturations within normal limits on RA Extremities- Warm, dry and intact Musculoskeletal-no joint tenderness, deformity or swelling Skin-warm and dry, abrasion to left forehead.   Neuro:  Mental Status: Alert, oriented, name/age/year/ did not know month/place/situation  Speech fluent without evidence of aphasia. No dysarthria noted. Able to follow commands without difficulty. Cranial Nerves: II: Visual fields grossly normal,  III,IV, VI: ptosis not present, extra-ocular motions intact bilaterally pupils equal, round, reactive to light  V,VII: smile symmetric, facial light touch sensation normal bilaterally VIII: hearing normal bilaterally IX,X: uvula rises symmetrically XI: bilateral shoulder shrug XII: midline tongue extension Motor: Right : Upper extremity   5/5    Left:     Upper extremity   4/5  Lower extremity   5/5     Lower extremity   5/5 Tone and bulk:normal tone throughout; no atrophy noted Sensory:  light touch intact throughout, bilaterally Deep Tendon Reflexes: 2+ and symmetric biceps, patella Plantars: Right: downgoing   Left: downgoing Cerebellar: normal finger-to-nose,  Gait: deferred    Medications:  Scheduled: . alfuzosin  10 mg Oral QPC supper  . amLODipine  10 mg Oral Daily  . aspirin EC  81 mg Oral Daily  . atorvastatin  40  mg Oral q1800  . citalopram  20 mg Oral Daily  . clopidogrel  75 mg Oral Daily  . divalproex  500 mg Oral Q12H  . docusate sodium  200 mg Oral QHS  . enoxaparin (LOVENOX) injection  40 mg Subcutaneous Q24H  . [START ON 05/17/2018] thiamine injection  250 mg Intravenous Q24H  . vitamin B-12  100 mcg Oral Daily   Continuous: . thiamine injection 500 mg (05/16/18 0543)   FOY:DXAJOINOM  Pertinent Labs/Diagnostics: TSH: 2.223 Na: 128 Ca: 8.4 Total protein: 5.6 Albumin: 3.0 VBG: Hgb: 9.9 HCT: 30.5 B12: 270 Ammonia: 44 RPR: pending   Dg Chest 2 View  Result Date: 05/15/2018 CLINICAL DATA:  Pt arrives via [facility] for c/o bilateral foot pain and altered mental status, takes pain meds regularly. Ems reports that patients pupils were pinpoint, O2 sats were low when fire got to the scene. * EXAM: CHEST - 2 VIEW COMPARISON:  05/12/2018 FINDINGS: The heart is normal in size. Shallow lung inflation. No focal consolidation or pleural effusions. Mildly prominent interstitial markings for likely chronic. Remote RIGHT clavicle fracture. LEFT shoulder replacement. Patient has a spinal stimulator overlying the midthoracic spine. IMPRESSION: Shallow inflation.  No evidence for acute  abnormality. Electronically Signed   By: Nolon Nations M.D.   On: 05/15/2018 14:25   Ct Head Wo Contrast  Result Date: 05/15/2018 CLINICAL DATA:  c/o bilateral foot pain and altered mental status, takes pain meds regularly. Pt also now reports dizziness that has been off and  on for the past 10 years and is flaring up today EXAM: CT HEAD WITHOUT CONTRAST TECHNIQUE: Contiguous axial images were obtained from the base of the skull through the vertex without intravenous contrast. COMPARISON:  CT of the brain 05/12/2018 and earlier FINDINGS: Brain: There periventricular white matter changes consistent with small vessel disease. There is encephalomalacia in the RIGHT posterior parietal lobe consistent with subacute versus  chronic infarction. There is no intra or extra-axial fluid collection or mass lesion. The basilar cisterns and ventricles have a normal appearance. There is no CT evidence for acute infarction or hemorrhage. Vascular: No hyperdense vessel. There is atherosclerotic calcifications of the carotic siphonsand vertebral arteries. Skull: Normal. Negative for fracture or focal lesion. Sinuses/Orbits: No acute finding. Other: None IMPRESSION: 1. Stable appearance of subacute versus chronic infarct of the RIGHT posterior parietal region. 2.  No evidence for acute intracranial abnormality. 3. Chronic microvascular disease. Electronically Signed   By: Nolon Nations M.D.   On: 05/15/2018 14:59    Laurey Morale, MSN, NP-C [facility] [phone]  05/16/2018, 8:15 AM   Attending neurologist's note to follow    NEUROHOSPITALIST ADDENDUM Performed a face to face diagnostic evaluation.  I have reviewed the contents of history and physical exam as documented by PA/ARNP/Resident and agree with above documentation.  I have discussed and formulated the above plan as documented. Edits to the note have been made as needed.      Assessment:  72 year old man with past medical history of recent right PCA stroke with mild residual left hemiparesis, seizures that started after for which he is on Keppra, anxiety and depression for which he was on Celexa that was recently discontinued, history of alcohol abuse, chronic back pain on medications, hypertension and neuropathy presenting for altered mental status and behavioral changes according to his wife. He was started on Depakote yesterday and taken off Keppra and his doing much better according to his wife.  Patient is alert oriented and appears pleasant.  Given rapid improvement I think this is less likely Wernicke's, however would continue today's IV dose and then start on thiamine p.o.  Impression: Aggression/mood swings side effect from  Keppra  Recommendations:   -Continue Depakote 500 twice daily .  Depakote has mood stabilizing effects as well and might be helpful in addition to his antidepressant.  -Continue Celexa 20mg  PO daily.  -High dose Thiamine IV  -500 mg IV TID for 2 days, then switch to p.o. -Supplement other B vitamins orally -Continue home antiplatelets and statin -PT/ OT /ST -Seizure precautions   -Patient is back to baseline tomorrow and cleared by PT/ OT, can be discharged from neurology standpoint.  Needs to follow-up with his neurologist as outpatient.  -Neurology service will sign off.       Karena Addison Tamikia Chowning MD [facility] [phone]   If 7pm to 7am, please call on call as listed on AMION.

## 2018-05-16 NOTE — Progress Notes (Signed)
Internal Medicine Attending  Date: 05/16/2018  Patient name: William Jordan Medical record number: [id number] Date of birth: 07-Jan-1946 Age: 72 y.o. Gender: male  I saw and evaluated the patient. I reviewed the resident's note by Dr. Berline Lopes and I agree with the resident's findings and plans as documented in his progress note.  Please see my H&P dated 05/16/2018 and attached Dr. Jerlyn Ly H&P dated 05/15/2018 for the specifics of my evaluation, assessment, and plan from earlier in the day. We appreciate neurology's input and their assessment. Given his rapid improvement Wernike's encephalopathy is felt to be less likely and he will not require the 7 day course of IV thiamine.

## 2018-05-16 NOTE — Progress Notes (Signed)
   Subjective: The patient was much improved today. He denied acute concerns stating simply that he did not recall why he was brought here.   Objective:  Vital signs in last 24 hours: Vitals:   05/15/18 2233 05/15/18 2239 05/16/18 0410 05/16/18 0829  BP: 136/83  131/70 (!) 141/67  Pulse: 74  67 64  Resp: 18 18 20 18   Temp: 98.6 F (37 C)  (!) 97.5 F (36.4 C) 97.7 F (36.5 C)  TempSrc: Oral  Oral Oral  SpO2: 99%  95% 99%  Weight:  84.6 kg    Height:  5\' 10"  (1.778 m)     Physical Exam: General: A/O x4, in no acute distress, afebrile, nondiaphoretic, comfortable at rest Cardio: RRR, no mrg's Pulm: Lungs are clear to auscultation bilaterally  GI: Abdomen is soft, nontender  Assessment/Plan:  Principal Problem:   Encephalopathy Active Problems:   Chronic pain   Essential hypertension   History of CVA (cerebrovascular accident)   Left leg swelling   Alcohol abuse   Depression  Assessment:  William Jordan is a 72 year old male w/ a complex PMHx most notable for multiple admissions or acute encephalopathy, PCA CVA complicated by seizure disorder, NSTEMI w/ nonobstructive CAD on cath and EtOH/TCH use disorder as well as chronic back pain on long term opioid analgesics who presented with confusion, aggressive symptoms toward family since discharge near the end of last month. This is felt to most likely be secondary to toxic encephalopathy due to his overuse of oxycodone, gabapentin, keppra and THC. The patient improved following cessation of the oxycodone and alternative therapy with Depakote in leu of the [place].   Plan: Toxic encephalopathy: As above, likely medication induced. Agree with neuro recommendations to discontinue the Keppra and initiate therapy with Depakote for his seizure disorder due to side effects of the Keppra. Agree with Celexa 20mg  daily. Patient appears to have improved greatly since presentation following discontinuation of his medications and proper dosing.    -Oxycodone Q8 hours, not Q4 -Gabapentin continued  -Okay with PT/OT/ST -Seizure precautions  -One time dose of B-12 IV w/ PO ordered due to B12 hypovitaminosis  Possible Wernicke's encephalopathy: This seems unlikely given his improvement thus far and exam but given his history of EtOH use, I suppose this is possible. Agree with high dose Thiamine IV x 2 days and transition to PO with probable discharge in the next few days if all parties are amenable. Patient did not seemingly demonstrate the classic symptoms of Wernickes on exam lacking nystagmus. Will reassess in am and if he continues to demonstrate clinical improvement he may not require the full 7 day treatment course inpatient.  Dispo: Anticipated discharge in approximately 2-7 day(s).   Kathi Ludwig, MD 05/16/2018, 12:51 PM Pager: Pager# [phone]

## 2018-05-16 NOTE — Plan of Care (Addendum)
Patient stable, discussed POC with patient, agreeable with plan, denies question/concerns at this time.  

## 2018-05-17 MED ORDER — DIVALPROEX SODIUM 500 MG PO DR TAB: 500.0000 mg | DELAYED_RELEASE_TABLET | Freq: Two times a day (BID) | ORAL | 5 refills | Status: AC

## 2018-05-17 MED ORDER — VITAMIN B-1 250 MG PO TABS: 250.0000 mg | ORAL_TABLET | Freq: Every day | ORAL | 0 refills | Status: AC

## 2018-05-17 MED ORDER — GUAIFENESIN ER 600 MG PO TB12: 600.0000 mg | ORAL_TABLET | Freq: Two times a day (BID) | ORAL | 0 refills | Status: AC | PRN

## 2018-05-17 MED ORDER — GUAIFENESIN ER 600 MG PO TB12
600.0000 mg | ORAL_TABLET | Freq: Two times a day (BID) | ORAL | Status: DC
  Administered 2018-05-17: 600 mg via ORAL
  Filled 2018-05-17: qty 1

## 2018-05-17 MED ORDER — CITALOPRAM HYDROBROMIDE 20 MG PO TABS: 20.0000 mg | ORAL_TABLET | Freq: Every day | ORAL | 5 refills | Status: DC

## 2018-05-17 MED ORDER — CETIRIZINE HCL 10 MG PO TABS: 10.0000 mg | ORAL_TABLET | Freq: Every day | ORAL | Status: AC

## 2018-05-17 NOTE — Discharge Summary (Signed)
Name: William Jordan MRN: [id number] DOB: 1946-06-03 72 y.o. PCP: Townsend Roger, MD  Date of Admission: 05/15/2018 10:46 AM Date of Discharge: 05/17/2018 Attending Physician: Oval Linsey, MD  Discharge Diagnosis: 1. Encephalopathy Secondary to Medication Adverse Effect  Discharge Medications: Allergies as of 05/17/2018      Reactions   Ace Inhibitors Shortness Of Breath, Swelling, Other (See Comments)   Angioedema (throat and tongue became swollen)   Morphine Other (See Comments)   Causes confusion and "makes me crazy," per the patient   Sulfa Antibiotics Other (See Comments)   Unknown reaction, from childhood      Medication List    STOP taking these medications   celecoxib 100 MG capsule Commonly known as:  CELEBREX   omeprazole 20 MG capsule Commonly known as:  PRILOSEC     TAKE these medications   acetaminophen 325 MG tablet Commonly known as:  TYLENOL Take 2 tablets (650 mg total) by mouth every 4 (four) hours as needed for headache or mild pain.   alfuzosin 10 MG 24 hr tablet Commonly known as:  UROXATRAL Take 1 tablet (10 mg total) by mouth daily after supper.   amLODipine 10 MG tablet Commonly known as:  NORVASC Take 10 mg by mouth daily.   aspirin 325 MG tablet Take 1 tablet (325 mg total) by mouth daily. What changed:  Another medication with the same name was removed. Continue taking this medication, and follow the directions you see here.   atorvastatin 40 MG tablet Commonly known as:  LIPITOR Take 1 tablet (40 mg total) by mouth daily at 6 PM.   cetirizine 10 MG tablet Commonly known as:  ZYRTEC Take 1 tablet (10 mg total) by mouth daily. What changed:  when to take this   cholecalciferol 1000 units tablet Commonly known as:  VITAMIN D Take 2,000 Units by mouth daily.   citalopram 20 MG tablet Commonly known as:  CELEXA Take 1 tablet (20 mg total) by mouth daily.   clopidogrel 75 MG tablet Commonly known as:  PLAVIX Take 1 tablet (75 mg  total) by mouth daily.   colchicine 0.6 MG tablet Take 0.6 mg by mouth 3 (three) times daily as needed (for gout flares).   divalproex 500 MG DR tablet Commonly known as:  DEPAKOTE Take 1 tablet (500 mg total) by mouth every 12 (twelve) hours.   docusate sodium 100 MG capsule Commonly known as:  COLACE Take 2 capsules (200 mg total) by mouth at bedtime. What changed:  how much to take   gabapentin 600 MG tablet Commonly known as:  NEURONTIN Take 0.5 tablets (300 mg total) by mouth 3 (three) times daily. What changed:  how much to take   guaiFENesin 600 MG 12 hr tablet Commonly known as:  MUCINEX Take 1 tablet (600 mg total) by mouth 2 (two) times daily as needed for up to 5 days for cough or to loosen phlegm.   magnesium gluconate 500 MG tablet Commonly known as:  MAGONATE Take 500 mg by mouth 2 (two) times daily.   Oxycodone HCl 20 MG Tabs Take 1 tablet (20 mg total) by mouth every 8 (eight) hours as needed. What changed:    when to take this  additional instructions   pantoprazole 40 MG tablet Commonly known as:  PROTONIX Take 1 tablet (40 mg total) by mouth daily.   polyethylene glycol packet Commonly known as:  MIRALAX / GLYCOLAX Take 17 g by mouth daily. What changed:  when to take this  reasons to take this   ramelteon 8 MG tablet Commonly known as:  ROZEREM Take 1 tablet (8 mg total) by mouth at bedtime.   vitamin B-1 250 MG tablet Take 1 tablet (250 mg total) by mouth daily for 5 days.   vitamin B-12 100 MCG tablet Commonly known as:  CYANOCOBALAMIN Take 1 tablet (100 mcg total) by mouth daily.       Disposition and follow-up:   WilliamWilliam Jordan was discharged from [facility] in Stable condition.  At the hospital follow up visit please address:  1.  Medication Induced Encephalopathy: Although he has ongoing episodes of AMS & changes in cognition for the past year, presentation of symptoms during this admission were determined  to be related to new prescription of Keppra and incorrect use of some medications. Keppra was stopped and symptoms resolved. He was then switched to depakote 500mg  bid. We continued his gabapentin, restarted celexa 21mg  qd. He was also taking his oxycodone q4h, and we discussed with William Jordan that this medication should be taken q8h prn. Due to some concern for Wernicke's encephalopathy, he was sent home with po thiamine, but we feel this very unlikely to be his diagnosis. PHQ-9 Score was 7. MOCA score was 20/30.  He would benefit from further workup of episodes of recurrent episodes of AMS per him and his wife with unknown etiology. Asa 81mg  discontinued as patient already taking asa 325mg  qd.   2.  Labs / imaging needed at time of follow-up: none  3.  Pending labs/ test needing follow-up: none  Follow-up Appointments:   Recommended to follow-up with his primary care physician for hospital follow-up.   Hospital Course by problem list: William Jordan is a 72 year old male w/ a complex PMHx of multiple admissions of acute encephalopathy over the past year in addition to history of PCA CVA complicated by seizure disorder, NSTEMI w/ nonobstructive CAD on cath and EtOH/TCH use disorder as well as chronic back pain on long term opioid analgesics who presented to [facility] with three weeks of personality changes, aggression, and AMS per his wife. Workup was at first concerning for Wernicke's encephalopathy as patient had possible history of alcohol abuse, and IV thiamine was started. Ultimately it was found Keppra to be the most likely cause of his encephalopathy as most recent symptoms started with him taking this medication and resolution of symptoms once his medication was discontinued. He was started on Depakote 500mg  bid, which he tolerated well. MOCA test was done with a score of 20/30 and PHQ-9 score was 7. We continued his gabapentin, and restarted celexa 21mg  qd. He was also taking his oxycodone q4h, and we  discussed with William Jordan that this medication should be taken q8h prn.  Both wife and husband were concerned that he has had recurrent episodes of AMS with etiology unknown. Workup for other sources were negative, including a normal B12 level and negative RPR. Of note, he has had neuropathy for years without source. He was also discharged with po thiamine. Considering his MOCA score and through subjective interaction, he did have more difficulty with tasks that required short term memory. It was recommended he follow-up with his PCP for further workup.    Discharge Vitals:   BP (!) 161/73 (BP Location: Left Arm)   Pulse 76   Temp 98.9 F (37.2 C) (Oral)   Resp 16   Ht 5\' 10"  (1.778 m)   Wt 84.6  kg   SpO2 95%   BMI 26.76 kg/m   Pertinent Labs, Studies, and Procedures:   B12:  270 RPR: negative TSH: 2.223  CT 05/15/2018 1. Stable appearance of subacute versus chronic infarct of the RIGHT posterior parietal region. 2.  No evidence for acute intracranial abnormality. 3. Chronic microvascular disease  Discharge Instructions: Discharge Instructions    Ambulatory referral to Physical Therapy   Complete by:  As directed    Diet - low sodium heart healthy   Complete by:  As directed    Discharge instructions   Complete by:  As directed    FOLLOW-UP INSTRUCTIONS Please call and follow-up with your Primary Care Physician in the next two weeks to discuss new medications and for hospital follow-up.   You were hospitalized for Altered Mental Status. Thank you for allowing [place] to be part of your care.    Please note these changes made to your medications:   Please STOP taking  Keppra Celecoxib (CELEBREX) 100mg  capsule. This medication can be harmful in gastroesophageal reflux and may exacerbate symptoms that are relieved by pantoprazole.   Please CHANGE your:  Change Oxycodone HCl 20mg  tablets to 1 tablet every 8 hours as needed for chronic pain  Change Aspirin 81 mg to aspirin 325  mg 1 tablet daily  Change cetirizine (ZYRTEC) 10mg  tablet to 1 tablet per day   Please START taking:   Depakote 500mg  - one tablet every 12 hours Citalopram (CELEXA) 20mg  - take one tablet every day  Thiamine (VITAMIN B-1 250 mg - take one tablet every day for the next five days, starting today  Mucinex 600 mg - take one tablet every 12 hours as needed for cough and to loosen phlegm   Increase activity slowly   Complete by:  As directed       Signed: Molli Hazard A, DO 05/18/2018, 8:19 PM   Pager: [phone]

## 2018-05-17 NOTE — Progress Notes (Signed)
   Subjective: Patient sitting in bed and accompanied by his wife. He states he is feeling well this morning and is not having any more confusion. He is concerned as to why he is having these repeating episodes of AMS. His wife states they have been happening the past year or so. We discussed how frustrating not having a direct source of these repeated symptoms is and that this most recent episode is likely related to the [place] as the onset and cessation of symptoms were directly related to its administration. He denies CP or SOB but states he has had a productive cough the past days.   Objective:  Vital signs in last 24 hours: Vitals:   05/16/18 1801 05/16/18 2041 05/17/18 0012 05/17/18 0400  BP: (!) 146/69 (!) 149/87 130/82 (!) 144/76  Pulse: 76 71 79 72  Resp: 18 20 (!) 22 (!) 22  Temp: 97.6 F (36.4 C) 97.9 F (36.6 C)  97.9 F (36.6 C)  TempSrc: Oral Oral  Oral  SpO2: 97% 97% 98% 98%  Weight:      Height:       Constitution: sitting up in bed, NAD, pleasant Cardio: RRR, no m/r/g Respiratory: CTAB, no wheezing, rales or, rhonchi, cough present MSK: strength 5/5 bilaterally, sensations intact UE & LE Neuro: CN II-XII intact, A&Ox3, cooperative  Skin: c/d/i  PHQ-9: 7  - Symptoms do not prevent him from taking care of things at home or getting alone with other people       MOCA: 20/30     Assessment/Plan:  Principal Problem:   Encephalopathy Active Problems:   Chronic pain   Essential hypertension   History of CVA (cerebrovascular accident)   Left leg swelling   Alcohol abuse   Depression   Toxic encephalopathy   Marijuana abuse  72 year old male w/ a complex PMHx most notable for multiple admissions of acute encephalopathy, PCA CVA complicated by seizure disorder, NSTEMI w/ nonobstructive CAD on cath and EtOH/TCH use disorder as well as chronic neuropathy on long term opioid analgesics who presented with confusion, aggressive symptoms toward family since  discharge near the end of last month. This is felt to most likely be secondary to toxic encephalopathy due to his overuse of oxycodone, gabapentin, keppra and THC. The patient improved following cessation of the oxycodone and alternative therapy with Depakote in leu of the [place].   Plan: Toxic encephalopathy: Patient tolerating depakote 500 mg bid and continued cessation of symptoms. MOCA test 20/30 today. PHQ-9 7, significant for mild depression.   -Oxycodone Q8 hours for chronic pain - cont. Gabapentin - cont. celexa - cont. depakote  - discuss PT/OT with care management - cont. Thiamine po   Possible Wernicke's encephalopathy: Unlikely considering PE not consistent with Wernicke's and resolution of symptoms with cessation of Keppra. He received IV thiamine, and we will continue po thiamine as he has an unclear history of alcohol abuse.   - cont. Thiamine PO  Dispo: Anticipated discharge in approximately today.    Molli Hazard A, DO 05/17/2018, 6:57 AM Pager: [phone]

## 2018-05-17 NOTE — Care Management Note (Signed)
Case Management Note  Patient Details  Name: SOULEYMANE SAIKI MRN: [id number] Date of Birth: August 12, 1946  Subjective/Objective:                    Action/Plan: Pt discharging home with self care. Pt already enrolled in outpatient PT. CM provided him resumption orders, Per Dr Berline Lopes, so he can begin outpatient therapy after d/c. Pt has transportation home.    Expected Discharge Date:  05/17/18               Expected Discharge Plan:  OP Rehab  In-House Referral:     Discharge planning Services  CM Consult  Post Acute Care Choice:    Choice offered to:     DME Arranged:    DME Agency:     HH Arranged:    HH Agency:     Status of Service:  Completed, signed off  If discussed at [employer] of Stay Meetings, dates discussed:    Additional Comments:  Pollie Friar, RN 05/17/2018, 1:21 PM

## 2018-05-17 NOTE — Progress Notes (Signed)
Pt d/c home. Pt says MD asked for RN to give night dose of Depakote for seizures pt will take the dose at 2200 at home

## 2018-05-17 NOTE — Progress Notes (Signed)
Pt d/c home. Pt is stable, on room air, no new concerns. D/c instructions given to pt. Pt verbalize understanding.

## 2018-05-18 ENCOUNTER — Other Ambulatory Visit: Payer: Self-pay | Admitting: *Deleted

## 2018-05-18 ENCOUNTER — Telehealth (HOSPITAL_COMMUNITY): Payer: Self-pay

## 2018-05-18 ENCOUNTER — Telehealth: Payer: Self-pay | Admitting: *Deleted

## 2018-05-18 NOTE — Patient Outreach (Signed)
[facility]) Care Management  05/18/2018  William Jordan 1945/11/01 [id number]   Care coordination  [facility] RN CM received Epic ADT updated of patient recent admission and discharged This Cm has made multiple attempts to reach [name] and to sent an unsuccessful letter without an response    Plan [facility] RN spoke with [name] Primary care receptionist, who states she will leave a message for Dr Jenelle Mages (participating [facility] MD) about pt inpatient re admission and CM attempts to reach [name] unsuccessfully to engage with [facility].  [facility] RN CM sent a physician involvement barriers letter to Dr Jacqualin Combes Lorenza Chick L. Lavina Hamman, RN, BSN, [place] Coordinator Office number [phone] Mobile number [phone]  Main [facility] number [phone] Fax number [phone]

## 2018-05-18 NOTE — Patient Outreach (Signed)
Opened in error   Whitney L. Lavina Hamman, RN, BSN, [place] Coordinator Office number [phone] Mobile number [phone]  Main [facility] number [phone] Fax number [phone]

## 2018-05-18 NOTE — Telephone Encounter (Signed)
Encounter complete. 

## 2018-05-19 DIAGNOSIS — I10 Essential (primary) hypertension: Secondary | ICD-10-CM | POA: Diagnosis not present

## 2018-05-19 DIAGNOSIS — I2510 Atherosclerotic heart disease of native coronary artery without angina pectoris: Secondary | ICD-10-CM | POA: Diagnosis not present

## 2018-05-19 DIAGNOSIS — I679 Cerebrovascular disease, unspecified: Secondary | ICD-10-CM | POA: Diagnosis not present

## 2018-05-20 ENCOUNTER — Ambulatory Visit (HOSPITAL_COMMUNITY): Admit: 2018-05-20 | Payer: PPO | Attending: Medical | Admitting: Medical

## 2018-05-20 LAB — CULTURE, BLOOD (ROUTINE X 2)
CULTURE: NO GROWTH
Culture: NO GROWTH

## 2018-05-25 ENCOUNTER — Other Ambulatory Visit: Payer: Self-pay | Admitting: *Deleted

## 2018-05-25 NOTE — Patient Outreach (Signed)
[facility]) Care Management  05/25/2018  KWALI WRINKLE [birth year] [id number]   Case closure   Call attempts made on 04/19/18, 04/20/18 04/22/18 05/10/18, 05/11/18 and 05/14/18 Unsuccessful outreach letter sent on 04/19/18 and 05/10/18 without a response   Plan [facility] RN CM will close case after no response from patient within 10+ business days. Unable to reach Case closure letters sent to patient and MD  Joelene Millin L. Lavina Hamman, RN, BSN, [place] Coordinator Office number [phone] Mobile number [phone]  Main [facility] number [phone] Fax number [phone]

## 2018-05-26 NOTE — Progress Notes (Deleted)
Cardiology Office Note:    Date:  05/26/2018   ID:  William Jordan, DOB Sep 03, 1946, MRN [id number]  PCP:  William Roger, MD  Cardiologist:  No primary care provider on file.   Referring MD: William Roger, MD   No chief complaint on file. ***  History of Present Illness:    William Jordan is a 72 y.o. male with a hx of right MCA embolic stroke in 12/2704 thought to be related to ulcerated soft plaque in the right common carotid.  He was discharged to inpatient rehab and was doing well, but developed complaints of chest pain and elevated troponin consistent with a NSTEMI. He had transient episodes of NSVT in rehab. Cardiology was consulted and recommended heart cath. LHC revealed nonobstructive disease and normal LVEF.  He was ultimately discharged back to inpatient rehab on medical therapy.  He was ultimately discharged home.  He was brought back to the ER on 05/12/2018 for not being able to stay awake.  Patient was taking Percocet every 4 hours 20 mg.  He had recently started taking clonidine.  Neuro exam was nonfocal and he was ultimately discharged home with discontinuation of clonidine.  Patient was brought back to the ER on 05/15/2018 with foot pain, altered mental status, and dizziness.  Lower extremity Doppler was negative for DVT.  He was admitted for medication adjustment for medication induced encephalopathy.  It was thought that this was related to his new prescription for Keppra.  He returns today for hospital follow-up.  EKG         Past Medical History:  Diagnosis Date  . Angioedema    from Benazepril reaction  . Anxiety    takes Celexa  . Arthritis   . Chronic back pain   . CVA (cerebral vascular accident) ([place])   . Enlarged prostate   . History of blood transfusion   . Hypertension   . Neuropathy     Past Surgical History:  Procedure Laterality Date  . CARDIAC CATHETERIZATION  05/03/2018  . COLONOSCOPY     one polyp removed - benign  . ELBOW SURGERY Right   .  HERNIA REPAIR     umbilical  . JOINT REPLACEMENT Left    knee, x 2  . JOINT REPLACEMENT Right    hip  . JOINT REPLACEMENT Left    partial shoulder  . LEFT HEART CATH AND CORONARY ANGIOGRAPHY N/A 05/03/2018   Procedure: LEFT HEART CATH AND CORONARY ANGIOGRAPHY;  Surgeon: Martinique, Peter M, MD;  Location: [place] [facility];  Service: Cardiovascular;  Laterality: N/A;  . LOOP RECORDER INSERTION N/A 04/27/2018   Procedure: LOOP RECORDER INSERTION;  Surgeon: Evans Lance, MD;  Location: [place] [facility];  Service: Cardiovascular;  Laterality: N/A;  . SPINAL CORD STIMULATOR INSERTION N/A 09/28/2014   Procedure: LUMBAR SPINAL CORD STIMULATOR INSERTION;  Surgeon: Melina Schools, MD;  Location: [place];  Service: Orthopedics;  Laterality: N/A;  . TEE WITHOUT CARDIOVERSION N/A 04/27/2018   Procedure: TRANSESOPHAGEAL ECHOCARDIOGRAM (TEE);  Surgeon: Larey Dresser, MD;  Location: [facility] ENDOSCOPY;  Service: Cardiovascular;  Laterality: N/A;  . TONSILLECTOMY    . VASECTOMY      Current Medications: No outpatient medications have been marked as taking for the 05/27/18 encounter (Appointment) with Ledora Bottcher, [place].     Allergies:   Ace inhibitors; Morphine; and Sulfa antibiotics   Social History   Socioeconomic History  . Marital status: Married    Spouse name: Not  on file  . Number of children: Not on file  . Years of education: Not on file  . Highest education level: Not on file  Occupational History  . Not on file  Social Needs  . Financial resource strain: Not on file  . Food insecurity:    Worry: Not on file    Inability: Not on file  . Transportation needs:    Medical: Not on file    Non-medical: Not on file  Tobacco Use  . Smoking status: Current Every Day Smoker    Types: E-cigarettes, Cigars  . Smokeless tobacco: Former Systems developer    Types: Snuff  . Tobacco comment: does not use any tobacco products any more  Substance and Sexual Activity  . Alcohol use: Yes    Comment: occ    . Drug use: No  . Sexual activity: Never  Lifestyle  . Physical activity:    Days per week: Not on file    Minutes per session: Not on file  . Stress: Not on file  Relationships  . Social connections:    Talks on phone: Not on file    Gets together: Not on file    Attends religious service: Not on file    Active member of club or organization: Not on file    Attends meetings of clubs or organizations: Not on file    Relationship status: Not on file  Other Topics Concern  . Not on file  Social History Narrative  . Not on file     Family History: The patient's ***family history includes Heart disease in his father.  ROS:   Please see the history of present illness.    *** All other systems reviewed and are negative.  EKGs/Labs/Other Studies Reviewed:    The following studies were reviewed today:    Heart cath 05/03/18: Cath: 05/03/18   Prox RCA to Mid RCA lesion is 50% stenosed.  Prox Cx lesion is 50% stenosed.  Mid Cx to Dist Cx lesion is 30% stenosed.  Prox LAD lesion is 35% stenosed.  The left ventricular systolic function is normal.  LV end diastolic pressure is normal.  The left ventricular ejection fraction is 55-65% by visual estimate.  1. Nonobstructive CAD 2. Normal LV function 3. Normal LVEDP  Plan: medical management.  Recommend Aspirin 81mg  daily for moderate CAD.  EKG:  EKG is *** ordered today.  The ekg ordered today demonstrates ***  Recent Labs: 12/10/2017: B Natriuretic Peptide 138.2 04/30/2018: Magnesium 1.8 05/15/2018: TSH 2.223 05/16/2018: ALT 18; BUN 10; Creatinine, Ser 0.98; Hemoglobin 9.9; Platelets 187; Potassium 4.0; Sodium 136  Recent Lipid Panel    Component Value Date/Time   CHOL 157 04/26/2018 0355   TRIG 123 04/26/2018 0355   HDL 60 04/26/2018 0355   CHOLHDL 2.6 04/26/2018 0355   VLDL 25 04/26/2018 0355   LDLCALC 72 04/26/2018 0355    Physical Exam:    VS:  There were no vitals taken for this visit.    Wt  Readings from Last 3 Encounters:  05/15/18 186 lb 8.1 oz (84.6 kg)  05/12/18 176 lb (79.8 kg)  05/05/18 179 lb 7.3 oz (81.4 kg)     GEN: *** Well nourished, well developed in no acute distress HEENT: Normal NECK: No JVD; No carotid bruits LYMPHATICS: No lymphadenopathy CARDIAC: ***RRR, no murmurs, rubs, gallops RESPIRATORY:  Clear to auscultation without rales, wheezing or rhonchi  ABDOMEN: Soft, non-tender, non-distended MUSCULOSKELETAL:  No edema; No deformity  SKIN: Warm and  dry NEUROLOGIC:  Alert and oriented x 3 PSYCHIATRIC:  Normal affect   ASSESSMENT:    No diagnosis found. PLAN:    In order of problems listed above:  No diagnosis found.   Medication Adjustments/Labs and Tests Ordered: Current medicines are reviewed at length with the patient today.  Concerns regarding medicines are outlined above.  No orders of the defined types were placed in this encounter.  No orders of the defined types were placed in this encounter.   Signed, Ledora Bottcher, PA  05/26/2018 8:07 PM    [facility]

## 2018-05-27 ENCOUNTER — Ambulatory Visit: Payer: PPO | Admitting: Physician Assistant

## 2018-05-28 ENCOUNTER — Encounter: Payer: PPO | Attending: Physical Medicine & Rehabilitation

## 2018-05-28 ENCOUNTER — Encounter: Payer: Self-pay | Admitting: Physical Medicine & Rehabilitation

## 2018-05-28 ENCOUNTER — Other Ambulatory Visit: Payer: Self-pay

## 2018-05-28 ENCOUNTER — Ambulatory Visit: Payer: PPO | Admitting: Physical Medicine & Rehabilitation

## 2018-05-28 VITALS — BP 108/67 | HR 68 | Ht 70.0 in | Wt 185.0 lb

## 2018-05-28 DIAGNOSIS — Z79899 Other long term (current) drug therapy: Secondary | ICD-10-CM | POA: Insufficient documentation

## 2018-05-28 DIAGNOSIS — Z96612 Presence of left artificial shoulder joint: Secondary | ICD-10-CM | POA: Insufficient documentation

## 2018-05-28 DIAGNOSIS — Z79891 Long term (current) use of opiate analgesic: Secondary | ICD-10-CM | POA: Diagnosis not present

## 2018-05-28 DIAGNOSIS — Z7982 Long term (current) use of aspirin: Secondary | ICD-10-CM | POA: Insufficient documentation

## 2018-05-28 DIAGNOSIS — I5032 Chronic diastolic (congestive) heart failure: Secondary | ICD-10-CM | POA: Diagnosis not present

## 2018-05-28 DIAGNOSIS — Z96652 Presence of left artificial knee joint: Secondary | ICD-10-CM | POA: Diagnosis not present

## 2018-05-28 DIAGNOSIS — R269 Unspecified abnormalities of gait and mobility: Secondary | ICD-10-CM | POA: Insufficient documentation

## 2018-05-28 DIAGNOSIS — I639 Cerebral infarction, unspecified: Secondary | ICD-10-CM | POA: Diagnosis not present

## 2018-05-28 DIAGNOSIS — I69398 Other sequelae of cerebral infarction: Secondary | ICD-10-CM

## 2018-05-28 DIAGNOSIS — I110 Hypertensive heart disease with heart failure: Secondary | ICD-10-CM | POA: Insufficient documentation

## 2018-05-28 DIAGNOSIS — G8114 Spastic hemiplegia affecting left nondominant side: Secondary | ICD-10-CM | POA: Insufficient documentation

## 2018-05-28 DIAGNOSIS — F17210 Nicotine dependence, cigarettes, uncomplicated: Secondary | ICD-10-CM | POA: Insufficient documentation

## 2018-05-28 DIAGNOSIS — G609 Hereditary and idiopathic neuropathy, unspecified: Secondary | ICD-10-CM | POA: Diagnosis not present

## 2018-05-28 DIAGNOSIS — Z96641 Presence of right artificial hip joint: Secondary | ICD-10-CM | POA: Diagnosis not present

## 2018-05-28 DIAGNOSIS — F419 Anxiety disorder, unspecified: Secondary | ICD-10-CM | POA: Diagnosis not present

## 2018-05-28 NOTE — Progress Notes (Signed)
Subjective:    Patient ID: William Jordan, male    DOB: 25-Sep-1945, 72 y.o.   MRN: [id number] 72 year old male with history of hypertension idiopathic peripheral neuropathy with spinal cord stimulator chronic low back pain as well as diastolic congestive heart failure who was admitted for left-sided weakness on 04/25/2018.  Developed some tonic-clonic jerking left arm in the ED, CT angiogram of the head showed no significant stenosis there was right posterior occipital and parietal multiple small infarcts.  Echocardiogram with ejection fraction of 60% no thrombus or PFO negative bubble, underwent loop recorder. While on rehab he had a non-STEMI. HPI   Patient living at home with his wife he is modified independent with all self-care and mobility. Excess drowsiness is his main complaint.  Mod I amb without assistive device around the house uses walker for long distance  No change in pain meds, follows up with Dr. Nicholaus Bloom next week  Scheduled for outpt PT in [place], has not attended yet we discussed the importance of this.   Pain Inventory Average Pain 5 Pain Right Now 5 My pain is constant, burning and stabbing  In the last 24 hours, has pain interfered with the following? General activity 10 Relation with others 7 Enjoyment of life 6 What TIME of day is your pain at its worst? daytime Sleep (in general) Poor  Pain is worse with: walking and standing Pain improves with: rest Relief from Meds: 2  Mobility walk without assistance ability to climb steps?  yes do you drive?  no  Function retired I need assistance with the following:  meal prep, household duties and shopping  Neuro/Psych trouble walking depression  Prior Studies Any changes since last visit?  no  Physicians involved in your care Dr Nicholaus Bloom pain management   Family History  Problem Relation Age of Onset  . Heart disease Father    Social History   Socioeconomic History  . Marital  status: Married    Spouse name: Not on file  . Number of children: Not on file  . Years of education: Not on file  . Highest education level: Not on file  Occupational History  . Not on file  Social Needs  . Financial resource strain: Not on file  . Food insecurity:    Worry: Not on file    Inability: Not on file  . Transportation needs:    Medical: Not on file    Non-medical: Not on file  Tobacco Use  . Smoking status: Current Every Day Smoker    Types: E-cigarettes, Cigars  . Smokeless tobacco: Former Systems developer    Types: Snuff  . Tobacco comment: does not use any tobacco products any more  Substance and Sexual Activity  . Alcohol use: Yes    Comment: occ  . Drug use: No  . Sexual activity: Never  Lifestyle  . Physical activity:    Days per week: Not on file    Minutes per session: Not on file  . Stress: Not on file  Relationships  . Social connections:    Talks on phone: Not on file    Gets together: Not on file    Attends religious service: Not on file    Active member of club or organization: Not on file    Attends meetings of clubs or organizations: Not on file    Relationship status: Not on file  Other Topics Concern  . Not on file  Social History Narrative  . Not on  file   Past Surgical History:  Procedure Laterality Date  . CARDIAC CATHETERIZATION  05/03/2018  . COLONOSCOPY     one polyp removed - benign  . ELBOW SURGERY Right   . HERNIA REPAIR     umbilical  . JOINT REPLACEMENT Left    knee, x 2  . JOINT REPLACEMENT Right    hip  . JOINT REPLACEMENT Left    partial shoulder  . LEFT HEART CATH AND CORONARY ANGIOGRAPHY N/A 05/03/2018   Procedure: LEFT HEART CATH AND CORONARY ANGIOGRAPHY;  Surgeon: Martinique, Peter M, MD;  Location: [place] [facility];  Service: Cardiovascular;  Laterality: N/A;  . LOOP RECORDER INSERTION N/A 04/27/2018   Procedure: LOOP RECORDER INSERTION;  Surgeon: Evans Lance, MD;  Location: [place] [facility];  Service: Cardiovascular;   Laterality: N/A;  . SPINAL CORD STIMULATOR INSERTION N/A 09/28/2014   Procedure: LUMBAR SPINAL CORD STIMULATOR INSERTION;  Surgeon: Melina Schools, MD;  Location: [place];  Service: Orthopedics;  Laterality: N/A;  . TEE WITHOUT CARDIOVERSION N/A 04/27/2018   Procedure: TRANSESOPHAGEAL ECHOCARDIOGRAM (TEE);  Surgeon: Larey Dresser, MD;  Location: [facility] ENDOSCOPY;  Service: Cardiovascular;  Laterality: N/A;  . TONSILLECTOMY    . VASECTOMY     Past Medical History:  Diagnosis Date  . Angioedema    from Benazepril reaction  . Anxiety    takes Celexa  . Arthritis   . Chronic back pain   . CVA (cerebral vascular accident) ([place])   . Enlarged prostate   . History of blood transfusion   . Hypertension   . Neuropathy    BP 108/67   Pulse 68   Ht 5\' 10"  (1.778 m) Comment: reported  Wt 185 lb (83.9 kg) Comment: reported  SpO2 98%   BMI 26.54 kg/m   Opioid Risk Score:   Fall Risk Score:  `1  Depression screen PHQ 2/9  Depression screen [facility] 2/9 05/28/2018 04/22/2018  Decreased Interest 2 0  Down, Depressed, Hopeless 1 0  PHQ - 2 Score 3 0  Altered sleeping 1 -  Tired, decreased energy 1 -  Change in appetite 0 -  Feeling bad or failure about yourself  0 -  Trouble concentrating 0 -  Moving slowly or fidgety/restless 0 -  PHQ-9 Score 5 -  Difficult doing work/chores Somewhat difficult -    Review of Systems  Constitutional: Negative.   HENT: Negative.   Eyes: Negative.   Respiratory: Negative.   Cardiovascular: Negative.   Gastrointestinal: Negative.   Endocrine: Negative.   Genitourinary: Negative.   Musculoskeletal: Positive for gait problem.  Skin: Negative.   Allergic/Immunologic: Negative.   Hematological: Negative.   Psychiatric/Behavioral: Positive for dysphoric mood.  All other systems reviewed and are negative.      Objective:   Physical Exam  Constitutional: He appears well-developed and well-nourished.  HENT:  Head: Normocephalic and atraumatic.  Eyes:  Pupils are equal, round, and reactive to light. EOM are normal.  Visual fields intact to confrontation testing    Nursing note and vitals reviewed.  Serial 7 slow but intact to 72 WORLD forward and backward Remembers 2/3 objects  5/5 strength bilateral deltoid biceps triceps grip, HF, KE, ADF  Visual fields intact to confrontation   Gait without toe drag or knee instability , no assistive device Positive Romberg Tone is normal bilateral upper and lower limbs Positive dysdiadochokinesis rapid alternating movements of left upper extremity      Assessment & Plan:  1.  Right  parietal-occipital infarcts appear to be small embolic but no history of atrial fibrillation, will follow-up with neurology in cardiology.  Loop recorder placed. We discussed his residual problems which include balance disorder as well as incoordination with left upper extremity In addition patient has some attention concentration issues which I think are limiting his safe driving abilities at this time. I asked him to bring this up when he sees neurology next month and see if he can be cleared for driving at that time if he makes some further improvements. Physical medicine rehab follow-up on PRN basis

## 2018-05-28 NOTE — Patient Instructions (Signed)
Ask neurologist about driving

## 2018-05-31 ENCOUNTER — Ambulatory Visit (INDEPENDENT_AMBULATORY_CARE_PROVIDER_SITE_OTHER): Payer: PPO | Admitting: *Deleted

## 2018-05-31 DIAGNOSIS — I63411 Cerebral infarction due to embolism of right middle cerebral artery: Secondary | ICD-10-CM

## 2018-06-01 NOTE — Progress Notes (Signed)
Carelink Summary Report / Loop Recorder 

## 2018-06-03 ENCOUNTER — Encounter: Payer: Self-pay | Admitting: Cardiovascular Disease

## 2018-06-08 ENCOUNTER — Telehealth: Payer: Self-pay

## 2018-06-08 NOTE — Telephone Encounter (Signed)
LMOVM requesting that pt send manual transmission b/c home monitor has not updated in at least 14 days.    

## 2018-06-10 DIAGNOSIS — G603 Idiopathic progressive neuropathy: Secondary | ICD-10-CM | POA: Diagnosis not present

## 2018-06-10 DIAGNOSIS — G894 Chronic pain syndrome: Secondary | ICD-10-CM | POA: Diagnosis not present

## 2018-06-10 DIAGNOSIS — Z79891 Long term (current) use of opiate analgesic: Secondary | ICD-10-CM | POA: Diagnosis not present

## 2018-06-10 DIAGNOSIS — M1991 Primary osteoarthritis, unspecified site: Secondary | ICD-10-CM | POA: Diagnosis not present

## 2018-06-13 LAB — CUP PACEART REMOTE DEVICE CHECK
Date Time Interrogation Session: 20190915133726
MDC IDC PG IMPLANT DT: 20190813

## 2018-06-14 DIAGNOSIS — I10 Essential (primary) hypertension: Secondary | ICD-10-CM | POA: Diagnosis not present

## 2018-06-14 DIAGNOSIS — E7800 Pure hypercholesterolemia, unspecified: Secondary | ICD-10-CM | POA: Diagnosis not present

## 2018-06-14 DIAGNOSIS — M109 Gout, unspecified: Secondary | ICD-10-CM | POA: Diagnosis not present

## 2018-06-14 DIAGNOSIS — Z87891 Personal history of nicotine dependence: Secondary | ICD-10-CM | POA: Diagnosis not present

## 2018-06-14 DIAGNOSIS — Z79899 Other long term (current) drug therapy: Secondary | ICD-10-CM | POA: Diagnosis not present

## 2018-06-14 DIAGNOSIS — R600 Localized edema: Secondary | ICD-10-CM | POA: Diagnosis not present

## 2018-06-14 DIAGNOSIS — I252 Old myocardial infarction: Secondary | ICD-10-CM | POA: Diagnosis not present

## 2018-06-14 DIAGNOSIS — M7989 Other specified soft tissue disorders: Secondary | ICD-10-CM | POA: Diagnosis not present

## 2018-06-14 DIAGNOSIS — Z7902 Long term (current) use of antithrombotics/antiplatelets: Secondary | ICD-10-CM | POA: Diagnosis not present

## 2018-06-14 DIAGNOSIS — E871 Hypo-osmolality and hyponatremia: Secondary | ICD-10-CM | POA: Diagnosis not present

## 2018-06-16 ENCOUNTER — Telehealth (HOSPITAL_COMMUNITY): Payer: Self-pay | Admitting: *Deleted

## 2018-06-16 NOTE — Progress Notes (Deleted)
Cardiology Office Note:    Date:  06/16/2018   ID:  William Jordan, DOB 1946/03/29, MRN [id number]  PCP:  Townsend Roger, MD  Cardiologist:  Sanda Klein, MD   Referring MD: Townsend Roger, MD   No chief complaint on file. ***  History of Present Illness:    William Jordan is a 72 y.o. male with a hx of CVA (04/2018) s/p loopr recorder implantation, HTN, nonobstructive CAD on cath in the setting of VT and elevated troponin of 3.09, alcohol use disorder, and seizure disorder. He was recently hospitalized with drug-induced encephalopathy secondary to keppra, discharged on 05/17/18.  Keppra was switched to Depakote.  On 325 mg ASA, plavix, and 40 mg lipitor in addition to Norvasc.  He returns today for post-cath follow-up.    Needs to send a manual transmission, has not received a transmission since 05/30/18.     Past Medical History:  Diagnosis Date  . Angioedema    from Benazepril reaction  . Anxiety    takes Celexa  . Arthritis   . Chronic back pain   . CVA (cerebral vascular accident) ([place])   . Enlarged prostate   . History of blood transfusion   . Hypertension   . Neuropathy     Past Surgical History:  Procedure Laterality Date  . CARDIAC CATHETERIZATION  05/03/2018  . COLONOSCOPY     one polyp removed - benign  . ELBOW SURGERY Right   . HERNIA REPAIR     umbilical  . JOINT REPLACEMENT Left    knee, x 2  . JOINT REPLACEMENT Right    hip  . JOINT REPLACEMENT Left    partial shoulder  . LEFT HEART CATH AND CORONARY ANGIOGRAPHY N/A 05/03/2018   Procedure: LEFT HEART CATH AND CORONARY ANGIOGRAPHY;  Surgeon: Martinique, Peter M, MD;  Location: [place] [facility];  Service: Cardiovascular;  Laterality: N/A;  . LOOP RECORDER INSERTION N/A 04/27/2018   Procedure: LOOP RECORDER INSERTION;  Surgeon: Evans Lance, MD;  Location: [place] [facility];  Service: Cardiovascular;  Laterality: N/A;  . SPINAL CORD STIMULATOR INSERTION N/A 09/28/2014   Procedure: LUMBAR SPINAL CORD  STIMULATOR INSERTION;  Surgeon: Melina Schools, MD;  Location: [place];  Service: Orthopedics;  Laterality: N/A;  . TEE WITHOUT CARDIOVERSION N/A 04/27/2018   Procedure: TRANSESOPHAGEAL ECHOCARDIOGRAM (TEE);  Surgeon: Larey Dresser, MD;  Location: [facility] ENDOSCOPY;  Service: Cardiovascular;  Laterality: N/A;  . TONSILLECTOMY    . VASECTOMY      Current Medications: No outpatient medications have been marked as taking for the 06/21/18 encounter (Appointment) with Ledora Bottcher, [place].     Allergies:   Ace inhibitors; Morphine; and Sulfa antibiotics   Social History   Socioeconomic History  . Marital status: Married    Spouse name: Not on file  . Number of children: Not on file  . Years of education: Not on file  . Highest education level: Not on file  Occupational History  . Not on file  Social Needs  . Financial resource strain: Not on file  . Food insecurity:    Worry: Not on file    Inability: Not on file  . Transportation needs:    Medical: Not on file    Non-medical: Not on file  Tobacco Use  . Smoking status: Current Every Day Smoker    Types: E-cigarettes, Cigars  . Smokeless tobacco: Former Systems developer    Types: Snuff  . Tobacco comment: does not use  any tobacco products any more  Substance and Sexual Activity  . Alcohol use: Yes    Comment: occ  . Drug use: No  . Sexual activity: Never  Lifestyle  . Physical activity:    Days per week: Not on file    Minutes per session: Not on file  . Stress: Not on file  Relationships  . Social connections:    Talks on phone: Not on file    Gets together: Not on file    Attends religious service: Not on file    Active member of club or organization: Not on file    Attends meetings of clubs or organizations: Not on file    Relationship status: Not on file  Other Topics Concern  . Not on file  Social History Narrative  . Not on file     Family History: The patient's ***family history includes Heart disease in his  father.  ROS:   Please see the history of present illness.    *** All other systems reviewed and are negative.  EKGs/Labs/Other Studies Reviewed:    The following studies were reviewed today:  Cath: 05/03/18   Prox RCA to Mid RCA lesion is 50% stenosed.  Prox Cx lesion is 50% stenosed.  Mid Cx to Dist Cx lesion is 30% stenosed.  Prox LAD lesion is 35% stenosed.  The left ventricular systolic function is normal.  LV end diastolic pressure is normal.  The left ventricular ejection fraction is 55-65% by visual estimate.  1. Nonobstructive CAD 2. Normal LV function 3. Normal LVEDP  Plan: medical management.  Recommend Aspirin 81mg  daily for moderate CAD.  EKG:  EKG is *** ordered today.  The ekg ordered today demonstrates ***  Recent Labs: 12/10/2017: B Natriuretic Peptide 138.2 04/30/2018: Magnesium 1.8 05/15/2018: TSH 2.223 05/16/2018: ALT 18; BUN 10; Creatinine, Ser 0.98; Hemoglobin 9.9; Platelets 187; Potassium 4.0; Sodium 136  Recent Lipid Panel    Component Value Date/Time   CHOL 157 04/26/2018 0355   TRIG 123 04/26/2018 0355   HDL 60 04/26/2018 0355   CHOLHDL 2.6 04/26/2018 0355   VLDL 25 04/26/2018 0355   LDLCALC 72 04/26/2018 0355    Physical Exam:    VS:  There were no vitals taken for this visit.    Wt Readings from Last 3 Encounters:  05/28/18 185 lb (83.9 kg)  05/15/18 186 lb 8.1 oz (84.6 kg)  05/12/18 176 lb (79.8 kg)     GEN: *** Well nourished, well developed in no acute distress HEENT: Normal NECK: No JVD; No carotid bruits LYMPHATICS: No lymphadenopathy CARDIAC: ***RRR, no murmurs, rubs, gallops RESPIRATORY:  Clear to auscultation without rales, wheezing or rhonchi  ABDOMEN: Soft, non-tender, non-distended MUSCULOSKELETAL:  No edema; No deformity  SKIN: Warm and dry NEUROLOGIC:  Alert and oriented x 3 PSYCHIATRIC:  Normal affect   ASSESSMENT:    No diagnosis found. PLAN:    In order of problems listed above:  No diagnosis  found.   Medication Adjustments/Labs and Tests Ordered: Current medicines are reviewed at length with the patient today.  Concerns regarding medicines are outlined above.  No orders of the defined types were placed in this encounter.  No orders of the defined types were placed in this encounter.   Signed, Ledora Bottcher, [place]  06/16/2018 4:33 PM    [facility]

## 2018-06-16 NOTE — Telephone Encounter (Signed)
Close encounter 

## 2018-06-17 ENCOUNTER — Ambulatory Visit (HOSPITAL_COMMUNITY): Admission: RE | Admit: 2018-06-17 | Payer: PPO | Source: Ambulatory Visit | Attending: Medical | Admitting: Medical

## 2018-06-18 ENCOUNTER — Encounter: Payer: Self-pay | Admitting: Cardiology

## 2018-06-21 ENCOUNTER — Ambulatory Visit: Payer: PPO | Admitting: Physician Assistant

## 2018-06-21 ENCOUNTER — Telehealth: Payer: Self-pay

## 2018-06-21 NOTE — Telephone Encounter (Signed)
New message    Just an FYI.  We have made several attempts to contact this patient including sending a letter to schedule or reschedule their MYOCARDIAL PERFUSION. We will be removing the patient from the WQ.   Thank you 

## 2018-06-22 ENCOUNTER — Encounter: Payer: Self-pay | Admitting: *Deleted

## 2018-06-28 ENCOUNTER — Encounter: Payer: Self-pay | Admitting: Adult Health

## 2018-06-28 ENCOUNTER — Ambulatory Visit: Payer: PPO | Admitting: Adult Health

## 2018-06-28 ENCOUNTER — Telehealth: Payer: Self-pay

## 2018-06-28 NOTE — Progress Notes (Deleted)
[facility] [address] [place]. [place] [address] [phone]       OFFICE FOLLOW UP NOTE  [name] Date of Birth:  1945-11-26 Medical Record Number:  [id number]   Reason for Referral:  hospital stroke follow up  CHIEF COMPLAINT:  No chief complaint on file.   HPI: William Jordan is being seen today for initial visit in the office for right MCA frontal and occipital patchy infarcts with embolic pattern which could have been due to also a large soft plaque right CCA versus cardioembolic source on 9/73/5329. History obtained from *** and chart review. Reviewed all radiology images and labs personally.  [name] is a 72 y.o. male with history of anxiety, HTN, idiopathic peripheral neuropathy with nerve stimulator, and chronic pain (under pain management, opoid abuse)  who presented with 2-day history of acute onset Lt sided weakness, sensory decline, transient L eye visual disturbance.  Per notes, while in the ED he developed tonic-clonic jerking of his left arm along with transient episode of chest pain.  CT head reviewed and was negative for acute infarct, hemorrhage or mass-effect but did show multiple old infarcts.  MRI unable to be obtained due to spinal stimulator.  Repeat CT head showed cortical hypoattenuation along the right pre-and post central gyrus consistent with acute/subacute infarct.  Repeat CT head following day confirmed right MCA frontal and occipital patchy infarcts.  CTA head and neck showed right mid CCA large soft plaque with surface ulceration.  2D echo showing EF of 60 to 65%.  TEE showed an EF of 55 to 60% without evidence of PFO.  Lower extremity venous Dopplers were negative for DVT.  EEG obtained due to tonic-clonic jerking which showed abnormal EEG secondary to posterior background slowing.  He was initially loaded with fosphenytoin and placed on Keppra twice daily but as EEG did not show evidence of seizure activity along with no  additional tonic-clonic symptoms, AED was discontinued and recommended continued seizure precautions.  It was felt as though episode of chest pain was related to transient V. tach which was shown on telemetry monitoring ED and cardiology recommended loop recorder placement as this will also help rule out possible atrial fibrillation as possible cause of recent infarcts.  It was felt as though recent infarcts were embolic pattern which could be due to ulcerative large soft plaque in the right CCA versus cardioembolic source.  LDL 72 and recommended starting Lipitor 40 mg daily.  HTN stable during admission and recommended long-term BP goal normotensive range.  Due to right CCA stenosis, it was felt as though this could be due to soft plaque with ulceration versus thrombus in mid CCA and recommended DAPT for 3 months and then Plavix alone along with possible consideration of anticoagulation in the future if patient experiences fluctuating symptoms.  Patient was discharged to [facility] for continued therapy in stable condition.  During CIR admission, patient experienced nonspecific chest pain with an elevation in troponin which is felt to be consistent with non-STEMI and was readmitted to acute care services.  He underwent cardiac cath on 05/03/2018 which showed nonobstructive CAD, with normal LV function and was readmitted back to inpatient rehab to resume therapies.  Patient was discharged home in stable condition on 05/07/2018.  Patient return to ED on 05/15/2018 with concerns of 3-week personality changes, aggression and AMS per wife.  Patient initially worked up for Warnicke's encephalopathy due to history of alcohol abuse and IV thiamine was  started.  At some point, patient was restarted on Keppra and it was ultimately determined that [place] will was the most likely cause of his encephalopathy and after discontinuation, resolution of symptoms were observed.  Started on Depakote 500 mg twice daily.  Moca test done with a  score of 20/30 and PHQ 9 7.  All other work-up negative.    ROS:   14 system review of systems performed and negative with exception of ***  PMH:  Past Medical History:  Diagnosis Date  . Angioedema    from Benazepril reaction  . Anxiety    takes Celexa  . Arthritis   . Chronic back pain   . CVA (cerebral vascular accident) ([place])   . Enlarged prostate   . History of blood transfusion   . Hypertension   . Neuropathy     PSH:  Past Surgical History:  Procedure Laterality Date  . CARDIAC CATHETERIZATION  05/03/2018  . COLONOSCOPY     one polyp removed - benign  . ELBOW SURGERY Right   . HERNIA REPAIR     umbilical  . JOINT REPLACEMENT Left    knee, x 2  . JOINT REPLACEMENT Right    hip  . JOINT REPLACEMENT Left    partial shoulder  . LEFT HEART CATH AND CORONARY ANGIOGRAPHY N/A 05/03/2018   Procedure: LEFT HEART CATH AND CORONARY ANGIOGRAPHY;  Surgeon: Martinique, Peter M, MD;  Location: [facility];  Service: Cardiovascular;  Laterality: N/A;  . LOOP RECORDER INSERTION N/A 04/27/2018   Procedure: LOOP RECORDER INSERTION;  Surgeon: Evans Lance, MD;  Location: [facility];  Service: Cardiovascular;  Laterality: N/A;  . SPINAL CORD STIMULATOR INSERTION N/A 09/28/2014   Procedure: LUMBAR SPINAL CORD STIMULATOR INSERTION;  Surgeon: Melina Schools, MD;  Location: [facility];  Service: Orthopedics;  Laterality: N/A;  . TEE WITHOUT CARDIOVERSION N/A 04/27/2018   Procedure: TRANSESOPHAGEAL ECHOCARDIOGRAM (TEE);  Surgeon: Larey Dresser, MD;  Location: [facility];  Service: Cardiovascular;  Laterality: N/A;  . TONSILLECTOMY    . VASECTOMY      Social History:  Social History   Socioeconomic History  . Marital status: Married    Spouse name: Not on file  . Number of children: Not on file  . Years of education: Not on file  . Highest education level: Not on file  Occupational History  . Not on file  Social Needs  . Financial resource strain: Not on file  .  Food insecurity:    Worry: Not on file    Inability: Not on file  . Transportation needs:    Medical: Not on file    Non-medical: Not on file  Tobacco Use  . Smoking status: Current Every Day Smoker    Types: E-cigarettes, Cigars  . Smokeless tobacco: Former Systems developer    Types: Snuff  . Tobacco comment: does not use any tobacco products any more  Substance and Sexual Activity  . Alcohol use: Yes    Comment: occ  . Drug use: No  . Sexual activity: Never  Lifestyle  . Physical activity:    Days per week: Not on file    Minutes per session: Not on file  . Stress: Not on file  Relationships  . Social connections:    Talks on phone: Not on file    Gets together: Not on file    Attends religious service: Not on file    Active member of club or organization: Not on file  Attends meetings of clubs or organizations: Not on file    Relationship status: Not on file  . Intimate partner violence:    Fear of current or ex partner: Not on file    Emotionally abused: Not on file    Physically abused: Not on file    Forced sexual activity: Not on file  Other Topics Concern  . Not on file  Social History Narrative  . Not on file    Family History:  Family History  Problem Relation Age of Onset  . Heart disease Father     Medications:   Current Outpatient Medications on File Prior to Visit  Medication Sig Dispense Refill  . acetaminophen (TYLENOL) 325 MG tablet Take 2 tablets (650 mg total) by mouth every 4 (four) hours as needed for headache or mild pain.    Marland Kitchen alfuzosin (UROXATRAL) 10 MG 24 hr tablet Take 1 tablet (10 mg total) by mouth daily after supper. 30 tablet 0  . amLODipine (NORVASC) 10 MG tablet Take 10 mg by mouth daily.    Marland Kitchen aspirin 325 MG tablet Take 1 tablet (325 mg total) by mouth daily. 30 tablet 3  . atorvastatin (LIPITOR) 40 MG tablet Take 1 tablet (40 mg total) by mouth daily at 6 PM. 30 tablet 3  . cetirizine (ZYRTEC) 10 MG tablet Take 1 tablet (10 mg total) by  mouth daily.    . cholecalciferol (VITAMIN D) 1000 units tablet Take 2,000 Units by mouth daily.    . citalopram (CELEXA) 20 MG tablet Take 1 tablet (20 mg total) by mouth daily. 30 tablet 5  . clopidogrel (PLAVIX) 75 MG tablet Take 1 tablet (75 mg total) by mouth daily. 30 tablet 3  . colchicine 0.6 MG tablet Take 0.6 mg by mouth 3 (three) times daily as needed (for gout flares).     . divalproex (DEPAKOTE) 500 MG DR tablet Take 1 tablet (500 mg total) by mouth every 12 (twelve) hours. 30 tablet 5  . docusate sodium (COLACE) 100 MG capsule Take 2 capsules (200 mg total) by mouth at bedtime. (Patient taking differently: Take 100 mg by mouth at bedtime. ) 30 capsule 0  . gabapentin (NEURONTIN) 600 MG tablet Take 0.5 tablets (300 mg total) by mouth 3 (three) times daily. (Patient taking differently: Take 600 mg by mouth 3 (three) times daily. ) 90 tablet 0  . magnesium gluconate (MAGONATE) 500 MG tablet Take 500 mg by mouth 2 (two) times daily.    . Oxycodone HCl 20 MG TABS Take 1 tablet (20 mg total) by mouth every 8 (eight) hours as needed. (Patient taking differently: Take 20 mg by mouth See admin instructions. Take 20 mg by mouth every 4-6 hours as needed for pain) 1 tablet 0  . pantoprazole (PROTONIX) 40 MG tablet Take 1 tablet (40 mg total) by mouth daily. 30 tablet 0  . polyethylene glycol (MIRALAX / GLYCOLAX) packet Take 17 g by mouth daily. (Patient taking differently: Take 17 g by mouth daily as needed for mild constipation or moderate constipation. ) 14 each 0  . ramelteon (ROZEREM) 8 MG tablet Take 1 tablet (8 mg total) by mouth at bedtime. 30 tablet 0  . vitamin B-12 (CYANOCOBALAMIN) 100 MCG tablet Take 1 tablet (100 mcg total) by mouth daily. 30 tablet 1   Current Facility-Administered Medications on File Prior to Visit  Medication Dose Route Frequency Provider Last Rate Last Dose  . fentaNYL (SUBLIMAZE) injection    PRN Martinique, Peter M,  MD   25 mcg at 05/03/18 1336  . Heparin (Porcine)  in NaCl 1000-0.9 UT/500ML-% SOLN    PRN Martinique, Peter M, MD   500 mL at 05/03/18 1331  . Heparin (Porcine) in NaCl 1000-0.9 UT/500ML-% SOLN    PRN Martinique, Peter M, MD   500 mL at 05/03/18 1332  . heparin injection    PRN Martinique, Peter M, MD   4,000 Units at 05/03/18 1345  . iopamidol (ISOVUE-370) 76 % injection    PRN Martinique, Peter M, MD   85 mL at 05/03/18 1357  . lidocaine (PF) (XYLOCAINE) 1 % injection    PRN Martinique, Peter M, MD   2 mL at 05/03/18 1342  . midazolam (VERSED) injection    PRN Martinique, Peter M, MD   1 mg at 05/03/18 1336  . Radial Cocktail/Verapamil only    PRN Martinique, Peter M, MD        Allergies:   Allergies  Allergen Reactions  . Ace Inhibitors Shortness Of Breath, Swelling and Other (See Comments)    Angioedema (throat and tongue became swollen)  . Morphine Other (See Comments)    Causes confusion and "makes me crazy," per the patient  . Sulfa Antibiotics Other (See Comments)    Unknown reaction, from childhood     Physical Exam  There were no vitals filed for this visit. There is no height or weight on file to calculate BMI. No exam data present  General: well developed, well nourished, seated, in no evident distress Head: head normocephalic and atraumatic.   Neck: supple with no carotid or supraclavicular bruits Cardiovascular: regular rate and rhythm, no murmurs Musculoskeletal: no deformity Skin:  no rash/petichiae Vascular:  Normal pulses all extremities  Neurologic Exam Mental Status: Awake and fully alert. Oriented to place and time. Recent and remote memory intact. Attention span, concentration and fund of knowledge appropriate. Mood and affect appropriate.  Cranial Nerves: Fundoscopic exam reveals sharp disc margins. Pupils equal, briskly reactive to light. Extraocular movements full without nystagmus. Visual fields full to confrontation. Hearing intact. Facial sensation intact. Face, tongue, palate moves normally and symmetrically.  Motor: Normal  bulk and tone. Normal strength in all tested extremity muscles. Sensory.: intact to touch , pinprick , position and vibratory sensation.  Coordination: Rapid alternating movements normal in all extremities. Finger-to-nose and heel-to-shin performed accurately bilaterally. Gait and Station: Arises from chair without difficulty. Stance is normal. Gait demonstrates normal stride length and balance . Able to heel, toe and tandem walk without difficulty.  Reflexes: 1+ and symmetric. Toes downgoing.    NIHSS  *** Modified Rankin  ***    Diagnostic Data (Labs, Imaging, Testing)  Ct Angio Head W Or Wo Contrast Ct Angio Neck W And/or Wo Contrast 04/25/2018   ADDENDUM  Cortical hypoattenuation along the right pre and postcentral gyrus is consistent with acute/subacute nonhemorrhagic infarct there is effacement adjacent sulci.  Associated subacute arachnoid hemorrhage is considered less likely.  This is likely all related to a subacute ischemic event.  IMPRESSION:  1. Atherosclerotic changes within the mid and distal right common carotid artery narrows the lumen to 3.5 mm without a significant stenosis relative to the more distal vessel.  2. Atherosclerotic changes bilaterally at the carotid bifurcations without significant stenoses.  3. Atherosclerotic changes at the dural margin of the left vertebral artery without a significant stenosis.  4. Atherosclerotic changes of the cavernous internal carotid arteries bilaterally without significant stenosis.  5. Distal medium and small vessel disease  is present throughout the anterior and posterior circulation without a significant proximal stenosis, aneurysm, or branch vessel occlusion otherwise.  6. Multilevel degenerative changes of the cervical spine as described above. Grade 1 anterolisthesis is present at C3-4.   Ct Head Wo Contrast 04/26/2018 IMPRESSION:  Increased conspicuity of multiple small cortical infarctions in the right posterior  occipital and parietal lobes.  No new stroke, hemorrhage, or mass effect identified.   EEG 04/26/2018 IMPRESSION: This is an abnormal EEG secondary to posterior background slowing. This finding may be seen with a diffuse gray matter disturbance that is etiologically nonspecific, but may include a dementia, among other possibilities. No epileptiform activity is noted.  Transthoracic Echocardiogram - Left ventricle: The cavity size was normal. Wall thickness was increased in a pattern of mild LVH. Systolic function was normal. The estimated ejection fraction was in the range of 60% to 65%.  LE venous doppler There is no DVT or SVT noted in the bilateral lower extremities.  TEE Normal LV size and systolic function, EF 50-53%. Mild LV hypertrophy. Normal RV size and systolic function. Normal right atrial size. Normal left atrial size, no LA appendage thrombus. No PFO or ASD, negative bubble study. No significant TR. Trivial MR. Trileaflet aortic valve without stenosis or regurgitation. Normal caliber thoracic aorta with grade 3 plaque in descending thoracic aorta.   Ct Head Wo Contrast 04/27/2018 IMPRESSION: 1. No acute hemorrhage. 2. Unchanged appearance of multiple areas of acute to subacute ischemia in the right parietal and occipital lobes.     ASSESSMENT: William Jordan is a 72 y.o. year old male here with cortical right MCA frontal and occipital infarct on 04/25/2018 secondary to possible cardioembolic versus right soft plaque CCA. Vascular risk factors include HTN, HLD, EtOH/TCH use disorder.  Admission complicated by possible seizure activity, and STEMI with nonobstructive CAD.     PLAN: -Continue {anticoagulants:31417}  and ***  for secondary stroke prevention -F/u with PCP regarding your *** management -continue to monitor BP at home -advised to continue to stay active and maintain a healthy diet -Maintain strict control of hypertension with blood pressure goal  below 130/90, diabetes with hemoglobin A1c goal below 6.5% and cholesterol with LDL cholesterol (bad cholesterol) goal below 70 mg/dL. I also advised the patient to eat a healthy diet with plenty of whole grains, cereals, fruits and vegetables, exercise regularly and maintain ideal body weight.  Follow up in *** or call earlier if needed   Greater than 50% of time during this 25 minute visit was spent on counseling,explanation of diagnosis of ***, reviewing risk factor management of ***, planning of further management, discussion with patient and family and coordination of care    Venancio Poisson, [facility]  [facility] [address] [place],  [address]  Phone [phone] Fax [phone] Note: This document was prepared with digital dictation and possible smart phrase technology. Any transcriptional errors that result from this process are unintentional.

## 2018-06-28 NOTE — Telephone Encounter (Signed)
It is highly recommended the patient be followed in this office for stroke follow-up due to apparent continued deficits per prior office visits with other providers.  Patient also has a loop recorder implant that was placed during hospital admission to rule out possible atrial fibrillation as cause of stroke.  He was also recently started on Depakote after stopping Keppra after recent hospital admission and it is unclear to reason why he was restarted on Keppra after it was recommended that patient does not need AED treatment.  He also needs to be ensure that patient completes 3 months DAPT as recommended during admission on 04/25/2018 due to right CCA stenosis.  32-month DAPT completed after 07/26/2018 and was recommended to continue Plavix alone.  It appears as though patient has numerous no shows/cancellations with other providers such as cardiology.  Patient has also had multiple ED visits due to somnolence, aggressive behaviors and cognitive concerns.  Will attempt to reach out to PCP to make them aware.

## 2018-06-28 NOTE — Telephone Encounter (Signed)
RN receive a message from phone room that pt cancel appt. PT told Arlyss Gandy that he feels appt is not needed. Pt was schedule to be seen for a hospital follow up for stroke.Rn will send notes to Janett Billow NP to make her aware.

## 2018-06-30 ENCOUNTER — Emergency Department (HOSPITAL_COMMUNITY)
Admission: EM | Admit: 2018-06-30 | Discharge: 2018-07-01 | Disposition: A | Discharge status: Home / Self Care | Payer: PPO | Attending: Emergency Medicine | Admitting: Emergency Medicine

## 2018-06-30 ENCOUNTER — Encounter (HOSPITAL_COMMUNITY): Payer: Self-pay | Admitting: Emergency Medicine

## 2018-06-30 ENCOUNTER — Other Ambulatory Visit: Payer: Self-pay

## 2018-06-30 DIAGNOSIS — I129 Hypertensive chronic kidney disease with stage 1 through stage 4 chronic kidney disease, or unspecified chronic kidney disease: Secondary | ICD-10-CM | POA: Diagnosis not present

## 2018-06-30 DIAGNOSIS — R1110 Vomiting, unspecified: Secondary | ICD-10-CM | POA: Insufficient documentation

## 2018-06-30 DIAGNOSIS — Z7982 Long term (current) use of aspirin: Secondary | ICD-10-CM | POA: Diagnosis not present

## 2018-06-30 DIAGNOSIS — Z8673 Personal history of transient ischemic attack (TIA), and cerebral infarction without residual deficits: Secondary | ICD-10-CM | POA: Diagnosis not present

## 2018-06-30 DIAGNOSIS — Z96641 Presence of right artificial hip joint: Secondary | ICD-10-CM | POA: Insufficient documentation

## 2018-06-30 DIAGNOSIS — Z96652 Presence of left artificial knee joint: Secondary | ICD-10-CM | POA: Diagnosis not present

## 2018-06-30 DIAGNOSIS — N289 Disorder of kidney and ureter, unspecified: Secondary | ICD-10-CM | POA: Diagnosis not present

## 2018-06-30 DIAGNOSIS — R32 Unspecified urinary incontinence: Secondary | ICD-10-CM | POA: Diagnosis not present

## 2018-06-30 DIAGNOSIS — R0902 Hypoxemia: Secondary | ICD-10-CM | POA: Diagnosis not present

## 2018-06-30 DIAGNOSIS — Z7902 Long term (current) use of antithrombotics/antiplatelets: Secondary | ICD-10-CM | POA: Diagnosis not present

## 2018-06-30 DIAGNOSIS — I2510 Atherosclerotic heart disease of native coronary artery without angina pectoris: Secondary | ICD-10-CM | POA: Insufficient documentation

## 2018-06-30 DIAGNOSIS — D649 Anemia, unspecified: Secondary | ICD-10-CM | POA: Insufficient documentation

## 2018-06-30 DIAGNOSIS — Z79899 Other long term (current) drug therapy: Secondary | ICD-10-CM | POA: Insufficient documentation

## 2018-06-30 DIAGNOSIS — R4020 Unspecified coma: Secondary | ICD-10-CM | POA: Diagnosis not present

## 2018-06-30 DIAGNOSIS — R55 Syncope and collapse: Secondary | ICD-10-CM

## 2018-06-30 DIAGNOSIS — N189 Chronic kidney disease, unspecified: Secondary | ICD-10-CM | POA: Diagnosis not present

## 2018-06-30 DIAGNOSIS — I959 Hypotension, unspecified: Secondary | ICD-10-CM | POA: Diagnosis not present

## 2018-06-30 DIAGNOSIS — F17290 Nicotine dependence, other tobacco product, uncomplicated: Secondary | ICD-10-CM | POA: Insufficient documentation

## 2018-06-30 DIAGNOSIS — R531 Weakness: Secondary | ICD-10-CM | POA: Diagnosis not present

## 2018-06-30 LAB — CBG MONITORING, ED: Glucose-Capillary: 105 mg/dL — ABNORMAL HIGH (ref 70–99)

## 2018-06-30 NOTE — ED Provider Notes (Signed)
[facility] EMERGENCY DEPARTMENT Provider Note   CSN: [id number] Arrival date & time: 06/30/18  2321     History   Chief Complaint Chief Complaint  Patient presents with  . Loss of Consciousness    HPI William Jordan is a 72 y.o. male.  The history is provided by the patient.  He has history of hypertension, coronary artery disease status post non-STEMI, stroke, focal motor seizure, who comes in following a syncopal episode.  He states that he had met a friend at a bar, but was having difficulty with his balance on getting home.  He had to be assisted into the house.  He was sitting in a chair where he apparently passed out.  He did not fall out of his chair, did not hit his head.  He does not recall any palpitations or dizziness or any other symptoms before passing out.  He did vomit and had urinary incontinence.  He is unsure how long he was unconscious.  EMS reported initial blood pressure of 80 systolic.  He feels like he is back to his baseline.  He states he only had one beer when he was at the bar.  He states he has been compliant with all of his medications.  Past Medical History:  Diagnosis Date  . Angioedema    from Benazepril reaction  . Anxiety    takes Celexa  . Arthritis   . Chronic back pain   . CVA (cerebral vascular accident) ([place])   . Enlarged prostate   . History of blood transfusion   . Hypertension   . Neuropathy     Patient Active Problem List   Diagnosis Date Noted  . Left leg swelling 05/16/2018  . Alcohol abuse 05/16/2018  . Depression 05/16/2018  . Toxic encephalopathy   . Marijuana abuse   . Encephalopathy 05/15/2018  . Gait disturbance, post-stroke   . History of CVA (cerebrovascular accident) 05/04/2018  . Spastic hemiparesis of left nondominant side due to acute cerebral infarction ([place])   . 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[place])   . Essential hypertension   . Hyponatremia   . Non-ST elevation (NSTEMI) myocardial infarction ([place])     . Ventricular tachycardia ([place])   . CVA (cerebral vascular accident) ([place]) 04/25/2018  . Acute kidney injury ([place]) 12/10/2017  . Acute renal failure syndrome ([place])   . Low back pain with sciatica 04/03/2015  . H/O total hip arthroplasty 04/03/2015  . Idiopathic peripheral neuropathy 09/29/2014  . Chronic pain 09/28/2014  . DDD (degenerative disc disease), lumbar 12/30/2012  . Degenerative arthritis of thoracic spine 12/30/2012  . Localized osteoarthrosis 12/13/2012  . Chronic pain associated with significant psychosocial dysfunction 12/13/2012  . Polypharmacy 12/13/2012  . Long term current use of opiate analgesic 12/13/2012    Past Surgical History:  Procedure Laterality Date  . CARDIAC CATHETERIZATION  05/03/2018  . COLONOSCOPY     one polyp removed - benign  . ELBOW SURGERY Right   . HERNIA REPAIR     umbilical  . JOINT REPLACEMENT Left    knee, x 2  . JOINT REPLACEMENT Right    hip  . JOINT REPLACEMENT Left    partial shoulder  . LEFT HEART CATH AND CORONARY ANGIOGRAPHY N/A 05/03/2018   Procedure: LEFT HEART CATH AND CORONARY ANGIOGRAPHY;  Surgeon: Martinique, Peter M, MD;  Location: [facility];  Service: Cardiovascular;  Laterality: N/A;  . LOOP RECORDER INSERTION N/A 04/27/2018   Procedure: LOOP RECORDER INSERTION;  Surgeon: Evans Lance, MD;  Location: [facility];  Service: Cardiovascular;  Laterality: N/A;  . SPINAL CORD STIMULATOR INSERTION N/A 09/28/2014   Procedure: LUMBAR SPINAL CORD STIMULATOR INSERTION;  Surgeon: Melina Schools, MD;  Location: [facility];  Service: Orthopedics;  Laterality: N/A;  . TEE WITHOUT CARDIOVERSION N/A 04/27/2018   Procedure: TRANSESOPHAGEAL ECHOCARDIOGRAM (TEE);  Surgeon: Larey Dresser, MD;  Location: [facility];  Service: Cardiovascular;  Laterality: N/A;  . TONSILLECTOMY    . VASECTOMY          Home Medications    Prior to Admission medications   Medication Sig Start Date End Date Taking? Authorizing Provider   acetaminophen (TYLENOL) 325 MG tablet Take 2 tablets (650 mg total) by mouth every 4 (four) hours as needed for headache or mild pain. 05/06/18   Angiulli, Lavon Paganini, PA-C  alfuzosin (UROXATRAL) 10 MG 24 hr tablet Take 1 tablet (10 mg total) by mouth daily after supper. 05/06/18   Angiulli, Lavon Paganini, PA-C  amLODipine (NORVASC) 10 MG tablet Take 10 mg by mouth daily.    [provider]  aspirin 325 MG tablet Take 1 tablet (325 mg total) by mouth daily. 04/29/18   Asencion Noble, MD  atorvastatin (LIPITOR) 40 MG tablet Take 1 tablet (40 mg total) by mouth daily at 6 PM. 05/06/18   Angiulli, Lavon Paganini, PA-C  cetirizine (ZYRTEC) 10 MG tablet Take 1 tablet (10 mg total) by mouth daily. 05/17/18   Seawell, Jaimie A, DO  cholecalciferol (VITAMIN D) 1000 units tablet Take 2,000 Units by mouth daily.    [provider]  citalopram (CELEXA) 20 MG tablet Take 1 tablet (20 mg total) by mouth daily. 05/18/18   Seawell, Jaimie A, DO  clopidogrel (PLAVIX) 75 MG tablet Take 1 tablet (75 mg total) by mouth daily. 05/06/18   Angiulli, Lavon Paganini, PA-C  colchicine 0.6 MG tablet Take 0.6 mg by mouth 3 (three) times daily as needed (for gout flares).     [provider]  divalproex (DEPAKOTE) 500 MG DR tablet Take 1 tablet (500 mg total) by mouth every 12 (twelve) hours. 05/17/18   Seawell, Jaimie A, DO  docusate sodium (COLACE) 100 MG capsule Take 2 capsules (200 mg total) by mouth at bedtime. Patient taking differently: Take 100 mg by mouth at bedtime.  10/02/15   Debbe Odea, MD  gabapentin (NEURONTIN) 600 MG tablet Take 0.5 tablets (300 mg total) by mouth 3 (three) times daily. Patient taking differently: Take 600 mg by mouth 3 (three) times daily.  05/06/18   Angiulli, Lavon Paganini, PA-C  magnesium gluconate (MAGONATE) 500 MG tablet Take 500 mg by mouth 2 (two) times daily.    [provider]  Oxycodone HCl 20 MG TABS Take 1 tablet (20 mg total) by mouth every 8 (eight) hours as  needed. Patient taking differently: Take 20 mg by mouth See admin instructions. Take 20 mg by mouth every 4-6 hours as needed for pain 12/12/17   Roxan Hockey, MD  pantoprazole (PROTONIX) 40 MG tablet Take 1 tablet (40 mg total) by mouth daily. 05/07/18   Angiulli, Lavon Paganini, PA-C  polyethylene glycol (MIRALAX / GLYCOLAX) packet Take 17 g by mouth daily. Patient taking differently: Take 17 g by mouth daily as needed for mild constipation or moderate constipation.  06/12/15   Eugenie Filler, MD  ramelteon (ROZEREM) 8 MG tablet Take 1 tablet (8 mg total) by mouth at bedtime. 05/06/18   [place], Lavon Paganini,  PA-C  vitamin B-12 (CYANOCOBALAMIN) 100 MCG tablet Take 1 tablet (100 mcg total) by mouth daily. 08/17/15   Caren Griffins, MD    Family History Family History  Problem Relation Age of Onset  . Heart disease Father     Social History Social History   Tobacco Use  . Smoking status: Current Every Day Smoker    Types: E-cigarettes, Cigars  . Smokeless tobacco: Former Systems developer    Types: Snuff  . Tobacco comment: does not use any tobacco products any more  Substance Use Topics  . Alcohol use: Yes    Comment: occ  . Drug use: No     Allergies   Ace inhibitors; Morphine; and Sulfa antibiotics   Review of Systems Review of Systems  All other systems reviewed and are negative.    Physical Exam Updated Vital Signs BP 100/61   Pulse 72   Temp 97.9 F (36.6 C) (Oral)   Resp 13   Ht '5\' 10"'  (1.778 m)   Wt 81.6 kg   SpO2 98%   BMI 25.83 kg/m   Physical Exam  Nursing note and vitals reviewed.  73 year old male, resting comfortably and in no acute distress. Vital signs are significant for borderline hypotension. Oxygen saturation is 98%, which is normal. Head is normocephalic and atraumatic. PERRLA, EOMI. Oropharynx is clear. Neck is nontender and supple without adenopathy or JVD. Back is nontender and there is no CVA tenderness. Lungs are clear without rales, wheezes, or  rhonchi. Chest is nontender. Heart has regular rate and rhythm without murmur. Abdomen is soft, flat, nontender without masses or hepatosplenomegaly and peristalsis is normoactive. Extremities have no cyanosis or edema, full range of motion is present. Skin is warm and dry without rash. Neurologic: Mental status is normal, cranial nerves are intact, there are no motor or sensory deficits.  ED Treatments / Results  Labs (all labs ordered are listed, but only abnormal results are displayed) Labs Reviewed  CBG MONITORING, ED - Abnormal; Notable for the following components:      Result Value   Glucose-Capillary 105 (*)    All other components within normal limits  BASIC METABOLIC PANEL  URINALYSIS, ROUTINE W REFLEX MICROSCOPIC  CBC WITH DIFFERENTIAL/PLATELET  VALPROIC ACID LEVEL  CK  ETHANOL  I-STAT TROPONIN, ED    EKG EKG Interpretation  Date/Time:  Wednesday June 30 2018 23:40:25 EDT Ventricular Rate:  70 PR Interval:    QRS Duration: 87 QT Interval:  397 QTC Calculation: 429 R Axis:   89 Text Interpretation:  Sinus rhythm Borderline right axis deviation When compared with ECG of 05/15/2018, No significant change was found Confirmed by Delora Fuel ([id number]) on 06/30/2018 11:51:16 PM   Radiology No results found.  Procedures Procedures   Medications Ordered in ED Medications - No data to display   Initial Impression / Assessment and Plan / ED Course  I have reviewed the triage vital signs and the nursing notes.  Pertinent labs & imaging results that were available during my care of the patient were reviewed by me and considered in my medical decision making (see chart for details).  Syncopal episode which may have been related to hypotension, possible seizure.  Old records reviewed confirming recent hospitalizations for non-STEMI, stroke, and encephalopathy secondary to polypharmacy.  At last admission, levetiracetam was discontinued and he was started on valproic  acid.  Will check valproic acid level.  ECG shows no acute changes, will check troponin.  Nursing triage note states  patient did not vomit.  Patient is clear to me that he did vomit.  Also, his wife has arrived and confirms he vomited and had urinary incontinence.  Laboratory work-up shows mild renal insufficiency, which she has had in the past.  Anemia is also present, unchanged from baseline.  Valproic acid level is subtherapeutic.  He is given a dose of valproic acid level in the ED.  This increases the likelihood that seizure was the cause of his syncope tonight.  Orthostatic vital signs were checked, and there was no significant change in pulse or blood pressure.  He was ambulated in the ED and noted to be steady on his feet.  He was felt to be safe for discharge.  He is advised to increase his divalproex to 1500 mg a day, follow-up with PCP in 1 week.  Final Clinical Impressions(s) / ED Diagnoses   Final diagnoses:  Syncope and collapse  Renal insufficiency  Normochromic normocytic anemia    ED Discharge Orders    None       Delora Fuel, MD 75/64/33 [phone]

## 2018-06-30 NOTE — ED Triage Notes (Signed)
Per EMS pt was at home with wife and sitting in chair and wife saw him pass out . Did not fall out of chair nor hit his head. Pt stated he has been feeling very weird this evening. No chest pain no vomiting. Upon ems arrival pt was very lethargic and pale. No diaphoretic, skin was warm and dry.90/50, hr 80's and O2 upon arrival was low 90"s.

## 2018-07-01 LAB — CBC WITH DIFFERENTIAL/PLATELET
Abs Immature Granulocytes: 0.13 10*3/uL — ABNORMAL HIGH (ref 0.00–0.07)
BASOS PCT: 0 %
Basophils Absolute: 0.1 10*3/uL (ref 0.0–0.1)
EOS PCT: 3 %
Eosinophils Absolute: 0.4 10*3/uL (ref 0.0–0.5)
HCT: 32.3 % — ABNORMAL LOW (ref 39.0–52.0)
Hemoglobin: 10.1 g/dL — ABNORMAL LOW (ref 13.0–17.0)
Immature Granulocytes: 1 %
LYMPHS ABS: 1.6 10*3/uL (ref 0.7–4.0)
Lymphocytes Relative: 12 %
MCH: 28.1 pg (ref 26.0–34.0)
MCHC: 31.3 g/dL (ref 30.0–36.0)
MCV: 89.7 fL (ref 80.0–100.0)
MONOS PCT: 9 %
Monocytes Absolute: 1.2 10*3/uL — ABNORMAL HIGH (ref 0.1–1.0)
NRBC: 0 % (ref 0.0–0.2)
Neutro Abs: 10.7 10*3/uL — ABNORMAL HIGH (ref 1.7–7.7)
Neutrophils Relative %: 75 %
Platelets: 255 10*3/uL (ref 150–400)
RBC: 3.6 MIL/uL — ABNORMAL LOW (ref 4.22–5.81)
RDW: 14.9 % (ref 11.5–15.5)
WBC: 14.1 10*3/uL — ABNORMAL HIGH (ref 4.0–10.5)

## 2018-07-01 LAB — URINALYSIS, ROUTINE W REFLEX MICROSCOPIC
BILIRUBIN URINE: NEGATIVE
GLUCOSE, UA: NEGATIVE mg/dL
Hgb urine dipstick: NEGATIVE
KETONES UR: NEGATIVE mg/dL
LEUKOCYTES UA: NEGATIVE
NITRITE: NEGATIVE
PH: 6 (ref 5.0–8.0)
PROTEIN: NEGATIVE mg/dL
Specific Gravity, Urine: 1.013 (ref 1.005–1.030)

## 2018-07-01 LAB — VALPROIC ACID LEVEL: Valproic Acid Lvl: 12 ug/mL — ABNORMAL LOW (ref 50.0–100.0)

## 2018-07-01 LAB — BASIC METABOLIC PANEL
ANION GAP: 11 (ref 5–15)
BUN: 16 mg/dL (ref 8–23)
CALCIUM: 8.5 mg/dL — AB (ref 8.9–10.3)
CO2: 22 mmol/L (ref 22–32)
CREATININE: 1.36 mg/dL — AB (ref 0.61–1.24)
Chloride: 99 mmol/L (ref 98–111)
GFR, EST AFRICAN AMERICAN: 59 mL/min — AB (ref >60)
GFR, EST NON AFRICAN AMERICAN: 51 mL/min — AB (ref >60)
Glucose, Bld: 116 mg/dL — ABNORMAL HIGH (ref 70–99)
Potassium: 3.6 mmol/L (ref 3.5–5.1)
Sodium: 132 mmol/L — ABNORMAL LOW (ref 135–145)

## 2018-07-01 LAB — I-STAT TROPONIN, ED: TROPONIN I, POC: 0 ng/mL (ref 0.00–0.08)

## 2018-07-01 LAB — ETHANOL: Alcohol, Ethyl (B): <10 mg/dL (ref <10)

## 2018-07-01 LAB — CK: Total CK: 49 U/L (ref 49–397)

## 2018-07-01 MED ORDER — VALPROATE SODIUM 500 MG/5ML IV SOLN
500.0000 mg | Freq: Once | INTRAVENOUS | Status: AC
  Administered 2018-07-01: 500 mg via INTRAVENOUS
  Filled 2018-07-01: qty 5

## 2018-07-01 NOTE — Discharge Instructions (Addendum)
Increase your divalproex to two tablets (1000 mg) at night, continue to take one tablet (500 mg) in the morning.

## 2018-07-01 NOTE — ED Notes (Signed)
Ambulated pt in hallway, pt complaints about bottom of feet hurting. Pt had steady gait. Notified Jessica(RN)

## 2018-07-02 ENCOUNTER — Encounter: Payer: PPO | Admitting: *Deleted

## 2018-07-05 ENCOUNTER — Telehealth: Payer: Self-pay

## 2018-07-05 NOTE — Telephone Encounter (Signed)
LMOVM requesting that pt send manual transmission b/c home monitor has not updated in at least 14 days.    

## 2018-07-12 ENCOUNTER — Encounter: Payer: Self-pay | Admitting: Cardiology

## 2018-07-26 DIAGNOSIS — E7800 Pure hypercholesterolemia, unspecified: Secondary | ICD-10-CM | POA: Diagnosis not present

## 2018-07-26 DIAGNOSIS — Z1389 Encounter for screening for other disorder: Secondary | ICD-10-CM | POA: Diagnosis not present

## 2018-07-26 DIAGNOSIS — I679 Cerebrovascular disease, unspecified: Secondary | ICD-10-CM | POA: Diagnosis not present

## 2018-07-26 DIAGNOSIS — Z0000 Encounter for general adult medical examination without abnormal findings: Secondary | ICD-10-CM | POA: Diagnosis not present

## 2018-07-26 DIAGNOSIS — Z87891 Personal history of nicotine dependence: Secondary | ICD-10-CM | POA: Diagnosis not present

## 2018-07-26 DIAGNOSIS — I2510 Atherosclerotic heart disease of native coronary artery without angina pectoris: Secondary | ICD-10-CM | POA: Diagnosis not present

## 2018-07-26 DIAGNOSIS — N400 Enlarged prostate without lower urinary tract symptoms: Secondary | ICD-10-CM | POA: Diagnosis not present

## 2018-07-26 DIAGNOSIS — I10 Essential (primary) hypertension: Secondary | ICD-10-CM | POA: Diagnosis not present

## 2018-08-04 ENCOUNTER — Ambulatory Visit (INDEPENDENT_AMBULATORY_CARE_PROVIDER_SITE_OTHER): Payer: PPO

## 2018-08-04 DIAGNOSIS — I63411 Cerebral infarction due to embolism of right middle cerebral artery: Secondary | ICD-10-CM

## 2018-08-05 DIAGNOSIS — G603 Idiopathic progressive neuropathy: Secondary | ICD-10-CM | POA: Diagnosis not present

## 2018-08-05 DIAGNOSIS — Z79891 Long term (current) use of opiate analgesic: Secondary | ICD-10-CM | POA: Diagnosis not present

## 2018-08-05 DIAGNOSIS — M47816 Spondylosis without myelopathy or radiculopathy, lumbar region: Secondary | ICD-10-CM | POA: Diagnosis not present

## 2018-08-05 DIAGNOSIS — G894 Chronic pain syndrome: Secondary | ICD-10-CM | POA: Diagnosis not present

## 2018-08-05 NOTE — Progress Notes (Signed)
Carelink Summary Report / Loop Recorder 

## 2018-08-11 ENCOUNTER — Other Ambulatory Visit: Payer: Self-pay | Admitting: Internal Medicine

## 2018-08-16 DIAGNOSIS — G40909 Epilepsy, unspecified, not intractable, without status epilepticus: Secondary | ICD-10-CM | POA: Diagnosis not present

## 2018-08-16 DIAGNOSIS — D649 Anemia, unspecified: Secondary | ICD-10-CM | POA: Diagnosis not present

## 2018-08-24 ENCOUNTER — Ambulatory Visit: Payer: PPO | Admitting: Cardiovascular Disease

## 2018-08-24 DIAGNOSIS — D649 Anemia, unspecified: Secondary | ICD-10-CM | POA: Diagnosis not present

## 2018-08-26 ENCOUNTER — Encounter: Payer: Self-pay | Admitting: *Deleted

## 2018-08-27 DIAGNOSIS — Z1212 Encounter for screening for malignant neoplasm of rectum: Secondary | ICD-10-CM | POA: Diagnosis not present

## 2018-08-30 ENCOUNTER — Encounter: Payer: Self-pay | Admitting: *Deleted

## 2018-08-30 ENCOUNTER — Other Ambulatory Visit: Payer: Self-pay | Admitting: *Deleted

## 2018-08-30 NOTE — Patient Outreach (Signed)
HTA High Risk Outreach Screening. I was able to speak with William Jordan today. He was very forthcoming with sharing his medical status with me. He states he is doing much better since he was diagnosed with Iron Deficeincy Anemia and has had one infusion and will be having another one tomorrow. He is able to tell me his meds off the top of his head and how he takes them and what they are for. He is being compliant with his medication regimen which was not a priority for him before.He is very interested in staying out of the hospital and is taking his health more seriously now.   He acknowledges that he knows about [facility] and having had some contact with [place] in the past.  I advised that if he needs anything he can call me and I would be glad to assist him.  I will send him a successful letter. No needs at this time.  Eulah Pont. Myrtie Neither, MSN, [facility] Gerontological Nurse Practitioner [facility] Management [phone]

## 2018-09-06 ENCOUNTER — Ambulatory Visit (INDEPENDENT_AMBULATORY_CARE_PROVIDER_SITE_OTHER): Payer: PPO

## 2018-09-06 DIAGNOSIS — I63411 Cerebral infarction due to embolism of right middle cerebral artery: Secondary | ICD-10-CM | POA: Diagnosis not present

## 2018-09-06 LAB — CUP PACEART REMOTE DEVICE CHECK
Implantable Pulse Generator Implant Date: 20190813
MDC IDC SESS DTM: 20191223144201

## 2018-09-07 NOTE — Progress Notes (Signed)
Carelink Summary Report / Loop Recorder 

## 2018-09-19 LAB — CUP PACEART REMOTE DEVICE CHECK
Implantable Pulse Generator Implant Date: 20190813
MDC IDC SESS DTM: 20191120144105

## 2018-09-20 DIAGNOSIS — J209 Acute bronchitis, unspecified: Secondary | ICD-10-CM | POA: Diagnosis not present

## 2018-09-30 DIAGNOSIS — M47816 Spondylosis without myelopathy or radiculopathy, lumbar region: Secondary | ICD-10-CM | POA: Diagnosis not present

## 2018-09-30 DIAGNOSIS — G894 Chronic pain syndrome: Secondary | ICD-10-CM | POA: Diagnosis not present

## 2018-09-30 DIAGNOSIS — Z79891 Long term (current) use of opiate analgesic: Secondary | ICD-10-CM | POA: Diagnosis not present

## 2018-09-30 DIAGNOSIS — G603 Idiopathic progressive neuropathy: Secondary | ICD-10-CM | POA: Diagnosis not present

## 2018-10-11 ENCOUNTER — Ambulatory Visit (INDEPENDENT_AMBULATORY_CARE_PROVIDER_SITE_OTHER): Payer: PPO

## 2018-10-11 DIAGNOSIS — I63411 Cerebral infarction due to embolism of right middle cerebral artery: Secondary | ICD-10-CM

## 2018-10-12 LAB — CUP PACEART REMOTE DEVICE CHECK
Date Time Interrogation Session: 20200125150621
Implantable Pulse Generator Implant Date: 20190813

## 2018-10-12 NOTE — Progress Notes (Signed)
Carelink Summary Report / Loop Recorder 

## 2018-10-18 DIAGNOSIS — N401 Enlarged prostate with lower urinary tract symptoms: Secondary | ICD-10-CM | POA: Diagnosis not present

## 2018-10-25 DIAGNOSIS — G4700 Insomnia, unspecified: Secondary | ICD-10-CM | POA: Diagnosis not present

## 2018-10-26 ENCOUNTER — Encounter: Payer: Self-pay | Admitting: Internal Medicine

## 2018-11-04 DIAGNOSIS — I82409 Acute embolism and thrombosis of unspecified deep veins of unspecified lower extremity: Secondary | ICD-10-CM | POA: Diagnosis not present

## 2018-11-04 DIAGNOSIS — M79661 Pain in right lower leg: Secondary | ICD-10-CM | POA: Diagnosis not present

## 2018-11-04 DIAGNOSIS — M109 Gout, unspecified: Secondary | ICD-10-CM | POA: Diagnosis not present

## 2018-11-04 DIAGNOSIS — R600 Localized edema: Secondary | ICD-10-CM | POA: Diagnosis not present

## 2018-11-04 DIAGNOSIS — M79662 Pain in left lower leg: Secondary | ICD-10-CM | POA: Diagnosis not present

## 2018-11-05 ENCOUNTER — Inpatient Hospital Stay (HOSPITAL_COMMUNITY)
Admission: EM | Admit: 2018-11-05 | Discharge: 2018-11-07 | DRG: 092 | Disposition: A | Discharge status: Home / Self Care | Payer: PPO | Attending: Internal Medicine | Admitting: Internal Medicine

## 2018-11-05 ENCOUNTER — Emergency Department (HOSPITAL_BASED_OUTPATIENT_CLINIC_OR_DEPARTMENT_OTHER)
Admit: 2018-11-05 | Discharge: 2018-11-05 | Disposition: A | Discharge status: Home / Self Care | Payer: PPO | Attending: Emergency Medicine | Admitting: Emergency Medicine

## 2018-11-05 ENCOUNTER — Emergency Department (HOSPITAL_COMMUNITY): Payer: PPO

## 2018-11-05 ENCOUNTER — Encounter (HOSPITAL_COMMUNITY): Payer: Self-pay | Admitting: Emergency Medicine

## 2018-11-05 ENCOUNTER — Other Ambulatory Visit: Payer: Self-pay

## 2018-11-05 DIAGNOSIS — I252 Old myocardial infarction: Secondary | ICD-10-CM

## 2018-11-05 DIAGNOSIS — F17290 Nicotine dependence, other tobacco product, uncomplicated: Secondary | ICD-10-CM | POA: Diagnosis present

## 2018-11-05 DIAGNOSIS — G3184 Mild cognitive impairment, so stated: Secondary | ICD-10-CM | POA: Diagnosis not present

## 2018-11-05 DIAGNOSIS — R52 Pain, unspecified: Secondary | ICD-10-CM | POA: Diagnosis not present

## 2018-11-05 DIAGNOSIS — M549 Dorsalgia, unspecified: Secondary | ICD-10-CM | POA: Diagnosis not present

## 2018-11-05 DIAGNOSIS — Z882 Allergy status to sulfonamides status: Secondary | ICD-10-CM

## 2018-11-05 DIAGNOSIS — K219 Gastro-esophageal reflux disease without esophagitis: Secondary | ICD-10-CM | POA: Diagnosis not present

## 2018-11-05 DIAGNOSIS — R404 Transient alteration of awareness: Secondary | ICD-10-CM | POA: Diagnosis not present

## 2018-11-05 DIAGNOSIS — Z96612 Presence of left artificial shoulder joint: Secondary | ICD-10-CM | POA: Diagnosis present

## 2018-11-05 DIAGNOSIS — M7989 Other specified soft tissue disorders: Secondary | ICD-10-CM | POA: Diagnosis not present

## 2018-11-05 DIAGNOSIS — R05 Cough: Secondary | ICD-10-CM | POA: Diagnosis not present

## 2018-11-05 DIAGNOSIS — F419 Anxiety disorder, unspecified: Secondary | ICD-10-CM | POA: Diagnosis present

## 2018-11-05 DIAGNOSIS — G8929 Other chronic pain: Secondary | ICD-10-CM | POA: Diagnosis present

## 2018-11-05 DIAGNOSIS — R410 Disorientation, unspecified: Secondary | ICD-10-CM | POA: Diagnosis not present

## 2018-11-05 DIAGNOSIS — R4182 Altered mental status, unspecified: Secondary | ICD-10-CM | POA: Diagnosis not present

## 2018-11-05 DIAGNOSIS — G629 Polyneuropathy, unspecified: Secondary | ICD-10-CM | POA: Diagnosis not present

## 2018-11-05 DIAGNOSIS — Z7902 Long term (current) use of antithrombotics/antiplatelets: Secondary | ICD-10-CM | POA: Diagnosis not present

## 2018-11-05 DIAGNOSIS — F1210 Cannabis abuse, uncomplicated: Secondary | ICD-10-CM | POA: Diagnosis present

## 2018-11-05 DIAGNOSIS — G40909 Epilepsy, unspecified, not intractable, without status epilepticus: Secondary | ICD-10-CM | POA: Diagnosis present

## 2018-11-05 DIAGNOSIS — Z8673 Personal history of transient ischemic attack (TIA), and cerebral infarction without residual deficits: Secondary | ICD-10-CM

## 2018-11-05 DIAGNOSIS — Z888 Allergy status to other drugs, medicaments and biological substances status: Secondary | ICD-10-CM

## 2018-11-05 DIAGNOSIS — F329 Major depressive disorder, single episode, unspecified: Secondary | ICD-10-CM | POA: Diagnosis not present

## 2018-11-05 DIAGNOSIS — Z79899 Other long term (current) drug therapy: Secondary | ICD-10-CM

## 2018-11-05 DIAGNOSIS — Z885 Allergy status to narcotic agent status: Secondary | ICD-10-CM

## 2018-11-05 DIAGNOSIS — G9340 Encephalopathy, unspecified: Secondary | ICD-10-CM | POA: Diagnosis present

## 2018-11-05 DIAGNOSIS — Z7982 Long term (current) use of aspirin: Secondary | ICD-10-CM

## 2018-11-05 DIAGNOSIS — G92 Toxic encephalopathy: Secondary | ICD-10-CM | POA: Diagnosis not present

## 2018-11-05 DIAGNOSIS — I10 Essential (primary) hypertension: Secondary | ICD-10-CM | POA: Diagnosis not present

## 2018-11-05 DIAGNOSIS — I2510 Atherosclerotic heart disease of native coronary artery without angina pectoris: Secondary | ICD-10-CM | POA: Diagnosis present

## 2018-11-05 DIAGNOSIS — T448X5A Adverse effect of centrally-acting and adrenergic-neuron-blocking agents, initial encounter: Secondary | ICD-10-CM | POA: Diagnosis present

## 2018-11-05 DIAGNOSIS — M1990 Unspecified osteoarthritis, unspecified site: Secondary | ICD-10-CM | POA: Diagnosis not present

## 2018-11-05 DIAGNOSIS — I472 Ventricular tachycardia: Secondary | ICD-10-CM | POA: Diagnosis not present

## 2018-11-05 DIAGNOSIS — N400 Enlarged prostate without lower urinary tract symptoms: Secondary | ICD-10-CM | POA: Diagnosis present

## 2018-11-05 DIAGNOSIS — R000 Tachycardia, unspecified: Secondary | ICD-10-CM | POA: Diagnosis not present

## 2018-11-05 DIAGNOSIS — E785 Hyperlipidemia, unspecified: Secondary | ICD-10-CM | POA: Diagnosis not present

## 2018-11-05 DIAGNOSIS — R0902 Hypoxemia: Secondary | ICD-10-CM | POA: Diagnosis not present

## 2018-11-05 DIAGNOSIS — F339 Major depressive disorder, recurrent, unspecified: Secondary | ICD-10-CM | POA: Diagnosis not present

## 2018-11-05 DIAGNOSIS — M5489 Other dorsalgia: Secondary | ICD-10-CM | POA: Diagnosis not present

## 2018-11-05 LAB — URINALYSIS, COMPLETE (UACMP) WITH MICROSCOPIC
Bacteria, UA: NONE SEEN
Bilirubin Urine: NEGATIVE
Glucose, UA: NEGATIVE mg/dL
Hgb urine dipstick: NEGATIVE
Ketones, ur: NEGATIVE mg/dL
Leukocytes,Ua: NEGATIVE
Nitrite: NEGATIVE
PH: 6 (ref 5.0–8.0)
Protein, ur: NEGATIVE mg/dL
Specific Gravity, Urine: 1.008 (ref 1.005–1.030)

## 2018-11-05 LAB — COMPREHENSIVE METABOLIC PANEL
ALK PHOS: 69 U/L (ref 38–126)
ALT: 19 U/L (ref 0–44)
AST: 31 U/L (ref 15–41)
Albumin: 4 g/dL (ref 3.5–5.0)
Anion gap: 12 (ref 5–15)
BUN: 17 mg/dL (ref 8–23)
CALCIUM: 9.2 mg/dL (ref 8.9–10.3)
CO2: 29 mmol/L (ref 22–32)
Chloride: 94 mmol/L — ABNORMAL LOW (ref 98–111)
Creatinine, Ser: 1.01 mg/dL (ref 0.61–1.24)
GFR calc Af Amer: >60 mL/min (ref >60)
GFR calc non Af Amer: >60 mL/min (ref >60)
Glucose, Bld: 119 mg/dL — ABNORMAL HIGH (ref 70–99)
Potassium: 3.4 mmol/L — ABNORMAL LOW (ref 3.5–5.1)
Sodium: 135 mmol/L (ref 135–145)
Total Bilirubin: 1 mg/dL (ref 0.3–1.2)
Total Protein: 7.2 g/dL (ref 6.5–8.1)

## 2018-11-05 LAB — CBC WITH DIFFERENTIAL/PLATELET
Abs Immature Granulocytes: 0.05 10*3/uL (ref 0.00–0.07)
Basophils Absolute: 0 10*3/uL (ref 0.0–0.1)
Basophils Relative: 0 %
EOS ABS: 0 10*3/uL (ref 0.0–0.5)
Eosinophils Relative: 0 %
HCT: 34.8 % — ABNORMAL LOW (ref 39.0–52.0)
Hemoglobin: 11.3 g/dL — ABNORMAL LOW (ref 13.0–17.0)
Immature Granulocytes: 1 %
Lymphocytes Relative: 11 %
Lymphs Abs: 1 10*3/uL (ref 0.7–4.0)
MCH: 29.4 pg (ref 26.0–34.0)
MCHC: 32.5 g/dL (ref 30.0–36.0)
MCV: 90.6 fL (ref 80.0–100.0)
MONOS PCT: 12 %
Monocytes Absolute: 1 10*3/uL (ref 0.1–1.0)
Neutro Abs: 6.5 10*3/uL (ref 1.7–7.7)
Neutrophils Relative %: 76 %
Platelets: 167 10*3/uL (ref 150–400)
RBC: 3.84 MIL/uL — ABNORMAL LOW (ref 4.22–5.81)
RDW: 16.2 % — ABNORMAL HIGH (ref 11.5–15.5)
WBC: 8.7 10*3/uL (ref 4.0–10.5)
nRBC: 0 % (ref 0.0–0.2)

## 2018-11-05 LAB — INFLUENZA PANEL BY PCR (TYPE A & B)
INFLAPCR: NEGATIVE
Influenza B By PCR: NEGATIVE

## 2018-11-05 LAB — LIPASE, BLOOD: LIPASE: 17 U/L (ref 11–51)

## 2018-11-05 LAB — CBG MONITORING, ED: Glucose-Capillary: 113 mg/dL — ABNORMAL HIGH (ref 70–99)

## 2018-11-05 LAB — AMMONIA: Ammonia: 15 umol/L (ref 9–35)

## 2018-11-05 LAB — LACTIC ACID, PLASMA: Lactic Acid, Venous: 1 mmol/L (ref 0.5–1.9)

## 2018-11-05 MED ORDER — LACTATED RINGERS IV BOLUS
1000.0000 mL | Freq: Once | INTRAVENOUS | Status: AC
  Administered 2018-11-05: 1000 mL via INTRAVENOUS

## 2018-11-05 MED ORDER — HALOPERIDOL LACTATE 5 MG/ML IJ SOLN
2.0000 mg | Freq: Once | INTRAMUSCULAR | Status: AC
  Administered 2018-11-06: 2 mg via INTRAVENOUS
  Filled 2018-11-05: qty 1

## 2018-11-05 NOTE — ED Notes (Signed)
Condom Cath placed on pt. 

## 2018-11-05 NOTE — Progress Notes (Signed)
Left lower extremity venous duplex has been completed. Preliminary results can be found in CV Proc through chart review.  Results were given to Dr. Regenia Skeeter.   11/05/18 7:15 PM William Jordan RVT

## 2018-11-05 NOTE — ED Notes (Signed)
Wife Judeen Hammans number is [phone]. Would like you to call her when and if pt is discharged.

## 2018-11-05 NOTE — ED Notes (Signed)
Pt attempted to void again in the urinal but was unable to go.

## 2018-11-05 NOTE — ED Provider Notes (Signed)
[facility] EMERGENCY DEPARTMENT Provider Note   CSN: [id number] Arrival date & time: 11/05/18  1829  LEVEL 5 CAVEAT - ALTERED MENTAL STATUS   History   Chief Complaint Chief Complaint  Patient presents with  . Altered Mental Status    HPI William Jordan is a 73 y.o. male.     HPI  Patient presents with altered mental status.  History is somewhat limited as the patient is confused and mostly taken from the wife at the bedside.  The patient has been confused over the last 2 days, a little disoriented but also doing odd behaviors.  He has had this multiple times before.  Over the last week he has been having a cough.  Had a temperature of about 101 a couple days ago.  The patient was recently put on a medicine for prostate issues, Alfuzosin, and sometimes this has caused him to be confused in the past.   Past Medical History:  Diagnosis Date  . Angioedema    from Benazepril reaction  . Anxiety    takes Celexa  . Arthritis   . Chronic back pain   . CVA (cerebral vascular accident) ([place])   . Enlarged prostate   . History of blood transfusion   . Hypertension   . Neuropathy     Patient Active Problem List   Diagnosis Date Noted  . Left leg swelling 05/16/2018  . Alcohol abuse 05/16/2018  . Depression 05/16/2018  . Toxic encephalopathy   . Marijuana abuse   . Encephalopathy 05/15/2018  . Gait disturbance, post-stroke   . History of CVA (cerebrovascular accident) 05/04/2018  . Spastic hemiparesis of left nondominant side due to acute cerebral infarction ([place])   . 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[place])   . Essential hypertension   . Hyponatremia   . Non-ST elevation (NSTEMI) myocardial infarction ([place])   . Ventricular tachycardia ([place])   . CVA (cerebral vascular accident) ([place]) 04/25/2018  . Angioedema 04/18/2018  . History of right inguinal hernia repair 02/10/2018  . Acute kidney injury ([place]) 12/10/2017  . Urinary retention 04/25/2016  . Acute renal failure  syndrome ([place])   . Low back pain with sciatica 04/03/2015  . H/O total hip arthroplasty 04/03/2015  . Idiopathic peripheral neuropathy 09/29/2014  . Chronic pain 09/28/2014  . DDD (degenerative disc disease), lumbar 12/30/2012  . Degenerative arthritis of thoracic spine 12/30/2012  . Localized osteoarthrosis 12/13/2012  . Chronic pain associated with significant psychosocial dysfunction 12/13/2012  . Polypharmacy 12/13/2012  . Long term current use of opiate analgesic 12/13/2012    Past Surgical History:  Procedure Laterality Date  . CARDIAC CATHETERIZATION  05/03/2018  . COLONOSCOPY     one polyp removed - benign  . ELBOW SURGERY Right   . HERNIA REPAIR     umbilical  . JOINT REPLACEMENT Left    knee, x 2  . JOINT REPLACEMENT Right    hip  . JOINT REPLACEMENT Left    partial shoulder  . LEFT HEART CATH AND CORONARY ANGIOGRAPHY N/A 05/03/2018   Procedure: LEFT HEART CATH AND CORONARY ANGIOGRAPHY;  Surgeon: Martinique, Peter M, MD;  Location: [facility];  Service: Cardiovascular;  Laterality: N/A;  . LOOP RECORDER INSERTION N/A 04/27/2018   Procedure: LOOP RECORDER INSERTION;  Surgeon: Evans Lance, MD;  Location: [facility];  Service: Cardiovascular;  Laterality: N/A;  . SPINAL CORD STIMULATOR INSERTION N/A 09/28/2014   Procedure: LUMBAR SPINAL CORD STIMULATOR INSERTION;  Surgeon: Melina Schools,  MD;  Location: [facility];  Service: Orthopedics;  Laterality: N/A;  . TEE WITHOUT CARDIOVERSION N/A 04/27/2018   Procedure: TRANSESOPHAGEAL ECHOCARDIOGRAM (TEE);  Surgeon: Larey Dresser, MD;  Location: [facility]COPY;  Service: Cardiovascular;  Laterality: N/A;  . TONSILLECTOMY    . VASECTOMY          Home Medications    Prior to Admission medications   Medication Sig Start Date End Date Taking? Authorizing Provider  acetaminophen (TYLENOL) 325 MG tablet Take 2 tablets (650 mg total) by mouth every 4 (four) hours as needed for headache or mild pain. 05/06/18   Angiulli,  Lavon Paganini, PA-C  alfuzosin (UROXATRAL) 10 MG 24 hr tablet Take 1 tablet (10 mg total) by mouth daily after supper. 05/06/18   Angiulli, Lavon Paganini, PA-C  amLODipine (NORVASC) 10 MG tablet Take 10 mg by mouth daily.    [provider]  aspirin 325 MG tablet Take 1 tablet (325 mg total) by mouth daily. 04/29/18   Asencion Noble, MD  atorvastatin (LIPITOR) 40 MG tablet Take 1 tablet (40 mg total) by mouth daily at 6 PM. 05/06/18   Angiulli, Lavon Paganini, PA-C  cetirizine (ZYRTEC) 10 MG tablet Take 1 tablet (10 mg total) by mouth daily. 05/17/18   Seawell, Jaimie A, DO  cholecalciferol (VITAMIN D) 1000 units tablet Take 2,000 Units by mouth daily.    [provider]  citalopram (CELEXA) 20 MG tablet Take 1 tablet (20 mg total) by mouth daily. 05/18/18   Seawell, Jaimie A, DO  clopidogrel (PLAVIX) 75 MG tablet Take 1 tablet (75 mg total) by mouth daily. 05/06/18   Angiulli, Lavon Paganini, PA-C  colchicine 0.6 MG tablet Take 0.6 mg by mouth 3 (three) times daily as needed (for gout flares).     [provider]  divalproex (DEPAKOTE) 500 MG DR tablet Take 1 tablet (500 mg total) by mouth every 12 (twelve) hours. 05/17/18   Seawell, Jaimie A, DO  gabapentin (NEURONTIN) 600 MG tablet Take 0.5 tablets (300 mg total) by mouth 3 (three) times daily. Patient taking differently: Take 600 mg by mouth 3 (three) times daily.  05/06/18   Angiulli, Lavon Paganini, PA-C  magnesium gluconate (MAGONATE) 500 MG tablet Take 500 mg by mouth 2 (two) times daily.    [provider]  Oxycodone HCl 20 MG TABS Take 1 tablet (20 mg total) by mouth every 8 (eight) hours as needed. Patient taking differently: Take 20 mg by mouth See admin instructions. Take 20 mg by mouth every 4-6 hours as needed for pain 12/12/17   Roxan Hockey, MD  pantoprazole (PROTONIX) 40 MG tablet Take 1 tablet (40 mg total) by mouth daily. 05/07/18   Angiulli, Lavon Paganini, PA-C  ramelteon (ROZEREM) 8 MG tablet Take 1 tablet (8 mg total) by mouth  at bedtime. 05/06/18   Angiulli, Lavon Paganini, PA-C  vitamin B-12 (CYANOCOBALAMIN) 100 MCG tablet Take 1 tablet (100 mcg total) by mouth daily. 08/17/15   Caren Griffins, MD    Family History Family History  Problem Relation Age of Onset  . Heart disease Father     Social History Social History   Tobacco Use  . Smoking status: Current Every Day Smoker    Types: E-cigarettes, Cigars  . Smokeless tobacco: Former Systems developer    Types: Snuff  . Tobacco comment: does not use any tobacco products any more  Substance Use Topics  . Alcohol use: Yes    Comment: occ  . Drug use:  No     Allergies   Ace inhibitors; Morphine; and Sulfa antibiotics   Review of Systems Review of Systems  Unable to perform ROS: Mental status change     Physical Exam Updated Vital Signs BP 140/90   Pulse (!) 106   Temp 99.7 F (37.6 C) (Rectal)   Resp 12   SpO2 96%   Physical Exam Vitals signs and nursing note reviewed.  Constitutional:      Appearance: He is well-developed.  HENT:     Head: Normocephalic and atraumatic.     Right Ear: External ear normal.     Left Ear: External ear normal.     Nose: Nose normal.  Eyes:     General:        Right eye: No discharge.        Left eye: No discharge.  Neck:     Musculoskeletal: Neck supple.  Cardiovascular:     Rate and Rhythm: Regular rhythm. Tachycardia present.     Heart sounds: Normal heart sounds.  Pulmonary:     Effort: Pulmonary effort is normal.     Breath sounds: Normal breath sounds.  Abdominal:     Palpations: Abdomen is soft.     Tenderness: There is abdominal tenderness.  Musculoskeletal:     Left lower leg: Edema present.     Comments: Left lower extremity is diffusely swollen, red, mildly warm  Skin:    General: Skin is warm and dry.  Neurological:     Mental Status: He is alert.     Comments: Alert, oriented to person, place, month, disoriented to year (says 1960) CN 3-12 grossly intact. 5/5 strength in all 4 extremities.  Grossly normal sensation. Normal finger to nose.   Psychiatric:        Mood and Affect: Mood is not anxious.      ED Treatments / Results  Labs (all labs ordered are listed, but only abnormal results are displayed) Labs Reviewed  COMPREHENSIVE METABOLIC PANEL - Abnormal; Notable for the following components:      Result Value   Potassium 3.4 (*)    Chloride 94 (*)    Glucose, Bld 119 (*)    All other components within normal limits  CBC WITH DIFFERENTIAL/PLATELET - Abnormal; Notable for the following components:   RBC 3.84 (*)    Hemoglobin 11.3 (*)    HCT 34.8 (*)    RDW 16.2 (*)    All other components within normal limits  URINALYSIS, COMPLETE (UACMP) WITH MICROSCOPIC - Abnormal; Notable for the following components:   Color, Urine STRAW (*)    All other components within normal limits  CBG MONITORING, ED - Abnormal; Notable for the following components:   Glucose-Capillary 113 (*)    All other components within normal limits  URINE CULTURE  LACTIC ACID, PLASMA  AMMONIA  LIPASE, BLOOD  INFLUENZA PANEL BY PCR (TYPE A & B)  VALPROIC ACID LEVEL    EKG EKG Interpretation  Date/Time:  Friday November 05 2018 18:42:14 EST Ventricular Rate:  107 PR Interval:    QRS Duration: 93 QT Interval:  346 QTC Calculation: 462 R Axis:   90 Text Interpretation:  Sinus tachycardia Borderline right axis deviation Interpretation limited secondary to artifact otherwise, rate is faster but similar to Oct 2019 Confirmed by Sherwood Gambler [id number]) on 11/05/2018 6:51:44 PM   Radiology Dg Chest 2 View  Result Date: 11/05/2018 CLINICAL DATA:  Cough EXAM: CHEST - 2 VIEW COMPARISON:  05/15/2018 FINDINGS:  Low lung volumes. Electronic recording device over the left chest. Ascending thoracic stimulator leads. Multiple chronic compression deformities of the thoracolumbar spine. Left shoulder replacement. Subsegmental atelectasis at the bases. Stable cardiomediastinal silhouette. Aortic  atherosclerosis. No pneumothorax. IMPRESSION: Low lung volumes with subsegmental atelectasis at the bases Electronically Signed   By: Donavan Foil M.D.   On: 11/05/2018 20:11   Vas [place] Lower Extremity Venous (dvt) (mc And Wl 7a-7p)  Result Date: 11/05/2018  Lower Venous Study Indications: Swelling.  Performing Technologist: Oliver Hum RVT  Examination Guidelines: A complete evaluation includes B-mode imaging, spectral Doppler, color Doppler, and power Doppler as needed of all accessible portions of each vessel. Bilateral testing is considered an integral part of a complete examination. Limited examinations for reoccurring indications may be performed as noted.  Right Venous Findings: +---+---------------+---------+-----------+----------+-------+    CompressibilityPhasicitySpontaneityPropertiesSummary +---+---------------+---------+-----------+----------+-------+ CFVFull           Yes      Yes                          +---+---------------+---------+-----------+----------+-------+  Left Venous Findings: +---------+---------------+---------+-----------+----------+-------+          CompressibilityPhasicitySpontaneityPropertiesSummary +---------+---------------+---------+-----------+----------+-------+ CFV      Full           Yes      Yes                          +---------+---------------+---------+-----------+----------+-------+ SFJ      Full                                                 +---------+---------------+---------+-----------+----------+-------+ FV Prox  Full                                                 +---------+---------------+---------+-----------+----------+-------+ FV Mid   Full                                                 +---------+---------------+---------+-----------+----------+-------+ FV DistalFull                                                 +---------+---------------+---------+-----------+----------+-------+ PFV       Full                                                 +---------+---------------+---------+-----------+----------+-------+ POP      Full           Yes      Yes                          +---------+---------------+---------+-----------+----------+-------+ PTV      Full                                                 +---------+---------------+---------+-----------+----------+-------+  PERO     Full                                                 +---------+---------------+---------+-----------+----------+-------+    Summary: Right: No evidence of common femoral vein obstruction. Left: There is no evidence of deep vein thrombosis in the lower extremity. No cystic structure found in the popliteal fossa.  *See table(s) above for measurements and observations. Electronically signed by Deitra Mayo MD on 11/05/2018 at 7:50:55 PM.    Final     Procedures Procedures (including critical care time)  Medications Ordered in ED Medications  lactated ringers bolus 1,000 mL (0 mLs Intravenous Stopped 11/05/18 2123)  haloperidol lactate (HALDOL) injection 2 mg (2 mg Intravenous Given 11/06/18 0001)  LORazepam (ATIVAN) injection 1 mg (1 mg Intravenous Given 11/06/18 0103)     Initial Impression / Assessment and Plan / ED Course  I have reviewed the triage vital signs and the nursing notes.  Pertinent labs & imaging results that were available during my care of the patient were reviewed by me and considered in my medical decision making (see chart for details).        Patient has become progressively more altered in the ED.  He started to urinate on the floor and now is becoming agitated towards staff.  He will be given small dose of Haldol.  At this point there is no clear etiology for his delirium though he has had this many times in the past.  He does not appear stable enough to go home in the care of his wife and will need admission.  Currently his head CT is pending.  Care  transferred to Dr. Leonette Monarch.  Final Clinical Impressions(s) / ED Diagnoses   Final diagnoses:  None    ED Discharge Orders    None       Sherwood Gambler, MD 11/06/18 [phone]

## 2018-11-05 NOTE — ED Triage Notes (Signed)
Pt to ED via [facility] with c/o altered mental status.  Pt's wife reported to EMS that she has noticed pt has had frequent urination with small amounts.   Pt has recently been treated for gout.

## 2018-11-05 NOTE — ED Triage Notes (Signed)
Pt brought in by EMS for c/o AMS that began this morning; pt usually alert and oriented x 4 but today is only alert and oriented x 2 ; per wife , patient has had a decreased appetite and decreased urinary output as well; pt c/o frequency and hesitancy ;  Pt alos c/o productive cough with yellow phlegm; pt currently being treated for gout to the left foot ;  Pt following simple commands at this time

## 2018-11-05 NOTE — ED Notes (Signed)
Pt got out of bed and urinated large amount on the floor.  Pt was dried and placed back on stretcher.  Pt then voided on stretcher.   Dr. Regenia Skeeter made aware

## 2018-11-06 ENCOUNTER — Emergency Department (HOSPITAL_COMMUNITY): Payer: PPO

## 2018-11-06 DIAGNOSIS — G3184 Mild cognitive impairment, so stated: Secondary | ICD-10-CM

## 2018-11-06 DIAGNOSIS — F17290 Nicotine dependence, other tobacco product, uncomplicated: Secondary | ICD-10-CM | POA: Diagnosis present

## 2018-11-06 DIAGNOSIS — M549 Dorsalgia, unspecified: Secondary | ICD-10-CM

## 2018-11-06 DIAGNOSIS — F1010 Alcohol abuse, uncomplicated: Secondary | ICD-10-CM

## 2018-11-06 DIAGNOSIS — F329 Major depressive disorder, single episode, unspecified: Secondary | ICD-10-CM | POA: Diagnosis present

## 2018-11-06 DIAGNOSIS — G92 Toxic encephalopathy: Secondary | ICD-10-CM | POA: Diagnosis present

## 2018-11-06 DIAGNOSIS — K219 Gastro-esophageal reflux disease without esophagitis: Secondary | ICD-10-CM

## 2018-11-06 DIAGNOSIS — Z7902 Long term (current) use of antithrombotics/antiplatelets: Secondary | ICD-10-CM | POA: Diagnosis not present

## 2018-11-06 DIAGNOSIS — I10 Essential (primary) hypertension: Secondary | ICD-10-CM

## 2018-11-06 DIAGNOSIS — F339 Major depressive disorder, recurrent, unspecified: Secondary | ICD-10-CM

## 2018-11-06 DIAGNOSIS — Z8673 Personal history of transient ischemic attack (TIA), and cerebral infarction without residual deficits: Secondary | ICD-10-CM | POA: Diagnosis not present

## 2018-11-06 DIAGNOSIS — E785 Hyperlipidemia, unspecified: Secondary | ICD-10-CM | POA: Diagnosis present

## 2018-11-06 DIAGNOSIS — G8929 Other chronic pain: Secondary | ICD-10-CM | POA: Diagnosis present

## 2018-11-06 DIAGNOSIS — T448X5A Adverse effect of centrally-acting and adrenergic-neuron-blocking agents, initial encounter: Secondary | ICD-10-CM | POA: Diagnosis present

## 2018-11-06 DIAGNOSIS — G40909 Epilepsy, unspecified, not intractable, without status epilepticus: Secondary | ICD-10-CM

## 2018-11-06 DIAGNOSIS — F1210 Cannabis abuse, uncomplicated: Secondary | ICD-10-CM | POA: Diagnosis present

## 2018-11-06 DIAGNOSIS — F419 Anxiety disorder, unspecified: Secondary | ICD-10-CM | POA: Diagnosis present

## 2018-11-06 DIAGNOSIS — Z888 Allergy status to other drugs, medicaments and biological substances status: Secondary | ICD-10-CM

## 2018-11-06 DIAGNOSIS — Z885 Allergy status to narcotic agent status: Secondary | ICD-10-CM

## 2018-11-06 DIAGNOSIS — I2510 Atherosclerotic heart disease of native coronary artery without angina pectoris: Secondary | ICD-10-CM

## 2018-11-06 DIAGNOSIS — Z87891 Personal history of nicotine dependence: Secondary | ICD-10-CM

## 2018-11-06 DIAGNOSIS — I252 Old myocardial infarction: Secondary | ICD-10-CM

## 2018-11-06 DIAGNOSIS — Z882 Allergy status to sulfonamides status: Secondary | ICD-10-CM | POA: Diagnosis not present

## 2018-11-06 DIAGNOSIS — R4182 Altered mental status, unspecified: Secondary | ICD-10-CM | POA: Diagnosis present

## 2018-11-06 DIAGNOSIS — Z9682 Presence of neurostimulator: Secondary | ICD-10-CM

## 2018-11-06 DIAGNOSIS — Z9861 Coronary angioplasty status: Secondary | ICD-10-CM

## 2018-11-06 DIAGNOSIS — G629 Polyneuropathy, unspecified: Secondary | ICD-10-CM | POA: Diagnosis present

## 2018-11-06 DIAGNOSIS — Z79899 Other long term (current) drug therapy: Secondary | ICD-10-CM

## 2018-11-06 DIAGNOSIS — G9340 Encephalopathy, unspecified: Secondary | ICD-10-CM | POA: Diagnosis present

## 2018-11-06 DIAGNOSIS — N400 Enlarged prostate without lower urinary tract symptoms: Secondary | ICD-10-CM | POA: Diagnosis present

## 2018-11-06 DIAGNOSIS — Z8669 Personal history of other diseases of the nervous system and sense organs: Secondary | ICD-10-CM

## 2018-11-06 DIAGNOSIS — M1990 Unspecified osteoarthritis, unspecified site: Secondary | ICD-10-CM | POA: Diagnosis present

## 2018-11-06 DIAGNOSIS — I472 Ventricular tachycardia: Secondary | ICD-10-CM | POA: Diagnosis present

## 2018-11-06 DIAGNOSIS — Z96612 Presence of left artificial shoulder joint: Secondary | ICD-10-CM | POA: Diagnosis present

## 2018-11-06 DIAGNOSIS — Z881 Allergy status to other antibiotic agents status: Secondary | ICD-10-CM

## 2018-11-06 DIAGNOSIS — Z79891 Long term (current) use of opiate analgesic: Secondary | ICD-10-CM

## 2018-11-06 LAB — VITAMIN B12: Vitamin B-12: 666 pg/mL (ref 180–914)

## 2018-11-06 LAB — RAPID URINE DRUG SCREEN, HOSP PERFORMED
Amphetamines: NOT DETECTED
BARBITURATES: NOT DETECTED
Benzodiazepines: POSITIVE — AB
Cocaine: NOT DETECTED
Opiates: POSITIVE — AB
Tetrahydrocannabinol: NOT DETECTED

## 2018-11-06 LAB — CBC
HCT: 34.4 % — ABNORMAL LOW (ref 39.0–52.0)
Hemoglobin: 11.4 g/dL — ABNORMAL LOW (ref 13.0–17.0)
MCH: 30 pg (ref 26.0–34.0)
MCHC: 33.1 g/dL (ref 30.0–36.0)
MCV: 90.5 fL (ref 80.0–100.0)
Platelets: 159 K/uL (ref 150–400)
RBC: 3.8 MIL/uL — ABNORMAL LOW (ref 4.22–5.81)
RDW: 15.9 % — ABNORMAL HIGH (ref 11.5–15.5)
WBC: 10.1 K/uL (ref 4.0–10.5)
nRBC: 0 % (ref 0.0–0.2)

## 2018-11-06 LAB — VALPROIC ACID LEVEL: Valproic Acid Lvl: 38 ug/mL — ABNORMAL LOW (ref 50.0–100.0)

## 2018-11-06 LAB — CREATININE, SERUM
Creatinine, Ser: 0.84 mg/dL (ref 0.61–1.24)
GFR calc Af Amer: >60 mL/min (ref >60)
GFR calc non Af Amer: >60 mL/min (ref >60)

## 2018-11-06 LAB — TSH: TSH: 1.805 u[IU]/mL (ref 0.350–4.500)

## 2018-11-06 MED ORDER — SODIUM CHLORIDE 0.9% FLUSH: 3.0000 mL | INTRAVENOUS | Status: DC | PRN

## 2018-11-06 MED ORDER — VITAMIN B-12 100 MCG PO TABS
100.0000 ug | ORAL_TABLET | Freq: Every day | ORAL | Status: DC
  Administered 2018-11-06 – 2018-11-07 (×2): 100 ug via ORAL
  Filled 2018-11-06 (×2): qty 1

## 2018-11-06 MED ORDER — ENOXAPARIN SODIUM 40 MG/0.4ML ~~LOC~~ SOLN
40.0000 mg | SUBCUTANEOUS | Status: DC
  Administered 2018-11-06: 40 mg via SUBCUTANEOUS
  Filled 2018-11-06: qty 0.4

## 2018-11-06 MED ORDER — MAGNESIUM GLUCONATE 500 MG PO TABS
500.0000 mg | ORAL_TABLET | Freq: Two times a day (BID) | ORAL | Status: DC
  Administered 2018-11-06 – 2018-11-07 (×3): 500 mg via ORAL
  Filled 2018-11-06 (×3): qty 1

## 2018-11-06 MED ORDER — HYDROMORPHONE HCL 1 MG/ML IJ SOLN
1.0000 mg | Freq: Once | INTRAMUSCULAR | Status: AC
  Administered 2018-11-06: 1 mg via INTRAVENOUS
  Filled 2018-11-06: qty 1

## 2018-11-06 MED ORDER — LORAZEPAM 2 MG/ML IJ SOLN
1.0000 mg | Freq: Once | INTRAMUSCULAR | Status: AC
  Administered 2018-11-06: 1 mg via INTRAVENOUS
  Filled 2018-11-06: qty 1

## 2018-11-06 MED ORDER — AMLODIPINE BESYLATE 10 MG PO TABS
10.0000 mg | ORAL_TABLET | Freq: Every day | ORAL | Status: DC
  Administered 2018-11-06 – 2018-11-07 (×2): 10 mg via ORAL
  Filled 2018-11-06 (×2): qty 1

## 2018-11-06 MED ORDER — DIVALPROEX SODIUM 250 MG PO DR TAB
500.0000 mg | DELAYED_RELEASE_TABLET | Freq: Two times a day (BID) | ORAL | Status: DC
  Administered 2018-11-06 – 2018-11-07 (×3): 500 mg via ORAL
  Filled 2018-11-06 (×3): qty 2

## 2018-11-06 MED ORDER — SODIUM CHLORIDE 0.9% FLUSH
3.0000 mL | Freq: Two times a day (BID) | INTRAVENOUS | Status: DC
  Administered 2018-11-06: 3 mL via INTRAVENOUS

## 2018-11-06 MED ORDER — KETOROLAC TROMETHAMINE 15 MG/ML IJ SOLN
15.0000 mg | Freq: Once | INTRAMUSCULAR | Status: AC
  Administered 2018-11-06: 15 mg via INTRAVENOUS
  Filled 2018-11-06: qty 1

## 2018-11-06 MED ORDER — SODIUM CHLORIDE 0.9% FLUSH
3.0000 mL | Freq: Two times a day (BID) | INTRAVENOUS | Status: DC
  Administered 2018-11-06 – 2018-11-07 (×2): 3 mL via INTRAVENOUS

## 2018-11-06 MED ORDER — CITALOPRAM HYDROBROMIDE 20 MG PO TABS
20.0000 mg | ORAL_TABLET | Freq: Every day | ORAL | Status: DC
  Administered 2018-11-06 – 2018-11-07 (×2): 20 mg via ORAL
  Filled 2018-11-06 (×2): qty 1
  Filled 2018-11-06 (×2): qty 2

## 2018-11-06 MED ORDER — LORAZEPAM 2 MG/ML IJ SOLN
0.5000 mg | Freq: Once | INTRAMUSCULAR | Status: AC
  Administered 2018-11-06: 0.5 mg via INTRAVENOUS
  Filled 2018-11-06: qty 1

## 2018-11-06 MED ORDER — ACETAMINOPHEN 325 MG PO TABS
650.0000 mg | ORAL_TABLET | Freq: Four times a day (QID) | ORAL | Status: DC | PRN
  Administered 2018-11-06 – 2018-11-07 (×2): 650 mg via ORAL
  Filled 2018-11-06 (×2): qty 2

## 2018-11-06 MED ORDER — ASPIRIN 325 MG PO TABS
325.0000 mg | ORAL_TABLET | Freq: Every day | ORAL | Status: DC
  Administered 2018-11-06 – 2018-11-07 (×2): 325 mg via ORAL
  Filled 2018-11-06 (×2): qty 1

## 2018-11-06 MED ORDER — RAMELTEON 8 MG PO TABS
8.0000 mg | ORAL_TABLET | Freq: Every day | ORAL | Status: DC
  Administered 2018-11-06: 8 mg via ORAL
  Filled 2018-11-06: qty 1

## 2018-11-06 MED ORDER — PANTOPRAZOLE SODIUM 40 MG PO TBEC
40.0000 mg | DELAYED_RELEASE_TABLET | Freq: Every day | ORAL | Status: DC
  Administered 2018-11-06 – 2018-11-07 (×2): 40 mg via ORAL
  Filled 2018-11-06 (×2): qty 1

## 2018-11-06 MED ORDER — VITAMIN D 25 MCG (1000 UNIT) PO TABS
2000.0000 [IU] | ORAL_TABLET | Freq: Every day | ORAL | Status: DC
  Administered 2018-11-06 – 2018-11-07 (×2): 2000 [IU] via ORAL
  Filled 2018-11-06 (×2): qty 2

## 2018-11-06 MED ORDER — CLOPIDOGREL BISULFATE 75 MG PO TABS
75.0000 mg | ORAL_TABLET | Freq: Every day | ORAL | Status: DC
  Administered 2018-11-06 – 2018-11-07 (×2): 75 mg via ORAL
  Filled 2018-11-06 (×2): qty 1

## 2018-11-06 MED ORDER — SENNOSIDES-DOCUSATE SODIUM 8.6-50 MG PO TABS: 1.0000 | ORAL_TABLET | Freq: Every evening | ORAL | Status: DC | PRN

## 2018-11-06 MED ORDER — ATORVASTATIN CALCIUM 40 MG PO TABS
40.0000 mg | ORAL_TABLET | Freq: Every day | ORAL | Status: DC
  Administered 2018-11-06: 40 mg via ORAL
  Filled 2018-11-06: qty 1

## 2018-11-06 MED ORDER — ACETAMINOPHEN 650 MG RE SUPP: 650.0000 mg | Freq: Four times a day (QID) | RECTAL | Status: DC | PRN

## 2018-11-06 NOTE — ED Notes (Signed)
Admitting Dr bedside 

## 2018-11-06 NOTE — ED Notes (Signed)
  Patient has repeatedly pulled off his cardiac monitor and pulse ox.  Patient has been placed back on monitor several times but still continues to pull off leads and pulse ox.

## 2018-11-06 NOTE — ED Notes (Signed)
  Patient attempting to climb out of stretcher.  Patient is able to be redirected but will still attempt to climb out.

## 2018-11-06 NOTE — ED Provider Notes (Signed)
Patient remained agitated making obtain a CT scan difficult.  He required additional medication for sedation.  We were finally able to obtain a CT which did not reveal any ICH or other significant changes.  Case was discussed with internal medicine who admitted the patient for continued work-up and management of altered mental status.   Fatima Blank, MD 11/06/18 [phone]

## 2018-11-06 NOTE — Progress Notes (Signed)
P.t. has arrived on 3w. P.t. is disoriented and is A&O x1. P.t. refuses to keep continuous pulse ox on finger. P.t. refuses to wear a gown.

## 2018-11-06 NOTE — H&P (Signed)
Date: 11/06/2018               Patient Name:  William Jordan MRN: [id number]  DOB: Mar 21, 1946 Age / Sex: 73 y.o., male   PCP: Townsend Roger, MD         Medical Service: Internal Medicine Teaching Service         Attending Physician: Dr. Oval Linsey, MD    First Contact: Dr. Donne Hazel Pager: [phone]  Second Contact: Dr. Trilby Drummer Pager: [phone]       After Hours (After 5p/  First Contact Pager: [phone]  weekends / holidays): Second Contact Pager: [phone]   Chief Complaint: AMS  History of Present Illness: 29yoM with a h/o multiple admissions for acute encephalopathy since 2016 of various causes, previous PCA CVA in 9678 complicated by seizures, previous NSTEMI with non-obstructive CAD on cath, alcohol abuse, THC abuse, chronic back pain s/p spinal stimulator or chronic oxycodone, depression, anxiety who presents with 2 days of confusion.   On my evaluation, Mr. Boruff level of consciousness is waxing and waning. He thinks that we are holding him in [place] and the year is 2022. He does not know why he is here. Over the last 8 hours, he has become more agitated in the ED - getting out of bed, urinating on the floor and in the bed, and pulling of telemetry leads making it difficult to obtain a CT scan. He has received a total of 2mg  haldol, 2mg  dilaudid, and 3mg  Ativan. He is presently unaccompanied.   Per chart review, he has been a little disoriented and displaying odd behaviors over the last 2 days. This has happened multiple times in the past. He has had a cough and one temperature of 101F a couple days ago. He was recently started on a medicine, Alfuzosin, for prostate issues which has caused him to become confused in the past.    Meds: Unable to reconcile meds due to patients AMS Current Meds  Medication Sig  . acetaminophen (TYLENOL) 325 MG tablet Take 2 tablets (650 mg total) by mouth every 4 (four) hours as needed for headache or mild pain.  Marland Kitchen amLODipine (NORVASC) 10 MG tablet  Take 10 mg by mouth daily.  Marland Kitchen aspirin 325 MG tablet Take 1 tablet (325 mg total) by mouth daily.  Marland Kitchen atorvastatin (LIPITOR) 40 MG tablet Take 1 tablet (40 mg total) by mouth daily at 6 PM.  . cetirizine (ZYRTEC) 10 MG tablet Take 1 tablet (10 mg total) by mouth daily.  . cholecalciferol (VITAMIN D) 1000 units tablet Take 2,000 Units by mouth daily.  . citalopram (CELEXA) 20 MG tablet Take 1 tablet (20 mg total) by mouth daily.  . clopidogrel (PLAVIX) 75 MG tablet Take 1 tablet (75 mg total) by mouth daily.  . colchicine 0.6 MG tablet Take 0.6 mg by mouth 3 (three) times daily as needed (for gout flares).   . divalproex (DEPAKOTE) 500 MG DR tablet Take 1 tablet (500 mg total) by mouth every 12 (twelve) hours.  . gabapentin (NEURONTIN) 600 MG tablet Take 0.5 tablets (300 mg total) by mouth 3 (three) times daily. (Patient taking differently: Take 600 mg by mouth 3 (three) times daily. )  . magnesium gluconate (MAGONATE) 500 MG tablet Take 500 mg by mouth 2 (two) times daily.  Marland Kitchen omeprazole (PRILOSEC) 20 MG capsule Take 20 mg by mouth daily.  . Oxycodone HCl 20 MG TABS Take 1 tablet (20 mg total) by mouth every 8 (eight)  hours as needed. (Patient taking differently: Take 20 mg by mouth See admin instructions. Take 20 mg by mouth every 4-6 hours as needed for pain)  . vitamin B-12 (CYANOCOBALAMIN) 100 MCG tablet Take 1 tablet (100 mcg total) by mouth daily.  Marland Kitchen zolpidem (AMBIEN) 5 MG tablet Take 5 mg by mouth at bedtime as needed for sleep.   Current Outpatient Medications on File Prior to Encounter  Medication Sig Dispense Refill  . acetaminophen (TYLENOL) 325 MG tablet Take 2 tablets (650 mg total) by mouth every 4 (four) hours as needed for headache or mild pain.    Marland Kitchen alfuzosin (UROXATRAL) 10 MG 24 hr tablet Take 1 tablet (10 mg total) by mouth daily after supper. 30 tablet 0  . amLODipine (NORVASC) 10 MG tablet Take 10 mg by mouth daily.    Marland Kitchen aspirin 325 MG tablet Take 1 tablet (325 mg total) by  mouth daily. 30 tablet 3  . atorvastatin (LIPITOR) 40 MG tablet Take 1 tablet (40 mg total) by mouth daily at 6 PM. 30 tablet 3  . cetirizine (ZYRTEC) 10 MG tablet Take 1 tablet (10 mg total) by mouth daily.    . cholecalciferol (VITAMIN D) 1000 units tablet Take 2,000 Units by mouth daily.    . citalopram (CELEXA) 20 MG tablet Take 1 tablet (20 mg total) by mouth daily. 30 tablet 5  . clopidogrel (PLAVIX) 75 MG tablet Take 1 tablet (75 mg total) by mouth daily. 30 tablet 3  . colchicine 0.6 MG tablet Take 0.6 mg by mouth 3 (three) times daily as needed (for gout flares).     . divalproex (DEPAKOTE) 500 MG DR tablet Take 1 tablet (500 mg total) by mouth every 12 (twelve) hours. 30 tablet 5  . gabapentin (NEURONTIN) 600 MG tablet Take 0.5 tablets (300 mg total) by mouth 3 (three) times daily. (Patient taking differently: Take 600 mg by mouth 3 (three) times daily. ) 90 tablet 0  . magnesium gluconate (MAGONATE) 500 MG tablet Take 500 mg by mouth 2 (two) times daily.    . Oxycodone HCl 20 MG TABS Take 1 tablet (20 mg total) by mouth every 8 (eight) hours as needed. (Patient taking differently: Take 20 mg by mouth See admin instructions. Take 20 mg by mouth every 4-6 hours as needed for pain) 1 tablet 0  . pantoprazole (PROTONIX) 40 MG tablet Take 1 tablet (40 mg total) by mouth daily. 30 tablet 0  . ramelteon (ROZEREM) 8 MG tablet Take 1 tablet (8 mg total) by mouth at bedtime. 30 tablet 0  . vitamin B-12 (CYANOCOBALAMIN) 100 MCG tablet Take 1 tablet (100 mcg total) by mouth daily. 30 tablet 1     Allergies: Allergies as of 11/05/2018 - Review Complete 11/05/2018  Allergen Reaction Noted  . Ace inhibitors Shortness Of Breath, Swelling, and Other (See Comments) 04/18/2018  . Morphine Other (See Comments) 06/23/2017  . Sulfa antibiotics Other (See Comments) 04/25/2018   Past Medical History:  Diagnosis Date  . Angioedema    from Benazepril reaction  . Anxiety    takes Celexa  . Arthritis     . Chronic back pain   . CVA (cerebral vascular accident) ([place])   . Enlarged prostate   . History of blood transfusion   . Hypertension   . Neuropathy     Family History: Unable to confirm due to patient's AMS Family History  Problem Relation Age of Onset  . Heart disease Father  Social History: Unable to confirm due to patient's AMS Social History   Socioeconomic History  . Marital status: Married    Spouse name: Not on file  . Number of children: Not on file  . Years of education: Not on file  . Highest education level: Not on file  Occupational History  . Not on file  Social Needs  . Financial resource strain: Not on file  . Food insecurity:    Worry: Not on file    Inability: Not on file  . Transportation needs:    Medical: Not on file    Non-medical: Not on file  Tobacco Use  . Smoking status: Current Every Day Smoker    Types: E-cigarettes, Cigars  . Smokeless tobacco: Former Systems developer    Types: Snuff  . Tobacco comment: does not use any tobacco products any more  Substance and Sexual Activity  . Alcohol use: Yes    Comment: occ  . Drug use: No  . Sexual activity: Never  Lifestyle  . Physical activity:    Days per week: Not on file    Minutes per session: Not on file  . Stress: Not on file  Relationships  . Social connections:    Talks on phone: Not on file    Gets together: Not on file    Attends religious service: Not on file    Active member of club or organization: Not on file    Attends meetings of clubs or organizations: Not on file    Relationship status: Not on file  . Intimate partner violence:    Fear of current or ex partner: Not on file    Emotionally abused: Not on file    Physically abused: Not on file    Forced sexual activity: Not on file  Other Topics Concern  . Not on file  Social History Narrative  . Not on file     Review of Systems: Unable to complete due to patient's AMS  Physical Exam: Blood pressure (!) 158/86,  pulse (!) 103, temperature 98.2 F (36.8 C), temperature source Oral, resp. rate 20, SpO2 97 %.  Gen: caucasian male, appears stated age, NAD, somnolent with waxing and waning level of consciousness, easily arousable, pleasant  HENT: atraumatic, PERRL, normal conjunctiva, no eye discharge, dry MM, EOM grossly intact Cardiac: RRR, no m/r/g, no JVD Pulm: anterior lung fields are CTAB, unable to sit up due to patient's decreased level of consciousness, normal wob, no respiratory distress Abd: +BS, initially TTP in LUQ but was not consistent with repeat exam, soft, ND Ext: left knee with well healed scar, left lower extremity is erythematous and mild swelling compared to the right leg. Non-tender. DP pulses strong and symmetric  Neuro: limited due to patient's participation. PERRL, EOM grossly intact, no facial droop, unable to reliably test sensation, upper extremity strength 5/5 bilaterally, unable to test lower extremity strength but he does move both legs voluntarily. Does not follow commands on cerebellar testing    Labs: CBG 113  WBC 8.7 Hgb 11.3, MCV 90  Na 135 K 3.4 Creatine 1.01 Glucose 119 LFTs normal   Valproate 38 (low) Flu A & B neg  UA: no nitrites, no leukocytes, no bacteria, spec grav 1.00   EKG: personally reviewed my interpretation is sinus tach HR 107, interpretation limited due to artifact from spinal stimulator interference   CXR: personally reviewed my interpretation is hypoinflated lungs, left shoulder replacement, thoracic spinal stimulator, bilateral atelectasis, no focal consolidation, effusion, or  pneumothorax. Mild cardiomegaly.   CT Head: IMPRESSION: 1. No acute intracranial hemorrhage. 2. Mild age-related atrophy and chronic microvascular ischemic changes. Areas of old infarct and encephalomalacia involving the right parietal convexity and right occipital lobe.  Vas [place] Lower Extremities: Right: No evidence of common femoral vein obstruction. Left:  There is no evidence of deep vein thrombosis in the lower extremity. No cystic structure found in the popliteal fossa.  Assessment & Plan by Problem: Active Problems:   Acute encephalopathy  7yoM with a h/o multiple admissions for acute encephalopathy since 2016 of various causes, previous PCA CVA in 5003 complicated by seizures, previous NSTEMI with non-obstructive CAD on cath, alcohol abuse, THC abuse, chronic back pain s/p spinal stimulator or chronic oxycodone, depression, anxiety who presents with 2 days of confusion.   Acute Encephalopathy: Differential is broad and history is limited. Will need to speak with wife. Reported history of one time fever a couple days ago and one week of cough but initial work up does not show any signs of infection (afebrile, no leukocytosis, clean UA, no focal pneumonia on CXR). Previous RPR negative. No significant electrolyte abnormalities. Toxins are a possibility as he does have a history of alcohol, THC, and opiate abuse. Was also started on new medication recently. Could also be due to underlying depression, unclear if he is taking his antidepressant. Acute stroke is unlikely as symptoms have been ongoing for 2 days and head CT was negative. Unable to get MRI 2/2 spinal stimulator. Wernicke's encephaloapthy has been discussed on previous admissions but my history is too limited to determine whether or not this could be the cause. We can start him on vitamin replacement while waiting to get more history.  - add on UDS, TSH, B12 - will need to do med rec with wife - continue home citalopram - hold home oxycodone, alfuzosin, and gabapentin  - hold off on additional ativan   Seizure Disorder: subtherapeutic on depakote - seizure precautions - continue depakote   HTN: continue home amlodipine HLD: continue home atorvastatin  GERD: continue home pantoprazole   DVT ppx: lovenox Code: Full, based on previous admissions FEN/GI: bedside swallow  Dispo:  Admit patient to Inpatient with expected length of stay greater than 2 midnights.  Signed: Isabelle Course, MD 11/06/2018, 12:12 PM  Pager: [phone]

## 2018-11-06 NOTE — ED Notes (Signed)
Pt placed on cardiac monitor,   Hughes Better NT sitting with pt.

## 2018-11-06 NOTE — ED Notes (Signed)
Arrived to unit, Pt pulled of leads, continuously trying to get out of bed. Dr made aware

## 2018-11-06 NOTE — ED Notes (Signed)
Pt continues to try to get out of bed.  Dr. Regenia Skeeter made aware, order obtained

## 2018-11-06 NOTE — ED Notes (Signed)
Rn placed Pt on cardiac monitor. Pt continues to pull off leads and remove medical equipment

## 2018-11-07 ENCOUNTER — Other Ambulatory Visit: Payer: Self-pay

## 2018-11-07 DIAGNOSIS — F1211 Cannabis abuse, in remission: Secondary | ICD-10-CM

## 2018-11-07 DIAGNOSIS — G3184 Mild cognitive impairment, so stated: Secondary | ICD-10-CM

## 2018-11-07 DIAGNOSIS — F1011 Alcohol abuse, in remission: Secondary | ICD-10-CM

## 2018-11-07 LAB — CBC
HCT: 35.4 % — ABNORMAL LOW (ref 39.0–52.0)
Hemoglobin: 11.5 g/dL — ABNORMAL LOW (ref 13.0–17.0)
MCH: 29.3 pg (ref 26.0–34.0)
MCHC: 32.5 g/dL (ref 30.0–36.0)
MCV: 90.1 fL (ref 80.0–100.0)
PLATELETS: 166 10*3/uL (ref 150–400)
RBC: 3.93 MIL/uL — ABNORMAL LOW (ref 4.22–5.81)
RDW: 15.8 % — ABNORMAL HIGH (ref 11.5–15.5)
WBC: 7.7 10*3/uL (ref 4.0–10.5)
nRBC: 0 % (ref 0.0–0.2)

## 2018-11-07 LAB — BASIC METABOLIC PANEL
Anion gap: 15 (ref 5–15)
BUN: 17 mg/dL (ref 8–23)
CALCIUM: 9 mg/dL (ref 8.9–10.3)
CO2: 26 mmol/L (ref 22–32)
CREATININE: 0.95 mg/dL (ref 0.61–1.24)
Chloride: 96 mmol/L — ABNORMAL LOW (ref 98–111)
GFR calc non Af Amer: >60 mL/min (ref >60)
Glucose, Bld: 90 mg/dL (ref 70–99)
Potassium: 3.2 mmol/L — ABNORMAL LOW (ref 3.5–5.1)
Sodium: 137 mmol/L (ref 135–145)

## 2018-11-07 LAB — URINE CULTURE: Culture: <10000 — AB

## 2018-11-07 MED ORDER — OXYCODONE HCL 5 MG PO TABS: 20.0000 mg | ORAL_TABLET | Freq: Three times a day (TID) | ORAL | Status: DC | PRN

## 2018-11-07 NOTE — Progress Notes (Signed)
P.t. ambulated in the room and walked 10 yrds in the hallway. P.t. stated we will walk again when wife gets here to take him home.

## 2018-11-07 NOTE — Progress Notes (Signed)
   Subjective: Back to baseline this morning. Feeling great. Alert and oriented x 4. Remembers feeling confused and knows that his wife brought him to the hospital because of it. Doesn't know what the trigger was. I spoke with his wife over the phone this morning. He denies taking more medication than usual or getting his medications mixed up. He does take oxycodone more frequently than prescribed but his wife said he hasn't been taking it much the last couple days. He also takes gabapentin but doesn't know why he still takes it because it doesn't help his pain. He was started on a medicine for BPH and the wife said his confusion started a few days after he started taking that medication. He does not drink alcohol or use any other drugs. His wife visited with him last night and felt he was back to baseline at that time. Requesting to get out of bed and walk around.   Objective:  Vital signs in last 24 hours: Vitals:   11/06/18 2325 11/07/18 0333 11/07/18 0418 11/07/18 0809  BP: 132/88 111/63 111/63 (!) 152/82  Pulse: 80 75 75 83  Resp: 20 18 18 20   Temp: 98.1 F (36.7 C) 98.2 F (36.8 C) 98.2 F (36.8 C) 98.1 F (36.7 C)  TempSrc: Oral Oral Oral Oral  SpO2: 92% 96%  95%  Height:   5\' 10"  (1.778 m)    Gen: elderly, well nourished male, laying in bed, A&O x 4, appropriate conversation Neuro: 5/5 strength in upper and lower extremities with intact sensation   Assessment/Plan:  Active Problems:   Acute encephalopathy   73yoM with a h/o multiple admissions for acute encephalopathy since 2016 of various causes, previous PCA CVA in 7591 complicated by seizures, previous NSTEMI with non-obstructive CAD on cath, alcohol abuse, THC abuse, chronic back pain s/p spinal stimulator or chronic oxycodone, depression, anxiety who presents with 2 days of confusion.   Acute Encephalopathy: Resolved overnight. Most likely triggered by new medication, Alfuzosin. Also takes oxycodone more frequently than  prescribed but he denies taking it very often the last few days. After speaking with the wife, she is really unsure about what medicines he's been taking. He manages them himself. Recommend they f/u with his PCP to discuss tapering off the gabapentin since it doesn't help with his pain and discuss other pain management strategies so he doesn't need to take oxycodone so frequently - discontinue alfuzosin - taper gabapentin - f/u with PCP  Seizure Disorder: continue depakote   HTN: continue home amlodipine HLD: continue home atorvastatin  GERD: continue home pantoprazole   Dispo: Anticipated discharge today   Isabelle Course, MD 11/07/2018, 11:02 AM Pager: [phone]

## 2018-11-07 NOTE — H&P (Signed)
Internal Medicine Attending Admission Note Date: 11/07/2018  Patient name: William Jordan Medical record number: [id number] Date of birth: 1945/11/14 Age: 73 y.o. Gender: male  I saw and evaluated the patient. I reviewed the resident's note and I agree with the resident's findings and plan as documented in the resident's note.  Chief Complaint(s): Confusion for the last 2 days.  History - key components related to admission:  William Jordan is a 73 year old man with a history of multiple hospital admissions for acute encephalopathy since 2016 of various causes, the last being in September 7893, PCA CVA complicated by seizures, non-ST elevation myocardial infarction with nonobstructive coronary artery disease on cath, alcohol abuse, THC abuse, chronic back pain status post a spinal stimulator on chronic oxycodone, depression, and anxiety who presents with 2 days of confusion.  Our initial history was gleaned from the chart as the wife was unavailable to provide history and could not be reached by phone.  Apparently he had a fever to 101 a few days ago and was recently started on alfuzosin for urinary issues.  Per report, this has caused confusion in the past although it is not a well described side effect for this medication.  When seen on rounds the morning after admission William Jordan was back to his baseline able to provide a cogent history.  He states he did not remember much of yesterday although knows that he was confused.  He also states that for some reason his medications had been strewn across the floor but he is convinced that he took them all appropriately as he knows which medications he is to take.  With regards to his pain medication he states that he has been taking them every 6 hours rather than what he feels they are prescribed at which is every 4 hours.  He says that every 6 hours is his usual dosing and is not higher than what he normally takes.  He was without any specific complaints to suggest  an infection and had no other acute complaints.  He felt he was back to his baseline.  Physical Exam - key components related to admission:  Vitals:   11/06/18 2325 11/07/18 0333 11/07/18 0418 11/07/18 0809  BP: 132/88 111/63 111/63 (!) 152/82  Pulse: 80 75 75 83  Resp: 20 18 18 20   Temp: 98.1 F (36.7 C) 98.2 F (36.8 C) 98.2 F (36.8 C) 98.1 F (36.7 C)  TempSrc: Oral Oral Oral Oral  SpO2: 92% 96%  95%  Height:   5\' 10"  (1.778 m)    General: Well-developed, well-nourished, man lying comfortably in bed in no acute distress.  He answers all questions appropriately and is able to give specific details about his history.  There are no overt clues to confusion.  It should be noted that his [place] in September was 20/30 suggesting baseline mild cognitive impairment at the very least.  Lab results:  Basic Metabolic Panel: Recent Labs    11/05/18 2118 11/06/18 0835 11/07/18 0614  NA 135  --  137  K 3.4*  --  3.2*  CL 94*  --  96*  CO2 29  --  26  GLUCOSE 119*  --  90  BUN 17  --  17  CREATININE 1.01 0.84 0.95  CALCIUM 9.2  --  9.0   Liver Function Tests: Recent Labs    11/05/18 2118  AST 31  ALT 19  ALKPHOS 69  BILITOT 1.0  PROT 7.2  ALBUMIN 4.0  Recent Labs    11/05/18 2118  LIPASE 17   CBC: Recent Labs    11/05/18 2118 11/06/18 0835 11/07/18 0614  WBC 8.7 10.1 7.7  NEUTROABS 6.5  --   --   HGB 11.3* 11.4* 11.5*  HCT 34.8* 34.4* 35.4*  MCV 90.6 90.5 90.1  PLT 167 159 166   CBG: Recent Labs    11/05/18 2120  GLUCAP 113*   Thyroid Function Tests: Recent Labs    11/06/18 0835  TSH 1.805   Anemia Panel: Recent Labs    11/06/18 0835  VITAMINB12 666   Urine Drug Screen:  Positive for opiates and benzodiazepines (appropriate as these were provided to him in the emergency department) Negative for cocaine, amphetamines, THC, and barbiturates.  Misc. Labs:  Valproic acid 38  Imaging results:   Chest x-ray: Personally reviewed.  Poor  inspiratory result.  Elevation of the right hemidiaphragm, no obvious infiltrate, left shoulder arthroplasty, spinal stimulator implantation, loop recorder implantation.  The elevated right hemidiaphragm is not new compared to the previous chest x-ray on May 15, 2018.  Head CT without contrast: Personally reviewed.  Mild atrophy for age.  Encephalomalacia of the right parietal lobe.  No acute bleed.  Lower extremity compression ultrasonography: No evidence of acute DVT.  Other results:  EKG: Personally reviewed.  Baseline interference likely secondary to the spinal stimulator, normal sinus tachycardia to 107 bpm, normal axis, normal intervals, Q wave in aVL, no LVH by voltage, good R wave progression, no ST or T wave changes.  Other than the tachycardia and baseline artifact there is no difference compared to the previous ECG on June 30, 2018.  Assessment & Plan by Problem:  William Jordan is a 73 year old man with a history of multiple hospital admissions for acute encephalopathy since 2016 of various causes, the last being in September 1025, PCA CVA complicated by seizures, non-ST elevation myocardial infarction with nonobstructive coronary artery disease on cath, alcohol abuse, THC abuse, chronic back pain status post a spinal stimulator on chronic oxycodone, depression, and anxiety who presents with 2 days of confusion.  On the morning following admission his confusion had resolved.  It should be noted that at the very least he has mild cognitive impairment based on a MOCA of 20/30 in September 2019.  He is also had frequent episodes of brief confusion from various causes since 2016.  I suspect with his mild cognitive impairment it does not take much for him to become confused.  This episode is related either to a brief viral infection that has since resolved or to the new medication afluzosin which reportedly has caused confusion in the past, even though this is an unusual side effect.  Also, he  may have inappropriately taken some of his medications when they were strewn across the floor despite his insistence that he took them correctly.  It simply took time to metabolize the excessive medications he may have taken.  Regardless, at this time he appears to be at baseline and will forever remain at risk for episodes of altered mental status as any minimal pertubation could result in confusion.  1) Transient toxic encephalopathy in the setting of underlying mild cognitive impairment: If it was related to excessive medication intake when his medicines were strewn across the floor his body has metabolized them appropriately and he is now at baseline.  We are holding the afluzosin as this was the only new medication we could identify that could temporaly be related to his confusion.  If it was secondary to a viral infraction this too has been transient and has now resolved.  No further evaluation is necessary at this time.  2) Disposition: He is stable for discharge home back with his wife and with follow-up in his primary care provider's office to further discuss his urinary issues now that we are holding the afluzosin.

## 2018-11-08 NOTE — Discharge Summary (Signed)
Name: William Jordan MRN: [id number] DOB: Oct 16, 1945 73 y.o. PCP: Townsend Roger, MD  Date of Admission: 11/05/2018  6:29 PM Date of Discharge: 11/07/2018 Attending Physician: No att. providers found  Discharge Diagnosis: 1. Toxic Encephalopathy  2. Mild cognitive impairment  Discharge Medications: Allergies as of 11/07/2018      Reactions   Ace Inhibitors Shortness Of Breath, Swelling, Other (See Comments)   Angioedema (throat and tongue became swollen)   Morphine Other (See Comments)   Causes confusion and "makes me crazy," per the patient   Sulfa Antibiotics Other (See Comments)   Unknown reaction, from childhood      Medication List    STOP taking these medications   alfuzosin 10 MG 24 hr tablet Commonly known as:  UROXATRAL   pantoprazole 40 MG tablet Commonly known as:  PROTONIX   ramelteon 8 MG tablet Commonly known as:  ROZEREM   zolpidem 5 MG tablet Commonly known as:  AMBIEN     TAKE these medications   acetaminophen 325 MG tablet Commonly known as:  TYLENOL Take 2 tablets (650 mg total) by mouth every 4 (four) hours as needed for headache or mild pain.   amLODipine 10 MG tablet Commonly known as:  NORVASC Take 10 mg by mouth daily.   aspirin 325 MG tablet Take 1 tablet (325 mg total) by mouth daily.   atorvastatin 40 MG tablet Commonly known as:  LIPITOR Take 1 tablet (40 mg total) by mouth daily at 6 PM.   cetirizine 10 MG tablet Commonly known as:  ZYRTEC Take 1 tablet (10 mg total) by mouth daily.   cholecalciferol 1000 units tablet Commonly known as:  VITAMIN D Take 2,000 Units by mouth daily.   citalopram 20 MG tablet Commonly known as:  CELEXA Take 1 tablet (20 mg total) by mouth daily.   clopidogrel 75 MG tablet Commonly known as:  PLAVIX Take 1 tablet (75 mg total) by mouth daily.   colchicine 0.6 MG tablet Take 0.6 mg by mouth 3 (three) times daily as needed (for gout flares).   divalproex 500 MG DR tablet Commonly known as:   DEPAKOTE Take 1 tablet (500 mg total) by mouth every 12 (twelve) hours.   gabapentin 600 MG tablet Commonly known as:  NEURONTIN Take 0.5 tablets (300 mg total) by mouth 3 (three) times daily. What changed:  how much to take   magnesium gluconate 500 MG tablet Commonly known as:  MAGONATE Take 500 mg by mouth 2 (two) times daily.   omeprazole 20 MG capsule Commonly known as:  PRILOSEC Take 20 mg by mouth daily.   Oxycodone HCl 20 MG Tabs Take 1 tablet (20 mg total) by mouth every 8 (eight) hours as needed. What changed:    when to take this  additional instructions   vitamin B-12 100 MCG tablet Commonly known as:  CYANOCOBALAMIN Take 1 tablet (100 mcg total) by mouth daily.       Disposition and follow-up:   [name] was discharged from [facility] in Good condition.  At the hospital follow up visit please address:  1.  Please continue med rec. Discuss tapering off gabapentin as it doesn't seem to help his pain and could contribute to confusion.   Discuss other ways to manage his urinary symptoms since we are discontinuing Alfuzosin  2.  Labs / imaging needed at time of follow-up: none  3.  Pending labs/ test needing follow-up: none  Follow-up Appointments:  Follow-up Information    Nona Dell, Corene Cornea, MD. Schedule an appointment as soon as possible for a visit in 1 week(s).   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   Acute  Encephalopathy:Resolved overnight. Our workup did not reveal any source of infection, electrolyte abnormalities, unexpected substance use, endocrine abnormalities, or acute intracranial pathology. Most likely triggered by new medication, Alfuzosin. Also takes oxycodone more frequently than prescribed but he denies taking it very often the last few days. After speaking with the wife, she is really unsure about what medicines he's been taking. He manages them himself. Recommend they f/u with his PCP to discuss tapering off the gabapentin since it doesn't help with his pain and discuss other pain management strategies so he doesn't need to take oxycodone so frequently - discontinue alfuzosin - f/u with PCP to discuss tapering gabapentin   Discharge Vitals:   BP 121/87 (BP Location: Left Arm)   Pulse 92   Temp 97.8 F (36.6 C) (Oral)   Resp 16   Ht 5\' 10"  (1.778 m)   SpO2 99%   BMI 25.83 kg/m   Pertinent Labs, Studies, and Procedures:  BMP Latest Ref Rng & Units 11/07/2018 11/06/2018 11/05/2018  Glucose 70 - 99 mg/dL 90 - 119(H)  BUN 8 - 23 mg/dL 17 - 17  Creatinine 0.61 - 1.24 mg/dL 0.95 0.84 1.01  Sodium 135 - 145 mmol/L 137 - 135  Potassium 3.5 - 5.1 mmol/L 3.2(L) - 3.4(L)  Chloride 98 - 111 mmol/L 96(L) - 94(L)  CO2 22 - 32 mmol/L 26 - 29  Calcium 8.9 - 10.3 mg/dL 9.0 - 9.2   CBC Latest Ref Rng & Units 11/07/2018 11/06/2018 11/05/2018  WBC 4.0 - 10.5 K/uL 7.7 10.1 8.7  Hemoglobin 13.0 - 17.0 g/dL 11.5(L) 11.4(L) 11.3(L)  Hematocrit 39.0 - 52.0 % 35.4(L) 34.4(L) 34.8(L)  Platelets 150 - 400 K/uL 166 159 167   TSH and B12 WNLs Flu negative   Head CT: 1. No acute intracranial hemorrhage. 2. Mild age-related atrophy and chronic microvascular ischemic changes. Areas of old infarct and encephalomalacia involving the right parietal convexity and right occipital lobe.  CXR: Low lung volumes. Electronic recording device over the left chest. Ascending thoracic stimulator leads. Multiple  chronic compression deformities of the thoracolumbar spine. Left shoulder replacement.  Subsegmental atelectasis at the bases. Stable cardiomediastinal silhouette. Aortic atherosclerosis. No pneumothorax.   Discharge Instructions: Discharge Instructions    Diet - low sodium heart healthy   Complete by:  As directed    Discharge instructions   Complete by:  As directed    [name], William Jordan were admitted to the hospital because you got very confused and agitated. We did blood work and imaging of your head and chest and could not find any new explanation for your confusion. I think it must be related to the medications you take. Your previous stroke has made your brain very sensitive to medications and more likely to become confused and altered.  I would recommend scheduling another appointment with your primary doctor to discuss tapering off the gabapentin since it doesn't help with your pain and it can also cause confusion. Continue to be cautious with how much oxycodone you take. Also avoid zolpidem (Ambien) and stop taking the new prostate medicine (alfuzosin).   If you notice worsening confusion please call your doctor or come back to the hospital.   Increase activity slowly   Complete by:  As directed       Signed: Isabelle Course, MD 11/08/2018, 11:13 AM   Pager: [phone]

## 2018-11-10 DIAGNOSIS — R4182 Altered mental status, unspecified: Secondary | ICD-10-CM | POA: Diagnosis not present

## 2018-11-11 ENCOUNTER — Ambulatory Visit (INDEPENDENT_AMBULATORY_CARE_PROVIDER_SITE_OTHER): Payer: PPO | Admitting: *Deleted

## 2018-11-11 DIAGNOSIS — I63411 Cerebral infarction due to embolism of right middle cerebral artery: Secondary | ICD-10-CM

## 2018-11-12 LAB — CUP PACEART REMOTE DEVICE CHECK
Date Time Interrogation Session: 20200227153612
Implantable Pulse Generator Implant Date: 20190813

## 2018-11-17 NOTE — Progress Notes (Signed)
Carelink Summary Report / Loop Recorder 

## 2018-11-19 ENCOUNTER — Encounter: Payer: Self-pay | Admitting: Internal Medicine

## 2018-11-19 NOTE — Progress Notes (Unsigned)
LINQ alert for tachy episode. 14 beats of tachycardia, rate 200bpm. Normal EF by echo 04/2018.  Will route to Dr Lovena Le for review.

## 2018-11-20 NOTE — Progress Notes (Unsigned)
No additional treatment. EF is normal.

## 2018-11-25 DIAGNOSIS — G894 Chronic pain syndrome: Secondary | ICD-10-CM | POA: Diagnosis not present

## 2018-11-25 DIAGNOSIS — G603 Idiopathic progressive neuropathy: Secondary | ICD-10-CM | POA: Diagnosis not present

## 2018-11-25 DIAGNOSIS — M47816 Spondylosis without myelopathy or radiculopathy, lumbar region: Secondary | ICD-10-CM | POA: Diagnosis not present

## 2018-11-25 DIAGNOSIS — Z79891 Long term (current) use of opiate analgesic: Secondary | ICD-10-CM | POA: Diagnosis not present

## 2018-12-13 ENCOUNTER — Ambulatory Visit (INDEPENDENT_AMBULATORY_CARE_PROVIDER_SITE_OTHER): Payer: PPO | Admitting: *Deleted

## 2018-12-13 ENCOUNTER — Other Ambulatory Visit: Payer: Self-pay

## 2018-12-13 DIAGNOSIS — I63411 Cerebral infarction due to embolism of right middle cerebral artery: Secondary | ICD-10-CM | POA: Diagnosis not present

## 2018-12-14 LAB — CUP PACEART REMOTE DEVICE CHECK
Date Time Interrogation Session: 20200331144224
Implantable Pulse Generator Implant Date: 20190813

## 2018-12-21 NOTE — Progress Notes (Signed)
Carelink Summary Report / Loop Recorder 

## 2019-01-24 ENCOUNTER — Ambulatory Visit (INDEPENDENT_AMBULATORY_CARE_PROVIDER_SITE_OTHER): Payer: PPO | Admitting: *Deleted

## 2019-01-24 ENCOUNTER — Other Ambulatory Visit: Payer: Self-pay

## 2019-01-24 DIAGNOSIS — Z79891 Long term (current) use of opiate analgesic: Secondary | ICD-10-CM | POA: Diagnosis not present

## 2019-01-24 DIAGNOSIS — M47816 Spondylosis without myelopathy or radiculopathy, lumbar region: Secondary | ICD-10-CM | POA: Diagnosis not present

## 2019-01-24 DIAGNOSIS — G894 Chronic pain syndrome: Secondary | ICD-10-CM | POA: Diagnosis not present

## 2019-01-24 DIAGNOSIS — G603 Idiopathic progressive neuropathy: Secondary | ICD-10-CM | POA: Diagnosis not present

## 2019-01-24 DIAGNOSIS — I63411 Cerebral infarction due to embolism of right middle cerebral artery: Secondary | ICD-10-CM

## 2019-01-24 LAB — CUP PACEART REMOTE DEVICE CHECK
Date Time Interrogation Session: 20200511121047
Implantable Pulse Generator Implant Date: 20190813

## 2019-02-01 NOTE — Progress Notes (Signed)
Carelink Summary Report / Loop Recorder 

## 2019-02-21 DIAGNOSIS — G603 Idiopathic progressive neuropathy: Secondary | ICD-10-CM | POA: Diagnosis not present

## 2019-02-21 DIAGNOSIS — G894 Chronic pain syndrome: Secondary | ICD-10-CM | POA: Diagnosis not present

## 2019-02-21 DIAGNOSIS — Z79891 Long term (current) use of opiate analgesic: Secondary | ICD-10-CM | POA: Diagnosis not present

## 2019-02-21 DIAGNOSIS — M47816 Spondylosis without myelopathy or radiculopathy, lumbar region: Secondary | ICD-10-CM | POA: Diagnosis not present

## 2019-02-28 ENCOUNTER — Ambulatory Visit (INDEPENDENT_AMBULATORY_CARE_PROVIDER_SITE_OTHER): Payer: PPO | Admitting: *Deleted

## 2019-02-28 DIAGNOSIS — I63411 Cerebral infarction due to embolism of right middle cerebral artery: Secondary | ICD-10-CM | POA: Diagnosis not present

## 2019-02-28 LAB — CUP PACEART REMOTE DEVICE CHECK
Date Time Interrogation Session: 20200613123606
Implantable Pulse Generator Implant Date: 20190813

## 2019-03-01 DIAGNOSIS — M109 Gout, unspecified: Secondary | ICD-10-CM | POA: Diagnosis not present

## 2019-03-07 NOTE — Progress Notes (Signed)
Carelink Summary Report / Loop Recorder 

## 2019-03-15 ENCOUNTER — Other Ambulatory Visit: Payer: Self-pay

## 2019-03-15 ENCOUNTER — Emergency Department (HOSPITAL_COMMUNITY)
Admission: EM | Admit: 2019-03-15 | Discharge: 2019-03-15 | Disposition: A | Discharge status: Home / Self Care | Payer: PPO | Attending: Emergency Medicine | Admitting: Emergency Medicine

## 2019-03-15 ENCOUNTER — Encounter (HOSPITAL_COMMUNITY): Payer: Self-pay | Admitting: Emergency Medicine

## 2019-03-15 ENCOUNTER — Emergency Department (HOSPITAL_COMMUNITY): Payer: PPO

## 2019-03-15 DIAGNOSIS — I10 Essential (primary) hypertension: Secondary | ICD-10-CM | POA: Diagnosis not present

## 2019-03-15 DIAGNOSIS — R262 Difficulty in walking, not elsewhere classified: Secondary | ICD-10-CM | POA: Diagnosis not present

## 2019-03-15 DIAGNOSIS — Z96652 Presence of left artificial knee joint: Secondary | ICD-10-CM | POA: Diagnosis not present

## 2019-03-15 DIAGNOSIS — R531 Weakness: Secondary | ICD-10-CM | POA: Diagnosis not present

## 2019-03-15 DIAGNOSIS — Z96641 Presence of right artificial hip joint: Secondary | ICD-10-CM | POA: Diagnosis not present

## 2019-03-15 DIAGNOSIS — Z7982 Long term (current) use of aspirin: Secondary | ICD-10-CM | POA: Diagnosis not present

## 2019-03-15 DIAGNOSIS — Z87891 Personal history of nicotine dependence: Secondary | ICD-10-CM | POA: Diagnosis not present

## 2019-03-15 DIAGNOSIS — M24542 Contracture, left hand: Secondary | ICD-10-CM | POA: Diagnosis not present

## 2019-03-15 DIAGNOSIS — I69354 Hemiplegia and hemiparesis following cerebral infarction affecting left non-dominant side: Secondary | ICD-10-CM | POA: Insufficient documentation

## 2019-03-15 DIAGNOSIS — Z7901 Long term (current) use of anticoagulants: Secondary | ICD-10-CM | POA: Insufficient documentation

## 2019-03-15 DIAGNOSIS — R202 Paresthesia of skin: Secondary | ICD-10-CM | POA: Diagnosis not present

## 2019-03-15 DIAGNOSIS — Z79899 Other long term (current) drug therapy: Secondary | ICD-10-CM | POA: Insufficient documentation

## 2019-03-15 DIAGNOSIS — R9082 White matter disease, unspecified: Secondary | ICD-10-CM | POA: Diagnosis not present

## 2019-03-15 DIAGNOSIS — R200 Anesthesia of skin: Secondary | ICD-10-CM | POA: Insufficient documentation

## 2019-03-15 DIAGNOSIS — M5489 Other dorsalgia: Secondary | ICD-10-CM | POA: Diagnosis not present

## 2019-03-15 LAB — COMPREHENSIVE METABOLIC PANEL
ALT: 18 U/L (ref 0–44)
AST: 24 U/L (ref 15–41)
Albumin: 4.2 g/dL (ref 3.5–5.0)
Alkaline Phosphatase: 77 U/L (ref 38–126)
Anion gap: 15 (ref 5–15)
BUN: 15 mg/dL (ref 8–23)
CO2: 22 mmol/L (ref 22–32)
Calcium: 9.5 mg/dL (ref 8.9–10.3)
Chloride: 99 mmol/L (ref 98–111)
Creatinine, Ser: 0.96 mg/dL (ref 0.61–1.24)
GFR calc Af Amer: >60 mL/min (ref >60)
GFR calc non Af Amer: >60 mL/min (ref >60)
Glucose, Bld: 102 mg/dL — ABNORMAL HIGH (ref 70–99)
Potassium: 3.2 mmol/L — ABNORMAL LOW (ref 3.5–5.1)
Sodium: 136 mmol/L (ref 135–145)
Total Bilirubin: 0.8 mg/dL (ref 0.3–1.2)
Total Protein: 7.1 g/dL (ref 6.5–8.1)

## 2019-03-15 LAB — I-STAT CHEM 8, ED
BUN: 19 mg/dL (ref 8–23)
Calcium, Ion: 1.12 mmol/L — ABNORMAL LOW (ref 1.15–1.40)
Chloride: 98 mmol/L (ref 98–111)
Creatinine, Ser: 0.9 mg/dL (ref 0.61–1.24)
Glucose, Bld: 100 mg/dL — ABNORMAL HIGH (ref 70–99)
HCT: 43 % (ref 39.0–52.0)
Hemoglobin: 14.6 g/dL (ref 13.0–17.0)
Potassium: 3.2 mmol/L — ABNORMAL LOW (ref 3.5–5.1)
Sodium: 136 mmol/L (ref 135–145)
TCO2: 23 mmol/L (ref 22–32)

## 2019-03-15 LAB — CBC
HCT: 41.5 % (ref 39.0–52.0)
Hemoglobin: 13.7 g/dL (ref 13.0–17.0)
MCH: 30.2 pg (ref 26.0–34.0)
MCHC: 33 g/dL (ref 30.0–36.0)
MCV: 91.4 fL (ref 80.0–100.0)
Platelets: 366 10*3/uL (ref 150–400)
RBC: 4.54 MIL/uL (ref 4.22–5.81)
RDW: 12.6 % (ref 11.5–15.5)
WBC: 12.6 10*3/uL — ABNORMAL HIGH (ref 4.0–10.5)
nRBC: 0 % (ref 0.0–0.2)

## 2019-03-15 LAB — ETHANOL: Alcohol, Ethyl (B): <10 mg/dL (ref <10)

## 2019-03-15 LAB — CBG MONITORING, ED: Glucose-Capillary: 85 mg/dL (ref 70–99)

## 2019-03-15 LAB — DIFFERENTIAL
Abs Immature Granulocytes: 0.1 10*3/uL — ABNORMAL HIGH (ref 0.00–0.07)
Basophils Absolute: 0.1 10*3/uL (ref 0.0–0.1)
Basophils Relative: 1 %
Eosinophils Absolute: 0 10*3/uL (ref 0.0–0.5)
Eosinophils Relative: 0 %
Immature Granulocytes: 1 %
Lymphocytes Relative: 14 %
Lymphs Abs: 1.8 10*3/uL (ref 0.7–4.0)
Monocytes Absolute: 1.1 10*3/uL — ABNORMAL HIGH (ref 0.1–1.0)
Monocytes Relative: 9 %
Neutro Abs: 9.5 10*3/uL — ABNORMAL HIGH (ref 1.7–7.7)
Neutrophils Relative %: 75 %

## 2019-03-15 LAB — APTT: aPTT: 29 seconds (ref 24–36)

## 2019-03-15 LAB — PROTIME-INR
INR: 1 (ref 0.8–1.2)
Prothrombin Time: 13.1 seconds (ref 11.4–15.2)

## 2019-03-15 MED ORDER — SODIUM CHLORIDE 0.9% FLUSH: 3.0000 mL | Freq: Once | INTRAVENOUS | Status: DC

## 2019-03-15 NOTE — ED Triage Notes (Signed)
Pt has hx of stroke. Complains of tingling in bilateral feet, left hand balled  In a fist but he is able to open it with effort. Pt complains of difficulty walking. These symptoms have been going on for several weeks,but worsening. Denies headache,denies slurred speech.  BP 127/79, HR 98, 97 room air.CBG 137

## 2019-03-15 NOTE — ED Provider Notes (Signed)
[facility] EMERGENCY DEPARTMENT Provider Note   CSN: [id number] Arrival date & time: 03/15/19  1255    History   Chief Complaint Chief Complaint  Patient presents with  . Stroke Symptoms    HPI William Jordan is a 73 y.o. male.     HPI Pt presents for eValuation of possible stroke symptoms.  Patient states the symptoms have been going on for several weeks.  Patient has a sensation of numbness in his left hand.  He states that intermittently his left hand will all of a sudden straighten out.  He does not feel like he can control that action.  He is also had pain in his feet and a sensation of unsteadiness.  Patient does have a history of prior strokes.  He also has history of heart disease.  Patient has not seen anyone for the symptoms since it started.  They were not worse today but he finally decided to get it checked out.  He denies any trouble with his speech.  No trouble with his vision. Past Medical History:  Diagnosis Date  . Angioedema    from Benazepril reaction  . Anxiety    takes Celexa  . Arthritis   . Chronic back pain   . CVA (cerebral vascular accident) ([place])   . Enlarged prostate   . History of blood transfusion   . Hypertension   . MI (myocardial infarction) ([place])   . Neuropathy     Patient Active Problem List   Diagnosis Date Noted  . Mild cognitive impairment   . Acute encephalopathy 11/06/2018  . Left leg swelling 05/16/2018  . Alcohol abuse 05/16/2018  . Depression 05/16/2018  . Toxic encephalopathy   . Marijuana abuse   . Encephalopathy 05/15/2018  . Gait disturbance, post-stroke   . History of CVA (cerebrovascular accident) 05/04/2018  . Spastic hemiparesis of left nondominant side due to acute cerebral infarction ([place])   . 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[place])   . Essential hypertension   . Hyponatremia   . Non-ST elevation (NSTEMI) myocardial infarction ([place])   . Ventricular tachycardia ([place])   . CVA (cerebral vascular accident)  ([place]) 04/25/2018  . Angioedema 04/18/2018  . History of right inguinal hernia repair 02/10/2018  . Acute kidney injury ([place]) 12/10/2017  . Urinary retention 04/25/2016  . Acute renal failure syndrome ([place])   . Low back pain with sciatica 04/03/2015  . H/O total hip arthroplasty 04/03/2015  . Idiopathic peripheral neuropathy 09/29/2014  . Chronic pain 09/28/2014  . DDD (degenerative disc disease), lumbar 12/30/2012  . Degenerative arthritis of thoracic spine 12/30/2012  . Localized osteoarthrosis 12/13/2012  . Chronic pain associated with significant psychosocial dysfunction 12/13/2012  . Polypharmacy 12/13/2012  . Long term current use of opiate analgesic 12/13/2012    Past Surgical History:  Procedure Laterality Date  . CARDIAC CATHETERIZATION  05/03/2018  . COLONOSCOPY     one polyp removed - benign  . ELBOW SURGERY Right   . HERNIA REPAIR     umbilical  . JOINT REPLACEMENT Left    knee, x 2  . JOINT REPLACEMENT Right    hip  . JOINT REPLACEMENT Left    partial shoulder  . LEFT HEART CATH AND CORONARY ANGIOGRAPHY N/A 05/03/2018   Procedure: LEFT HEART CATH AND CORONARY ANGIOGRAPHY;  Surgeon: Martinique, Peter M, MD;  Location: [facility] CV [facility];  Service: Cardiovascular;  Laterality: N/A;  . LOOP RECORDER INSERTION N/A 04/27/2018   Procedure: LOOP RECORDER INSERTION;  Surgeon: Evans Lance, MD;  Location: [facility];  Service: Cardiovascular;  Laterality: N/A;  . SPINAL CORD STIMULATOR INSERTION N/A 09/28/2014   Procedure: LUMBAR SPINAL CORD STIMULATOR INSERTION;  Surgeon: Melina Schools, MD;  Location: [facility];  Service: Orthopedics;  Laterality: N/A;  . TEE WITHOUT CARDIOVERSION N/A 04/27/2018   Procedure: TRANSESOPHAGEAL ECHOCARDIOGRAM (TEE);  Surgeon: Larey Dresser, MD;  Location: [facility] ENDOSCOPY;  Service: Cardiovascular;  Laterality: N/A;  . TONSILLECTOMY    . VASECTOMY          Home Medications    Prior to Admission medications   Medication Sig Start Date  End Date Taking? Authorizing Provider  acetaminophen (TYLENOL) 325 MG tablet Take 2 tablets (650 mg total) by mouth every 4 (four) hours as needed for headache or mild pain. 05/06/18   Angiulli, Lavon Paganini, PA-C  amLODipine (NORVASC) 10 MG tablet Take 10 mg by mouth daily.    [provider]  aspirin 325 MG tablet Take 1 tablet (325 mg total) by mouth daily. 04/29/18   Asencion Noble, MD  atorvastatin (LIPITOR) 40 MG tablet Take 1 tablet (40 mg total) by mouth daily at 6 PM. 05/06/18   Angiulli, Lavon Paganini, PA-C  cetirizine (ZYRTEC) 10 MG tablet Take 1 tablet (10 mg total) by mouth daily. 05/17/18   Seawell, Jaimie A, DO  cholecalciferol (VITAMIN D) 1000 units tablet Take 2,000 Units by mouth daily.    [provider]  citalopram (CELEXA) 20 MG tablet Take 1 tablet (20 mg total) by mouth daily. 05/18/18   Seawell, Jaimie A, DO  clopidogrel (PLAVIX) 75 MG tablet Take 1 tablet (75 mg total) by mouth daily. 05/06/18   Angiulli, Lavon Paganini, PA-C  colchicine 0.6 MG tablet Take 0.6 mg by mouth 3 (three) times daily as needed (for gout flares).     [provider]  divalproex (DEPAKOTE) 500 MG DR tablet Take 1 tablet (500 mg total) by mouth every 12 (twelve) hours. 05/17/18   Seawell, Jaimie A, DO  gabapentin (NEURONTIN) 600 MG tablet Take 0.5 tablets (300 mg total) by mouth 3 (three) times daily. Patient taking differently: Take 600 mg by mouth 3 (three) times daily.  05/06/18   Angiulli, Lavon Paganini, PA-C  magnesium gluconate (MAGONATE) 500 MG tablet Take 500 mg by mouth 2 (two) times daily.    [provider]  omeprazole (PRILOSEC) 20 MG capsule Take 20 mg by mouth daily. 07/13/18   [provider]  Oxycodone HCl 20 MG TABS Take 1 tablet (20 mg total) by mouth every 8 (eight) hours as needed. Patient taking differently: Take 20 mg by mouth See admin instructions. Take 20 mg by mouth every 4-6 hours as needed for pain 12/12/17   Roxan Hockey, MD  vitamin B-12  (CYANOCOBALAMIN) 100 MCG tablet Take 1 tablet (100 mcg total) by mouth daily. 08/17/15   Caren Griffins, MD    Family History Family History  Problem Relation Age of Onset  . Heart disease Father     Social History Social History   Tobacco Use  . Smoking status: Current Every Day Smoker    Types: E-cigarettes, Cigars  . Smokeless tobacco: Former Systems developer    Types: Snuff  . Tobacco comment: does not use any tobacco products any more  Substance Use Topics  . Alcohol use: Yes    Comment: occ  . Drug use: No     Allergies   Ace inhibitors, Morphine, and Sulfa antibiotics  Review of Systems Review of Systems  All other systems reviewed and are negative.    Physical Exam Updated Vital Signs BP 117/66   Pulse 75   Temp 98.1 F (36.7 C) (Oral)   Resp 19   Ht 1.753 m (5\' 9" )   Wt 80.3 kg   SpO2 98%   BMI 26.14 kg/m   Physical Exam Vitals signs and nursing note reviewed.  Constitutional:      General: He is not in acute distress.    Appearance: He is well-developed.  HENT:     Head: Normocephalic and atraumatic.     Right Ear: External ear normal.     Left Ear: External ear normal.  Eyes:     General: No scleral icterus.       Right eye: No discharge.        Left eye: No discharge.     Conjunctiva/sclera: Conjunctivae normal.  Neck:     Musculoskeletal: Neck supple.     Trachea: No tracheal deviation.  Cardiovascular:     Rate and Rhythm: Normal rate and regular rhythm.  Pulmonary:     Effort: Pulmonary effort is normal. No respiratory distress.     Breath sounds: Normal breath sounds. No stridor. No wheezing or rales.  Abdominal:     General: Bowel sounds are normal. There is no distension.     Palpations: Abdomen is soft.     Tenderness: There is no abdominal tenderness. There is no guarding or rebound.  Musculoskeletal:        General: No tenderness.  Skin:    General: Skin is warm and dry.     Findings: No rash.  Neurological:     Mental  Status: He is alert and oriented to person, place, and time.     Cranial Nerves: No cranial nerve deficit (No facial droop, extraocular movements intact, tongue midline ).     Sensory: No sensory deficit.     Motor: No abnormal muscle tone or seizure activity.     Coordination: Coordination normal.     Comments: No pronator drift bilateral upper extrem, able to hold both legs off bed for 5 seconds, sensation intact in all extremities, no visual field cuts, no left or right sided neglect, normal finger-nose exam bilaterally, no nystagmus noted       ED Treatments / Results  Labs (all labs ordered are listed, but only abnormal results are displayed) Labs Reviewed  CBC - Abnormal; Notable for the following components:      Result Value   WBC 12.6 (*)    All other components within normal limits  DIFFERENTIAL - Abnormal; Notable for the following components:   Neutro Abs 9.5 (*)    Monocytes Absolute 1.1 (*)    Abs Immature Granulocytes 0.10 (*)    All other components within normal limits  COMPREHENSIVE METABOLIC PANEL - Abnormal; Notable for the following components:   Potassium 3.2 (*)    Glucose, Bld 102 (*)    All other components within normal limits  I-STAT CHEM 8, ED - Abnormal; Notable for the following components:   Potassium 3.2 (*)    Glucose, Bld 100 (*)    Calcium, Ion 1.12 (*)    All other components within normal limits  PROTIME-INR  APTT  ETHANOL  CBG MONITORING, ED    EKG  EKG Interpretation  Date/Time:  Tuesday March 15 2019 13:02:48 EDT Ventricular Rate:  93 PR Interval:  172 QRS Duration: 86 QT Interval:  354 QTC Calculation: 440 R Axis:   63 Text Interpretation:  Normal sinus rhythm Normal ECG No significant change since last tracing Confirmed by Dorie Rank [phone]) on 03/15/2019 3:12:04 PM        Radiology Ct Head Wo Contrast  Result Date: 03/15/2019 CLINICAL DATA:  Left hand contracture.  Possible stroke. EXAM: CT HEAD WITHOUT CONTRAST  TECHNIQUE: Contiguous axial images were obtained from the base of the skull through the vertex without intravenous contrast. COMPARISON:  CT scan of November 06, 2018. FINDINGS: Brain: Mild chronic ischemic white matter disease is noted. Old right parietal infarction is noted. No mass effect or midline shift is noted. Ventricular size is within normal limits. There is no evidence of mass lesion, hemorrhage or acute infarction. Vascular: No hyperdense vessel or unexpected calcification. Skull: Normal. Negative for fracture or focal lesion. Sinuses/Orbits: No acute finding. Other: None. IMPRESSION: Mild chronic ischemic white matter disease. Old right parietal infarction. No acute intracranial abnormality seen. Electronically Signed   By: Marijo Conception M.D.   On: 03/15/2019 13:40    Procedures Procedures (including critical care time)  Medications Ordered in ED Medications  sodium chloride flush (NS) 0.9 % injection 3 mL (has no administration in time range)     Initial Impression / Assessment and Plan / ED Course  I have reviewed the triage vital signs and the nursing notes.  Pertinent labs & imaging results that were available during my care of the patient were reviewed by me and considered in my medical decision making (see chart for details).  Clinical Course as of Mar 14 1512  Tue Mar 15, 2019  1512 Labs reviewed.  No significant abnormalities.  CT scan without acute findings.   [JK]    Clinical Course User Index [JK] Dorie Rank, MD   Patient presented to the emergency room with complaints of numbness issues with his balance and an abnormal movement of his left hand.  Patient did not demonstrate any abnormalities on his neurologic exam.  He had no focal deficits.  No signs of any ataxia or movement disorder.  No seizure-like activity noted.  Patient had a CT scan but unfortunately we could not do an MRI because he has an implanted stimulator.  Overall have a low suspicion for acute  stroke.  Patient was reassured by his visit today.  I think he can safely follow-up with a neurologist for further evaluation.  Warning signs precautions discussed. Final Clinical Impressions(s) / ED Diagnoses   Final diagnoses:  Numbness    ED Discharge Orders    None       Dorie Rank, MD 03/15/19 1513

## 2019-03-15 NOTE — Discharge Instructions (Signed)
Make a follow-up appointment with the neurologist for further evaluation.  Consider [facility] or [facility].  I have provided their office numbers on the discharge paperwork.  Return as needed for worsening symptoms

## 2019-03-15 NOTE — ED Notes (Signed)
Pt verbalized understanding of discharge instructions and denies any further questions at this time.   

## 2019-03-15 NOTE — ED Notes (Signed)
Pt report numbness in right hand and feet. Pt has neuropathy. Pt reports changes in balance in the last week.

## 2019-03-17 ENCOUNTER — Telehealth: Payer: Self-pay

## 2019-03-17 NOTE — Telephone Encounter (Signed)
Spoke with patient regarding disconnected monitor 

## 2019-03-21 DIAGNOSIS — I951 Orthostatic hypotension: Secondary | ICD-10-CM | POA: Diagnosis not present

## 2019-03-31 ENCOUNTER — Ambulatory Visit (INDEPENDENT_AMBULATORY_CARE_PROVIDER_SITE_OTHER): Payer: PPO | Admitting: *Deleted

## 2019-03-31 DIAGNOSIS — I63411 Cerebral infarction due to embolism of right middle cerebral artery: Secondary | ICD-10-CM

## 2019-03-31 LAB — CUP PACEART REMOTE DEVICE CHECK
Date Time Interrogation Session: 20200716161202
Implantable Pulse Generator Implant Date: 20190813

## 2019-04-07 NOTE — Progress Notes (Signed)
Carelink Summary Report / Loop Recorder 

## 2019-04-19 DIAGNOSIS — G603 Idiopathic progressive neuropathy: Secondary | ICD-10-CM | POA: Diagnosis not present

## 2019-04-19 DIAGNOSIS — Z79891 Long term (current) use of opiate analgesic: Secondary | ICD-10-CM | POA: Diagnosis not present

## 2019-04-19 DIAGNOSIS — M47816 Spondylosis without myelopathy or radiculopathy, lumbar region: Secondary | ICD-10-CM | POA: Diagnosis not present

## 2019-04-19 DIAGNOSIS — G894 Chronic pain syndrome: Secondary | ICD-10-CM | POA: Diagnosis not present

## 2019-05-03 ENCOUNTER — Ambulatory Visit (INDEPENDENT_AMBULATORY_CARE_PROVIDER_SITE_OTHER): Payer: PPO | Admitting: *Deleted

## 2019-05-03 DIAGNOSIS — I63411 Cerebral infarction due to embolism of right middle cerebral artery: Secondary | ICD-10-CM | POA: Diagnosis not present

## 2019-05-03 LAB — CUP PACEART REMOTE DEVICE CHECK
Date Time Interrogation Session: 20200818155616
Implantable Pulse Generator Implant Date: 20190813

## 2019-05-06 ENCOUNTER — Telehealth: Payer: Self-pay

## 2019-05-06 NOTE — Telephone Encounter (Signed)
Spoke with patient to regarding disconnected monitor   

## 2019-05-12 NOTE — Progress Notes (Signed)
Carelink Summary Report / Loop Recorder 

## 2019-05-30 ENCOUNTER — Ambulatory Visit (INDEPENDENT_AMBULATORY_CARE_PROVIDER_SITE_OTHER): Payer: PPO | Admitting: *Deleted

## 2019-05-30 DIAGNOSIS — I472 Ventricular tachycardia, unspecified: Secondary | ICD-10-CM

## 2019-05-30 DIAGNOSIS — I63411 Cerebral infarction due to embolism of right middle cerebral artery: Secondary | ICD-10-CM

## 2019-06-06 LAB — CUP PACEART REMOTE DEVICE CHECK
Date Time Interrogation Session: 20200920173609
Implantable Pulse Generator Implant Date: 20190813

## 2019-06-08 ENCOUNTER — Telehealth: Payer: Self-pay

## 2019-06-08 NOTE — Telephone Encounter (Signed)
Left message for patient to regarding disconnected monitor 

## 2019-06-10 NOTE — Progress Notes (Signed)
Carelink Summary Report / Loop Recorder 

## 2019-06-16 DIAGNOSIS — Z79891 Long term (current) use of opiate analgesic: Secondary | ICD-10-CM | POA: Diagnosis not present

## 2019-06-16 DIAGNOSIS — W19XXXA Unspecified fall, initial encounter: Secondary | ICD-10-CM

## 2019-06-16 DIAGNOSIS — G894 Chronic pain syndrome: Secondary | ICD-10-CM | POA: Diagnosis not present

## 2019-06-16 DIAGNOSIS — M47816 Spondylosis without myelopathy or radiculopathy, lumbar region: Secondary | ICD-10-CM | POA: Diagnosis not present

## 2019-06-16 DIAGNOSIS — G603 Idiopathic progressive neuropathy: Secondary | ICD-10-CM | POA: Diagnosis not present

## 2019-06-16 HISTORY — DX: Unspecified fall, initial encounter: W19.XXXA

## 2019-06-23 DIAGNOSIS — R0902 Hypoxemia: Secondary | ICD-10-CM | POA: Diagnosis not present

## 2019-06-23 DIAGNOSIS — R609 Edema, unspecified: Secondary | ICD-10-CM | POA: Diagnosis not present

## 2019-06-23 DIAGNOSIS — R600 Localized edema: Secondary | ICD-10-CM | POA: Diagnosis not present

## 2019-06-23 DIAGNOSIS — Z7902 Long term (current) use of antithrombotics/antiplatelets: Secondary | ICD-10-CM | POA: Diagnosis not present

## 2019-06-23 DIAGNOSIS — Z87891 Personal history of nicotine dependence: Secondary | ICD-10-CM | POA: Diagnosis not present

## 2019-06-23 DIAGNOSIS — F329 Major depressive disorder, single episode, unspecified: Secondary | ICD-10-CM | POA: Diagnosis not present

## 2019-06-23 DIAGNOSIS — G8929 Other chronic pain: Secondary | ICD-10-CM | POA: Diagnosis not present

## 2019-06-23 DIAGNOSIS — I10 Essential (primary) hypertension: Secondary | ICD-10-CM | POA: Diagnosis not present

## 2019-06-23 DIAGNOSIS — M109 Gout, unspecified: Secondary | ICD-10-CM | POA: Diagnosis not present

## 2019-06-23 DIAGNOSIS — M19072 Primary osteoarthritis, left ankle and foot: Secondary | ICD-10-CM | POA: Diagnosis not present

## 2019-06-23 DIAGNOSIS — E7800 Pure hypercholesterolemia, unspecified: Secondary | ICD-10-CM | POA: Diagnosis not present

## 2019-06-23 DIAGNOSIS — M79672 Pain in left foot: Secondary | ICD-10-CM | POA: Diagnosis not present

## 2019-06-23 DIAGNOSIS — M79606 Pain in leg, unspecified: Secondary | ICD-10-CM | POA: Diagnosis not present

## 2019-06-23 DIAGNOSIS — M7989 Other specified soft tissue disorders: Secondary | ICD-10-CM | POA: Diagnosis not present

## 2019-06-23 DIAGNOSIS — Z7982 Long term (current) use of aspirin: Secondary | ICD-10-CM | POA: Diagnosis not present

## 2019-06-23 DIAGNOSIS — R52 Pain, unspecified: Secondary | ICD-10-CM | POA: Diagnosis not present

## 2019-06-28 ENCOUNTER — Emergency Department (HOSPITAL_COMMUNITY): Payer: PPO

## 2019-06-28 ENCOUNTER — Inpatient Hospital Stay (HOSPITAL_COMMUNITY)
Admission: EM | Admit: 2019-06-28 | Discharge: 2019-07-01 | DRG: 914 | Disposition: A | Discharge status: Home Health | Payer: PPO | Attending: General Surgery | Admitting: General Surgery

## 2019-06-28 ENCOUNTER — Encounter (HOSPITAL_COMMUNITY): Payer: Self-pay | Admitting: Emergency Medicine

## 2019-06-28 ENCOUNTER — Other Ambulatory Visit: Payer: Self-pay

## 2019-06-28 ENCOUNTER — Observation Stay (HOSPITAL_COMMUNITY): Payer: PPO

## 2019-06-28 DIAGNOSIS — M5489 Other dorsalgia: Secondary | ICD-10-CM | POA: Diagnosis not present

## 2019-06-28 DIAGNOSIS — W19XXXA Unspecified fall, initial encounter: Secondary | ICD-10-CM | POA: Diagnosis not present

## 2019-06-28 DIAGNOSIS — R52 Pain, unspecified: Secondary | ICD-10-CM | POA: Diagnosis not present

## 2019-06-28 DIAGNOSIS — Z20828 Contact with and (suspected) exposure to other viral communicable diseases: Secondary | ICD-10-CM | POA: Diagnosis present

## 2019-06-28 DIAGNOSIS — Y92008 Other place in unspecified non-institutional (private) residence as the place of occurrence of the external cause: Secondary | ICD-10-CM | POA: Diagnosis not present

## 2019-06-28 DIAGNOSIS — R296 Repeated falls: Secondary | ICD-10-CM | POA: Diagnosis present

## 2019-06-28 DIAGNOSIS — G629 Polyneuropathy, unspecified: Secondary | ICD-10-CM | POA: Diagnosis not present

## 2019-06-28 DIAGNOSIS — S32039A Unspecified fracture of third lumbar vertebra, initial encounter for closed fracture: Secondary | ICD-10-CM | POA: Diagnosis not present

## 2019-06-28 DIAGNOSIS — Z79899 Other long term (current) drug therapy: Secondary | ICD-10-CM

## 2019-06-28 DIAGNOSIS — S3681XA Injury of peritoneum, initial encounter: Secondary | ICD-10-CM | POA: Diagnosis not present

## 2019-06-28 DIAGNOSIS — S32019A Unspecified fracture of first lumbar vertebra, initial encounter for closed fracture: Secondary | ICD-10-CM | POA: Diagnosis not present

## 2019-06-28 DIAGNOSIS — M545 Low back pain: Secondary | ICD-10-CM | POA: Diagnosis not present

## 2019-06-28 DIAGNOSIS — G8929 Other chronic pain: Secondary | ICD-10-CM | POA: Diagnosis not present

## 2019-06-28 DIAGNOSIS — S36892A Contusion of other intra-abdominal organs, initial encounter: Secondary | ICD-10-CM | POA: Diagnosis not present

## 2019-06-28 DIAGNOSIS — S32028A Other fracture of second lumbar vertebra, initial encounter for closed fracture: Secondary | ICD-10-CM | POA: Diagnosis not present

## 2019-06-28 DIAGNOSIS — I252 Old myocardial infarction: Secondary | ICD-10-CM

## 2019-06-28 DIAGNOSIS — W010XXA Fall on same level from slipping, tripping and stumbling without subsequent striking against object, initial encounter: Secondary | ICD-10-CM | POA: Diagnosis present

## 2019-06-28 DIAGNOSIS — Z8673 Personal history of transient ischemic attack (TIA), and cerebral infarction without residual deficits: Secondary | ICD-10-CM | POA: Diagnosis not present

## 2019-06-28 DIAGNOSIS — S299XXA Unspecified injury of thorax, initial encounter: Secondary | ICD-10-CM | POA: Diagnosis not present

## 2019-06-28 DIAGNOSIS — Z471 Aftercare following joint replacement surgery: Secondary | ICD-10-CM | POA: Diagnosis not present

## 2019-06-28 DIAGNOSIS — F17290 Nicotine dependence, other tobacco product, uncomplicated: Secondary | ICD-10-CM | POA: Diagnosis present

## 2019-06-28 DIAGNOSIS — S32048A Other fracture of fourth lumbar vertebra, initial encounter for closed fracture: Secondary | ICD-10-CM | POA: Diagnosis not present

## 2019-06-28 DIAGNOSIS — Z7982 Long term (current) use of aspirin: Secondary | ICD-10-CM

## 2019-06-28 DIAGNOSIS — Z8249 Family history of ischemic heart disease and other diseases of the circulatory system: Secondary | ICD-10-CM | POA: Diagnosis not present

## 2019-06-28 DIAGNOSIS — M109 Gout, unspecified: Secondary | ICD-10-CM | POA: Diagnosis not present

## 2019-06-28 DIAGNOSIS — M25551 Pain in right hip: Secondary | ICD-10-CM | POA: Diagnosis not present

## 2019-06-28 DIAGNOSIS — S32029A Unspecified fracture of second lumbar vertebra, initial encounter for closed fracture: Secondary | ICD-10-CM | POA: Diagnosis not present

## 2019-06-28 DIAGNOSIS — S32018A Other fracture of first lumbar vertebra, initial encounter for closed fracture: Secondary | ICD-10-CM | POA: Diagnosis present

## 2019-06-28 DIAGNOSIS — I10 Essential (primary) hypertension: Secondary | ICD-10-CM | POA: Diagnosis not present

## 2019-06-28 DIAGNOSIS — Z7902 Long term (current) use of antithrombotics/antiplatelets: Secondary | ICD-10-CM | POA: Diagnosis not present

## 2019-06-28 DIAGNOSIS — S32049A Unspecified fracture of fourth lumbar vertebra, initial encounter for closed fracture: Secondary | ICD-10-CM | POA: Diagnosis not present

## 2019-06-28 DIAGNOSIS — S32038A Other fracture of third lumbar vertebra, initial encounter for closed fracture: Secondary | ICD-10-CM | POA: Diagnosis not present

## 2019-06-28 DIAGNOSIS — Z96641 Presence of right artificial hip joint: Secondary | ICD-10-CM | POA: Diagnosis not present

## 2019-06-28 DIAGNOSIS — R109 Unspecified abdominal pain: Secondary | ICD-10-CM | POA: Diagnosis not present

## 2019-06-28 DIAGNOSIS — Z03818 Encounter for observation for suspected exposure to other biological agents ruled out: Secondary | ICD-10-CM | POA: Diagnosis not present

## 2019-06-28 LAB — COMPREHENSIVE METABOLIC PANEL
ALT: 18 U/L (ref 0–44)
AST: 27 U/L (ref 15–41)
Albumin: 4.1 g/dL (ref 3.5–5.0)
Alkaline Phosphatase: 65 U/L (ref 38–126)
Anion gap: 15 (ref 5–15)
BUN: 16 mg/dL (ref 8–23)
CO2: 19 mmol/L — ABNORMAL LOW (ref 22–32)
Calcium: 9.2 mg/dL (ref 8.9–10.3)
Chloride: 97 mmol/L — ABNORMAL LOW (ref 98–111)
Creatinine, Ser: 0.93 mg/dL (ref 0.61–1.24)
GFR calc Af Amer: >60 mL/min (ref >60)
GFR calc non Af Amer: >60 mL/min (ref >60)
Glucose, Bld: 165 mg/dL — ABNORMAL HIGH (ref 70–99)
Potassium: 3.5 mmol/L (ref 3.5–5.1)
Sodium: 131 mmol/L — ABNORMAL LOW (ref 135–145)
Total Bilirubin: 0.7 mg/dL (ref 0.3–1.2)
Total Protein: 7.1 g/dL (ref 6.5–8.1)

## 2019-06-28 LAB — TYPE AND SCREEN
ABO/RH(D): O POS
Antibody Screen: NEGATIVE

## 2019-06-28 LAB — CBC WITH DIFFERENTIAL/PLATELET
Abs Immature Granulocytes: 0.19 10*3/uL — ABNORMAL HIGH (ref 0.00–0.07)
Basophils Absolute: 0 10*3/uL (ref 0.0–0.1)
Basophils Relative: 0 %
Eosinophils Absolute: 0.1 10*3/uL (ref 0.0–0.5)
Eosinophils Relative: 0 %
HCT: 37.9 % — ABNORMAL LOW (ref 39.0–52.0)
Hemoglobin: 13.4 g/dL (ref 13.0–17.0)
Immature Granulocytes: 1 %
Lymphocytes Relative: 5 %
Lymphs Abs: 0.7 10*3/uL (ref 0.7–4.0)
MCH: 31.7 pg (ref 26.0–34.0)
MCHC: 35.4 g/dL (ref 30.0–36.0)
MCV: 89.6 fL (ref 80.0–100.0)
Monocytes Absolute: 0.8 10*3/uL (ref 0.1–1.0)
Monocytes Relative: 5 %
Neutro Abs: 14.6 10*3/uL — ABNORMAL HIGH (ref 1.7–7.7)
Neutrophils Relative %: 89 %
Platelets: 280 10*3/uL (ref 150–400)
RBC: 4.23 MIL/uL (ref 4.22–5.81)
RDW: 12.9 % (ref 11.5–15.5)
WBC: 16.4 10*3/uL — ABNORMAL HIGH (ref 4.0–10.5)
nRBC: 0 % (ref 0.0–0.2)

## 2019-06-28 LAB — PROTIME-INR
INR: 1.1 (ref 0.8–1.2)
Prothrombin Time: 14.2 seconds (ref 11.4–15.2)

## 2019-06-28 MED ORDER — HYDROMORPHONE HCL 1 MG/ML IJ SOLN
0.5000 mg | INTRAMUSCULAR | Status: DC | PRN
  Administered 2019-06-28 – 2019-06-29 (×3): 0.5 mg via INTRAVENOUS
  Filled 2019-06-28 (×3): qty 1

## 2019-06-28 MED ORDER — OXYCODONE HCL 5 MG PO TABS
10.0000 mg | ORAL_TABLET | ORAL | Status: DC | PRN
  Administered 2019-06-29 (×2): 10 mg via ORAL
  Filled 2019-06-28 (×2): qty 2

## 2019-06-28 MED ORDER — OXYCODONE HCL 5 MG PO TABS
10.0000 mg | ORAL_TABLET | Freq: Once | ORAL | Status: AC
  Administered 2019-06-28: 20:00:00 10 mg via ORAL
  Filled 2019-06-28: qty 2

## 2019-06-28 MED ORDER — METOPROLOL TARTRATE 5 MG/5ML IV SOLN: 5.0000 mg | Freq: Four times a day (QID) | INTRAVENOUS | Status: DC | PRN

## 2019-06-28 MED ORDER — BISACODYL 10 MG RE SUPP: 10.0000 mg | Freq: Every day | RECTAL | Status: DC | PRN

## 2019-06-28 MED ORDER — ONDANSETRON 4 MG PO TBDP: 4.0000 mg | ORAL_TABLET | Freq: Four times a day (QID) | ORAL | Status: DC | PRN

## 2019-06-28 MED ORDER — AMLODIPINE BESYLATE 10 MG PO TABS
10.0000 mg | ORAL_TABLET | Freq: Every day | ORAL | Status: DC
  Administered 2019-06-29 – 2019-07-01 (×3): 10 mg via ORAL
  Filled 2019-06-28 (×2): qty 1
  Filled 2019-06-28: qty 2

## 2019-06-28 MED ORDER — PANTOPRAZOLE SODIUM 40 MG IV SOLR: 40.0000 mg | Freq: Every day | INTRAVENOUS | Status: DC

## 2019-06-28 MED ORDER — ONDANSETRON HCL 4 MG/2ML IJ SOLN: 4.0000 mg | Freq: Four times a day (QID) | INTRAMUSCULAR | Status: DC | PRN

## 2019-06-28 MED ORDER — CITALOPRAM HYDROBROMIDE 20 MG PO TABS
20.0000 mg | ORAL_TABLET | Freq: Every day | ORAL | Status: DC
  Administered 2019-06-29 – 2019-07-01 (×3): 20 mg via ORAL
  Filled 2019-06-28: qty 2
  Filled 2019-06-28 (×2): qty 1

## 2019-06-28 MED ORDER — FENTANYL CITRATE (PF) 100 MCG/2ML IJ SOLN
50.0000 ug | Freq: Once | INTRAMUSCULAR | Status: AC
  Administered 2019-06-28: 50 ug via INTRAVENOUS
  Filled 2019-06-28: qty 2

## 2019-06-28 MED ORDER — DOCUSATE SODIUM 100 MG PO CAPS
100.0000 mg | ORAL_CAPSULE | Freq: Two times a day (BID) | ORAL | Status: DC
  Administered 2019-06-29 – 2019-07-01 (×5): 100 mg via ORAL
  Filled 2019-06-28 (×5): qty 1

## 2019-06-28 MED ORDER — GABAPENTIN 300 MG PO CAPS
300.0000 mg | ORAL_CAPSULE | Freq: Three times a day (TID) | ORAL | Status: DC
  Administered 2019-06-29 – 2019-06-30 (×3): 300 mg via ORAL
  Filled 2019-06-28 (×3): qty 1

## 2019-06-28 MED ORDER — ACETAMINOPHEN 325 MG PO TABS: 650.0000 mg | ORAL_TABLET | ORAL | Status: DC | PRN

## 2019-06-28 MED ORDER — OXYCODONE HCL 5 MG PO TABS
5.0000 mg | ORAL_TABLET | ORAL | Status: DC | PRN
  Administered 2019-06-29: 5 mg via ORAL
  Filled 2019-06-28: qty 1

## 2019-06-28 MED ORDER — PANTOPRAZOLE SODIUM 40 MG PO TBEC
40.0000 mg | DELAYED_RELEASE_TABLET | Freq: Every day | ORAL | Status: DC
  Administered 2019-06-29 – 2019-06-30 (×2): 40 mg via ORAL
  Filled 2019-06-28 (×2): qty 1

## 2019-06-28 MED ORDER — HYDRALAZINE HCL 20 MG/ML IJ SOLN: 10.0000 mg | INTRAMUSCULAR | Status: DC | PRN

## 2019-06-28 MED ORDER — IOHEXOL 300 MG/ML  SOLN
100.0000 mL | Freq: Once | INTRAMUSCULAR | Status: AC | PRN
  Administered 2019-06-28: 100 mL via INTRAVENOUS

## 2019-06-28 MED ORDER — MAGNESIUM OXIDE 400 (241.3 MG) MG PO TABS
400.0000 mg | ORAL_TABLET | Freq: Two times a day (BID) | ORAL | Status: DC
  Administered 2019-06-29 – 2019-07-01 (×6): 400 mg via ORAL
  Filled 2019-06-28 (×6): qty 1

## 2019-06-28 MED ORDER — OXYCODONE HCL 5 MG PO TABS
10.0000 mg | ORAL_TABLET | Freq: Once | ORAL | Status: AC
  Administered 2019-06-28: 10 mg via ORAL
  Filled 2019-06-28: qty 2

## 2019-06-28 MED ORDER — COLCHICINE 0.6 MG PO TABS: 0.6000 mg | ORAL_TABLET | Freq: Three times a day (TID) | ORAL | Status: DC | PRN

## 2019-06-28 MED ORDER — SODIUM CHLORIDE 0.9 % IV SOLN
INTRAVENOUS | Status: DC
  Administered 2019-06-29: 04:00:00 via INTRAVENOUS

## 2019-06-28 NOTE — Consult Note (Addendum)
Chief Complaint   Chief Complaint  Patient presents with  . Fall  . Hip Pain    HPI   Consult requested by: Dr Ralene Bathe Reason for consult: L1-L4 TP fracture  HPI: William Jordan is a 73 y.o. male with multiple comorbidities including chronic pain, neuropathy and chronic weakness who presented after a mechanical fall at home. No head injury, no LOC. He underwent work up by EDP and found to have multiple injuries including right L1-L4 TP fractures and right sided retroperitoneal hemorrhage. NSY consultation requested due to TP fractures. Currently complains of severe midline and right sided lower back pain. Pain radiates into right hip. Endorses weakness with bilateral HF, R>L due to severe lower back pain. Denies N/T. Has urinated since being in the ED.   Patient Active Problem List   Diagnosis Date Noted  . Mild cognitive impairment   . Acute encephalopathy 11/06/2018  . Left leg swelling 05/16/2018  . Alcohol abuse 05/16/2018  . Depression 05/16/2018  . Toxic encephalopathy   . Marijuana abuse   . Encephalopathy 05/15/2018  . Gait disturbance, post-stroke   . History of CVA (cerebrovascular accident) 05/04/2018  . Spastic hemiparesis of left nondominant side due to acute cerebral infarction ([place])   . 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[place])   . Essential hypertension   . Hyponatremia   . Non-ST elevation (NSTEMI) myocardial infarction ([place])   . Ventricular tachycardia ([place])   . CVA (cerebral vascular accident) ([place]) 04/25/2018  . Angioedema 04/18/2018  . History of right inguinal hernia repair 02/10/2018  . Acute kidney injury ([place]) 12/10/2017  . Urinary retention 04/25/2016  . Acute renal failure syndrome ([place])   . Low back pain with sciatica 04/03/2015  . H/O total hip arthroplasty 04/03/2015  . Idiopathic peripheral neuropathy 09/29/2014  . Chronic pain 09/28/2014  . DDD (degenerative disc disease), lumbar 12/30/2012  . Degenerative arthritis of thoracic spine 12/30/2012  . Localized  osteoarthrosis 12/13/2012  . Chronic pain associated with significant psychosocial dysfunction 12/13/2012  . Polypharmacy 12/13/2012  . Long term current use of opiate analgesic 12/13/2012    PMH: Past Medical History:  Diagnosis Date  . Angioedema    from Benazepril reaction  . Anxiety    takes Celexa  . Arthritis   . Chronic back pain   . CVA (cerebral vascular accident) ([place])   . Enlarged prostate   . History of blood transfusion   . Hypertension   . MI (myocardial infarction) ([place])   . Neuropathy     PSH: Past Surgical History:  Procedure Laterality Date  . CARDIAC CATHETERIZATION  05/03/2018  . COLONOSCOPY     one polyp removed - benign  . ELBOW SURGERY Right   . HERNIA REPAIR     umbilical  . JOINT REPLACEMENT Left    knee, x 2  . JOINT REPLACEMENT Right    hip  . JOINT REPLACEMENT Left    partial shoulder  . LEFT HEART CATH AND CORONARY ANGIOGRAPHY N/A 05/03/2018   Procedure: LEFT HEART CATH AND CORONARY ANGIOGRAPHY;  Surgeon: Martinique, Peter M, MD;  Location: [facility];  Service: Cardiovascular;  Laterality: N/A;  . LOOP RECORDER INSERTION N/A 04/27/2018   Procedure: LOOP RECORDER INSERTION;  Surgeon: Evans Lance, MD;  Location: [facility];  Service: Cardiovascular;  Laterality: N/A;  . SPINAL CORD STIMULATOR INSERTION N/A 09/28/2014   Procedure: LUMBAR SPINAL CORD STIMULATOR INSERTION;  Surgeon: Melina Schools, MD;  Location: [facility];  Service: Orthopedics;  Laterality: N/A;  . TEE WITHOUT CARDIOVERSION N/A 04/27/2018   Procedure: TRANSESOPHAGEAL ECHOCARDIOGRAM (TEE);  Surgeon: Larey Dresser, MD;  Location: [facility];  Service: Cardiovascular;  Laterality: N/A;  . TONSILLECTOMY    . VASECTOMY      (Not in a hospital admission)   SH: Social History   Tobacco Use  . Smoking status: Current Every Day Smoker    Types: E-cigarettes, Cigars  . Smokeless tobacco: Former Systems developer    Types: Snuff  . Tobacco comment: does not use any tobacco  products any more  Substance Use Topics  . Alcohol use: Yes    Comment: occ  . Drug use: No    MEDS: Prior to Admission medications   Medication Sig Start Date End Date Taking? Authorizing Provider  acetaminophen (TYLENOL) 325 MG tablet Take 2 tablets (650 mg total) by mouth every 4 (four) hours as needed for headache or mild pain. 05/06/18   Angiulli, Lavon Paganini, PA-C  amLODipine (NORVASC) 10 MG tablet Take 10 mg by mouth daily.    [provider]  aspirin 325 MG tablet Take 1 tablet (325 mg total) by mouth daily. 04/29/18   Asencion Noble, MD  atorvastatin (LIPITOR) 40 MG tablet Take 1 tablet (40 mg total) by mouth daily at 6 PM. 05/06/18   Angiulli, Lavon Paganini, PA-C  cetirizine (ZYRTEC) 10 MG tablet Take 1 tablet (10 mg total) by mouth daily. 05/17/18   Seawell, Jaimie A, DO  cholecalciferol (VITAMIN D) 1000 units tablet Take 2,000 Units by mouth daily.    [provider]  citalopram (CELEXA) 20 MG tablet Take 1 tablet (20 mg total) by mouth daily. 05/18/18   Seawell, Jaimie A, DO  clopidogrel (PLAVIX) 75 MG tablet Take 1 tablet (75 mg total) by mouth daily. 05/06/18   Angiulli, Lavon Paganini, PA-C  colchicine 0.6 MG tablet Take 0.6 mg by mouth 3 (three) times daily as needed (for gout flares).     [provider]  divalproex (DEPAKOTE) 500 MG DR tablet Take 1 tablet (500 mg total) by mouth every 12 (twelve) hours. 05/17/18   Seawell, Jaimie A, DO  gabapentin (NEURONTIN) 600 MG tablet Take 0.5 tablets (300 mg total) by mouth 3 (three) times daily. Patient taking differently: Take 600 mg by mouth 3 (three) times daily.  05/06/18   Angiulli, Lavon Paganini, PA-C  magnesium gluconate (MAGONATE) 500 MG tablet Take 500 mg by mouth 2 (two) times daily.    [provider]  omeprazole (PRILOSEC) 20 MG capsule Take 20 mg by mouth daily. 07/13/18   [provider]  Oxycodone HCl 20 MG TABS Take 1 tablet (20 mg total) by mouth every 8 (eight) hours as needed. Patient taking  differently: Take 20 mg by mouth See admin instructions. Take 20 mg by mouth every 4-6 hours as needed for pain 12/12/17   Roxan Hockey, MD  vitamin B-12 (CYANOCOBALAMIN) 100 MCG tablet Take 1 tablet (100 mcg total) by mouth daily. 08/17/15   Caren Griffins, MD    ALLERGY: Allergies  Allergen Reactions  . Ace Inhibitors Shortness Of Breath, Swelling and Other (See Comments)    Angioedema (throat and tongue became swollen)  . Morphine Other (See Comments)    Causes confusion and "makes me crazy," per the patient  . Sulfa Antibiotics Other (See Comments)    Unknown reaction, from childhood    Social History   Tobacco Use  . Smoking status: Current Every Day Smoker    Types:  E-cigarettes, Cigars  . Smokeless tobacco: Former Systems developer    Types: Snuff  . Tobacco comment: does not use any tobacco products any more  Substance Use Topics  . Alcohol use: Yes    Comment: occ     Family History  Problem Relation Age of Onset  . Heart disease Father      ROS   Review of Systems  Constitutional: Negative.   HENT: Negative.   Eyes: Negative.   Respiratory: Negative.   Cardiovascular: Negative.   Gastrointestinal: Negative.   Musculoskeletal: Positive for back pain, falls, joint pain and myalgias. Negative for neck pain.  Skin: Negative.   Neurological: Positive for weakness. Negative for dizziness, tingling, tremors, sensory change, speech change, focal weakness, seizures, loss of consciousness and headaches.    Exam   Vitals:   06/28/19 1439 06/28/19 2222  BP:  (!) 138/99  Pulse:  99  Resp:  18  Temp:  98.7 F (37.1 C)  SpO2: 98% 99%   General appearance: WDWN, NAD Eyes: No scleral injection Cardiovascular: Regular rate and rhythm without murmurs, rubs, gallops. No edema or variciosities. Distal pulses normal. Pulmonary: Effort normal, non-labored breathing Musculoskeletal:     Muscle tone upper extremities: Normal    Muscle tone lower extremities: Normal    Motor  exam: Upper Extremities Deltoid Bicep Tricep Grip  Right 5/5 5/5 5/5 5/5  Left 5/5 5/5 5/5 5/5   Lower Extremity IP Quad PF DF EHL  Right 4-/5 4+/5 5/5 5/5 5/5  Left 4+/5 4+/5 5/5 5/5 5/5   Neurological Mental Status:    - Patient is awake, alert, oriented to person, place, month, year, and situation    - Patient is able to give a clear and coherent history.    - No signs of aphasia or neglect Cranial Nerves    - II: Visual Fields are full. PERRL    - III/IV/VI: EOMI without ptosis or diploplia.     - V: Facial sensation is grossly normal    - VII: Facial movement is symmetric.     - VIII: hearing is intact to voice    - X: Uvula elevates symmetrically    - XI: Shoulder shrug is symmetric.    - XII: tongue is midline without atrophy or fasciculations.  Sensory: Sensation grossly intact to LT Deep Tendon Reflexes    - 2+ and symmetric in the biceps and patellae.  Plantars   - Toes are downgoing bilaterally.   Results - Imaging/Labs   Results for orders placed or performed during the hospital encounter of 06/28/19 (from the past 48 hour(s))  Comprehensive metabolic panel     Status: Abnormal   Collection Time: 06/28/19  7:44 PM  Result Value Ref Range   Sodium 131 (L) 135 - 145 mmol/L   Potassium 3.5 3.5 - 5.1 mmol/L   Chloride 97 (L) 98 - 111 mmol/L   CO2 19 (L) 22 - 32 mmol/L   Glucose, Bld 165 (H) 70 - 99 mg/dL   BUN 16 8 - 23 mg/dL   Creatinine, Ser 0.93 0.61 - 1.24 mg/dL   Calcium 9.2 8.9 - 10.3 mg/dL   Total Protein 7.1 6.5 - 8.1 g/dL   Albumin 4.1 3.5 - 5.0 g/dL   AST 27 15 - 41 U/L   ALT 18 0 - 44 U/L   Alkaline Phosphatase 65 38 - 126 U/L   Total Bilirubin 0.7 0.3 - 1.2 mg/dL   GFR calc non Af Amer >60 >60  mL/min   GFR calc Af Amer >60 >60 mL/min   Anion gap 15 5 - 15    Comment: Performed at [facility] [address], [place], [place] [address]  CBC with Differential     Status: Abnormal   Collection Time: 06/28/19  7:44 PM  Result Value Ref  Range   WBC 16.4 (H) 4.0 - 10.5 K/uL   RBC 4.23 4.22 - 5.81 MIL/uL   Hemoglobin 13.4 13.0 - 17.0 g/dL   HCT 37.9 (L) 39.0 - 52.0 %   MCV 89.6 80.0 - 100.0 fL   MCH 31.7 26.0 - 34.0 pg   MCHC 35.4 30.0 - 36.0 g/dL   RDW 12.9 11.5 - 15.5 %   Platelets 280 150 - 400 K/uL   nRBC 0.0 0.0 - 0.2 %   Neutrophils Relative % 89 %   Neutro Abs 14.6 (H) 1.7 - 7.7 K/uL   Lymphocytes Relative 5 %   Lymphs Abs 0.7 0.7 - 4.0 K/uL   Monocytes Relative 5 %   Monocytes Absolute 0.8 0.1 - 1.0 K/uL   Eosinophils Relative 0 %   Eosinophils Absolute 0.1 0.0 - 0.5 K/uL   Basophils Relative 0 %   Basophils Absolute 0.0 0.0 - 0.1 K/uL   Immature Granulocytes 1 %   Abs Immature Granulocytes 0.19 (H) 0.00 - 0.07 K/uL    Comment: Performed at [facility] [address] [place], [place] [address]  Protime-INR     Status: None   Collection Time: 06/28/19 10:14 PM  Result Value Ref Range   Prothrombin Time 14.2 11.4 - 15.2 seconds   INR 1.1 0.8 - 1.2    Comment: (NOTE) INR goal varies based on device and disease states. Performed at [facility], [place] [address] [place], [place] [address]   Type and screen [place]     Status: None (Preliminary result)   Collection Time: 06/28/19 10:14 PM  Result Value Ref Range   ABO/RH(D) PENDING    Antibody Screen PENDING    Sample Expiration      07/01/2019,2359 Performed at [facility], [place] [address] [place],  [address]     Ct Abdomen Pelvis W Contrast  Result Date: 06/28/2019 CLINICAL DATA:  [place] with weakness and abdominal pain. EXAM: CT ABDOMEN AND PELVIS WITH CONTRAST TECHNIQUE: Multidetector CT imaging of the abdomen and pelvis was performed using the standard protocol following bolus administration of intravenous contrast. CONTRAST:  117mL OMNIPAQUE IOHEXOL 300 MG/ML  SOLN COMPARISON:  12/10/2017 FINDINGS: Lower chest: Lung bases are clear.  No pleural or pericardial fluid. Hepatobiliary: Normal Pancreas:  Normal Spleen: Normal Adrenals/Urinary Tract: Adrenal glands are normal. There is enlargement of a solid mass in the lower pole of the right kidney measuring almost 5 cm in diameter, enlarged since last year's examination and highly likely to represent a renal cell carcinoma. Simple cyst noted of the left kidney. Bladder is normal. Stomach/Bowel: No acute or primary bowel pathology. Vascular/Lymphatic: Aortic atherosclerosis. No aneurysm. IVC is normal. Severe stenosis of the right common iliac artery with near occlusion. No retroperitoneal adenopathy. Reproductive: Normal Other: Right-sided retroperitoneal hematoma both within the retroperitoneal fat and the iliopsoas musculature. This is likely a result the right-sided transverse process fractures from L1 through L4. No puddle sign to suggest ongoing acute rapid extravasation. Musculoskeletal: Right transverse process fractures from L1 through L4 as noted above, responsible for the retroperitoneal hemorrhage on the right. Advanced chronic  degenerative changes of the spine with chronic loss of height of the lower thoracic and upper lumbar vertebral bodies. IMPRESSION: Transverse process fractures on the right from L1 through L4 with moderate to large right-sided retroperitoneal hemorrhage both within the retroperitoneal fat and within the iliopsoas musculature. Enlarging 5 cm mass in the lower pole of the right kidney likely to represent renal cell carcinoma. Atherosclerosis of the aorta and its branch vessels, including near occlusion of the right common iliac artery. Electronically Signed   By: Nelson Chimes M.D.   On: 06/28/2019 21:33   Dg Hip Unilat With Pelvis 2-3 Views Right  Result Date: 06/28/2019 CLINICAL DATA:  Right hip pain. Previous right total hip arthroplasty. EXAM: DG HIP (WITH OR WITHOUT PELVIS) 2-3V RIGHT COMPARISON:  CT scan of the abdomen and pelvis dated 12/10/2017 FINDINGS: There is no evidence of fracture or bone destruction. The  components of the right total hip prosthesis appear in good position with no evidence of loosening. No acute abnormality of the pelvic bones. Neurostimulator generator overlies the right iliac bone. Aortic atherosclerosis. IMPRESSION: 1. No acute abnormalities of the pelvis or right hip. 2. Aortic atherosclerosis. Electronically Signed   By: Lorriane Shire M.D.   On: 06/28/2019 15:24   Impression/Plan   73 y.o. male with right L1, L2, L3 and L4 transverse process fractures and subsequent right sided retroperitoneal hemorrhage after a mechanical fall. With the exception of pain mediated weakness in proximal BLE, R>L, he is neurologically intact. No role for NS intervention for these fractures. I have ordered him a lumbar corset for symptomatic relief, although optional to wear. No NS f/u needed. Patient to be admitted under trauma for monitoring due to retroperitoneal hemorrhage. Please call for any concerns.  Ferne Reus, PA-C [facility] [employer]

## 2019-06-28 NOTE — ED Triage Notes (Signed)
Pt arrives EMS [facility] after falling getting off B[facility] transferring to chair , pt states fell on rt hip that now hurts , pt was able to stand and  Get into w/c from strt no shortening or roattion has been requesting pain meds per ems  Pt is on a lot of pain meds.

## 2019-06-28 NOTE — ED Provider Notes (Signed)
[facility] EMERGENCY DEPARTMENT Provider Note   CSN: [id number] Arrival date & time: 06/28/19  1428     History   Chief Complaint Chief Complaint  Patient presents with   Fall   Hip Pain    HPI William Jordan is a 73 y.o. male.     The history is provided by the patient and medical records. No language interpreter was used.  Fall  Hip Pain   William Jordan is a 73 y.o. male who presents to the Emergency Department complaining of fall, hip pain. He presents to the emergency department for evaluation of injuries following a fall that occurred earlier today. He states that he was sitting on a stool and his feet slipped out from underneath him and he fell and landed on his tailbone. He complains of severe pain in the right hip that is worse with movement. He states the pain radiates down to his right knee. He did hit his head but denies any loss of consciousness. He denies any recent illnesses. He lives at home with his wife. He denies any tobacco, alcohol, drug use. He does have a history of neuropathy in frequent falls. He uses a walker at home to ambulate.  Past Medical History:  Diagnosis Date   Angioedema    from Benazepril reaction   Anxiety    takes Celexa   Arthritis    Chronic back pain    CVA (cerebral vascular accident) ([place])    Enlarged prostate    History of blood transfusion    Hypertension    MI (myocardial infarction) ([place])    Neuropathy     Patient Active Problem List   Diagnosis Date Noted   Traumatic retroperitoneal hematoma 06/28/2019   Mild cognitive impairment    Acute encephalopathy 11/06/2018   Left leg swelling 05/16/2018   Alcohol abuse 05/16/2018   Depression 05/16/2018   Toxic encephalopathy    Marijuana abuse    Encephalopathy 05/15/2018   Gait disturbance, post-stroke    History of CVA (cerebrovascular accident) 05/04/2018   Spastic hemiparesis of left nondominant side due to acute cerebral  infarction [facility])    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[place])    Essential hypertension    Hyponatremia    Non-ST elevation (NSTEMI) myocardial infarction [facility])    Ventricular tachycardia ([place])    CVA (cerebral vascular accident) ([place]) 04/25/2018   Angioedema 04/18/2018   History of right inguinal hernia repair 02/10/2018   Acute kidney injury ([place]) 12/10/2017   Urinary retention 04/25/2016   Acute renal failure syndrome ([facility])    Low back pain with sciatica 04/03/2015   H/O total hip arthroplasty 04/03/2015   Idiopathic peripheral neuropathy 09/29/2014   Chronic pain 09/28/2014   DDD (degenerative disc disease), lumbar 12/30/2012   Degenerative arthritis of thoracic spine 12/30/2012   Localized osteoarthrosis 12/13/2012   Chronic pain associated with significant psychosocial dysfunction 12/13/2012   Polypharmacy 12/13/2012   Long term current use of opiate analgesic 12/13/2012    Past Surgical History:  Procedure Laterality Date   CARDIAC CATHETERIZATION  05/03/2018   COLONOSCOPY     one polyp removed - benign   ELBOW SURGERY Right    HERNIA REPAIR     umbilical   JOINT REPLACEMENT Left    knee, x 2   JOINT REPLACEMENT Right    hip   JOINT REPLACEMENT Left    partial shoulder   LEFT HEART CATH AND CORONARY ANGIOGRAPHY N/A 05/03/2018   Procedure: LEFT HEART  CATH AND CORONARY ANGIOGRAPHY;  Surgeon: Martinique, Peter M, MD;  Location: [facility];  Service: Cardiovascular;  Laterality: N/A;   LOOP RECORDER INSERTION N/A 04/27/2018   Procedure: LOOP RECORDER INSERTION;  Surgeon: Evans Lance, MD;  Location: [facility];  Service: Cardiovascular;  Laterality: N/A;   SPINAL CORD STIMULATOR INSERTION N/A 09/28/2014   Procedure: LUMBAR SPINAL CORD STIMULATOR INSERTION;  Surgeon: Melina Schools, MD;  Location: [facility];  Service: Orthopedics;  Laterality: N/A;   TEE WITHOUT CARDIOVERSION N/A 04/27/2018   Procedure: TRANSESOPHAGEAL ECHOCARDIOGRAM (TEE);   Surgeon: Larey Dresser, MD;  Location: [facility] ENDOSCOPY;  Service: Cardiovascular;  Laterality: N/A;   TONSILLECTOMY     VASECTOMY          Home Medications    Prior to Admission medications   Medication Sig Start Date End Date Taking? Authorizing Provider  acetaminophen (TYLENOL) 325 MG tablet Take 2 tablets (650 mg total) by mouth every 4 (four) hours as needed for headache or mild pain. 05/06/18   Angiulli, Lavon Paganini, PA-C  amLODipine (NORVASC) 10 MG tablet Take 10 mg by mouth daily.    [provider]  aspirin 325 MG tablet Take 1 tablet (325 mg total) by mouth daily. 04/29/18   Asencion Noble, MD  atorvastatin (LIPITOR) 40 MG tablet Take 1 tablet (40 mg total) by mouth daily at 6 PM. 05/06/18   Angiulli, Lavon Paganini, PA-C  cetirizine (ZYRTEC) 10 MG tablet Take 1 tablet (10 mg total) by mouth daily. 05/17/18   Seawell, Jaimie A, DO  cholecalciferol (VITAMIN D) 1000 units tablet Take 2,000 Units by mouth daily.    [provider]  citalopram (CELEXA) 20 MG tablet Take 1 tablet (20 mg total) by mouth daily. 05/18/18   Seawell, Jaimie A, DO  clopidogrel (PLAVIX) 75 MG tablet Take 1 tablet (75 mg total) by mouth daily. 05/06/18   Angiulli, Lavon Paganini, PA-C  colchicine 0.6 MG tablet Take 0.6 mg by mouth 3 (three) times daily as needed (for gout flares).     [provider]  divalproex (DEPAKOTE) 500 MG DR tablet Take 1 tablet (500 mg total) by mouth every 12 (twelve) hours. 05/17/18   Seawell, Jaimie A, DO  gabapentin (NEURONTIN) 600 MG tablet Take 0.5 tablets (300 mg total) by mouth 3 (three) times daily. Patient taking differently: Take 600 mg by mouth 3 (three) times daily.  05/06/18   Angiulli, Lavon Paganini, PA-C  magnesium gluconate (MAGONATE) 500 MG tablet Take 500 mg by mouth 2 (two) times daily.    [provider]  omeprazole (PRILOSEC) 20 MG capsule Take 20 mg by mouth daily. 07/13/18   [provider]  Oxycodone HCl 20 MG TABS Take 1 tablet (20 mg  total) by mouth every 8 (eight) hours as needed. Patient taking differently: Take 20 mg by mouth See admin instructions. Take 20 mg by mouth every 4-6 hours as needed for pain 12/12/17   Roxan Hockey, MD  vitamin B-12 (CYANOCOBALAMIN) 100 MCG tablet Take 1 tablet (100 mcg total) by mouth daily. 08/17/15   Caren Griffins, MD    Family History Family History  Problem Relation Age of Onset   Heart disease Father     Social History Social History   Tobacco Use   Smoking status: Current Every Day Smoker    Types: E-cigarettes, Cigars   Smokeless tobacco: Former Systems developer    Types: Snuff   Tobacco comment: does not use any tobacco products  any more  Substance Use Topics   Alcohol use: Yes    Comment: occ   Drug use: No     Allergies   Ace inhibitors, Morphine, and Sulfa antibiotics   Review of Systems Review of Systems  All other systems reviewed and are negative.    Physical Exam Updated Vital Signs BP (!) 138/99 (BP Location: Right Arm)    Pulse 99    Temp 98.7 F (37.1 C) (Oral)    Resp 18    SpO2 99%   Physical Exam Vitals signs and nursing note reviewed.  Constitutional:      Appearance: He is well-developed.  HENT:     Head: Normocephalic and atraumatic.  Cardiovascular:     Rate and Rhythm: Regular rhythm. Tachycardia present.  Pulmonary:     Effort: Pulmonary effort is normal. No respiratory distress.  Abdominal:     Palpations: Abdomen is soft.     Tenderness: There is no guarding or rebound.     Comments: Moderate right sided abdominal tenderness without any ecchymosis.  Musculoskeletal:     Comments: No midline thoracic or lumbar tenderness to palpation. No CVA tenderness. There is tenderness to palpation over the right inguinal crease. Flexion extension is intact at the hip. 2+ DP pulses bilaterally.  Skin:    General: Skin is warm and dry.  Neurological:     Mental Status: He is alert and oriented to person, place, and time.     Comments:  Five out of five strength in bilateral lower extremities.  Psychiatric:        Behavior: Behavior normal.      ED Treatments / Results  Labs (all labs ordered are listed, but only abnormal results are displayed) Labs Reviewed  COMPREHENSIVE METABOLIC PANEL - Abnormal; Notable for the following components:      Result Value   Sodium 131 (*)    Chloride 97 (*)    CO2 19 (*)    Glucose, Bld 165 (*)    All other components within normal limits  CBC WITH DIFFERENTIAL/PLATELET - Abnormal; Notable for the following components:   WBC 16.4 (*)    HCT 37.9 (*)    Neutro Abs 14.6 (*)    Abs Immature Granulocytes 0.19 (*)    All other components within normal limits  URINALYSIS, ROUTINE W REFLEX MICROSCOPIC - Abnormal; Notable for the following components:   Specific Gravity, Urine >1.046 (*)    Ketones, ur 5 (*)    All other components within normal limits  SARS CORONAVIRUS 2 (TAT 6-24 HRS)  PROTIME-INR  CBC  BASIC METABOLIC PANEL  TYPE AND SCREEN    EKG None  Radiology Ct Abdomen Pelvis W Contrast  Result Date: 06/28/2019 CLINICAL DATA:  [place] with weakness and abdominal pain. EXAM: CT ABDOMEN AND PELVIS WITH CONTRAST TECHNIQUE: Multidetector CT imaging of the abdomen and pelvis was performed using the standard protocol following bolus administration of intravenous contrast. CONTRAST:  182mL OMNIPAQUE IOHEXOL 300 MG/ML  SOLN COMPARISON:  12/10/2017 FINDINGS: Lower chest: Lung bases are clear.  No pleural or pericardial fluid. Hepatobiliary: Normal Pancreas: Normal Spleen: Normal Adrenals/Urinary Tract: Adrenal glands are normal. There is enlargement of a solid mass in the lower pole of the right kidney measuring almost 5 cm in diameter, enlarged since last year's examination and highly likely to represent a renal cell carcinoma. Simple cyst noted of the left kidney. Bladder is normal. Stomach/Bowel: No acute or primary bowel pathology. Vascular/Lymphatic: Aortic atherosclerosis. No  aneurysm.  IVC is normal. Severe stenosis of the right common iliac artery with near occlusion. No retroperitoneal adenopathy. Reproductive: Normal Other: Right-sided retroperitoneal hematoma both within the retroperitoneal fat and the iliopsoas musculature. This is likely a result the right-sided transverse process fractures from L1 through L4. No puddle sign to suggest ongoing acute rapid extravasation. Musculoskeletal: Right transverse process fractures from L1 through L4 as noted above, responsible for the retroperitoneal hemorrhage on the right. Advanced chronic degenerative changes of the spine with chronic loss of height of the lower thoracic and upper lumbar vertebral bodies. IMPRESSION: Transverse process fractures on the right from L1 through L4 with moderate to large right-sided retroperitoneal hemorrhage both within the retroperitoneal fat and within the iliopsoas musculature. Enlarging 5 cm mass in the lower pole of the right kidney likely to represent renal cell carcinoma. Atherosclerosis of the aorta and its branch vessels, including near occlusion of the right common iliac artery. Electronically Signed   By: Nelson Chimes M.D.   On: 06/28/2019 21:33   Dg Chest Port 1 View  Result Date: 06/28/2019 CLINICAL DATA:  73 year old male status post trip and fall on back yesterday. EXAM: PORTABLE CHEST 1 VIEW COMPARISON:  Chest radiographs 11/05/2018 and earlier. FINDINGS: Portable AP semi upright view at 2343 hours. Chronically low lung volumes, improved today. Chronic left chest cardiac event recorder and thoracic spinal stimulator. Chronic left shoulder arthroplasty. Allowing for portable technique the lungs are clear. Mediastinal contours and tracheal air column within normal limits. Negative visible bowel gas pattern. Chronic right clavicle deformity. Possible chronic posterior left 5th and 6th rib fractures appear stable. No acute osseous abnormality identified. IMPRESSION: No acute cardiopulmonary  abnormality or acute traumatic injury identified. Electronically Signed   By: Genevie Ann M.D.   On: 06/28/2019 23:54   Dg Hip Unilat With Pelvis 2-3 Views Right  Result Date: 06/28/2019 CLINICAL DATA:  Right hip pain. Previous right total hip arthroplasty. EXAM: DG HIP (WITH OR WITHOUT PELVIS) 2-3V RIGHT COMPARISON:  CT scan of the abdomen and pelvis dated 12/10/2017 FINDINGS: There is no evidence of fracture or bone destruction. The components of the right total hip prosthesis appear in good position with no evidence of loosening. No acute abnormality of the pelvic bones. Neurostimulator generator overlies the right iliac bone. Aortic atherosclerosis. IMPRESSION: 1. No acute abnormalities of the pelvis or right hip. 2. Aortic atherosclerosis. Electronically Signed   By: Lorriane Shire M.D.   On: 06/28/2019 15:24    Procedures Procedures (including critical care time) CRITICAL CARE Performed by: Quintella Reichert   Total critical care time: 35 minutes  Critical care time was exclusive of separately billable procedures and treating other patients.  Critical care was necessary to treat or prevent imminent or life-threatening deterioration.  Critical care was time spent personally by me on the following activities: development of treatment plan with patient and/or surrogate as well as nursing, discussions with consultants, evaluation of patient's response to treatment, examination of patient, obtaining history from patient or surrogate, ordering and performing treatments and interventions, ordering and review of laboratory studies, ordering and review of radiographic studies, pulse oximetry and re-evaluation of patient's condition.  Medications Ordered in ED Medications  0.9 %  sodium chloride infusion (has no administration in time range)  metoprolol tartrate (LOPRESSOR) injection 5 mg (has no administration in time range)  hydrALAZINE (APRESOLINE) injection 10 mg (has no administration in time  range)  pantoprazole (PROTONIX) EC tablet 40 mg (has no administration in time range)  Or  pantoprazole (PROTONIX) injection 40 mg (has no administration in time range)  ondansetron (ZOFRAN-ODT) disintegrating tablet 4 mg (has no administration in time range)    Or  ondansetron (ZOFRAN) injection 4 mg (has no administration in time range)  docusate sodium (COLACE) capsule 100 mg (has no administration in time range)  bisacodyl (DULCOLAX) suppository 10 mg (has no administration in time range)  HYDROmorphone (DILAUDID) injection 0.5 mg (0.5 mg Intravenous Given 06/28/19 2335)  oxyCODONE (Oxy IR/ROXICODONE) immediate release tablet 10 mg (has no administration in time range)  oxyCODONE (Oxy IR/ROXICODONE) immediate release tablet 5 mg (has no administration in time range)  acetaminophen (TYLENOL) tablet 650 mg (has no administration in time range)  amLODipine (NORVASC) tablet 10 mg (has no administration in time range)  citalopram (CELEXA) tablet 20 mg (has no administration in time range)  colchicine tablet 0.6 mg (has no administration in time range)  gabapentin (NEURONTIN) tablet 300 mg (has no administration in time range)  magnesium oxide (MAG-OX) tablet 400 mg (has no administration in time range)  oxyCODONE (Oxy IR/ROXICODONE) immediate release tablet 10 mg (10 mg Oral Given 06/28/19 2026)  iohexol (OMNIPAQUE) 300 MG/ML solution 100 mL (100 mLs Intravenous Contrast Given 06/28/19 2059)  oxyCODONE (Oxy IR/ROXICODONE) immediate release tablet 10 mg (10 mg Oral Given 06/28/19 2142)  fentaNYL (SUBLIMAZE) injection 50 mcg (50 mcg Intravenous Given 06/28/19 2222)     Initial Impression / Assessment and Plan / ED Course  I have reviewed the triage vital signs and the nursing notes.  Pertinent labs & imaging results that were available during my care of the patient were reviewed by me and considered in my medical decision making (see chart for details).        Patient here for  evaluation of right groin pain following a fall that occurred earlier today. He does have significant right sided abdominal tenderness as well as right hip tenderness. He has no significant midline back tenderness. He is well perfused on examination. Given his abdominal tenderness CT abdomen pelvis was obtained. CT scan is significant for L1 through L4 transverse process fractures as well as retroperitoneal hematoma. Patient does have tachycardia but no hypotension during his ED stay. Hemoglobin is within normal limits. He is on aspirin and Plavix. Will send type and screen. Discussed with patient findings of studies and recommendation for admission and he is in agreement with treatment plan. Also discussed with patient incidental finding of renal mass, concerning for possible renal cell carcinoma. Dr. Kae Heller with trauma surgery consulted, will see the patient with plan to admit. Discussed with neurosurgery, he will be seen and consult.  Final Clinical Impressions(s) / ED Diagnoses   Final diagnoses:  Traumatic retroperitoneal hematoma, initial encounter  Other closed fracture of first lumbar vertebra, initial encounter [facility])  Fall, initial encounter    ED Discharge Orders    None       Quintella Reichert, MD 06/29/19 [phone]

## 2019-06-28 NOTE — H&P (Signed)
Surgical H&P  CC: fall  HPI: 73 year old gentleman with numerous medical problems which include chronic pain, weakness and neuropathy which has left him with a tendency for frequent falls despite use of a walker.  He had his "worst fall "yet today, when he tripped over a piece of furniture in the living room and fell on his back.  He complains of pain everywhere, and on presentation noted the worst pain in the right hip and groin with radiation to his knee, however he currently is describing right-sided back pain.  He states that he did hit his head but denies loss of consciousness or ongoing headache or pain.  Evaluation includes plain films and a CT of the abdomen and pelvis, and he is noted to have L1-4 transverse process fractures with associated right retroperitoneal hematoma.  Incidental finding includes likely right renal cell carcinoma, and atherosclerosis of the aorta and branch vessels including near occlusion of the right common iliac artery.  Chest x-ray is pending. He does take aspirin and Plavix due to history of CVA and MI.  He arrived to the emergency department around 2:30 this afternoon, and has been hemodynamically stable throughout his time here.  Labs drawn around 8 PM demonstrate a hemoglobin of 13.4 which appears to be near his baseline.   Allergies  Allergen Reactions  . Ace Inhibitors Shortness Of Breath, Swelling and Other (See Comments)    Angioedema (throat and tongue became swollen)  . Morphine Other (See Comments)    Causes confusion and "makes me crazy," per the patient  . Sulfa Antibiotics Other (See Comments)    Unknown reaction, from childhood    Past Medical History:  Diagnosis Date  . Angioedema    from Benazepril reaction  . Anxiety    takes Celexa  . Arthritis   . Chronic back pain   . CVA (cerebral vascular accident) ([place])   . Enlarged prostate   . History of blood transfusion   . Hypertension   . MI (myocardial infarction) ([place])   . Neuropathy      Past Surgical History:  Procedure Laterality Date  . CARDIAC CATHETERIZATION  05/03/2018  . COLONOSCOPY     one polyp removed - benign  . ELBOW SURGERY Right   . HERNIA REPAIR     umbilical  . JOINT REPLACEMENT Left    knee, x 2  . JOINT REPLACEMENT Right    hip  . JOINT REPLACEMENT Left    partial shoulder  . LEFT HEART CATH AND CORONARY ANGIOGRAPHY N/A 05/03/2018   Procedure: LEFT HEART CATH AND CORONARY ANGIOGRAPHY;  Surgeon: Martinique, Peter M, MD;  Location: [facility];  Service: Cardiovascular;  Laterality: N/A;  . LOOP RECORDER INSERTION N/A 04/27/2018   Procedure: LOOP RECORDER INSERTION;  Surgeon: Evans Lance, MD;  Location: [facility];  Service: Cardiovascular;  Laterality: N/A;  . SPINAL CORD STIMULATOR INSERTION N/A 09/28/2014   Procedure: LUMBAR SPINAL CORD STIMULATOR INSERTION;  Surgeon: Melina Schools, MD;  Location: [facility];  Service: Orthopedics;  Laterality: N/A;  . TEE WITHOUT CARDIOVERSION N/A 04/27/2018   Procedure: TRANSESOPHAGEAL ECHOCARDIOGRAM (TEE);  Surgeon: Larey Dresser, MD;  Location: [facility];  Service: Cardiovascular;  Laterality: N/A;  . TONSILLECTOMY    . VASECTOMY      Family History  Problem Relation Age of Onset  . Heart disease Father     Social History   Socioeconomic History  . Marital status: Married    Spouse name: Not  on file  . Number of children: Not on file  . Years of education: Not on file  . Highest education level: Not on file  Occupational History  . Not on file  Social Needs  . Financial resource strain: Not on file  . Food insecurity    Worry: Not on file    Inability: Not on file  . Transportation needs    Medical: Not on file    Non-medical: Not on file  Tobacco Use  . Smoking status: Current Every Day Smoker    Types: E-cigarettes, Cigars  . Smokeless tobacco: Former Systems developer    Types: Snuff  . Tobacco comment: does not use any tobacco products any more  Substance and Sexual Activity  .  Alcohol use: Yes    Comment: occ  . Drug use: No  . Sexual activity: Never  Lifestyle  . Physical activity    Days per week: Not on file    Minutes per session: Not on file  . Stress: Not on file  Relationships  . Social Herbalist on phone: Not on file    Gets together: Not on file    Attends religious service: Not on file    Active member of club or organization: Not on file    Attends meetings of clubs or organizations: Not on file    Relationship status: Not on file  Other Topics Concern  . Not on file  Social History Narrative  . Not on file    Current Facility-Administered Medications on File Prior to Encounter  Medication Dose Route Frequency Provider Last Rate Last Dose  . fentaNYL (SUBLIMAZE) injection    PRN Martinique, Peter M, MD   25 mcg at 05/03/18 1336  . Heparin (Porcine) in NaCl 1000-0.9 UT/500ML-% SOLN    PRN Martinique, Peter M, MD   500 mL at 05/03/18 1331  . Heparin (Porcine) in NaCl 1000-0.9 UT/500ML-% SOLN    PRN Martinique, Peter M, MD   500 mL at 05/03/18 1332  . heparin injection    PRN Martinique, Peter M, MD   4,000 Units at 05/03/18 1345  . iopamidol (ISOVUE-370) 76 % injection    PRN Martinique, Peter M, MD   85 mL at 05/03/18 1357  . lidocaine (PF) (XYLOCAINE) 1 % injection    PRN Martinique, Peter M, MD   2 mL at 05/03/18 1342  . midazolam (VERSED) injection    PRN Martinique, Peter M, MD   1 mg at 05/03/18 1336  . Radial Cocktail/Verapamil only    PRN Martinique, Peter M, MD       Current Outpatient Medications on File Prior to Encounter  Medication Sig Dispense Refill  . acetaminophen (TYLENOL) 325 MG tablet Take 2 tablets (650 mg total) by mouth every 4 (four) hours as needed for headache or mild pain.    Marland Kitchen amLODipine (NORVASC) 10 MG tablet Take 10 mg by mouth daily.    Marland Kitchen aspirin 325 MG tablet Take 1 tablet (325 mg total) by mouth daily. 30 tablet 3  . atorvastatin (LIPITOR) 40 MG tablet Take 1 tablet (40 mg total) by mouth daily at 6 PM. 30 tablet 3  . cetirizine  (ZYRTEC) 10 MG tablet Take 1 tablet (10 mg total) by mouth daily.    . cholecalciferol (VITAMIN D) 1000 units tablet Take 2,000 Units by mouth daily.    . citalopram (CELEXA) 20 MG tablet Take 1 tablet (20 mg total) by mouth daily. 30 tablet  5  . clopidogrel (PLAVIX) 75 MG tablet Take 1 tablet (75 mg total) by mouth daily. 30 tablet 3  . colchicine 0.6 MG tablet Take 0.6 mg by mouth 3 (three) times daily as needed (for gout flares).     . divalproex (DEPAKOTE) 500 MG DR tablet Take 1 tablet (500 mg total) by mouth every 12 (twelve) hours. 30 tablet 5  . gabapentin (NEURONTIN) 600 MG tablet Take 0.5 tablets (300 mg total) by mouth 3 (three) times daily. (Patient taking differently: Take 600 mg by mouth 3 (three) times daily. ) 90 tablet 0  . magnesium gluconate (MAGONATE) 500 MG tablet Take 500 mg by mouth 2 (two) times daily.    Marland Kitchen omeprazole (PRILOSEC) 20 MG capsule Take 20 mg by mouth daily.    . Oxycodone HCl 20 MG TABS Take 1 tablet (20 mg total) by mouth every 8 (eight) hours as needed. (Patient taking differently: Take 20 mg by mouth See admin instructions. Take 20 mg by mouth every 4-6 hours as needed for pain) 1 tablet 0  . vitamin B-12 (CYANOCOBALAMIN) 100 MCG tablet Take 1 tablet (100 mcg total) by mouth daily. 30 tablet 1    Review of Systems: a complete, 10pt review of systems was completed with pertinent positives and negatives as documented in the HPI  Physical Exam: Vitals:   06/28/19 1439 06/28/19 2222  BP:  (!) 138/99  Pulse:  99  Resp:  18  Temp:  98.7 F (37.1 C)  SpO2: 98% 99%   Gen: Alert and cooperative, chronically ill-appearing Head: normocephalic, atraumatic Eyes: extraocular motions intact, anicteric.  Neck: No midline tenderness, trachea midline, no crepitus or hematoma Chest: unlabored respirations, symmetrical air entry Cardiovascular: RRR, he does have palpable DP pulses bilaterally although the right is diminished compared to the left. Abdomen: soft,  nondistended, nontender. No mass or organomegaly.  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 Neuro: moves all extremities, mild weakness BLE  Psych: appropriate mood and affect, normal insight  Skin: warm and dry, multiple evolving bruises    CBC Latest Ref Rng & Units 06/28/2019 03/15/2019 03/15/2019  WBC 4.0 - 10.5 K/uL 16.4(H) - 12.6(H)  Hemoglobin 13.0 - 17.0 g/dL 13.4 14.6 13.7  Hematocrit 39.0 - 52.0 % 37.9(L) 43.0 41.5  Platelets 150 - 400 K/uL 280 - 366    CMP Latest Ref Rng & Units 06/28/2019 03/15/2019 03/15/2019  Glucose 70 - 99 mg/dL 165(H) 100(H) 102(H)  BUN 8 - 23 mg/dL 16 19 15   Creatinine 0.61 - 1.24 mg/dL 0.93 0.90 0.96  Sodium 135 - 145 mmol/L 131(L) 136 136  Potassium 3.5 - 5.1 mmol/L 3.5 3.2(L) 3.2(L)  Chloride 98 - 111 mmol/L 97(L) 98 99  CO2 22 - 32 mmol/L 19(L) - 22  Calcium 8.9 - 10.3 mg/dL 9.2 - 9.5  Total Protein 6.5 - 8.1 g/dL 7.1 - 7.1  Total Bilirubin 0.3 - 1.2 mg/dL 0.7 - 0.8  Alkaline Phos 38 - 126 U/L 65 - 77  AST 15 - 41 U/L 27 - 24  ALT 0 - 44 U/L 18 - 18    Lab Results  Component Value Date   INR 1.1 06/28/2019   INR 1.0 03/15/2019   INR 1.01 04/25/2018    Imaging: Ct Abdomen Pelvis W Contrast  Result Date: 06/28/2019 CLINICAL DATA:  [place] with weakness and abdominal pain. EXAM: CT ABDOMEN AND PELVIS WITH CONTRAST TECHNIQUE: Multidetector CT imaging of the abdomen and pelvis was performed  using the standard protocol following bolus administration of intravenous contrast. CONTRAST:  132mL OMNIPAQUE IOHEXOL 300 MG/ML  SOLN COMPARISON:  12/10/2017 FINDINGS: Lower chest: Lung bases are clear.  No pleural or pericardial fluid. Hepatobiliary: Normal Pancreas: Normal Spleen: Normal Adrenals/Urinary Tract: Adrenal glands are normal. There is enlargement of a solid mass in the lower pole of the right kidney measuring almost 5 cm in diameter, enlarged since last year's examination and highly likely to represent a renal  cell carcinoma. Simple cyst noted of the left kidney. Bladder is normal. Stomach/Bowel: No acute or primary bowel pathology. Vascular/Lymphatic: Aortic atherosclerosis. No aneurysm. IVC is normal. Severe stenosis of the right common iliac artery with near occlusion. No retroperitoneal adenopathy. Reproductive: Normal Other: Right-sided retroperitoneal hematoma both within the retroperitoneal fat and the iliopsoas musculature. This is likely a result the right-sided transverse process fractures from L1 through L4. No puddle sign to suggest ongoing acute rapid extravasation. Musculoskeletal: Right transverse process fractures from L1 through L4 as noted above, responsible for the retroperitoneal hemorrhage on the right. Advanced chronic degenerative changes of the spine with chronic loss of height of the lower thoracic and upper lumbar vertebral bodies. IMPRESSION: Transverse process fractures on the right from L1 through L4 with moderate to large right-sided retroperitoneal hemorrhage both within the retroperitoneal fat and within the iliopsoas musculature. Enlarging 5 cm mass in the lower pole of the right kidney likely to represent renal cell carcinoma. Atherosclerosis of the aorta and its branch vessels, including near occlusion of the right common iliac artery. Electronically Signed   By: Nelson Chimes M.D.   On: 06/28/2019 21:33   Dg Hip Unilat With Pelvis 2-3 Views Right  Result Date: 06/28/2019 CLINICAL DATA:  Right hip pain. Previous right total hip arthroplasty. EXAM: DG HIP (WITH OR WITHOUT PELVIS) 2-3V RIGHT COMPARISON:  CT scan of the abdomen and pelvis dated 12/10/2017 FINDINGS: There is no evidence of fracture or bone destruction. The components of the right total hip prosthesis appear in good position with no evidence of loosening. No acute abnormality of the pelvic bones. Neurostimulator generator overlies the right iliac bone. Aortic atherosclerosis. IMPRESSION: 1. No acute abnormalities of the  pelvis or right hip. 2. Aortic atherosclerosis. Electronically Signed   By: Lorriane Shire M.D.   On: 06/28/2019 15:24     A/P: 73yo man s/p fall L1-4 transverse process fx- pain control/ supportive care, lumbar corset for comfort- appreciate neurosurg input Retroperitoneal hematoma- no extrav, no clinical signs of ongoing bleeding. Given asa/plavix will observe and repeat labs in AM  Will order pt/ot for discharge safety planning  Continue home pain regimen with additional IV breakthrough  Likely R RCC- will need outpatient follow up.    Romana Juniper, MD [facility], [place] Pager [phone]

## 2019-06-29 DIAGNOSIS — S3681XA Injury of peritoneum, initial encounter: Secondary | ICD-10-CM | POA: Diagnosis not present

## 2019-06-29 DIAGNOSIS — G8929 Other chronic pain: Secondary | ICD-10-CM | POA: Diagnosis not present

## 2019-06-29 DIAGNOSIS — S32049A Unspecified fracture of fourth lumbar vertebra, initial encounter for closed fracture: Secondary | ICD-10-CM | POA: Diagnosis not present

## 2019-06-29 DIAGNOSIS — S32019A Unspecified fracture of first lumbar vertebra, initial encounter for closed fracture: Secondary | ICD-10-CM | POA: Diagnosis not present

## 2019-06-29 DIAGNOSIS — S32039A Unspecified fracture of third lumbar vertebra, initial encounter for closed fracture: Secondary | ICD-10-CM | POA: Diagnosis not present

## 2019-06-29 DIAGNOSIS — S32029A Unspecified fracture of second lumbar vertebra, initial encounter for closed fracture: Secondary | ICD-10-CM | POA: Diagnosis not present

## 2019-06-29 LAB — CBC
HCT: 34.2 % — ABNORMAL LOW (ref 39.0–52.0)
HCT: 30.3 % — ABNORMAL LOW (ref 39.0–52.0)
HCT: 31.8 % — ABNORMAL LOW (ref 39.0–52.0)
Hemoglobin: 11.9 g/dL — ABNORMAL LOW (ref 13.0–17.0)
Hemoglobin: 10.8 g/dL — ABNORMAL LOW (ref 13.0–17.0)
Hemoglobin: 11.1 g/dL — ABNORMAL LOW (ref 13.0–17.0)
MCH: 30.7 pg (ref 26.0–34.0)
MCH: 31.7 pg (ref 26.0–34.0)
MCH: 31.6 pg (ref 26.0–34.0)
MCHC: 34.8 g/dL (ref 30.0–36.0)
MCHC: 35.6 g/dL (ref 30.0–36.0)
MCHC: 34.9 g/dL (ref 30.0–36.0)
MCV: 88.4 fL (ref 80.0–100.0)
MCV: 88.9 fL (ref 80.0–100.0)
MCV: 90.6 fL (ref 80.0–100.0)
Platelets: 260 10*3/uL (ref 150–400)
Platelets: 210 10*3/uL (ref 150–400)
Platelets: 210 10*3/uL (ref 150–400)
RBC: 3.87 MIL/uL — ABNORMAL LOW (ref 4.22–5.81)
RBC: 3.41 MIL/uL — ABNORMAL LOW (ref 4.22–5.81)
RBC: 3.51 MIL/uL — ABNORMAL LOW (ref 4.22–5.81)
RDW: 13 % (ref 11.5–15.5)
RDW: 13.1 % (ref 11.5–15.5)
RDW: 13.1 % (ref 11.5–15.5)
WBC: 15.5 10*3/uL — ABNORMAL HIGH (ref 4.0–10.5)
WBC: 11.2 10*3/uL — ABNORMAL HIGH (ref 4.0–10.5)
WBC: 11 10*3/uL — ABNORMAL HIGH (ref 4.0–10.5)
nRBC: 0 % (ref 0.0–0.2)
nRBC: 0 % (ref 0.0–0.2)
nRBC: 0 % (ref 0.0–0.2)

## 2019-06-29 LAB — BASIC METABOLIC PANEL
Anion gap: 12 (ref 5–15)
BUN: 14 mg/dL (ref 8–23)
CO2: 24 mmol/L (ref 22–32)
Calcium: 9 mg/dL (ref 8.9–10.3)
Chloride: 98 mmol/L (ref 98–111)
Creatinine, Ser: 0.88 mg/dL (ref 0.61–1.24)
GFR calc Af Amer: >60 mL/min (ref >60)
GFR calc non Af Amer: >60 mL/min (ref >60)
Glucose, Bld: 113 mg/dL — ABNORMAL HIGH (ref 70–99)
Potassium: 3.6 mmol/L (ref 3.5–5.1)
Sodium: 134 mmol/L — ABNORMAL LOW (ref 135–145)

## 2019-06-29 LAB — URINALYSIS, ROUTINE W REFLEX MICROSCOPIC
Bilirubin Urine: NEGATIVE
Glucose, UA: NEGATIVE mg/dL
Hgb urine dipstick: NEGATIVE
Ketones, ur: 5 mg/dL — AB
Leukocytes,Ua: NEGATIVE
Nitrite: NEGATIVE
Protein, ur: NEGATIVE mg/dL
Specific Gravity, Urine: >1.046 — ABNORMAL HIGH (ref 1.005–1.030)
pH: 7 (ref 5.0–8.0)

## 2019-06-29 LAB — SARS CORONAVIRUS 2 (TAT 6-24 HRS): SARS Coronavirus 2: NEGATIVE

## 2019-06-29 MED ORDER — ACETAMINOPHEN 500 MG PO TABS
1000.0000 mg | ORAL_TABLET | Freq: Three times a day (TID) | ORAL | Status: DC
  Administered 2019-06-29 – 2019-07-01 (×8): 1000 mg via ORAL
  Filled 2019-06-29 (×8): qty 2

## 2019-06-29 MED ORDER — OXYCODONE HCL 5 MG PO TABS: 5.0000 mg | ORAL_TABLET | ORAL | Status: DC | PRN

## 2019-06-29 MED ORDER — METHOCARBAMOL 750 MG PO TABS
750.0000 mg | ORAL_TABLET | Freq: Three times a day (TID) | ORAL | Status: DC
  Administered 2019-06-29 – 2019-06-30 (×4): 750 mg via ORAL
  Filled 2019-06-29 (×2): qty 1
  Filled 2019-06-29 (×2): qty 2

## 2019-06-29 MED ORDER — OXYCODONE HCL 5 MG PO TABS
15.0000 mg | ORAL_TABLET | Freq: Four times a day (QID) | ORAL | Status: DC | PRN
  Administered 2019-06-29 – 2019-07-01 (×6): 20 mg via ORAL
  Filled 2019-06-29 (×6): qty 4

## 2019-06-29 MED ORDER — HYDROMORPHONE HCL 1 MG/ML IJ SOLN
1.0000 mg | INTRAMUSCULAR | Status: DC | PRN
  Administered 2019-06-29 – 2019-07-01 (×10): 1 mg via INTRAVENOUS
  Filled 2019-06-29 (×10): qty 1

## 2019-06-29 MED ORDER — METHOCARBAMOL 500 MG PO TABS: 500.0000 mg | ORAL_TABLET | Freq: Three times a day (TID) | ORAL | Status: DC

## 2019-06-29 NOTE — ED Notes (Signed)
ED TO INPATIENT HANDOFF REPORT  ED Nurse Name and Phone #: Annie Main [phone]  S Name/Age/Gender William Jordan 73 y.o. male Room/Bed: H020C/H020C  Code Status   Code Status: Full Code  Home/SNF/Other Home Patient oriented to: self, place, time and situation Is this baseline? Yes   Triage Complete: Triage complete  Chief Complaint Fall from sitting  Triage Note Pt arrives EMS [place] after falling getting off BSC transferring to chair , pt states fell on rt hip that now hurts , pt was able to stand and  Get into w/c from strt no shortening or roattion has been requesting pain meds per ems  Pt is on a lot of pain meds.    Allergies Allergies  Allergen Reactions  . Ace Inhibitors Shortness Of Breath, Swelling and Other (See Comments)    Angioedema (throat and tongue became swollen)  . Morphine Other (See Comments)    Causes confusion and "makes me crazy," per the patient  . Sulfa Antibiotics Other (See Comments)    Unknown reaction, from childhood    Level of Care/Admitting Diagnosis ED Disposition    ED Disposition Condition [place]: [place] [[id number]]  Level of Care: Progressive [102]  Covid Evaluation: Asymptomatic Screening Protocol (No Symptoms)  Diagnosis: Traumatic retroperitoneal hematoma [id number]  Admitting Physician: TRAUMA MD [[id number]]  Attending Physician: TRAUMA MD [[id number]]  Bed request comments: 4NP  PT Class (Do Not Modify): Observation [104]  PT Acc Code (Do Not Modify): Observation [10022]       B Medical/Surgery History Past Medical History:  Diagnosis Date  . Angioedema    from Benazepril reaction  . Anxiety    takes Celexa  . Arthritis   . Chronic back pain   . CVA (cerebral vascular accident) ([place])   . Enlarged prostate   . History of blood transfusion   . Hypertension   . MI (myocardial infarction) ([place])   . Neuropathy    Past Surgical History:  Procedure Laterality Date  . CARDIAC CATHETERIZATION   05/03/2018  . COLONOSCOPY     one polyp removed - benign  . ELBOW SURGERY Right   . HERNIA REPAIR     umbilical  . JOINT REPLACEMENT Left    knee, x 2  . JOINT REPLACEMENT Right    hip  . JOINT REPLACEMENT Left    partial shoulder  . LEFT HEART CATH AND CORONARY ANGIOGRAPHY N/A 05/03/2018   Procedure: LEFT HEART CATH AND CORONARY ANGIOGRAPHY;  Surgeon: Martinique, Peter M, MD;  Location: [facility];  Service: Cardiovascular;  Laterality: N/A;  . LOOP RECORDER INSERTION N/A 04/27/2018   Procedure: LOOP RECORDER INSERTION;  Surgeon: Evans Lance, MD;  Location: [facility];  Service: Cardiovascular;  Laterality: N/A;  . SPINAL CORD STIMULATOR INSERTION N/A 09/28/2014   Procedure: LUMBAR SPINAL CORD STIMULATOR INSERTION;  Surgeon: Melina Schools, MD;  Location: [facility];  Service: Orthopedics;  Laterality: N/A;  . TEE WITHOUT CARDIOVERSION N/A 04/27/2018   Procedure: TRANSESOPHAGEAL ECHOCARDIOGRAM (TEE);  Surgeon: Larey Dresser, MD;  Location: [facility] : [facility] ENDOS[facility];  Service: Cardiovascular;  Laterality: N/A;  . TONSILLECTOMY    . VASECTOMY       A IV Location/Drains/Wounds Patient Lines/Drains/Airways Status   Active Line/Drains/Airways    Name:   Placement date:   Placement time:   Site:   Days:   Peripheral IV 06/29/19 Left Hand   06/29/19    1224    Hand  less than 1   External Urinary Catheter   11/06/18    0947    -   235   Incision (Closed) 04/28/18 Chest Left;Mid   04/28/18    1800     427          Intake/Output Last 24 hours No intake or output data in the 24 hours ending 06/29/19 1908  Labs/Imaging Results for orders placed or performed during the hospital encounter of 06/28/19 (from the past 48 hour(s))  Comprehensive metabolic panel     Status: Abnormal   Collection Time: 06/28/19  7:44 PM  Result Value Ref Range   Sodium 131 (L) 135 - 145 mmol/L   Potassium 3.5 3.5 - 5.1 mmol/L   Chloride 97 (L) 98 - 111 mmol/L   CO2 19 (L) 22 - 32 mmol/L   Glucose, Bld  165 (H) 70 - 99 mg/dL   BUN 16 8 - 23 mg/dL   Creatinine, Ser 0.93 0.61 - 1.24 mg/dL   Calcium 9.2 8.9 - 10.3 mg/dL   Total Protein 7.1 6.5 - 8.1 g/dL   Albumin 4.1 3.5 - 5.0 g/dL   AST 27 15 - 41 U/L   ALT 18 0 - 44 U/L   Alkaline Phosphatase 65 38 - 126 U/L   Total Bilirubin 0.7 0.3 - 1.2 mg/dL   GFR calc non Af Amer >60 >60 mL/min   GFR calc Af Amer >60 >60 mL/min   Anion gap 15 5 - 15    Comment: Performed at [facility], [place] [address] [place], [place] [address]  CBC with Differential     Status: Abnormal   Collection Time: 06/28/19  7:44 PM  Result Value Ref Range   WBC 16.4 (H) 4.0 - 10.5 K/uL   RBC 4.23 4.22 - 5.81 MIL/uL   Hemoglobin 13.4 13.0 - 17.0 g/dL   HCT 37.9 (L) 39.0 - 52.0 %   MCV 89.6 80.0 - 100.0 fL   MCH 31.7 26.0 - 34.0 pg   MCHC 35.4 30.0 - 36.0 g/dL   RDW 12.9 11.5 - 15.5 %   Platelets 280 150 - 400 K/uL   nRBC 0.0 0.0 - 0.2 %   Neutrophils Relative % 89 %   Neutro Abs 14.6 (H) 1.7 - 7.7 K/uL   Lymphocytes Relative 5 %   Lymphs Abs 0.7 0.7 - 4.0 K/uL   Monocytes Relative 5 %   Monocytes Absolute 0.8 0.1 - 1.0 K/uL   Eosinophils Relative 0 %   Eosinophils Absolute 0.1 0.0 - 0.5 K/uL   Basophils Relative 0 %   Basophils Absolute 0.0 0.0 - 0.1 K/uL   Immature Granulocytes 1 %   Abs Immature Granulocytes 0.19 (H) 0.00 - 0.07 K/uL    Comment: Performed at [facility] [address] [place], [place] [address]  Protime-INR     Status: None   Collection Time: 06/28/19 10:14 PM  Result Value Ref Range   Prothrombin Time 14.2 11.4 - 15.2 seconds   INR 1.1 0.8 - 1.2    Comment: (NOTE) INR goal varies based on device and disease states. Performed at [facility], [place] [address] [place], [place] [address]   Type and screen [place]     Status: None   Collection Time: 06/28/19 10:14 PM  Result Value Ref Range   ABO/RH(D) O POS    Antibody Screen NEG    Sample Expiration  07/01/2019,2359 Performed at  [facility] [address] [place], [place] [address]   SARS CORONAVIRUS 2 (TAT 6-24 HRS) Nasopharyngeal Nasopharyngeal Swab     Status: None   Collection Time: 06/28/19 11:10 PM   Specimen: Nasopharyngeal Swab  Result Value Ref Range   SARS Coronavirus 2 NEGATIVE NEGATIVE    Comment: (NOTE) SARS-CoV-2 target nucleic acids are NOT DETECTED. The SARS-CoV-2 RNA is generally detectable in upper and lower respiratory specimens during the acute phase of infection. Negative results do not preclude SARS-CoV-2 infection, do not rule out co-infections with other pathogens, and should not be used as the sole basis for treatment or other patient management decisions. Negative results must be combined with clinical observations, patient history, and epidemiological information. The expected result is Negative. Fact Sheet for Patients: SugarRoll.be Fact Sheet for Healthcare Providers: https://www.woods-mathews.com/ This test is not yet approved or cleared by the [place] FDA and  has been authorized for detection and/or diagnosis of SARS-CoV-2 by FDA under an Emergency Use Authorization (EUA). This EUA will remain  in effect (meaning this test can be used) for the duration of the COVID-19 declaration under [place] 56 4(b)(1) of the Act, 21 U.S.C. [place] 360bbb-3(b)(1), unless the authorization is terminated or revoked sooner. Performed at [facility], [place] [address] [place], [place] [address]   Urinalysis, Routine w reflex microscopic     Status: Abnormal   Collection Time: 06/28/19 11:53 PM  Result Value Ref Range   Color, Urine YELLOW YELLOW   APPearance CLEAR CLEAR   Specific Gravity, Urine >1.046 (H) 1.005 - 1.030   pH 7.0 5.0 - 8.0   Glucose, UA NEGATIVE NEGATIVE mg/dL   Hgb urine dipstick NEGATIVE NEGATIVE   Bilirubin Urine NEGATIVE NEGATIVE   Ketones, ur 5 (A) NEGATIVE mg/dL   Protein, ur NEGATIVE NEGATIVE mg/dL    Nitrite NEGATIVE NEGATIVE   Leukocytes,Ua NEGATIVE NEGATIVE    Comment: Performed at [facility] [address], [place], [place] [address]  CBC     Status: Abnormal   Collection Time: 06/29/19  3:43 AM  Result Value Ref Range   WBC 15.5 (H) 4.0 - 10.5 K/uL   RBC 3.87 (L) 4.22 - 5.81 MIL/uL   Hemoglobin 11.9 (L) 13.0 - 17.0 g/dL   HCT 34.2 (L) 39.0 - 52.0 %   MCV 88.4 80.0 - 100.0 fL   MCH 30.7 26.0 - 34.0 pg   MCHC 34.8 30.0 - 36.0 g/dL   RDW 13.0 11.5 - 15.5 %   Platelets 260 150 - 400 K/uL   nRBC 0.0 0.0 - 0.2 %    Comment: Performed at [facility], [place] [address], [place], [place] [id number]  Basic metabolic panel     Status: Abnormal   Collection Time: 06/29/19  3:43 AM  Result Value Ref Range   Sodium 134 (L) 135 - 145 mmol/L   Potassium 3.6 3.5 - 5.1 mmol/L   Chloride 98 98 - 111 mmol/L   CO2 24 22 - 32 mmol/L   Glucose, Bld 113 (H) 70 - 99 mg/dL   BUN 14 8 - 23 mg/dL   Creatinine, Ser 0.88 0.61 - 1.24 mg/dL   Calcium 9.0 8.9 - 10.3 mg/dL   GFR calc non Af Amer >60 >60 mL/min   GFR calc Af Amer >60 >60 mL/min   Anion gap 12 5 - 15    Comment: Performed at [facility], [place] [address]  [place], [place] [address]  CBC     Status: Abnormal   Collection Time: 06/29/19 10:00 AM  Result Value Ref Range   WBC 11.2 (H) 4.0 - 10.5 K/uL   RBC 3.41 (L) 4.22 - 5.81 MIL/uL   Hemoglobin 10.8 (L) 13.0 - 17.0 g/dL   HCT 30.3 (L) 39.0 - 52.0 %   MCV 88.9 80.0 - 100.0 fL   MCH 31.7 26.0 - 34.0 pg   MCHC 35.6 30.0 - 36.0 g/dL   RDW 13.1 11.5 - 15.5 %   Platelets 210 150 - 400 K/uL   nRBC 0.0 0.0 - 0.2 %    Comment: Performed at [facility], [place] [address], [place], [place] [address]  CBC     Status: Abnormal   Collection Time: 06/29/19  5:21 PM  Result Value Ref Range   WBC 11.0 (H) 4.0 - 10.5 K/uL   RBC 3.51 (L) 4.22 - 5.81 MIL/uL   Hemoglobin 11.1 (L) 13.0 - 17.0 g/dL   HCT 31.8 (L) 39.0 - 52.0 %   MCV 90.6 80.0 - 100.0 fL   MCH 31.6 26.0 - 34.0  pg   MCHC 34.9 30.0 - 36.0 g/dL   RDW 13.1 11.5 - 15.5 %   Platelets 210 150 - 400 K/uL   nRBC 0.0 0.0 - 0.2 %    Comment: Performed at [facility], [place] [address] [place], [place] [address]   Ct Abdomen Pelvis W Contrast  Result Date: 06/28/2019 CLINICAL DATA:  [place] with weakness and abdominal pain. EXAM: CT ABDOMEN AND PELVIS WITH CONTRAST TECHNIQUE: Multidetector CT imaging of the abdomen and pelvis was performed using the standard protocol following bolus administration of intravenous contrast. CONTRAST:  143mL OMNIPAQUE IOHEXOL 300 MG/ML  SOLN COMPARISON:  12/10/2017 FINDINGS: Lower chest: Lung bases are clear.  No pleural or pericardial fluid. Hepatobiliary: Normal Pancreas: Normal Spleen: Normal Adrenals/Urinary Tract: Adrenal glands are normal. There is enlargement of a solid mass in the lower pole of the right kidney measuring almost 5 cm in diameter, enlarged since last year's examination and highly likely to represent a renal cell carcinoma. Simple cyst noted of the left kidney. Bladder is normal. Stomach/Bowel: No acute or primary bowel pathology. Vascular/Lymphatic: Aortic atherosclerosis. No aneurysm. IVC is normal. Severe stenosis of the right common iliac artery with near occlusion. No retroperitoneal adenopathy. Reproductive: Normal Other: Right-sided retroperitoneal hematoma both within the retroperitoneal fat and the iliopsoas musculature. This is likely a result the right-sided transverse process fractures from L1 through L4. No puddle sign to suggest ongoing acute rapid extravasation. Musculoskeletal: Right transverse process fractures from L1 through L4 as noted above, responsible for the retroperitoneal hemorrhage on the right. Advanced chronic degenerative changes of the spine with chronic loss of height of the lower thoracic and upper lumbar vertebral bodies. IMPRESSION: Transverse process fractures on the right from L1 through L4 with moderate to large right-sided  retroperitoneal hemorrhage both within the retroperitoneal fat and within the iliopsoas musculature. Enlarging 5 cm mass in the lower pole of the right kidney likely to represent renal cell carcinoma. Atherosclerosis of the aorta and its branch vessels, including near occlusion of the right common iliac artery. Electronically Signed   By: Nelson Chimes M.D.   On: 06/28/2019 21:33   Dg Chest Port 1 View  Result Date: 06/28/2019 CLINICAL DATA:  73 year old male status post trip and fall on back yesterday. EXAM: PORTABLE CHEST 1 VIEW COMPARISON:  Chest radiographs 11/05/2018 and earlier. FINDINGS:  Portable AP semi upright view at 2343 hours. Chronically low lung volumes, improved today. Chronic left chest cardiac event recorder and thoracic spinal stimulator. Chronic left shoulder arthroplasty. Allowing for portable technique the lungs are clear. Mediastinal contours and tracheal air column within normal limits. Negative visible bowel gas pattern. Chronic right clavicle deformity. Possible chronic posterior left 5th and 6th rib fractures appear stable. No acute osseous abnormality identified. IMPRESSION: No acute cardiopulmonary abnormality or acute traumatic injury identified. Electronically Signed   By: Genevie Ann M.D.   On: 06/28/2019 23:54   Dg Hip Unilat With Pelvis 2-3 Views Right  Result Date: 06/28/2019 CLINICAL DATA:  Right hip pain. Previous right total hip arthroplasty. EXAM: DG HIP (WITH OR WITHOUT PELVIS) 2-3V RIGHT COMPARISON:  CT scan of the abdomen and pelvis dated 12/10/2017 FINDINGS: There is no evidence of fracture or bone destruction. The components of the right total hip prosthesis appear in good position with no evidence of loosening. No acute abnormality of the pelvic bones. Neurostimulator generator overlies the right iliac bone. Aortic atherosclerosis. IMPRESSION: 1. No acute abnormalities of the pelvis or right hip. 2. Aortic atherosclerosis. Electronically Signed   By: Lorriane Shire  M.D.   On: 06/28/2019 15:24    Pending Labs Unresulted Labs (From admission, onward)    Start     Ordered   06/30/19 0500  CBC  Tomorrow morning,   R     06/29/19 0818   06/30/19 [id number]  Basic metabolic panel  Tomorrow morning,   R     06/29/19 0818          Vitals/Pain Today's Vitals   06/29/19 0830 06/29/19 0845 06/29/19 0850 06/29/19 1103  BP: 140/77 138/80    Pulse:      Resp: 11 17    Temp:      TempSrc:      SpO2:      PainSc:   8  9     Isolation Precautions No active isolations  Medications Medications  metoprolol tartrate (LOPRESSOR) injection 5 mg (has no administration in time range)  hydrALAZINE (APRESOLINE) injection 10 mg (has no administration in time range)  pantoprazole (PROTONIX) EC tablet 40 mg (40 mg Oral Given 06/29/19 0921)    Or  pantoprazole (PROTONIX) injection 40 mg ( Intravenous See Alternative 06/29/19 0921)  ondansetron (ZOFRAN-ODT) disintegrating tablet 4 mg (has no administration in time range)    Or  ondansetron (ZOFRAN) injection 4 mg (has no administration in time range)  docusate sodium (COLACE) capsule 100 mg (100 mg Oral Given 06/29/19 0922)  bisacodyl (DULCOLAX) suppository 10 mg (has no administration in time range)  amLODipine (NORVASC) tablet 10 mg (10 mg Oral Given 06/29/19 0922)  citalopram (CELEXA) tablet 20 mg (20 mg Oral Given 06/29/19 0921)  colchicine tablet 0.6 mg (has no administration in time range)  gabapentin (NEURONTIN) capsule 300 mg (300 mg Oral Given 06/29/19 0922)  magnesium oxide (MAG-OX) tablet 400 mg (400 mg Oral Given 06/29/19 0922)  acetaminophen (TYLENOL) tablet 1,000 mg (1,000 mg Oral Given 06/29/19 1616)  HYDROmorphone (DILAUDID) injection 1 mg (1 mg Intravenous Given 06/29/19 1617)  methocarbamol (ROBAXIN) tablet 750 mg (750 mg Oral Given 06/29/19 1616)  oxyCODONE (Oxy IR/ROXICODONE) immediate release tablet 15-20 mg (20 mg Oral Given 06/29/19 1849)  oxyCODONE (Oxy IR/ROXICODONE) immediate release  tablet 10 mg (10 mg Oral Given 06/28/19 2026)  iohexol (OMNIPAQUE) 300 MG/ML solution 100 mL (100 mLs Intravenous Contrast Given 06/28/19 2059)  oxyCODONE (Oxy IR/ROXICODONE) immediate  release tablet 10 mg (10 mg Oral Given 06/28/19 2142)  fentaNYL (SUBLIMAZE) injection 50 mcg (50 mcg Intravenous Given 06/28/19 2222)    Mobility non-ambulatory High fall risk   Focused Assessments    R Recommendations: See Admitting Provider Note  Report given to:   Additional Notes:

## 2019-06-29 NOTE — Progress Notes (Signed)
[facility] Progress Note     Subjective: CC: pain Patient complaining of severe back pain and inability to get up although he feels like he is getting stiff lying in bed. Denies abdominal pain, nausea or vomiting. Has tolerated CLD and passing flatus. Patient denies SOB but reports he has pain in his back with taking a deep breath. He lives at home with his wife. He has had several falls recently which he attributes to his gout.   Objective: Vital signs in last 24 hours: Temp:  [98.2 F (36.8 C)-99.7 F (37.6 C)] 99.7 F (37.6 C) (10/14 0312) Pulse Rate:  [73-106] 103 (10/14 0430) Resp:  [11-26] 15 (10/14 0645) BP: (119-177)/(71-121) 125/72 (10/14 0645) SpO2:  [95 %-99 %] 95 % (10/14 0430)    Intake/Output from previous day: No intake/output data recorded. Intake/Output this shift: No intake/output data recorded.  PE: Gen:  Alert, NAD Card:  Regular rate and rhythm, pedal pulses 2+ BL Pulm:  Normal effort, clear to auscultation bilaterally Abd: Soft, non-tender, non-distended, +BS Skin: warm and dry, no rashes  Ext: Moves all extremities although somewhat limited by pain, good strength in hands and feet BL Psych: A&Ox3   Lab Results:  Recent Labs    06/28/19 1944 06/29/19 0343  WBC 16.4* 15.5*  HGB 13.4 11.9*  HCT 37.9* 34.2*  PLT 280 260   BMET Recent Labs    06/28/19 1944 06/29/19 0343  NA 131* 134*  K 3.5 3.6  CL 97* 98  CO2 19* 24  GLUCOSE 165* 113*  BUN 16 14  CREATININE 0.93 0.88  CALCIUM 9.2 9.0   PT/INR Recent Labs    06/28/19 2214  LABPROT 14.2  INR 1.1   CMP     Component Value Date/Time   NA 134 (L) 06/29/2019 0343   K 3.6 06/29/2019 0343   CL 98 06/29/2019 0343   CO2 24 06/29/2019 0343   GLUCOSE 113 (H) 06/29/2019 0343   BUN 14 06/29/2019 0343   CREATININE 0.88 06/29/2019 0343   CALCIUM 9.0 06/29/2019 0343   PROT 7.1 06/28/2019 1944   ALBUMIN 4.1 06/28/2019 1944   AST 27 06/28/2019 1944   ALT 18 06/28/2019 1944    ALKPHOS 65 06/28/2019 1944   BILITOT 0.7 06/28/2019 1944   GFRNONAA >60 06/29/2019 0343   GFRAA >60 06/29/2019 0343   Lipase     Component Value Date/Time   LIPASE 17 11/05/2018 2118       Studies/Results: Ct Abdomen Pelvis W Contrast  Result Date: 06/28/2019 CLINICAL DATA:  [place] with weakness and abdominal pain. EXAM: CT ABDOMEN AND PELVIS WITH CONTRAST TECHNIQUE: Multidetector CT imaging of the abdomen and pelvis was performed using the standard protocol following bolus administration of intravenous contrast. CONTRAST:  161mL OMNIPAQUE IOHEXOL 300 MG/ML  SOLN COMPARISON:  12/10/2017 FINDINGS: Lower chest: Lung bases are clear.  No pleural or pericardial fluid. Hepatobiliary: Normal Pancreas: Normal Spleen: Normal Adrenals/Urinary Tract: Adrenal glands are normal. There is enlargement of a solid mass in the lower pole of the right kidney measuring almost 5 cm in diameter, enlarged since last year's examination and highly likely to represent a renal cell carcinoma. Simple cyst noted of the left kidney. Bladder is normal. Stomach/Bowel: No acute or primary bowel pathology. Vascular/Lymphatic: Aortic atherosclerosis. No aneurysm. IVC is normal. Severe stenosis of the right common iliac artery with near occlusion. No retroperitoneal adenopathy. Reproductive: Normal Other: Right-sided retroperitoneal hematoma both within the retroperitoneal fat and the iliopsoas musculature. This is  likely a result the right-sided transverse process fractures from L1 through L4. No puddle sign to suggest ongoing acute rapid extravasation. Musculoskeletal: Right transverse process fractures from L1 through L4 as noted above, responsible for the retroperitoneal hemorrhage on the right. Advanced chronic degenerative changes of the spine with chronic loss of height of the lower thoracic and upper lumbar vertebral bodies. IMPRESSION: Transverse process fractures on the right from L1 through L4 with moderate to large  right-sided retroperitoneal hemorrhage both within the retroperitoneal fat and within the iliopsoas musculature. Enlarging 5 cm mass in the lower pole of the right kidney likely to represent renal cell carcinoma. Atherosclerosis of the aorta and its branch vessels, including near occlusion of the right common iliac artery. Electronically Signed   By: Nelson Chimes M.D.   On: 06/28/2019 21:33   Dg Chest Port 1 View  Result Date: 06/28/2019 CLINICAL DATA:  73 year old male status post trip and fall on back yesterday. EXAM: PORTABLE CHEST 1 VIEW COMPARISON:  Chest radiographs 11/05/2018 and earlier. FINDINGS: Portable AP semi upright view at 2343 hours. Chronically low lung volumes, improved today. Chronic left chest cardiac event recorder and thoracic spinal stimulator. Chronic left shoulder arthroplasty. Allowing for portable technique the lungs are clear. Mediastinal contours and tracheal air column within normal limits. Negative visible bowel gas pattern. Chronic right clavicle deformity. Possible chronic posterior left 5th and 6th rib fractures appear stable. No acute osseous abnormality identified. IMPRESSION: No acute cardiopulmonary abnormality or acute traumatic injury identified. Electronically Signed   By: Genevie Ann M.D.   On: 06/28/2019 23:54   Dg Hip Unilat With Pelvis 2-3 Views Right  Result Date: 06/28/2019 CLINICAL DATA:  Right hip pain. Previous right total hip arthroplasty. EXAM: DG HIP (WITH OR WITHOUT PELVIS) 2-3V RIGHT COMPARISON:  CT scan of the abdomen and pelvis dated 12/10/2017 FINDINGS: There is no evidence of fracture or bone destruction. The components of the right total hip prosthesis appear in good position with no evidence of loosening. No acute abnormality of the pelvic bones. Neurostimulator generator overlies the right iliac bone. Aortic atherosclerosis. IMPRESSION: 1. No acute abnormalities of the pelvis or right hip. 2. Aortic atherosclerosis. Electronically Signed   By: Lorriane Shire M.D.   On: 06/28/2019 15:24    Anti-infectives: Anti-infectives (From admission, onward)   None       Assessment/Plan Fall Retroperitoneal hematoma without extravasation - hgb 11.9 from 13.4, repeat CBC later this AM and this afternoon Incidental finding, likely R renal cell carcinoma - discussed with patient, outpatient follow up HTN - home meds Chronic pain - home regimen is 20 mg oxycodone q8h prn, gabapentin - will place on 15-20 mg q6h prn with acute pain  FEN: FLD, IVF VTE: SCDs, holding plavix and ASA ID: no abx indicated  Dispo: trend hgb. Pain control. PT/OT  LOS: 0 days    Brigid Re , [facility] 06/29/2019, 7:29 AM Pager: [phone]

## 2019-06-29 NOTE — Progress Notes (Signed)
Orth[facility] Tech Progress Note Patient Details:  William Jordan September 15, 1946 [id number]  Patient ID: William Jordan, male   DOB: [birth year], 73 y.o.   MRN: [id number]   William Jordan 06/29/2019, 8:04 AMCalled [employer] for Lumbar corset.

## 2019-06-29 NOTE — ED Notes (Signed)
[place], [place] [phone]    Called for pt updates.

## 2019-06-29 NOTE — ED Notes (Signed)
Ortho contacted for lumbar corsett, was told it will be tomorrow morning before application.

## 2019-06-29 NOTE — Evaluation (Signed)
Physical Therapy Evaluation Patient Details Name: William Jordan MRN: [id number] DOB: [birth year] Today's Date: 06/29/2019   History of Present Illness  73 year old gentleman with numerous medical problems which include chronic pain, weakness and neuropathy with recent h/o frequent falls despite use of a walker.  He had his "worst fall "yet today, when he tripped over a piece of furniture in the living room and fell on his back.  Found to have L1-4 transverse process fx and Retroperitoneal hematoma with incidental finding of possible R renal cell carcinoma.  Clinical Impression  Patient presents with decreased independence with mobility due to pain, limited activity tolerance, decreased balance with previous falls, decreased safety awareness and will benefit from skilled PT in the acute setting and likely follow up SNF level rehab at d/c.  His main concern is falling again as pain and new orthopedic device (lumbar corset) may increase risk of falls.  Currently min to mod A and pt light headed and with some decreased responsiveness sitting EOB after standing.  RN made aware.     Follow Up Recommendations SNF;Supervision/Assistance - 24 hour    Equipment Recommendations  None recommended by PT    Recommendations for Other Services       Precautions / Restrictions Precautions Precautions: Fall;Back Precaution Booklet Issued: No Required Braces or Orthoses: Spinal Brace Spinal Brace: Lumbar corset;Applied in sitting position      Mobility  Bed Mobility Overal bed mobility: Needs Assistance Bed Mobility: Rolling;Sidelying to Sit;Sit to Supine Rolling: Supervision Sidelying to sit: Mod assist;HOB elevated   Sit to supine: Mod assist;HOB elevated   General bed mobility comments: on stretcher in hallway in ED. able to roll with rail, then lowered rail and assisted to lift trunk with cues, to supine assist for legs onto bed and to position in middle of stretcher  Transfers Overall transfer  level: Needs assistance Equipment used: Ambulation equipment used(holding back of chair) Transfers: Sit to/from Stand Sit to Stand: Min assist;From elevated surface         General transfer comment: Donned LSO in sitting EOB; height of stretcher taller than standard, min A fot stand, side steps to [facility] and sat on EOB  Ambulation/Gait             General Gait Details: NT, dizziness standing  Stairs            Wheelchair Mobility    Modified Rankin (Stroke Patients Only)       Balance Overall balance assessment: Needs assistance Sitting-balance support: Feet unsupported Sitting balance-Leahy Scale: Fair Sitting balance - Comments: no LOB sitting edge of stretcher feet dangling   Standing balance support: Single extremity supported Standing balance-Leahy Scale: Poor Standing balance comment: held onto back of chair, able to take side steps, but pain in R lower back>L                             Pertinent Vitals/Pain Pain Assessment: 0-10 Pain Score: 9  Pain Location: back Pain Descriptors / Indicators: Grimacing;Guarding;Aching;Sore;Sharp Pain Intervention(s): Monitored during session;Repositioned;Limited activity within patient's tolerance    Home Living Family/patient expects to be discharged to:: Private residence Living Arrangements: Spouse/significant other Available Help at Discharge: Family;Available 24 hours/day Type of Home: House Home Access: Stairs to enter Entrance Stairs-Rails: Psychiatric nurse of Steps: 3 Home Layout: One level Home Equipment: Walker - 4 wheels;Hand held shower head;Grab bars - tub/shower;Shower seat - built in;Grab bars - toilet  Prior Function Level of Independence: Independent with assistive device(s)         Comments: wife helps with IADL's, pt manages ADL's with adaptive set up, but also multiple falls.     Hand Dominance   Dominant Hand: Right    Extremity/Trunk Assessment    Upper Extremity Assessment Upper Extremity Assessment: Overall WFL for tasks assessed(multiple bruises on elbows)    Lower Extremity Assessment Lower Extremity Assessment: RLE deficits/detail;LLE deficits/detail RLE Deficits / Details: AROM WFL, strength at least 3/5, but painful with hip flexion so NT, knee extension 4+/5 RLE Sensation: history of peripheral neuropathy LLE Deficits / Details: AROM WFL, note foot deformity-almost like [place]arc[place] (reports had untreated injury when he was young); hip flexion 3+/5, knee extension 4+/5 LLE Sensation: history of peripheral neuropathy    Cervical / Trunk Assessment Cervical / Trunk Assessment: Other exceptions Cervical / Trunk Exceptions: limited trunk mobility due to pain  Communication   Communication: No difficulties  Cognition Arousal/Alertness: Awake/alert Behavior During Therapy: WFL for tasks assessed/performed Overall Cognitive Status: No family/caregiver present to determine baseline cognitive functioning                                 General Comments: overall WFL for evaluation, but likely has problem solving/safety issues contributing to falls      General Comments General comments (skin integrity, edema, etc.): patient light headed after standing; noted some pre-sycnopal symptoms so assisted to supine momentary decreased responsiveness, on heart monitor without changes, no BP cuff avail.  RN aware    Exercises     Assessment/Plan    PT Assessment Patient needs continued PT services  PT Problem List Decreased strength;Decreased activity tolerance;Decreased mobility;Decreased safety awareness;Decreased balance;Pain;Decreased knowledge of use of DME;Decreased knowledge of precautions       PT Treatment Interventions DME instruction;Therapeutic activities;Balance training;Patient/family education;Therapeutic exercise;Functional mobility training;Gait training    PT Goals (Current goals can be found in the Care  Plan section)  Acute Rehab PT Goals Patient Stated Goal: to stop falling PT Goal Formulation: With patient Time For Goal Achievement: 07/13/19 Potential to Achieve Goals: Good    Frequency Min 3X/week   Barriers to discharge        Co-evaluation               AM-PAC PT "6 Clicks" Mobility  Outcome Measure Help needed turning from your back to your side while in a flat bed without using bedrails?: A Little Help needed moving from lying on your back to sitting on the side of a flat bed without using bedrails?: A Little Help needed moving to and from a bed to a chair (including a wheelchair)?: A Little Help needed standing up from a chair using your arms (e.g., wheelchair or bedside chair)?: A Little Help needed to walk in hospital room?: A Lot Help needed climbing 3-5 steps with a railing? : A Lot 6 Click Score: 16    End of Session Equipment Utilized During Treatment: Back brace Activity Tolerance: Patient limited by pain;Other (comment)(limited by pre-syncopal symptoms) Patient left: in bed Nurse Communication: Mobility status PT Visit Diagnosis: Other abnormalities of gait and mobility (R26.89);History of falling (Z91.81);Pain Pain - part of body: (back)    Time: [id number] PT Time Calculation (min) (ACUTE ONLY): 22 min   Charges:   PT Evaluation $PT Eval Moderate Complexity: 1 Mod          Magda Kiel, PT  Acute Rehabilitation Services [phone] 06/29/2019   Reginia Naas 06/29/2019, 12:54 PM

## 2019-06-30 DIAGNOSIS — S32029A Unspecified fracture of second lumbar vertebra, initial encounter for closed fracture: Secondary | ICD-10-CM | POA: Diagnosis not present

## 2019-06-30 DIAGNOSIS — G8929 Other chronic pain: Secondary | ICD-10-CM | POA: Diagnosis not present

## 2019-06-30 DIAGNOSIS — S32049A Unspecified fracture of fourth lumbar vertebra, initial encounter for closed fracture: Secondary | ICD-10-CM | POA: Diagnosis not present

## 2019-06-30 DIAGNOSIS — S32039A Unspecified fracture of third lumbar vertebra, initial encounter for closed fracture: Secondary | ICD-10-CM | POA: Diagnosis not present

## 2019-06-30 DIAGNOSIS — S32019A Unspecified fracture of first lumbar vertebra, initial encounter for closed fracture: Secondary | ICD-10-CM | POA: Diagnosis not present

## 2019-06-30 DIAGNOSIS — S3681XA Injury of peritoneum, initial encounter: Secondary | ICD-10-CM | POA: Diagnosis not present

## 2019-06-30 LAB — CBC
HCT: 29.3 % — ABNORMAL LOW (ref 39.0–52.0)
Hemoglobin: 10.4 g/dL — ABNORMAL LOW (ref 13.0–17.0)
MCH: 31.5 pg (ref 26.0–34.0)
MCHC: 35.5 g/dL (ref 30.0–36.0)
MCV: 88.8 fL (ref 80.0–100.0)
Platelets: 220 10*3/uL (ref 150–400)
RBC: 3.3 MIL/uL — ABNORMAL LOW (ref 4.22–5.81)
RDW: 13 % (ref 11.5–15.5)
WBC: 11.2 10*3/uL — ABNORMAL HIGH (ref 4.0–10.5)
nRBC: 0 % (ref 0.0–0.2)

## 2019-06-30 LAB — BASIC METABOLIC PANEL
Anion gap: 10 (ref 5–15)
BUN: 12 mg/dL (ref 8–23)
CO2: 24 mmol/L (ref 22–32)
Calcium: 8.5 mg/dL — ABNORMAL LOW (ref 8.9–10.3)
Chloride: 99 mmol/L (ref 98–111)
Creatinine, Ser: 0.78 mg/dL (ref 0.61–1.24)
GFR calc Af Amer: >60 mL/min (ref >60)
GFR calc non Af Amer: >60 mL/min (ref >60)
Glucose, Bld: 110 mg/dL — ABNORMAL HIGH (ref 70–99)
Potassium: 3.5 mmol/L (ref 3.5–5.1)
Sodium: 133 mmol/L — ABNORMAL LOW (ref 135–145)

## 2019-06-30 MED ORDER — METHOCARBAMOL 500 MG PO TABS
1000.0000 mg | ORAL_TABLET | Freq: Three times a day (TID) | ORAL | Status: DC
  Administered 2019-06-30 – 2019-07-01 (×3): 1000 mg via ORAL
  Filled 2019-06-30 (×3): qty 2

## 2019-06-30 MED ORDER — DIVALPROEX SODIUM 500 MG PO DR TAB
500.0000 mg | DELAYED_RELEASE_TABLET | Freq: Two times a day (BID) | ORAL | Status: DC
  Administered 2019-06-30 – 2019-07-01 (×3): 500 mg via ORAL
  Filled 2019-06-30 (×3): qty 1

## 2019-06-30 MED ORDER — POLYETHYLENE GLYCOL 3350 17 G PO PACK
17.0000 g | PACK | Freq: Every day | ORAL | Status: DC
  Administered 2019-06-30 – 2019-07-01 (×2): 17 g via ORAL
  Filled 2019-06-30 (×2): qty 1

## 2019-06-30 MED ORDER — PANTOPRAZOLE SODIUM 40 MG PO TBEC
40.0000 mg | DELAYED_RELEASE_TABLET | Freq: Every day | ORAL | Status: DC
  Administered 2019-07-01: 40 mg via ORAL
  Filled 2019-06-30: qty 1

## 2019-06-30 MED ORDER — ASPIRIN 325 MG PO TABS
325.0000 mg | ORAL_TABLET | Freq: Every day | ORAL | Status: DC
  Administered 2019-06-30 – 2019-07-01 (×2): 325 mg via ORAL
  Filled 2019-06-30 (×2): qty 1

## 2019-06-30 MED ORDER — GABAPENTIN 400 MG PO CAPS
400.0000 mg | ORAL_CAPSULE | Freq: Three times a day (TID) | ORAL | Status: DC
  Administered 2019-06-30 – 2019-07-01 (×4): 400 mg via ORAL
  Filled 2019-06-30 (×4): qty 1

## 2019-06-30 NOTE — Progress Notes (Signed)
Patient suffers from multiple lumbar spine transverse process fractures and dizziness which impairs their ability to perform daily activities like toileting in the home. A cane, crutch or walker will not resolve issue with performing activities of daily living. A wheelchair will allow patient to safely perform daily activities. Patient can safely propel the wheelchair in the home or has a caregiver who can provide assistance. Length of need 12 months .  Accessories: elevating leg rests (ELRs), wheel locks, extensions and anti-tippers.  Wellington Hampshire, [facility] 06/30/2019, 3:39 PM Please see Amion for pager number during day hours 7:00am-4:30pm

## 2019-06-30 NOTE — TOC Initial Note (Signed)
Transition of Care [facility]) - Initial/Assessment Note    Patient Details  Name: William Jordan MRN: [id number] Date of Birth: 05/27/1946  Transition of Care [facility]) CM/SW Contact:    Ella Bodo, RN Phone Number: 06/30/2019, 1:46 PM  Clinical Narrative:  73 year old gentleman with numerous medical problems which include chronic pain, weakness and neuropathy with recent h/o frequent falls despite use of a walker.  He had his "worst fall "yet today, when he tripped over a piece of furniture in the living room and fell on his back.  Found to have L1-4 transverse process fx and Retroperitoneal hematoma with incidental finding of possible R renal cell carcinoma. PTA, pt required assistance with ADLS, lives at home with spouse, who can provide 24h care.  He uses RW to ambulate.  PT recommending [place] follow up, and pt agreeable to services.  PT initially recommending SNF on 10/14, but pt/spouse refusing due to [place].  Await OT recommendations; will arrange [place] follow up as recommended.   SBIRT completed; pt denies ETOH use or need for cessation resources.                Expected Discharge Plan: [place] Barriers to Discharge: Continued Medical Work up   Patient Goals and CMS Choice   CMS Medicare.gov Compare Post Acute Care list provided to:: Patient Choice offered to / list presented to : Spouse, Patient  Expected Discharge Plan and Services Expected Discharge Plan: [place]   Discharge Planning Services: CM Consult Post Acute Care Choice: [place] arrangements for the past 2 months: Single Family Home                                      Prior Living Arrangements/Services Living arrangements for the past 2 months: Single Family Home Lives with:: Spouse Patient language and need for interpreter reviewed:: Yes Do you feel safe going back to the place where you live?: Yes      Need for Family Participation in Patient Care: Yes  (Comment) Care giver support system in place?: Yes (comment) Current home services: DME(RW, BSC, cane) Criminal Activity/Legal Involvement Pertinent to Current Situation/Hospitalization: No - Comment as needed  Activities of Daily Living Home Assistive Devices/Equipment: None ADL Screening (condition at time of admission) Patient's cognitive ability adequate to safely complete daily activities?: Yes Is the patient deaf or have difficulty hearing?: No Does the patient have difficulty seeing, even when wearing glasses/contacts?: No Does the patient have difficulty concentrating, remembering, or making decisions?: No Patient able to express need for assistance with ADLs?: Yes Does the patient have difficulty dressing or bathing?: No Independently performs ADLs?: Yes (appropriate for developmental age) Does the patient have difficulty walking or climbing stairs?: No Weakness of Legs: Both Weakness of Arms/Hands: None                 Emotional Assessment Appearance:: Appears stated age Attitude/Demeanor/Rapport: Engaged Affect (typically observed): Appropriate Orientation: : Oriented to Self, Oriented to Place, Oriented to  Time, Oriented to Situation Alcohol / Substance Use: Not Applicable Psych Involvement: No (comment)  Admission diagnosis:  Pain [R52] Fall, initial encounter [W19.XXXA] Traumatic retroperitoneal hematoma, initial encounter [id number] Other closed fracture of first lumbar vertebra, initial encounter [facility]) [S32.018A] Patient Active Problem List   Diagnosis Date Noted  . Traumatic retroperitoneal hematoma 06/28/2019  . Mild cognitive impairment   .  Acute encephalopathy 11/06/2018  . Left leg swelling 05/16/2018  . Alcohol abuse 05/16/2018  . Depression 05/16/2018  . Toxic encephalopathy   . Marijuana abuse   . Encephalopathy 05/15/2018  . Gait disturbance, post-stroke   . History of CVA (cerebrovascular accident) 05/04/2018  . Spastic hemiparesis of left  nondominant side due to acute cerebral infarction ([place])   . 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[place])   . Essential hypertension   . Hyponatremia   . Non-ST elevation (NSTEMI) myocardial infarction ([place])   . Ventricular tachycardia ([place])   . CVA (cerebral vascular accident) ([place]) 04/25/2018  . Angioedema 04/18/2018  . History of right inguinal hernia repair 02/10/2018  . Acute kidney injury ([place]) 12/10/2017  . Urinary retention 04/25/2016  . Acute renal failure syndrome ([place])   . Low back pain with sciatica 04/03/2015  . H/O total hip arthroplasty 04/03/2015  . Idiopathic peripheral neuropathy 09/29/2014  . Chronic pain 09/28/2014  . DDD (degenerative disc disease), lumbar 12/30/2012  . Degenerative arthritis of thoracic spine 12/30/2012  . Localized osteoarthrosis 12/13/2012  . Chronic pain associated with significant psychosocial dysfunction 12/13/2012  . Polypharmacy 12/13/2012  . Long term current use of opiate analgesic 12/13/2012   PCP:  Townsend Roger, MD Pharmacy:   [facility] [place], [place] - [address] [place] [address] Phone: [phone] Fax: [phone]  Reinaldo Raddle, RN, BSN  Trauma/Neuro ICU Case Manager [phone]

## 2019-06-30 NOTE — Progress Notes (Signed)
[facility]/Trauma Progress Note      Assessment/Plan Hx CVA - holding plavix Hx of MI - restart ASA, continue cardiac monitoring  Arthritis  HTN - home meds GOUT - home meds  Fall Retroperitoneal hematoma without extravasation - hgb 10.4, stable, am labs, if ok will restart plavix L1-L4 TPF - brace when up per NS Incidental finding, likely R renal cell carcinoma - discussed with patient, outpatient follow up HTN - home meds Chronic pain - home regimen is 20 mg oxycodone q8h prn, gabapentin   FEN: reg diet VTE: SCDs, holding plavix, restart ASA Foley: condom bath ID: no abx indicated Follow up: NS, Nephrology   Dispo: trend hgb. Pain control. PT recs SNF but pt would prefer HH, will await OT recs. AM labs  Wife at bedside. Spoke with her. They live together and she will be able to help him at home.    LOS: 0 days    Subjective: CC: back pain  Pt states he is getting up but he is having a lot of pain. He denies nausea, vomiting, fever or chills. His R side abdomen/flank hurts. His wife is at bedside. She also would like for him to have [place] as opposed to SNF. They are both concerned about COVID.   Objective: Vital signs in last 24 hours: Temp:  [97.4 F (36.3 C)-98.1 F (36.7 C)] 98.1 F (36.7 C) (10/15 0842) Pulse Rate:  [70-91] 84 (10/15 0842) Resp:  [12-24] 24 (10/15 0842) BP: (115-174)/(76-94) 171/81 (10/15 0842) SpO2:  [97 %-100 %] 99 % (10/15 0842) Weight:  [83.3 kg] 83.3 kg (10/14 2351) Last BM Date: 06/28/19  Intake/Output from previous day: No intake/output data recorded. Intake/Output this shift: No intake/output data recorded.  PE: Gen:  Alert, NAD, pleasant, cooperative Card:  RRR, no M/G/R heard, 2 + L PT, 1+ R PT pulses Pulm:  CTA, no W/R/R, rate and effort normal Abd: Soft, ND, +BS, generalized TTP worse of R hemiabdomen Extremities: No BLE edema Neuro: moves all 4's, no gross motor or sensory deficits Skin: no rashes noted,  warm and dry   Anti-infectives: Anti-infectives (From admission, onward)   None      Lab Results:  Recent Labs    06/29/19 1721 06/30/19 0050  WBC 11.0* 11.2*  HGB 11.1* 10.4*  HCT 31.8* 29.3*  PLT 210 220   BMET Recent Labs    06/29/19 0343 06/30/19 0050  NA 134* 133*  K 3.6 3.5  CL 98 99  CO2 24 24  GLUCOSE 113* 110*  BUN 14 12  CREATININE 0.88 0.78  CALCIUM 9.0 8.5*   PT/INR Recent Labs    06/28/19 2214  LABPROT 14.2  INR 1.1   CMP     Component Value Date/Time   NA 133 (L) 06/30/2019 0050   K 3.5 06/30/2019 0050   CL 99 06/30/2019 0050   CO2 24 06/30/2019 0050   GLUCOSE 110 (H) 06/30/2019 0050   BUN 12 06/30/2019 0050   CREATININE 0.78 06/30/2019 0050   CALCIUM 8.5 (L) 06/30/2019 0050   PROT 7.1 06/28/2019 1944   ALBUMIN 4.1 06/28/2019 1944   AST 27 06/28/2019 1944   ALT 18 06/28/2019 1944   ALKPHOS 65 06/28/2019 1944   BILITOT 0.7 06/28/2019 1944   GFRNONAA >60 06/30/2019 0050   GFRAA >60 06/30/2019 0050   Lipase     Component Value Date/Time   LIPASE 17 11/05/2018 2118    Studies/Results: Ct Abdomen Pelvis W Contrast  Result Date:  06/28/2019 CLINICAL DATA:  [place] with weakness and abdominal pain. EXAM: CT ABDOMEN AND PELVIS WITH CONTRAST TECHNIQUE: Multidetector CT imaging of the abdomen and pelvis was performed using the standard protocol following bolus administration of intravenous contrast. CONTRAST:  138mL OMNIPAQUE IOHEXOL 300 MG/ML  SOLN COMPARISON:  12/10/2017 FINDINGS: Lower chest: Lung bases are clear.  No pleural or pericardial fluid. Hepatobiliary: Normal Pancreas: Normal Spleen: Normal Adrenals/Urinary Tract: Adrenal glands are normal. There is enlargement of a solid mass in the lower pole of the right kidney measuring almost 5 cm in diameter, enlarged since last year's examination and highly likely to represent a renal cell carcinoma. Simple cyst noted of the left kidney. Bladder is normal. Stomach/Bowel: No acute or primary  bowel pathology. Vascular/Lymphatic: Aortic atherosclerosis. No aneurysm. IVC is normal. Severe stenosis of the right common iliac artery with near occlusion. No retroperitoneal adenopathy. Reproductive: Normal Other: Right-sided retroperitoneal hematoma both within the retroperitoneal fat and the iliopsoas musculature. This is likely a result the right-sided transverse process fractures from L1 through L4. No puddle sign to suggest ongoing acute rapid extravasation. Musculoskeletal: Right transverse process fractures from L1 through L4 as noted above, responsible for the retroperitoneal hemorrhage on the right. Advanced chronic degenerative changes of the spine with chronic loss of height of the lower thoracic and upper lumbar vertebral bodies. IMPRESSION: Transverse process fractures on the right from L1 through L4 with moderate to large right-sided retroperitoneal hemorrhage both within the retroperitoneal fat and within the iliopsoas musculature. Enlarging 5 cm mass in the lower pole of the right kidney likely to represent renal cell carcinoma. Atherosclerosis of the aorta and its branch vessels, including near occlusion of the right common iliac artery. Electronically Signed   By: Nelson Chimes M.D.   On: 06/28/2019 21:33   Dg Chest Port 1 View  Result Date: 06/28/2019 CLINICAL DATA:  73 year old male status post trip and fall on back yesterday. EXAM: PORTABLE CHEST 1 VIEW COMPARISON:  Chest radiographs 11/05/2018 and earlier. FINDINGS: Portable AP semi upright view at 2343 hours. Chronically low lung volumes, improved today. Chronic left chest cardiac event recorder and thoracic spinal stimulator. Chronic left shoulder arthroplasty. Allowing for portable technique the lungs are clear. Mediastinal contours and tracheal air column within normal limits. Negative visible bowel gas pattern. Chronic right clavicle deformity. Possible chronic posterior left 5th and 6th rib fractures appear stable. No acute  osseous abnormality identified. IMPRESSION: No acute cardiopulmonary abnormality or acute traumatic injury identified. Electronically Signed   By: Genevie Ann M.D.   On: 06/28/2019 23:54   Dg Hip Unilat With Pelvis 2-3 Views Right  Result Date: 06/28/2019 CLINICAL DATA:  Right hip pain. Previous right total hip arthroplasty. EXAM: DG HIP (WITH OR WITHOUT PELVIS) 2-3V RIGHT COMPARISON:  CT scan of the abdomen and pelvis dated 12/10/2017 FINDINGS: There is no evidence of fracture or bone destruction. The components of the right total hip prosthesis appear in good position with no evidence of loosening. No acute abnormality of the pelvic bones. Neurostimulator generator overlies the right iliac bone. Aortic atherosclerosis. IMPRESSION: 1. No acute abnormalities of the pelvis or right hip. 2. Aortic atherosclerosis. Electronically Signed   By: Lorriane Shire M.D.   On: 06/28/2019 15:24     Kalman Drape, [facility] Pager [phone] Cristine Pol[place], & Friday 7:00am - 4:30pm Thursdays 7:00am -11:30am

## 2019-06-30 NOTE — TOC Progression Note (Signed)
Transition of Care [facility]) - Progression Note    Patient Details  Name: William Jordan MRN: [id number] Date of Birth: 1946/06/17  [facility]) CM/SW Contact  [place] Cleta Alberts, RN Phone Number: 06/30/2019, 4:48 PM  Clinical Narrative:  PT/OT recommending [place] follow up, WC for home.  Referral to [place] for [facility] needs, per pt/wife choice.  Referral to [place] for DME needs.      Expected Discharge Plan: [place] Barriers to Discharge: Continued Medical Work up  Expected Discharge Plan and Services Expected Discharge Plan: [place]   Discharge Planning Services: CM Consult Post Acute Care Choice: [place] arrangements for the past 2 months: Single Family Home                 DME Arranged: Wheelchair manual DME Agency: [employer] Date DME Agency Contacted: 06/30/19 Time DME Agency Contacted: [phone] Representative spoke with at DME Agency: [place]: PT, OT [place] Agency: [place] Date [place]: 06/30/19 Time [place]: 1648 Representative spoke with at [place]: Heath Lark, RN, BSN  Trauma/Neuro ICU Case Manager [phone]

## 2019-06-30 NOTE — Progress Notes (Addendum)
Physical Therapy Treatment Patient Details Name: William Jordan MRN: [id number] DOB: 27-Apr-1946 Today's Date: 06/30/2019    History of Present Illness 73 year old gentleman with numerous medical problems which include chronic pain, weakness and neuropathy with recent h/o frequent falls despite use of a walker.  He had his "worst fall "yet today, when he tripped over a piece of furniture in the living room and fell on his back.  Found to have L1-4 transverse process fx and Retroperitoneal hematoma with incidental finding of possible R renal cell carcinoma.    PT Comments    Patient very energetic (almost manic-like) with fast talking and some impulsivity. Reported brief dizziness upon intial standing and orthostatic BPs were normal. No reports of dizziness while walking, do ADLs at sink. Once seated reported dizziness and with further questioning sounds as if pt may have BPPV (?horizontal canal) as he reports head spinning dizziness and immediate fall if he turns his head when standing. Will further assess for vestibular issues on next visit (or [place] can address, if discharged). Wife arrived mid-way through session and OK with updating plan to [place].     Follow Up Recommendations  Supervision/Assistance - 24 hour;Home health PT (vestibular rehab)    Equipment Recommendations  None recommended by PT    Recommendations for Other Services       Precautions / Restrictions Precautions Precautions: Fall;Back Precaution Booklet Issued: No Required Braces or Orthoses: Spinal Brace Spinal Brace: Lumbar corset;Applied in sitting position Restrictions Weight Bearing Restrictions: No    Mobility  Bed Mobility Overal bed mobility: Needs Assistance Bed Mobility: Rolling;Sidelying to Sit Rolling: Min assist Sidelying to sit: Min assist       General bed mobility comments: on hospital bed; required max cues for technique to roll and come up from his side; assist to roll due to pain and stops  moving; assist to elevate trunk to upright sitting  Transfers Overall transfer level: Needs assistance Equipment used: Rolling walker (2 wheeled)(holding back of chair) Transfers: Sit to/from Stand Sit to Stand: Min assist;From elevated surface         General transfer comment: Donned LSO in sitting EOB; vc for sequence/use of RW; steadying assist and cues to stand for a bit before trying to walk; assessed BP and not orthostatic  Ambulation/Gait Ambulation/Gait assistance: Min assist;+2 safety/equipment Gait Distance (Feet): 16 Feet Assistive device: Rolling walker (2 wheeled) Gait Pattern/deviations: Step-through pattern;Decreased stride length;Staggering left;Staggering right;Wide base of support Gait velocity: decr   General Gait Details: moves impulsively at times; can verbalize that he falls whenever he turns his head to look at someone, so he has stopped turning his head   Stairs             Wheelchair Mobility    Modified Rankin (Stroke Patients Only)       Balance Overall balance assessment: Needs assistance Sitting-balance support: Feet supported;No upper extremity supported Sitting balance-Leahy Scale: Fair     Standing balance support: Single extremity supported;During functional activity Standing balance-Leahy Scale: Poor Standing balance comment: while at sink brushing his teeth, used other hand on counter to stabilize; noted pt with Rt knee flexed in stance--he reports his LLE is 3 inches shorter than his rt (due to multiple fractures and surgeries)                            Cognition Arousal/Alertness: Awake/alert Behavior During Therapy: Impulsive;Anxious Overall Cognitive Status: No family/caregiver present to determine  baseline cognitive functioning                                 General Comments: some word-finding issues noted; speaks constantly and quickly changes topic; wife arrived at very end of session and did  not seem he was different than usual per her reaction (not specifically discussed)      Exercises      General Comments General comments (skin integrity, edema, etc.): Pt reported dizziness upon intial standing and then not again until her was reclined in the recliner. At first described as a foggy feeling (?meds), then denied sense of movement, then stated it was more a passing out feeling, and then stated it does feel like his head is "going around and around" BP stable from earlier measures. Potentially has BPPV and discussed this with pt/wife. Will attempt to complete vestibular evaluation while hospitalized, however can also be done by HHPT      Pertinent Vitals/Pain Pain Assessment: 0-10 Pain Score: 8  Pain Location: rt hip, abdomen Pain Descriptors / Indicators: Grimacing;Guarding;Aching;Sore;Sharp Pain Intervention(s): Limited activity within patient's tolerance;Monitored during session;Premedicated before session;Repositioned    Home Living               Home Equipment: Walker - 4 wheels;Hand held shower head;Grab bars - tub/shower;Shower seat - built in;Grab bars - toilet;Bedside commode;Cane - single point      Prior Function            PT Goals (current goals can now be found in the care plan section) Acute Rehab PT Goals Patient Stated Goal: to stop falling Time For Goal Achievement: 07/13/19 Potential to Achieve Goals: Good Progress towards PT goals: Progressing toward goals    Frequency    Min 3X/week      PT Plan Discharge plan needs to be updated    Co-evaluation PT/OT/SLP Co-Evaluation/Treatment: Yes Reason for Co-Treatment: Complexity of the patient's impairments (multi-system involvement);For patient/therapist safety PT goals addressed during session: Mobility/safety with mobility;Balance;Proper use of DME OT goals addressed during session: ADL's and self-care      AM-PAC PT "6 Clicks" Mobility   Outcome Measure  Help needed turning from  your back to your side while in a flat bed without using bedrails?: A Little Help needed moving from lying on your back to sitting on the side of a flat bed without using bedrails?: A Little Help needed moving to and from a bed to a chair (including a wheelchair)?: A Little Help needed standing up from a chair using your arms (e.g., wheelchair or bedside chair)?: A Little Help needed to walk in hospital room?: A Little Help needed climbing 3-5 steps with a railing? : A Lot 6 Click Score: 17    End of Session Equipment Utilized During Treatment: Back brace;Gait belt Activity Tolerance: Other (comment);Patient tolerated treatment well(until very end of session when reported dizziness while seat) Patient left: in chair;with call bell/phone within reach;with chair alarm set;with family/visitor present(wife) Nurse Communication: Mobility status;Other (comment)(?BPPV; TBA) PT Visit Diagnosis: Other abnormalities of gait and mobility (R26.89);History of falling (Z91.81);Pain Pain - Right/Left: Right Pain - part of body: Hip     Time: [id number] PT Time Calculation (min) (ACUTE ONLY): 40 min  Charges:  $Gait Training: 8-22 mins                       Barry Brunner, PT  Jeanie Cooks [place] B[place]y 06/30/2019, 11:50 AM

## 2019-06-30 NOTE — Evaluation (Addendum)
Occupational Therapy Evaluation Patient Details Name: William Jordan MRN: [id number] DOB: [birth year] Today's Date: 06/30/2019    History of Present Illness 73 year old gentleman with numerous medical problems which include chronic pain, weakness and neuropathy with recent h/o frequent falls despite use of a walker.  He had his "worst fall "yet today, when he tripped over a piece of furniture in the living room and fell on his back.  Found to have L1-4 transverse process fx and Retroperitoneal hematoma with incidental finding of possible R renal cell carcinoma.   Clinical Impression   Pt admitted with above diagnoses, presenting with pain, generalized weakness, and new back precautions limiting ability to complete BADL at desired level of ind. PTA, pt living at home with wife and endorses several falls (3 in past week) despite use of RW. At time of eval, he is min A- min A +2 for functional mobility and transfers. Pt able to stand at sink at min guard to complete grooming tasks with cues for maintaining back precautions. He is overall limited by dizziness which is suspect to be BPPV per PT vestibular therapist. He also appears to have some cognitive/psychosocial deficits, very tangential and theatrical. Pt will benefit from [facility] to maximize home safety, increase BADL independence, and reduce falls. Will continue to follow per POC listed below.     Follow Up Recommendations  Home health OT;Supervision/Assistance - 24 hour    Equipment Recommendations  Wheelchair (measurements OT);Wheelchair cushion (measurements OT)    Recommendations for Other Services       Precautions / Restrictions Precautions Precautions: Fall;Back Precaution Booklet Issued: No Required Braces or Orthoses: Spinal Brace Spinal Brace: Lumbar corset;Applied in sitting position Restrictions Weight Bearing Restrictions: No      Mobility Bed Mobility Overal bed mobility: Needs Assistance Bed Mobility: Rolling;Sidelying  to Sit Rolling: Min assist Sidelying to sit: Min assist       General bed mobility comments: multimodal cueing needed for log roll technique. Assist for BLE translation to EOB and to rise at trunk  Transfers Overall transfer level: Needs assistance Equipment used: Rolling walker (2 wheeled) Transfers: Sit to/from Stand Sit to Stand: Min assist;From elevated surface         General transfer comment: vc for safe hand placement, min A to rise and steady    Balance Overall balance assessment: Needs assistance Sitting-balance support: Feet supported;No upper extremity supported Sitting balance-Leahy Scale: Fair Sitting balance - Comments: somewhat ltd by pain and dizziness   Standing balance support: Single extremity supported;During functional activity Standing balance-Leahy Scale: Poor Standing balance comment: while at sink brushing his teeth, used other hand on counter to stabilize; noted pt with Rt knee flexed in stance--he reports his LLE is 3 inches shorter than his rt (due to multiple fractures and surgeries)                           ADL either performed or assessed with clinical judgement   ADL Overall ADL's : Needs assistance/impaired Eating/Feeding: Set up;Sitting   Grooming: Min guard;Standing Grooming Details (indicate cue type and reason): cues to use cup and adhere to back precautions Upper Body Bathing: Minimal assistance;Sitting   Lower Body Bathing: Moderate assistance;Sit to/from stand;Sitting/lateral leans;Adhering to back precautions   Upper Body Dressing : Moderate assistance;Sitting Upper Body Dressing Details (indicate cue type and reason): to don brace in sitting Lower Body Dressing: Moderate assistance;Sitting/lateral leans;Sit to/from stand;Adhering to back precautions Lower Body Dressing Details (indicate  cue type and reason): ltd 2/2 pain Toilet Transfer: Minimal assistance;+2 for safety/equipment;Regular Toilet;Grab  bars;Ambulation;RW   Toileting- Clothing Manipulation and Hygiene: Minimal assistance;Adhering to back precautions;Sitting/lateral lean;Sit to/from stand   Tub/ Shower Transfer: Minimal assistance;+2 for safety/equipment;+2 for physical assistance;Shower seat;Rolling walker   Functional mobility during ADLs: Minimal assistance;+2 for safety/equipment;+2 for physical assistance;Rolling walker General ADL Comments: pt ltd 2/2 pain, new precuations and generalized weakness     Vision Baseline Vision/History: Wears glasses Wears Glasses: At all times(not present at eval) Vision Assessment?: No apparent visual deficits Additional Comments: reports dizziness, suspect possible BPPV per vestibular PT who is going to f/u for further testing     Perception     Praxis      Pertinent Vitals/Pain Pain Assessment: 0-10 Pain Score: 8  Pain Location: rt hip, abdomen Pain Descriptors / Indicators: Grimacing;Guarding;Aching;Sore;Sharp Pain Intervention(s): Limited activity within patient's tolerance;Monitored during session;Premedicated before session;Repositioned     Hand Dominance Right   Extremity/Trunk Assessment Upper Extremity Assessment Upper Extremity Assessment: Generalized weakness   Lower Extremity Assessment Lower Extremity Assessment: Defer to PT evaluation       Communication Communication Communication: No difficulties   Cognition Arousal/Alertness: Awake/alert Behavior During Therapy: Impulsive;Anxious Overall Cognitive Status: No family/caregiver present to determine baseline cognitive functioning                                 General Comments: some word finding issues, very tangential and chagnes topics frequently. Wife arrived at end of session, did not specifically state that this was different. Suspect anxiety due to pain   General Comments       Exercises     Shoulder Instructions      Home Living Family/patient expects to be discharged  to:: Private residence Living Arrangements: Spouse/significant other Available Help at Discharge: Family;Available 24 hours/day Type of Home: House Home Access: Stairs to enter [employer] of Steps: 3 Entrance Stairs-Rails: Right;Left Home Layout: One level     Bathroom Shower/Tub: Occupational psychologist: Handicapped height     Home Equipment: Environmental consultant - 4 wheels;Hand held shower head;Grab bars - tub/shower;Shower seat - built in;Grab bars - toilet;Bedside commode;Cane - single point          Prior Functioning/Environment Level of Independence: Independent with assistive device(s)        Comments: pt assist with IADLs as needed. Pt engages in BADLs with set up. Does have hx of multiple falls        OT Problem List: Decreased strength;Pain;Decreased activity tolerance;Decreased safety awareness;Impaired balance (sitting and/or standing);Decreased knowledge of use of DME or AE;Decreased knowledge of precautions      OT Treatment/Interventions: Self-care/ADL training;Therapeutic exercise;Therapeutic activities;Energy conservation;DME and/or AE instruction;Patient/family education;Balance training    OT Goals(Current goals can be found in the care plan section) Acute Rehab OT Goals Patient Stated Goal: to stop falling OT Goal Formulation: With patient Time For Goal Achievement: 07/14/19 Potential to Achieve Goals: Good  OT Frequency: Min 2X/week   Barriers to D/C:            Co-evaluation PT/OT/SLP Co-Evaluation/Treatment: Yes Reason for Co-Treatment: Complexity of the patient's impairments (multi-system involvement);For patient/therapist safety PT goals addressed during session: Mobility/safety with mobility;Balance;Proper use of DME OT goals addressed during session: ADL's and self-care      AM-PAC OT "6 Clicks" Daily Activity     Outcome Measure Help from another person eating meals?: A Little  Help from another person taking care of personal  grooming?: A Little Help from another person toileting, which includes using toliet, bedpan, or urinal?: A Little Help from another person bathing (including washing, rinsing, drying)?: A Lot Help from another person to put on and taking off regular upper body clothing?: A Little Help from another person to put on and taking off regular lower body clothing?: A Lot 6 Click Score: 16   End of Session Equipment Utilized During Treatment: Gait belt;Rolling walker Nurse Communication: Mobility status;Precautions  Activity Tolerance: Patient tolerated treatment well Patient left: in chair;with call bell/phone within reach;with chair alarm set;with family/visitor present  OT Visit Diagnosis: Unsteadiness on feet (R26.81);Other abnormalities of gait and mobility (R26.89);Repeated falls (R29.6);Muscle weakness (generalized) (M62.81);History of falling (Z91.81);Pain Pain - part of body: (back)                Time: [id number] OT Time Calculation (min): 39 min Charges:  OT General Charges $OT Visit: 1 Visit OT Evaluation $OT Eval Moderate Complexity: 1 Mod OT Treatments $Self Care/Home Management : 8-22 mins  Zenovia Jarred, MSOT, OTR/L Behavioral Health OT/ Acute Relief OT [facility] Office: [place] 06/30/2019, 2:03 PM

## 2019-07-01 DIAGNOSIS — W010XXA Fall on same level from slipping, tripping and stumbling without subsequent striking against object, initial encounter: Secondary | ICD-10-CM | POA: Diagnosis present

## 2019-07-01 DIAGNOSIS — R296 Repeated falls: Secondary | ICD-10-CM | POA: Diagnosis present

## 2019-07-01 DIAGNOSIS — Z20828 Contact with and (suspected) exposure to other viral communicable diseases: Secondary | ICD-10-CM | POA: Diagnosis present

## 2019-07-01 DIAGNOSIS — S32048A Other fracture of fourth lumbar vertebra, initial encounter for closed fracture: Secondary | ICD-10-CM | POA: Diagnosis present

## 2019-07-01 DIAGNOSIS — Y92008 Other place in unspecified non-institutional (private) residence as the place of occurrence of the external cause: Secondary | ICD-10-CM | POA: Diagnosis not present

## 2019-07-01 DIAGNOSIS — S32018A Other fracture of first lumbar vertebra, initial encounter for closed fracture: Secondary | ICD-10-CM | POA: Diagnosis present

## 2019-07-01 DIAGNOSIS — M545 Low back pain: Secondary | ICD-10-CM | POA: Diagnosis present

## 2019-07-01 DIAGNOSIS — S32038A Other fracture of third lumbar vertebra, initial encounter for closed fracture: Secondary | ICD-10-CM | POA: Diagnosis present

## 2019-07-01 DIAGNOSIS — Z7902 Long term (current) use of antithrombotics/antiplatelets: Secondary | ICD-10-CM | POA: Diagnosis not present

## 2019-07-01 DIAGNOSIS — Z7982 Long term (current) use of aspirin: Secondary | ICD-10-CM | POA: Diagnosis not present

## 2019-07-01 DIAGNOSIS — M109 Gout, unspecified: Secondary | ICD-10-CM | POA: Diagnosis present

## 2019-07-01 DIAGNOSIS — S36892A Contusion of other intra-abdominal organs, initial encounter: Secondary | ICD-10-CM | POA: Diagnosis present

## 2019-07-01 DIAGNOSIS — G629 Polyneuropathy, unspecified: Secondary | ICD-10-CM | POA: Diagnosis present

## 2019-07-01 DIAGNOSIS — S32028A Other fracture of second lumbar vertebra, initial encounter for closed fracture: Secondary | ICD-10-CM | POA: Diagnosis present

## 2019-07-01 DIAGNOSIS — G8929 Other chronic pain: Secondary | ICD-10-CM | POA: Diagnosis present

## 2019-07-01 DIAGNOSIS — Z8249 Family history of ischemic heart disease and other diseases of the circulatory system: Secondary | ICD-10-CM | POA: Diagnosis not present

## 2019-07-01 DIAGNOSIS — F17290 Nicotine dependence, other tobacco product, uncomplicated: Secondary | ICD-10-CM | POA: Diagnosis present

## 2019-07-01 DIAGNOSIS — I10 Essential (primary) hypertension: Secondary | ICD-10-CM | POA: Diagnosis present

## 2019-07-01 DIAGNOSIS — I252 Old myocardial infarction: Secondary | ICD-10-CM | POA: Diagnosis not present

## 2019-07-01 DIAGNOSIS — Z8673 Personal history of transient ischemic attack (TIA), and cerebral infarction without residual deficits: Secondary | ICD-10-CM | POA: Diagnosis not present

## 2019-07-01 DIAGNOSIS — Z79899 Other long term (current) drug therapy: Secondary | ICD-10-CM | POA: Diagnosis not present

## 2019-07-01 LAB — CBC
HCT: 32.9 % — ABNORMAL LOW (ref 39.0–52.0)
Hemoglobin: 11.1 g/dL — ABNORMAL LOW (ref 13.0–17.0)
MCH: 30.7 pg (ref 26.0–34.0)
MCHC: 33.7 g/dL (ref 30.0–36.0)
MCV: 90.9 fL (ref 80.0–100.0)
Platelets: 304 10*3/uL (ref 150–400)
RBC: 3.62 MIL/uL — ABNORMAL LOW (ref 4.22–5.81)
RDW: 13 % (ref 11.5–15.5)
WBC: 10.4 10*3/uL (ref 4.0–10.5)
nRBC: 0 % (ref 0.0–0.2)

## 2019-07-01 MED ORDER — DOCUSATE SODIUM 100 MG PO CAPS: 100.0000 mg | ORAL_CAPSULE | Freq: Two times a day (BID) | ORAL | 0 refills | Status: AC

## 2019-07-01 MED ORDER — POLYETHYLENE GLYCOL 3350 17 G PO PACK: 17.0000 g | PACK | Freq: Every day | ORAL | 0 refills | Status: AC

## 2019-07-01 MED ORDER — METHOCARBAMOL 500 MG PO TABS: 1000.0000 mg | ORAL_TABLET | Freq: Three times a day (TID) | ORAL | 0 refills | Status: DC | PRN

## 2019-07-01 NOTE — Progress Notes (Addendum)
[facility] Progress Note     Subjective: CC-  Up in chair this morning. Overall feeling well. States that the barometric pressure is better today and he feels like a new person.  Tolerating diet. Denies abdominal pain, n/v.  Objective: Vital signs in last 24 hours: Temp:  [97.7 F (36.5 C)-98.6 F (37 C)] 97.7 F (36.5 C) (10/16 0859) Pulse Rate:  [74-111] 111 (10/16 0859) Resp:  [9-24] 18 (10/16 0859) BP: (96-174)/(67-104) 140/90 (10/16 0859) SpO2:  [88 %-98 %] 88 % (10/16 0859) Last BM Date: 06/28/19  Intake/Output from previous day: 10/15 0701 - 10/16 0700 In: 480 [P.O.:480] Out: 2200 [Urine:2200] Intake/Output this shift: Total I/O In: 240 [P.O.:240] Out: 800 [Urine:800]  PE: Gen:  Alert, NAD, pleasant, cooperative Card:  RRR, feet WWP Pulm:  CTAB, no W/R/R, rate and effort normal Abd: Soft, ND, +BS, nontender Extremities: calves soft and nontender Neuro: moves all 4's, no gross motor or sensory deficits Skin: no rashes noted, warm and dry  Lab Results:  Recent Labs    06/29/19 1721 06/30/19 0050  WBC 11.0* 11.2*  HGB 11.1* 10.4*  HCT 31.8* 29.3*  PLT 210 220   BMET Recent Labs    06/29/19 0343 06/30/19 0050  NA 134* 133*  K 3.6 3.5  CL 98 99  CO2 24 24  GLUCOSE 113* 110*  BUN 14 12  CREATININE 0.88 0.78  CALCIUM 9.0 8.5*   PT/INR Recent Labs    06/28/19 2214  LABPROT 14.2  INR 1.1   CMP     Component Value Date/Time   NA 133 (L) 06/30/2019 0050   K 3.5 06/30/2019 0050   CL 99 06/30/2019 0050   CO2 24 06/30/2019 0050   GLUCOSE 110 (H) 06/30/2019 0050   BUN 12 06/30/2019 0050   CREATININE 0.78 06/30/2019 0050   CALCIUM 8.5 (L) 06/30/2019 0050   PROT 7.1 06/28/2019 1944   ALBUMIN 4.1 06/28/2019 1944   AST 27 06/28/2019 1944   ALT 18 06/28/2019 1944   ALKPHOS 65 06/28/2019 1944   BILITOT 0.7 06/28/2019 1944   GFRNONAA >60 06/30/2019 0050   GFRAA >60 06/30/2019 0050   Lipase     Component Value Date/Time   LIPASE 17 11/05/2018 2118       Studies/Results: No results found.  Anti-infectives: Anti-infectives (From admission, onward)   None       Assessment/Plan Hx CVA - holding plavix Hx of MI - ASA, continue cardiac monitoring  Arthritis  HTN - home meds GOUT - home meds  Fall Retroperitoneal hematoma without extravasation- hgb 10.4 (10/15), pending this AM, if ok will restart plavix L1-L4 TPF - brace when up per NS Incidental finding, likely R renal cell carcinoma- discussed with patient, outpatient follow up. Will send message to PCP HTN- home meds Chronic pain- home regimen is 20 mg oxycodone q8h prn, gabapentin, trazodone   FEN: reg diet VTE: SCDs, ASA, holding plavix Foley: condom cath ID: no abx indicated Follow up: NS, PCP  Dispo: If H/H stable patient will be ready for discharge. HH PT/OT and DME has been ordered. Patient has oxy at home from pain clinic, will send rx for robaxin.   LOS: 0 days    Wellington [place], [facility] 07/01/2019, 10:24 AM Please see Amion for pager number during day hours 7:00am-4:30pm

## 2019-07-01 NOTE — Discharge Summary (Signed)
[facility] Discharge Summary   Patient ID: William Jordan MRN: [id number] DOB/AGE: 10/21/1945 73 y.o.  Admit date: 06/28/2019 Discharge date: 07/01/2019  Admitting Diagnosis: Fall L1-4 transverse process fracture Retroperitoneal hematoma Likely right renal cell carcinoma  Discharge Diagnosis Patient Active Problem List   Diagnosis Date Noted  . Traumatic retroperitoneal hematoma 06/28/2019  . Mild cognitive impairment   . Acute encephalopathy 11/06/2018  . Left leg swelling 05/16/2018  . Alcohol abuse 05/16/2018  . Depression 05/16/2018  . Toxic encephalopathy   . Marijuana abuse   . Encephalopathy 05/15/2018  . Gait disturbance, post-stroke   . History of CVA (cerebrovascular accident) 05/04/2018  . Spastic hemiparesis of left nondominant side due to acute cerebral infarction ([place])   . 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[place])   . Essential hypertension   . Hyponatremia   . Non-ST elevation (NSTEMI) myocardial infarction ([place])   . Ventricular tachycardia ([place])   . CVA (cerebral vascular accident) ([place]) 04/25/2018  . Angioedema 04/18/2018  . History of right inguinal hernia repair 02/10/2018  . Acute kidney injury ([place]) 12/10/2017  . Urinary retention 04/25/2016  . Acute renal failure syndrome ([place])   . Low back pain with sciatica 04/03/2015  . H/O total hip arthroplasty 04/03/2015  . Idiopathic peripheral neuropathy 09/29/2014  . Chronic pain 09/28/2014  . DDD (degenerative disc disease), lumbar 12/30/2012  . Degenerative arthritis of thoracic spine 12/30/2012  . Localized osteoarthrosis 12/13/2012  . Chronic pain associated with significant psychosocial dysfunction 12/13/2012  . Polypharmacy 12/13/2012  . Long term current use of opiate analgesic 12/13/2012    Consultants None  Imaging: No results found.  Procedures None  Hospital Course:  William Jordan is a 73yo male with numerous medical problems including chronic pain, weakness/neuropathy, frequent falls,  and h/o CVA/MI on plavix and aspirin who presented to [facility] 10/13 after suffering a ground level fall.  Workup showed L1-4 transverse process fractures and Retroperitoneal hematoma.  Patient was admitted to the trauma service.  Plavix held upon admission. Hemoglobin monitored and stabilized. Neurosurgery was consulted for back injury and recommended nonoperative management with lumbar corset brace for comfort. Patient progressed well with therapies who ultimately recommended home health PT/OT when medically stable for discharge. On 07/01/19, the patient was ambulating well, pain well controlled, vital signs stable and felt stable for discharge home.  Plavix restarted upon discharge. Incidentally the patient was found to possibly have right renal cell carcinoma; he will follow up with his PCP to discuss further workup. Patient will follow up as below and knows to call with questions or concerns.    I have personally reviewed the patients medication history on the [place] controlled substance database.    Allergies as of 07/01/2019      Reactions   Ace Inhibitors Shortness Of Breath, Swelling, Other (See Comments)   Angioedema (throat and tongue became swollen)   Morphine Other (See Comments)   Causes confusion and "makes me crazy," per the patient   Sulfa Antibiotics Other (See Comments)   Unknown reaction, from childhood      Medication List    STOP taking these medications   vitamin B-12 100 MCG tablet Commonly known as: CYANOCOBALAMIN     TAKE these medications   acetaminophen 325 MG tablet Commonly known as: TYLENOL Take 2 tablets (650 mg total) by mouth every 4 (four) hours as needed for headache or mild pain.   amLODipine 10 MG tablet Commonly known as: NORVASC Take 10 mg by mouth daily.  aspirin 325 MG tablet Take 1 tablet (325 mg total) by mouth daily.   atorvastatin 40 MG tablet Commonly known as: LIPITOR Take 1 tablet (40 mg total) by mouth daily at 6 PM.   b complex  vitamins tablet Take 1 tablet by mouth daily.   cetirizine 10 MG tablet Commonly known as: ZYRTEC Take 1 tablet (10 mg total) by mouth daily.   cholecalciferol 1000 units tablet Commonly known as: VITAMIN D Take 2,000 Units by mouth daily.   citalopram 20 MG tablet Commonly known as: CELEXA Take 1 tablet (20 mg total) by mouth daily.   clopidogrel 75 MG tablet Commonly known as: PLAVIX Take 1 tablet (75 mg total) by mouth daily.   colchicine 0.6 MG tablet Take 0.6 mg by mouth 2 (two) times daily.   divalproex 500 MG DR tablet Commonly known as: DEPAKOTE Take 1 tablet (500 mg total) by mouth every 12 (twelve) hours.   docusate sodium 100 MG capsule Commonly known as: COLACE Take 1 capsule (100 mg total) by mouth 2 (two) times daily.   furosemide 20 MG tablet Commonly known as: LASIX Take 20 mg by mouth daily.   gabapentin 600 MG tablet Commonly known as: NEURONTIN Take 0.5 tablets (300 mg total) by mouth 3 (three) times daily. What changed: how much to take   magnesium gluconate 500 MG tablet Commonly known as: MAGONATE Take 500 mg by mouth 2 (two) times daily.   methocarbamol 500 MG tablet Commonly known as: ROBAXIN Take 2 tablets (1,000 mg total) by mouth every 8 (eight) hours as needed for muscle spasms.   omeprazole 20 MG capsule Commonly known as: PRILOSEC Take 20 mg by mouth daily.   Oxycodone HCl 20 MG Tabs Take 1 tablet (20 mg total) by mouth every 8 (eight) hours as needed. What changed: when to take this   polyethylene glycol 17 g packet Commonly known as: MIRALAX / GLYCOLAX Take 17 g by mouth daily. Start taking on: July 02, 2019   traZODone 50 MG tablet Commonly known as: DESYREL Take 50 mg by mouth at bedtime.            Durable Medical Equipment  (From admission, onward)         Start     Ordered   06/30/19 1440  For home use only DME standard manual wheelchair with seat cushion  Once    Comments: Patient suffers from  multiple lumbar spine transverse process fractures and dizziness which impairs their ability to perform daily activities like toileting in the home.  A cane, crutch or walker will not resolve issue with performing activities of daily living. A wheelchair will allow patient to safely perform daily activities. Patient can safely propel the wheelchair in the home or has a caregiver who can provide assistance. Length of need 12 months . Accessories: elevating leg rests (ELRs), wheel locks, extensions and anti-tippers.   06/30/19 [facility] [place] Follow up.   Specialty: Home Health Services Why: Physical and occupational therapists to follow up with you at home.  They will call you for an appointment.  Contact information: [address] [place] [address] [id number]        Townsend Roger, MD. Go on 07/05/2019.   Specialty: Internal Medicine Why: at 2:15pm  Discuss possible finding of renal cell carcinoma Contact information: [place] [address]  [id number]        Consuella Lose, MD. Call.   Specialty: Neurosurgery Why: Call to arrange follow up regarding back injury Contact information: [address] [place] [address] [phone]           Signed: Brigid Re , [facility] 07/01/2019, 1:33 PM Please see Amion for pager number during day hours 7:00am-4:30pm

## 2019-07-01 NOTE — Progress Notes (Signed)
Pt discharged to car with wife with wheelchair and belongings. AVS reviewed with patient and no questions asked. Brace on patient. Simmie Davies RN

## 2019-07-06 DIAGNOSIS — M1990 Unspecified osteoarthritis, unspecified site: Secondary | ICD-10-CM | POA: Diagnosis not present

## 2019-07-06 DIAGNOSIS — Z96642 Presence of left artificial hip joint: Secondary | ICD-10-CM | POA: Diagnosis not present

## 2019-07-06 DIAGNOSIS — S32019D Unspecified fracture of first lumbar vertebra, subsequent encounter for fracture with routine healing: Secondary | ICD-10-CM | POA: Diagnosis not present

## 2019-07-06 DIAGNOSIS — S32039D Unspecified fracture of third lumbar vertebra, subsequent encounter for fracture with routine healing: Secondary | ICD-10-CM | POA: Diagnosis not present

## 2019-07-06 DIAGNOSIS — S32049D Unspecified fracture of fourth lumbar vertebra, subsequent encounter for fracture with routine healing: Secondary | ICD-10-CM | POA: Diagnosis not present

## 2019-07-06 DIAGNOSIS — R262 Difficulty in walking, not elsewhere classified: Secondary | ICD-10-CM | POA: Diagnosis not present

## 2019-07-06 DIAGNOSIS — W19XXXD Unspecified fall, subsequent encounter: Secondary | ICD-10-CM | POA: Diagnosis not present

## 2019-07-06 DIAGNOSIS — Z8673 Personal history of transient ischemic attack (TIA), and cerebral infarction without residual deficits: Secondary | ICD-10-CM | POA: Diagnosis not present

## 2019-07-06 DIAGNOSIS — S36892D Contusion of other intra-abdominal organs, subsequent encounter: Secondary | ICD-10-CM | POA: Diagnosis not present

## 2019-07-06 DIAGNOSIS — Z7982 Long term (current) use of aspirin: Secondary | ICD-10-CM | POA: Diagnosis not present

## 2019-07-06 DIAGNOSIS — Z79899 Other long term (current) drug therapy: Secondary | ICD-10-CM | POA: Diagnosis not present

## 2019-07-06 DIAGNOSIS — I10 Essential (primary) hypertension: Secondary | ICD-10-CM | POA: Diagnosis not present

## 2019-07-06 DIAGNOSIS — R2681 Unsteadiness on feet: Secondary | ICD-10-CM | POA: Diagnosis not present

## 2019-07-06 DIAGNOSIS — Z79891 Long term (current) use of opiate analgesic: Secondary | ICD-10-CM | POA: Diagnosis not present

## 2019-07-06 DIAGNOSIS — R531 Weakness: Secondary | ICD-10-CM | POA: Diagnosis not present

## 2019-07-06 DIAGNOSIS — I252 Old myocardial infarction: Secondary | ICD-10-CM | POA: Diagnosis not present

## 2019-07-06 DIAGNOSIS — M6281 Muscle weakness (generalized): Secondary | ICD-10-CM | POA: Diagnosis not present

## 2019-07-06 DIAGNOSIS — G3184 Mild cognitive impairment, so stated: Secondary | ICD-10-CM | POA: Diagnosis not present

## 2019-07-06 DIAGNOSIS — F419 Anxiety disorder, unspecified: Secondary | ICD-10-CM | POA: Diagnosis not present

## 2019-07-06 DIAGNOSIS — Z96652 Presence of left artificial knee joint: Secondary | ICD-10-CM | POA: Diagnosis not present

## 2019-07-06 DIAGNOSIS — Z87891 Personal history of nicotine dependence: Secondary | ICD-10-CM | POA: Diagnosis not present

## 2019-07-06 DIAGNOSIS — S32029D Unspecified fracture of second lumbar vertebra, subsequent encounter for fracture with routine healing: Secondary | ICD-10-CM | POA: Diagnosis not present

## 2019-07-06 DIAGNOSIS — G609 Hereditary and idiopathic neuropathy, unspecified: Secondary | ICD-10-CM | POA: Diagnosis not present

## 2019-07-06 DIAGNOSIS — G894 Chronic pain syndrome: Secondary | ICD-10-CM | POA: Diagnosis not present

## 2019-07-06 DIAGNOSIS — Z7902 Long term (current) use of antithrombotics/antiplatelets: Secondary | ICD-10-CM | POA: Diagnosis not present

## 2019-07-08 ENCOUNTER — Ambulatory Visit (INDEPENDENT_AMBULATORY_CARE_PROVIDER_SITE_OTHER): Payer: PPO | Admitting: *Deleted

## 2019-07-08 DIAGNOSIS — I63411 Cerebral infarction due to embolism of right middle cerebral artery: Secondary | ICD-10-CM | POA: Diagnosis not present

## 2019-07-08 DIAGNOSIS — I472 Ventricular tachycardia, unspecified: Secondary | ICD-10-CM

## 2019-07-08 DIAGNOSIS — R109 Unspecified abdominal pain: Secondary | ICD-10-CM | POA: Diagnosis not present

## 2019-07-08 DIAGNOSIS — N2889 Other specified disorders of kidney and ureter: Secondary | ICD-10-CM | POA: Diagnosis not present

## 2019-07-09 LAB — CUP PACEART REMOTE DEVICE CHECK
Date Time Interrogation Session: 20201023180335
Implantable Pulse Generator Implant Date: 20190813

## 2019-07-11 DIAGNOSIS — S36892A Contusion of other intra-abdominal organs, initial encounter: Secondary | ICD-10-CM | POA: Diagnosis not present

## 2019-07-15 NOTE — Progress Notes (Signed)
Carelink Summary Report / Loop Recorder 

## 2019-07-27 DIAGNOSIS — N281 Cyst of kidney, acquired: Secondary | ICD-10-CM | POA: Diagnosis not present

## 2019-07-27 DIAGNOSIS — D49511 Neoplasm of unspecified behavior of right kidney: Secondary | ICD-10-CM | POA: Diagnosis not present

## 2019-08-09 DIAGNOSIS — Z79891 Long term (current) use of opiate analgesic: Secondary | ICD-10-CM | POA: Diagnosis not present

## 2019-08-09 DIAGNOSIS — D49511 Neoplasm of unspecified behavior of right kidney: Secondary | ICD-10-CM | POA: Diagnosis not present

## 2019-08-09 DIAGNOSIS — G603 Idiopathic progressive neuropathy: Secondary | ICD-10-CM | POA: Diagnosis not present

## 2019-08-09 DIAGNOSIS — G894 Chronic pain syndrome: Secondary | ICD-10-CM | POA: Diagnosis not present

## 2019-08-09 DIAGNOSIS — M47816 Spondylosis without myelopathy or radiculopathy, lumbar region: Secondary | ICD-10-CM | POA: Diagnosis not present

## 2019-08-10 ENCOUNTER — Ambulatory Visit (INDEPENDENT_AMBULATORY_CARE_PROVIDER_SITE_OTHER): Payer: PPO | Admitting: *Deleted

## 2019-08-10 DIAGNOSIS — I63411 Cerebral infarction due to embolism of right middle cerebral artery: Secondary | ICD-10-CM | POA: Diagnosis not present

## 2019-08-11 DIAGNOSIS — S36892A Contusion of other intra-abdominal organs, initial encounter: Secondary | ICD-10-CM | POA: Diagnosis not present

## 2019-08-11 LAB — CUP PACEART REMOTE DEVICE CHECK
Date Time Interrogation Session: 20201125130024
Implantable Pulse Generator Implant Date: 20190813

## 2019-08-15 ENCOUNTER — Telehealth: Payer: Self-pay | Admitting: *Deleted

## 2019-08-15 NOTE — Telephone Encounter (Signed)
Reviewed AF episode and tachy episode from 06/28/19 with Dr. Lovena Le. Pt was admitted that evening at [facility] due to a fall, ultimately diagnosed with L1-4 transverse process fractures and retroperitoneal hematoma. Plavix held during admission, but restarted at discharge. Per Dr. Lovena Le, plan to have pt f/u in AF Clinic to discuss [facility].  Spoke with patient. Advised of findings and plan to discuss [place]. He is agreeable to f/u in AF Clinic. Aware a scheduler will contact him with an appointment. No further questions at this time.

## 2019-08-15 NOTE — Telephone Encounter (Signed)
-----   Message from Evans Lance, MD sent at 08/12/2019  7:05 PM EST ----- Remote device check reviewed. Histograms appropriate. Leads and battery stable for patient. Follow up as outlined above. No recommended changes. Lets discuss his management. GT

## 2019-08-16 NOTE — Telephone Encounter (Signed)
Spoke with patient and his wife, they are aware of appt 08/24/2019 with Adline Peals, PA.

## 2019-08-17 ENCOUNTER — Other Ambulatory Visit: Payer: Self-pay | Admitting: Urology

## 2019-08-24 ENCOUNTER — Ambulatory Visit (HOSPITAL_COMMUNITY): Payer: PPO | Admitting: Physician Assistant

## 2019-09-01 ENCOUNTER — Ambulatory Visit (HOSPITAL_COMMUNITY)
Admission: RE | Admit: 2019-09-01 | Discharge: 2019-09-01 | Disposition: A | Discharge status: Home / Self Care | Payer: PPO | Source: Ambulatory Visit | Attending: Physician Assistant | Admitting: Physician Assistant

## 2019-09-01 ENCOUNTER — Encounter (HOSPITAL_COMMUNITY): Payer: Self-pay | Admitting: Physician Assistant

## 2019-09-01 ENCOUNTER — Other Ambulatory Visit: Payer: Self-pay

## 2019-09-01 VITALS — BP 136/84 | HR 92 | Ht 69.0 in | Wt 177.8 lb

## 2019-09-01 DIAGNOSIS — C649 Malignant neoplasm of unspecified kidney, except renal pelvis: Secondary | ICD-10-CM | POA: Insufficient documentation

## 2019-09-01 DIAGNOSIS — F419 Anxiety disorder, unspecified: Secondary | ICD-10-CM | POA: Insufficient documentation

## 2019-09-01 DIAGNOSIS — G8929 Other chronic pain: Secondary | ICD-10-CM | POA: Diagnosis not present

## 2019-09-01 DIAGNOSIS — I2510 Atherosclerotic heart disease of native coronary artery without angina pectoris: Secondary | ICD-10-CM | POA: Diagnosis not present

## 2019-09-01 DIAGNOSIS — I480 Paroxysmal atrial fibrillation: Secondary | ICD-10-CM

## 2019-09-01 DIAGNOSIS — F17290 Nicotine dependence, other tobacco product, uncomplicated: Secondary | ICD-10-CM | POA: Insufficient documentation

## 2019-09-01 DIAGNOSIS — Z79899 Other long term (current) drug therapy: Secondary | ICD-10-CM | POA: Insufficient documentation

## 2019-09-01 DIAGNOSIS — Z7982 Long term (current) use of aspirin: Secondary | ICD-10-CM | POA: Diagnosis not present

## 2019-09-01 DIAGNOSIS — Z8673 Personal history of transient ischemic attack (TIA), and cerebral infarction without residual deficits: Secondary | ICD-10-CM | POA: Diagnosis not present

## 2019-09-01 DIAGNOSIS — D6869 Other thrombophilia: Secondary | ICD-10-CM

## 2019-09-01 DIAGNOSIS — M549 Dorsalgia, unspecified: Secondary | ICD-10-CM | POA: Diagnosis not present

## 2019-09-01 DIAGNOSIS — I10 Essential (primary) hypertension: Secondary | ICD-10-CM | POA: Insufficient documentation

## 2019-09-01 DIAGNOSIS — I252 Old myocardial infarction: Secondary | ICD-10-CM | POA: Insufficient documentation

## 2019-09-01 DIAGNOSIS — Z7902 Long term (current) use of antithrombotics/antiplatelets: Secondary | ICD-10-CM | POA: Insufficient documentation

## 2019-09-01 NOTE — Progress Notes (Signed)
Primary Care Physician: Townsend Roger, MD Primary Cardiologist: Dr Sallyanne Kuster Primary Electrophysiologist: Dr Lovena Le Referring Physician: Dr Robley Fries is a 73 y.o. male with a history of HTN, nonobstructive CAD, CVA, renal cancer, and paroxysmal atrial fibrillation who presents for follow up in the [facility]. The patient was admitted on 04/25/2018 with left arm and left leg weakness.  Imaging demonstrated right frontal MCA and occipital infarcts felt to be embolic 2/2 unknown source, possibly due to ulcerated carotid plaque. Patient also ruled in for NSTEMI and underwent LHC which showed nonobstructive CAD. Patient had ILR placed by Dr Lovena Le which did show one brief episode of rapid afib in the setting of a fall with broken vertebrae.  Patient was unaware of his arrhythmia. He has a CHADS2VASC score of 5. Of note, he is scheduled for a laparoscopic nephrectomy on 09/27/19.   Today, he denies symptoms of palpitations, chest pain, shortness of breath, orthopnea, PND, lower extremity edema, dizziness, presyncope, syncope, snoring, daytime somnolence, bleeding, or neurologic sequela. The patient is tolerating medications without difficulties and is otherwise without complaint today.    Atrial Fibrillation Risk Factors:  he does not have symptoms or diagnosis of sleep apnea. he does not have a history of rheumatic fever. he does have a history of alcohol use.   he has a BMI of Body mass index is 26.26 kg/m.Marland Kitchen Filed Weights   09/01/19 0847  Weight: 80.6 kg    Family History  Problem Relation Age of Onset  . Heart disease Father      Atrial Fibrillation Management history:  Previous antiarrhythmic drugs: none Previous cardioversions: none Previous ablations: none CHADS2VASC score: 5 Anticoagulation history: none (ASA, Plavix)   Past Medical History:  Diagnosis Date  . Angioedema    from Benazepril reaction  . Anxiety    takes Celexa  .  Arthritis   . Chronic back pain   . CVA (cerebral vascular accident) ([place])   . Enlarged prostate   . History of blood transfusion   . Hypertension   . MI (myocardial infarction) ([place])   . Neuropathy    Past Surgical History:  Procedure Laterality Date  . CARDIAC CATHETERIZATION  05/03/2018  . COLONOSCOPY     one polyp removed - benign  . ELBOW SURGERY Right   . HERNIA REPAIR     umbilical  . JOINT REPLACEMENT Left    knee, x 2  . JOINT REPLACEMENT Right    hip  . JOINT REPLACEMENT Left    partial shoulder  . LEFT HEART CATH AND CORONARY ANGIOGRAPHY N/A 05/03/2018   Procedure: LEFT HEART CATH AND CORONARY ANGIOGRAPHY;  Surgeon: Martinique, Peter M, MD;  Location: [place] [facility];  Service: Cardiovascular;  Laterality: N/A;  . LOOP RECORDER INSERTION N/A 04/27/2018   Procedure: LOOP RECORDER INSERTION;  Surgeon: Evans Lance, MD;  Location: [place] CV [facility];  Service: Cardiovascular;  Laterality: N/A;  . SPINAL CORD STIMULATOR INSERTION N/A 09/28/2014   Procedure: LUMBAR SPINAL CORD STIMULATOR INSERTION;  Surgeon: Melina Schools, MD;  Location: [facility];  Service: Orthopedics;  Laterality: N/A;  . TEE WITHOUT CARDIOVERSION N/A 04/27/2018   Procedure: TRANSESOPHAGEAL ECHOCARDIOGRAM (TEE);  Surgeon: Larey Dresser, MD;  Location: [facility] ENDOSCOPY;  Service: Cardiovascular;  Laterality: N/A;  . TONSILLECTOMY    . VASECTOMY      Current Outpatient Medications  Medication Sig Dispense Refill  . acetaminophen (TYLENOL) 325 MG tablet Take 2 tablets (  650 mg total) by mouth every 4 (four) hours as needed for headache or mild pain.    Marland Kitchen amLODipine (NORVASC) 10 MG tablet Take 10 mg by mouth daily.    Marland Kitchen aspirin 325 MG tablet Take 1 tablet (325 mg total) by mouth daily. 30 tablet 3  . atorvastatin (LIPITOR) 40 MG tablet Take 1 tablet (40 mg total) by mouth daily at 6 PM. 30 tablet 3  . b complex vitamins tablet Take 1 tablet by mouth daily.    . cetirizine (ZYRTEC) 10 MG tablet Take 1 tablet (10  mg total) by mouth daily.    . cholecalciferol (VITAMIN D) 1000 units tablet Take 2,000 Units by mouth daily.    . clopidogrel (PLAVIX) 75 MG tablet Take 1 tablet (75 mg total) by mouth daily. 30 tablet 3  . colchicine 0.6 MG tablet Take 0.6 mg by mouth 2 (two) times daily.     . divalproex (DEPAKOTE) 500 MG DR tablet Take 1 tablet (500 mg total) by mouth every 12 (twelve) hours. 30 tablet 5  . docusate sodium (COLACE) 100 MG capsule Take 1 capsule (100 mg total) by mouth 2 (two) times daily. 10 capsule 0  . magnesium gluconate (MAGONATE) 500 MG tablet Take 500 mg by mouth 2 (two) times daily.    Marland Kitchen omeprazole (PRILOSEC) 20 MG capsule Take 20 mg by mouth daily.    . Oxycodone HCl 20 MG TABS Take 1 tablet (20 mg total) by mouth every 8 (eight) hours as needed. (Patient taking differently: Take 20 mg by mouth 2 (two) times daily. ) 1 tablet 0  . polyethylene glycol (MIRALAX / GLYCOLAX) 17 g packet Take 17 g by mouth daily. 14 each 0  . traZODone (DESYREL) 50 MG tablet Take 50 mg by mouth at bedtime.     No current facility-administered medications for this encounter.   Facility-Administered Medications Ordered in Other Encounters  Medication Dose Route Frequency Provider Last Rate Last Admin  . fentaNYL (SUBLIMAZE) injection    PRN Martinique, Peter M, MD   25 mcg at 05/03/18 1336  . Heparin (Porcine) in NaCl 1000-0.9 UT/500ML-% SOLN    PRN Martinique, Peter M, MD   500 mL at 05/03/18 1331  . Heparin (Porcine) in NaCl 1000-0.9 UT/500ML-% SOLN    PRN Martinique, Peter M, MD   500 mL at 05/03/18 1332  . heparin injection    PRN Martinique, Peter M, MD   4,000 Units at 05/03/18 1345  . iopamidol (ISOVUE-370) 76 % injection    PRN Martinique, Peter M, MD   85 mL at 05/03/18 1357  . lidocaine (PF) (XYLOCAINE) 1 % injection    PRN Martinique, Peter M, MD   2 mL at 05/03/18 1342  . midazolam (VERSED) injection    PRN Martinique, Peter M, MD   1 mg at 05/03/18 1336  . Radial Cocktail/Verapamil only    PRN Martinique, Peter M, MD    Given at 05/03/18 1344    Allergies  Allergen Reactions  . Ace Inhibitors Shortness Of Breath, Swelling and Other (See Comments)    Angioedema (throat and tongue became swollen)  . Morphine Other (See Comments)    Causes confusion and "makes me crazy," per the patient  . Sulfa Antibiotics Other (See Comments)    Unknown reaction, from childhood    Social History   Socioeconomic History  . Marital status: Married    Spouse name: Not on file  . Number of children: Not on file  .  Years of education: Not on file  . Highest education level: Not on file  Occupational History  . Not on file  Tobacco Use  . Smoking status: Current Every Day Smoker    Types: E-cigarettes, Cigars  . Smokeless tobacco: Former Systems developer    Types: Snuff  . Tobacco comment: does not use any tobacco products any more  Substance and Sexual Activity  . Alcohol use: Yes    Alcohol/week: 3.0 standard drinks    Types: 3 Shots of liquor per week    Comment: weekly  . Drug use: No  . Sexual activity: Never  Other Topics Concern  . Not on file  Social History Narrative  . Not on file   Social Determinants of Health   Financial Resource Strain:   . Difficulty of Paying Living Expenses: Not on file  Food Insecurity:   . Worried About Charity fundraiser in the Last Year: Not on file  . Ran Out of Food in the Last Year: Not on file  Transportation Needs:   . Lack of Transportation (Medical): Not on file  . Lack of Transportation (Non-Medical): Not on file  Physical Activity:   . Days of Exercise per Week: Not on file  . Minutes of Exercise per Session: Not on file  Stress:   . Feeling of Stress : Not on file  Social Connections:   . Frequency of Communication with Friends and Family: Not on file  . Frequency of Social Gatherings with Friends and Family: Not on file  . Attends Religious Services: Not on file  . Active Member of Clubs or Organizations: Not on file  . Attends Archivist Meetings:  Not on file  . Marital Status: Not on file  Intimate Partner Violence:   . Fear of Current or Ex-Partner: Not on file  . Emotionally Abused: Not on file  . Physically Abused: Not on file  . Sexually Abused: Not on file     ROS- All systems are reviewed and negative except as per the HPI above.  Physical Exam: Vitals:   09/01/19 0847  BP: 136/84  Pulse: 92  Weight: 80.6 kg  Height: 5\' 9"  (1.753 m)    GEN- The patient is well appearing, alert and oriented x 3 today.   Head- normocephalic, atraumatic Eyes-  Sclera clear, conjunctiva pink Ears- hearing intact Oropharynx- clear Neck- supple  Lungs- Clear to ausculation bilaterally, normal work of breathing Heart- Regular rate and rhythm, no murmurs, rubs or gallops  GI- soft, NT, ND, + BS Extremities- no clubbing, cyanosis, or edema MS- no significant deformity or atrophy Skin- no rash or lesion Psych- euthymic mood, full affect Neuro- strength and sensation are intact  Wt Readings from Last 3 Encounters:  09/01/19 80.6 kg  06/29/19 83.3 kg  03/15/19 80.3 kg    EKG today demonstrates SR HR 92, RAD, PR 170, QRS 78, QTc 427  Echo 04/26/18 demonstrated  Left ventricle: The cavity size was normal. Wall thickness was   increased in a pattern of mild LVH. Systolic function was normal.   The estimated ejection fraction was in the range of 60% to 65%.  Epic records are reviewed at length today  Assessment and Plan:  1. Paroxysmal atrial fibrillation Patient noted to have a brief episode of afib in the setting of other medical stressors. General education about afib provided and questions answered. We also discussed his stroke risk and the risks and benefits of anticoagulation. After d/w Dr Sallyanne Kuster,  would recommend patient stop ASA and continue Plavix with the addition of Eliquis.  Patient hesitant about changing medications at this time given his upcoming kidney surgery. He would like to discuss with his PCP at his follow  up visit next week. I did discuss with the patient that ASA and Plavix would not protect him from a stroke caused by afib. Patient voices understanding.  This patients CHA2DS2-VASc Score and unadjusted Ischemic Stroke Rate (% per year) is equal to 7.2 % stroke rate/year from a score of 5  Above score calculated as 1 point each if present [CHF, HTN, DM, Vascular=MI/PAD/Aortic Plaque, Age if 65-74, or Male] Above score calculated as 2 points each if present [Age > 75, or Stroke/TIA/TE]   2. CAD H/o NSTEMI, nonobstructive on LHC. No anginal symptoms.   3. HTN Stable, no changes today.  4. Renal Cancer Scheduled for nephrectomy 09/27/19.    Will request follow up with Dr Sallyanne Kuster. Follow up in the AF clinic in 3 months for now. Patient will call clinic next week with decision about anticoagulation.    [facility] [address] [place], [place] [address] [phone] 09/01/2019 9:28 AM

## 2019-09-01 NOTE — Progress Notes (Signed)
Thanks, William Jordan. I understand where he is coming from. Will try to get him in right after his surgery. [name] - end of January, early February, please?

## 2019-09-05 ENCOUNTER — Telehealth (HOSPITAL_COMMUNITY): Payer: Self-pay | Admitting: *Deleted

## 2019-09-05 ENCOUNTER — Encounter: Payer: Self-pay | Admitting: Cardiovascular Disease

## 2019-09-05 DIAGNOSIS — I2510 Atherosclerotic heart disease of native coronary artery without angina pectoris: Secondary | ICD-10-CM | POA: Diagnosis not present

## 2019-09-05 DIAGNOSIS — I4891 Unspecified atrial fibrillation: Secondary | ICD-10-CM | POA: Diagnosis not present

## 2019-09-05 NOTE — Telephone Encounter (Signed)
-----   Message from Rivka Barbara sent at 09/05/2019 11:51 AM EST ----- Regarding: RE: appt w dr c Good morning,  I called this patient to schedule an appt patient declined the appointment. He stated that he does not need anything at this time because he has surgery coming up and he is dealing with a cancer diagnosis. I just wanted to let you know. Have a great day and Happy Holidays! ----- Message ----- From: Juluis Mire, RN Sent: 09/01/2019   9:41 AM EST To: Cv Div Nl Scheduling Subject: appt w dr c                                    Per Malka So PA pt needs to see Dr. Loletha Grayer in the next few weeks for follow up (pt was consult with dr c in hospital last year but never followed up) please contact pt to schedule. Thanks Nurse, adult [facility]

## 2019-09-06 ENCOUNTER — Telehealth: Payer: Self-pay | Admitting: Cardiology

## 2019-09-06 NOTE — Telephone Encounter (Signed)
   [facility] Pre-operative Risk Assessment    Request for surgical clearance:  1. What type of surgery is being performed? R Nephrectomy (d/t renal mass)   2. When is this surgery scheduled? 09-27-19  3. What type of clearance is required (medical clearance vs. Pharmacy clearance to hold med vs. Both)? both  4. Are there any medications that need to be held prior to surgery and how long? Plavix 1 week, aspirin 5 days  5. Practice name and name of physician performing surgery? Arnette Schaumann, [facility]  6. What is your office phone number: [phone]   7.   What is your office fax number: [phone]  8.   Anesthesia type (None, local, MAC, general) ? General  Procedure will last ~ 3 hours   Johnna Acosta 09/06/2019, 4:13 PM  _________________________________________________________________   (provider comments below)

## 2019-09-06 NOTE — Telephone Encounter (Signed)
The patient is on aspirin and plavix per neurology for stroke. He was seen in the afib clinic on 09/01/19 and recommended to continue Plavix and stop aspirin with addition of Eliquis in the setting of prior stroke. Pt did not want to make the switch. It was advised for him to have a follow up with Dr. Sallyanne Kuster to further discuss.   It looks like he has an appt with Kerin Ransom on 12/31 at 2:30. (there is an x by the appt) I called the patient to conform and he insists that he has an appt with Dr. Bettina Gavia on that day. He refused to talk to me any further and hung up.   I will send to the preop call back to confirm that pt has appt with Dr. Bettina Gavia and add preop clearance to the appointment notes.

## 2019-09-07 NOTE — Telephone Encounter (Signed)
Luke, I am not sure why the pt is back on your schedule. See previous notes from earlier in regards to appts.

## 2019-09-07 NOTE — Telephone Encounter (Signed)
I s/w pt today to confirm his appt with Kerin Ransom, [place] on 09/15/19. Pt insist that he has an appt with Dr. Loney Hering on 09/15/19. I informed the pt that I just s/w the [place] office and they do not have any appt schedule for him with Dr. Bettina Gavia. I informed the pt that he may want to call their office in regards to appt. Pt states he will call their office now. Pt said he does not need our office appt set with Kerin Ransom, [place] 09/15/19 that he is going to see Dr. Bettina Gavia, which pt states this will be the first time he will be seeing Dr. Bettina Gavia. I will cancel the appt with Kerin Ransom, PAC.

## 2019-09-07 NOTE — Telephone Encounter (Signed)
Looks like pt has been set back up with Kerin Ransom, [place] for 09/15/19. I will route notes to PA for upcoming appt. I will send notes to the surgeon as FYI about appt. I will remove from the pre op call back pool.

## 2019-09-07 NOTE — Telephone Encounter (Signed)
I have never seen this patient.  He was just seen in the AF clinic 09/01/2019.  Can someone explain why he is being forced to see me 12/31/ if he doesn't want toKerin Ransom PA-C 09/07/2019 4:23 PM

## 2019-09-08 ENCOUNTER — Other Ambulatory Visit: Payer: Self-pay | Admitting: *Deleted

## 2019-09-08 ENCOUNTER — Encounter: Payer: Self-pay | Admitting: *Deleted

## 2019-09-10 DIAGNOSIS — S36892A Contusion of other intra-abdominal organs, initial encounter: Secondary | ICD-10-CM | POA: Diagnosis not present

## 2019-09-12 NOTE — Telephone Encounter (Signed)
I spoke with Daune Perch- NP.  The patient needs pre op clearance, that's the reason for his appointment 12/31.  Kerin Ransom PA-C 09/12/2019 9:28 AM

## 2019-09-13 DIAGNOSIS — D49511 Neoplasm of unspecified behavior of right kidney: Secondary | ICD-10-CM | POA: Diagnosis not present

## 2019-09-15 ENCOUNTER — Encounter: Payer: Self-pay | Admitting: Cardiology

## 2019-09-15 ENCOUNTER — Ambulatory Visit (INDEPENDENT_AMBULATORY_CARE_PROVIDER_SITE_OTHER): Payer: PPO | Admitting: Cardiology

## 2019-09-15 ENCOUNTER — Ambulatory Visit: Payer: PPO | Admitting: Cardiology

## 2019-09-15 ENCOUNTER — Other Ambulatory Visit: Payer: Self-pay

## 2019-09-15 VITALS — BP 136/87 | HR 104 | Temp 99.9°F | Ht 69.0 in | Wt 171.0 lb

## 2019-09-15 DIAGNOSIS — Z01818 Encounter for other preprocedural examination: Secondary | ICD-10-CM | POA: Insufficient documentation

## 2019-09-15 DIAGNOSIS — I252 Old myocardial infarction: Secondary | ICD-10-CM

## 2019-09-15 DIAGNOSIS — I480 Paroxysmal atrial fibrillation: Secondary | ICD-10-CM | POA: Diagnosis not present

## 2019-09-15 DIAGNOSIS — Z8673 Personal history of transient ischemic attack (TIA), and cerebral infarction without residual deficits: Secondary | ICD-10-CM

## 2019-09-15 NOTE — Patient Instructions (Signed)
Medication Instructions: No changes *If you need a refill on your cardiac medications before your next appointment, please call your pharmacy*  Lab Work: None If you have labs (blood work) drawn today and your tests are completely normal, you will receive your results only by: Marland Kitchen MyChart Message (if you have MyChart) OR . A paper copy in the mail If you have any lab test that is abnormal or we need to change your treatment, we will call you to review the results.  Testing/Procedures: none  Follow-Up: At [facility], you and your health needs are our priority.  As part of our continuing mission to provide you with exceptional heart care, we have created designated Provider Care Teams.  These Care Teams include your primary Cardiologist (physician) and Advanced Practice Providers (APPs -  Physician Assistants and Nurse Practitioners) who all work together to provide you with the care you need, when you need it.  Your next appointment:   2 month(s)  The format for your next appointment:   In Person  Provider:   Sanda Klein, MD  Other Instructions

## 2019-09-15 NOTE — Assessment & Plan Note (Signed)
Documented on LINQ- CHADS VASC=5, pt declined anticoagulation

## 2019-09-15 NOTE — Assessment & Plan Note (Signed)
Aug 2019- moderate non obstructive CAD at cath, normal LVF

## 2019-09-15 NOTE — Progress Notes (Signed)
Cardiology Office Note:    Date:  09/15/2019   ID:  RIGLEY FALGOUT, DOB Feb 24, 1946, MRN [id number]  PCP:  Townsend Roger, MD  Cardiologist:  Sanda Klein, MD  Electrophysiologist:  None   Referring MD: Townsend Roger, MD   No chief complaint on file. Pre op clearance  History of Present Illness:    William Jordan is a 73 y.o. male who presented 05/11/2018 one week after presentation with right MCA embolic stroke, likely related to large ulcerated soft plaque in the right common carotid artery with simultaneous complaints of chest pain and elevated troponin consistent with non-STEMI (peak 3.09), transient episodes of nonsustained VT.He was having postinfarction angina. Cath revealed non obstructive CAD, echo showed an EF of 50-55%. The patient did have syncope and a LINQ showed PAF.  He was seen in the AF clinic 09/01/2019.  CHADS VASC =5 and Eliquis recommended but the patient has renal cell cancer and needs nephrectomy.  He did not want to start it at that time.  He was contacted recenty for cardiac clearance and told he needed to be seen for this and was added to my schedule today.   He denies any chest pain.  He is anxious to proceed with surgery. He is currently on full dose ASA and Plavix.   Past Medical History:  Diagnosis Date  . Angioedema    from Benazepril reaction  . Anxiety    takes Celexa  . Arthritis   . Chronic back pain   . CVA (cerebral vascular accident) ([place])   . Enlarged prostate   . History of blood transfusion   . Hypertension   . MI (myocardial infarction) ([place])   . Neuropathy     Past Surgical History:  Procedure Laterality Date  . CARDIAC CATHETERIZATION  05/03/2018  . COLONOSCOPY     one polyp removed - benign  . ELBOW SURGERY Right   . HERNIA REPAIR     umbilical  . JOINT REPLACEMENT Left    knee, x 2  . JOINT REPLACEMENT Right    hip  . JOINT REPLACEMENT Left    partial shoulder  . LEFT HEART CATH AND CORONARY ANGIOGRAPHY N/A 05/03/2018    Procedure: LEFT HEART CATH AND CORONARY ANGIOGRAPHY;  Surgeon: Martinique, Peter M, MD;  Location: [place] [facility];  Service: Cardiovascular;  Laterality: N/A;  . LOOP RECORDER INSERTION N/A 04/27/2018   Procedure: LOOP RECORDER INSERTION;  Surgeon: Evans Lance, MD;  Location: [place] [facility];  Service: Cardiovascular;  Laterality: N/A;  . SPINAL CORD STIMULATOR INSERTION N/A 09/28/2014   Procedure: LUMBAR SPINAL CORD STIMULATOR INSERTION;  Surgeon: Melina Schools, MD;  Location: [place];  Service: Orthopedics;  Laterality: N/A;  . TEE WITHOUT CARDIOVERSION N/A 04/27/2018   Procedure: TRANSESOPHAGEAL ECHOCARDIOGRAM (TEE);  Surgeon: Larey Dresser, MD;  Location: [facility];  Service: Cardiovascular;  Laterality: N/A;  . TONSILLECTOMY    . VASECTOMY      Current Medications: Current Meds  Medication Sig  . acetaminophen (TYLENOL) 325 MG tablet Take 2 tablets (650 mg total) by mouth every 4 (four) hours as needed for headache or mild pain.  Marland Kitchen amLODipine (NORVASC) 10 MG tablet Take 10 mg by mouth daily.  Marland Kitchen aspirin 325 MG tablet Take 1 tablet (325 mg total) by mouth daily.  Marland Kitchen atorvastatin (LIPITOR) 40 MG tablet Take 1 tablet (40 mg total) by mouth daily at 6 PM.  . b complex vitamins tablet Take 1 tablet by  mouth daily.  . cetirizine (ZYRTEC) 10 MG tablet Take 1 tablet (10 mg total) by mouth daily.  . cholecalciferol (VITAMIN D) 1000 units tablet Take 2,000 Units by mouth daily.  . clopidogrel (PLAVIX) 75 MG tablet Take 1 tablet (75 mg total) by mouth daily.  . colchicine 0.6 MG tablet Take 0.6 mg by mouth 2 (two) times daily.   . divalproex (DEPAKOTE) 500 MG DR tablet Take 1 tablet (500 mg total) by mouth every 12 (twelve) hours.  . docusate sodium (COLACE) 100 MG capsule Take 1 capsule (100 mg total) by mouth 2 (two) times daily.  . magnesium gluconate (MAGONATE) 500 MG tablet Take 500 mg by mouth 2 (two) times daily.  Marland Kitchen omeprazole (PRILOSEC) 20 MG capsule Take 20 mg by mouth daily.  .  Oxycodone HCl 20 MG TABS Take 1 tablet (20 mg total) by mouth every 8 (eight) hours as needed. (Patient taking differently: Take 20 mg by mouth 2 (two) times daily. )  . polyethylene glycol (MIRALAX / GLYCOLAX) 17 g packet Take 17 g by mouth daily.  . traZODone (DESYREL) 50 MG tablet Take 50 mg by mouth at bedtime.     Allergies:   Ace inhibitors, Morphine, and Sulfa antibiotics   Social History   Socioeconomic History  . Marital status: Married    Spouse name: Not on file  . Number of children: Not on file  . Years of education: Not on file  . Highest education level: Not on file  Occupational History  . Not on file  Tobacco Use  . Smoking status: Current Every Day Smoker    Types: E-cigarettes, Cigars  . Smokeless tobacco: Former Systems developer    Types: Snuff  . Tobacco comment: does not use any tobacco products any more  Substance and Sexual Activity  . Alcohol use: Yes    Alcohol/week: 3.0 standard drinks    Types: 3 Shots of liquor per week    Comment: weekly  . Drug use: No  . Sexual activity: Never  Other Topics Concern  . Not on file  Social History Narrative  . Not on file   Social Determinants of Health   Financial Resource Strain:   . Difficulty of Paying Living Expenses: Not on file  Food Insecurity:   . Worried About Charity fundraiser in the Last Year: Not on file  . Ran Out of Food in the Last Year: Not on file  Transportation Needs:   . Lack of Transportation (Medical): Not on file  . Lack of Transportation (Non-Medical): Not on file  Physical Activity:   . Days of Exercise per Week: Not on file  . Minutes of Exercise per Session: Not on file  Stress:   . Feeling of Stress : Not on file  Social Connections:   . Frequency of Communication with Friends and Family: Not on file  . Frequency of Social Gatherings with Friends and Family: Not on file  . Attends Religious Services: Not on file  . Active Member of Clubs or Organizations: Not on file  . Attends  Archivist Meetings: Not on file  . Marital Status: Not on file     Family History: The patient's family history includes Heart disease in his father.  ROS:   Please see the history of present illness.     All other systems reviewed and are negative.  EKGs/Labs/Other Studies Reviewed:    The following studies were reviewed today: Cath 05/03/2018 Echo 04/27/2018  EKG:  EKG is ordered today.  The ekg ordered today demonstrates NSR, ST, 100, LAE  Recent Labs: 11/06/2018: TSH 1.805 06/28/2019: ALT 18 06/30/2019: BUN 12; Creatinine, Ser 0.78; Potassium 3.5; Sodium 133 07/01/2019: Hemoglobin 11.1; Platelets 304  Recent Lipid Panel    Component Value Date/Time   CHOL 157 04/26/2018 0355   TRIG 123 04/26/2018 0355   HDL 60 04/26/2018 0355   CHOLHDL 2.6 04/26/2018 0355   VLDL 25 04/26/2018 0355   LDLCALC 72 04/26/2018 0355    Physical Exam:    VS:  BP 136/87   Pulse (!) 104   Temp 99.9 F (37.7 C)   Ht 5\' 9"  (1.753 m)   Wt 171 lb (77.6 kg)   SpO2 92%   BMI 25.25 kg/m     Wt Readings from Last 3 Encounters:  09/15/19 171 lb (77.6 kg)  09/01/19 177 lb 12.8 oz (80.6 kg)  06/29/19 183 lb 10.3 oz (83.3 kg)     GEN:  Well nourished, well developed in no acute distress HEENT: Normal NECK: No JVD; No carotid bruits CARDIAC: RRR, no murmurs, rubs, gallops RESPIRATORY:  Clear to auscultation without rales, wheezing or rhonchi  ABDOMEN: Soft, non-tender, non-distended MUSCULOSKELETAL:  No edema; No deformity  SKIN: Warm and dry NEUROLOGIC:  Alert and oriented x 3 PSYCHIATRIC:  Normal affect   ASSESSMENT:    Pre-operative clearance Pt seen today for pre op clearance prior to nephrectomy  History of non-ST elevation myocardial infarction (NSTEMI) Aug 2019- moderate non obstructive CAD at cath, normal LVF  Paroxysmal atrial fibrillation ([place]) Documented on LINQ- CHADS VASC=5, pt declined anticoagulation  History of CVA (cerebrovascular accident) Aug  2019  PLAN:    Pt is an acceptable risk from a cardiac standpoint for proposed surgery without further cardiac work up.  He knows there is some risk being off Plavix and ASA but this appears to be unavoidable.  He would like to see Dr Sallyanne Kuster again post op and discuss anticoagulation again. I will fax official clearance to Dr Claudia Desanctis.    Medication Adjustments/Labs and Tests Ordered: Current medicines are reviewed at length with the patient today.  Concerns regarding medicines are outlined above.  Orders Placed This Encounter  Procedures  . EKG 12-Lead   No orders of the defined types were placed in this encounter.   Patient Instructions  Medication Instructions: No changes *If you need a refill on your cardiac medications before your next appointment, please call your pharmacy*  Lab Work: None If you have labs (blood work) drawn today and your tests are completely normal, you will receive your results only by: Marland Kitchen MyChart Message (if you have MyChart) OR . A paper copy in the mail If you have any lab test that is abnormal or we need to change your treatment, we will call you to review the results.  Testing/Procedures: none  Follow-Up: At [facility], you and your health needs are our priority.  As part of our continuing mission to provide you with exceptional heart care, we have created designated Provider Care Teams.  These Care Teams include your primary Cardiologist (physician) and Advanced Practice Providers (APPs -  Physician Assistants and Nurse Practitioners) who all work together to provide you with the care you need, when you need it.  Your next appointment:   2 month(s)  The format for your next appointment:   In Person  Provider:   Sanda Klein, MD  Other Instructions      Signed, Kerin Ransom, PA-C  09/15/2019 3:21 PM    [facility]

## 2019-09-15 NOTE — Assessment & Plan Note (Signed)
Aug 2019 

## 2019-09-15 NOTE — Assessment & Plan Note (Signed)
Pt seen today for pre op clearance prior to nephrectomy

## 2019-09-15 NOTE — Telephone Encounter (Signed)
   Primary Cardiologist: Sanda Klein, MD  Chart reviewed as part of pre-operative protocol coverage. Given past medical history and time since last visit, based on ACC/AHA guidelines, ANTONIE FAUSNAUGH would be at acceptable risk for the planned procedure without further cardiovascular testing.   OK to hold Plavix 7 days pre op and aspirin 3-5 days pre op if needed.  I will route this recommendation to the requesting party via Epic fax function and remove from pre-op pool.  Please call with questions.  Kerin Ransom, PA-C 09/15/2019, 3:28 PM

## 2019-09-20 NOTE — Patient Instructions (Addendum)
DUE TO COVID-19 ONLY ONE VISITOR IS ALLOWED TO COME WITH YOU AND STAY IN THE WAITING ROOM ONLY DURING PRE OP AND PROCEDURE. THE ONE VISITOR MAY VISIT WITH YOU IN YOUR PRIVATE ROOM DURING VISITING HOURS ONLY!!   COVID SWAB TESTING MUST BE COMPLETED ON:  Friday, Jan. 8, 2021 at 8:30 AM   [address] [place] [facility] enter pre surgical testing line (Must self quarantine after testing. Follow instructions on handout.)             Your procedure is scheduled on: Tuesday, Jan. 12, 2021   Report to [facility] Main  Entrance    Report to admitting at 8:00 AM   Call this number if you have problems the morning of surgery [phone]   Do not eat food or drink liquids :After Midnight.   Brush your teeth the morning of surgery.   Do NOT smoke after Midnight   Take these medicines the morning of surgery (IF YOU NORMALLY TAKE THIS IN THE MORNING) with A SIP OF WATER: Amlodipine, Cetirizine, Colchicine, Depakote, Colace, Omeprazole, Oxycodone                               You may not have any metal on your body including jewelry, and body piercings             Do not wear lotions, powders, perfumes/cologne, or deodorant                          Men may shave face and neck.   Do not bring valuables to the hospital. [place].   Contacts, dentures or bridgework may not be worn into surgery.   Bring small overnight bag day of surgery.    Special Instructions: Bring a copy of your healthcare power of attorney and living will documents         the day of surgery if you haven't scanned them in before.              Please read over the following fact sheets you were given:  [facility] - Preparing for Surgery Before surgery, you can play an important role.  Because skin is not sterile, your skin needs to be as free of germs as possible.  You can reduce the number of germs on your skin by washing with CHG  (chlorahexidine gluconate) soap before surgery.  CHG is an antiseptic cleaner which kills germs and bonds with the skin to continue killing germs even after washing. Please DO NOT use if you have an allergy to CHG or antibacterial soaps.  If your skin becomes reddened/irritated stop using the CHG and inform your nurse when you arrive at Short Stay. Do not shave (including legs and underarms) for at least 48 hours prior to the first CHG shower.  You may shave your face/neck.  Please follow these instructions carefully:  1.  Shower with CHG Soap the night before surgery and the  morning of surgery.  2.  If you choose to wash your hair, wash your hair first as usual with your normal  shampoo.  3.  After you shampoo, rinse your hair and body thoroughly to remove the shampoo.  4.  Use CHG as you would any other liquid soap.  You can apply chg directly to the skin and wash.  Gently with a scrungie or clean washcloth.  5.  Apply the CHG Soap to your body ONLY FROM THE NECK DOWN.   Do   not use on face/ open                           Wound or open sores. Avoid contact with eyes, ears mouth and   genitals (private parts).                       Wash face,  Genitals (private parts) with your normal soap.             6.  Wash thoroughly, paying special attention to the area where your    surgery  will be performed.  7.  Thoroughly rinse your body with warm water from the neck down.  8.  DO NOT shower/wash with your normal soap after using and rinsing off the CHG Soap.                9.  Pat yourself dry with a clean towel.            10.  Wear clean pajamas.            11.  Place clean sheets on your bed the night of your first shower and do not  sleep with pets. Day of Surgery : Do not apply any lotions/deodorants the morning of surgery.  Please wear clean clothes to the hospital/surgery center.  FAILURE TO FOLLOW THESE INSTRUCTIONS MAY RESULT IN THE CANCELLATION OF YOUR  SURGERY  PATIENT SIGNATURE_________________________________  NURSE SIGNATURE__________________________________  ________________________________________________________________________

## 2019-09-21 ENCOUNTER — Encounter (HOSPITAL_COMMUNITY)
Admission: RE | Admit: 2019-09-21 | Discharge: 2019-09-21 | Disposition: A | Discharge status: Home / Self Care | Payer: PPO | Source: Ambulatory Visit | Attending: Urology | Admitting: Urology

## 2019-09-21 ENCOUNTER — Other Ambulatory Visit: Payer: Self-pay

## 2019-09-21 DIAGNOSIS — N2889 Other specified disorders of kidney and ureter: Secondary | ICD-10-CM | POA: Insufficient documentation

## 2019-09-21 DIAGNOSIS — Z7902 Long term (current) use of antithrombotics/antiplatelets: Secondary | ICD-10-CM | POA: Insufficient documentation

## 2019-09-21 DIAGNOSIS — F17200 Nicotine dependence, unspecified, uncomplicated: Secondary | ICD-10-CM | POA: Insufficient documentation

## 2019-09-21 DIAGNOSIS — I480 Paroxysmal atrial fibrillation: Secondary | ICD-10-CM | POA: Insufficient documentation

## 2019-09-21 DIAGNOSIS — Z8673 Personal history of transient ischemic attack (TIA), and cerebral infarction without residual deficits: Secondary | ICD-10-CM | POA: Insufficient documentation

## 2019-09-21 DIAGNOSIS — G8929 Other chronic pain: Secondary | ICD-10-CM | POA: Insufficient documentation

## 2019-09-21 DIAGNOSIS — I10 Essential (primary) hypertension: Secondary | ICD-10-CM | POA: Diagnosis not present

## 2019-09-21 DIAGNOSIS — Z01812 Encounter for preprocedural laboratory examination: Secondary | ICD-10-CM | POA: Diagnosis not present

## 2019-09-21 DIAGNOSIS — Z79899 Other long term (current) drug therapy: Secondary | ICD-10-CM | POA: Insufficient documentation

## 2019-09-21 DIAGNOSIS — Z7982 Long term (current) use of aspirin: Secondary | ICD-10-CM | POA: Diagnosis not present

## 2019-09-21 DIAGNOSIS — F419 Anxiety disorder, unspecified: Secondary | ICD-10-CM | POA: Diagnosis not present

## 2019-09-21 DIAGNOSIS — G629 Polyneuropathy, unspecified: Secondary | ICD-10-CM | POA: Insufficient documentation

## 2019-09-21 DIAGNOSIS — Z96642 Presence of left artificial hip joint: Secondary | ICD-10-CM | POA: Diagnosis not present

## 2019-09-21 DIAGNOSIS — Z96612 Presence of left artificial shoulder joint: Secondary | ICD-10-CM | POA: Insufficient documentation

## 2019-09-21 DIAGNOSIS — Z96652 Presence of left artificial knee joint: Secondary | ICD-10-CM | POA: Insufficient documentation

## 2019-09-21 DIAGNOSIS — I252 Old myocardial infarction: Secondary | ICD-10-CM | POA: Insufficient documentation

## 2019-09-21 LAB — CBC
HCT: 41 % (ref 39.0–52.0)
Hemoglobin: 13.5 g/dL (ref 13.0–17.0)
MCH: 30.8 pg (ref 26.0–34.0)
MCHC: 32.9 g/dL (ref 30.0–36.0)
MCV: 93.4 fL (ref 80.0–100.0)
Platelets: 172 10*3/uL (ref 150–400)
RBC: 4.39 MIL/uL (ref 4.22–5.81)
RDW: 12.9 % (ref 11.5–15.5)
WBC: 4.9 10*3/uL (ref 4.0–10.5)
nRBC: 0 % (ref 0.0–0.2)

## 2019-09-21 LAB — BASIC METABOLIC PANEL
Anion gap: 11 (ref 5–15)
BUN: 15 mg/dL (ref 8–23)
CO2: 29 mmol/L (ref 22–32)
Calcium: 9.6 mg/dL (ref 8.9–10.3)
Chloride: 98 mmol/L (ref 98–111)
Creatinine, Ser: 1.09 mg/dL (ref 0.61–1.24)
GFR calc Af Amer: >60 mL/min (ref >60)
GFR calc non Af Amer: >60 mL/min (ref >60)
Glucose, Bld: 108 mg/dL — ABNORMAL HIGH (ref 70–99)
Potassium: 4.5 mmol/L (ref 3.5–5.1)
Sodium: 138 mmol/L (ref 135–145)

## 2019-09-21 NOTE — Patient Instructions (Signed)
DUE TO COVID-19 ONLY ONE VISITOR IS ALLOWED TO COME WITH YOU AND STAY IN THE WAITING ROOM ONLY DURING PRE OP AND PROCEDURE. THE ONE VISITOR MAY VISIT WITH YOU IN YOUR PRIVATE ROOM DURING VISITING HOURS ONLY!!   COVID SWAB TESTING MUST BE COMPLETED ON:      [address], [place] [facility] enter pre surgical testing line (Must self quarantine after testing. Follow instructions on handout.)        [address], [place], [place] - the short stay covered drive at [facility] (Use the [employer] entrance to [facility] next to [facility] (Must self quarantine after testing. Follow instructions on handout.)  [place] [address] (Must self quarantine after testing. Follow instructions on handout.)       Your procedure is scheduled on:    Report to [facility] Main  Entrance   Report to Short Stay at AM    Report to admitting at AM   Call this number if you have problems the morning of surgery [phone]   Do not eat food or drink liquids :After Midnight.   Complete one Ensure drink the morning of surgery at       the day of surgery.   Drink 2 Ensure drinks the night before surgery.  Complete one Ensure drink the morning of surgery 3 hours prior to scheduled surgery.   Brush your teeth the morning of surgery.   Do NOT smoke after Midnight   Take these medicines the morning of surgery with A SIP OF WATER:   DO NOT TAKE ANY DIABETIC MEDICATIONS DAY OF YOUR SURGERY                               You may not have any metal on your body including hair pins, jewelry, and body piercings             Do not wear make-up, lotions, powders, perfumes/cologne, or deodorant             Do not wear nail polish.  Do not shave  48 hours prior to surgery.              Men may shave face and neck.   Do not bring valuables to the hospital. [place].   Contacts, dentures or bridgework may not be worn into surgery.   Bring small overnight bag day of surgery.    Patients discharged the day of surgery will not be allowed to drive home.   Name and phone number of your driver:   Special Instructions: Bring a copy of your healthcare power of attorney and living will documents         the day of surgery if you haven't scanned them in before.              Please read over the following fact sheets you were given: [facility] - Preparing for Surgery Before surgery, you can play an important role.  Because skin is not sterile, your skin needs to be as free of germs as possible.  You can reduce the number of germs on your skin by washing with CHG (chlorahexidine gluconate) soap before surgery.  CHG is an antiseptic cleaner which kills germs and bonds with the skin to continue killing germs even  after washing. Please DO NOT use if you have an allergy to CHG or antibacterial soaps.  If your skin becomes reddened/irritated stop using the CHG and inform your nurse when you arrive at Short Stay. Do not shave (including legs and underarms) for at least 48 hours prior to the first CHG shower.  You may shave your face/neck.  Please follow these instructions carefully:  1.  Shower with CHG Soap the night before surgery and the  morning of surgery.  2.  If you choose to wash your hair, wash your hair first as usual with your normal  shampoo.  3.  After you shampoo, rinse your hair and body thoroughly to remove the shampoo.                             4.  Use CHG as you would any other liquid soap.  You can apply chg directly to the skin and wash.  Gently with a scrungie or clean washcloth.  5.  Apply the CHG Soap to your body ONLY FROM THE NECK DOWN.   Do   not use on face/ open                           Wound or open sores. Avoid contact with eyes, ears mouth and   genitals (private parts).                       Wash face,   Genitals (private parts) with your normal soap.             6.  Wash thoroughly, paying special attention to the area where your    surgery  will be performed.  7.  Thoroughly rinse your body with warm water from the neck down.  8.  DO NOT shower/wash with your normal soap after using and rinsing off the CHG Soap.                9.  Pat yourself dry with a clean towel.            10.  Wear clean pajamas.            11.  Place clean sheets on your bed the night of your first shower and do not  sleep with pets. Day of Surgery : Do not apply any lotions/deodorants the morning of surgery.  Please wear clean clothes to the hospital/surgery center.  FAILURE TO FOLLOW THESE INSTRUCTIONS MAY RESULT IN THE CANCELLATION OF YOUR SURGERY  PATIENT SIGNATURE_________________________________  NURSE SIGNATURE__________________________________  ________________________________________________________________________

## 2019-09-22 ENCOUNTER — Other Ambulatory Visit: Payer: Self-pay

## 2019-09-22 ENCOUNTER — Encounter (HOSPITAL_COMMUNITY): Payer: Self-pay

## 2019-09-22 ENCOUNTER — Encounter (HOSPITAL_COMMUNITY)
Admission: RE | Admit: 2019-09-22 | Discharge: 2019-09-22 | Disposition: A | Discharge status: Home / Self Care | Payer: PPO | Source: Ambulatory Visit | Attending: Urology | Admitting: Urology

## 2019-09-22 DIAGNOSIS — Z01812 Encounter for preprocedural laboratory examination: Secondary | ICD-10-CM | POA: Diagnosis not present

## 2019-09-22 HISTORY — DX: Other specified postprocedural states: Z98.890

## 2019-09-22 HISTORY — DX: Unspecified hearing loss, unspecified ear: H91.90

## 2019-09-22 HISTORY — DX: Presence of other specified functional implants: Z96.89

## 2019-09-22 HISTORY — DX: Other specified disorders of kidney and ureter: N28.89

## 2019-09-22 NOTE — Progress Notes (Addendum)
Anesthesia Chart Review   Case: [id number] Date/Time: 09/27/19 0945   Procedure: HAND ASSISTED LAPAROSCOPIC NEPHRECTOMY (Right ) - 3 HRS   Anesthesia type: General   Pre-op diagnosis: RIGHT RENAL MASS   Location: [facility]   Surgeons: Robley Fries, MD      DISCUSSION:73 y.o. current every day smoker with h/o HTN, CVA 2019, loop recorder in place, MI 2019, PAF (pt declined anticoagulation), right renal mass scheduled for above procedure 09/27/2019 with Dr. Jacalyn Lefevre.   Pt last seen by cardiology 09/15/2019.  Per OV note, "Pt is an acceptable risk from a cardiac standpoint for proposed surgery without further cardiac work up.  He knows there is some risk being off Plavix and ASA but this appears to be unavoidable.  He would like to see Dr Sallyanne Kuster again post op and discuss anticoagulation again. I will fax official clearance to Dr Claudia Desanctis."  Anticipate pt can proceed with planned procedure barring acute status change.   VS: There were no vitals taken for this visit.  PROVIDERS: Townsend Roger, MD is PCP   Sanda Klein, MD is Cardiologist  LABS: Labs reviewed: Acceptable for surgery. (all labs ordered are listed, but only abnormal results are displayed)  Labs Reviewed - No data to display   IMAGES:   EKG: 09/15/2019 Rate 104 bpm Sinus tachycardia Possible left atrial enlargement   CV: Cardiac Cath 05/03/2018  Prox RCA to Mid RCA lesion is 50% stenosed.  Prox Cx lesion is 50% stenosed.  Mid Cx to Dist Cx lesion is 30% stenosed.  Prox LAD lesion is 35% stenosed.  The left ventricular systolic function is normal.  LV end diastolic pressure is normal.  The left ventricular ejection fraction is 55-65% by visual estimate.   1. Nonobstructive CAD 2. Normal LV function 3. Normal LVEDP  Plan: medical management.  Recommend Aspirin 81mg  daily for moderate CAD.  Echo 04/27/2018 Study Conclusions  - Left ventricle: The cavity size was normal.  Wall thickness was   increased in a pattern of mild LVH. Systolic function was normal.   The estimated ejection fraction was in the range of 55% to 60%.   Wall motion was normal; there were no regional wall motion   abnormalities. - Aortic valve: There was no stenosis. - Aorta: Normal caliber thoracic aorta with grade 3 plaque in   descending thoracic aorta. - Mitral valve: There was trivial regurgitation. - Left atrium: No evidence of thrombus in the atrial cavity or   appendage. - Right ventricle: The cavity size was normal. Systolic function   was normal. - Right atrium: No evidence of thrombus in the atrial cavity or   appendage. - Atrial septum: No defect or patent foramen ovale was identified.   Echo contrast study showed no right-to-left atrial level shunt,   at baseline or with provocation.  Impressions:  - No cardiac source of emboli was indentified. Past Medical History:  Diagnosis Date  . Angioedema    from Benazepril reaction  . Anxiety    takes Celexa  . Arthritis   . Chronic back pain   . CVA (cerebral vascular accident) ([place])    04/2018  . Enlarged prostate   . Fall 06/2019   fell and injured 3 vertebrae in his back   . History of blood transfusion   . History of loop recorder    in place   . HOH (hard of hearing)   . Hypertension   . MI (myocardial  infarction) ([place]) 04/2018  . Neuropathy    bilateral feet  . Renal mass, right   . Spinal cord stimulator status    09/22/2019--states stimulator is turned OFF    Past Surgical History:  Procedure Laterality Date  . CARDIAC CATHETERIZATION  05/03/2018  . COLONOSCOPY     one polyp removed - benign  . ELBOW SURGERY Right   . HERNIA REPAIR     umbilical  . JOINT REPLACEMENT Left    knee, x 2  . JOINT REPLACEMENT Right    hip  . JOINT REPLACEMENT Left    partial shoulder  . LEFT HEART CATH AND CORONARY ANGIOGRAPHY N/A 05/03/2018   Procedure: LEFT HEART CATH AND CORONARY ANGIOGRAPHY;  Surgeon: Martinique,  Peter M, MD;  Location: [facility];  Service: Cardiovascular;  Laterality: N/A;  . LOOP RECORDER INSERTION N/A 04/27/2018   Procedure: LOOP RECORDER INSERTION;  Surgeon: Evans Lance, MD;  Location: [facility];  Service: Cardiovascular;  Laterality: N/A;  . SPINAL CORD STIMULATOR INSERTION N/A 09/28/2014   Procedure: LUMBAR SPINAL CORD STIMULATOR INSERTION;  Surgeon: Melina Schools, MD;  Location: [facility];  Service: Orthopedics;  Laterality: N/A;  . TEE WITHOUT CARDIOVERSION N/A 04/27/2018   Procedure: TRANSESOPHAGEAL ECHOCARDIOGRAM (TEE);  Surgeon: Larey Dresser, MD;  Location: [facility];  Service: Cardiovascular;  Laterality: N/A;  . TONSILLECTOMY    . VASECTOMY      MEDICATIONS: . acetaminophen (TYLENOL) 325 MG tablet  . amLODipine (NORVASC) 10 MG tablet  . aspirin 325 MG tablet  . atorvastatin (LIPITOR) 40 MG tablet  . b complex vitamins tablet  . cetirizine (ZYRTEC) 10 MG tablet  . cholecalciferol (VITAMIN D) 1000 units tablet  . clopidogrel (PLAVIX) 75 MG tablet  . colchicine 0.6 MG tablet  . divalproex (DEPAKOTE) 500 MG DR tablet  . docusate sodium (COLACE) 100 MG capsule  . magnesium gluconate (MAGONATE) 500 MG tablet  . omeprazole (PRILOSEC) 20 MG capsule  . Oxycodone HCl 20 MG TABS  . polyethylene glycol (MIRALAX / GLYCOLAX) 17 g packet  . traZODone (DESYREL) 50 MG tablet   No current facility-administered medications for this encounter.   . fentaNYL (SUBLIMAZE) injection  . Heparin (Porcine) in NaCl 1000-0.9 UT/500ML-% SOLN  . Heparin (Porcine) in NaCl 1000-0.9 UT/500ML-% SOLN  . heparin injection  . iopamidol (ISOVUE-370) 76 % injection  . lidocaine (PF) (XYLOCAINE) 1 % injection  . midazolam (VERSED) injection  . Radial Cocktail/Verapamil only    Maia Plan [facility] Pre-Surgical Testing [phone] 09/22/19  2:28 PM

## 2019-09-22 NOTE — Progress Notes (Signed)
PCP - Nona Dell, Corene Cornea, MD Cardiologist - Croitoru, Mihai, MD, lov/cardiac clearance  with Kerin Ransom PA-C 12/31   Chest x-ray - 06/16/2019 epic EKG - 09/15/2019 epic Stress Test -  ECHO - 04/2018 epic Cardiac Cath -   Sleep Study - n/a CPAP -   Fasting Blood Sugar - n/a Checks Blood Sugar _____ times a day  Blood Thinner Instructions: PLAVIX, hold x 7 days , patient reports last dose 1/4 Aspirin Instructions: hold x 3-5 days , patient reports he stopped same day as PLAVIX 1/4 to avoid confusion  Last Dose:see above  Anesthesia review:   Hx of MI and CVA 04/2018, HTN, and Seizures. Denies any acute cardiac symptoms at this time.   Patient denies shortness of breath, fever, cough and chest pain at PAT appointment   Patient verbalized understanding of instructions that were given to them at the PAT appointment. Patient was also instructed that they will need to review over the PAT instructions again at home before surgery.

## 2019-09-23 ENCOUNTER — Encounter (HOSPITAL_COMMUNITY): Admission: RE | Admit: 2019-09-23 | Payer: PPO | Source: Ambulatory Visit

## 2019-09-23 ENCOUNTER — Other Ambulatory Visit (HOSPITAL_COMMUNITY)
Admission: RE | Admit: 2019-09-23 | Discharge: 2019-09-23 | Disposition: A | Discharge status: Home / Self Care | Payer: PPO | Source: Ambulatory Visit | Attending: Urology | Admitting: Urology

## 2019-09-23 DIAGNOSIS — Z20822 Contact with and (suspected) exposure to covid-19: Secondary | ICD-10-CM | POA: Diagnosis not present

## 2019-09-23 DIAGNOSIS — Z01812 Encounter for preprocedural laboratory examination: Secondary | ICD-10-CM | POA: Insufficient documentation

## 2019-09-24 LAB — NOVEL CORONAVIRUS, NAA (HOSP ORDER, SEND-OUT TO REF LAB; TAT 18-24 HRS): SARS-CoV-2, NAA: NOT DETECTED

## 2019-09-26 NOTE — H&P (Signed)
CC/HPI: Pt presents today for pre-operative history and physical exam in anticipation of right hand assisted lap nephrectomy by Dr. Claudia Desanctis on 09/27/19. He is doing well and is without complaint except for not sleeping well due to worry regarding his upcoming surgery.   He has an appt tomorrow with Dr. Rosalyn Gess his cardiologist for clearance for the surgery. Pt was instructed to discuss with cards the need to stop aspirin and plavix prior to his procedure. Pt denies F/C, HA, CP, SOB, N/V, diarrhea/constipation, back pain, flank pain, hematuria, and dysuria.     HX:   cc: right renal mass   08/09/19: 74 year old man here for discussion of a radical nephrectomy for 5 cm right lower pole mass. The mass was 1st seen March 2019 with interval increase in size on CT October 2020. Patient has previously seen Dr. Karsten Ro and they discussed management options including biopsy, cryo/rfa, and surgery. Based on patient age and size of kidney, he would like to proceed with a nephrectomy. Patient is unaware of any prior abdominal surgery but notes report umbilical hernia repair. He is experiencing mild right upper quadrant pain. He denies any gross hematuria. He does have intermittent swelling in his feet but his wife reports that attributed to gout. He is followed for chronic pain by Dr. Nicholaus Bloom at [facility] pain management.   07/27/19: He is seen today for further evaluation a right renal mass measuring 5 cm in the lower pole. He was seen on CT scan in 3/19 and had increased in size on a CT scan done on 06/28/19. He suffered a fall and broke several vertebral processes which is what prompted the scan. He said for 3 or 4 years he has had pain in his right he described as stabbing but was positional and he seemed to think it might have been from this renal mass however I indicated that was not likely the case.     ALLERGIES: benazepril - Anaphylaxis    MEDICATIONS: Aspirin 325 mg tablet  Omeprazole  Zyrtec 10 mg  capsule  Amlodipine Besylate 5 mg tablet Oral  Amoxicillin 500 mg capsule 1 capsule PO TID  Atorvastatin Calcium 40 mg tablet  Clopidogrel 75 mg tablet  Colace  Colchicine 0.6 mg capsule  Divalproex Sodium 500 mg tablet, delayed release  Magnesium 250 mg tablet  Morphine Sulfate Er 60 mg tablet, extended release Oral  Oxycodone Hcl 20 mg tablet  Saw [place]  Vitamin B Complex  Vitamin C  Vitamin D3     GU PSH: Interstim Stage 2 Generator - 2016 Rpr Umbil Hern; Reduc < 5 Yr - 2016 Vasectomy - 2016       [place] Notes: Peripheral Neurostimulator Placement Of Pulse Generator, Shoulder Surgery Left, Hip Replacement, Elbow Surgery, Umbilical Hernia Repair, Tonsillectomy, Surgery Of Male Genitalia Vasectomy, Knee Replacement, Complete Colonoscopy   NON-GU PSH: Diagnostic Colonoscopy - 2016 Remove Tonsils - 2016 Revise Knee Joint - 2016     GU PMH: Renal cyst, Left, A simple cyst was identified in the left kidney and is of no clinical significance. - 07/27/2019 Right renal neoplasm, Right, He has elected to proceed with surgery. I am going to schedule him for an appointment with Dr. Claudia Desanctis. - 07/27/2019 ED due to arterial insufficiency, Erectile dysfunction due to arterial insufficiency - 2016 Inflammatory Disease Prostate, Unspec, Prostatitis - 2016 BPH w/o LUTS, BPH (benign prostatic hypertrophy) - 2016      PMH Notes: BPH with urinary retention: He was seen in the ER on  05/24/15 with complaints of weakness and groin pain. He was found to have what was described as a tender prostate on exam. His white blood cell count was normal and his urinalysis was clear. The next day he was unable to urinate and had to go to the emergency room where he was in and out catheter for greater than 1 L. After that he voided without further difficulty but reports that one time in the past he developed urinary retention after surgery that was treated with in and out catheterization as well.  Treatment:  tamsulosin.     MI and TIA Oct 2019 with subsequent cardiac cath that did not reveal any blockage and therefore no intervention.     NON-GU PMH: Anxiety, Anxiety - 2016 Encounter for general adult medical examination without abnormal findings, Encounter for preventive health examination - 2016 Personal history of other diseases of the circulatory system, History of hypertension - 2016 Personal history of other diseases of the musculoskeletal system and connective tissue, History of arthritis - 2016 Personal history of other diseases of the nervous system and sense organs, History of neuropathy - 2016 Arthritis GERD Gout Heart disease, unspecified Hypertension Stroke/TIA    FAMILY HISTORY: 3 Son's - Son cardiac disorder - Runs In Family    Notes: maternal Grandfather died from CaP   SOCIAL HISTORY: Marital Status: Married Preferred Language: English; Ethnicity: Not Hispanic Or Latino; Race: White Current Smoking Status: Patient does not smoke anymore.   Tobacco Use Assessment Completed: Used Tobacco in last 30 days? Does not use smokeless tobacco. Does drink.  Does not use drugs. Drinks 3 caffeinated drinks per day. Has not had a blood transfusion.     Notes: Quit smoking 10 or more years ago; vapes one pod per day; quit snuff years ago;  2-3 Shots of liquor per week   REVIEW OF SYSTEMS:    GU Review Male:   Patient denies frequent urination, hard to postpone urination, burning/ pain with urination, get up at night to urinate, leakage of urine, stream starts and stops, trouble starting your stream, have to strain to urinate , erection problems, and penile pain.  Gastrointestinal (Upper):   Patient denies nausea, vomiting, and indigestion/ heartburn.  Gastrointestinal (Lower):   Patient denies diarrhea and constipation.  Constitutional:   Patient denies fever, night sweats, weight loss, and fatigue.  Skin:   Patient denies skin rash/ lesion and itching.  Eyes:   Patient  denies blurred vision and double vision.  Ears/ Nose/ Throat:   Patient denies sore throat and sinus problems.  Hematologic/Lymphatic:   Patient denies swollen glands and easy bruising.  Cardiovascular:   Patient denies leg swelling and chest pains.  Respiratory:   Patient denies cough and shortness of breath.  Endocrine:   Patient denies excessive thirst.  Musculoskeletal:   Patient denies back pain and joint pain.  Neurological:   Patient denies headaches and dizziness.  Psychologic:   Patient denies depression and anxiety.   Notes: abdomen swelling    VITAL SIGNS:      09/13/2019 03:27 PM  BP 129/77 mmHg  Heart Rate 85 /min  Temperature 98.2 F / 36.7 C   MULTI-SYSTEM PHYSICAL EXAMINATION:    Constitutional: Well-nourished. No physical deformities. Normally developed. Good grooming. In a wheel chair  Neck: Neck symmetrical, not swollen. Normal tracheal position.  Respiratory: Normal breath sounds. No labored breathing, no use of accessory muscles.   Cardiovascular: Regular rate and rhythm. No murmur, no gallop.   Lymphatic:  No enlargement of neck, axillae, groin.  Skin: No paleness, no jaundice, no cyanosis. No lesion, no ulcer, no rash.  Neurologic / Psychiatric: Oriented to time, oriented to place, oriented to person. No depression, no anxiety, no agitation.  Gastrointestinal: No mass, no tenderness, no rigidity, non obese abdomen.  Eyes: Normal conjunctivae. Normal eyelids.  Ears, Nose, Mouth, and Throat: Left ear no scars, no lesions, no masses. Right ear no scars, no lesions, no masses. Nose no scars, no lesions, no masses. Normal hearing. Normal lips.  Musculoskeletal: Normal station of head and neck.     PAST DATA REVIEWED:  Source Of History:  Patient  Records Review:   Previous Patient Records  Urine Test Review:   Urinalysis   09/13/19  Urinalysis  Urine Appearance Clear   Urine Color Yellow   Urine Glucose Neg mg/dL  Urine Bilirubin Neg mg/dL  Urine Ketones Neg  mg/dL  Urine Specific Gravity 1.025   Urine Blood Neg ery/uL  Urine pH 5.5   Urine Protein Neg mg/dL  Urine Urobilinogen 0.2 mg/dL  Urine Nitrites Neg   Urine Leukocyte Esterase Neg leu/uL   PROCEDURES: None   ASSESSMENT:      ICD-10 Details  1 GU:   Right renal neoplasm - D49.511 Right   PLAN:           Schedule Return Visit/Planned Activity: Keep Scheduled Appointment - Schedule Surgery          Document Letter(s):  Created for Patient: Clinical Summary         Notes:   There are no changes in the patients history or physical exam since last evaluation by Dr. Claudia Desanctis. Pt is scheduled to undergo right hand assisted lap nephrectomy on 09/27/19.   He and his wife will ensure all information for his cardiac eval is forwarded to Dr. Claudia Desanctis after his eval tomorrow (09/14/19).   All pt's questions were answered to the best of my ability.          Next Appointment:      Next Appointment: 09/27/2019 10:00 AM    Appointment Type: Surgery     Location: [facility] [phone]    Provider: Jacalyn Lefevre, M.D.    Reason for Visit: [facility] RIGHT H A LAP NEPHRECTOMY , DR. BELL TO ASSIST

## 2019-09-27 ENCOUNTER — Inpatient Hospital Stay (HOSPITAL_COMMUNITY): Payer: PPO | Admitting: Anesthesiology

## 2019-09-27 ENCOUNTER — Other Ambulatory Visit: Payer: Self-pay

## 2019-09-27 ENCOUNTER — Inpatient Hospital Stay (HOSPITAL_COMMUNITY): Payer: PPO

## 2019-09-27 ENCOUNTER — Encounter (HOSPITAL_COMMUNITY)
Admission: RE | Disposition: A | Discharge status: Home Health | Payer: Self-pay | Source: Home / Self Care | Attending: Urology

## 2019-09-27 ENCOUNTER — Inpatient Hospital Stay (HOSPITAL_COMMUNITY): Payer: PPO | Admitting: Physician Assistant

## 2019-09-27 ENCOUNTER — Inpatient Hospital Stay (HOSPITAL_COMMUNITY)
Admission: RE | Admit: 2019-09-27 | Discharge: 2019-10-01 | DRG: 657 | Disposition: A | Discharge status: Home Health | Payer: PPO | Attending: Urology | Admitting: Urology

## 2019-09-27 ENCOUNTER — Encounter (HOSPITAL_COMMUNITY): Payer: Self-pay | Admitting: Urology

## 2019-09-27 DIAGNOSIS — Z7982 Long term (current) use of aspirin: Secondary | ICD-10-CM | POA: Diagnosis not present

## 2019-09-27 DIAGNOSIS — M1990 Unspecified osteoarthritis, unspecified site: Secondary | ICD-10-CM | POA: Diagnosis not present

## 2019-09-27 DIAGNOSIS — G894 Chronic pain syndrome: Secondary | ICD-10-CM | POA: Diagnosis not present

## 2019-09-27 DIAGNOSIS — Z888 Allergy status to other drugs, medicaments and biological substances status: Secondary | ICD-10-CM | POA: Diagnosis not present

## 2019-09-27 DIAGNOSIS — G629 Polyneuropathy, unspecified: Secondary | ICD-10-CM | POA: Diagnosis not present

## 2019-09-27 DIAGNOSIS — C641 Malignant neoplasm of right kidney, except renal pelvis: Principal | ICD-10-CM | POA: Diagnosis present

## 2019-09-27 DIAGNOSIS — K567 Ileus, unspecified: Secondary | ICD-10-CM | POA: Diagnosis not present

## 2019-09-27 DIAGNOSIS — N2889 Other specified disorders of kidney and ureter: Secondary | ICD-10-CM | POA: Diagnosis not present

## 2019-09-27 DIAGNOSIS — Z79899 Other long term (current) drug therapy: Secondary | ICD-10-CM | POA: Diagnosis not present

## 2019-09-27 DIAGNOSIS — I252 Old myocardial infarction: Secondary | ICD-10-CM | POA: Diagnosis not present

## 2019-09-27 DIAGNOSIS — I10 Essential (primary) hypertension: Secondary | ICD-10-CM | POA: Diagnosis present

## 2019-09-27 DIAGNOSIS — R338 Other retention of urine: Secondary | ICD-10-CM | POA: Diagnosis not present

## 2019-09-27 DIAGNOSIS — Z8673 Personal history of transient ischemic attack (TIA), and cerebral infarction without residual deficits: Secondary | ICD-10-CM

## 2019-09-27 DIAGNOSIS — H9190 Unspecified hearing loss, unspecified ear: Secondary | ICD-10-CM | POA: Diagnosis not present

## 2019-09-27 DIAGNOSIS — Z5331 Laparoscopic surgical procedure converted to open procedure: Secondary | ICD-10-CM | POA: Diagnosis not present

## 2019-09-27 DIAGNOSIS — N401 Enlarged prostate with lower urinary tract symptoms: Secondary | ICD-10-CM | POA: Diagnosis not present

## 2019-09-27 DIAGNOSIS — G8918 Other acute postprocedural pain: Secondary | ICD-10-CM | POA: Diagnosis not present

## 2019-09-27 DIAGNOSIS — Z87892 Personal history of anaphylaxis: Secondary | ICD-10-CM | POA: Diagnosis not present

## 2019-09-27 DIAGNOSIS — D49511 Neoplasm of unspecified behavior of right kidney: Secondary | ICD-10-CM | POA: Diagnosis not present

## 2019-09-27 DIAGNOSIS — Z87891 Personal history of nicotine dependence: Secondary | ICD-10-CM | POA: Diagnosis not present

## 2019-09-27 DIAGNOSIS — Z419 Encounter for procedure for purposes other than remedying health state, unspecified: Secondary | ICD-10-CM

## 2019-09-27 DIAGNOSIS — G8929 Other chronic pain: Secondary | ICD-10-CM | POA: Diagnosis not present

## 2019-09-27 DIAGNOSIS — M109 Gout, unspecified: Secondary | ICD-10-CM | POA: Diagnosis not present

## 2019-09-27 DIAGNOSIS — Z515 Encounter for palliative care: Secondary | ICD-10-CM | POA: Diagnosis not present

## 2019-09-27 DIAGNOSIS — Z9682 Presence of neurostimulator: Secondary | ICD-10-CM | POA: Diagnosis not present

## 2019-09-27 DIAGNOSIS — N179 Acute kidney failure, unspecified: Secondary | ICD-10-CM | POA: Diagnosis not present

## 2019-09-27 HISTORY — PX: LAPAROSCOPIC NEPHRECTOMY, HAND ASSISTED: SHX1929

## 2019-09-27 LAB — TYPE AND SCREEN
ABO/RH(D): O POS
Antibody Screen: NEGATIVE

## 2019-09-27 LAB — ABO/RH: ABO/RH(D): O POS

## 2019-09-27 LAB — PROTIME-INR
INR: 1 (ref 0.8–1.2)
Prothrombin Time: 13 seconds (ref 11.4–15.2)

## 2019-09-27 SURGERY — NEPHRECTOMY, HAND-ASSISTED, LAPAROSCOPIC
Anesthesia: General | Laterality: Right

## 2019-09-27 MED ORDER — SUGAMMADEX SODIUM 200 MG/2ML IV SOLN
INTRAVENOUS | Status: DC | PRN
  Administered 2019-09-27 (×2): 200 mg via INTRAVENOUS

## 2019-09-27 MED ORDER — DEXAMETHASONE SODIUM PHOSPHATE 10 MG/ML IJ SOLN
INTRAMUSCULAR | Status: DC | PRN
  Administered 2019-09-27: 4 mg via INTRAVENOUS

## 2019-09-27 MED ORDER — OXYCODONE HCL 5 MG PO TABS
ORAL_TABLET | ORAL | Status: AC
  Filled 2019-09-27: qty 1

## 2019-09-27 MED ORDER — AMLODIPINE BESYLATE 10 MG PO TABS
10.0000 mg | ORAL_TABLET | Freq: Every day | ORAL | Status: DC
  Administered 2019-09-28 – 2019-10-01 (×4): 10 mg via ORAL
  Filled 2019-09-27 (×4): qty 1

## 2019-09-27 MED ORDER — HYDROMORPHONE HCL 1 MG/ML IJ SOLN
INTRAMUSCULAR | Status: AC
  Filled 2019-09-27: qty 2

## 2019-09-27 MED ORDER — PHENYLEPHRINE HCL-NACL 10-0.9 MG/250ML-% IV SOLN
INTRAVENOUS | Status: DC | PRN
  Administered 2019-09-27: 7 ug/min via INTRAVENOUS

## 2019-09-27 MED ORDER — LIDOCAINE 2% (20 MG/ML) 5 ML SYRINGE
INTRAMUSCULAR | Status: AC
  Filled 2019-09-27: qty 10

## 2019-09-27 MED ORDER — DEXTROSE-NACL 5-0.45 % IV SOLN: INTRAVENOUS | Status: DC

## 2019-09-27 MED ORDER — FENTANYL CITRATE (PF) 100 MCG/2ML IJ SOLN
INTRAMUSCULAR | Status: DC | PRN
  Administered 2019-09-27 (×7): 50 ug via INTRAVENOUS

## 2019-09-27 MED ORDER — TRAZODONE HCL 50 MG PO TABS
50.0000 mg | ORAL_TABLET | Freq: Every evening | ORAL | Status: DC | PRN
  Administered 2019-09-28 – 2019-09-30 (×3): 50 mg via ORAL
  Filled 2019-09-27 (×3): qty 1

## 2019-09-27 MED ORDER — OXYCODONE HCL 5 MG PO TABS
5.0000 mg | ORAL_TABLET | Freq: Once | ORAL | Status: AC | PRN
  Administered 2019-09-27: 17:00:00 5 mg via ORAL

## 2019-09-27 MED ORDER — LACTATED RINGERS IV SOLN: INTRAVENOUS | Status: DC

## 2019-09-27 MED ORDER — CHLORHEXIDINE GLUCONATE CLOTH 2 % EX PADS
6.0000 | MEDICATED_PAD | Freq: Every day | CUTANEOUS | Status: DC
  Administered 2019-09-28 – 2019-09-30 (×4): 6 via TOPICAL

## 2019-09-27 MED ORDER — BELLADONNA ALKALOIDS-OPIUM 16.2-60 MG RE SUPP
RECTAL | Status: AC
  Filled 2019-09-27: qty 1

## 2019-09-27 MED ORDER — PROPOFOL 10 MG/ML IV BOLUS
INTRAVENOUS | Status: AC
  Filled 2019-09-27: qty 40

## 2019-09-27 MED ORDER — CEFAZOLIN SODIUM-DEXTROSE 2-4 GM/100ML-% IV SOLN
2.0000 g | Freq: Three times a day (TID) | INTRAVENOUS | Status: AC
  Administered 2019-09-27 – 2019-09-28 (×2): 2 g via INTRAVENOUS
  Filled 2019-09-27 (×2): qty 100

## 2019-09-27 MED ORDER — ESMOLOL HCL 100 MG/10ML IV SOLN
INTRAVENOUS | Status: DC | PRN
  Administered 2019-09-27 (×2): 10 mg via INTRAVENOUS

## 2019-09-27 MED ORDER — LORATADINE 10 MG PO TABS: 10.0000 mg | ORAL_TABLET | Freq: Every day | ORAL | Status: DC

## 2019-09-27 MED ORDER — ACETAMINOPHEN 325 MG PO TABS: 650.0000 mg | ORAL_TABLET | ORAL | Status: DC | PRN

## 2019-09-27 MED ORDER — ONDANSETRON HCL 4 MG/2ML IJ SOLN: 4.0000 mg | INTRAMUSCULAR | Status: DC | PRN

## 2019-09-27 MED ORDER — ROCURONIUM BROMIDE 100 MG/10ML IV SOLN
INTRAVENOUS | Status: DC | PRN
  Administered 2019-09-27: 80 mg via INTRAVENOUS
  Administered 2019-09-27: 15 mg via INTRAVENOUS
  Administered 2019-09-27 (×2): 20 mg via INTRAVENOUS
  Administered 2019-09-27: 10 mg via INTRAVENOUS

## 2019-09-27 MED ORDER — LIDOCAINE HCL (PF) 2 % IJ SOLN
INTRAMUSCULAR | Status: DC | PRN
  Administered 2019-09-27: 20 mg via INTRADERMAL
  Administered 2019-09-27: 40 mg via INTRADERMAL
  Administered 2019-09-27: 80 mg via INTRADERMAL

## 2019-09-27 MED ORDER — MAGNESIUM GLUCONATE 500 MG PO TABS
500.0000 mg | ORAL_TABLET | Freq: Two times a day (BID) | ORAL | Status: DC
  Administered 2019-09-28 – 2019-10-01 (×8): 500 mg via ORAL
  Filled 2019-09-27 (×8): qty 1

## 2019-09-27 MED ORDER — FENTANYL CITRATE (PF) 250 MCG/5ML IJ SOLN
INTRAMUSCULAR | Status: AC
  Filled 2019-09-27: qty 5

## 2019-09-27 MED ORDER — ROCURONIUM BROMIDE 10 MG/ML (PF) SYRINGE
PREFILLED_SYRINGE | INTRAVENOUS | Status: AC
  Filled 2019-09-27: qty 10

## 2019-09-27 MED ORDER — KETAMINE HCL 10 MG/ML IJ SOLN
INTRAMUSCULAR | Status: AC
  Filled 2019-09-27: qty 1

## 2019-09-27 MED ORDER — BUPIVACAINE LIPOSOME 1.3 % IJ SUSP
20.0000 mL | Freq: Once | INTRAMUSCULAR | Status: DC
  Filled 2019-09-27: qty 20

## 2019-09-27 MED ORDER — ONDANSETRON HCL 4 MG/2ML IJ SOLN
INTRAMUSCULAR | Status: AC
  Filled 2019-09-27: qty 2

## 2019-09-27 MED ORDER — ONDANSETRON HCL 4 MG/2ML IJ SOLN
INTRAMUSCULAR | Status: DC | PRN
  Administered 2019-09-27: 4 mg via INTRAVENOUS

## 2019-09-27 MED ORDER — OXYCODONE HCL 5 MG PO TABS
20.0000 mg | ORAL_TABLET | Freq: Three times a day (TID) | ORAL | Status: DC | PRN
  Filled 2019-09-27: qty 4

## 2019-09-27 MED ORDER — BELLADONNA ALKALOIDS-OPIUM 16.2-60 MG RE SUPP
1.0000 | Freq: Once | RECTAL | Status: AC
  Administered 2019-09-27: 16:00:00 1 via RECTAL

## 2019-09-27 MED ORDER — PANTOPRAZOLE SODIUM 40 MG PO TBEC
40.0000 mg | DELAYED_RELEASE_TABLET | Freq: Every day | ORAL | Status: DC
  Administered 2019-09-28 – 2019-10-01 (×4): 40 mg via ORAL
  Filled 2019-09-27 (×4): qty 1

## 2019-09-27 MED ORDER — PROPOFOL 500 MG/50ML IV EMUL
INTRAVENOUS | Status: DC | PRN
  Administered 2019-09-27: 10 ug/kg/min via INTRAVENOUS

## 2019-09-27 MED ORDER — BUPIVACAINE LIPOSOME 1.3 % IJ SUSP
INTRAMUSCULAR | Status: DC | PRN
  Administered 2019-09-27: 20 mL

## 2019-09-27 MED ORDER — ATORVASTATIN CALCIUM 40 MG PO TABS
40.0000 mg | ORAL_TABLET | Freq: Every day | ORAL | Status: DC
  Administered 2019-09-27 – 2019-09-30 (×4): 40 mg via ORAL
  Filled 2019-09-27 (×4): qty 1

## 2019-09-27 MED ORDER — PROMETHAZINE HCL 25 MG/ML IJ SOLN: 6.2500 mg | INTRAMUSCULAR | Status: DC | PRN

## 2019-09-27 MED ORDER — SODIUM CHLORIDE (PF) 0.9 % IJ SOLN
INTRAMUSCULAR | Status: DC | PRN
  Administered 2019-09-27: 20 mL

## 2019-09-27 MED ORDER — SODIUM CHLORIDE (PF) 0.9 % IJ SOLN
INTRAMUSCULAR | Status: AC
  Filled 2019-09-27: qty 50

## 2019-09-27 MED ORDER — LACTATED RINGERS IV BOLUS
500.0000 mL | Freq: Once | INTRAVENOUS | Status: AC
  Administered 2019-09-27: 16:00:00 500 mL via INTRAVENOUS

## 2019-09-27 MED ORDER — PHENYLEPHRINE 40 MCG/ML (10ML) SYRINGE FOR IV PUSH (FOR BLOOD PRESSURE SUPPORT)
PREFILLED_SYRINGE | INTRAVENOUS | Status: AC
  Filled 2019-09-27: qty 10

## 2019-09-27 MED ORDER — LORATADINE 10 MG PO TABS
10.0000 mg | ORAL_TABLET | Freq: Every day | ORAL | Status: DC
  Administered 2019-09-28 – 2019-10-01 (×4): 10 mg via ORAL
  Filled 2019-09-27 (×4): qty 1

## 2019-09-27 MED ORDER — SENNA 8.6 MG PO TABS
1.0000 | ORAL_TABLET | Freq: Two times a day (BID) | ORAL | Status: DC
  Administered 2019-09-27 – 2019-10-01 (×8): 8.6 mg via ORAL
  Filled 2019-09-27 (×8): qty 1

## 2019-09-27 MED ORDER — DEXAMETHASONE SODIUM PHOSPHATE 10 MG/ML IJ SOLN
INTRAMUSCULAR | Status: AC
  Filled 2019-09-27: qty 1

## 2019-09-27 MED ORDER — COLCHICINE 0.6 MG PO TABS
0.6000 mg | ORAL_TABLET | Freq: Two times a day (BID) | ORAL | Status: DC
  Administered 2019-09-27 – 2019-10-01 (×8): 0.6 mg via ORAL
  Filled 2019-09-27 (×8): qty 1

## 2019-09-27 MED ORDER — FENTANYL CITRATE (PF) 100 MCG/2ML IJ SOLN
INTRAMUSCULAR | Status: AC
  Filled 2019-09-27: qty 2

## 2019-09-27 MED ORDER — DIVALPROEX SODIUM 250 MG PO DR TAB
500.0000 mg | DELAYED_RELEASE_TABLET | Freq: Two times a day (BID) | ORAL | Status: DC
  Administered 2019-09-27 – 2019-10-01 (×8): 500 mg via ORAL
  Filled 2019-09-27 (×9): qty 2

## 2019-09-27 MED ORDER — LACTATED RINGERS IV SOLN: INTRAVENOUS | Status: DC | PRN

## 2019-09-27 MED ORDER — LACTATED RINGERS IR SOLN
Status: DC | PRN
  Administered 2019-09-27: 1000 mL

## 2019-09-27 MED ORDER — GLYCOPYRROLATE PF 0.2 MG/ML IJ SOSY
PREFILLED_SYRINGE | INTRAMUSCULAR | Status: AC
  Filled 2019-09-27: qty 1

## 2019-09-27 MED ORDER — DOCUSATE SODIUM 100 MG PO CAPS
100.0000 mg | ORAL_CAPSULE | Freq: Two times a day (BID) | ORAL | Status: DC
  Administered 2019-09-27 – 2019-10-01 (×8): 100 mg via ORAL
  Filled 2019-09-27 (×8): qty 1

## 2019-09-27 MED ORDER — OXYCODONE HCL 5 MG PO TABS
20.0000 mg | ORAL_TABLET | Freq: Three times a day (TID) | ORAL | Status: DC | PRN
  Administered 2019-09-27 – 2019-09-28 (×2): 20 mg via ORAL
  Filled 2019-09-27 (×3): qty 4

## 2019-09-27 MED ORDER — GLYCOPYRROLATE 0.2 MG/ML IJ SOLN
INTRAMUSCULAR | Status: DC | PRN
  Administered 2019-09-27: .1 mg via INTRAVENOUS

## 2019-09-27 MED ORDER — OXYCODONE HCL 5 MG/5ML PO SOLN: 5.0000 mg | Freq: Once | ORAL | Status: AC | PRN

## 2019-09-27 MED ORDER — KETAMINE HCL 10 MG/ML IJ SOLN
INTRAMUSCULAR | Status: DC | PRN
  Administered 2019-09-27 (×2): 10 mg via INTRAVENOUS

## 2019-09-27 MED ORDER — PROPOFOL 10 MG/ML IV BOLUS
INTRAVENOUS | Status: DC | PRN
  Administered 2019-09-27: 130 mg via INTRAVENOUS

## 2019-09-27 MED ORDER — HYDRALAZINE HCL 20 MG/ML IJ SOLN
5.0000 mg | Freq: Once | INTRAMUSCULAR | Status: AC
  Administered 2019-09-27: 20:00:00 5 mg via INTRAVENOUS
  Filled 2019-09-27: qty 1

## 2019-09-27 MED ORDER — CEFAZOLIN SODIUM-DEXTROSE 2-4 GM/100ML-% IV SOLN
2.0000 g | INTRAVENOUS | Status: AC
  Administered 2019-09-27: 2 g via INTRAVENOUS
  Filled 2019-09-27: qty 100

## 2019-09-27 MED ORDER — OXYCODONE HCL 5 MG PO TABS: 5.0000 mg | ORAL_TABLET | ORAL | Status: DC | PRN

## 2019-09-27 MED ORDER — HYDROMORPHONE HCL 1 MG/ML IJ SOLN
0.2500 mg | INTRAMUSCULAR | Status: DC | PRN
  Administered 2019-09-27 (×4): 0.5 mg via INTRAVENOUS

## 2019-09-27 MED ORDER — HYDROMORPHONE HCL 1 MG/ML IJ SOLN
0.5000 mg | INTRAMUSCULAR | Status: DC | PRN
  Administered 2019-09-27 – 2019-09-28 (×8): 1 mg via INTRAVENOUS
  Filled 2019-09-27 (×8): qty 1

## 2019-09-27 SURGICAL SUPPLY — 50 items
BAG LAPAROSCOPIC 12 15 PORT 16 (BASKET) ×1 IMPLANT
BAG RETRIEVAL 12/15 (BASKET) ×2
BAG RETRIEVAL 12/15MM (BASKET) ×1
BAG ZIPLOCK 12X15 (MISCELLANEOUS) ×3 IMPLANT
BLADE EXTENDED COATED 6.5IN (ELECTRODE) IMPLANT
BLADE SURG SZ10 CARB STEEL (BLADE) IMPLANT
CHLORAPREP W/TINT 26 (MISCELLANEOUS) ×3 IMPLANT
CLIP VESOLOCK LG 6/CT PURPLE (CLIP) ×3 IMPLANT
CLIP VESOLOCK MED LG 6/CT (CLIP) ×3 IMPLANT
CLIP VESOLOCK XL 6/CT (CLIP) IMPLANT
COVER SURGICAL LIGHT HANDLE (MISCELLANEOUS) ×3 IMPLANT
COVER WAND RF STERILE (DRAPES) IMPLANT
CUTTER FLEX LINEAR 45M (STAPLE) ×3 IMPLANT
DERMABOND ADVANCED (GAUZE/BANDAGES/DRESSINGS) ×2
DERMABOND ADVANCED .7 DNX12 (GAUZE/BANDAGES/DRESSINGS) ×1 IMPLANT
DRAPE INCISE IOBAN 66X45 STRL (DRAPES) ×6 IMPLANT
DRAPE LAPAROSCOPIC ABDOMINAL (DRAPES) ×3 IMPLANT
DRAPE WARM FLUID 44X44 (DRAPES) IMPLANT
DRSG TELFA 4X8 ISLAND (GAUZE/BANDAGES/DRESSINGS) ×3 IMPLANT
ELECT REM PT RETURN 15FT ADLT (MISCELLANEOUS) ×3 IMPLANT
GLOVE BIO SURGEON STRL SZ 6.5 (GLOVE) ×2 IMPLANT
GLOVE BIO SURGEONS STRL SZ 6.5 (GLOVE) ×1
GLOVE BIOGEL M STRL SZ7.5 (GLOVE) ×3 IMPLANT
GOWN STRL REUS W/TWL LRG LVL3 (GOWN DISPOSABLE) ×6 IMPLANT
HEMOSTAT SURGICEL 4X8 (HEMOSTASIS) IMPLANT
IRRIG SUCT STRYKERFLOW 2 WTIP (MISCELLANEOUS) ×3
IRRIGATION SUCT STRKRFLW 2 WTP (MISCELLANEOUS) ×1 IMPLANT
KIT BASIN OR (CUSTOM PROCEDURE TRAY) ×3 IMPLANT
KIT TURNOVER KIT A (KITS) IMPLANT
LIGASURE VESSEL 5MM BLUNT TIP (ELECTROSURGICAL) ×3 IMPLANT
MARKER SKIN SURG 5.25 VIO NS (MISCELLANEOUS) ×3 IMPLANT
RELOAD 45 VASCULAR/THIN (ENDOMECHANICALS) ×6 IMPLANT
SCISSORS LAP 5X35 DISP (ENDOMECHANICALS) IMPLANT
SET TUBE SMOKE EVAC HIGH FLOW (TUBING) IMPLANT
SHEARS HARMONIC ACE PLUS 36CM (ENDOMECHANICALS) ×3 IMPLANT
STAPLER VISISTAT 35W (STAPLE) ×3 IMPLANT
SURGIFLO W/THROMBIN 8M KIT (HEMOSTASIS) IMPLANT
SUT MNCRL AB 4-0 PS2 18 (SUTURE) ×6 IMPLANT
SUT PDS AB 1 CTX 36 (SUTURE) ×6 IMPLANT
SUT VIC AB 2-0 SH 27 (SUTURE)
SUT VIC AB 2-0 SH 27X BRD (SUTURE) IMPLANT
SUT VICRYL 0 UR6 27IN ABS (SUTURE) ×3 IMPLANT
TOWEL OR 17X26 10 PK STRL BLUE (TOWEL DISPOSABLE) ×3 IMPLANT
TOWEL OR NON WOVEN STRL DISP B (DISPOSABLE) ×3 IMPLANT
TRAY FOLEY MTR SLVR 16FR STAT (SET/KITS/TRAYS/PACK) ×3 IMPLANT
TRAY LAPAROSCOPIC (CUSTOM PROCEDURE TRAY) ×3 IMPLANT
TROCAR BLADELESS OPT 5 100 (ENDOMECHANICALS) ×3 IMPLANT
TROCAR XCEL 12X100 BLDLESS (ENDOMECHANICALS) ×3 IMPLANT
TROCAR XCEL BLUNT TIP 100MML (ENDOMECHANICALS) ×3 IMPLANT
YANKAUER SUCT BULB TIP 10FT TU (MISCELLANEOUS) IMPLANT

## 2019-09-27 NOTE — Transfer of Care (Signed)
Immediate Anesthesia Transfer of Care Note  Patient: William Jordan  Procedure(s) Performed: HAND ASSISTED LAPAROSCOPIC NEPHRECTOMY (Right )  Patient Location: PACU  Anesthesia Type:General  Level of Consciousness: awake, alert , oriented and patient cooperative  Airway & Oxygen Therapy: Patient Spontanous Breathing and Patient connected to nasal cannula oxygen  Post-op Assessment: Report given to RN and Post -op Vital signs reviewed and stable  Post vital signs: Reviewed and stable  Last Vitals:  Vitals Value Taken Time  BP 194/94 09/27/19 1315  Temp 35.9 C 09/27/19 1313  Pulse 70 09/27/19 1313  Resp 14 09/27/19 1317  SpO2    Vitals shown include unvalidated device data.  Last Pain:  Vitals:   09/27/19 0833  TempSrc:   PainSc: 4       Patients Stated Pain Goal: 3 ([id number])  Complications: No apparent anesthesia complications

## 2019-09-27 NOTE — Interval H&P Note (Signed)
History and Physical Interval Note: Patient understands risks and benefits of procedure including but not limited to bleeding, infection, damage to surrounding structures, need for future treatment or additional surgery, inability to complete procedure, pain, anesthesia risks, major surgery risks, possible blood transfusion.  Patient has signed informed consent.   09/27/2019 9:30 AM  William Jordan  has presented today for surgery, with the diagnosis of RIGHT RENAL MASS.  The various methods of treatment have been discussed with the patient and family. After consideration of risks, benefits and other options for treatment, the patient has consented to  Procedure(s) with comments: HAND ASSISTED LAPAROSCOPIC NEPHRECTOMY (Right) - 3 HRS as a surgical intervention.  The patient's history has been reviewed, patient examined, no change in status, stable for surgery.  I have reviewed the patient's chart and labs.  Questions were answered to the patient's satisfaction.     Claudia Alvizo D Tashunda Vandezande

## 2019-09-27 NOTE — Progress Notes (Signed)
   09/27/19 1917  MEWS Score  Temp 98 F (36.7 C)  BP (!) 162/102  Pulse Rate (!) 111  Resp 18  SpO2 99 %  O2 Device Nasal Cannula  MEWS Score  MEWS Temp 0  MEWS Systolic 0  MEWS Pulse 2  MEWS RR 0  MEWS LOC 1  MEWS Score 3  MEWS Score Color Yellow  MEWS Assessment  Is this an acute change? No  MEWS guidelines implemented *See [place]  Provider Notification  Provider Name/Title Felipa Furnace PA  Date Provider Notified 09/27/19  Time Provider Notified 1920  Notification Type Page  Notification Reason Other (Comment) (BP still elevated, no change)  Response See new orders (hydralazine IV ordered)  Date of Provider Response 09/27/19  Time of Provider Response 1920   After patient received IV pain medication, patient was resting comfortably in bed. At times, patient would wake up briefly and say he was hurting but then go right back to sleep. RN continued to monitor patient's elevated BP and heart rate which remained high. See VS above. Felipa Furnace, PA, paged regarding no change in VS. New orders received for IV hydralazine.  Will continue to monitor.

## 2019-09-27 NOTE — Anesthesia Preprocedure Evaluation (Addendum)
Anesthesia Evaluation  Patient identified by MRN, date of birth, ID band Patient awake    Reviewed: Allergy & Precautions, NPO status , Patient's Chart, lab work & pertinent test results, reviewed documented beta blocker date and time   Airway Mallampati: II  TM Distance: >3 FB Neck ROM: Full    Dental no notable dental hx. (+) Teeth Intact   Pulmonary neg pulmonary ROS, Current Smoker,    Pulmonary exam normal breath sounds clear to auscultation       Cardiovascular hypertension, Pt. on medications + Past MI  Normal cardiovascular exam Rhythm:Regular Rate:Normal     Neuro/Psych Seizures -,  Anxiety Depression CVA negative psych ROS   GI/Hepatic negative GI ROS, Neg liver ROS,   Endo/Other  negative endocrine ROS  Renal/GU Renal disease  negative genitourinary   Musculoskeletal  (+) Arthritis , Osteoarthritis,    Abdominal (+)  Abdomen: soft.    Peds negative pediatric ROS (+)  Hematology negative hematology ROS (+)   Anesthesia Other Findings   Reproductive/Obstetrics negative OB ROS                            Anesthesia Physical  Anesthesia Plan  ASA: III  Anesthesia Plan: General   Post-op Pain Management:    Induction: Intravenous  PONV Risk Score and Plan:   Airway Management Planned: Oral ETT  Additional Equipment:   Intra-op Plan:   Post-operative Plan: Extubation in OR  Informed Consent: I have reviewed the patients History and Physical, chart, labs and discussed the procedure including the risks, benefits and alternatives for the proposed anesthesia with the patient or authorized representative who has indicated his/her understanding and acceptance.     Dental advisory given  Plan Discussed with: CRNA  Anesthesia Plan Comments:         Anesthesia Quick Evaluation

## 2019-09-27 NOTE — Op Note (Signed)
Operative Note  Preoperative diagnosis:  1. Right renal mass  Postoperative diagnosis: 1. Right renal mass  Procedure(s): 1. RIGHT Hand assisted laparoscopic radical nephrectomy  Surgeon: Jacalyn Lefevre, MD  Assistants: Link Snuffer, MD - An assistant was needed throughout the case with further expertise in assisting with a laparoscopic surgery, including visualization with the camera, passing instruments, etc.  Anesthesia: General  Complications: None  EBL: 100 cc  Specimens: 1. Right Kidney  Drains/Catheters: 1. Foley catheter  Intraoperative findings: Right Kidney removed entirely  Indication: 74 year old who was found on imaging to have a 5cm right renal mass concerning for renal cell carcinoma.  After discussion of different options, the patient elected to undergo the above operation.  Description of procedure:  The patient was identified and consent was obtained.  The patient was taken to the operating room and placed in the supine position.  The patient was placed under general anesthesia.  Perioperative antibiotics were administered.  The patient was placed in right lateral position at approximately 65 degrees and all pressure points were padded.  Patient was prepped and draped in a standard sterile fashion and a timeout was performed.  An 8 cm periumbilical incision was made sharply into the skin.  This was carried down with Bovie electrocautery down to the anterior rectus sheath which was divided with electrocautery.  The underlying musculature was separated in the midline.  Sharp dissection with Metzenbaum scissors was used to open up the posterior sheath and peritoneum.  This was extended with electrocautery taking great care not to use cautery near the bowel.  The hand assist port was secured into the incision.  I made sure no bowel was trapped within this.  A 12 mm port was inserted through the hand assist port and the abdomen was insufflated to a pressure of 15.  A 12  mm port was placed lateral as well as superior to the hand assist port, each 1 about a hand width away. A 5 mm port was placed superior to the midline port and used for liver retraction.  Please note that all ports were placed under direct visualization with the camera.  The colon was first dissected medially by incising along the white line of Toldt.  After medializing the colon, the kidney was dissected laterally and medially as well as superiorly.  Inferior attachments as well as the ureter and gonadal vein were divided with LigaSure device. I continued to carefully dissect medially and identified the renal hilum.  The renal vein and renal artery were divided with a 45 mm vascular staple load.  Superior attachments were then released using LigaSure device as well as blunt dissection.  Once the entire kidney and surrounding Gerota's fascia was freed, the specimen was withdrawn from the midline incision and passed off for permanent specimen.  The abdomen was reinspected and no active bleeding was noted. The midline fascia was closed with running 0 looped PDS suture followed by staples.  The port incisions were closed with a 2-0 Vicryl followed by 4-0 monocryl.  Exparel was instilled for anesthetic effect.  A dressing was applied.  Patient tolerated the procedure well and was stable postoperatively.  Plan:  Anticipate the patient will be in the hospital 1-2 nights as long as he does well.

## 2019-09-27 NOTE — Progress Notes (Signed)
   09/27/19 1745  Provider Notification  Provider Name/Title Felipa Furnace  Date Provider Notified 09/27/19  Time Provider Notified 1745  Notification Type Page  Notification Reason Change in status  Response No new orders  Date of Provider Response 09/27/19  Time of Provider Response [id number]   Patient arrived to unit from PACU and VS obtained at 1720. BP 169/110, pulse 120. Oxygen saturation 99% on 2L. Temp 97.6 axillary.  Patient was in a lot of pain transferring from stretcher to bed. MEWS changed to yellow, level 3. PA on call, Felipa Furnace, paged with patient's MEWS score and VS.  Informed MD that pain medication was going to be given and will monitor BP. Will inform MD if no change in patient's VS after patient settles in bed and pain medication given. Will continue to monitor.

## 2019-09-27 NOTE — Anesthesia Procedure Notes (Signed)
Procedure Name: Intubation Date/Time: 09/27/2019 10:10 AM Performed by: Adalberto Ill, CRNA Pre-anesthesia Checklist: Emergency Drugs available, Patient identified, Suction available, Patient being monitored and Timeout performed Patient Re-evaluated:Patient Re-evaluated prior to induction Oxygen Delivery Method: Circle system utilized Preoxygenation: Pre-oxygenation with 100% oxygen Induction Type: IV induction Ventilation: Mask ventilation without difficulty Laryngoscope Size: Miller and 2 Grade View: Grade I Tube type: Oral Tube size: 7.5 mm Number of attempts: 1 Airway Equipment and Method: Stylet Placement Confirmation: ETT inserted through vocal cords under direct vision,  positive ETCO2 and breath sounds checked- equal and bilateral Secured at: 23 cm Tube secured with: Tape Dental Injury: Teeth and Oropharynx as per pre-operative assessment

## 2019-09-28 ENCOUNTER — Encounter: Payer: Self-pay | Admitting: *Deleted

## 2019-09-28 DIAGNOSIS — G8918 Other acute postprocedural pain: Secondary | ICD-10-CM

## 2019-09-28 DIAGNOSIS — Z515 Encounter for palliative care: Secondary | ICD-10-CM

## 2019-09-28 DIAGNOSIS — G894 Chronic pain syndrome: Secondary | ICD-10-CM

## 2019-09-28 DIAGNOSIS — N2889 Other specified disorders of kidney and ureter: Secondary | ICD-10-CM

## 2019-09-28 LAB — BASIC METABOLIC PANEL
Anion gap: 11 (ref 5–15)
BUN: 8 mg/dL (ref 8–23)
CO2: 28 mmol/L (ref 22–32)
Calcium: 8.8 mg/dL — ABNORMAL LOW (ref 8.9–10.3)
Chloride: 93 mmol/L — ABNORMAL LOW (ref 98–111)
Creatinine, Ser: 1.23 mg/dL (ref 0.61–1.24)
GFR calc Af Amer: >60 mL/min (ref >60)
GFR calc non Af Amer: 59 mL/min — ABNORMAL LOW (ref >60)
Glucose, Bld: 125 mg/dL — ABNORMAL HIGH (ref 70–99)
Potassium: 3.9 mmol/L (ref 3.5–5.1)
Sodium: 132 mmol/L — ABNORMAL LOW (ref 135–145)

## 2019-09-28 LAB — CBC
HCT: 37.3 % — ABNORMAL LOW (ref 39.0–52.0)
Hemoglobin: 12.4 g/dL — ABNORMAL LOW (ref 13.0–17.0)
MCH: 30.4 pg (ref 26.0–34.0)
MCHC: 33.2 g/dL (ref 30.0–36.0)
MCV: 91.4 fL (ref 80.0–100.0)
Platelets: 240 10*3/uL (ref 150–400)
RBC: 4.08 MIL/uL — ABNORMAL LOW (ref 4.22–5.81)
RDW: 12.5 % (ref 11.5–15.5)
WBC: 8.9 10*3/uL (ref 4.0–10.5)
nRBC: 0 % (ref 0.0–0.2)

## 2019-09-28 MED ORDER — HYDROMORPHONE HCL 1 MG/ML IJ SOLN
1.0000 mg | INTRAMUSCULAR | Status: AC
  Administered 2019-09-28: 1 mg via INTRAVENOUS
  Filled 2019-09-28: qty 1

## 2019-09-28 MED ORDER — BELLADONNA ALKALOIDS-OPIUM 16.2-60 MG RE SUPP
1.0000 | Freq: Four times a day (QID) | RECTAL | Status: DC | PRN
  Administered 2019-09-28 (×2): 1 via RECTAL
  Filled 2019-09-28 (×2): qty 1

## 2019-09-28 MED ORDER — ACETAMINOPHEN 325 MG PO TABS
650.0000 mg | ORAL_TABLET | Freq: Four times a day (QID) | ORAL | Status: DC
  Administered 2019-09-28 – 2019-10-01 (×11): 650 mg via ORAL
  Filled 2019-09-28 (×11): qty 2

## 2019-09-28 MED ORDER — HYDROMORPHONE HCL 1 MG/ML IJ SOLN
1.0000 mg | INTRAMUSCULAR | Status: DC | PRN
  Administered 2019-09-28 – 2019-10-01 (×13): 1 mg via INTRAVENOUS
  Filled 2019-09-28 (×13): qty 1

## 2019-09-28 MED ORDER — OXYCODONE HCL 5 MG PO TABS
20.0000 mg | ORAL_TABLET | ORAL | Status: DC | PRN
  Administered 2019-09-28 – 2019-09-30 (×7): 20 mg via ORAL
  Filled 2019-09-28 (×6): qty 4

## 2019-09-28 MED ORDER — ENOXAPARIN SODIUM 40 MG/0.4ML ~~LOC~~ SOLN
40.0000 mg | SUBCUTANEOUS | Status: DC
  Administered 2019-09-28 – 2019-09-29 (×2): 40 mg via SUBCUTANEOUS
  Filled 2019-09-28 (×2): qty 0.4

## 2019-09-28 NOTE — Progress Notes (Signed)
This RN has given scheduled pain medication on top of Q2 hr PRN pain medication for pain control. Pain is still 8/10-10/10 per patient. BP at times is elevated 175/100 due to pain but will decrease back down to baseline at 155/90. MD has been notified and palliative on board for pain management. Will continue to monitor this patient.

## 2019-09-28 NOTE — Anesthesia Postprocedure Evaluation (Signed)
Anesthesia Post Note  Patient: William Jordan  Procedure(s) Performed: HAND ASSISTED LAPAROSCOPIC NEPHRECTOMY (Right )     Patient location during evaluation: PACU Anesthesia Type: General Level of consciousness: awake and alert Pain management: pain level controlled Vital Signs Assessment: post-procedure vital signs reviewed and stable Respiratory status: spontaneous breathing, nonlabored ventilation, respiratory function stable and patient connected to nasal cannula oxygen Cardiovascular status: blood pressure returned to baseline and stable Postop Assessment: no apparent nausea or vomiting Anesthetic complications: no    Last Vitals:  Vitals:   09/28/19 0641 09/28/19 0849  BP:  (!) 150/90  Pulse:  91  Resp:    Temp: (!) 36.3 C   SpO2:      Last Pain:  Vitals:   09/28/19 1126  TempSrc:   PainSc: 8                  Tiajuana Amass

## 2019-09-28 NOTE — Evaluation (Signed)
Physical Therapy Evaluation Patient Details Name: William Jordan MRN: [id number] DOB: [birth year] Today's Date: 09/28/2019   History of Present Illness  Patient is 74 year old man s/p radical nephrectomy for 5 cm right lower pole mass. The mass was 1st seen March 2019 with interval increase in size on CT October 2020. Patient has previously seen Dr. Karsten Ro and they discussed management options including biopsy, cryo/rfa, and surgery.    Clinical Impression  William Jordan is 74 y.o. male admitted with above HPI and diagnosis. Patient is currently limited by functional impairments below (see PT problem list). Patient lives with his wife and is independent at baseline. He was abel to ambulate to bathroom with min assist and RW and successfully voided his bladder. Patient will benefit from continued skilled PT interventions to address impairments and progress independence with mobility, recommending HHPT and rollator as pt uses this primarily at home but his current rollator is in poor condition. Acute PT will follow and progress as able.     Follow Up Recommendations      Equipment Recommendations       Recommendations for Other Services       Precautions / Restrictions Precautions Precautions: Fall Restrictions Weight Bearing Restrictions: No      Mobility  Bed Mobility Overal bed mobility: Needs Assistance Bed Mobility: Supine to Sit;Sit to Supine     Supine to sit: Min assist;HOB elevated Sit to supine: Min assist;HOB elevated   General bed mobility comments: cues for log roll technique to sit up to EOB, assist required for reaching to roll to Rt side and to press up through elbow. pt did not follow cues for log roll to return to supine, assist required to raise LE's up in bed.  Transfers Overall transfer level: Needs assistance Equipment used: Rolling walker (2 wheeled) Transfers: Sit to/from [facility] Sit to Stand: Min assist Stand pivot transfers: Min  assist       General transfer comment: pt required cues for hand placement and technique with RW, assist to perform power up from toilet and EOB. cues for position and stepping in RW to turn for stand step transfer to return from recliner to bed  Ambulation/Gait Ambulation/Gait assistance: Min assist Gait Distance (Feet): 24 Feet(2x12) Assistive device: Rolling walker (2 wheeled)   Gait velocity: decreased   General Gait Details: cues and assist to manage RW to negotiate obstacels in hospital room, no overt LOB.  Stairs            Wheelchair Mobility    Modified Rankin (Stroke Patients Only)       Balance Overall balance assessment: Needs assistance Sitting-balance support: Feet supported Sitting balance-Leahy Scale: Good     Standing balance support: During functional activity;Bilateral upper extremity supported Standing balance-Leahy Scale: Fair                Pertinent Vitals/Pain Pain Assessment: Faces Faces Pain Scale: Hurts whole lot Pain Location: Rt lower quadrant Pain Descriptors / Indicators: Discomfort;Guarding;Grimacing;Moaning Pain Intervention(s): Limited activity within patient's tolerance;Monitored during session;Repositioned    Home Living Family/patient expects to be discharged to:: Private residence Living Arrangements: Spouse/significant other Available Help at Discharge: Family;Available 24 hours/day Type of Home: House Home Access: Ramped entrance     Home Layout: One level Home Equipment: Walker - 4 wheels;Hand held shower head;Grab bars - tub/shower;Shower seat - built in;Grab bars - toilet;Bedside commode;Cane - single point;Walker - 2 wheels Additional Comments: RW and rollator are old and were his father-in-laws  Prior Function Level of Independence: Independent with assistive device(s)         Comments: pt uses rollator for mobility but it is old and he would benefit from assistance. assist with IADLs as needed. Pt  engages in BADLs with set up. Does have hx of multiple falls.     Hand Dominance   Dominant Hand: Right    Extremity/Trunk Assessment   Upper Extremity Assessment Upper Extremity Assessment: Overall WFL for tasks assessed    Lower Extremity Assessment Lower Extremity Assessment: Overall WFL for tasks assessed    Cervical / Trunk Assessment Cervical / Trunk Assessment: Normal  Communication   Communication: No difficulties  Cognition Arousal/Alertness: Awake/alert Behavior During Therapy: WFL for tasks assessed/performed Overall Cognitive Status: Within Functional Limits for tasks assessed                 General Comments      Exercises     Assessment/Plan    PT Assessment Patient needs continued PT services  PT Problem List Decreased mobility;Decreased strength;Decreased balance;Decreased activity tolerance;Pain       PT Treatment Interventions DME instruction;Functional mobility training;Balance training;Patient/family education;Therapeutic activities;Stair training;Therapeutic exercise;Gait training    PT Goals (Current goals can be found in the Care Plan section)  Acute Rehab PT Goals Patient Stated Goal: have less pain PT Goal Formulation: With patient/family Time For Goal Achievement: 10/12/19 Potential to Achieve Goals: Good    Frequency Min 3X/week    AM-PAC PT "6 Clicks" Mobility  Outcome Measure Help needed turning from your back to your side while in a flat bed without using bedrails?: A Little Help needed moving from lying on your back to sitting on the side of a flat bed without using bedrails?: A Little Help needed moving to and from a bed to a chair (including a wheelchair)?: A Little Help needed standing up from a chair using your arms (e.g., wheelchair or bedside chair)?: A Little Help needed to walk in hospital room?: A Little Help needed climbing 3-5 steps with a railing? : A Little 6 Click Score: 18    End of Session Equipment  Utilized During Treatment: Gait belt Activity Tolerance: Patient limited by pain Patient left: in bed;with call bell/phone within reach;with family/visitor present Nurse Communication: Mobility status PT Visit Diagnosis: Muscle weakness (generalized) (M62.81);Unsteadiness on feet (R26.81);Difficulty in walking, not elsewhere classified (R26.2);Pain Pain - Right/Left: Right Pain - part of body: (lower abdomen)    Time: [id number] PT Time Calculation (min) (ACUTE ONLY): 37 min   Charges:   PT Evaluation $PT Eval Low Complexity: 1 Low PT Treatments $Therapeutic Activity: 8-22 mins       Verner Mould, DPT Physical Therapist with [facility] [phone]  09/28/2019 1:47 PM

## 2019-09-28 NOTE — Progress Notes (Addendum)
rm [place], 73, Nephroectomy d/t renal mass . BP 180/100 HR 89.  No prn antihypertensive meds. BP has  been high d/t pain?  Rechecked BP and  It dropped to 149/97 HR 100.  Will continue to monitor.

## 2019-09-28 NOTE — Progress Notes (Signed)
Urology Inpatient Progress Report  Right renal mass [N28.89]  Procedure(s): HAND ASSISTED LAPAROSCOPIC NEPHRECTOMY  1 Day Post-Op   Intv/Subj: Patient with poor pain control overnight.  No flatus and no ambulation.  Tolerating clears. Denies chills, nausea, bleching.    Active Problems:   Right renal mass  Current Facility-Administered Medications  Medication Dose Route Frequency Provider Last Rate Last Admin  . acetaminophen (TYLENOL) tablet 650 mg  650 mg Oral Q4H PRN Jacalyn Lefevre D, MD      . amLODipine (NORVASC) tablet 10 mg  10 mg Oral Daily Jacalyn Lefevre D, MD   10 mg at 09/28/19 0844  . atorvastatin (LIPITOR) tablet 40 mg  40 mg Oral q1800 Jacalyn Lefevre D, MD   40 mg at 09/27/19 1814  . Chlorhexidine Gluconate Cloth 2 % PADS 6 each  6 each Topical Daily Robley Fries, MD   6 each at 09/28/19 0021  . colchicine tablet 0.6 mg  0.6 mg Oral BID Jacalyn Lefevre D, MD   0.6 mg at 09/28/19 0844  . dextrose 5 %-0.45 % sodium chloride infusion   Intravenous Continuous Jacalyn Lefevre D, MD 100 mL/hr at 09/28/19 0152 New Bag at 09/28/19 0152  . divalproex (DEPAKOTE) DR tablet 500 mg  500 mg Oral Q12H Jacalyn Lefevre D, MD   500 mg at 09/28/19 0844  . docusate sodium (COLACE) capsule 100 mg  100 mg Oral BID Jacalyn Lefevre D, MD   100 mg at 09/28/19 0844  . enoxaparin (LOVENOX) injection 40 mg  40 mg Subcutaneous Q24H Kala Ambriz, Simone Curia D, MD      . HYDROmorphone (DILAUDID) injection 0.5-1 mg  0.5-1 mg Intravenous Q2H PRN Jacalyn Lefevre D, MD   1 mg at 09/28/19 0846  . loratadine (CLARITIN) tablet 10 mg  10 mg Oral Daily Jacalyn Lefevre D, MD   10 mg at 09/28/19 0843  . magnesium gluconate (MAGONATE) tablet 500 mg  500 mg Oral BID Jacalyn Lefevre D, MD   500 mg at 09/28/19 0845  . ondansetron (ZOFRAN) injection 4 mg  4 mg Intravenous Q4H PRN Jacalyn Lefevre D, MD      . oxyCODONE (Oxy IR/ROXICODONE) immediate release tablet 20 mg  20 mg Oral Q8H PRN Jacalyn Lefevre D, MD   20 mg  at 09/28/19 0643  . oxyCODONE (Oxy IR/ROXICODONE) immediate release tablet 5 mg  5 mg Oral Q4H PRN Jacalyn Lefevre D, MD      . pantoprazole (PROTONIX) EC tablet 40 mg  40 mg Oral Daily Jacalyn Lefevre D, MD   40 mg at 09/28/19 0844  . senna (SENOKOT) tablet 8.6 mg  1 tablet Oral BID Jacalyn Lefevre D, MD   8.6 mg at 09/28/19 0843  . traZODone (DESYREL) tablet 50 mg  50 mg Oral QHS PRN Robley Fries, MD       Facility-Administered Medications Ordered in Other Encounters  Medication Dose Route Frequency Provider Last Rate Last Admin  . fentaNYL (SUBLIMAZE) injection    PRN Martinique, Peter M, MD   25 mcg at 05/03/18 1336  . Heparin (Porcine) in NaCl 1000-0.9 UT/500ML-% SOLN    PRN [place]ique, Peter M, MD   500 mL at 05/03/18 1331  . Heparin (Porcine) in NaCl 1000-0.9 UT/500ML-% SOLN    PRN [place], Peter M, MD   500 mL at 05/03/18 1332  . heparin injection    PRN [place], Peter M, MD   4,000 Units at 05/03/18 1345  . iopamidol (ISOVUE-370) 76 % injection  PRN [place], Peter M, MD   85 mL at 05/03/18 1357  . lidocaine (PF) (XYLOCAINE) 1 % injection    PRN [place]ique, Peter M, MD   2 mL at 05/03/18 1342  . midazolam (VERSED) injection    PRN [place]ique, Peter M, MD   1 mg at 05/03/18 1336  . Radial Cocktail/Verapamil only    PRN Martinique, Peter M, MD   Given at 05/03/18 1344     Objective: Vital: Vitals:   09/28/19 0141 09/28/19 0540 09/28/19 0641 09/28/19 0849  BP: (!) 143/96 (!) 154/91  (!) 150/90  Pulse: (!) 108 97  91  Resp: 18 16    Temp: 98.7 F (37.1 C)  (!) 97.4 F (36.3 C)   TempSrc: Oral     SpO2: 96% 94%    Weight:      Height:       I/Os: I/O last 3 completed shifts: In: 3499.9 [P.O.:200; I.V.:2599.9; IV Piggyback:700] Out: 4300 [Urine:4300]  Physical Exam:  General: Patient is in no apparent distress Lungs: Normal respiratory effort, chest expands symmetrically. GI: Incisions are c/d/i and midline incision dressing with old blood. The abdomen is soft and appropriately tender to  palpation. Foley: clear yellow urine Ext: lower extremities symmetric with SCDs on and functioning  Lab Results: Recent Labs    09/28/19 0525  WBC 8.9  HGB 12.4*  HCT 37.3*   Recent Labs    09/28/19 0525  NA 132*  K 3.9  CL 93*  CO2 28  GLUCOSE 125*  BUN 8  CREATININE 1.23  CALCIUM 8.8*   Recent Labs    09/27/19 0845  INR 1.0   No results for input(s): LABURIN in the last 72 hours. Results for orders placed or performed during the hospital encounter of 09/23/19  Novel Coronavirus, NAA (Hosp order, Send-out to Ref Lab; TAT 18-24 hrs     Status: None   Collection Time: 09/23/19  8:53 AM   Specimen: Nasopharyngeal Swab; Respiratory  Result Value Ref Range Status   SARS-CoV-2, NAA NOT DETECTED NOT DETECTED Final    Comment: (NOTE) Testing was performed using the cobas(R) SARS-CoV-2 test. This nucleic acid amplification test was developed and its performance characteristics determined by [employer]. Nucleic acid amplification tests include PCR and TMA. This test has not been FDA cleared or approved. This test has been authorized by FDA under an Emergency Use Authorization (EUA). This test is only authorized for the duration of time the declaration that circumstances exist justifying the authorization of the emergency use of in vitro diagnostic tests for detection of SARS-CoV-2 virus and/or diagnosis of COVID-19 infection under section 564(b)(1) of the Act, 21 U.S.C. [id number]) (1), unless the authorization is terminated or revoked sooner. When diagnostic testing is negative, the possibility of a false negative result should be considered in the context of a patient's recent exposures and the presence of clinical signs and symptoms consistent with COVID-19. An individual without s ymptoms of COVID- 19 and who is not shedding SARS-CoV-2 virus would expect to have a negative (not detected) result in this assay. Performed At: [facility] [address] [place], [place] [id number] Rush Farmer MD [id number]    [place]  Final    Comment: Performed at [facility], [place] [address], [place], [place] [address]    Studies/Results: DG Abd 1 View  Result Date: 09/27/2019 CLINICAL DATA:  Laparotomy.  Incomplete instrument count EXAM: ABDOMEN - 1 VIEW COMPARISON:  10/06/2015 FINDINGS: NG  in the proximal stomach. Nonobstructive bowel gas pattern. Probable pneumoperitoneum related to surgery. Skin staples in the umbilical region. No retained surgical instrument or needle identified Spinal cord stimulator. Generator in the right lower quadrant unchanged from the prior study. Right hip replacement. IMPRESSION: Postop laparotomy.  No retained instrument. Electronically Signed   By: Franchot Gallo M.D.   On: 09/27/2019 12:50    Assessment: 74 yo man with right renal mass POD1 s/p HAND ASSISTED LAPAROSCOPIC NEPHRECTOMY.  Patient kidney function and hemoglobin stable.  Main issue is pain control and return of bowel function.  Plan: -continue clears until flatus -SCDs at all time while in bed and start lovenox today for DVT ppx, plan to resume pt anticoagulation tomorrow -home meds -resume patient home pain med regimen, would like assistance managing patient acute postop pain (pt sees Dr. Hardin Negus for pain management) -OOB today (PT consult placed for assistance) -AM BMP to track renal function  LOS expected greater than 2 nights  Jacalyn Lefevre, MD Urology 09/28/2019, 9:10 AM

## 2019-09-28 NOTE — Consult Note (Signed)
Palliative care consult note  Reason for consult: Postoperative pain following nephrectomy for renal mass  Chart reviewed including personal review of pertinent labs and imaging.  Discussed with bedside RN.  Briefly, William Jordan is a 74 year old male with past medical history of anxiety, arthritis, chronic back pain, CVA, MI, hard of hearing, hypertension, neuropathy, spinal cord stimulator (turned off per past medical history) who was admitted with renal mass.  He is now status post nephrectomy for 5 cm right lower pole mass by Dr. Claudia Desanctis on 1/12.  Mass first noted in March 2019 with interval increase in size on CT in October 2020.  He reports pain is not controlled despite current pain regimen.  Palliative consulted for assistance with pain control recommendations.  He reports that his pain is currently 9 out of 10, sharp in nature, located near incision site and radiates through to his back.  It improves slightly with pain medications but never gets below 8 out of 10.  It worsens with certain movements.  Pain was increasing prior to surgery but has become uncontrolled since having surgical intervention yesterday.  Accompanying factors include increased belching but no flatus or bowel movement.  We discussed his chronic pain that is related to neuropathy.  He states this pain is certainly different than his chronic pain.  He currently follows with Dr. Nicholaus Bloom.  William Jordan reports he tries not to take "too much" pain medication at home and originally estimated that he takes approximately three 20 mg tablets of oxycodone per day.  I reviewed his PDMP where he had been receiving 180 tablets every 30 days up through November of this year at which point in time it was decreased to 150 tablets every 30 days.  I asked him about the amount of pills that he takes and he reports that he does not have extra at the end of the month.  When asked about the difference between his report of taking around 3/day (which  will be 90 pills/month) in relation to his prescribed amount of 5/day (150 pills/month), he states that he tries to limit himself to 3 tablets/day but he does not have extra pills at the end of the month and must actually be taking it 5 times per day.    It appears that William Jordan home regimen is 5 tablets of 20 mg of oral oxycodone daily.  This would be the oral morphine equivalent of 150 mg of oral morphine. Over the last 24 hours, he has had 2 doses of oxycodone 20 mg as well as 7 doses of 1 mg of IV Dilaudid.  This would be roughly the equivalent of 200 mg of oral morphine.  In acute postoperative phase, it is not surprising that he is requiring higher dose of medication to control his pain.  I agree with plan to work to get him back to his home regimen for discharge, but believe we will need to be more aggressive at least until we get his pain under better control overall.  -Scheduled Tylenol 650 mg 4 times daily -Increase oxycodone frequency to 20 mg every 4 hours as needed for severe pain. -I also left breakthrough dose of IV Dilaudid to be as a second line pain medication to be used in the event that he does not receive her sufficient relief from oral pain medication. -We will need to monitor closely due to concerns for altered mental status related to pain medication (can also develop delirium from uncontrolled pain and so I  believe it would be in his best interest to aggressively to pain management) and also the fact that increased dose of pain medication could contribute to postop ileus.  At the same time, he needs to be up and mobile and cannot do that at this time secondary to severe pain. -Hopefully pain begins to resolve quickly as healing begins and ileus improves.  If not, we will plan on reaching out to Dr. Hardin Negus (outpatient pain management physician) to discuss further if it appears he cannot get back to his home regimen with adequate pain control for discharge.  Total time: 80  minutes Greater than 50%  of this time was spent counseling and coordinating care related to the above assessment and plan.  William Rough, MD [place] Team [phone]

## 2019-09-29 ENCOUNTER — Other Ambulatory Visit: Payer: Self-pay

## 2019-09-29 LAB — CBC WITH DIFFERENTIAL/PLATELET
Abs Immature Granulocytes: 0.04 10*3/uL (ref 0.00–0.07)
Basophils Absolute: 0 10*3/uL (ref 0.0–0.1)
Basophils Relative: 0 %
Eosinophils Absolute: 0.2 10*3/uL (ref 0.0–0.5)
Eosinophils Relative: 2 %
HCT: 37.7 % — ABNORMAL LOW (ref 39.0–52.0)
Hemoglobin: 12.6 g/dL — ABNORMAL LOW (ref 13.0–17.0)
Immature Granulocytes: 0 %
Lymphocytes Relative: 12 %
Lymphs Abs: 1.1 10*3/uL (ref 0.7–4.0)
MCH: 30.9 pg (ref 26.0–34.0)
MCHC: 33.4 g/dL (ref 30.0–36.0)
MCV: 92.4 fL (ref 80.0–100.0)
Monocytes Absolute: 1.4 10*3/uL — ABNORMAL HIGH (ref 0.1–1.0)
Monocytes Relative: 15 %
Neutro Abs: 6.3 10*3/uL (ref 1.7–7.7)
Neutrophils Relative %: 71 %
Platelets: 218 10*3/uL (ref 150–400)
RBC: 4.08 MIL/uL — ABNORMAL LOW (ref 4.22–5.81)
RDW: 12.6 % (ref 11.5–15.5)
WBC: 9 10*3/uL (ref 4.0–10.5)
nRBC: 0 % (ref 0.0–0.2)

## 2019-09-29 LAB — BASIC METABOLIC PANEL
Anion gap: 9 (ref 5–15)
BUN: 8 mg/dL (ref 8–23)
CO2: 31 mmol/L (ref 22–32)
Calcium: 8.9 mg/dL (ref 8.9–10.3)
Chloride: 93 mmol/L — ABNORMAL LOW (ref 98–111)
Creatinine, Ser: 1.32 mg/dL — ABNORMAL HIGH (ref 0.61–1.24)
GFR calc Af Amer: >60 mL/min (ref >60)
GFR calc non Af Amer: 54 mL/min — ABNORMAL LOW (ref >60)
Glucose, Bld: 116 mg/dL — ABNORMAL HIGH (ref 70–99)
Potassium: 3.8 mmol/L (ref 3.5–5.1)
Sodium: 133 mmol/L — ABNORMAL LOW (ref 135–145)

## 2019-09-29 LAB — SURGICAL PATHOLOGY

## 2019-09-29 MED ORDER — CLOPIDOGREL BISULFATE 75 MG PO TABS
75.0000 mg | ORAL_TABLET | Freq: Every day | ORAL | Status: DC
  Administered 2019-09-30 – 2019-10-01 (×2): 75 mg via ORAL
  Filled 2019-09-29 (×2): qty 1

## 2019-09-29 MED ORDER — ASPIRIN 325 MG PO TABS
325.0000 mg | ORAL_TABLET | Freq: Every day | ORAL | Status: DC
  Administered 2019-09-30 – 2019-10-01 (×2): 325 mg via ORAL
  Filled 2019-09-29 (×2): qty 1

## 2019-09-29 NOTE — Progress Notes (Signed)
Daily Progress Note   Patient Name: William Jordan       Date: 09/29/2019 DOB: 07-09-1946  Age: 74 y.o. MRN#: [id number] Attending Physician: Robley Fries, MD Primary Care Physician: Townsend Roger, MD Admit Date: 09/27/2019  Reason for Consultation/Follow-up: Pain control  Subjective: I saw and examined [name] today.  He reports that his pain is better than it was yesterday, but still is not controlled as well as he would prefer.  Pain currently 8/10 and remains sharp in near incision through back of abdomen.    His wife is present at the bedside and thinks that he looks much better today.  He is certainly more interactive and conversational with me.  MAR review reveals that he has had 8 doses of 1mg  of IV dilaudid as well as 3 doses of 20mg  of oral oxycodone.  Total oral morphine equivalent of approximately 250mg  over the last 24 hours.   Discussed plan to continue with current therapies and reevaluate tomorrow.  I discussed with Dr. Claudia Desanctis and also reached out and left message for Dr. Hardin Negus.  Length of Stay: 2  Current Medications: Scheduled Meds:  . acetaminophen  650 mg Oral Q6H  . amLODipine  10 mg Oral Daily  . atorvastatin  40 mg Oral q1800  . Chlorhexidine Gluconate Cloth  6 each Topical Daily  . colchicine  0.6 mg Oral BID  . divalproex  500 mg Oral Q12H  . docusate sodium  100 mg Oral BID  . enoxaparin (LOVENOX) injection  40 mg Subcutaneous Q24H  . loratadine  10 mg Oral Daily  . magnesium gluconate  500 mg Oral BID  . pantoprazole  40 mg Oral Daily  . senna  1 tablet Oral BID    Continuous Infusions: . dextrose 5 % and 0.45% NaCl 100 mL/hr at 09/29/19 1032    PRN Meds: HYDROmorphone (DILAUDID) injection, ondansetron, opium-belladonna, oxyCODONE,  traZODone  Physical Exam      General: Alert, awake, in no acute distress.  HEENT: No bruits, no goiter, no JVD Heart: Regular rate and rhythm. No murmur appreciated. Lungs: Good air movement, clear Ext: R hand swollen Skin: Warm and dry Neuro: Grossly intact, nonfocal.      Vital Signs: BP (!) 174/96 (BP Location: Left Arm)   Pulse 98   Temp  98 F (36.7 C)   Resp 18   Ht 5\' 9"  (1.753 m)   Wt 83.2 kg   SpO2 100%   BMI 27.09 kg/m  SpO2: SpO2: 100 % O2 Device: O2 Device: Room Air O2 Flow Rate: O2 Flow Rate (L/min): 2 L/min  Intake/output summary:   Intake/Output Summary (Last 24 hours) at 09/29/2019 1159 Last data filed at 09/29/2019 0448 Gross per 24 hour  Intake 2251.63 ml  Output 850 ml  Net 1401.63 ml   LBM: Last BM Date: (PTA) Baseline Weight: Weight: 79.8 kg Most recent weight: Weight: 83.2 kg       Palliative Assessment/Data:    Flowsheet Rows     Most Recent Value  Intake Tab  Referral Department  Hospitalist  Unit at Time of Referral  Med/Surg Unit  Palliative Care Primary Diagnosis  Pain  Date Notified  09/28/19  Palliative Care Type  New Palliative care  Reason for referral  Pain  Date of Admission  09/27/19  Date first seen by Palliative Care  09/28/19  # of days Palliative referral response time  0 Day(s)  # of days IP prior to Palliative referral  1  Clinical Assessment  Palliative Performance Scale Score  80%  Psychosocial & Spiritual Assessment  Palliative Care Outcomes  Patient/Family meeting held?  No      Patient Active Problem List   Diagnosis Date Noted  . Right renal mass 09/27/2019  . Pre-operative clearance 09/15/2019  . History of non-ST elevation myocardial infarction (NSTEMI) 09/15/2019  . Paroxysmal atrial fibrillation ([place]) 09/01/2019  . Acquired thrombophilia ([place]) 09/01/2019  . Traumatic retroperitoneal hematoma 06/28/2019  . Mild cognitive impairment   . Acute encephalopathy 11/06/2018  . Left leg swelling  05/16/2018  . Alcohol abuse 05/16/2018  . Depression 05/16/2018  . Toxic encephalopathy   . Marijuana abuse   . Encephalopathy 05/15/2018  . Gait disturbance, post-stroke   . History of CVA (cerebrovascular accident) 05/04/2018  . Spastic hemiparesis of left nondominant side due to acute cerebral infarction ([place])   . 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[place])   . Essential hypertension   . Hyponatremia   . Non-ST elevation (NSTEMI) myocardial infarction ([place])   . Ventricular tachycardia ([place])   . CVA (cerebral vascular accident) ([place]) 04/25/2018  . Angioedema 04/18/2018  . History of right inguinal hernia repair 02/10/2018  . Acute kidney injury ([place]) 12/10/2017  . Urinary retention 04/25/2016  . Acute renal failure syndrome ([place])   . Low back pain with sciatica 04/03/2015  . H/O total hip arthroplasty 04/03/2015  . Idiopathic peripheral neuropathy 09/29/2014  . Chronic pain 09/28/2014  . DDD (degenerative disc disease), lumbar 12/30/2012  . Degenerative arthritis of thoracic spine 12/30/2012  . Localized osteoarthrosis 12/13/2012  . Chronic pain associated with significant psychosocial dysfunction 12/13/2012  . Polypharmacy 12/13/2012  . Long term current use of opiate analgesic 12/13/2012    Palliative Care Assessment & Plan   Patient Profile: 74 year old male with past medical history of anxiety, arthritis, chronic back pain, CVA, MI, hard of hearing, hypertension, neuropathy, spinal cord stimulator (turned off per past medical history) who was admitted with renal mass.  He is now status post nephrectomy for 5 cm right lower pole mass by Dr. Claudia Desanctis on 1/12.  Mass first noted in March 2019 with interval increase in size on CT in October 2020.  He reports pain is not controlled despite current pain regimen.  Palliative consulted for assistance with pain control recommendations.  Recommendations/Plan:  Pain:  Better control today.  Plan to continue current regimen for now but will need to begin  planning transition to strictly oral medications as we get closer to discharge.  While it would be ideal to get back to a place where he is on his home outpatient regimen, I do not think that is likely will be able to reduce his opioid needs back to this point for he discharges.  Therefore, I would consider a short-term increase in his outpatient dosing regimen (appears to be oxycodone 20 mg approximately 5 times per day) by 50% to oxycodone 30 mg every 4 hours as needed for pain.  Oxycodone 30 mg with 6 doses daily would be an oral morphine equivalent of 270 mg of oral morphine which would be roughly the same as his most recent 24-hour need which had an oral morphine equivalent of 250 mg.  I discussed this with Dr. Claudia Desanctis today, and I also called and left a message for Dr. Hardin Negus to discuss further as he is Mr. Mckiernan outpatient pain management physician.  Code Status:    Code Status Orders  (From admission, onward)         Start     Ordered   09/27/19 1723  Full code  Continuous     09/27/19 1722        Code Status History    Date Active Date Inactive Code Status Order ID Comments User Context   06/28/2019 2302 07/01/2019 2116 Full Code [id number]  Clovis Riley, MD ED   11/06/2018 0819 11/07/2018 1811 Full Code [id number]  Neva Seat, MD ED   05/15/2018 2139 05/17/2018 1715 Full Code [id number]  Katherine Roan, MD ED   05/04/2018 1750 05/07/2018 1300 Full Code [id number]  Cathlyn Parsons, PA-C Inpatient   05/04/2018 1750 05/04/2018 1750 Full Code [id number]  Cathlyn Parsons, PA-C Inpatient   05/03/2018 2010 05/04/2018 1749 Full Code [id number]  Tommie Raymond, NP Inpatient   04/28/2018 1811 05/03/2018 1524 Full Code [id number]  Cathlyn Parsons, PA-C Inpatient   04/28/2018 1811 04/28/2018 1811 Full Code [id number]  Cathlyn Parsons, PA-C Inpatient   04/25/2018 1752 04/28/2018 1803 Full Code [id number]  Ledell Noss, MD Inpatient   12/10/2017 1954 12/12/2017 1456 Full Code [id number]  [name], MD ED   09/26/2015 1204 10/02/2015 2006 Full Code [id number]  Minor, Grace Bushy, NP ED   08/14/2015 2316 08/17/2015 1825 Full Code [id number]  Allyne Gee, MD Inpatient   06/16/2015 0319 06/18/2015 2156 Full Code [id number]  Theressa Millard, MD Inpatient   06/05/2015 2017 06/12/2015 1836 Full Code [id number]  Kelvin Cellar, MD ED   09/28/2014 1123 09/29/2014 1450 Full Code [id number]  Melina Schools, MD Inpatient   Advance Care Planning Activity      Care plan was discussed with patient, RN, Dr. Claudia Desanctis, patient's wife  Thank you for allowing the Palliative Medicine Team to assist in the care of this patient.   Time In: 1100 Time Out: 1145 Total Time 45 Prolonged Time Billed No      Greater than 50%  of this time was spent counseling and coordinating care related to the above assessment and plan.  Micheline Rough, MD  Please contact Palliative Medicine Team phone at [phone] for questions and concerns.

## 2019-09-29 NOTE — Plan of Care (Signed)

## 2019-09-29 NOTE — Progress Notes (Signed)
Physical Therapy Treatment Patient Details Name: William Jordan MRN: [id number] DOB: Apr 19, 1946 Today's Date: 09/29/2019    History of Present Illness Patient is 73 year old man s/p radical nephrectomy for 5 cm right lower pole mass. The mass was 1st seen March 2019 with interval increase in size on CT October 2020. Patient has previously seen Dr. Karsten Ro and they discussed management options including biopsy, cryo/rfa, and surgery.    PT Comments    Pt with decreased mobility today due to pain.  RN notified.     Follow Up Recommendations  Home health PT     Equipment Recommendations  Other (comment)(4 WW/ rollator)    Recommendations for Other Services       Precautions / Restrictions Precautions Precautions: Fall    Mobility  Bed Mobility Overal bed mobility: Needs Assistance Bed Mobility: Supine to Sit;Sit to Supine     Supine to sit: Mod assist;+2 for physical assistance Sit to supine: +2 for physical assistance;Max assist   General bed mobility comments: pt assisting with feet over EOB however required assist for trunk upright due to pain, max assist +2 required to return to bed due to pain  Transfers Overall transfer level: Needs assistance Equipment used: Rolling walker (2 wheeled) Transfers: Sit to/from Stand Sit to Stand: Min assist         General transfer comment: verbal cues for hand placement, assist to rise and steady, pt able to take a few steps up [facility] however fatigued quickly (nurse tech also changed linen as urinary device leaking)  Ambulation/Gait             General Gait Details: pt did not feel able due to pain   Stairs             Wheelchair Mobility    Modified Rankin (Stroke Patients Only)       Balance                                            Cognition Arousal/Alertness: Awake/alert Behavior During Therapy: WFL for tasks assessed/performed Overall Cognitive Status: Within Functional Limits for  tasks assessed                                        Exercises      General Comments        Pertinent Vitals/Pain Pain Assessment: 0-10 Pain Score: 8  Pain Location: Rt lower quadrant Pain Descriptors / Indicators: Discomfort;Guarding;Grimacing;Moaning Pain Intervention(s): Repositioned;Patient requesting pain meds-RN notified;Monitored during session    Home Living                      Prior Function            PT Goals (current goals can now be found in the care plan section) Progress towards PT goals: Progressing toward goals    Frequency    Min 3X/week      PT Plan Current plan remains appropriate    Co-evaluation              AM-PAC PT "6 Clicks" Mobility   Outcome Measure  Help needed turning from your back to your side while in a flat bed without using bedrails?: A Little Help needed moving from lying on your back to  sitting on the side of a flat bed without using bedrails?: A Little Help needed moving to and from a bed to a chair (including a wheelchair)?: A Little Help needed standing up from a chair using your arms (Jordan.g., wheelchair or bedside chair)?: A Little Help needed to walk in hospital room?: A Lot Help needed climbing 3-5 steps with a railing? : Total 6 Click Score: 15    End of Session Equipment Utilized During Treatment: Gait belt Activity Tolerance: Patient limited by pain Patient left: with call bell/phone within reach;in bed;with nursing/sitter in room;with bed alarm set   PT Visit Diagnosis: Muscle weakness (generalized) (M62.81);Unsteadiness on feet (R26.81);Difficulty in walking, not elsewhere classified (R26.2)     Time: [id number] PT Time Calculation (min) (ACUTE ONLY): 23 min  Charges:  $Therapeutic Activity: 8-22 mins           Arlyce Dice, DPT Acute Rehabilitation Services Office: [phone]   William Jordan,William Jordan 09/29/2019, 4:00 PM

## 2019-09-29 NOTE — Progress Notes (Addendum)
Urology Inpatient Progress Report  Right renal mass [N28.89]  Procedure(s): HAND ASSISTED LAPAROSCOPIC NEPHRECTOMY  2 Days Post-Op   Intv/Subj: Yesterday pain was not adequately controlled and palliative medicine was consulted for assistance.  Have increased patient's scheduled pain medication.  He was able to ambulate and OOB yesterday.  Foley was removed.  Patient tolerating clears but has not yet had flatus.  He denies nausea, burping, fever or chills.    Active Problems:   Right renal mass  Current Facility-Administered Medications  Medication Dose Route Frequency Provider Last Rate Last Admin  . acetaminophen (TYLENOL) tablet 650 mg  650 mg Oral Q6H Micheline Rough, MD   650 mg at 09/28/19 1952  . amLODipine (NORVASC) tablet 10 mg  10 mg Oral Daily Jacalyn Lefevre D, MD   10 mg at 09/28/19 0844  . atorvastatin (LIPITOR) tablet 40 mg  40 mg Oral q1800 Jacalyn Lefevre D, MD   40 mg at 09/28/19 1823  . Chlorhexidine Gluconate Cloth 2 % PADS 6 each  6 each Topical Daily Robley Fries, MD   6 each at 09/28/19 [phone]  . colchicine tablet 0.6 mg  0.6 mg Oral BID Jacalyn Lefevre D, MD   0.6 mg at 09/28/19 2129  . dextrose 5 %-0.45 % sodium chloride infusion   Intravenous Continuous Jacalyn Lefevre D, MD 100 mL/hr at 09/28/19 2353 New Bag at 09/28/19 2353  . divalproex (DEPAKOTE) DR tablet 500 mg  500 mg Oral Q12H Jacalyn Lefevre D, MD   500 mg at 09/28/19 2129  . docusate sodium (COLACE) capsule 100 mg  100 mg Oral BID Jacalyn Lefevre D, MD   100 mg at 09/28/19 2129  . enoxaparin (LOVENOX) injection 40 mg  40 mg Subcutaneous Q24H Jacalyn Lefevre D, MD   40 mg at 09/28/19 1056  . HYDROmorphone (DILAUDID) injection 1 mg  1 mg Intravenous Q2H PRN Micheline Rough, MD   1 mg at 09/28/19 2350  . loratadine (CLARITIN) tablet 10 mg  10 mg Oral Daily Jacalyn Lefevre D, MD   10 mg at 09/28/19 0843  . magnesium gluconate (MAGONATE) tablet 500 mg  500 mg Oral BID Jacalyn Lefevre D, MD   500 mg at  09/28/19 2130  . ondansetron (ZOFRAN) injection 4 mg  4 mg Intravenous Q4H PRN Jacalyn Lefevre D, MD      . opium-belladonna (B&O SUPPRETTES) 16.2-60 MG suppository 1 suppository  1 suppository Rectal Q6H PRN Robley Fries, MD   1 suppository at 09/28/19 1822  . oxyCODONE (Oxy IR/ROXICODONE) immediate release tablet 20 mg  20 mg Oral Q4H PRN Micheline Rough, MD   20 mg at 09/28/19 1952  . pantoprazole (PROTONIX) EC tablet 40 mg  40 mg Oral Daily Jacalyn Lefevre D, MD   40 mg at 09/28/19 0844  . senna (SENOKOT) tablet 8.6 mg  1 tablet Oral BID Jacalyn Lefevre D, MD   8.6 mg at 09/28/19 2129  . traZODone (DESYREL) tablet 50 mg  50 mg Oral QHS PRN Jacalyn Lefevre D, MD   50 mg at 09/28/19 2129   Facility-Administered Medications Ordered in Other Encounters  Medication Dose Route Frequency Provider Last Rate Last Admin  . fentaNYL (SUBLIMAZE) injection    PRN Martinique, Peter M, MD   25 mcg at 05/03/18 1336  . Heparin (Porcine) in NaCl 1000-0.9 UT/500ML-% SOLN    PRN [place], Peter M, MD   500 mL at 05/03/18 1331  . Heparin (Porcine) in NaCl 1000-0.9 UT/500ML-% SOLN  PRN [place], Peter M, MD   500 mL at 05/03/18 1332  . heparin injection    PRN [place], Peter M, MD   4,000 Units at 05/03/18 1345  . iopamidol (ISOVUE-370) 76 % injection    PRN [place], Peter M, MD   85 mL at 05/03/18 1357  . lidocaine (PF) (XYLOCAINE) 1 % injection    PRN [place], Peter M, MD   2 mL at 05/03/18 1342  . midazolam (VERSED) injection    PRN [place], Peter M, MD   1 mg at 05/03/18 1336  . Radial Cocktail/Verapamil only    PRN [place], Peter M, MD   Given at 05/03/18 1344     Objective: Vital: Vitals:   09/28/19 1916 09/28/19 2006 09/28/19 2213 09/29/19 0704  BP: (!) 161/100 (!) 182/100 (!) 149/97 (!) 174/96  Pulse: 100 89 100 98  Resp:  18 18 18   Temp:  98.9 F (37.2 C)  98 F (36.7 C)  TempSrc:      SpO2:  99%  (!) 81%  Weight:      Height:       I/Os: I/O last 3 completed shifts: In: 3801.4 [P.O.:480;  I.V.:3221.4; IV Piggyback:100] Out: 3200 [Urine:2950; Stool:250]  Physical Exam:  General: Patient is in no apparent distress, more conversant this AM Lungs: Normal respiratory effort, chest expands symmetrically. GI: Incisions are c/d/i and midline dressing removed.  Midline incision c/d/i with mild ecchymosis.  The abdomen is soft and appropriately tender. JP drain with serosanguinous drainage Foley: removed Ext: lower extremities symmetric, SCDs on  Lab Results: Recent Labs    09/28/19 0525  WBC 8.9  HGB 12.4*  HCT 37.3*   Recent Labs    09/28/19 0525  NA 132*  K 3.9  CL 93*  CO2 28  GLUCOSE 125*  BUN 8  CREATININE 1.23  CALCIUM 8.8*   Recent Labs    09/27/19 0845  INR 1.0   No results for input(s): LABURIN in the last 72 hours.   Studies/Results: DG Abd 1 View  Result Date: 09/27/2019 CLINICAL DATA:  Laparotomy.  Incomplete instrument count EXAM: ABDOMEN - 1 VIEW COMPARISON:  10/06/2015 FINDINGS: NG in the proximal stomach. Nonobstructive bowel gas pattern. Probable pneumoperitoneum related to surgery. Skin staples in the umbilical region. No retained surgical instrument or needle identified Spinal cord stimulator. Generator in the right lower quadrant unchanged from the prior study. Right hip replacement. IMPRESSION: Postop laparotomy.  No retained instrument. Electronically Signed   By: Franchot Gallo M.D.   On: 09/27/2019 12:50    Assessment: 74 yo man with right renal mass POD2 s/p right hand-assisted laparoscopic radical nephrectomy.  Awaiting return of bowel function and pain control. Patient appears more comfortable this AM and mental status is appropriate.  Creatinine has not yet peaked and will check bmp again tomorrow.  Plan: -AM labs pending -continue OOB and ambulate today (appreciate PT assistance) -greatly appreciate recommendations from Dr. Domingo Cocking on pain control -continue clears until flatus -continue SCD sand lovenox for DVT ppx -if CBC  stable today will resume patient home anti-coagulation tomorrow -continue home medications   Simone Curia, MD Urology 09/29/2019, 7:47 AM

## 2019-09-30 LAB — BASIC METABOLIC PANEL
Anion gap: 8 (ref 5–15)
BUN: 8 mg/dL (ref 8–23)
CO2: 27 mmol/L (ref 22–32)
Calcium: 8.4 mg/dL — ABNORMAL LOW (ref 8.9–10.3)
Chloride: 95 mmol/L — ABNORMAL LOW (ref 98–111)
Creatinine, Ser: 1.23 mg/dL (ref 0.61–1.24)
GFR calc Af Amer: >60 mL/min (ref >60)
GFR calc non Af Amer: 59 mL/min — ABNORMAL LOW (ref >60)
Glucose, Bld: 119 mg/dL — ABNORMAL HIGH (ref 70–99)
Potassium: 3.5 mmol/L (ref 3.5–5.1)
Sodium: 130 mmol/L — ABNORMAL LOW (ref 135–145)

## 2019-09-30 MED ORDER — OXYCODONE HCL 5 MG PO TABS
30.0000 mg | ORAL_TABLET | ORAL | Status: DC | PRN
  Administered 2019-09-30 – 2019-10-01 (×5): 30 mg via ORAL
  Filled 2019-09-30 (×5): qty 6

## 2019-09-30 MED ORDER — SENNOSIDES-DOCUSATE SODIUM 8.6-50 MG PO TABS: 2.0000 | ORAL_TABLET | Freq: Every day | ORAL | 1 refills | Status: AC | PRN

## 2019-09-30 MED ORDER — OXYCODONE HCL 5 MG PO TABS: 30.0000 mg | ORAL_TABLET | ORAL | 0 refills | Status: AC | PRN

## 2019-09-30 NOTE — Discharge Summary (Signed)
Date of admission: 09/27/2019  Date of discharge: 10/01/2019  Admission diagnosis: right renal mass  Discharge diagnosis: right renal mass  Secondary diagnoses:  Patient Active Problem List   Diagnosis Date Noted  . Right renal mass 09/27/2019  . Pre-operative clearance 09/15/2019  . History of non-ST elevation myocardial infarction (NSTEMI) 09/15/2019  . Paroxysmal atrial fibrillation ([place]) 09/01/2019  . Acquired thrombophilia ([place]) 09/01/2019  . Traumatic retroperitoneal hematoma 06/28/2019  . Mild cognitive impairment   . Acute encephalopathy 11/06/2018  . Left leg swelling 05/16/2018  . Alcohol abuse 05/16/2018  . Depression 05/16/2018  . Toxic encephalopathy   . Marijuana abuse   . Encephalopathy 05/15/2018  . Gait disturbance, post-stroke   . History of CVA (cerebrovascular accident) 05/04/2018  . Spastic hemiparesis of left nondominant side due to acute cerebral infarction ([place])   . 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[place])   . Essential hypertension   . Hyponatremia   . Non-ST elevation (NSTEMI) myocardial infarction ([place])   . Ventricular tachycardia ([place])   . CVA (cerebral vascular accident) ([place]) 04/25/2018  . Angioedema 04/18/2018  . History of right inguinal hernia repair 02/10/2018  . Acute kidney injury ([place]) 12/10/2017  . Urinary retention 04/25/2016  . Acute renal failure syndrome ([place])   . Low back pain with sciatica 04/03/2015  . H/O total hip arthroplasty 04/03/2015  . Idiopathic peripheral neuropathy 09/29/2014  . Chronic pain 09/28/2014  . DDD (degenerative disc disease), lumbar 12/30/2012  . Degenerative arthritis of thoracic spine 12/30/2012  . Localized osteoarthrosis 12/13/2012  . Chronic pain associated with significant psychosocial dysfunction 12/13/2012  . Polypharmacy 12/13/2012  . Long term current use of opiate analgesic 12/13/2012    Procedures performed: Procedure(s): HAND ASSISTED LAPAROSCOPIC NEPHRECTOMY  History and Physical: For full details,  please see admission history and physical. Briefly, William Jordan is a 74 y.o. year old patient with right renal mass concerning for renal cell carcinoma.  He underwent right hand-assisted laparoscopic nephrectomy and was discharged home in stable condition on POD 4.   Hospital Course: Patient tolerated the procedure well.  He was then transferred to the floor after an uneventful PACU stay.  His hospital course was uncomplicated.  On POD#4 he had met discharge criteria: was eating a regular diet, was up and ambulating independently,  pain was well controlled, was voiding without a catheter, and was ready to for discharge.   Pt with chronic pain at baseline and required large amounts of narcotics with assistance of pain management service. PT eval rec home health and this is arranged with rolling walker.   Final path stage 2 grade 2 renal cancer and GFR excellent with Cr 1.23.   Laboratory values:  Recent Labs    09/28/19 0525 09/29/19 0739  WBC 8.9 9.0  HGB 12.4* 12.6*  HCT 37.3* 37.7*   Recent Labs    09/28/19 0525 09/29/19 0739 09/30/19 0547  NA 132* 133* 130*  K 3.9 3.8 3.5  CL 93* 93* 95*  CO2 '28 31 27  ' GLUCOSE 125* 116* 119*  BUN '8 8 8  ' CREATININE 1.23 1.32* 1.23  CALCIUM 8.8* 8.9 8.4*   No results for input(s): LABPT, INR in the last 72 hours. No results for input(s): LABURIN in the last 72 hours. Results for orders placed or performed during the hospital encounter of 09/23/19  Novel Coronavirus, NAA (Hosp order, Send-out to Ref Lab; TAT 18-24 hrs     Status: None   Collection Time: 09/23/19  8:53 AM  Specimen: Nasopharyngeal Swab; Respiratory  Result Value Ref Range Status   SARS-CoV-2, NAA NOT DETECTED NOT DETECTED Final    Comment: (NOTE) Testing was performed using the cobas(R) SARS-CoV-2 test. This nucleic acid amplification test was developed and its performance characteristics determined by [employer]. Nucleic acid amplification tests include PCR  and TMA. This test has not been FDA cleared or approved. This test has been authorized by FDA under an Emergency Use Authorization (EUA). This test is only authorized for the duration of time the declaration that circumstances exist justifying the authorization of the emergency use of in vitro diagnostic tests for detection of SARS-CoV-2 virus and/or diagnosis of COVID-19 infection under section 564(b)(1) of the Act, 21 U.S.C. 413KGM-0(N) (1), unless the authorization is terminated or revoked sooner. When diagnostic testing is negative, the possibility of a false negative result should be considered in the context of a patient's recent exposures and the presence of clinical signs and symptoms consistent with COVID-19. An individual without s ymptoms of COVID- 19 and who is not shedding SARS-CoV-2 virus would expect to have a negative (not detected) result in this assay. Performed At: [facility] [address] [place], [place] [id number] Rush Farmer MD QI:[id number]    [place]  Final    Comment: Performed at [facility], [place] [address], [place], [place] [address]    Disposition: Home  Discharge instruction: The patient was instructed to be ambulatory but told to refrain from heavy lifting, strenuous activity, or driving.    Followup:  Follow-up Information    Robley Fries, MD Follow up on 10/11/2019.   Specialty: Urology Why: 2:45pm Contact information: [address] [place] [address] [phone]

## 2019-09-30 NOTE — Progress Notes (Signed)
Daily Progress Note   Patient Name: KINDLE CARVER       Date: 09/30/2019 DOB: 01/20/1946  Age: 74 y.o. MRN#: [id number] Attending Physician: Robley Fries, MD Primary Care Physician: Townsend Roger, MD Admit Date: 09/27/2019  Reason for Consultation/Follow-up: Pain control  Subjective: I saw and examined Mr. Alford today.  He reports that his pain is much better overall.  MAR review reveals that he has had 8 doses of 1mg  of IV dilaudid as well as 4 doses of 20mg  of oral oxycodone.  Total oral morphine equivalent of approximately 280mg  over the last 24 hours.   Last evening, I discussed case with Dr. Hardin Negus, who follows him as OP for his pain management, and he agreed plan for discharge with oxycodone 30mg  every 4 hours as needed for the next week is reasonable.  Appreciate his expertise.  Length of Stay: 3  Current Medications: Scheduled Meds:  . acetaminophen  650 mg Oral Q6H  . amLODipine  10 mg Oral Daily  . aspirin  325 mg Oral Daily  . atorvastatin  40 mg Oral q1800  . Chlorhexidine Gluconate Cloth  6 each Topical Daily  . clopidogrel  75 mg Oral Daily  . colchicine  0.6 mg Oral BID  . divalproex  500 mg Oral Q12H  . docusate sodium  100 mg Oral BID  . loratadine  10 mg Oral Daily  . magnesium gluconate  500 mg Oral BID  . pantoprazole  40 mg Oral Daily  . senna  1 tablet Oral BID    Continuous Infusions: . dextrose 5 % and 0.45% NaCl 100 mL/hr at 09/30/19 0732    PRN Meds: HYDROmorphone (DILAUDID) injection, ondansetron, opium-belladonna, oxyCODONE, traZODone  Physical Exam      General: Alert, awake, in no acute distress.  HEENT: No bruits, no goiter, no JVD Heart: Regular rate and rhythm. No murmur appreciated. Lungs: Good air movement, clear Ext: R hand  swollen Skin: Warm and dry Neuro: Grossly intact, nonfocal.      Vital Signs: BP 116/88 (BP Location: Right Arm)   Pulse 87   Temp 98 F (36.7 C) (Oral)   Resp 20   Ht 5\' 9"  (1.753 m)   Wt 83.2 kg   SpO2 96%   BMI 27.09 kg/m  SpO2: SpO2: 96 %  O2 Device: O2 Device: Room Air O2 Flow Rate: O2 Flow Rate (L/min): 2 L/min  Intake/output summary:   Intake/Output Summary (Last 24 hours) at 09/30/2019 1232 Last data filed at 09/30/2019 1000 Gross per 24 hour  Intake 2055.48 ml  Output 1100 ml  Net 955.48 ml   LBM: Last BM Date: 09/25/19 Baseline Weight: Weight: 79.8 kg Most recent weight: Weight: 83.2 kg       Palliative Assessment/Data:    Flowsheet Rows     Most Recent Value  Intake Tab  Referral Department  Hospitalist  Unit at Time of Referral  Med/Surg Unit  Palliative Care Primary Diagnosis  Pain  Date Notified  09/28/19  Palliative Care Type  New Palliative care  Reason for referral  Pain  Date of Admission  09/27/19  Date first seen by Palliative Care  09/28/19  # of days Palliative referral response time  0 Day(s)  # of days IP prior to Palliative referral  1  Clinical Assessment  Palliative Performance Scale Score  80%  Psychosocial & Spiritual Assessment  Palliative Care Outcomes  Patient/Family meeting held?  No      Patient Active Problem List   Diagnosis Date Noted  . Right renal mass 09/27/2019  . Pre-operative clearance 09/15/2019  . History of non-ST elevation myocardial infarction (NSTEMI) 09/15/2019  . Paroxysmal atrial fibrillation ([place]) 09/01/2019  . Acquired thrombophilia ([place]) 09/01/2019  . Traumatic retroperitoneal hematoma 06/28/2019  . Mild cognitive impairment   . Acute encephalopathy 11/06/2018  . Left leg swelling 05/16/2018  . Alcohol abuse 05/16/2018  . Depression 05/16/2018  . Toxic encephalopathy   . Marijuana abuse   . Encephalopathy 05/15/2018  . Gait disturbance, post-stroke   . History of CVA (cerebrovascular  accident) 05/04/2018  . Spastic hemiparesis of left nondominant side due to acute cerebral infarction ([place])   . 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[place])   . Essential hypertension   . Hyponatremia   . Non-ST elevation (NSTEMI) myocardial infarction ([place])   . Ventricular tachycardia ([place])   . CVA (cerebral vascular accident) ([place]) 04/25/2018  . Angioedema 04/18/2018  . History of right inguinal hernia repair 02/10/2018  . Acute kidney injury ([place]) 12/10/2017  . Urinary retention 04/25/2016  . Acute renal failure syndrome ([place])   . Low back pain with sciatica 04/03/2015  . H/O total hip arthroplasty 04/03/2015  . Idiopathic peripheral neuropathy 09/29/2014  . Chronic pain 09/28/2014  . DDD (degenerative disc disease), lumbar 12/30/2012  . Degenerative arthritis of thoracic spine 12/30/2012  . Localized osteoarthrosis 12/13/2012  . Chronic pain associated with significant psychosocial dysfunction 12/13/2012  . Polypharmacy 12/13/2012  . Long term current use of opiate analgesic 12/13/2012    Palliative Care Assessment & Plan   Patient Profile: 74 year old male with past medical history of anxiety, arthritis, chronic back pain, CVA, MI, hard of hearing, hypertension, neuropathy, spinal cord stimulator (turned off per past medical history) who was admitted with renal mass.  He is now status post nephrectomy for 5 cm right lower pole mass by Dr. Claudia Desanctis on 1/12.  Mass first noted in March 2019 with interval increase in size on CT in October 2020.  He reports pain is not controlled despite current pain regimen.  Palliative consulted for assistance with pain control recommendations.  Recommendations/Plan:  Pain: Better control today.  For discharge, I recommend a short-term increase in his outpatient dosing regimen oxycodone 30 mg every 4 hours as needed for pain for the next week while he is acutely  post-surgery.  Oxycodone 30 mg with 6 doses daily would be an oral morphine equivalent of 270 mg of oral  morphine which would be roughly the same as his most recent 24-hour need which had an oral morphine equivalent of 280 mg.  I discussed this with Dr. Claudia Desanctis as well as Dr. Hardin Negus last evening.  As plan clear and approaching discharge, palliative care will no longer follow daily but are happy to assist as needed.  Please call if there are specific areas with which we can be of assistance in the care of Mr. Wasdin.  Code Status:    Code Status Orders  (From admission, onward)         Start     Ordered   09/27/19 1723  Full code  Continuous     09/27/19 1722        Code Status History    Date Active Date Inactive Code Status Order ID Comments User Context   06/28/2019 2302 07/01/2019 2116 Full Code [id number]  Clovis Riley, MD ED   11/06/2018 0819 11/07/2018 1811 Full Code [id number]  Neva Seat, MD ED   05/15/2018 2139 05/17/2018 1715 Full Code [id number]  Katherine Roan, MD ED   05/04/2018 1750 05/07/2018 1300 Full Code [id number]  Cathlyn Parsons, PA-C Inpatient   05/04/2018 1750 05/04/2018 1750 Full Code [id number]  Cathlyn Parsons, PA-C Inpatient   05/03/2018 2010 05/04/2018 1749 Full Code [id number]  Tommie Raymond, NP Inpatient   04/28/2018 1811 05/03/2018 1524 Full Code [id number]  Cathlyn Parsons, PA-C Inpatient   04/28/2018 1811 04/28/2018 1811 Full Code [id number]  Cathlyn Parsons, PA-C Inpatient   04/25/2018 1752 04/28/2018 1803 Full Code [id number]  Ledell Noss, MD Inpatient   12/10/2017 1954 12/12/2017 1456 Full Code [id number]  [name], MD ED   09/26/2015 1204 10/02/2015 2006 Full Code [id number]  Minor, Grace Bushy, NP ED   08/14/2015 2316 08/17/2015 1825 Full Code [id number]  Allyne Gee, MD Inpatient   06/16/2015 0319 06/18/2015 2156 Full Code [id number]  Theressa Millard, MD Inpatient   06/05/2015 2017 06/12/2015 1836 Full Code [id number]  Kelvin Cellar, MD ED   09/28/2014 1123 09/29/2014 1450 Full Code [id number]  Melina Schools, MD Inpatient   Advance Care Planning  Activity      Care plan was discussed with patient, RN, Dr. Claudia Desanctis, patient's wife  Thank you for allowing the Palliative Medicine Team to assist in the care of this patient.   Total Time 20 Prolonged Time Billed No      Greater than 50%  of this time was spent counseling and coordinating care related to the above assessment and plan.  Micheline Rough, MD  Please contact Palliative Medicine Team phone at [phone] for questions and concerns.

## 2019-09-30 NOTE — Discharge Instructions (Signed)
1. Activity:  You are encouraged to ambulate frequently (about every hour during waking hours) to help prevent blood clots from forming in your legs or lungs.  However, you should not engage in any heavy lifting (> 10-15 lbs), strenuous activity, or straining. 2. Diet: You should continue a clear liquid diet until passing gas from below if you are not doing so already.  Once this occurs, you may advance your diet to a soft diet that would be easy to digest (i.e soups, scrambled eggs, mashed potatoes, etc.) for 24 hours just as you would if getting over a bad stomach flu.  If tolerating this diet well for 24 hours, you may then begin eating regular food.  It will be normal to have some amount of bloating, nausea, and abdominal discomfort intermittently. 3. Prescriptions:  You will be provided a prescription for pain medication to take as needed.  If your pain is not severe enough to require the prescription pain medication, you may take extra strength Tylenol instead.  You should also take an over the counter stool softener (Colace 100 mg twice daily) to avoid straining with bowel movements as the pain medication may constipate you. Finally, you may also be provided a prescription for an antibiotic. 4. Catheter care: (If you are discharged with a catheter) You will be taught how to take care of the catheter by the nursing staff prior to discharge from the hospital.  You may use both a leg bag and the larger bedside bag but it is recommended to at least use the bigger bedside bag at nighttime as the leg bag is small and will fill up overnight and also does not drain as well when lying flat. You may periodically feel a strong urge to void with the catheter in place.  This is a bladder spasm and most often can occur when having a bowel movement or when you are moving around. It can be associated with a small amount of urine leaking out around the catheter. It is typically self-limited and usually will stop after a  few minutes.  You may use some Vaseline or Neosporin around the tip of the catheter to reduce friction at the tip of the penis. 5. Incisions:   Once the bandage is removed, the incisions may stay open to air.  You may start showering (not soaking or bathing in water) 48 hours after surgery and the incision simply needs to be patted dry after the shower.  No additional care is needed. You may choose to reapply a dry gauze dressing to prevent the incision from rubbing against your clothing. 6. What to call us about: You should call the office [phone]) if you develop fever > 101, persistent vomiting, or the catheter stops draining. Also, feel free to call with any other questions you may have.

## 2019-09-30 NOTE — Progress Notes (Signed)
Urology Inpatient Progress Report  Right renal mass [N28.89]  Procedure(s): HAND ASSISTED LAPAROSCOPIC NEPHRECTOMY  3 Days Post-Op   Intv/Subj: No overnight events. Patient pain much better controlled this AM.  He is feeling "great."  No flatus.  +OOB but says it's getting easier.   Active Problems:   Right renal mass  Current Facility-Administered Medications  Medication Dose Route Frequency Provider Last Rate Last Admin  . acetaminophen (TYLENOL) tablet 650 mg  650 mg Oral Q6H Micheline Rough, MD   650 mg at 09/30/19 0853  . amLODipine (NORVASC) tablet 10 mg  10 mg Oral Daily Jacalyn Lefevre D, MD   10 mg at 09/30/19 0853  . aspirin tablet 325 mg  325 mg Oral Daily Jacalyn Lefevre D, MD   325 mg at 09/30/19 0855  . atorvastatin (LIPITOR) tablet 40 mg  40 mg Oral q1800 Jacalyn Lefevre D, MD   40 mg at 09/29/19 1657  . Chlorhexidine Gluconate Cloth 2 % PADS 6 each  6 each Topical Daily Robley Fries, MD   6 each at 09/30/19 0859  . clopidogrel (PLAVIX) tablet 75 mg  75 mg Oral Daily Jacalyn Lefevre D, MD   75 mg at 09/30/19 0855  . colchicine tablet 0.6 mg  0.6 mg Oral BID Jacalyn Lefevre D, MD   0.6 mg at 09/30/19 0854  . dextrose 5 %-0.45 % sodium chloride infusion   Intravenous Continuous Jacalyn Lefevre D, MD 100 mL/hr at 09/30/19 0732 New Bag at 09/30/19 0732  . divalproex (DEPAKOTE) DR tablet 500 mg  500 mg Oral Q12H Jacalyn Lefevre D, MD   500 mg at 09/30/19 0854  . docusate sodium (COLACE) capsule 100 mg  100 mg Oral BID Jacalyn Lefevre D, MD   100 mg at 09/30/19 0854  . HYDROmorphone (DILAUDID) injection 1 mg  1 mg Intravenous Q2H PRN Micheline Rough, MD   1 mg at 09/30/19 0426  . loratadine (CLARITIN) tablet 10 mg  10 mg Oral Daily Jacalyn Lefevre D, MD   10 mg at 09/30/19 0854  . magnesium gluconate (MAGONATE) tablet 500 mg  500 mg Oral BID Jacalyn Lefevre D, MD   500 mg at 09/30/19 0858  . ondansetron (ZOFRAN) injection 4 mg  4 mg Intravenous Q4H PRN Jacalyn Lefevre D, MD       . opium-belladonna (B&O SUPPRETTES) 16.2-60 MG suppository 1 suppository  1 suppository Rectal Q6H PRN Robley Fries, MD   1 suppository at 09/28/19 1822  . oxyCODONE (Oxy IR/ROXICODONE) immediate release tablet 20 mg  20 mg Oral Q4H PRN Micheline Rough, MD   20 mg at 09/30/19 0855  . pantoprazole (PROTONIX) EC tablet 40 mg  40 mg Oral Daily Jacalyn Lefevre D, MD   40 mg at 09/30/19 0853  . senna (SENOKOT) tablet 8.6 mg  1 tablet Oral BID Jacalyn Lefevre D, MD   8.6 mg at 09/30/19 0853  . traZODone (DESYREL) tablet 50 mg  50 mg Oral QHS PRN Jacalyn Lefevre D, MD   50 mg at 09/30/19 0140   Facility-Administered Medications Ordered in Other Encounters  Medication Dose Route Frequency Provider Last Rate Last Admin  . fentaNYL (SUBLIMAZE) injection    PRN [place], Peter M, MD   25 mcg at 05/03/18 1336  . Heparin (Porcine) in NaCl 1000-0.9 UT/500ML-% SOLN    PRN [place], Peter M, MD   500 mL at 05/03/18 1331  . Heparin (Porcine) in NaCl 1000-0.9 UT/500ML-% SOLN    PRN [place], Peter M,  MD   500 mL at 05/03/18 1332  . heparin injection    PRN [place], Peter M, MD   4,000 Units at 05/03/18 1345  . iopamidol (ISOVUE-370) 76 % injection    PRN [place], Peter M, MD   85 mL at 05/03/18 1357  . lidocaine (PF) (XYLOCAINE) 1 % injection    PRN [place], Peter M, MD   2 mL at 05/03/18 1342  . midazolam (VERSED) injection    PRN [place], Peter M, MD   1 mg at 05/03/18 1336  . Radial Cocktail/Verapamil only    PRN [place], Peter M, MD   Given at 05/03/18 1344     Objective: Vital: Vitals:   09/29/19 1404 09/29/19 2052 09/29/19 2111 09/30/19 0435  BP: (!) 142/112 (!) 150/113 (!) 160/88 116/88  Pulse: 96 (!) 105  87  Resp: 18 14  20   Temp: 97.9 F (36.6 C) 98.9 F (37.2 C)  98 F (36.7 C)  TempSrc: Oral Oral  Oral  SpO2: 96% 95%  96%  Weight:      Height:       I/Os: I/O last 3 completed shifts: In: 3300.1 [P.O.:1200; I.V.:2100.1] Out: 1550 [Urine:1300; Stool:250]  Physical Exam:  General: Patient  is in no apparent distress Lungs: Normal respiratory effort, chest expands symmetrically. GI: Incisions are c/d/i. Midline incision with staples. The abdomen is soft and nontender. Ext: lower extremities symmetric  Lab Results: Recent Labs    09/28/19 0525 09/29/19 0739  WBC 8.9 9.0  HGB 12.4* 12.6*  HCT 37.3* 37.7*   Recent Labs    09/28/19 0525 09/29/19 0739 09/30/19 0547  NA 132* 133* 130*  K 3.9 3.8 3.5  CL 93* 93* 95*  CO2 28 31 27   GLUCOSE 125* 116* 119*  BUN 8 8 8   CREATININE 1.23 1.32* 1.23  CALCIUM 8.8* 8.9 8.4*      Assessment: 74 yo man with right renal mass POD3 s/p right hand-assisted laparscopic nephrectomy.  Patient creatinine has peaked and is now improving.  Pain much better controlled.   Plan: -advance to general diet -restart plavix and ASA -pain medicine regimen per Dr. Kirstie Mirza help (30mg  oxycodone q 4 hour - pt to be discharged with one week supply and will follow up with Dr. Hardin Negus as an outpatient) -continue home meds -continue PT (will place home health PT order) -if patient does well with normal diet will d/c this afternoon   Jacalyn Lefevre, MD Urology 09/30/2019, 9:13 AM

## 2019-10-01 NOTE — Progress Notes (Signed)
Patient has been extremely confused, agitated, anxious and restless, repeated getting out of bed, pulling on tubes and not following any instructions.  Had to put NT outside the door to monitor him because he was very unsteady with his gait.  He kept getting up and down, to site on chair then the bed then back again for 6 hours.  He said this restlessness has happen to him before.  Bed alarm set.  Call MD to get med for restlessness but nothing ordered for it as MD thought restless might be due to sundown and he might get more restless with antianxiety med.

## 2019-10-01 NOTE — TOC Progression Note (Signed)
Transition of Care [facility]) - Progression Note    Patient Details  Name: ROMALE SMAW MRN: [id number] Date of Birth: 1946-06-07  [facility]) CM/SW Contact  Joaquin Courts, RN Phone Number: 10/01/2019, 10:23 AM  Clinical Narrative:   CM spoke with patient. Patient selects advanced home health, however agency rep notified CM that agency is unable to provide services out in [facility] at this time. Agency unable to accept referral.  CM discussed this with patient. Patient set up with [facility] for [place]. [employer] to deliver rolling walker with seat to bedside for home use.       Expected Discharge Plan: [place] Barriers to Discharge: No Barriers Identified  Expected Discharge Plan and Services Expected Discharge Plan: [place]   Discharge Planning Services: CM Consult Post Acute Care Choice: [place] arrangements for the past 2 months: Single Family Home Expected Discharge Date: 10/01/19               DME Arranged: Gilford Rile rolling with seat DME Agency: [employer] Date DME Agency Contacted: 10/01/19 Time DME Agency Contacted: 12 Representative spoke with at DME Agency: [place]: PT [place]: [facility] [employer]) Date [place]: 10/01/19 Time [place]: 57 Representative spoke with at [place]: [place] ([place]) Interventions    Readmission Risk Interventions Readmission Risk Prevention Plan 07/01/2019  Transportation Screening Complete  PCP or Specialist Appt within 3-5 Days Complete  HRI or [place] Complete  Social Work Consult for [place] Planning/Counseling Patient refused  Palliative Care Screening Not Applicable  Medication Review Press photographer) Complete  Some recent data might be hidden

## 2019-10-01 NOTE — TOC Progression Note (Signed)
Transition of Care [facility]) - Progression Note    Patient Details  Name: William Jordan MRN: [id number] Date of Birth: July 19, 1946  [facility]) CM/SW Contact  Joaquin Courts, RN Phone Number: 10/01/2019, 1:59 PM  Clinical Narrative:    CM received call from [employer] rep stating they have previously accepted patient for [facility] services and are ready to go out to the home tomorrow 1/17.  CM spoke with patient about this and he is agreeable to this service. Will cancel [facility] referral.     Expected Discharge Plan: [place] Barriers to Discharge: No Barriers Identified  Expected Discharge Plan and Services Expected Discharge Plan: [place]   Discharge Planning Services: CM Consult Post Acute Care Choice: [place] arrangements for the past 2 months: Single Family Home Expected Discharge Date: 10/01/19               DME Arranged: Gilford Rile rolling with seat DME Agency: [employer] Date DME Agency Contacted: 10/01/19 Time DME Agency Contacted: 75 Representative spoke with at DME Agency: [place]: PT [place]: [facility] [employer] Date [address]: 10/01/19 Time [place]: 66 Representative spoke with at [place]: [place] ([place]) Interventions    Readmission Risk Interventions Readmission Risk Prevention Plan 07/01/2019  Transportation Screening Complete  PCP or Specialist Appt within 3-5 Days Complete  HRI or [place] Complete  Social Work Consult for [place] Planning/Counseling Patient refused  Palliative Care Screening Not Applicable  Medication Review Press photographer) Complete  Some recent data might be hidden

## 2019-10-02 DIAGNOSIS — C641 Malignant neoplasm of right kidney, except renal pelvis: Secondary | ICD-10-CM | POA: Diagnosis not present

## 2019-10-02 DIAGNOSIS — I480 Paroxysmal atrial fibrillation: Secondary | ICD-10-CM | POA: Diagnosis not present

## 2019-10-02 DIAGNOSIS — G629 Polyneuropathy, unspecified: Secondary | ICD-10-CM | POA: Diagnosis not present

## 2019-10-02 DIAGNOSIS — Z483 Aftercare following surgery for neoplasm: Secondary | ICD-10-CM | POA: Diagnosis not present

## 2019-10-02 DIAGNOSIS — I10 Essential (primary) hypertension: Secondary | ICD-10-CM | POA: Diagnosis not present

## 2019-10-02 DIAGNOSIS — I69354 Hemiplegia and hemiparesis following cerebral infarction affecting left non-dominant side: Secondary | ICD-10-CM | POA: Diagnosis not present

## 2019-10-02 DIAGNOSIS — R339 Retention of urine, unspecified: Secondary | ICD-10-CM | POA: Diagnosis not present

## 2019-10-02 DIAGNOSIS — I252 Old myocardial infarction: Secondary | ICD-10-CM | POA: Diagnosis not present

## 2019-10-02 DIAGNOSIS — N401 Enlarged prostate with lower urinary tract symptoms: Secondary | ICD-10-CM | POA: Diagnosis not present

## 2019-10-02 DIAGNOSIS — Z905 Acquired absence of kidney: Secondary | ICD-10-CM | POA: Diagnosis not present

## 2019-10-03 DIAGNOSIS — N2889 Other specified disorders of kidney and ureter: Secondary | ICD-10-CM | POA: Diagnosis not present

## 2019-10-10 DIAGNOSIS — G603 Idiopathic progressive neuropathy: Secondary | ICD-10-CM | POA: Diagnosis not present

## 2019-10-10 DIAGNOSIS — Z79891 Long term (current) use of opiate analgesic: Secondary | ICD-10-CM | POA: Diagnosis not present

## 2019-10-10 DIAGNOSIS — G894 Chronic pain syndrome: Secondary | ICD-10-CM | POA: Diagnosis not present

## 2019-10-10 DIAGNOSIS — M47816 Spondylosis without myelopathy or radiculopathy, lumbar region: Secondary | ICD-10-CM | POA: Diagnosis not present

## 2019-10-11 DIAGNOSIS — C641 Malignant neoplasm of right kidney, except renal pelvis: Secondary | ICD-10-CM | POA: Diagnosis not present

## 2019-10-11 DIAGNOSIS — S36892A Contusion of other intra-abdominal organs, initial encounter: Secondary | ICD-10-CM | POA: Diagnosis not present

## 2019-10-17 DIAGNOSIS — I10 Essential (primary) hypertension: Secondary | ICD-10-CM | POA: Diagnosis not present

## 2019-10-17 DIAGNOSIS — Z483 Aftercare following surgery for neoplasm: Secondary | ICD-10-CM | POA: Diagnosis not present

## 2019-10-17 DIAGNOSIS — I252 Old myocardial infarction: Secondary | ICD-10-CM | POA: Diagnosis not present

## 2019-10-17 DIAGNOSIS — G629 Polyneuropathy, unspecified: Secondary | ICD-10-CM | POA: Diagnosis not present

## 2019-10-17 DIAGNOSIS — I69354 Hemiplegia and hemiparesis following cerebral infarction affecting left non-dominant side: Secondary | ICD-10-CM | POA: Diagnosis not present

## 2019-10-17 DIAGNOSIS — N401 Enlarged prostate with lower urinary tract symptoms: Secondary | ICD-10-CM | POA: Diagnosis not present

## 2019-10-17 DIAGNOSIS — R339 Retention of urine, unspecified: Secondary | ICD-10-CM | POA: Diagnosis not present

## 2019-10-17 DIAGNOSIS — C641 Malignant neoplasm of right kidney, except renal pelvis: Secondary | ICD-10-CM | POA: Diagnosis not present

## 2019-10-17 DIAGNOSIS — I480 Paroxysmal atrial fibrillation: Secondary | ICD-10-CM | POA: Diagnosis not present

## 2019-10-17 DIAGNOSIS — Z905 Acquired absence of kidney: Secondary | ICD-10-CM | POA: Diagnosis not present

## 2019-11-07 DIAGNOSIS — I10 Essential (primary) hypertension: Secondary | ICD-10-CM | POA: Diagnosis not present

## 2019-11-11 DIAGNOSIS — S36892A Contusion of other intra-abdominal organs, initial encounter: Secondary | ICD-10-CM | POA: Diagnosis not present

## 2019-11-18 IMAGING — CT CT ANGIO HEAD
2 of 12 series · 7 of 35 positions shown · IV contrast (APPLIED)
Comparison: Head without contrast 06/23/2017

ADDENDUM:
Cortical hypoattenuation along the right pre and postcentral gyrus
is consistent with acute/subacute nonhemorrhagic infarct there is
effacement adjacent sulci. Associated subacute arachnoid hemorrhage
is considered less likely. This is likely all related to a subacute
ischemic event. Findings were discussed with Dr. Whitlow at [DATE] p.m..
CLINICAL DATA: Left-sided weakness. Walking for 2 days. Cerebral
aneurysm subarachnoid hemorrhage, cerebral vasospasm evaluation.

EXAM:
CT ANGIOGRAPHY HEAD AND NECK
TECHNIQUE: Multidetector CT imaging of the head and neck was performed using
the standard protocol during bolus administration of intravenous
contrast. Multiplanar CT image reconstructions and MIPs were
obtained to evaluate the vascular anatomy. Carotid stenosis
measurements (when applicable) are obtained utilizing NASCET
criteria, using the distal internal carotid diameter as the
denominator.
CONTRAST:  50mL Y7QPHS-O7C IOPAMIDOL (Y7QPHS-O7C) INJECTION 76%

[Series 9: cta neck/head · axial · 0.53mm/px · z∈[-333,+13]mm · 3 of 174 slices shown]
[im 1/174  soft-tissue]
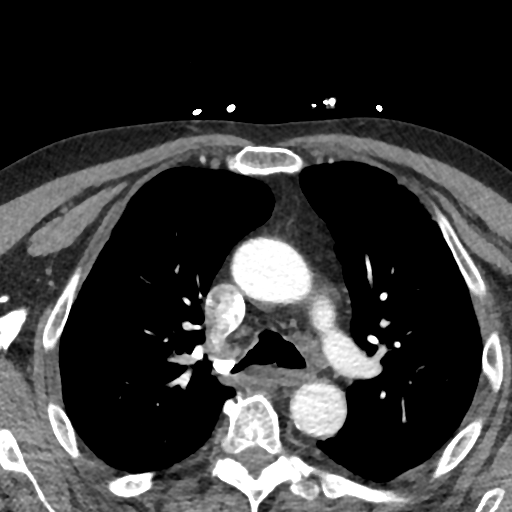
[im 87/174  bone]
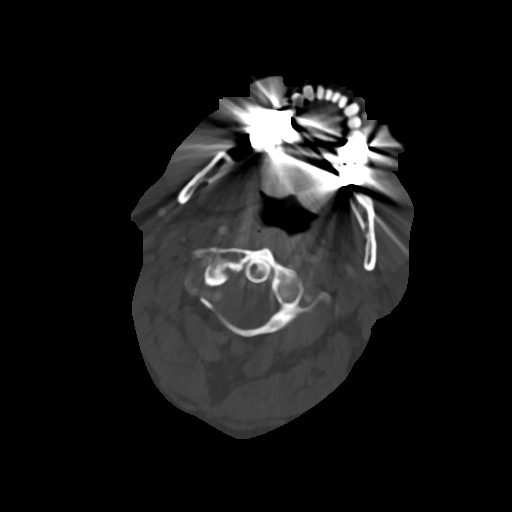
[im 174/174  soft-tissue]
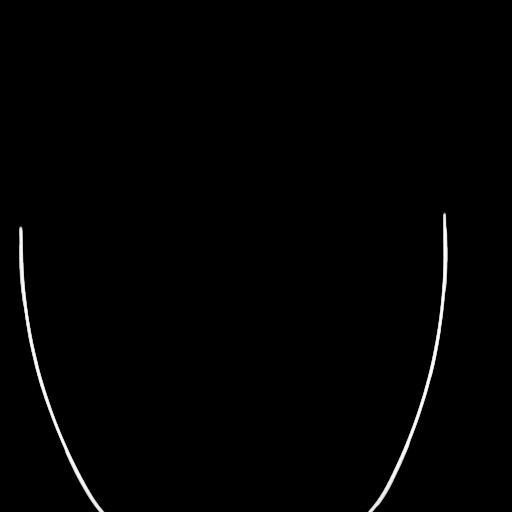

[Series 11: ax thins · axial · 0.47mm/px · z∈[-290,-87]mm · 4 of 341 slices shown]
[im 69/341  soft-tissue]
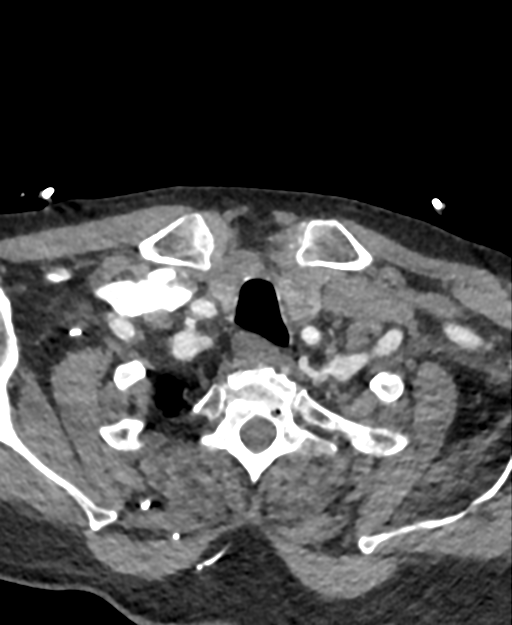
[im 137/341  soft-tissue]
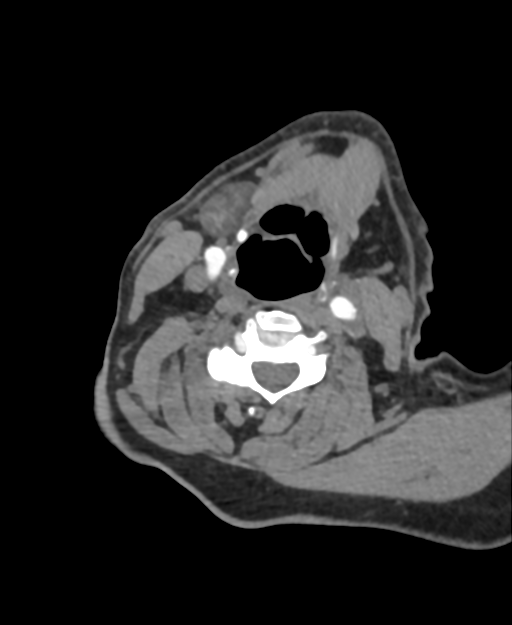
[im 205/341  soft-tissue]
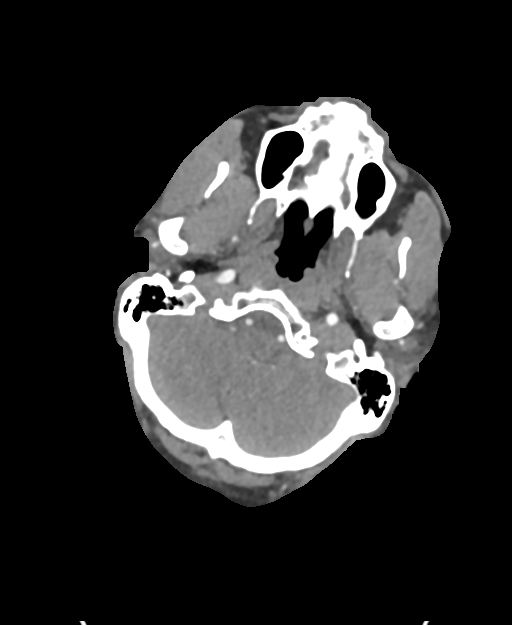
[im 273/341  soft-tissue]
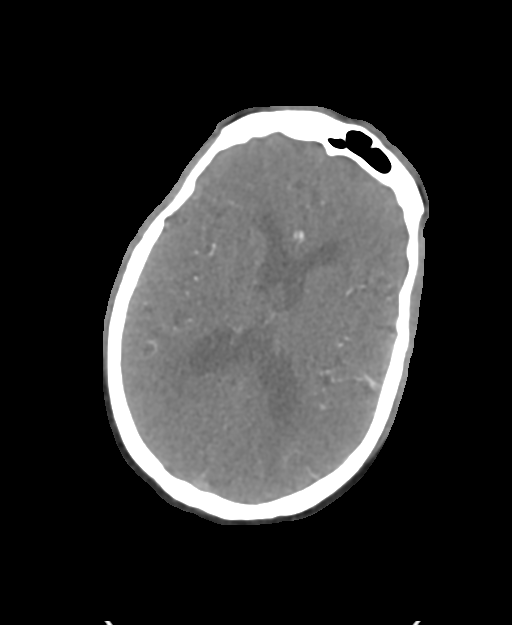

[7 of 35 positions shown; findings below may reference images not displayed]

FINDINGS: CT HEAD FINDINGS

Brain: Gray matter hypoattenuation is present along the right pre
and postcentral gyrus. No hemorrhage is present. There is no mass
lesion. Ventricles proportionate to the degree of atrophy. Moderate
diffuse white matter hypoattenuation is present bilaterally.
Brainstem and cerebellum are otherwise normal.

Vascular: Atherosclerotic calcifications are present within the
cavernous internal carotid arteries bilaterally. There is hyperdense
vessel.

Skull: Calvarium is intact. No focal lytic or blastic lesions are
present.

Sinuses: The paranasal sinuses and mastoid air cells are clear.

Orbits: Globes and orbits are within bilaterally.

Review of the MIP images confirms the above findings

CTA NECK FINDINGS

Aortic arch: There is a common origin of the innominate and left
common carotid artery. Mild atherosclerotic changes are present..
There is no significant stenosis the great vessel origins

Right carotid system: The right common carotid artery is within
normal limits proximally. Eccentric atherosclerotic changes are
present in the distal right common carotid artery. Lumen is narrowed
to 3.5 mm. There is no stenosis relative to the more distal vessel.
Atherosclerotic calcifications are present at the carotid
bifurcation without significant stenosis in the cervical ICA.

Left carotid system: Left carotid artery is tortuous proximally.
Atherosclerotic changes are present at the left carotid bifurcation.
No significant stenosis relative the distal vessel. The cervical
left ICA is normal.

Vertebral arteries: The vertebral arteries are codominant. Both
vertebral arteries originate from the subclavian arteries without
significant stenosis. No significant injury or stenosis to either
vertebral artery in the neck.

Skeleton: Multilevel degenerative changes are present in the
cervical spine. Grade 1 anterolisthesis is present at C3-4. There is
chronic loss disc height with endplate uncovertebral disease at
C4-5, C5-6, and C6-7. Osseous foraminal narrowing is greater right
than left at C4-5 and C5-6.

Other neck: The soft tissues of the neck are otherwise unremarkable.
Salivary glands are within normal limits. No significant adenopathy
is present. Thyroid is heterogeneous without a dominant lesion. No
focal mucosal lesions are present.

Upper chest: Paraseptal emphysematous changes are present along a
disease. There is no focal nodule or mass lesion. Effusion is
present. There is no pneumothorax. The thoracic inlet is within
normal limits.

Review of the MIP images confirms the above findings

CTA HEAD FINDINGS

Anterior circulation: Atherosclerotic calcifications are present in
the cavernous internal carotid arteries bilaterally. There is no
significant stenosis from the skull base to the ICA termini. The A1
and M1 segments are normal. The anterior communicating artery is
patent. MCA bifurcations are within normal limits. There is moderate
attenuation of distal ACA and MCA branch vessels bilaterally without
a significant proximal stenosis or occlusion

Posterior circulation: The vertebral arteries are codominant.
Atherosclerotic changes are present at the dural margin of the left
vertebral artery without significant stenosis. Vertebrobasilar
junction is normal. Both posterior cerebral arteries originate from
the basilar tip. There is some attenuation of distal PCA branch
vessels bilaterally without a significant proximal stenosis or
occlusion.

Venous sinuses: The dural sinuses are patent. Straight sinus and
deep cerebral veins are intact. Cortical veins are within normal
limits.

Anatomic variants: None

Delayed phase: The postcontrast images demonstrate no pathologic
enhancement.

Review of the MIP images confirms the above findings
IMPRESSION: 1. Atherosclerotic changes within the mid and distal right common
carotid artery narrows the lumen to 3.5 mm without a significant
stenosis relative to the more distal vessel.
2. Atherosclerotic changes bilaterally at the carotid bifurcations
without significant stenoses.
3. Atherosclerotic changes at the dural margin of the left vertebral
artery without a significant stenosis.
4. Atherosclerotic changes of the cavernous internal carotid
arteries bilaterally without significant stenosis.
5. Distal medium and small vessel disease is present throughout the
anterior and posterior circulation without a significant proximal
stenosis, aneurysm, or branch vessel occlusion otherwise.
6. Multilevel degenerative changes of the cervical spine as
described above. Grade 1 anterolisthesis is present at C3-4.

## 2019-11-21 ENCOUNTER — Encounter: Payer: PPO | Admitting: Cardiovascular Disease

## 2019-12-01 ENCOUNTER — Ambulatory Visit (HOSPITAL_COMMUNITY): Payer: PPO | Admitting: Physician Assistant

## 2019-12-01 ENCOUNTER — Encounter (HOSPITAL_COMMUNITY): Payer: Self-pay

## 2019-12-05 DIAGNOSIS — G894 Chronic pain syndrome: Secondary | ICD-10-CM | POA: Diagnosis not present

## 2019-12-05 DIAGNOSIS — M47816 Spondylosis without myelopathy or radiculopathy, lumbar region: Secondary | ICD-10-CM | POA: Diagnosis not present

## 2019-12-05 DIAGNOSIS — Z79891 Long term (current) use of opiate analgesic: Secondary | ICD-10-CM | POA: Diagnosis not present

## 2019-12-05 DIAGNOSIS — G603 Idiopathic progressive neuropathy: Secondary | ICD-10-CM | POA: Diagnosis not present

## 2019-12-09 DIAGNOSIS — S36892A Contusion of other intra-abdominal organs, initial encounter: Secondary | ICD-10-CM | POA: Diagnosis not present

## 2019-12-15 ENCOUNTER — Ambulatory Visit (INDEPENDENT_AMBULATORY_CARE_PROVIDER_SITE_OTHER): Payer: PPO | Admitting: *Deleted

## 2019-12-15 DIAGNOSIS — I63411 Cerebral infarction due to embolism of right middle cerebral artery: Secondary | ICD-10-CM

## 2019-12-15 LAB — CUP PACEART REMOTE DEVICE CHECK
Date Time Interrogation Session: 20210331191537
Implantable Pulse Generator Implant Date: 20190813

## 2019-12-16 NOTE — Progress Notes (Signed)
ILR Remote 

## 2020-01-08 DIAGNOSIS — I480 Paroxysmal atrial fibrillation: Secondary | ICD-10-CM | POA: Diagnosis not present

## 2020-01-08 DIAGNOSIS — Z87891 Personal history of nicotine dependence: Secondary | ICD-10-CM | POA: Diagnosis not present

## 2020-01-08 DIAGNOSIS — Z950 Presence of cardiac pacemaker: Secondary | ICD-10-CM | POA: Diagnosis not present

## 2020-01-08 DIAGNOSIS — R531 Weakness: Secondary | ICD-10-CM | POA: Diagnosis not present

## 2020-01-08 DIAGNOSIS — Z7902 Long term (current) use of antithrombotics/antiplatelets: Secondary | ICD-10-CM | POA: Diagnosis not present

## 2020-01-08 DIAGNOSIS — R0902 Hypoxemia: Secondary | ICD-10-CM | POA: Diagnosis not present

## 2020-01-08 DIAGNOSIS — I130 Hypertensive heart and chronic kidney disease with heart failure and stage 1 through stage 4 chronic kidney disease, or unspecified chronic kidney disease: Secondary | ICD-10-CM | POA: Diagnosis not present

## 2020-01-08 DIAGNOSIS — G8929 Other chronic pain: Secondary | ICD-10-CM | POA: Diagnosis not present

## 2020-01-08 DIAGNOSIS — J189 Pneumonia, unspecified organism: Secondary | ICD-10-CM | POA: Diagnosis not present

## 2020-01-08 DIAGNOSIS — R0789 Other chest pain: Secondary | ICD-10-CM | POA: Diagnosis not present

## 2020-01-08 DIAGNOSIS — R509 Fever, unspecified: Secondary | ICD-10-CM | POA: Diagnosis not present

## 2020-01-08 DIAGNOSIS — Z79891 Long term (current) use of opiate analgesic: Secondary | ICD-10-CM | POA: Diagnosis not present

## 2020-01-08 DIAGNOSIS — S36892A Contusion of other intra-abdominal organs, initial encounter: Secondary | ICD-10-CM | POA: Diagnosis not present

## 2020-01-08 DIAGNOSIS — Z905 Acquired absence of kidney: Secondary | ICD-10-CM | POA: Diagnosis not present

## 2020-01-08 DIAGNOSIS — Z9181 History of falling: Secondary | ICD-10-CM | POA: Diagnosis not present

## 2020-01-08 DIAGNOSIS — E861 Hypovolemia: Secondary | ICD-10-CM | POA: Diagnosis not present

## 2020-01-08 DIAGNOSIS — Z79899 Other long term (current) drug therapy: Secondary | ICD-10-CM | POA: Diagnosis not present

## 2020-01-08 DIAGNOSIS — I5020 Unspecified systolic (congestive) heart failure: Secondary | ICD-10-CM | POA: Diagnosis not present

## 2020-01-08 DIAGNOSIS — I252 Old myocardial infarction: Secondary | ICD-10-CM | POA: Diagnosis not present

## 2020-01-08 DIAGNOSIS — Z85528 Personal history of other malignant neoplasm of kidney: Secondary | ICD-10-CM | POA: Diagnosis not present

## 2020-01-08 DIAGNOSIS — R4189 Other symptoms and signs involving cognitive functions and awareness: Secondary | ICD-10-CM | POA: Diagnosis not present

## 2020-01-08 DIAGNOSIS — Z7982 Long term (current) use of aspirin: Secondary | ICD-10-CM | POA: Diagnosis not present

## 2020-01-08 DIAGNOSIS — K219 Gastro-esophageal reflux disease without esophagitis: Secondary | ICD-10-CM | POA: Diagnosis not present

## 2020-01-08 DIAGNOSIS — I5030 Unspecified diastolic (congestive) heart failure: Secondary | ICD-10-CM | POA: Diagnosis not present

## 2020-01-08 DIAGNOSIS — E7800 Pure hypercholesterolemia, unspecified: Secondary | ICD-10-CM | POA: Diagnosis not present

## 2020-01-08 DIAGNOSIS — R079 Chest pain, unspecified: Secondary | ICD-10-CM | POA: Diagnosis not present

## 2020-01-08 DIAGNOSIS — Z20822 Contact with and (suspected) exposure to covid-19: Secondary | ICD-10-CM | POA: Diagnosis not present

## 2020-01-08 DIAGNOSIS — R5381 Other malaise: Secondary | ICD-10-CM | POA: Diagnosis not present

## 2020-01-08 DIAGNOSIS — G3184 Mild cognitive impairment, so stated: Secondary | ICD-10-CM | POA: Diagnosis not present

## 2020-01-08 DIAGNOSIS — Z8673 Personal history of transient ischemic attack (TIA), and cerebral infarction without residual deficits: Secondary | ICD-10-CM | POA: Diagnosis not present

## 2020-01-08 DIAGNOSIS — R4020 Unspecified coma: Secondary | ICD-10-CM | POA: Diagnosis not present

## 2020-01-08 DIAGNOSIS — I110 Hypertensive heart disease with heart failure: Secondary | ICD-10-CM | POA: Diagnosis not present

## 2020-01-08 DIAGNOSIS — C649 Malignant neoplasm of unspecified kidney, except renal pelvis: Secondary | ICD-10-CM | POA: Diagnosis not present

## 2020-01-08 DIAGNOSIS — Z209 Contact with and (suspected) exposure to unspecified communicable disease: Secondary | ICD-10-CM | POA: Diagnosis not present

## 2020-01-08 DIAGNOSIS — M109 Gout, unspecified: Secondary | ICD-10-CM | POA: Diagnosis not present

## 2020-01-08 DIAGNOSIS — F329 Major depressive disorder, single episode, unspecified: Secondary | ICD-10-CM | POA: Diagnosis not present

## 2020-01-08 DIAGNOSIS — I2510 Atherosclerotic heart disease of native coronary artery without angina pectoris: Secondary | ICD-10-CM | POA: Diagnosis not present

## 2020-01-08 DIAGNOSIS — R0602 Shortness of breath: Secondary | ICD-10-CM | POA: Diagnosis not present

## 2020-01-08 DIAGNOSIS — N179 Acute kidney failure, unspecified: Secondary | ICD-10-CM | POA: Diagnosis not present

## 2020-01-08 DIAGNOSIS — N189 Chronic kidney disease, unspecified: Secondary | ICD-10-CM | POA: Diagnosis not present

## 2020-01-08 DIAGNOSIS — I5032 Chronic diastolic (congestive) heart failure: Secondary | ICD-10-CM | POA: Diagnosis not present

## 2020-01-12 DIAGNOSIS — Z79891 Long term (current) use of opiate analgesic: Secondary | ICD-10-CM | POA: Diagnosis not present

## 2020-01-12 DIAGNOSIS — I5020 Unspecified systolic (congestive) heart failure: Secondary | ICD-10-CM | POA: Diagnosis not present

## 2020-01-12 DIAGNOSIS — I480 Paroxysmal atrial fibrillation: Secondary | ICD-10-CM | POA: Diagnosis not present

## 2020-01-12 DIAGNOSIS — J189 Pneumonia, unspecified organism: Secondary | ICD-10-CM | POA: Diagnosis not present

## 2020-01-12 DIAGNOSIS — Z7982 Long term (current) use of aspirin: Secondary | ICD-10-CM | POA: Diagnosis not present

## 2020-01-12 DIAGNOSIS — Z85528 Personal history of other malignant neoplasm of kidney: Secondary | ICD-10-CM | POA: Diagnosis not present

## 2020-01-12 DIAGNOSIS — M109 Gout, unspecified: Secondary | ICD-10-CM | POA: Diagnosis not present

## 2020-01-12 DIAGNOSIS — Z905 Acquired absence of kidney: Secondary | ICD-10-CM | POA: Diagnosis not present

## 2020-01-12 DIAGNOSIS — Z7902 Long term (current) use of antithrombotics/antiplatelets: Secondary | ICD-10-CM | POA: Diagnosis not present

## 2020-01-12 DIAGNOSIS — Z950 Presence of cardiac pacemaker: Secondary | ICD-10-CM | POA: Diagnosis not present

## 2020-01-12 DIAGNOSIS — N189 Chronic kidney disease, unspecified: Secondary | ICD-10-CM | POA: Diagnosis not present

## 2020-01-12 DIAGNOSIS — I2510 Atherosclerotic heart disease of native coronary artery without angina pectoris: Secondary | ICD-10-CM | POA: Diagnosis not present

## 2020-01-12 DIAGNOSIS — K219 Gastro-esophageal reflux disease without esophagitis: Secondary | ICD-10-CM | POA: Diagnosis not present

## 2020-01-12 DIAGNOSIS — I130 Hypertensive heart and chronic kidney disease with heart failure and stage 1 through stage 4 chronic kidney disease, or unspecified chronic kidney disease: Secondary | ICD-10-CM | POA: Diagnosis not present

## 2020-01-12 DIAGNOSIS — R0902 Hypoxemia: Secondary | ICD-10-CM | POA: Diagnosis not present

## 2020-01-12 DIAGNOSIS — Z87891 Personal history of nicotine dependence: Secondary | ICD-10-CM | POA: Diagnosis not present

## 2020-01-12 DIAGNOSIS — G8929 Other chronic pain: Secondary | ICD-10-CM | POA: Diagnosis not present

## 2020-01-12 DIAGNOSIS — Z8673 Personal history of transient ischemic attack (TIA), and cerebral infarction without residual deficits: Secondary | ICD-10-CM | POA: Diagnosis not present

## 2020-01-12 DIAGNOSIS — F329 Major depressive disorder, single episode, unspecified: Secondary | ICD-10-CM | POA: Diagnosis not present

## 2020-01-12 DIAGNOSIS — N179 Acute kidney failure, unspecified: Secondary | ICD-10-CM | POA: Diagnosis not present

## 2020-01-12 DIAGNOSIS — R5381 Other malaise: Secondary | ICD-10-CM | POA: Diagnosis not present

## 2020-01-12 DIAGNOSIS — E7800 Pure hypercholesterolemia, unspecified: Secondary | ICD-10-CM | POA: Diagnosis not present

## 2020-01-17 DIAGNOSIS — N179 Acute kidney failure, unspecified: Secondary | ICD-10-CM | POA: Diagnosis not present

## 2020-01-17 DIAGNOSIS — R5381 Other malaise: Secondary | ICD-10-CM | POA: Diagnosis not present

## 2020-01-17 DIAGNOSIS — R0902 Hypoxemia: Secondary | ICD-10-CM | POA: Diagnosis not present

## 2020-01-17 DIAGNOSIS — N189 Chronic kidney disease, unspecified: Secondary | ICD-10-CM | POA: Diagnosis not present

## 2020-01-18 ENCOUNTER — Telehealth: Payer: Self-pay

## 2020-01-18 DIAGNOSIS — F1120 Opioid dependence, uncomplicated: Secondary | ICD-10-CM | POA: Diagnosis not present

## 2020-01-18 DIAGNOSIS — C641 Malignant neoplasm of right kidney, except renal pelvis: Secondary | ICD-10-CM | POA: Diagnosis not present

## 2020-01-18 DIAGNOSIS — I69354 Hemiplegia and hemiparesis following cerebral infarction affecting left non-dominant side: Secondary | ICD-10-CM | POA: Diagnosis not present

## 2020-01-18 DIAGNOSIS — I480 Paroxysmal atrial fibrillation: Secondary | ICD-10-CM | POA: Diagnosis not present

## 2020-01-18 DIAGNOSIS — J189 Pneumonia, unspecified organism: Secondary | ICD-10-CM | POA: Diagnosis not present

## 2020-01-18 DIAGNOSIS — I5030 Unspecified diastolic (congestive) heart failure: Secondary | ICD-10-CM | POA: Diagnosis not present

## 2020-01-18 DIAGNOSIS — I110 Hypertensive heart disease with heart failure: Secondary | ICD-10-CM | POA: Diagnosis not present

## 2020-01-18 DIAGNOSIS — Z905 Acquired absence of kidney: Secondary | ICD-10-CM | POA: Diagnosis not present

## 2020-01-18 DIAGNOSIS — R5381 Other malaise: Secondary | ICD-10-CM | POA: Diagnosis not present

## 2020-01-18 DIAGNOSIS — N179 Acute kidney failure, unspecified: Secondary | ICD-10-CM | POA: Diagnosis not present

## 2020-01-18 NOTE — Telephone Encounter (Signed)
Left message for patient to inform of disconnected monitor. 

## 2020-01-20 DIAGNOSIS — J189 Pneumonia, unspecified organism: Secondary | ICD-10-CM | POA: Diagnosis not present

## 2020-01-20 DIAGNOSIS — R5381 Other malaise: Secondary | ICD-10-CM | POA: Diagnosis not present

## 2020-01-23 DIAGNOSIS — Z8701 Personal history of pneumonia (recurrent): Secondary | ICD-10-CM | POA: Diagnosis not present

## 2020-01-23 DIAGNOSIS — J189 Pneumonia, unspecified organism: Secondary | ICD-10-CM | POA: Diagnosis not present

## 2020-01-31 DIAGNOSIS — E860 Dehydration: Secondary | ICD-10-CM | POA: Diagnosis not present

## 2020-02-06 DIAGNOSIS — M47816 Spondylosis without myelopathy or radiculopathy, lumbar region: Secondary | ICD-10-CM | POA: Diagnosis not present

## 2020-02-06 DIAGNOSIS — G603 Idiopathic progressive neuropathy: Secondary | ICD-10-CM | POA: Diagnosis not present

## 2020-02-06 DIAGNOSIS — G894 Chronic pain syndrome: Secondary | ICD-10-CM | POA: Diagnosis not present

## 2020-02-06 DIAGNOSIS — Z79891 Long term (current) use of opiate analgesic: Secondary | ICD-10-CM | POA: Diagnosis not present

## 2020-02-08 DIAGNOSIS — S36892A Contusion of other intra-abdominal organs, initial encounter: Secondary | ICD-10-CM | POA: Diagnosis not present

## 2020-02-14 DIAGNOSIS — G894 Chronic pain syndrome: Secondary | ICD-10-CM | POA: Diagnosis not present

## 2020-02-16 DIAGNOSIS — R2689 Other abnormalities of gait and mobility: Secondary | ICD-10-CM | POA: Diagnosis not present

## 2020-02-16 DIAGNOSIS — R531 Weakness: Secondary | ICD-10-CM | POA: Diagnosis not present

## 2020-02-16 DIAGNOSIS — R5381 Other malaise: Secondary | ICD-10-CM | POA: Diagnosis not present

## 2020-02-25 DIAGNOSIS — Z8673 Personal history of transient ischemic attack (TIA), and cerebral infarction without residual deficits: Secondary | ICD-10-CM | POA: Diagnosis not present

## 2020-02-25 DIAGNOSIS — Z87891 Personal history of nicotine dependence: Secondary | ICD-10-CM | POA: Diagnosis not present

## 2020-02-25 DIAGNOSIS — Z905 Acquired absence of kidney: Secondary | ICD-10-CM | POA: Diagnosis not present

## 2020-02-25 DIAGNOSIS — M25512 Pain in left shoulder: Secondary | ICD-10-CM | POA: Diagnosis not present

## 2020-02-25 DIAGNOSIS — R519 Headache, unspecified: Secondary | ICD-10-CM | POA: Diagnosis not present

## 2020-02-25 DIAGNOSIS — Z7902 Long term (current) use of antithrombotics/antiplatelets: Secondary | ICD-10-CM | POA: Diagnosis not present

## 2020-02-25 DIAGNOSIS — S4992XA Unspecified injury of left shoulder and upper arm, initial encounter: Secondary | ICD-10-CM | POA: Diagnosis not present

## 2020-02-25 DIAGNOSIS — S14109A Unspecified injury at unspecified level of cervical spinal cord, initial encounter: Secondary | ICD-10-CM | POA: Diagnosis not present

## 2020-02-25 DIAGNOSIS — Z882 Allergy status to sulfonamides status: Secondary | ICD-10-CM | POA: Diagnosis not present

## 2020-02-25 DIAGNOSIS — I252 Old myocardial infarction: Secondary | ICD-10-CM | POA: Diagnosis not present

## 2020-02-25 DIAGNOSIS — M25422 Effusion, left elbow: Secondary | ICD-10-CM | POA: Diagnosis not present

## 2020-02-25 DIAGNOSIS — I10 Essential (primary) hypertension: Secondary | ICD-10-CM | POA: Diagnosis not present

## 2020-02-25 DIAGNOSIS — D649 Anemia, unspecified: Secondary | ICD-10-CM | POA: Diagnosis not present

## 2020-02-25 DIAGNOSIS — S3991XA Unspecified injury of abdomen, initial encounter: Secondary | ICD-10-CM | POA: Diagnosis not present

## 2020-02-25 DIAGNOSIS — E7800 Pure hypercholesterolemia, unspecified: Secondary | ICD-10-CM | POA: Diagnosis not present

## 2020-02-25 DIAGNOSIS — S20223A Contusion of bilateral back wall of thorax, initial encounter: Secondary | ICD-10-CM | POA: Diagnosis not present

## 2020-02-25 DIAGNOSIS — M25529 Pain in unspecified elbow: Secondary | ICD-10-CM | POA: Diagnosis not present

## 2020-02-25 DIAGNOSIS — M25551 Pain in right hip: Secondary | ICD-10-CM | POA: Diagnosis not present

## 2020-02-25 DIAGNOSIS — M25552 Pain in left hip: Secondary | ICD-10-CM | POA: Diagnosis not present

## 2020-02-25 DIAGNOSIS — M545 Low back pain: Secondary | ICD-10-CM | POA: Diagnosis not present

## 2020-02-25 DIAGNOSIS — S299XXA Unspecified injury of thorax, initial encounter: Secondary | ICD-10-CM | POA: Diagnosis not present

## 2020-02-25 DIAGNOSIS — Z7982 Long term (current) use of aspirin: Secondary | ICD-10-CM | POA: Diagnosis not present

## 2020-02-25 DIAGNOSIS — G8929 Other chronic pain: Secondary | ICD-10-CM | POA: Diagnosis not present

## 2020-02-25 DIAGNOSIS — Z79899 Other long term (current) drug therapy: Secondary | ICD-10-CM | POA: Diagnosis not present

## 2020-02-28 DIAGNOSIS — D649 Anemia, unspecified: Secondary | ICD-10-CM | POA: Diagnosis not present

## 2020-02-28 DIAGNOSIS — W19XXXD Unspecified fall, subsequent encounter: Secondary | ICD-10-CM | POA: Diagnosis not present

## 2020-03-10 DIAGNOSIS — S36892A Contusion of other intra-abdominal organs, initial encounter: Secondary | ICD-10-CM | POA: Diagnosis not present

## 2020-03-19 DIAGNOSIS — Z87891 Personal history of nicotine dependence: Secondary | ICD-10-CM | POA: Diagnosis not present

## 2020-03-19 DIAGNOSIS — R197 Diarrhea, unspecified: Secondary | ICD-10-CM | POA: Diagnosis not present

## 2020-03-19 DIAGNOSIS — S0990XA Unspecified injury of head, initial encounter: Secondary | ICD-10-CM | POA: Diagnosis not present

## 2020-03-19 DIAGNOSIS — Z882 Allergy status to sulfonamides status: Secondary | ICD-10-CM | POA: Diagnosis not present

## 2020-03-19 DIAGNOSIS — I480 Paroxysmal atrial fibrillation: Secondary | ICD-10-CM | POA: Diagnosis not present

## 2020-03-19 DIAGNOSIS — Z7902 Long term (current) use of antithrombotics/antiplatelets: Secondary | ICD-10-CM | POA: Diagnosis not present

## 2020-03-19 DIAGNOSIS — Z20822 Contact with and (suspected) exposure to covid-19: Secondary | ICD-10-CM | POA: Diagnosis not present

## 2020-03-19 DIAGNOSIS — I2510 Atherosclerotic heart disease of native coronary artery without angina pectoris: Secondary | ICD-10-CM | POA: Diagnosis not present

## 2020-03-19 DIAGNOSIS — R52 Pain, unspecified: Secondary | ICD-10-CM | POA: Diagnosis not present

## 2020-03-19 DIAGNOSIS — S2242XA Multiple fractures of ribs, left side, initial encounter for closed fracture: Secondary | ICD-10-CM | POA: Diagnosis not present

## 2020-03-19 DIAGNOSIS — I700 Atherosclerosis of aorta: Secondary | ICD-10-CM | POA: Diagnosis not present

## 2020-03-19 DIAGNOSIS — S2249XA Multiple fractures of ribs, unspecified side, initial encounter for closed fracture: Secondary | ICD-10-CM | POA: Diagnosis not present

## 2020-03-19 DIAGNOSIS — J90 Pleural effusion, not elsewhere classified: Secondary | ICD-10-CM | POA: Diagnosis not present

## 2020-03-19 DIAGNOSIS — W19XXXA Unspecified fall, initial encounter: Secondary | ICD-10-CM | POA: Diagnosis not present

## 2020-03-19 DIAGNOSIS — Z885 Allergy status to narcotic agent status: Secondary | ICD-10-CM | POA: Diagnosis not present

## 2020-03-19 DIAGNOSIS — I10 Essential (primary) hypertension: Secondary | ICD-10-CM | POA: Diagnosis not present

## 2020-03-19 DIAGNOSIS — S2231XA Fracture of one rib, right side, initial encounter for closed fracture: Secondary | ICD-10-CM | POA: Diagnosis not present

## 2020-03-19 DIAGNOSIS — E7800 Pure hypercholesterolemia, unspecified: Secondary | ICD-10-CM | POA: Diagnosis not present

## 2020-03-19 DIAGNOSIS — Z7982 Long term (current) use of aspirin: Secondary | ICD-10-CM | POA: Diagnosis not present

## 2020-03-19 DIAGNOSIS — Z79899 Other long term (current) drug therapy: Secondary | ICD-10-CM | POA: Diagnosis not present

## 2020-03-19 DIAGNOSIS — Z8673 Personal history of transient ischemic attack (TIA), and cerebral infarction without residual deficits: Secondary | ICD-10-CM | POA: Diagnosis not present

## 2020-03-19 DIAGNOSIS — E871 Hypo-osmolality and hyponatremia: Secondary | ICD-10-CM | POA: Diagnosis not present

## 2020-03-19 DIAGNOSIS — S199XXA Unspecified injury of neck, initial encounter: Secondary | ICD-10-CM | POA: Diagnosis not present

## 2020-03-19 DIAGNOSIS — G8929 Other chronic pain: Secondary | ICD-10-CM | POA: Diagnosis not present

## 2020-03-19 DIAGNOSIS — N281 Cyst of kidney, acquired: Secondary | ICD-10-CM | POA: Diagnosis not present

## 2020-03-19 DIAGNOSIS — R410 Disorientation, unspecified: Secondary | ICD-10-CM | POA: Diagnosis not present

## 2020-03-19 DIAGNOSIS — E876 Hypokalemia: Secondary | ICD-10-CM | POA: Diagnosis not present

## 2020-03-19 DIAGNOSIS — I252 Old myocardial infarction: Secondary | ICD-10-CM | POA: Diagnosis not present

## 2020-03-19 DIAGNOSIS — M5489 Other dorsalgia: Secondary | ICD-10-CM | POA: Diagnosis not present

## 2020-03-20 DIAGNOSIS — Z7902 Long term (current) use of antithrombotics/antiplatelets: Secondary | ICD-10-CM | POA: Diagnosis not present

## 2020-03-20 DIAGNOSIS — N281 Cyst of kidney, acquired: Secondary | ICD-10-CM | POA: Diagnosis not present

## 2020-03-20 DIAGNOSIS — Z9911 Dependence on respirator [ventilator] status: Secondary | ICD-10-CM | POA: Diagnosis not present

## 2020-03-20 DIAGNOSIS — Z452 Encounter for adjustment and management of vascular access device: Secondary | ICD-10-CM | POA: Diagnosis not present

## 2020-03-20 DIAGNOSIS — K828 Other specified diseases of gallbladder: Secondary | ICD-10-CM | POA: Diagnosis not present

## 2020-03-20 DIAGNOSIS — J939 Pneumothorax, unspecified: Secondary | ICD-10-CM | POA: Diagnosis not present

## 2020-03-20 DIAGNOSIS — S32029A Unspecified fracture of second lumbar vertebra, initial encounter for closed fracture: Secondary | ICD-10-CM | POA: Diagnosis not present

## 2020-03-20 DIAGNOSIS — T1490XA Injury, unspecified, initial encounter: Secondary | ICD-10-CM | POA: Diagnosis not present

## 2020-03-20 DIAGNOSIS — J9601 Acute respiratory failure with hypoxia: Secondary | ICD-10-CM | POA: Diagnosis not present

## 2020-03-20 DIAGNOSIS — J942 Hemothorax: Secondary | ICD-10-CM | POA: Diagnosis not present

## 2020-03-20 DIAGNOSIS — Z96641 Presence of right artificial hip joint: Secondary | ICD-10-CM | POA: Diagnosis not present

## 2020-03-20 DIAGNOSIS — R0689 Other abnormalities of breathing: Secondary | ICD-10-CM | POA: Diagnosis not present

## 2020-03-20 DIAGNOSIS — W19XXXA Unspecified fall, initial encounter: Secondary | ICD-10-CM | POA: Diagnosis not present

## 2020-03-20 DIAGNOSIS — S299XXD Unspecified injury of thorax, subsequent encounter: Secondary | ICD-10-CM | POA: Diagnosis not present

## 2020-03-20 DIAGNOSIS — J869 Pyothorax without fistula: Secondary | ICD-10-CM | POA: Diagnosis not present

## 2020-03-20 DIAGNOSIS — R160 Hepatomegaly, not elsewhere classified: Secondary | ICD-10-CM | POA: Diagnosis not present

## 2020-03-20 DIAGNOSIS — Z882 Allergy status to sulfonamides status: Secondary | ICD-10-CM | POA: Diagnosis not present

## 2020-03-20 DIAGNOSIS — S225XXA Flail chest, initial encounter for closed fracture: Secondary | ICD-10-CM | POA: Diagnosis not present

## 2020-03-20 DIAGNOSIS — R0989 Other specified symptoms and signs involving the circulatory and respiratory systems: Secondary | ICD-10-CM | POA: Diagnosis not present

## 2020-03-20 DIAGNOSIS — S271XXA Traumatic hemothorax, initial encounter: Secondary | ICD-10-CM | POA: Diagnosis not present

## 2020-03-20 DIAGNOSIS — Z515 Encounter for palliative care: Secondary | ICD-10-CM | POA: Diagnosis not present

## 2020-03-20 DIAGNOSIS — J90 Pleural effusion, not elsewhere classified: Secondary | ICD-10-CM | POA: Diagnosis not present

## 2020-03-20 DIAGNOSIS — I959 Hypotension, unspecified: Secondary | ICD-10-CM | POA: Diagnosis not present

## 2020-03-20 DIAGNOSIS — R7401 Elevation of levels of liver transaminase levels: Secondary | ICD-10-CM | POA: Diagnosis not present

## 2020-03-20 DIAGNOSIS — R10819 Abdominal tenderness, unspecified site: Secondary | ICD-10-CM | POA: Diagnosis not present

## 2020-03-20 DIAGNOSIS — I7090 Unspecified atherosclerosis: Secondary | ICD-10-CM | POA: Diagnosis not present

## 2020-03-20 DIAGNOSIS — Z85528 Personal history of other malignant neoplasm of kidney: Secondary | ICD-10-CM | POA: Diagnosis not present

## 2020-03-20 DIAGNOSIS — D62 Acute posthemorrhagic anemia: Secondary | ICD-10-CM | POA: Diagnosis not present

## 2020-03-20 DIAGNOSIS — Z743 Need for continuous supervision: Secondary | ICD-10-CM | POA: Diagnosis not present

## 2020-03-20 DIAGNOSIS — S2242XA Multiple fractures of ribs, left side, initial encounter for closed fracture: Secondary | ICD-10-CM | POA: Diagnosis not present

## 2020-03-20 DIAGNOSIS — S32059A Unspecified fracture of fifth lumbar vertebra, initial encounter for closed fracture: Secondary | ICD-10-CM | POA: Diagnosis not present

## 2020-03-20 DIAGNOSIS — R1032 Left lower quadrant pain: Secondary | ICD-10-CM | POA: Diagnosis not present

## 2020-03-20 DIAGNOSIS — S32019A Unspecified fracture of first lumbar vertebra, initial encounter for closed fracture: Secondary | ICD-10-CM | POA: Diagnosis not present

## 2020-03-20 DIAGNOSIS — S36892A Contusion of other intra-abdominal organs, initial encounter: Secondary | ICD-10-CM | POA: Diagnosis not present

## 2020-03-20 DIAGNOSIS — Z905 Acquired absence of kidney: Secondary | ICD-10-CM | POA: Diagnosis not present

## 2020-03-20 DIAGNOSIS — E871 Hypo-osmolality and hyponatremia: Secondary | ICD-10-CM | POA: Diagnosis not present

## 2020-03-20 DIAGNOSIS — R079 Chest pain, unspecified: Secondary | ICD-10-CM | POA: Diagnosis not present

## 2020-03-20 DIAGNOSIS — R070 Pain in throat: Secondary | ICD-10-CM | POA: Diagnosis not present

## 2020-03-20 DIAGNOSIS — J9690 Respiratory failure, unspecified, unspecified whether with hypoxia or hypercapnia: Secondary | ICD-10-CM | POA: Diagnosis not present

## 2020-03-20 DIAGNOSIS — I499 Cardiac arrhythmia, unspecified: Secondary | ICD-10-CM | POA: Diagnosis not present

## 2020-03-20 DIAGNOSIS — G8918 Other acute postprocedural pain: Secondary | ICD-10-CM | POA: Diagnosis not present

## 2020-03-20 DIAGNOSIS — M9963 Osseous and subluxation stenosis of intervertebral foramina of lumbar region: Secondary | ICD-10-CM | POA: Diagnosis not present

## 2020-03-20 DIAGNOSIS — J189 Pneumonia, unspecified organism: Secondary | ICD-10-CM | POA: Diagnosis not present

## 2020-03-20 DIAGNOSIS — R918 Other nonspecific abnormal finding of lung field: Secondary | ICD-10-CM | POA: Diagnosis not present

## 2020-03-20 DIAGNOSIS — R188 Other ascites: Secondary | ICD-10-CM | POA: Diagnosis not present

## 2020-03-20 DIAGNOSIS — K4020 Bilateral inguinal hernia, without obstruction or gangrene, not specified as recurrent: Secondary | ICD-10-CM | POA: Diagnosis not present

## 2020-03-20 DIAGNOSIS — M5489 Other dorsalgia: Secondary | ICD-10-CM | POA: Diagnosis not present

## 2020-03-20 DIAGNOSIS — R42 Dizziness and giddiness: Secondary | ICD-10-CM | POA: Diagnosis not present

## 2020-03-20 DIAGNOSIS — J9602 Acute respiratory failure with hypercapnia: Secondary | ICD-10-CM | POA: Diagnosis not present

## 2020-03-20 DIAGNOSIS — K6389 Other specified diseases of intestine: Secondary | ICD-10-CM | POA: Diagnosis not present

## 2020-03-20 DIAGNOSIS — Z4682 Encounter for fitting and adjustment of non-vascular catheter: Secondary | ICD-10-CM | POA: Diagnosis not present

## 2020-03-20 DIAGNOSIS — M19071 Primary osteoarthritis, right ankle and foot: Secondary | ICD-10-CM | POA: Diagnosis not present

## 2020-03-20 DIAGNOSIS — M47812 Spondylosis without myelopathy or radiculopathy, cervical region: Secondary | ICD-10-CM | POA: Diagnosis not present

## 2020-03-20 DIAGNOSIS — S2232XA Fracture of one rib, left side, initial encounter for closed fracture: Secondary | ICD-10-CM | POA: Diagnosis not present

## 2020-03-20 DIAGNOSIS — J9811 Atelectasis: Secondary | ICD-10-CM | POA: Diagnosis not present

## 2020-03-20 DIAGNOSIS — Z7982 Long term (current) use of aspirin: Secondary | ICD-10-CM | POA: Diagnosis not present

## 2020-03-20 DIAGNOSIS — R609 Edema, unspecified: Secondary | ICD-10-CM | POA: Diagnosis not present

## 2020-03-20 DIAGNOSIS — S199XXA Unspecified injury of neck, initial encounter: Secondary | ICD-10-CM | POA: Diagnosis not present

## 2020-03-20 DIAGNOSIS — R0600 Dyspnea, unspecified: Secondary | ICD-10-CM | POA: Diagnosis not present

## 2020-03-20 DIAGNOSIS — R0602 Shortness of breath: Secondary | ICD-10-CM | POA: Diagnosis not present

## 2020-03-20 DIAGNOSIS — J9600 Acute respiratory failure, unspecified whether with hypoxia or hypercapnia: Secondary | ICD-10-CM | POA: Diagnosis not present

## 2020-03-20 DIAGNOSIS — S299XXA Unspecified injury of thorax, initial encounter: Secondary | ICD-10-CM | POA: Diagnosis not present

## 2020-03-20 DIAGNOSIS — M79641 Pain in right hand: Secondary | ICD-10-CM | POA: Diagnosis not present

## 2020-03-20 DIAGNOSIS — E2740 Unspecified adrenocortical insufficiency: Secondary | ICD-10-CM | POA: Diagnosis not present

## 2020-03-20 DIAGNOSIS — M9933 Osseous stenosis of neural canal of lumbar region: Secondary | ICD-10-CM | POA: Diagnosis not present

## 2020-03-20 DIAGNOSIS — M7989 Other specified soft tissue disorders: Secondary | ICD-10-CM | POA: Diagnosis not present

## 2020-03-20 DIAGNOSIS — R7989 Other specified abnormal findings of blood chemistry: Secondary | ICD-10-CM | POA: Diagnosis not present

## 2020-03-20 DIAGNOSIS — S3993XA Unspecified injury of pelvis, initial encounter: Secondary | ICD-10-CM | POA: Diagnosis not present

## 2020-03-20 DIAGNOSIS — R4182 Altered mental status, unspecified: Secondary | ICD-10-CM | POA: Diagnosis not present

## 2020-03-20 DIAGNOSIS — S37019A Minor contusion of unspecified kidney, initial encounter: Secondary | ICD-10-CM | POA: Diagnosis not present

## 2020-03-20 DIAGNOSIS — R590 Localized enlarged lymph nodes: Secondary | ICD-10-CM | POA: Diagnosis not present

## 2020-03-20 DIAGNOSIS — G8929 Other chronic pain: Secondary | ICD-10-CM | POA: Diagnosis not present

## 2020-03-20 DIAGNOSIS — S32049A Unspecified fracture of fourth lumbar vertebra, initial encounter for closed fracture: Secondary | ICD-10-CM | POA: Diagnosis not present

## 2020-03-20 DIAGNOSIS — R58 Hemorrhage, not elsewhere classified: Secondary | ICD-10-CM | POA: Diagnosis not present

## 2020-03-20 DIAGNOSIS — R410 Disorientation, unspecified: Secondary | ICD-10-CM | POA: Diagnosis not present

## 2020-03-20 DIAGNOSIS — L03115 Cellulitis of right lower limb: Secondary | ICD-10-CM | POA: Diagnosis not present

## 2020-03-20 DIAGNOSIS — N181 Chronic kidney disease, stage 1: Secondary | ICD-10-CM | POA: Diagnosis not present

## 2020-03-20 DIAGNOSIS — J948 Other specified pleural conditions: Secondary | ICD-10-CM | POA: Diagnosis not present

## 2020-03-20 DIAGNOSIS — E8770 Fluid overload, unspecified: Secondary | ICD-10-CM | POA: Diagnosis not present

## 2020-03-20 DIAGNOSIS — E875 Hyperkalemia: Secondary | ICD-10-CM | POA: Diagnosis not present

## 2020-03-20 DIAGNOSIS — S0990XA Unspecified injury of head, initial encounter: Secondary | ICD-10-CM | POA: Diagnosis not present

## 2020-03-20 DIAGNOSIS — F05 Delirium due to known physiological condition: Secondary | ICD-10-CM | POA: Diagnosis not present

## 2020-03-20 DIAGNOSIS — G9340 Encephalopathy, unspecified: Secondary | ICD-10-CM | POA: Diagnosis not present

## 2020-03-20 DIAGNOSIS — R945 Abnormal results of liver function studies: Secondary | ICD-10-CM | POA: Diagnosis not present

## 2020-03-20 DIAGNOSIS — E878 Other disorders of electrolyte and fluid balance, not elsewhere classified: Secondary | ICD-10-CM | POA: Diagnosis not present

## 2020-03-20 DIAGNOSIS — Z8673 Personal history of transient ischemic attack (TIA), and cerebral infarction without residual deficits: Secondary | ICD-10-CM | POA: Diagnosis not present

## 2020-03-20 DIAGNOSIS — N179 Acute kidney failure, unspecified: Secondary | ICD-10-CM | POA: Diagnosis not present

## 2020-03-20 DIAGNOSIS — F1490 Cocaine use, unspecified, uncomplicated: Secondary | ICD-10-CM | POA: Diagnosis not present

## 2020-03-20 DIAGNOSIS — J15212 Pneumonia due to Methicillin resistant Staphylococcus aureus: Secondary | ICD-10-CM | POA: Diagnosis not present

## 2020-03-20 DIAGNOSIS — K5900 Constipation, unspecified: Secondary | ICD-10-CM | POA: Diagnosis not present

## 2020-03-20 DIAGNOSIS — R109 Unspecified abdominal pain: Secondary | ICD-10-CM | POA: Diagnosis not present

## 2020-03-20 DIAGNOSIS — R6511 Systemic inflammatory response syndrome (SIRS) of non-infectious origin with acute organ dysfunction: Secondary | ICD-10-CM | POA: Diagnosis not present

## 2020-03-20 DIAGNOSIS — M5126 Other intervertebral disc displacement, lumbar region: Secondary | ICD-10-CM | POA: Diagnosis not present

## 2020-03-20 DIAGNOSIS — J811 Chronic pulmonary edema: Secondary | ICD-10-CM | POA: Diagnosis not present

## 2020-03-20 DIAGNOSIS — R0603 Acute respiratory distress: Secondary | ICD-10-CM | POA: Diagnosis not present

## 2020-03-20 DIAGNOSIS — R0902 Hypoxemia: Secondary | ICD-10-CM | POA: Diagnosis not present

## 2020-03-20 DIAGNOSIS — K560 Paralytic ileus: Secondary | ICD-10-CM | POA: Diagnosis not present

## 2020-03-20 DIAGNOSIS — M5030 Other cervical disc degeneration, unspecified cervical region: Secondary | ICD-10-CM | POA: Diagnosis not present

## 2020-03-20 DIAGNOSIS — S270XXA Traumatic pneumothorax, initial encounter: Secondary | ICD-10-CM | POA: Diagnosis not present

## 2020-03-20 DIAGNOSIS — Z96612 Presence of left artificial shoulder joint: Secondary | ICD-10-CM | POA: Diagnosis not present

## 2020-03-20 DIAGNOSIS — R0682 Tachypnea, not elsewhere classified: Secondary | ICD-10-CM | POA: Diagnosis not present

## 2020-04-18 DIAGNOSIS — I69391 Dysphagia following cerebral infarction: Secondary | ICD-10-CM | POA: Diagnosis not present

## 2020-04-18 DIAGNOSIS — J9600 Acute respiratory failure, unspecified whether with hypoxia or hypercapnia: Secondary | ICD-10-CM | POA: Diagnosis not present

## 2020-04-18 DIAGNOSIS — G894 Chronic pain syndrome: Secondary | ICD-10-CM | POA: Diagnosis not present

## 2020-04-18 DIAGNOSIS — Z9911 Dependence on respirator [ventilator] status: Secondary | ICD-10-CM | POA: Diagnosis not present

## 2020-04-18 DIAGNOSIS — M109 Gout, unspecified: Secondary | ICD-10-CM | POA: Diagnosis not present

## 2020-04-18 DIAGNOSIS — I2510 Atherosclerotic heart disease of native coronary artery without angina pectoris: Secondary | ICD-10-CM | POA: Diagnosis not present

## 2020-04-18 DIAGNOSIS — S2242XD Multiple fractures of ribs, left side, subsequent encounter for fracture with routine healing: Secondary | ICD-10-CM | POA: Diagnosis not present

## 2020-04-18 DIAGNOSIS — R5381 Other malaise: Secondary | ICD-10-CM | POA: Diagnosis not present

## 2020-04-18 DIAGNOSIS — M2550 Pain in unspecified joint: Secondary | ICD-10-CM | POA: Diagnosis not present

## 2020-04-18 DIAGNOSIS — Z430 Encounter for attention to tracheostomy: Secondary | ICD-10-CM | POA: Diagnosis not present

## 2020-04-18 DIAGNOSIS — S32019D Unspecified fracture of first lumbar vertebra, subsequent encounter for fracture with routine healing: Secondary | ICD-10-CM | POA: Diagnosis not present

## 2020-04-18 DIAGNOSIS — M24572 Contracture, left ankle: Secondary | ICD-10-CM | POA: Diagnosis not present

## 2020-04-18 DIAGNOSIS — G7281 Critical illness myopathy: Secondary | ICD-10-CM | POA: Diagnosis not present

## 2020-04-18 DIAGNOSIS — R140 Abdominal distension (gaseous): Secondary | ICD-10-CM | POA: Diagnosis not present

## 2020-04-18 DIAGNOSIS — E870 Hyperosmolality and hypernatremia: Secondary | ICD-10-CM | POA: Diagnosis not present

## 2020-04-18 DIAGNOSIS — S2242XA Multiple fractures of ribs, left side, initial encounter for closed fracture: Secondary | ICD-10-CM | POA: Diagnosis not present

## 2020-04-18 DIAGNOSIS — I679 Cerebrovascular disease, unspecified: Secondary | ICD-10-CM | POA: Diagnosis not present

## 2020-04-18 DIAGNOSIS — I6789 Other cerebrovascular disease: Secondary | ICD-10-CM | POA: Diagnosis not present

## 2020-04-18 DIAGNOSIS — Z931 Gastrostomy status: Secondary | ICD-10-CM | POA: Diagnosis not present

## 2020-04-18 DIAGNOSIS — G8929 Other chronic pain: Secondary | ICD-10-CM | POA: Diagnosis not present

## 2020-04-18 DIAGNOSIS — Z431 Encounter for attention to gastrostomy: Secondary | ICD-10-CM | POA: Diagnosis not present

## 2020-04-18 DIAGNOSIS — R918 Other nonspecific abnormal finding of lung field: Secondary | ICD-10-CM | POA: Diagnosis not present

## 2020-04-18 DIAGNOSIS — K9422 Gastrostomy infection: Secondary | ICD-10-CM | POA: Diagnosis not present

## 2020-04-18 DIAGNOSIS — R1311 Dysphagia, oral phase: Secondary | ICD-10-CM | POA: Diagnosis not present

## 2020-04-18 DIAGNOSIS — Z452 Encounter for adjustment and management of vascular access device: Secondary | ICD-10-CM | POA: Diagnosis not present

## 2020-04-18 DIAGNOSIS — M24571 Contracture, right ankle: Secondary | ICD-10-CM | POA: Diagnosis not present

## 2020-04-18 DIAGNOSIS — Z7401 Bed confinement status: Secondary | ICD-10-CM | POA: Diagnosis not present

## 2020-04-18 DIAGNOSIS — F0631 Mood disorder due to known physiological condition with depressive features: Secondary | ICD-10-CM | POA: Diagnosis not present

## 2020-04-18 DIAGNOSIS — S32029D Unspecified fracture of second lumbar vertebra, subsequent encounter for fracture with routine healing: Secondary | ICD-10-CM | POA: Diagnosis not present

## 2020-04-18 DIAGNOSIS — S2242XG Multiple fractures of ribs, left side, subsequent encounter for fracture with delayed healing: Secondary | ICD-10-CM | POA: Diagnosis not present

## 2020-04-18 DIAGNOSIS — R269 Unspecified abnormalities of gait and mobility: Secondary | ICD-10-CM | POA: Diagnosis not present

## 2020-04-18 DIAGNOSIS — N183 Chronic kidney disease, stage 3 unspecified: Secondary | ICD-10-CM | POA: Diagnosis not present

## 2020-04-18 DIAGNOSIS — F411 Generalized anxiety disorder: Secondary | ICD-10-CM | POA: Diagnosis not present

## 2020-04-18 DIAGNOSIS — G609 Hereditary and idiopathic neuropathy, unspecified: Secondary | ICD-10-CM | POA: Diagnosis not present

## 2020-04-18 DIAGNOSIS — N182 Chronic kidney disease, stage 2 (mild): Secondary | ICD-10-CM | POA: Diagnosis not present

## 2020-04-18 DIAGNOSIS — J9601 Acute respiratory failure with hypoxia: Secondary | ICD-10-CM | POA: Diagnosis not present

## 2020-04-18 DIAGNOSIS — Z9989 Dependence on other enabling machines and devices: Secondary | ICD-10-CM | POA: Diagnosis not present

## 2020-04-18 DIAGNOSIS — M24531 Contracture, right wrist: Secondary | ICD-10-CM | POA: Diagnosis not present

## 2020-04-18 DIAGNOSIS — R1312 Dysphagia, oropharyngeal phase: Secondary | ICD-10-CM | POA: Diagnosis not present

## 2020-04-18 DIAGNOSIS — I69398 Other sequelae of cerebral infarction: Secondary | ICD-10-CM | POA: Diagnosis not present

## 2020-04-18 DIAGNOSIS — Z85528 Personal history of other malignant neoplasm of kidney: Secondary | ICD-10-CM | POA: Diagnosis not present

## 2020-04-18 DIAGNOSIS — E669 Obesity, unspecified: Secondary | ICD-10-CM | POA: Diagnosis not present

## 2020-04-18 DIAGNOSIS — J811 Chronic pulmonary edema: Secondary | ICD-10-CM | POA: Diagnosis not present

## 2020-04-18 DIAGNOSIS — M6281 Muscle weakness (generalized): Secondary | ICD-10-CM | POA: Diagnosis not present

## 2020-04-18 DIAGNOSIS — R2689 Other abnormalities of gait and mobility: Secondary | ICD-10-CM | POA: Diagnosis not present

## 2020-04-18 DIAGNOSIS — M810 Age-related osteoporosis without current pathological fracture: Secondary | ICD-10-CM | POA: Diagnosis not present

## 2020-04-18 DIAGNOSIS — K5900 Constipation, unspecified: Secondary | ICD-10-CM | POA: Diagnosis not present

## 2020-04-18 DIAGNOSIS — Z515 Encounter for palliative care: Secondary | ICD-10-CM | POA: Diagnosis not present

## 2020-04-18 DIAGNOSIS — N400 Enlarged prostate without lower urinary tract symptoms: Secondary | ICD-10-CM | POA: Diagnosis not present

## 2020-04-18 DIAGNOSIS — G629 Polyneuropathy, unspecified: Secondary | ICD-10-CM | POA: Diagnosis not present

## 2020-04-18 DIAGNOSIS — Z905 Acquired absence of kidney: Secondary | ICD-10-CM | POA: Diagnosis not present

## 2020-04-18 DIAGNOSIS — Z743 Need for continuous supervision: Secondary | ICD-10-CM | POA: Diagnosis not present

## 2020-04-18 DIAGNOSIS — S36892A Contusion of other intra-abdominal organs, initial encounter: Secondary | ICD-10-CM | POA: Diagnosis not present

## 2020-04-18 DIAGNOSIS — I10 Essential (primary) hypertension: Secondary | ICD-10-CM | POA: Diagnosis not present

## 2020-04-18 DIAGNOSIS — R410 Disorientation, unspecified: Secondary | ICD-10-CM | POA: Diagnosis not present

## 2020-04-18 DIAGNOSIS — L03311 Cellulitis of abdominal wall: Secondary | ICD-10-CM | POA: Diagnosis not present

## 2020-04-18 DIAGNOSIS — J942 Hemothorax: Secondary | ICD-10-CM | POA: Diagnosis not present

## 2020-04-18 DIAGNOSIS — K9423 Gastrostomy malfunction: Secondary | ICD-10-CM | POA: Diagnosis not present

## 2020-04-18 DIAGNOSIS — R7401 Elevation of levels of liver transaminase levels: Secondary | ICD-10-CM | POA: Diagnosis not present

## 2020-04-18 DIAGNOSIS — Z9981 Dependence on supplemental oxygen: Secondary | ICD-10-CM | POA: Diagnosis not present

## 2020-04-18 DIAGNOSIS — S271XXA Traumatic hemothorax, initial encounter: Secondary | ICD-10-CM | POA: Diagnosis not present

## 2020-04-18 DIAGNOSIS — M79641 Pain in right hand: Secondary | ICD-10-CM | POA: Diagnosis not present

## 2020-04-18 DIAGNOSIS — M24542 Contracture, left hand: Secondary | ICD-10-CM | POA: Diagnosis not present

## 2020-04-18 DIAGNOSIS — I129 Hypertensive chronic kidney disease with stage 1 through stage 4 chronic kidney disease, or unspecified chronic kidney disease: Secondary | ICD-10-CM | POA: Diagnosis not present

## 2020-04-18 DIAGNOSIS — G9340 Encephalopathy, unspecified: Secondary | ICD-10-CM | POA: Diagnosis not present

## 2020-04-18 DIAGNOSIS — R1310 Dysphagia, unspecified: Secondary | ICD-10-CM | POA: Diagnosis not present

## 2020-04-18 DIAGNOSIS — J9621 Acute and chronic respiratory failure with hypoxia: Secondary | ICD-10-CM | POA: Diagnosis not present

## 2020-04-18 DIAGNOSIS — T1490XA Injury, unspecified, initial encounter: Secondary | ICD-10-CM | POA: Diagnosis not present

## 2020-04-18 DIAGNOSIS — F1190 Opioid use, unspecified, uncomplicated: Secondary | ICD-10-CM | POA: Diagnosis not present

## 2020-04-18 DIAGNOSIS — Z4659 Encounter for fitting and adjustment of other gastrointestinal appliance and device: Secondary | ICD-10-CM | POA: Diagnosis not present

## 2020-04-18 DIAGNOSIS — N179 Acute kidney failure, unspecified: Secondary | ICD-10-CM | POA: Diagnosis not present

## 2020-04-18 DIAGNOSIS — E785 Hyperlipidemia, unspecified: Secondary | ICD-10-CM | POA: Diagnosis not present

## 2020-04-19 DIAGNOSIS — J9601 Acute respiratory failure with hypoxia: Secondary | ICD-10-CM | POA: Diagnosis not present

## 2020-04-19 DIAGNOSIS — R5381 Other malaise: Secondary | ICD-10-CM | POA: Diagnosis not present

## 2020-04-19 DIAGNOSIS — R918 Other nonspecific abnormal finding of lung field: Secondary | ICD-10-CM | POA: Diagnosis not present

## 2020-04-19 DIAGNOSIS — Z4659 Encounter for fitting and adjustment of other gastrointestinal appliance and device: Secondary | ICD-10-CM | POA: Diagnosis not present

## 2020-04-19 DIAGNOSIS — R140 Abdominal distension (gaseous): Secondary | ICD-10-CM | POA: Diagnosis not present

## 2020-04-19 DIAGNOSIS — J942 Hemothorax: Secondary | ICD-10-CM

## 2020-04-19 DIAGNOSIS — G894 Chronic pain syndrome: Secondary | ICD-10-CM

## 2020-04-19 DIAGNOSIS — N182 Chronic kidney disease, stage 2 (mild): Secondary | ICD-10-CM

## 2020-04-19 DIAGNOSIS — Z430 Encounter for attention to tracheostomy: Secondary | ICD-10-CM | POA: Diagnosis not present

## 2020-04-19 DIAGNOSIS — J9621 Acute and chronic respiratory failure with hypoxia: Secondary | ICD-10-CM

## 2020-04-19 DIAGNOSIS — R1310 Dysphagia, unspecified: Secondary | ICD-10-CM | POA: Diagnosis not present

## 2020-04-19 DIAGNOSIS — Z452 Encounter for adjustment and management of vascular access device: Secondary | ICD-10-CM | POA: Diagnosis not present

## 2020-04-20 DIAGNOSIS — J9601 Acute respiratory failure with hypoxia: Secondary | ICD-10-CM | POA: Diagnosis not present

## 2020-04-20 DIAGNOSIS — R1310 Dysphagia, unspecified: Secondary | ICD-10-CM | POA: Diagnosis not present

## 2020-04-20 DIAGNOSIS — Z9911 Dependence on respirator [ventilator] status: Secondary | ICD-10-CM | POA: Diagnosis not present

## 2020-04-20 DIAGNOSIS — Z9989 Dependence on other enabling machines and devices: Secondary | ICD-10-CM | POA: Diagnosis not present

## 2020-04-20 DIAGNOSIS — G894 Chronic pain syndrome: Secondary | ICD-10-CM | POA: Diagnosis not present

## 2020-04-20 DIAGNOSIS — R5381 Other malaise: Secondary | ICD-10-CM | POA: Diagnosis not present

## 2020-04-20 DIAGNOSIS — J9621 Acute and chronic respiratory failure with hypoxia: Secondary | ICD-10-CM | POA: Diagnosis not present

## 2020-04-21 DIAGNOSIS — Z9911 Dependence on respirator [ventilator] status: Secondary | ICD-10-CM | POA: Diagnosis not present

## 2020-04-21 DIAGNOSIS — R5381 Other malaise: Secondary | ICD-10-CM | POA: Diagnosis not present

## 2020-04-21 DIAGNOSIS — G894 Chronic pain syndrome: Secondary | ICD-10-CM | POA: Diagnosis not present

## 2020-04-21 DIAGNOSIS — R1310 Dysphagia, unspecified: Secondary | ICD-10-CM | POA: Diagnosis not present

## 2020-04-21 DIAGNOSIS — Z9989 Dependence on other enabling machines and devices: Secondary | ICD-10-CM | POA: Diagnosis not present

## 2020-04-21 DIAGNOSIS — J9601 Acute respiratory failure with hypoxia: Secondary | ICD-10-CM | POA: Diagnosis not present

## 2020-04-21 DIAGNOSIS — J9621 Acute and chronic respiratory failure with hypoxia: Secondary | ICD-10-CM | POA: Diagnosis not present

## 2020-04-22 DIAGNOSIS — R1310 Dysphagia, unspecified: Secondary | ICD-10-CM | POA: Diagnosis not present

## 2020-04-22 DIAGNOSIS — J9621 Acute and chronic respiratory failure with hypoxia: Secondary | ICD-10-CM | POA: Diagnosis not present

## 2020-04-22 DIAGNOSIS — J9601 Acute respiratory failure with hypoxia: Secondary | ICD-10-CM | POA: Diagnosis not present

## 2020-04-22 DIAGNOSIS — R5381 Other malaise: Secondary | ICD-10-CM | POA: Diagnosis not present

## 2020-04-22 DIAGNOSIS — Z9911 Dependence on respirator [ventilator] status: Secondary | ICD-10-CM | POA: Diagnosis not present

## 2020-04-22 DIAGNOSIS — Z9989 Dependence on other enabling machines and devices: Secondary | ICD-10-CM | POA: Diagnosis not present

## 2020-04-22 DIAGNOSIS — G894 Chronic pain syndrome: Secondary | ICD-10-CM | POA: Diagnosis not present

## 2020-04-23 DIAGNOSIS — G8929 Other chronic pain: Secondary | ICD-10-CM | POA: Diagnosis not present

## 2020-04-23 DIAGNOSIS — E669 Obesity, unspecified: Secondary | ICD-10-CM | POA: Diagnosis not present

## 2020-04-23 DIAGNOSIS — G894 Chronic pain syndrome: Secondary | ICD-10-CM | POA: Diagnosis not present

## 2020-04-23 DIAGNOSIS — J9621 Acute and chronic respiratory failure with hypoxia: Secondary | ICD-10-CM | POA: Diagnosis not present

## 2020-04-23 DIAGNOSIS — R5381 Other malaise: Secondary | ICD-10-CM | POA: Diagnosis not present

## 2020-04-23 DIAGNOSIS — Z9911 Dependence on respirator [ventilator] status: Secondary | ICD-10-CM | POA: Diagnosis not present

## 2020-04-23 DIAGNOSIS — J9601 Acute respiratory failure with hypoxia: Secondary | ICD-10-CM | POA: Diagnosis not present

## 2020-04-23 DIAGNOSIS — Z9989 Dependence on other enabling machines and devices: Secondary | ICD-10-CM | POA: Diagnosis not present

## 2020-04-23 DIAGNOSIS — R1310 Dysphagia, unspecified: Secondary | ICD-10-CM | POA: Diagnosis not present

## 2020-04-24 DIAGNOSIS — Z9911 Dependence on respirator [ventilator] status: Secondary | ICD-10-CM | POA: Diagnosis not present

## 2020-04-24 DIAGNOSIS — Z9989 Dependence on other enabling machines and devices: Secondary | ICD-10-CM | POA: Diagnosis not present

## 2020-04-24 DIAGNOSIS — R5381 Other malaise: Secondary | ICD-10-CM | POA: Diagnosis not present

## 2020-04-24 DIAGNOSIS — R1311 Dysphagia, oral phase: Secondary | ICD-10-CM | POA: Diagnosis not present

## 2020-04-24 DIAGNOSIS — I2510 Atherosclerotic heart disease of native coronary artery without angina pectoris: Secondary | ICD-10-CM | POA: Diagnosis not present

## 2020-04-24 DIAGNOSIS — J9621 Acute and chronic respiratory failure with hypoxia: Secondary | ICD-10-CM | POA: Diagnosis not present

## 2020-04-24 DIAGNOSIS — I6789 Other cerebrovascular disease: Secondary | ICD-10-CM | POA: Diagnosis not present

## 2020-04-24 DIAGNOSIS — G894 Chronic pain syndrome: Secondary | ICD-10-CM | POA: Diagnosis not present

## 2020-04-24 DIAGNOSIS — J9601 Acute respiratory failure with hypoxia: Secondary | ICD-10-CM | POA: Diagnosis not present

## 2020-04-24 DIAGNOSIS — R1310 Dysphagia, unspecified: Secondary | ICD-10-CM | POA: Diagnosis not present

## 2020-04-25 DIAGNOSIS — E669 Obesity, unspecified: Secondary | ICD-10-CM | POA: Diagnosis not present

## 2020-04-25 DIAGNOSIS — R1310 Dysphagia, unspecified: Secondary | ICD-10-CM | POA: Diagnosis not present

## 2020-04-25 DIAGNOSIS — J9621 Acute and chronic respiratory failure with hypoxia: Secondary | ICD-10-CM | POA: Diagnosis not present

## 2020-04-25 DIAGNOSIS — J9601 Acute respiratory failure with hypoxia: Secondary | ICD-10-CM | POA: Diagnosis not present

## 2020-04-25 DIAGNOSIS — Z9911 Dependence on respirator [ventilator] status: Secondary | ICD-10-CM | POA: Diagnosis not present

## 2020-04-25 DIAGNOSIS — G8929 Other chronic pain: Secondary | ICD-10-CM | POA: Diagnosis not present

## 2020-04-25 DIAGNOSIS — Z9989 Dependence on other enabling machines and devices: Secondary | ICD-10-CM | POA: Diagnosis not present

## 2020-04-25 DIAGNOSIS — G894 Chronic pain syndrome: Secondary | ICD-10-CM | POA: Diagnosis not present

## 2020-04-25 DIAGNOSIS — R5381 Other malaise: Secondary | ICD-10-CM | POA: Diagnosis not present

## 2020-04-26 DIAGNOSIS — Z9911 Dependence on respirator [ventilator] status: Secondary | ICD-10-CM | POA: Diagnosis not present

## 2020-04-26 DIAGNOSIS — J9621 Acute and chronic respiratory failure with hypoxia: Secondary | ICD-10-CM | POA: Diagnosis not present

## 2020-04-26 DIAGNOSIS — J9601 Acute respiratory failure with hypoxia: Secondary | ICD-10-CM | POA: Diagnosis not present

## 2020-04-26 DIAGNOSIS — I69398 Other sequelae of cerebral infarction: Secondary | ICD-10-CM | POA: Diagnosis not present

## 2020-04-26 DIAGNOSIS — M6281 Muscle weakness (generalized): Secondary | ICD-10-CM | POA: Diagnosis not present

## 2020-04-26 DIAGNOSIS — R5381 Other malaise: Secondary | ICD-10-CM | POA: Diagnosis not present

## 2020-04-26 DIAGNOSIS — G894 Chronic pain syndrome: Secondary | ICD-10-CM | POA: Diagnosis not present

## 2020-04-26 DIAGNOSIS — R2689 Other abnormalities of gait and mobility: Secondary | ICD-10-CM | POA: Diagnosis not present

## 2020-04-26 DIAGNOSIS — R1310 Dysphagia, unspecified: Secondary | ICD-10-CM | POA: Diagnosis not present

## 2020-04-26 DIAGNOSIS — Z9989 Dependence on other enabling machines and devices: Secondary | ICD-10-CM | POA: Diagnosis not present

## 2020-04-26 DIAGNOSIS — R1311 Dysphagia, oral phase: Secondary | ICD-10-CM | POA: Diagnosis not present

## 2020-04-26 DIAGNOSIS — I2510 Atherosclerotic heart disease of native coronary artery without angina pectoris: Secondary | ICD-10-CM | POA: Diagnosis not present

## 2020-04-26 DIAGNOSIS — I6789 Other cerebrovascular disease: Secondary | ICD-10-CM | POA: Diagnosis not present

## 2020-04-27 DIAGNOSIS — J9621 Acute and chronic respiratory failure with hypoxia: Secondary | ICD-10-CM

## 2020-04-27 DIAGNOSIS — R5381 Other malaise: Secondary | ICD-10-CM | POA: Diagnosis not present

## 2020-04-27 DIAGNOSIS — J9601 Acute respiratory failure with hypoxia: Secondary | ICD-10-CM | POA: Diagnosis not present

## 2020-04-27 DIAGNOSIS — J942 Hemothorax: Secondary | ICD-10-CM

## 2020-04-27 DIAGNOSIS — R1310 Dysphagia, unspecified: Secondary | ICD-10-CM | POA: Diagnosis not present

## 2020-04-27 DIAGNOSIS — E669 Obesity, unspecified: Secondary | ICD-10-CM | POA: Diagnosis not present

## 2020-04-27 DIAGNOSIS — G8929 Other chronic pain: Secondary | ICD-10-CM | POA: Diagnosis not present

## 2020-04-27 DIAGNOSIS — G894 Chronic pain syndrome: Secondary | ICD-10-CM

## 2020-04-27 DIAGNOSIS — N182 Chronic kidney disease, stage 2 (mild): Secondary | ICD-10-CM

## 2020-04-28 DIAGNOSIS — J942 Hemothorax: Secondary | ICD-10-CM

## 2020-04-28 DIAGNOSIS — R5381 Other malaise: Secondary | ICD-10-CM | POA: Diagnosis not present

## 2020-04-28 DIAGNOSIS — R1310 Dysphagia, unspecified: Secondary | ICD-10-CM | POA: Diagnosis not present

## 2020-04-28 DIAGNOSIS — G894 Chronic pain syndrome: Secondary | ICD-10-CM

## 2020-04-28 DIAGNOSIS — J9621 Acute and chronic respiratory failure with hypoxia: Secondary | ICD-10-CM

## 2020-04-28 DIAGNOSIS — N182 Chronic kidney disease, stage 2 (mild): Secondary | ICD-10-CM

## 2020-04-28 DIAGNOSIS — J9601 Acute respiratory failure with hypoxia: Secondary | ICD-10-CM | POA: Diagnosis not present

## 2020-04-29 DIAGNOSIS — J9601 Acute respiratory failure with hypoxia: Secondary | ICD-10-CM | POA: Diagnosis not present

## 2020-04-29 DIAGNOSIS — G894 Chronic pain syndrome: Secondary | ICD-10-CM

## 2020-04-29 DIAGNOSIS — R1310 Dysphagia, unspecified: Secondary | ICD-10-CM | POA: Diagnosis not present

## 2020-04-29 DIAGNOSIS — R5381 Other malaise: Secondary | ICD-10-CM | POA: Diagnosis not present

## 2020-04-29 DIAGNOSIS — J942 Hemothorax: Secondary | ICD-10-CM

## 2020-04-29 DIAGNOSIS — J9621 Acute and chronic respiratory failure with hypoxia: Secondary | ICD-10-CM

## 2020-04-29 DIAGNOSIS — N182 Chronic kidney disease, stage 2 (mild): Secondary | ICD-10-CM

## 2020-04-30 DIAGNOSIS — E669 Obesity, unspecified: Secondary | ICD-10-CM | POA: Diagnosis not present

## 2020-04-30 DIAGNOSIS — G8929 Other chronic pain: Secondary | ICD-10-CM | POA: Diagnosis not present

## 2020-04-30 DIAGNOSIS — J9601 Acute respiratory failure with hypoxia: Secondary | ICD-10-CM | POA: Diagnosis not present

## 2020-04-30 DIAGNOSIS — N182 Chronic kidney disease, stage 2 (mild): Secondary | ICD-10-CM

## 2020-04-30 DIAGNOSIS — R5381 Other malaise: Secondary | ICD-10-CM | POA: Diagnosis not present

## 2020-04-30 DIAGNOSIS — J942 Hemothorax: Secondary | ICD-10-CM

## 2020-04-30 DIAGNOSIS — R1310 Dysphagia, unspecified: Secondary | ICD-10-CM | POA: Diagnosis not present

## 2020-04-30 DIAGNOSIS — G894 Chronic pain syndrome: Secondary | ICD-10-CM

## 2020-04-30 DIAGNOSIS — J9621 Acute and chronic respiratory failure with hypoxia: Secondary | ICD-10-CM

## 2020-04-30 DIAGNOSIS — I679 Cerebrovascular disease, unspecified: Secondary | ICD-10-CM | POA: Diagnosis not present

## 2020-05-01 DIAGNOSIS — N182 Chronic kidney disease, stage 2 (mild): Secondary | ICD-10-CM

## 2020-05-01 DIAGNOSIS — J9601 Acute respiratory failure with hypoxia: Secondary | ICD-10-CM | POA: Diagnosis not present

## 2020-05-01 DIAGNOSIS — Z9911 Dependence on respirator [ventilator] status: Secondary | ICD-10-CM | POA: Diagnosis not present

## 2020-05-01 DIAGNOSIS — R5381 Other malaise: Secondary | ICD-10-CM | POA: Diagnosis not present

## 2020-05-01 DIAGNOSIS — I2510 Atherosclerotic heart disease of native coronary artery without angina pectoris: Secondary | ICD-10-CM | POA: Diagnosis not present

## 2020-05-01 DIAGNOSIS — J9621 Acute and chronic respiratory failure with hypoxia: Secondary | ICD-10-CM

## 2020-05-01 DIAGNOSIS — I6789 Other cerebrovascular disease: Secondary | ICD-10-CM | POA: Diagnosis not present

## 2020-05-01 DIAGNOSIS — R1311 Dysphagia, oral phase: Secondary | ICD-10-CM | POA: Diagnosis not present

## 2020-05-01 DIAGNOSIS — R1310 Dysphagia, unspecified: Secondary | ICD-10-CM | POA: Diagnosis not present

## 2020-05-01 DIAGNOSIS — G894 Chronic pain syndrome: Secondary | ICD-10-CM

## 2020-05-01 DIAGNOSIS — J942 Hemothorax: Secondary | ICD-10-CM

## 2020-05-02 DIAGNOSIS — R2689 Other abnormalities of gait and mobility: Secondary | ICD-10-CM | POA: Diagnosis not present

## 2020-05-02 DIAGNOSIS — J942 Hemothorax: Secondary | ICD-10-CM

## 2020-05-02 DIAGNOSIS — I69398 Other sequelae of cerebral infarction: Secondary | ICD-10-CM | POA: Diagnosis not present

## 2020-05-02 DIAGNOSIS — E669 Obesity, unspecified: Secondary | ICD-10-CM | POA: Diagnosis not present

## 2020-05-02 DIAGNOSIS — J9621 Acute and chronic respiratory failure with hypoxia: Secondary | ICD-10-CM

## 2020-05-02 DIAGNOSIS — R5381 Other malaise: Secondary | ICD-10-CM | POA: Diagnosis not present

## 2020-05-02 DIAGNOSIS — G8929 Other chronic pain: Secondary | ICD-10-CM | POA: Diagnosis not present

## 2020-05-02 DIAGNOSIS — G894 Chronic pain syndrome: Secondary | ICD-10-CM

## 2020-05-02 DIAGNOSIS — N182 Chronic kidney disease, stage 2 (mild): Secondary | ICD-10-CM

## 2020-05-02 DIAGNOSIS — M6281 Muscle weakness (generalized): Secondary | ICD-10-CM | POA: Diagnosis not present

## 2020-05-02 DIAGNOSIS — J9601 Acute respiratory failure with hypoxia: Secondary | ICD-10-CM | POA: Diagnosis not present

## 2020-05-02 DIAGNOSIS — R1310 Dysphagia, unspecified: Secondary | ICD-10-CM | POA: Diagnosis not present

## 2020-05-03 DIAGNOSIS — R1311 Dysphagia, oral phase: Secondary | ICD-10-CM | POA: Diagnosis not present

## 2020-05-03 DIAGNOSIS — J9621 Acute and chronic respiratory failure with hypoxia: Secondary | ICD-10-CM

## 2020-05-03 DIAGNOSIS — Z430 Encounter for attention to tracheostomy: Secondary | ICD-10-CM | POA: Diagnosis not present

## 2020-05-03 DIAGNOSIS — I2510 Atherosclerotic heart disease of native coronary artery without angina pectoris: Secondary | ICD-10-CM | POA: Diagnosis not present

## 2020-05-03 DIAGNOSIS — R918 Other nonspecific abnormal finding of lung field: Secondary | ICD-10-CM | POA: Diagnosis not present

## 2020-05-03 DIAGNOSIS — R1310 Dysphagia, unspecified: Secondary | ICD-10-CM | POA: Diagnosis not present

## 2020-05-03 DIAGNOSIS — J9601 Acute respiratory failure with hypoxia: Secondary | ICD-10-CM | POA: Diagnosis not present

## 2020-05-03 DIAGNOSIS — I6789 Other cerebrovascular disease: Secondary | ICD-10-CM | POA: Diagnosis not present

## 2020-05-03 DIAGNOSIS — Z9911 Dependence on respirator [ventilator] status: Secondary | ICD-10-CM | POA: Diagnosis not present

## 2020-05-03 DIAGNOSIS — R5381 Other malaise: Secondary | ICD-10-CM | POA: Diagnosis not present

## 2020-05-03 DIAGNOSIS — J942 Hemothorax: Secondary | ICD-10-CM

## 2020-05-03 DIAGNOSIS — N182 Chronic kidney disease, stage 2 (mild): Secondary | ICD-10-CM

## 2020-05-03 DIAGNOSIS — G894 Chronic pain syndrome: Secondary | ICD-10-CM

## 2020-05-04 DIAGNOSIS — N182 Chronic kidney disease, stage 2 (mild): Secondary | ICD-10-CM

## 2020-05-04 DIAGNOSIS — J9621 Acute and chronic respiratory failure with hypoxia: Secondary | ICD-10-CM

## 2020-05-04 DIAGNOSIS — G894 Chronic pain syndrome: Secondary | ICD-10-CM

## 2020-05-04 DIAGNOSIS — J942 Hemothorax: Secondary | ICD-10-CM

## 2020-05-04 DIAGNOSIS — G8929 Other chronic pain: Secondary | ICD-10-CM | POA: Diagnosis not present

## 2020-05-04 DIAGNOSIS — E669 Obesity, unspecified: Secondary | ICD-10-CM | POA: Diagnosis not present

## 2020-05-04 DIAGNOSIS — R5381 Other malaise: Secondary | ICD-10-CM | POA: Diagnosis not present

## 2020-05-04 DIAGNOSIS — J9601 Acute respiratory failure with hypoxia: Secondary | ICD-10-CM | POA: Diagnosis not present

## 2020-05-04 DIAGNOSIS — R1310 Dysphagia, unspecified: Secondary | ICD-10-CM | POA: Diagnosis not present

## 2020-05-05 DIAGNOSIS — J9601 Acute respiratory failure with hypoxia: Secondary | ICD-10-CM | POA: Diagnosis not present

## 2020-05-05 DIAGNOSIS — J942 Hemothorax: Secondary | ICD-10-CM

## 2020-05-05 DIAGNOSIS — G894 Chronic pain syndrome: Secondary | ICD-10-CM

## 2020-05-05 DIAGNOSIS — R1310 Dysphagia, unspecified: Secondary | ICD-10-CM | POA: Diagnosis not present

## 2020-05-05 DIAGNOSIS — R5381 Other malaise: Secondary | ICD-10-CM | POA: Diagnosis not present

## 2020-05-05 DIAGNOSIS — N182 Chronic kidney disease, stage 2 (mild): Secondary | ICD-10-CM

## 2020-05-05 DIAGNOSIS — J9621 Acute and chronic respiratory failure with hypoxia: Secondary | ICD-10-CM

## 2020-05-06 DIAGNOSIS — G894 Chronic pain syndrome: Secondary | ICD-10-CM

## 2020-05-06 DIAGNOSIS — J9601 Acute respiratory failure with hypoxia: Secondary | ICD-10-CM | POA: Diagnosis not present

## 2020-05-06 DIAGNOSIS — R1310 Dysphagia, unspecified: Secondary | ICD-10-CM | POA: Diagnosis not present

## 2020-05-06 DIAGNOSIS — R5381 Other malaise: Secondary | ICD-10-CM | POA: Diagnosis not present

## 2020-05-06 DIAGNOSIS — J942 Hemothorax: Secondary | ICD-10-CM

## 2020-05-06 DIAGNOSIS — N182 Chronic kidney disease, stage 2 (mild): Secondary | ICD-10-CM

## 2020-05-06 DIAGNOSIS — Z431 Encounter for attention to gastrostomy: Secondary | ICD-10-CM | POA: Diagnosis not present

## 2020-05-06 DIAGNOSIS — K9422 Gastrostomy infection: Secondary | ICD-10-CM | POA: Diagnosis not present

## 2020-05-06 DIAGNOSIS — J9621 Acute and chronic respiratory failure with hypoxia: Secondary | ICD-10-CM

## 2020-05-06 DIAGNOSIS — R140 Abdominal distension (gaseous): Secondary | ICD-10-CM | POA: Diagnosis not present

## 2020-05-07 DIAGNOSIS — R2689 Other abnormalities of gait and mobility: Secondary | ICD-10-CM | POA: Diagnosis not present

## 2020-05-07 DIAGNOSIS — M6281 Muscle weakness (generalized): Secondary | ICD-10-CM | POA: Diagnosis not present

## 2020-05-07 DIAGNOSIS — Z9989 Dependence on other enabling machines and devices: Secondary | ICD-10-CM | POA: Diagnosis not present

## 2020-05-07 DIAGNOSIS — J9601 Acute respiratory failure with hypoxia: Secondary | ICD-10-CM | POA: Diagnosis not present

## 2020-05-07 DIAGNOSIS — J9621 Acute and chronic respiratory failure with hypoxia: Secondary | ICD-10-CM | POA: Diagnosis not present

## 2020-05-07 DIAGNOSIS — R1310 Dysphagia, unspecified: Secondary | ICD-10-CM | POA: Diagnosis not present

## 2020-05-07 DIAGNOSIS — G8929 Other chronic pain: Secondary | ICD-10-CM | POA: Diagnosis not present

## 2020-05-07 DIAGNOSIS — I69398 Other sequelae of cerebral infarction: Secondary | ICD-10-CM | POA: Diagnosis not present

## 2020-05-07 DIAGNOSIS — E669 Obesity, unspecified: Secondary | ICD-10-CM | POA: Diagnosis not present

## 2020-05-07 DIAGNOSIS — G894 Chronic pain syndrome: Secondary | ICD-10-CM | POA: Diagnosis not present

## 2020-05-07 DIAGNOSIS — R5381 Other malaise: Secondary | ICD-10-CM | POA: Diagnosis not present

## 2020-05-07 DIAGNOSIS — S2242XG Multiple fractures of ribs, left side, subsequent encounter for fracture with delayed healing: Secondary | ICD-10-CM | POA: Diagnosis not present

## 2020-05-08 DIAGNOSIS — R1311 Dysphagia, oral phase: Secondary | ICD-10-CM | POA: Diagnosis not present

## 2020-05-08 DIAGNOSIS — Z9911 Dependence on respirator [ventilator] status: Secondary | ICD-10-CM | POA: Diagnosis not present

## 2020-05-08 DIAGNOSIS — J9601 Acute respiratory failure with hypoxia: Secondary | ICD-10-CM | POA: Diagnosis not present

## 2020-05-08 DIAGNOSIS — K9423 Gastrostomy malfunction: Secondary | ICD-10-CM | POA: Diagnosis not present

## 2020-05-08 DIAGNOSIS — R1310 Dysphagia, unspecified: Secondary | ICD-10-CM | POA: Diagnosis not present

## 2020-05-08 DIAGNOSIS — G894 Chronic pain syndrome: Secondary | ICD-10-CM | POA: Diagnosis not present

## 2020-05-08 DIAGNOSIS — J9621 Acute and chronic respiratory failure with hypoxia: Secondary | ICD-10-CM | POA: Diagnosis not present

## 2020-05-08 DIAGNOSIS — S2242XG Multiple fractures of ribs, left side, subsequent encounter for fracture with delayed healing: Secondary | ICD-10-CM | POA: Diagnosis not present

## 2020-05-08 DIAGNOSIS — Z9989 Dependence on other enabling machines and devices: Secondary | ICD-10-CM | POA: Diagnosis not present

## 2020-05-08 DIAGNOSIS — I10 Essential (primary) hypertension: Secondary | ICD-10-CM | POA: Diagnosis not present

## 2020-05-09 DIAGNOSIS — G894 Chronic pain syndrome: Secondary | ICD-10-CM | POA: Diagnosis not present

## 2020-05-09 DIAGNOSIS — Z9989 Dependence on other enabling machines and devices: Secondary | ICD-10-CM | POA: Diagnosis not present

## 2020-05-09 DIAGNOSIS — M6281 Muscle weakness (generalized): Secondary | ICD-10-CM | POA: Diagnosis not present

## 2020-05-09 DIAGNOSIS — E669 Obesity, unspecified: Secondary | ICD-10-CM | POA: Diagnosis not present

## 2020-05-09 DIAGNOSIS — S2242XG Multiple fractures of ribs, left side, subsequent encounter for fracture with delayed healing: Secondary | ICD-10-CM | POA: Diagnosis not present

## 2020-05-09 DIAGNOSIS — R1310 Dysphagia, unspecified: Secondary | ICD-10-CM | POA: Diagnosis not present

## 2020-05-09 DIAGNOSIS — R2689 Other abnormalities of gait and mobility: Secondary | ICD-10-CM | POA: Diagnosis not present

## 2020-05-09 DIAGNOSIS — R5381 Other malaise: Secondary | ICD-10-CM | POA: Diagnosis not present

## 2020-05-09 DIAGNOSIS — G8929 Other chronic pain: Secondary | ICD-10-CM | POA: Diagnosis not present

## 2020-05-09 DIAGNOSIS — J9621 Acute and chronic respiratory failure with hypoxia: Secondary | ICD-10-CM | POA: Diagnosis not present

## 2020-05-09 DIAGNOSIS — I69398 Other sequelae of cerebral infarction: Secondary | ICD-10-CM | POA: Diagnosis not present

## 2020-05-09 DIAGNOSIS — J9601 Acute respiratory failure with hypoxia: Secondary | ICD-10-CM | POA: Diagnosis not present

## 2020-05-10 DIAGNOSIS — I10 Essential (primary) hypertension: Secondary | ICD-10-CM | POA: Diagnosis not present

## 2020-05-10 DIAGNOSIS — Z9911 Dependence on respirator [ventilator] status: Secondary | ICD-10-CM | POA: Diagnosis not present

## 2020-05-10 DIAGNOSIS — R1311 Dysphagia, oral phase: Secondary | ICD-10-CM | POA: Diagnosis not present

## 2020-05-10 DIAGNOSIS — J9601 Acute respiratory failure with hypoxia: Secondary | ICD-10-CM | POA: Diagnosis not present

## 2020-05-10 DIAGNOSIS — J9621 Acute and chronic respiratory failure with hypoxia: Secondary | ICD-10-CM | POA: Diagnosis not present

## 2020-05-10 DIAGNOSIS — G894 Chronic pain syndrome: Secondary | ICD-10-CM | POA: Diagnosis not present

## 2020-05-10 DIAGNOSIS — R1310 Dysphagia, unspecified: Secondary | ICD-10-CM | POA: Diagnosis not present

## 2020-05-10 DIAGNOSIS — K9423 Gastrostomy malfunction: Secondary | ICD-10-CM | POA: Diagnosis not present

## 2020-05-10 DIAGNOSIS — S36892A Contusion of other intra-abdominal organs, initial encounter: Secondary | ICD-10-CM | POA: Diagnosis not present

## 2020-05-10 DIAGNOSIS — S2242XG Multiple fractures of ribs, left side, subsequent encounter for fracture with delayed healing: Secondary | ICD-10-CM | POA: Diagnosis not present

## 2020-05-10 DIAGNOSIS — Z9989 Dependence on other enabling machines and devices: Secondary | ICD-10-CM | POA: Diagnosis not present

## 2020-05-11 DIAGNOSIS — J9601 Acute respiratory failure with hypoxia: Secondary | ICD-10-CM | POA: Diagnosis not present

## 2020-05-11 DIAGNOSIS — G8929 Other chronic pain: Secondary | ICD-10-CM | POA: Diagnosis not present

## 2020-05-11 DIAGNOSIS — E669 Obesity, unspecified: Secondary | ICD-10-CM | POA: Diagnosis not present

## 2020-05-11 DIAGNOSIS — Z9989 Dependence on other enabling machines and devices: Secondary | ICD-10-CM | POA: Diagnosis not present

## 2020-05-11 DIAGNOSIS — R1310 Dysphagia, unspecified: Secondary | ICD-10-CM | POA: Diagnosis not present

## 2020-05-11 DIAGNOSIS — G894 Chronic pain syndrome: Secondary | ICD-10-CM | POA: Diagnosis not present

## 2020-05-11 DIAGNOSIS — S2242XG Multiple fractures of ribs, left side, subsequent encounter for fracture with delayed healing: Secondary | ICD-10-CM | POA: Diagnosis not present

## 2020-05-11 DIAGNOSIS — J9621 Acute and chronic respiratory failure with hypoxia: Secondary | ICD-10-CM | POA: Diagnosis not present

## 2020-05-12 DIAGNOSIS — J9621 Acute and chronic respiratory failure with hypoxia: Secondary | ICD-10-CM | POA: Diagnosis not present

## 2020-05-12 DIAGNOSIS — Z9989 Dependence on other enabling machines and devices: Secondary | ICD-10-CM | POA: Diagnosis not present

## 2020-05-12 DIAGNOSIS — J9601 Acute respiratory failure with hypoxia: Secondary | ICD-10-CM | POA: Diagnosis not present

## 2020-05-12 DIAGNOSIS — G894 Chronic pain syndrome: Secondary | ICD-10-CM | POA: Diagnosis not present

## 2020-05-12 DIAGNOSIS — S2242XG Multiple fractures of ribs, left side, subsequent encounter for fracture with delayed healing: Secondary | ICD-10-CM | POA: Diagnosis not present

## 2020-05-12 DIAGNOSIS — R1310 Dysphagia, unspecified: Secondary | ICD-10-CM | POA: Diagnosis not present

## 2020-05-13 DIAGNOSIS — G894 Chronic pain syndrome: Secondary | ICD-10-CM | POA: Diagnosis not present

## 2020-05-13 DIAGNOSIS — J9601 Acute respiratory failure with hypoxia: Secondary | ICD-10-CM | POA: Diagnosis not present

## 2020-05-13 DIAGNOSIS — Z9989 Dependence on other enabling machines and devices: Secondary | ICD-10-CM | POA: Diagnosis not present

## 2020-05-13 DIAGNOSIS — S2242XG Multiple fractures of ribs, left side, subsequent encounter for fracture with delayed healing: Secondary | ICD-10-CM | POA: Diagnosis not present

## 2020-05-13 DIAGNOSIS — J9621 Acute and chronic respiratory failure with hypoxia: Secondary | ICD-10-CM | POA: Diagnosis not present

## 2020-05-13 DIAGNOSIS — R1310 Dysphagia, unspecified: Secondary | ICD-10-CM | POA: Diagnosis not present

## 2020-05-14 DIAGNOSIS — N182 Chronic kidney disease, stage 2 (mild): Secondary | ICD-10-CM

## 2020-05-14 DIAGNOSIS — J9601 Acute respiratory failure with hypoxia: Secondary | ICD-10-CM | POA: Diagnosis not present

## 2020-05-14 DIAGNOSIS — G894 Chronic pain syndrome: Secondary | ICD-10-CM

## 2020-05-14 DIAGNOSIS — R1310 Dysphagia, unspecified: Secondary | ICD-10-CM | POA: Diagnosis not present

## 2020-05-14 DIAGNOSIS — G8929 Other chronic pain: Secondary | ICD-10-CM | POA: Diagnosis not present

## 2020-05-14 DIAGNOSIS — J942 Hemothorax: Secondary | ICD-10-CM

## 2020-05-14 DIAGNOSIS — E669 Obesity, unspecified: Secondary | ICD-10-CM | POA: Diagnosis not present

## 2020-05-14 DIAGNOSIS — S2242XG Multiple fractures of ribs, left side, subsequent encounter for fracture with delayed healing: Secondary | ICD-10-CM | POA: Diagnosis not present

## 2020-05-14 DIAGNOSIS — J9621 Acute and chronic respiratory failure with hypoxia: Secondary | ICD-10-CM

## 2020-05-15 DIAGNOSIS — G894 Chronic pain syndrome: Secondary | ICD-10-CM

## 2020-05-15 DIAGNOSIS — J942 Hemothorax: Secondary | ICD-10-CM

## 2020-05-15 DIAGNOSIS — R5381 Other malaise: Secondary | ICD-10-CM | POA: Diagnosis not present

## 2020-05-15 DIAGNOSIS — I69398 Other sequelae of cerebral infarction: Secondary | ICD-10-CM | POA: Diagnosis not present

## 2020-05-15 DIAGNOSIS — J9621 Acute and chronic respiratory failure with hypoxia: Secondary | ICD-10-CM

## 2020-05-15 DIAGNOSIS — R2689 Other abnormalities of gait and mobility: Secondary | ICD-10-CM | POA: Diagnosis not present

## 2020-05-15 DIAGNOSIS — M6281 Muscle weakness (generalized): Secondary | ICD-10-CM | POA: Diagnosis not present

## 2020-05-15 DIAGNOSIS — N182 Chronic kidney disease, stage 2 (mild): Secondary | ICD-10-CM

## 2020-05-15 DIAGNOSIS — J9601 Acute respiratory failure with hypoxia: Secondary | ICD-10-CM | POA: Diagnosis not present

## 2020-05-15 DIAGNOSIS — S2242XG Multiple fractures of ribs, left side, subsequent encounter for fracture with delayed healing: Secondary | ICD-10-CM | POA: Diagnosis not present

## 2020-05-15 DIAGNOSIS — R1310 Dysphagia, unspecified: Secondary | ICD-10-CM | POA: Diagnosis not present

## 2020-05-16 DIAGNOSIS — J9621 Acute and chronic respiratory failure with hypoxia: Secondary | ICD-10-CM

## 2020-05-16 DIAGNOSIS — J942 Hemothorax: Secondary | ICD-10-CM

## 2020-05-16 DIAGNOSIS — S2242XG Multiple fractures of ribs, left side, subsequent encounter for fracture with delayed healing: Secondary | ICD-10-CM | POA: Diagnosis not present

## 2020-05-16 DIAGNOSIS — R1310 Dysphagia, unspecified: Secondary | ICD-10-CM | POA: Diagnosis not present

## 2020-05-16 DIAGNOSIS — N182 Chronic kidney disease, stage 2 (mild): Secondary | ICD-10-CM

## 2020-05-16 DIAGNOSIS — G8929 Other chronic pain: Secondary | ICD-10-CM | POA: Diagnosis not present

## 2020-05-16 DIAGNOSIS — G894 Chronic pain syndrome: Secondary | ICD-10-CM

## 2020-05-16 DIAGNOSIS — E669 Obesity, unspecified: Secondary | ICD-10-CM | POA: Diagnosis not present

## 2020-05-16 DIAGNOSIS — J9601 Acute respiratory failure with hypoxia: Secondary | ICD-10-CM | POA: Diagnosis not present

## 2020-05-17 DIAGNOSIS — R1310 Dysphagia, unspecified: Secondary | ICD-10-CM | POA: Diagnosis not present

## 2020-05-17 DIAGNOSIS — N182 Chronic kidney disease, stage 2 (mild): Secondary | ICD-10-CM

## 2020-05-17 DIAGNOSIS — R2689 Other abnormalities of gait and mobility: Secondary | ICD-10-CM | POA: Diagnosis not present

## 2020-05-17 DIAGNOSIS — J9601 Acute respiratory failure with hypoxia: Secondary | ICD-10-CM | POA: Diagnosis not present

## 2020-05-17 DIAGNOSIS — G894 Chronic pain syndrome: Secondary | ICD-10-CM

## 2020-05-17 DIAGNOSIS — J9621 Acute and chronic respiratory failure with hypoxia: Secondary | ICD-10-CM

## 2020-05-17 DIAGNOSIS — R5381 Other malaise: Secondary | ICD-10-CM | POA: Diagnosis not present

## 2020-05-17 DIAGNOSIS — I69398 Other sequelae of cerebral infarction: Secondary | ICD-10-CM | POA: Diagnosis not present

## 2020-05-17 DIAGNOSIS — M6281 Muscle weakness (generalized): Secondary | ICD-10-CM | POA: Diagnosis not present

## 2020-05-17 DIAGNOSIS — J942 Hemothorax: Secondary | ICD-10-CM

## 2020-05-17 DIAGNOSIS — S2242XG Multiple fractures of ribs, left side, subsequent encounter for fracture with delayed healing: Secondary | ICD-10-CM | POA: Diagnosis not present

## 2020-05-18 DIAGNOSIS — S2242XG Multiple fractures of ribs, left side, subsequent encounter for fracture with delayed healing: Secondary | ICD-10-CM | POA: Diagnosis not present

## 2020-05-18 DIAGNOSIS — J942 Hemothorax: Secondary | ICD-10-CM

## 2020-05-18 DIAGNOSIS — J9601 Acute respiratory failure with hypoxia: Secondary | ICD-10-CM | POA: Diagnosis not present

## 2020-05-18 DIAGNOSIS — R1310 Dysphagia, unspecified: Secondary | ICD-10-CM | POA: Diagnosis not present

## 2020-05-18 DIAGNOSIS — N182 Chronic kidney disease, stage 2 (mild): Secondary | ICD-10-CM

## 2020-05-18 DIAGNOSIS — G894 Chronic pain syndrome: Secondary | ICD-10-CM

## 2020-05-18 DIAGNOSIS — G8929 Other chronic pain: Secondary | ICD-10-CM | POA: Diagnosis not present

## 2020-05-18 DIAGNOSIS — J9621 Acute and chronic respiratory failure with hypoxia: Secondary | ICD-10-CM

## 2020-05-18 DIAGNOSIS — E669 Obesity, unspecified: Secondary | ICD-10-CM | POA: Diagnosis not present

## 2020-05-19 DIAGNOSIS — R1310 Dysphagia, unspecified: Secondary | ICD-10-CM | POA: Diagnosis not present

## 2020-05-19 DIAGNOSIS — S2242XG Multiple fractures of ribs, left side, subsequent encounter for fracture with delayed healing: Secondary | ICD-10-CM | POA: Diagnosis not present

## 2020-05-19 DIAGNOSIS — G894 Chronic pain syndrome: Secondary | ICD-10-CM

## 2020-05-19 DIAGNOSIS — N182 Chronic kidney disease, stage 2 (mild): Secondary | ICD-10-CM

## 2020-05-19 DIAGNOSIS — J9621 Acute and chronic respiratory failure with hypoxia: Secondary | ICD-10-CM

## 2020-05-19 DIAGNOSIS — J942 Hemothorax: Secondary | ICD-10-CM

## 2020-05-19 DIAGNOSIS — J9601 Acute respiratory failure with hypoxia: Secondary | ICD-10-CM | POA: Diagnosis not present

## 2020-05-20 DIAGNOSIS — J942 Hemothorax: Secondary | ICD-10-CM

## 2020-05-20 DIAGNOSIS — S2242XG Multiple fractures of ribs, left side, subsequent encounter for fracture with delayed healing: Secondary | ICD-10-CM | POA: Diagnosis not present

## 2020-05-20 DIAGNOSIS — N182 Chronic kidney disease, stage 2 (mild): Secondary | ICD-10-CM

## 2020-05-20 DIAGNOSIS — R1310 Dysphagia, unspecified: Secondary | ICD-10-CM | POA: Diagnosis not present

## 2020-05-20 DIAGNOSIS — J9621 Acute and chronic respiratory failure with hypoxia: Secondary | ICD-10-CM

## 2020-05-20 DIAGNOSIS — J9601 Acute respiratory failure with hypoxia: Secondary | ICD-10-CM | POA: Diagnosis not present

## 2020-05-20 DIAGNOSIS — G894 Chronic pain syndrome: Secondary | ICD-10-CM

## 2020-05-21 DIAGNOSIS — Z9981 Dependence on supplemental oxygen: Secondary | ICD-10-CM | POA: Diagnosis not present

## 2020-05-21 DIAGNOSIS — R1310 Dysphagia, unspecified: Secondary | ICD-10-CM | POA: Diagnosis not present

## 2020-05-21 DIAGNOSIS — G894 Chronic pain syndrome: Secondary | ICD-10-CM | POA: Diagnosis not present

## 2020-05-21 DIAGNOSIS — G8929 Other chronic pain: Secondary | ICD-10-CM | POA: Diagnosis not present

## 2020-05-21 DIAGNOSIS — J9601 Acute respiratory failure with hypoxia: Secondary | ICD-10-CM | POA: Diagnosis not present

## 2020-05-21 DIAGNOSIS — R5381 Other malaise: Secondary | ICD-10-CM | POA: Diagnosis not present

## 2020-05-21 DIAGNOSIS — J9621 Acute and chronic respiratory failure with hypoxia: Secondary | ICD-10-CM | POA: Diagnosis not present

## 2020-05-21 DIAGNOSIS — E669 Obesity, unspecified: Secondary | ICD-10-CM | POA: Diagnosis not present

## 2020-05-22 DIAGNOSIS — E785 Hyperlipidemia, unspecified: Secondary | ICD-10-CM | POA: Diagnosis not present

## 2020-05-22 DIAGNOSIS — M24542 Contracture, left hand: Secondary | ICD-10-CM | POA: Diagnosis not present

## 2020-05-22 DIAGNOSIS — J9600 Acute respiratory failure, unspecified whether with hypoxia or hypercapnia: Secondary | ICD-10-CM | POA: Diagnosis not present

## 2020-05-22 DIAGNOSIS — I69391 Dysphagia following cerebral infarction: Secondary | ICD-10-CM | POA: Diagnosis not present

## 2020-05-22 DIAGNOSIS — Z905 Acquired absence of kidney: Secondary | ICD-10-CM | POA: Diagnosis not present

## 2020-05-22 DIAGNOSIS — R5381 Other malaise: Secondary | ICD-10-CM | POA: Diagnosis not present

## 2020-05-22 DIAGNOSIS — Z9981 Dependence on supplemental oxygen: Secondary | ICD-10-CM | POA: Diagnosis not present

## 2020-05-22 DIAGNOSIS — G609 Hereditary and idiopathic neuropathy, unspecified: Secondary | ICD-10-CM | POA: Diagnosis not present

## 2020-05-22 DIAGNOSIS — M2550 Pain in unspecified joint: Secondary | ICD-10-CM | POA: Diagnosis not present

## 2020-05-22 DIAGNOSIS — S2242XD Multiple fractures of ribs, left side, subsequent encounter for fracture with routine healing: Secondary | ICD-10-CM | POA: Diagnosis not present

## 2020-05-22 DIAGNOSIS — J9601 Acute respiratory failure with hypoxia: Secondary | ICD-10-CM | POA: Diagnosis not present

## 2020-05-22 DIAGNOSIS — Z931 Gastrostomy status: Secondary | ICD-10-CM | POA: Diagnosis not present

## 2020-05-22 DIAGNOSIS — I10 Essential (primary) hypertension: Secondary | ICD-10-CM | POA: Diagnosis not present

## 2020-05-22 DIAGNOSIS — R1310 Dysphagia, unspecified: Secondary | ICD-10-CM | POA: Diagnosis not present

## 2020-05-22 DIAGNOSIS — S36892A Contusion of other intra-abdominal organs, initial encounter: Secondary | ICD-10-CM | POA: Diagnosis not present

## 2020-05-22 DIAGNOSIS — Z85528 Personal history of other malignant neoplasm of kidney: Secondary | ICD-10-CM | POA: Diagnosis not present

## 2020-05-22 DIAGNOSIS — I1310 Hypertensive heart and chronic kidney disease without heart failure, with stage 1 through stage 4 chronic kidney disease, or unspecified chronic kidney disease: Secondary | ICD-10-CM | POA: Diagnosis not present

## 2020-05-22 DIAGNOSIS — N182 Chronic kidney disease, stage 2 (mild): Secondary | ICD-10-CM | POA: Diagnosis not present

## 2020-05-22 DIAGNOSIS — J9621 Acute and chronic respiratory failure with hypoxia: Secondary | ICD-10-CM | POA: Diagnosis not present

## 2020-05-22 DIAGNOSIS — Z7401 Bed confinement status: Secondary | ICD-10-CM | POA: Diagnosis not present

## 2020-05-22 DIAGNOSIS — N400 Enlarged prostate without lower urinary tract symptoms: Secondary | ICD-10-CM | POA: Diagnosis not present

## 2020-05-22 DIAGNOSIS — M24531 Contracture, right wrist: Secondary | ICD-10-CM | POA: Diagnosis not present

## 2020-05-22 DIAGNOSIS — F411 Generalized anxiety disorder: Secondary | ICD-10-CM | POA: Diagnosis not present

## 2020-05-22 DIAGNOSIS — M6281 Muscle weakness (generalized): Secondary | ICD-10-CM | POA: Diagnosis not present

## 2020-05-22 DIAGNOSIS — M24572 Contracture, left ankle: Secondary | ICD-10-CM | POA: Diagnosis not present

## 2020-05-22 DIAGNOSIS — J942 Hemothorax: Secondary | ICD-10-CM | POA: Diagnosis not present

## 2020-05-22 DIAGNOSIS — I639 Cerebral infarction, unspecified: Secondary | ICD-10-CM | POA: Diagnosis not present

## 2020-05-22 DIAGNOSIS — R269 Unspecified abnormalities of gait and mobility: Secondary | ICD-10-CM | POA: Diagnosis not present

## 2020-05-22 DIAGNOSIS — N1831 Chronic kidney disease, stage 3a: Secondary | ICD-10-CM | POA: Diagnosis not present

## 2020-05-22 DIAGNOSIS — I2510 Atherosclerotic heart disease of native coronary artery without angina pectoris: Secondary | ICD-10-CM | POA: Diagnosis not present

## 2020-05-22 DIAGNOSIS — I69398 Other sequelae of cerebral infarction: Secondary | ICD-10-CM | POA: Diagnosis not present

## 2020-05-22 DIAGNOSIS — M810 Age-related osteoporosis without current pathological fracture: Secondary | ICD-10-CM | POA: Diagnosis not present

## 2020-05-22 DIAGNOSIS — M24571 Contracture, right ankle: Secondary | ICD-10-CM | POA: Diagnosis not present

## 2020-05-22 DIAGNOSIS — M109 Gout, unspecified: Secondary | ICD-10-CM | POA: Diagnosis not present

## 2020-05-22 DIAGNOSIS — G894 Chronic pain syndrome: Secondary | ICD-10-CM | POA: Diagnosis not present

## 2020-05-22 DIAGNOSIS — R7401 Elevation of levels of liver transaminase levels: Secondary | ICD-10-CM | POA: Diagnosis not present

## 2020-05-25 DIAGNOSIS — N1831 Chronic kidney disease, stage 3a: Secondary | ICD-10-CM | POA: Diagnosis not present

## 2020-05-25 DIAGNOSIS — I1310 Hypertensive heart and chronic kidney disease without heart failure, with stage 1 through stage 4 chronic kidney disease, or unspecified chronic kidney disease: Secondary | ICD-10-CM | POA: Diagnosis not present

## 2020-05-25 DIAGNOSIS — R5381 Other malaise: Secondary | ICD-10-CM | POA: Diagnosis not present

## 2020-05-25 DIAGNOSIS — I639 Cerebral infarction, unspecified: Secondary | ICD-10-CM | POA: Diagnosis not present

## 2020-06-10 DIAGNOSIS — S36892A Contusion of other intra-abdominal organs, initial encounter: Secondary | ICD-10-CM | POA: Diagnosis not present

## 2020-07-10 DIAGNOSIS — S36892A Contusion of other intra-abdominal organs, initial encounter: Secondary | ICD-10-CM | POA: Diagnosis not present

## 2020-07-16 DEATH — deceased
